# Patient Record
Sex: Female | Born: 1945 | Race: White | Hispanic: No | Smoking: Former smoker
Health system: Southern US, Community
[De-identification: ages and names within clinical notes are randomized; demographics above are authoritative.]

## PROBLEM LIST (undated history)

## (undated) DIAGNOSIS — I714 Abdominal aortic aneurysm, without rupture, unspecified: Secondary | ICD-10-CM

## (undated) DIAGNOSIS — Z1231 Encounter for screening mammogram for malignant neoplasm of breast: Secondary | ICD-10-CM

## (undated) DIAGNOSIS — R0600 Dyspnea, unspecified: Secondary | ICD-10-CM

## (undated) DIAGNOSIS — L0390 Cellulitis, unspecified: Secondary | ICD-10-CM

## (undated) DIAGNOSIS — I872 Venous insufficiency (chronic) (peripheral): Secondary | ICD-10-CM

## (undated) DIAGNOSIS — J449 Chronic obstructive pulmonary disease, unspecified: Secondary | ICD-10-CM

## (undated) DIAGNOSIS — I480 Paroxysmal atrial fibrillation: Secondary | ICD-10-CM

## (undated) HISTORY — DX: Venous insufficiency (chronic) (peripheral): I87.2

## (undated) HISTORY — PX: VASCULAR SURGERY: SHX849

## (undated) HISTORY — DX: Chronic obstructive pulmonary disease, unspecified: J44.9

---

## 2000-08-23 ENCOUNTER — Encounter: Payer: Self-pay | Admitting: Emergency Medicine

## 2000-08-24 ENCOUNTER — Encounter: Payer: Self-pay | Admitting: Surgery

## 2000-08-24 ENCOUNTER — Inpatient Hospital Stay (HOSPITAL_COMMUNITY): Admission: EM | Admit: 2000-08-24 | Discharge: 2000-08-27 | Payer: Self-pay | Admitting: Emergency Medicine

## 2000-08-26 ENCOUNTER — Encounter: Payer: Self-pay | Admitting: Surgery

## 2010-12-29 MED ORDER — HYDROCODONE-ACETAMINOPHEN 7.5-750 MG PO TABS
ORAL_TABLET | Freq: Three times a day (TID) | ORAL | Status: DC | PRN
Start: 2010-12-29 — End: 2011-01-20
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
## 2011-01-26 MED ORDER — HYDROCODONE-ACETAMINOPHEN 7.5-750 MG PO TABS
ORAL_TABLET | Freq: Three times a day (TID) | ORAL | Status: DC | PRN
Start: 2011-01-26 — End: 2011-01-26

## 2011-01-29 MED ORDER — HYDROCODONE-ACETAMINOPHEN 7.5-750 MG PO TABS
ORAL_TABLET | Freq: Three times a day (TID) | ORAL | Status: DC | PRN
Start: 2011-01-29 — End: 2011-02-12

## 2011-01-29 NOTE — Progress Notes (Signed)
01/29/2011    Jean Ball    Last visit to Pain Management?  12-29-10    Any new medical conditions since your last visit?  no    If yes, explain:     Any new medications since your last visit? no  (See updated med summary sheet)    Describe your pain:  (Location, type, intensity, duration)  Left knee pain    Pain is a # 8 on the VAS Scale.    Comments:  Uses cane for ambulation  And wearing knee brace    When was the last time you took your pain medication?  vicodin THIS AM    BP 152/89   Pulse 70   Temp(Src) 98.2 ??F (36.8 ??C) (Oral)   Resp 16    [name], LPN     11/28/1094    Jean Ball    HISTORY OF PRESENT ILLNESS:  The patient presents today the [facility] 01/29/2011 for continued evaluation of:    Patient Active Problem List:     Medial meniscus tear, Left knee     Lateral meniscus tear, Left knee     Left knee DJD     Chondromalacia of patella, left     Sprain and strain of knee and leg          Medications: Current outpatient prescriptions:hydrocodone-acetaminophen (VICODIN ES) 7.5-750 MG per tablet, Take 1 tablet by mouth every 8 hours as needed., Disp: 90 tablet, Rfl: 0;  aspirin 325 MG tablet, Take 325 mg by mouth daily.  , Disp: , Rfl: ;  Multiple Vitamins-Minerals (ONE-A-DAY WOMENS 50 PLUS) TABS, Take 1 tablet by mouth daily.  , Disp: , Rfl: ;  Flaxseed, Linseed, (FLAX SEED OIL) 1000 MG CAPS, Take 1 tablet by mouth daily.  , Disp: , Rfl:         The patient???s Review of Systems, Family History, Medical History, and Social History were reviewed with the patient and other than the above changes listed there is no difference from the previous exam  12/29/10     Medications and allergies have been reviewed in detail with the patient.  There are no changes here either.  Please see the medication reconciliation sheet in the chart for full details of medications and allergies.    PHYSICAL EXAMINATION:    VITAL SIGNS: BP 152/89   Pulse 70   Temp(Src) 98.2 ??F (36.8 ??C) (Oral)   Resp  16  Neck: no adenopathy, no carotid bruit, no JVD, supple, symmetrical, trachea midline and thyroid not enlarged, symmetric, no tenderness/mass/nodules   Back: symmetric, no curvature. ROM normal. No CVA tenderness.   Lungs: clear to auscultation bilaterally   Heart: regular rate and rhythm, S1, S2 normal, no murmur, click, rub or gallop   Abdomen: soft, non-tender; bowel sounds normal; no masses, no organomegaly   Extremities: edema left knee, has hinged brace on left leg, is able to almost fully extend left leg but flexion of left knee is limited. tender to palpation in medial aspect left knee.   Pulses:2+ and symmetric   Neurologic: antalgic gait. Patient is alert and oriented times three. Motor:Motor exam is 5 out of 5 all extremities with the exception of left lower extremity.Sensation is intact to light touch throughout in the upper and lower extremities.         Patient Active Problem List:     Medial meniscus tear, Left knee     Lateral meniscus tear, Left knee  Left knee DJD     Chondromalacia of patella, left     Sprain and strain of knee and leg         PLAN:      Medications:    Orders Placed This Encounter   Medications   ??? hydrocodone-acetaminophen (VICODIN ES) 7.5-750 MG per tablet     Sig: Take 1 tablet by mouth every 8 hours as needed.     Dispense:  90 tablet     Refill:  0   1.    2.   3. Ongoing Care:  The patient???s chronic opioid agreement was reviewed and the patient shows no evidence of non-compliance.  We will see the patient in 1 month or sooner if the patient has any problems before that time.    Merriel Zinger D. Brianna Esson, D.O.

## 2011-02-24 MED ORDER — HYDROCODONE-ACETAMINOPHEN 7.5-750 MG PO TABS
ORAL_TABLET | Freq: Three times a day (TID) | ORAL | Status: DC | PRN
Start: 2011-02-24 — End: 2011-03-19

## 2011-02-24 NOTE — Progress Notes (Signed)
02/24/2011    Jean Ball    Last visit to Pain Management?  01/29/2011    Any new medical conditions since your last visit?  No new medical problems    If yes, explain:  n/a    Any new medications since your last visit? No new medications  (See updated med summary sheet)    Describe your pain:  (Location, type, intensity, duration)  Left knee    Pain is a 8 on the VAS Scale.    Do you feel your functional level is improved while on opioid medication?  Yes, about 30-40% better    Comments:  Ambulating with a straight cane, wearing left knee brace.  Denies any paresthesias    When was the last time you took your pain medication?  Last night, Vicodin      98.2 temp -72 pulse-16 Resp    179/90      L. Steinbeck R.N.     02/24/2011    Jean Ball    HISTORY OF PRESENT ILLNESS:  The patient presents today the [name] 02/24/2011 for continued evaluation of:    Patient Active Problem List:     Medial meniscus tear, Left knee     Lateral meniscus tear, Left knee     Left knee DJD     Chondromalacia of patella, left     Sprain and strain of knee and leg      Subjective  She is here for eveluation of hr chronic pain. 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  There is nothing laft for it othr than controllong the pain with medications.  She does tell me that she does do excercises every day.  She does have her knee in a brace and she uses a cane.  She denies any side effects with her medciations.  With out it she would not be able walk to the mail box    Medications: Current outpatient prescriptions:hydrocodone-acetaminophen (VICODIN ES) 7.5-750 MG per tablet, Take 1 tablet by mouth every 8 hours as needed for 30 days. DO NOT FILL UNTIL 02-28-11, Disp: 90 tablet, Rfl: 0;  aspirin 325 MG tablet, Take 325 mg by mouth daily.  , Disp: , Rfl: ;  Multiple Vitamins-Minerals (ONE-A-DAY WOMENS 50 PLUS) TABS, Take 1 tablet by mouth daily.  , Disp: , Rfl:   Flaxseed, Linseed, (FLAX SEED OIL)  1000 MG CAPS, Take 1 tablet by mouth daily.  , Disp: , Rfl:         The patient???s Review of Systems, Family History, Medical History, and Social History were reviewed with the patient and other than the above changes listed there is no difference from the previous exam  01/29/11     Medications and allergies have been reviewed in detail with the patient.  There are no changes here either.  Please see the medication reconciliation sheet in the chart for full details of medications and allergies.    PHYSICAL EXAMINATION:    VITAL SIGNS: BP 178/90   Pulse 72   Temp(Src) 98.2 ??F (36.8 ??C) (Tympanic)   Resp 16  Neck: no adenopathy, no carotid bruit, no JVD, supple, symmetrical, trachea midline and thyroid not enlarged, symmetric, no tenderness/mass/nodules   Back: symmetric, no curvature. ROM normal. No CVA tenderness.   Lungs: clear to auscultation bilaterally   Heart: regular rate and rhythm, S1, S2 normal, no murmur, click, rub or gallop   Abdomen: soft, non-tender;  bowel sounds normal; no masses, no organomegaly   Extremities: edema left knee, has hinged brace on left leg, is able to almost fully extend left leg but flexion of left knee is limited. tender to palpation in medial aspect left knee.   Pulses:2+ and symmetric   Neurologic: antalgic gait. Patient is alert and oriented times three. Motor:Motor exam is 5 out of 5 all extremities with the exception of left lower extremity.Sensation is intact to light touch throughout in the upper and lower extremities.             Patient Active Problem List:     Medial meniscus tear, Left knee     Lateral meniscus tear, Left knee     Left knee DJD     Chondromalacia of patella, left     Sprain and strain of knee and leg         PLAN:      Medications:    Orders Placed This Encounter   Medications   ??? hydrocodone-acetaminophen (VICODIN ES) 7.5-750 MG per tablet     Sig: Take 1 tablet by mouth every 8 hours as needed for 30 days. DO NOT FILL UNTIL 02-28-11     Dispense:  90  tablet     Refill:  0   1.    2.   3. Ongoing Care:  The patient???s chronic opioid agreement was reviewed and the patient shows no evidence of non-compliance.  We will see the patient in 1 month or sooner if the patient has any problems before that time.    Barnaby Rippeon D. Siriyah Ambrosius, D.O.

## 2011-03-06 LAB — CBC
HCT: 47.5 % (ref 34.0–48.0)
HGB: 16 g/dL — ABNORMAL HIGH (ref 11.5–15.5)
MCH: 32.4 pg (ref 26.0–35.0)
MCHC: 33.7 % (ref 32.0–34.5)
MCV: 96.3 fL (ref 80.0–99.9)
MPV: 12.7 fL — ABNORMAL HIGH (ref 7.0–12.0)
Platelets: 126 E9/L — ABNORMAL LOW (ref 130–450)
RBC: 4.94 E12/L (ref 3.50–5.50)
RDW: 13.4 fL (ref 11.5–15.0)
WBC: 5.8 E9/L (ref 4.5–11.5)

## 2011-03-06 LAB — LIPID PANEL
Cholesterol: 224 mg/dL — ABNORMAL HIGH (ref 0–199)
HDL: 48 mg/dL (ref >40.0)
LDL Calculated: 153 mg/dL — ABNORMAL HIGH (ref 0–99)
Triglycerides: 114 mg/dL (ref 0–149)

## 2011-03-06 LAB — COMPREHENSIVE METABOLIC PANEL
ALT: 19 U/L (ref 10–49)
AST: 25 U/L (ref 0–33)
Albumin: 4.1 g/dL (ref 3.2–4.8)
Alkaline Phosphatase: 99 U/L (ref 45–129)
BUN: 13 mg/dL (ref 9–23)
Bilirubin, Total: 0.6 mg/dL (ref 0.3–1.2)
CO2: 32 mmol/L — ABNORMAL HIGH (ref 20–31)
Calcium: 8.8 mg/dL (ref 8.6–10.5)
Chloride: 111 mmol/L — ABNORMAL HIGH (ref 99–109)
Creatinine: 0.6 mg/dL (ref 0.5–1.1)
Glucose: 87 mg/dL (ref 70–110)
Potassium: 4.3 mmol/L (ref 3.5–5.5)
Sodium: 145 mmol/L (ref 132–146)
Total Protein: 6.5 g/dL (ref 5.7–8.2)

## 2011-03-06 LAB — CA 125: CA 125: 7.1 U/mL (ref 0.0–30.1)

## 2011-03-06 LAB — GFR CALCULATED: Gfr Calculated: >60 mL/min/{1.73_m2} (ref >=60)

## 2011-03-06 LAB — PLATELET CONFIRMATION

## 2011-03-06 LAB — TSH: TSH: 1.321 u[IU]/mL (ref 0.350–5.000)

## 2011-03-07 LAB — VITAMIN D 25 HYDROXY: Vit D, 25-Hydroxy: 16 ng/mL — ABNORMAL LOW (ref 30–80)

## 2011-03-26 MED ORDER — HYDROCODONE-ACETAMINOPHEN 7.5-750 MG PO TABS
ORAL_TABLET | Freq: Three times a day (TID) | ORAL | Status: DC | PRN
Start: 2011-03-26 — End: 2011-04-01

## 2011-03-26 NOTE — Progress Notes (Signed)
03/26/2011    Jean Ball    Last visit to Pain Management?  02/24/2011    Any new medical conditions since your last visit?  No new medical problems    If yes, explain:  n/a    Any new medications since your last visit? No new medications  (See updated med summary sheet)    Describe your pain:  (Location, type, intensity, duration)  Left knee    Pain is a 9 on the VAS Scale.    Do you feel your functional level is improved while on opioid medication? Yes, "quite a bit"    Comments:  Ambulating with a staright cane wearing a knee brace.  Denies any paresthesias    When was the last time you took your pain medication?  Yesterday, Vicodin    BP 146/76   Pulse 72   Temp(Src) 98.1 ??F (36.7 ??C) (Oral)   Resp 16      L. Steinbeck RN         Bekki Tavenner    03/26/2011    HISTORY OF PRESENT ILLNESS:  The patient presents today the [name] today, 03/26/2011 for continued evaluation of   Patient Active Problem List   Diagnoses   ??? Medial meniscus tear, Left knee   ??? Lateral meniscus tear, Left knee   ??? Left knee DJD   ??? Chondromalacia of patella, left   ??? Sprain and strain of knee and leg   .    Subjective: See above note. The patient states it is aggravated with activity and controlled with the current pain regimen which includes Vicodin ES and is prescribed by this office. The patient denies any side effects or complications from the medications. The patient denies any loss of bowel or bladder, new neurological symptoms or interval weakness.    The patient???s Review of Systems, Family History, Medical History, and Social History were reviewed with the patient and other than the above changes listed there is no difference from the previous exam.       Medications and allergies have been reviewed in detail with the patient.  There are no changes here either.  Please see the medication reconciliation sheet in the chart for full details of medications and allergies.    PHYSICAL EXAMINATION:    VITAL SIGNS: BP  146/76   Pulse 72   Temp(Src) 98.1 ??F (36.7 ??C) (Oral)   Resp 16      Neck: no adenopathy, no carotid bruit, no JVD, supple, symmetrical, trachea midline and thyroid not enlarged, symmetric, no tenderness/mass/nodules  Back: symmetric, no curvature. ROM normal. No CVA tenderness.  Lungs: clear to auscultation bilaterally  Heart: regular rate and rhythm, S1, S2 normal, no murmur, click, rub or gallop  Abdomen: soft, non-tender; bowel sounds normal; no masses,  no organomegaly  Extremities: brace on left leg, edema left ankle and knee with tenderness to palpation  Pulses:2+ and symmetric   Neurologic: uses cane for ambulation. Patient is alert and oriented times three. Motor:Motor exam is 5 out of 5 all extremities with the exception of left lower extremity is 4/5. Sensation is intact to light touch throughout in the upper and lower extremities.     Patient Active Problem List:     Medial meniscus tear, Left knee     Lateral meniscus tear, Left knee     Left knee DJD     Chondromalacia of patella, left     Sprain and strain of knee and  leg         PLAN:       1.  Medications: The patient's opioid agreement was reviewed.The patient shows no evidence of non-compliance. We did provide the patient with a refill of   Orders Placed This Encounter   Medications   ??? hydrocodone-acetaminophen (VICODIN ES) 7.5-750 MG per tablet     Sig: Take 1 tablet by mouth every 8 hours as needed for 30 days. DO NOT FILL UNTIL 03-30-11     Dispense:  90 tablet     Refill:  0     2.   Imaging:  3.  Physical Therapy:  4.  Interventions:  5.  Referrals:  6.  Ongoing Care: We will see the patient in 1 month or sooner if they have any problems before that time. I encouraged the patient to call our office with any questions.    Monika Salk, RNCNP

## 2011-04-01 MED ORDER — HYDROCODONE-ACETAMINOPHEN 7.5-325 MG PO TABS
ORAL_TABLET | Freq: Three times a day (TID) | ORAL | Status: DC | PRN
Start: 2011-04-01 — End: 2011-04-21

## 2011-04-01 NOTE — Telephone Encounter (Signed)
[facility] [phone] called and stated patient worker comp will not cover > 500 acetaminophen per tablet.  Requesting alternative medication.  Spoke to [facility] NP and ok for [employer].  Pharmacy notified

## 2011-04-27 MED ORDER — HYDROCODONE-ACETAMINOPHEN 7.5-325 MG PO TABS
ORAL_TABLET | Freq: Three times a day (TID) | ORAL | Status: DC | PRN
Start: 2011-04-27 — End: 2011-05-21

## 2011-04-27 MED ORDER — DICLOFENAC SODIUM 1.5 % TD SOLN
1.5 % | Freq: Two times a day (BID) | TRANSDERMAL | Status: AC | PRN
Start: 2011-04-27 — End: 2011-05-27

## 2011-04-27 NOTE — Progress Notes (Signed)
04/27/2011    Jean Ball    Last visit to Pain Management?  03/26/2011    Any new medical conditions since your last visit?  No new medical problems    If yes, explain:  n/a    Any new medications since your last visit? No new medications  (See updated med summary sheet)    Describe your pain:  (Location, type, intensity, duration)  Left knee    Pain is a 8 on the VAS Scale.    Do you feel your functional level is improved while on opioid medication? "No, the other ones were better"    Comments:  Ambulating with a straight cane wearing a left knee brace    When was the last time you took your pain medication?  Today, Norco    BP 154/80   Pulse 68   Temp 98.3 ??F (36.8 ??C)   Resp 18      L. Steinbeck RN         HISTORY OF PRESENT ILLNESS:  The patient presents at the [facility] today, 04/27/2011, for continued evaluation of:  Patient Active Problem List   Diagnoses   ??? Medial meniscus tear, Left knee   ??? Lateral meniscus tear, Left knee   ??? Left knee DJD   ??? Chondromalacia of patella, left   ??? Sprain and strain of knee and leg       SUBJECTIVE: The patient complains of the above.  The patient denies any side effects or complications from the medications. The patient denies any loss of bowel or bladder, new neurological symptoms or interval weakness.    REVIEW OF SYSTEMS: Family History, Medical History, and Social History were reviewed with the patient and other than the above changes listed there is no difference from the previous exam.     Medications and allergies have been reviewed in detail with the patient.  There are no changes here either.  Please see the medication reconciliation sheet in the chart for full details of medications and allergies.    Current outpatient prescriptions:HYDROcodone-acetaminophen (NORCO) 7.5-325 MG per tablet, Take 1 tablet by mouth every 8 hours as needed for Pain for 30 days. DO NOT FILL UNTIL 05/01/2011., Disp: 90 tablet, Rfl: 0;  Diclofenac Sodium (PENNSAID)  1.5 % SOLN, Place 40 drops onto the skin 2 times daily as needed for 30 days., Disp: 3 Bottle, Rfl: 3;  aspirin 325 MG tablet, Take 325 mg by mouth daily.  , Disp: , Rfl:   Multiple Vitamins-Minerals (ONE-A-DAY WOMENS 50 PLUS) TABS, Take 1 tablet by mouth daily.  , Disp: , Rfl: ;  Flaxseed, Linseed, (FLAX SEED OIL) 1000 MG CAPS, Take 1 tablet by mouth daily.  , Disp: , Rfl:     PHYSICAL EXAMINATION:    VITAL SIGNS: BP 154/80   Pulse 68   Temp 98.3 ??F (36.8 ??C)   Resp 18    Neck: no adenopathy, no carotid bruit, no JVD, supple, symmetrical, trachea midline and thyroid not enlarged, symmetric, no tenderness/mass/nodules    Back: full range of motion without pain, no tenderness, no spasm, no curvature     Lungs: clear to auscultation bilaterally    Heart: regular rate and rhythm, S1, S2 normal, no murmur, click, rub or gallop    Abdomen: soft, non-tender; bowel sounds normal; no masses,  no organomegaly    Extremities: Tenderness to palpation along the medial aspect of left knee with crepitus noted upon flexion and extension. ROM  markedly decreased. Presents with brace on today    Pulses: Peripheral vascular pulses were palpable bilaterally.     Neurologic: Patient is alert and oriented times three. Cranial nerves 2-12 are grossly intact.    Psychiatric: normal perception    Motor: Muscle strength is 5/5 bilaterally in the upper and lower extremities. Sensation is intact to light touch throughout in the upper and lower extremities.     Palpation:    Facet loading of the cervical spine does not reproduce the patient's pain.  Cervical paraspinal musculature bilateral is not tender to palpation and without radiation..     Facet loading of the lumbar spine does not reproduce the patient's pain.  Lumbar paraspinal musculature bilateral is not tender to palpation and does not radiate.    Sacroiliac joints bilateral are negative for tenderness to palpation. bilateral upper trochanterics are negative for tenderness upon  palpation.     Patient Active Problem List   Diagnoses   ??? Medial meniscus tear, Left knee   ??? Lateral meniscus tear, Left knee   ??? Left knee DJD   ??? Chondromalacia of patella, left   ??? Sprain and strain of knee and leg        PLAN:                   1. Medications: The patient's opioid agreement was reviewed. The patient shows               no evidence of non-compliance. We did provide the patient with refills of                              Orders Placed This Encounter   Medications   ??? HYDROcodone-acetaminophen (NORCO) 7.5-325 MG per tablet     Sig: Take 1 tablet by mouth every 8 hours as needed for Pain for 30 days. DO NOT FILL UNTIL 05/01/2011.     Dispense:  90 tablet     Refill:  0   ??? Diclofenac Sodium (PENNSAID) 1.5 % SOLN     Sig: Place 40 drops onto the skin 2 times daily as needed for 30 days.     Dispense:  3 Bottle     Refill:  3   .           I did give her a sample bottle of Pennsaid to try along with information regarding how to apply               2.  Imaging:  N/A unless noted otherwise         3.  Physical Therapy:  N/A unless noted otherwise         4.  Interventions:  N/A unless noted otherwise         5.  Referrals:  N/A unless noted otherwise         6.  Ongoing Care:  We will see the patient in the office in 1 month or sooner if the patient has any problems before that time.  I                           encouraged them to call our office with any questions.  Electronically signed by Pleas Koch

## 2011-05-04 LAB — VITAMIN D 25 HYDROX, D2 & D3: Vitamin D2 And D3, Total: 49 ng/mL (ref 30–100)

## 2011-05-25 MED ORDER — HYDROCODONE-ACETAMINOPHEN 7.5-325 MG PO TABS
ORAL_TABLET | Freq: Three times a day (TID) | ORAL | Status: DC | PRN
Start: 2011-05-25 — End: 2011-06-28

## 2011-05-25 NOTE — Progress Notes (Signed)
05/25/2011    Jean Ball    Last visit to Pain Management?  04/27/2011    Any new medical conditions since your last visit?  yes    If yes, explain:  Has a cold    Any new medications since your last visit?  Taking OTC Contact, Formula 44 Cough, cough drops and Bayer Aspirin for her cold  (See updated med summary sheet)    Describe your pain:  (Location, type, intensity, duration)  Left knee    Pain is a 8 on the VAS Scale.    Do you feel your functional level is improved while on opioid medication? " Yes"    Comments:  Wearing a left knee brace, ambulating with a straight cane.  Complains of paresthesias below left knee    When was the last time you took your pain medication?  This AM, Norco    BP 154/68   Pulse 72   Temp(Src) 98.3 ??F (36.8 ??C) (Oral)   Resp 18      L. Steinbeck RN         HISTORY OF PRESENT ILLNESS:  The patient presents at the [facility]. [facility] today, 05/25/2011, for continued evaluation of:  Patient Active Problem List   Diagnoses   ??? Medial meniscus tear, Left knee   ??? Lateral meniscus tear, Left knee   ??? Left knee DJD   ??? Chondromalacia of patella, left   ??? Sprain and strain of knee and leg       SUBJECTIVE: The patient complains of the above.  The patient denies any side effects or complications from the medications. The patient denies any loss of bowel or bladder, new neurological symptoms or interval weakness.    REVIEW OF SYSTEMS: Family History, Medical History, and Social History were reviewed with the patient and other than the above changes listed there is no difference from the previous exam.     Medications and allergies have been reviewed in detail with the patient.  There are no changes here either.  Please see the medication reconciliation sheet in the chart for full details of medications and allergies.    Current outpatient prescriptions:HYDROcodone-acetaminophen (NORCO) 7.5-325 MG per tablet, Take 1 tablet by mouth every 8 hours as needed for Pain for 30 days. DO  NOT FILL UNTIL 05/30/2011., Disp: 90 tablet, Rfl: 0;  Diclofenac Sodium (PENNSAID) 1.5 % SOLN, Place 40 drops onto the skin 2 times daily as needed for 30 days., Disp: 3 Bottle, Rfl: 3;  aspirin 325 MG tablet, Take 325 mg by mouth daily.  , Disp: , Rfl:   Multiple Vitamins-Minerals (ONE-A-DAY WOMENS 50 PLUS) TABS, Take 1 tablet by mouth daily.  , Disp: , Rfl: ;  Flaxseed, Linseed, (FLAX SEED OIL) 1000 MG CAPS, Take 1 tablet by mouth daily.  , Disp: , Rfl:     PHYSICAL EXAMINATION:    VITAL SIGNS: BP 154/68   Pulse 72   Temp(Src) 98.3 ??F (36.8 ??C) (Oral)   Resp 18    Neck: no adenopathy, no carotid bruit, no JVD, supple, symmetrical, trachea midline and thyroid not enlarged, symmetric, no tenderness/mass/nodules    Back: full range of motion without pain, no tenderness, no spasm, no curvature     Lungs: clear to auscultation bilaterally    Heart: regular rate and rhythm, S1, S2 normal, no murmur, click, rub or gallop    Abdomen: soft, non-tender; bowel sounds normal; no masses,  no organomegaly    Extremities: edema  left knee  and presents with brace on left knee    Pulses: Peripheral vascular pulses were palpable bilaterally.     Neurologic: Patient is alert and oriented times three. Cranial nerves 2-12 are grossly intact.    Psychiatric: normal perception    Motor: Muscle strength is 5/5 bilaterally in the upper and lower extremities. Sensation is intact to light touch throughout in the upper and lower extremities.     Palpation:    Facet loading of the cervical spine does not reproduce the patient's pain.  Cervical paraspinal musculature bilateral is not tender to palpation and without radiation..     Facet loading of the lumbar spine does not reproduce the patient's pain.  Lumbar paraspinal musculature bilateral is not tender to palpation and does not radiate.    Sacroiliac joints bilateral are negative for tenderness to palpation. bilateral upper trochanterics are negative for tenderness upon palpation.      Patient Active Problem List   Diagnoses   ??? Medial meniscus tear, Left knee   ??? Lateral meniscus tear, Left knee   ??? Left knee DJD   ??? Chondromalacia of patella, left   ??? Sprain and strain of knee and leg        PLAN:                   1. Medications: The patient's opioid agreement was reviewed. The patient shows               no evidence of non-compliance. We did provide the patient with refills of                              Orders Placed This Encounter   Medications   ??? HYDROcodone-acetaminophen (NORCO) 7.5-325 MG per tablet     Sig: Take 1 tablet by mouth every 8 hours as needed for Pain for 30 days. DO NOT FILL UNTIL 05/30/2011.     Dispense:  90 tablet     Refill:  0   .                          2.  Imaging:  N/A unless noted otherwise         3.  Physical Therapy:  N/A unless noted otherwise         4.  Interventions:  N/A unless noted otherwise         5.  Referrals:  N/A unless noted otherwise         6.  Ongoing Care:  We will see the patient in the office in 1 month or sooner if the patient has any problems before that time.  I                           encouraged them to call our office with any questions.                                                                   Electronically signed by Nehemiah Settle

## 2011-06-29 MED ORDER — HYDROCODONE-ACETAMINOPHEN 7.5-325 MG PO TABS
ORAL_TABLET | Freq: Three times a day (TID) | ORAL | Status: DC | PRN
Start: 2011-06-29 — End: 2011-08-04

## 2011-06-29 NOTE — Progress Notes (Addendum)
06/29/2011    Jean Ball    Patients last visit to Pain Management was  05/25/11.  States no  new medical conditions/changes since last visit.  Complaints of pain to left knee with swelling to left knee and foot .  States pain level is 8.   No medication changes.  Took Norco this am.  States when applies Pennsaid to left knee states itches to knee and bilateral lower leg.  States last couple hours. Denies hives or difficulty breathing    Do you feel your functional level is improved while on opioid medication? yes      BP 168/80  Pulse 74  Temp(Src) 98.3 F (36.8 C) (Oral)  Resp 18     HISTORY OF PRESENT ILLNESS:  The patient presents at the [facility]. [facility] today, 06/29/2011, for continued evaluation of:  Patient Active Problem List   Diagnoses   . Medial meniscus tear, Left knee   . Lateral meniscus tear, Left knee   . Left knee DJD   . Chondromalacia of patella, left   . Sprain and strain of knee and leg       SUBJECTIVE: The patient complains of the above.  The patient denies any side effects or complications from the medications. The patient denies any loss of bowel or bladder, new neurological symptoms or interval weakness.  She would like to see Dr Christoper Allegra in [place] for a 2nd opinion on her left knee. We will submit a C-9 for this.She is in constant pain.  REVIEW OF SYSTEMS: Family History, Medical History, and Social History were reviewed with the patient and other than the above changes listed there is no difference from the previous exam.     Medications and allergies have been reviewed in detail with the patient.  There are no changes here either.  Please see the medication reconciliation sheet in the chart for full details of medications and allergies.    Current outpatient prescriptions:HYDROcodone-acetaminophen (NORCO) 7.5-325 MG per tablet, Take 1 tablet by mouth every 8 hours as needed for Pain for 30 days., Disp: 90 tablet, Rfl: 0;  Diclofenac Sodium (PENNSAID) 1.5 %  SOLN, Place 40 drops onto the skin 2 times daily as needed for 30 days., Disp: 3 Bottle, Rfl: 3;  aspirin 325 MG tablet, Take 325 mg by mouth daily.  , Disp: , Rfl:   Multiple Vitamins-Minerals (ONE-A-DAY WOMENS 50 PLUS) TABS, Take 1 tablet by mouth daily.  , Disp: , Rfl: ;  Flaxseed, Linseed, (FLAX SEED OIL) 1000 MG CAPS, Take 1 tablet by mouth daily.  , Disp: , Rfl:     PHYSICAL EXAMINATION:    VITAL SIGNS: BP 168/80  Pulse 74  Temp(Src) 98.3 F (36.8 C) (Oral)  Resp 18    Neck: no adenopathy, no carotid bruit, no JVD, supple, symmetrical, trachea midline and thyroid not enlarged, symmetric, no tenderness/mass/nodules    Back: full range of motion without pain, no tenderness, no spasm, no curvature     Lungs: clear to auscultation bilaterally    Heart: regular rate and rhythm, S1, S2 normal, no murmur, click, rub or gallop    Abdomen: soft, non-tender; bowel sounds normal; no masses,  no organomegaly    Extremities: left knee has surgical scarring, swelling, tenderness to palpation over the patella, presents with brace. Ambulates with can assistance.    Pulses: Peripheral vascular pulses were palpable bilaterally.     Neurologic: Patient is alert and oriented times three. Cranial nerves 2-12  are grossly intact.    Psychiatric: normal perception    Motor: Muscle strength is 5/5 bilaterally in the upper and lower extremities. Sensation is intact to light touch throughout in the upper and lower extremities.     Palpation:    Facet loading of the cervical spine does not reproduce the patient's pain.  Cervical paraspinal musculature bilateral is not tender to palpation and without radiation..     Facet loading of the lumbar spine does not reproduce the patient's pain.  Lumbar paraspinal musculature bilateral is not tender to palpation and does not radiate.    Sacroiliac joints bilateral are negative for tenderness to palpation. bilateral upper trochanterics are negative for tenderness upon palpation.     Patient  Active Problem List   Diagnoses   . Medial meniscus tear, Left knee   . Lateral meniscus tear, Left knee   . Left knee DJD   . Chondromalacia of patella, left   . Sprain and strain of knee and leg        PLAN:                   1. Medications: The patient's opioid agreement was reviewed. The patient shows               no evidence of non-compliance. We did provide the patient with refills of                              Orders Placed This Encounter   Medications   . HYDROcodone-acetaminophen (NORCO) 7.5-325 MG per tablet     Sig: Take 1 tablet by mouth every 8 hours as needed for Pain for 30 days.     Dispense:  90 tablet     Refill:  0   .          She was told to stop the Pennsaid due to the itching.                2.  Imaging:  N/A unless noted otherwise         3.  Physical Therapy:  N/A unless noted otherwise         4.  Interventions:  N/A unless noted otherwise         5.  Referrals:  C-9 for referral to Dr Christoper Allegra (ortho in [place])         6.  Ongoing Care:  We will see the patient in the office in 1 month or sooner if the patient has any problems before that time.  I                           encouraged them to call our office with any questions.                                                                   Electronically signed by Nehemiah Settle

## 2011-07-29 NOTE — Progress Notes (Signed)
I faxed all information to Dr. Theodosia Paling office, appointment was made for October 5 th at 10:15am.  (P) [phone], (F) [phone].  Patient must first have a X-Ray of (L) knee done.  Patient was notified of all this and is having X-Ray done at [facility]. [facility].

## 2011-08-04 LAB — HEPATIC FUNCTION PANEL
ALT: 19 U/L (ref 10–49)
AST: 20 U/L (ref 0–33)
Albumin: 4.5 g/dL (ref 3.2–4.8)
Alkaline Phosphatase: 84 U/L (ref 45–129)
Bilirubin, Direct: 0.2 mg/dL (ref 0.0–0.2)
Total Bilirubin: 0.5 mg/dL (ref 0.3–1.2)
Total Protein: 6.8 g/dL (ref 5.7–8.2)

## 2011-08-04 LAB — CREATININE: Creatinine: 0.7 mg/dL (ref 0.5–1.1)

## 2011-08-04 LAB — ETHANOL: Ethanol Lvl: <10 mg/dL

## 2011-08-04 LAB — GFR CALCULATED: Gfr Calculated: >60 mL/min/{1.73_m2} (ref >=60)

## 2011-08-04 LAB — BUN: BUN: 17 mg/dL (ref 9–23)

## 2011-08-04 MED ORDER — HYDROCODONE-ACETAMINOPHEN 7.5-325 MG PO TABS
ORAL_TABLET | Freq: Three times a day (TID) | ORAL | Status: DC | PRN
Start: 2011-08-04 — End: 2011-08-25

## 2011-08-04 NOTE — Progress Notes (Signed)
08/04/2011    Jean Ball    Last visit to Pain Management?  06-29-11    Any new medical conditions since your last visit?  Yes,left cataract surgery 07-28-11    If yes, explain:  needs left knee x-ray taken prior to consult with DR.Stahlbergs  08-28-11 @ [facility]    Any new medications since your last visit? no  (See updated med summary sheet)    Describe your pain:  (Location, type, intensity, duration)  Left knee pain also into left calf and foot    Pain is a # 6 on the VAS Scale.    Do you feel your functional level is improved while on opioid medication? yes    Comments:  Uses cane     When was the last time you took your pain medication?  This am    BP 154/82  Pulse 68  Temp 98.1 F (36.7 C)  Resp 16    LP LPN

## 2011-08-04 NOTE — Progress Notes (Signed)
Jean Ball    08/04/2011    [employer]    HISTORY OF PRESENT ILLNESS:  Jean Ball comes to the [facility] today, 08/04/2011 for continued evaluation of the aforementioned chief complaint.  The patient denies interval weakness, new bowel or bladder complaints or any new neurologic complaints.     Jean Ball continues to report constant aching pain in the left knee.  She reports the pain radiates into the distal medial left extremity. The pain is aggravated with activity and alleviated with the current medication regimen. The patient continues to note improved quality of life and level of function with this regimen. She denies any other associated symptoms.    07/29/2011 per Gregary Cromer, Secretary:  I faxed all information to Dr. Theodosia Paling office, appointment was made for October 5 th at 10:15am.  (P) [phone], (F) [phone].  Patient must first have a X-Ray of (L) knee done.  Patient was notified of all this and is having X-Ray done at [facility]. [facility].    Jean Ball was last seen on 06/29/2011 at which time she complained of pain to the left knee and swelling to the left knee and and left foot .  She states when she applied Pennsaid to the left knee it caused itching to the knee and bilateral lower leg which lasted a couple of hours. She denied hives or difficulty breathing.  She was told to discontinue the Pennsaid.  She also requested to see Dr. Christoper Allegra in [place] for a 2nd opinion on her left knee.  We will submitted a C-9 for this.    PERTINENT FAMILY HISTORY:    Her family history includes Cancer in an unspecified family member.    PAST MEDICAL HISTORY:  Dan  has no past medical history on file.    PAST SURGICAL HISTORY:  The patient  has past surgical history that includes knee surgery; Total knee arthroplasty (2007); Total knee arthroplasty (2008); back surgery (1990); and Tubal ligation.     MEDICATIONS:  Current outpatient  prescriptions:HYDROcodone-acetaminophen (NORCO) 7.5-325 MG per tablet, Take 1 tablet by mouth every 8 hours as needed for Pain for 30 days., Disp: 90 tablet, Rfl: 0;  Diclofenac Sodium (PENNSAID) 1.5 % SOLN, Place 40 drops onto the skin 2 times daily as needed for 30 days., Disp: 3 Bottle, Rfl: 3;  aspirin 325 MG tablet, Take 325 mg by mouth daily.  , Disp: , Rfl:   Multiple Vitamins-Minerals (ONE-A-DAY WOMENS 50 PLUS) TABS, Take 1 tablet by mouth daily.  , Disp: , Rfl: ;  Flaxseed, Linseed, (FLAX SEED OIL) 1000 MG CAPS, Take 1 tablet by mouth daily.  , Disp: , Rfl:     ALLERGIES:  Sayde is allergic to keflex.    SOCIAL HISTORY:  Jean Ball  reports that she has been smoking Cigarettes.  She has a 30 pack-year smoking history. She does not have any smokeless tobacco history on file. She reports that she drinks alcohol. She reports that she does not use illicit drugs.    REVIEW OF SYSTEMS:   Syanna denies fever/chills, chest pain, shortness of breath, new bowel or bladder complaints or suicidal ideations. All other review of systems was negative.    VITAL SIGNS:   BP 154/82  Pulse 68  Temp 98.1 F (36.7 C)  Resp 16    PHYSICAL EXAMINATION:    General Descriptors:  A well appearing individual, sitting comfortably in chair on room entry in no acute distress.    PSYCH./MENTAL  STATUS:  The patient is awake and alert.  Displays appropriate mood and affect.  Denies suicidal ideation or intent.    HEENT: Head is normocephalic, atraumatic.  Eyes: Extraocular muscles are intact.  Cranial nerves 2-12 grossly intact.  Hearing is grossly intact.  Oral mucosal membranes are moist.      GAIT/GROSS MOTOR STATIONS:  The patient ambulates slowly with the assistance of a Straight Cane.      EXTREMITIES:  The left knee has surgical scarring.  There is swelling and tenderness to palpation over the patella.  ROM markedly decreased.  She presents with a brace.    LUNGS:  Normal respiratory efforts.  Clear to auscultation bilaterally.       CARDIOVASCULAR:  Regular rate and rhythm.      PERIPHERAL VASCULAR: No edema noted in the bilateral lower extremities.    ABDOMEN:  Soft, nontender.    INTEGUMENT: Appearance of exposed skin is unremarkable.    NEUROLOGICAL:  Sensation is intact to light touch in the upper and lower extremities bilaterally. Hoffmann's is negative bilaterally. Coordination intact.    MUSCULOSKELETAL:  Extremity strength is normal and full in all major muscle groups of the upper and lower extremities.  No focal areas of atrophy or tone abnormalities noted.      PALPATION:  No erythema or edema noted in left knee. Pain noted with palpation over the medial aspect of the left knee. Pain noted with flexion and extension of the left leg. Paraspinal muscle spasms noted in the lumbar region.    RANGE OF MOTION:    Patient was wearing a knee brace on the left leg. She did not remove this for exam today.    DIAGNOSIS:        Medial meniscus tear, Left knee     Lateral meniscus tear, Left knee     Left knee DJD     Chondromalacia of patella, left     Sprain and strain of knee and leg     PLAN:   I had a lengthy discussion with Harriett Sine today regarding possible therapeutic options. I continue to recommend a multidisciplinary approach to her chronic pain.   Margaurite has agreed to proceed as follows:     Physical Therapy: I recommend continued physical therapy to focus on core strengthening exercises, strength and flexibility exercises and a low velocity home exercise routine.   Counseling:    Psychological support was offered.   Natalia was counseled regarding the importance of regular exercise and a healthy diet in the role of improving pain symptoms.   Medications:    Clodagh's opiate agreement was reviewed. [place]ARRS was reviewed as well.  She shows no evidence of non-compliance.  Orders Placed This Encounter   Medications   . HYDROcodone-acetaminophen (NORCO) 7.5-325 MG per tablet     Sig: Take 1 tablet by mouth every 8 hours as needed for Pain  for 30 days.     Dispense:  90 tablet     Refill:  0     *I did order a urine drug screen today. Deshanda was given a prescription for lab work including liver enzymes, BUN, Creatinine and a random Urine Drug Screen.  She was instructed to go to the outpatient lab immediately after leaving the office.  She was also instructed that if the Urine Drug Screen is not completed today it my be grounds for dismissal from the opioid program.    I encouraged Tomasita to call with questions, concerns or worsening  of symptoms.  She will be seen again in the office in 1 month.    Gabriel Rung Amanda Pote, D.O.    Board Certified in Pain Management  Board Certified in Physical Medicine and Rehabilitation

## 2011-08-05 LAB — URINE DRUG SCREEN
Amphetamine Screen, Urine: NOT DETECTED ng/mL
Barbiturate Screen, Ur: NOT DETECTED ng/mL (ref ?–200)
Benzodiazepine Screen, Urine: NOT DETECTED ng/mL (ref ?–200)
Cannabinoids: NOT DETECTED ng/mL
Cocaine Metabolites, Ur: NOT DETECTED ng/mL (ref ?–300)
Methadone Screen, Urine: NOT DETECTED ng/mL (ref ?–300)
Opiate Scrn, Ur: POSITIVE ng/mL (ref ?–300)
PCP Screen, Urine: NOT DETECTED ng/mL
Propoxyphene, Urine: NOT DETECTED ng/mL (ref ?–300)

## 2011-08-06 LAB — TLC CONFIRMATION

## 2011-08-09 LAB — OPIATE, QUANTITATIVE, URINE

## 2011-08-09 LAB — MISCELLANEOUS SENDOUT

## 2011-09-01 NOTE — Telephone Encounter (Signed)
Patient had uds/labs done on 08/04/2011.  The results are appropriate for her current medications and the labs are within normal limits.

## 2011-09-03 MED ORDER — HYDROCODONE-ACETAMINOPHEN 7.5-325 MG PO TABS
ORAL_TABLET | Freq: Three times a day (TID) | ORAL | Status: DC | PRN
Start: 2011-09-03 — End: 2011-10-06

## 2011-09-03 NOTE — Progress Notes (Signed)
09/03/2011    Jean Ball    Last visit to Pain Management?  08/04/2011.    Any new medical conditions since your last visit?  no    If yes, explain:      Any new medications since your last visit? no  (See updated med summary sheet)    Describe your pain:  (Location, type, intensity, duration)  She is complaining of pain to her left knee.  She is using a straight cane to assist in ambulation.  She also is wearing a knee brace.  Her gait is slow and steady.    Pain is a 7 on the VAS Scale.    Do you feel your functional level is improved while on opioid medication? yes    Comments:  She had a uds/labs last month    When was the last time you took your pain medication?  norco this morning    BP 154/76  Pulse 80  Temp(Src) 98.5 F (36.9 C) (Oral)  Resp 16               Jean Ball    09/03/2011    HISTORY OF PRESENT ILLNESS:  The patient presents today the [name] today, 09/03/2011 for continued evaluation of   Patient Active Problem List   Diagnoses   . Medial meniscus tear, Left knee   . Lateral meniscus tear, Left knee   . Left knee DJD   . Chondromalacia of patella, left   . Sprain and strain of knee and leg   .    Subjective: See above note. The patient states it is aggravated with activity and controlled with the current pain regimen which includes Norco and is prescribed by this office. The patient denies any side effects or complications from the medications. The patient denies any loss of bowel or bladder, new neurological symptoms or interval weakness. The patient states the Norco does allow her to function at home e.g cooking and some light housework. She continues to do the home band exercises daily for the left knee.    The patient's Review of Systems, Family History, Medical History, and Social History were reviewed with the patient and other than the above changes listed there is no difference from the previous exam.       Medications and allergies have been reviewed in  detail with the patient.  There are no changes here either.  Please see the medication reconciliation sheet in the chart for full details of medications and allergies.    PHYSICAL EXAMINATION:    VITAL SIGNS: BP 154/76  Pulse 80  Temp(Src) 98.5 F (36.9 C) (Oral)  Resp 16  Uses cane for ambulatory assistance.  Neck: normal C-spine, no tenderness, FROM without pain, normal neurological exam of arms; normal DTRs, motor, sensory exam  Back: full range of motion without pain, no tenderness, no spasm, no curvature  Lungs: clear to auscultation bilaterally  Heart: regular rate and rhythm, S1, S2 normal, no murmur, click, rub or gallop  Abdomen: soft, non-tender; bowel sounds normal; no masses,  no organomegaly  Extremities: brace to left knee, tender to palpation in distal aspect of knee especially the medial aspect with decreased sensation in the lateral aspect of the joint  Pulses:2+ and symmetric   Neurologic: Patient is alert and oriented times three. negativespeech normal, mental status intact, cranial nerves 2-12 intact  Motor:Motor exam is 5 out of 5 all extremities with the exception of decreased strength in left  knee with decreased flexion and extension of knee.  Provacative Maneuvers:    Patient Active Problem List:     Medial meniscus tear, Left knee     Lateral meniscus tear, Left knee     Left knee DJD     Chondromalacia of patella, left     Sprain and strain of knee and leg       PLAN:       1.  Medications: The patient's opioid agreement was reviewed.The patient shows no evidence of non-compliance. We did provide the patient with a refill of   Orders Placed This Encounter   Medications   . HYDROcodone-acetaminophen (NORCO) 7.5-325 MG per tablet     Sig: Take 1 tablet by mouth every 8 hours as needed for Pain for 30 days.     Dispense:  90 tablet     Refill:  0     2.   Imaging: none needed today  3.  Physical Therapy: home exercises encouraged   4.  Interventions: no procedures needed at this time  5.   Referrals:none needed at this time  6.  Ongoing Care: We will see the patient in 1 month or sooner if they have any problems before that time. I encouraged the patient to call our office with any questions.    Monika Salk, RNCNP

## 2011-10-06 MED ORDER — HYDROCODONE-ACETAMINOPHEN 7.5-325 MG PO TABS
ORAL_TABLET | Freq: Three times a day (TID) | ORAL | Status: DC | PRN
Start: 2011-10-06 — End: 2011-10-27

## 2011-10-06 NOTE — Progress Notes (Signed)
10/06/2011    Jean Ball    Last visit to Pain Management?  09-03-11    Any new medical conditions since your last visit?  denies    If yes, explain:  no    Any new medications since your last visit? [employer]   (See updated med summary sheet)    Describe your pain:  (Location, type, intensity, duration) left knee. She is using a straight cane to assist in ambulation. She also is wearing a knee brace. Her gait is slow and steady.      Pain is a 7 on the VAS Scale.    Do you feel your functional level is improved while on opioid medication? Yes     Comments:  States she takes Tylenol or Aleve with the Norco.    When was the last time you took your pain medication?  Norco, 1AM    BP 180/70   Pulse 70   Temp(Src) 98.4 ??F (36.9 ??C) (Oral)   Resp 18    C. Mechele Collin, RN           Jean Ball    10/06/2011    HISTORY OF PRESENT ILLNESS:  The patient presents today the [name] today, 10/06/2011 for continued evaluation of   Patient Active Problem List   Diagnoses   ??? Medial meniscus tear, Left knee   ??? Lateral meniscus tear, Left knee   ??? Left knee DJD   ??? Chondromalacia of patella, left   ??? Sprain and strain of knee and leg   .    Subjective: See above note. The patient states it is aggravated with activity and controlled with the current pain regimen which includes Norco and is prescribed by this office. The patient denies any side effects or complications from the medications. The patient denies any loss of bowel or bladder, new neurological symptoms or interval weakness. The patient states the meds continue to control the pain so that she can function at home.    The patient???s Review of Systems, Family History, Medical History, and Social History were reviewed with the patient and other than the above changes listed there is no difference from the previous exam.       Medications and allergies have been reviewed in detail with the patient.  There are no changes here either.  Please see the  medication reconciliation sheet in the chart for full details of medications and allergies.    PHYSICAL EXAMINATION:    VITAL SIGNS: BP 180/70   Pulse 70   Temp(Src) 98.4 ??F (36.9 ??C) (Oral)   Resp 18  Wearing brace to left knee and uses cane for ambulatory aid  Neck: normal C-spine, no tenderness, FROM without pain, normal neurological exam of arms; normal DTRs, motor, sensory exam  Back: full range of motion without pain, no tenderness, no spasm, no curvature  Lungs: clear to auscultation bilaterally  Heart: regular rate and rhythm, S1, S2 normal, no murmur, click, rub or gallop  Abdomen: soft, non-tender; bowel sounds normal; no masses,  no organomegaly  Extremities: edema left foot and lower extremity including knee  Pulses:2+ and symmetric   Neurologic: Patient is alert and oriented times three. negativespeech normal, mental status intact, cranial nerves 2-12 intact, muscle tone normal  Motor:Motor exam is 5 out of 5 all extremities with the exception of left lower extremity is 4/5.  Provacative Maneuvers:    Patient Active Problem List:     Medial meniscus tear, Left  knee     Lateral meniscus tear, Left knee     Left knee DJD     Chondromalacia of patella, left     Sprain and strain of knee and leg       PLAN:       1.  Medications: The patient's opioid agreement was reviewed.The patient shows no evidence of non-compliance. We did provide the patient with a refill of   Orders Placed This Encounter   Medications   ??? HYDROcodone-acetaminophen (NORCO) 7.5-325 MG per tablet     Sig: Take 1 tablet by mouth every 8 hours as needed for Pain for 30 days.     Dispense:  90 tablet     Refill:  0     2.   Imaging: none needed today  3.  Physical Therapy: home exercises encouraged   4.  Interventions: no procedures needed at this time  5.  Referrals:none needed at this time  6.  Ongoing Care: We will see the patient in 1 month or sooner if they have any problems before that time. I encouraged the patient to call our  office with any questions.    Monika Salk, RNCNP

## 2011-11-03 LAB — CBC
Hematocrit: 47.7 % (ref 34.0–48.0)
Hemoglobin: 16.1 g/dL — ABNORMAL HIGH (ref 11.5–15.5)
MCH: 32.1 pg (ref 26.0–35.0)
MCHC: 33.7 % (ref 32.0–34.5)
MCV: 95.2 fL (ref 80.0–99.9)
MPV: 12.6 fL — ABNORMAL HIGH (ref 7.0–12.0)
Platelets: 137 E9/L (ref 130–450)
RBC: 5.01 E12/L (ref 3.50–5.50)
RDW: 13.3 fL (ref 11.5–15.0)
WBC: 6.6 E9/L (ref 4.5–11.5)

## 2011-11-03 LAB — LIPID PANEL
Cholesterol: 203 mg/dL — ABNORMAL HIGH (ref 0–199)
HDL: 40 mg/dL — AB (ref >40.0)
LDL Calculated: 143 mg/dL — ABNORMAL HIGH (ref 0–99)
Triglycerides: 100 mg/dL (ref 0–149)

## 2011-11-03 LAB — COMPREHENSIVE METABOLIC PANEL
ALT: 18 U/L (ref 10–49)
AST: 20 U/L (ref 0–33)
Albumin: 4.3 g/dL (ref 3.2–4.8)
Alkaline Phosphatase: 95 U/L (ref 45–129)
BUN: 18 mg/dL (ref 9–23)
CO2: 29 mmol/L (ref 20–31)
Calcium: 9.1 mg/dL (ref 8.6–10.5)
Chloride: 111 mmol/L — ABNORMAL HIGH (ref 99–109)
Creatinine: 0.6 mg/dL (ref 0.5–1.1)
Glucose: 82 mg/dL (ref 70–110)
Potassium: 3.6 mmol/L (ref 3.5–5.5)
Sodium: 144 mmol/L (ref 132–146)
Total Bilirubin: 0.8 mg/dL (ref 0.3–1.2)
Total Protein: 6.6 g/dL (ref 5.7–8.2)

## 2011-11-03 LAB — TSH: TSH: 0.371 u[IU]/mL (ref 0.350–5.000)

## 2011-11-03 LAB — VITAMIN D 25 HYDROX, D2 & D3: Vitamin D2 And D3, Total: 22 ng/mL — ABNORMAL LOW (ref 30–100)

## 2011-11-03 LAB — GFR CALCULATED: Gfr Calculated: >60 mL/min/{1.73_m2} (ref >=60)

## 2011-11-03 MED ORDER — IBUPROFEN 600 MG PO TABS
600 MG | ORAL_TABLET | Freq: Three times a day (TID) | ORAL | Status: DC | PRN
Start: 2011-11-03 — End: 2011-11-26

## 2011-11-03 MED ORDER — HYDROCODONE-ACETAMINOPHEN 7.5-325 MG PO TABS
ORAL_TABLET | Freq: Three times a day (TID) | ORAL | Status: DC | PRN
Start: 2011-11-03 — End: 2011-11-26

## 2011-11-03 NOTE — Progress Notes (Signed)
11/03/2011    Jean Ball    Last visit to Pain Management?  10-06-11    Any new medical conditions since your last visit?  no    If yes, explain:  no    Any new medications since your last visit? no  (See updated med summary sheet)    Describe your pain:  (Location, type, intensity, duration)  left knee. She is using a straight cane to assist in ambulation. She also is wearing a knee brace. Her gait is slow and steady.         Pain is a 8 on the VAS Scale.    Do you feel your functional level is improved while on opioid medication? Not as well as the Vicodin.    Comments:  Usually takes ASA or Ibuprofen with the Norco to help it work better.    When was the last time you took your pain medication?  Norco:  4AM    BP 168/70   Pulse 88   Temp(Src) 98.7 ??F (37.1 ??C) (Oral)   Resp 18    C. Mechele Collin, RN           HISTORY OF PRESENT ILLNESS:  The patient presents at the [facility] today, 11/03/2011, for continued evaluation of:  Patient Active Problem List   Diagnoses   ??? Medial meniscus tear, Left knee   ??? Lateral meniscus tear, Left knee   ??? Left knee DJD   ??? Chondromalacia of patella, left   ??? Sprain and strain of knee and leg       SUBJECTIVE: The patient complains of the above.  The patient denies any side effects or complications from the medications. The patient denies any loss of bowel or bladder, new neurological symptoms or interval weakness.  She continues to wear her brace on her left knee at all times unless she is sleeping in bed. She does note swelling of the knee and LE down to the foot at times, depending on activity level. At times the skin over the knee is warmer than normal.    REVIEW OF SYSTEMS: Family History, Medical History, and Social History were reviewed with the patient and other than the above changes listed there is no difference from the previous exam.     Medications and allergies have been reviewed in detail with the patient.  There are no changes here either.   Please see the medication reconciliation sheet in the chart for full details of medications and allergies.    Current outpatient prescriptions:HYDROcodone-acetaminophen (NORCO) 7.5-325 MG per tablet, Take 1 tablet by mouth every 8 hours as needed for Pain for 30 days., Disp: 90 tablet, Rfl: 0;  ibuprofen (IBU) 600 MG tablet, Take 1 tablet by mouth every 8 hours as needed for Pain for 30 days., Disp: 120 tablet, Rfl: 0;  OMEGA-3 KRILL OIL 300 MG CAPS, Take 1 tablet by mouth daily.  , Disp: , Rfl:   Diclofenac Sodium (PENNSAID) 1.5 % SOLN, Place 40 drops onto the skin 2 times daily as needed for 30 days., Disp: 3 Bottle, Rfl: 3;  aspirin 325 MG tablet, Take 325 mg by mouth daily.  , Disp: , Rfl: ;  Multiple Vitamins-Minerals (ONE-A-DAY WOMENS 50 PLUS) TABS, Take 1 tablet by mouth daily.  , Disp: , Rfl: ;  Flaxseed, Linseed, (FLAX SEED OIL) 1000 MG CAPS, Take 1 tablet by mouth daily.  , Disp: , Rfl:     PHYSICAL EXAMINATION:    VITAL  SIGNS: BP 168/70   Pulse 88   Temp(Src) 98.7 ??F (37.1 ??C) (Oral)   Resp 18    Head: normocephalic, atraumatic    Neck: no adenopathy, no carotid bruit, no JVD, supple, symmetrical, trachea midline and thyroid not enlarged, symmetric, no tenderness/mass/nodules    Back: full range of motion without pain, no tenderness, no spasm, no curvature kyphosis absent    Lungs: clear to auscultation bilaterally    Heart: regular rate and rhythm, S1, S2 normal, no murmur, click, rub or gallop    Abdomen: soft, non-tender; bowel sounds normal; no masses,  no organomegaly    Extremities: edema left knee and presents with brace on  pain, tenderness to touch, swelling, limited range of motion    Pulses: Peripheral vascular pulses were palpable bilaterally.     Neurologic: Patient is alert and oriented times three. Cranial nerves 2-12 are grossly intact.    Psychiatric:  cooperative     Motor: Muscle strength is 5/5 bilaterally in the upper and lower extremities. Sensation is intact to light touch  throughout in the upper and lower extremities.     Palpation:    Cervical paraspinal musculature bilateral is not tender to palpation and without radiation..     Thoracic paraspinal musculature bilateral is not tender to palpation.     Lumbar paraspinal musculature bilateral is  tender to palpation and does not radiate.    PROVOCATIVE MANEUVERS:   Facet Load:   Pain noted with facet loading of the lumbar spine to neither side    Sacroiliac joints bilateral are negative for tenderness to palpation. bilateral upper trochanterics are negative for tenderness upon palpation.     Patient Active Problem List   Diagnoses   ??? Medial meniscus tear, Left knee   ??? Lateral meniscus tear, Left knee   ??? Left knee DJD   ??? Chondromalacia of patella, left   ??? Sprain and strain of knee and leg        PLAN:                   1. Medications: The patient's opioid agreement was reviewed. The patient shows               no evidence of non-compliance. We did provide the patient with refills of                              Orders Placed This Encounter   Medications   ??? HYDROcodone-acetaminophen (NORCO) 7.5-325 MG per tablet     Sig: Take 1 tablet by mouth every 8 hours as needed for Pain for 30 days.     Dispense:  90 tablet     Refill:  0   ??? ibuprofen (IBU) 600 MG tablet     Sig: Take 1 tablet by mouth every 8 hours as needed for Pain for 30 days.     Dispense:  120 tablet     Refill:  0   .                          2.  Imaging:  N/A unless noted otherwise         3.  Physical Therapy:  N/A unless noted otherwise         4.  Interventions:  N/A unless noted otherwise         5.  Referrals:  N/A unless noted otherwise         6.  Ongoing Care:  We will see the patient in the office in 1 month or sooner if the patient has any problems before that time.  I                           encouraged them to call our office with any questions.                                                                   Electronically signed by Nehemiah Settle

## 2011-12-03 MED ORDER — HYDROCODONE-ACETAMINOPHEN 7.5-325 MG PO TABS
ORAL_TABLET | Freq: Three times a day (TID) | ORAL | Status: DC | PRN
Start: 2011-12-03 — End: 2011-12-28

## 2011-12-03 MED ORDER — IBUPROFEN 600 MG PO TABS
600 MG | ORAL_TABLET | Freq: Three times a day (TID) | ORAL | Status: DC | PRN
Start: 2011-12-03 — End: 2011-12-28

## 2011-12-03 NOTE — Progress Notes (Signed)
12/03/2011    Jean Ball    Last visit to Pain Management?  11/03/11    Any new medical conditions since your last visit?  no    If yes, explain:  no    Any new medications since your last visit? no  (See updated med summary sheet)    Describe your pain:  (Location, type, intensity, duration)  left knee. She is using a straight cane to assist in ambulation. She also is not wearing a knee brace as hinge is being repaired. Her gait is slow and steady.         Pain is a 7 on the VAS Scale.    Do you feel your functional level is improved while on opioid medication? yes    Comments:      When was the last time you took your pain medication?  Early this morning:  Norco and Ibuprofen    BP 140/70   Pulse 80   Temp(Src) 98.3 ??F (36.8 ??C) (Oral)   Resp 18    C. Mechele Collin, RN         12/03/2011    Jean Ball    HISTORY OF PRESENT ILLNESS:  The patient presents today the [name] 12/03/2011 for continued evaluation of:    Patient Active Problem List:     Medial meniscus tear, Left knee     Lateral meniscus tear, Left knee     Left knee DJD     Chondromalacia of patella, left     Sprain and strain of knee and leg      Subjective: she tells me that the cold weather is making the pain worse.  She does see dr. Yetta Barre, but he is not doing any type of injections at this time. She denies any side efffecst from her current medical regimen.  She tells emt hat the ibuprophen helps when she takeds it witht the ibuprophen    Medications: Current outpatient prescriptions:HYDROcodone-acetaminophen (NORCO) 7.5-325 MG per tablet, Take 1 tablet by mouth every 8 hours as needed for Pain for 30 days., Disp: 90 tablet, Rfl: 0;  ibuprofen (IBU) 600 MG tablet, Take 1 tablet by mouth every 8 hours as needed for Pain for 30 days., Disp: 120 tablet, Rfl: 0;  OMEGA-3 KRILL OIL 300 MG CAPS, Take 1 tablet by mouth daily.  , Disp: , Rfl:   Diclofenac Sodium (PENNSAID) 1.5 % SOLN, Place 40 drops onto the skin 2 times daily as needed  for 30 days., Disp: 3 Bottle, Rfl: 3;  aspirin 325 MG tablet, Take 325 mg by mouth daily.  , Disp: , Rfl: ;  Multiple Vitamins-Minerals (ONE-A-DAY WOMENS 50 PLUS) TABS, Take 1 tablet by mouth daily.  , Disp: , Rfl: ;  Flaxseed, Linseed, (FLAX SEED OIL) 1000 MG CAPS, Take 1 tablet by mouth daily.  , Disp: , Rfl:         The patient???s Review of Systems, Family History, Medical History, and Social History were reviewed with the patient and other than the above changes listed there is no difference from the previous exam 11/03/11    Medications and allergies have been reviewed in detail with the patient.  There are no changes here either.  Please see the medication reconciliation sheet in the chart for full details of medications and allergies.    PHYSICAL EXAMINATION:    VITAL SIGNS: BP 140/70   Pulse 80   Temp(Src) 98.3 ??F (36.8 ??C) (Oral)   Resp 18  Head: normocephalic, atraumatic   Neck: no adenopathy, no carotid bruit, no JVD, supple, symmetrical, trachea midline and thyroid not enlarged, symmetric, no tenderness/mass/nodules   Back: full range of motion without pain, no tenderness, no spasm, no curvature kyphosis absent   Lungs: clear to auscultation bilaterally   Heart: regular rate and rhythm, S1, S2 normal, no murmur, click, rub or gallop   Abdomen: soft, non-tender; bowel sounds normal; no masses, no organomegaly   Extremities: edema left knee and presents with brace on pain, tenderness to touch, swelling, limited range of motion   Pulses: Peripheral vascular pulses were palpable bilaterally.   Neurologic: Patient is alert and oriented times three. Cranial nerves 2-12 are grossly intact.   Psychiatric: cooperative   Motor: Muscle strength is 5/5 bilaterally in the upper and lower extremities. Sensation is intact to light touch throughout in the upper and lower extremities.   Palpation:   Cervical paraspinal musculature bilateral is not tender to palpation and without radiation..   Thoracic paraspinal  musculature bilateral is not tender to palpation.   Lumbar paraspinal musculature bilateral is tender to palpation and does not radiate.   PROVOCATIVE MANEUVERS:   Facet Load: Pain noted with facet loading of the lumbar spine to neither side   Sacroiliac joints bilateral are negative for tenderness to palpation. bilateral upper trochanterics are negative for tenderness upon palpation.       Patient Active Problem List:     Medial meniscus tear, Left knee     Lateral meniscus tear, Left knee     Left knee DJD     Chondromalacia of patella, left     Sprain and strain of knee and leg       PLAN:      Medications:    Orders Placed This Encounter   Medications   ??? HYDROcodone-acetaminophen (NORCO) 7.5-325 MG per tablet     Sig: Take 1 tablet by mouth every 8 hours as needed for Pain for 30 days.     Dispense:  90 tablet     Refill:  0   ??? ibuprofen (IBU) 600 MG tablet     Sig: Take 1 tablet by mouth every 8 hours as needed for Pain for 30 days.     Dispense:  120 tablet     Refill:  0     1.   2. Ongoing Care:  The patient???s chronic opioid agreement was reviewed and the patient shows no evidence of non-compliance.  We will see the patient in 1 month or sooner if the patient has any problems before that time.    Sergio Hobart D. Claiborne Stroble, D.O.

## 2012-01-04 MED ORDER — HYDROCODONE-ACETAMINOPHEN 7.5-325 MG PO TABS
ORAL_TABLET | Freq: Three times a day (TID) | ORAL | Status: DC | PRN
Start: 2012-01-04 — End: 2012-01-26

## 2012-01-04 MED ORDER — IBUPROFEN 600 MG PO TABS
600 MG | ORAL_TABLET | Freq: Three times a day (TID) | ORAL | Status: DC | PRN
Start: 2012-01-04 — End: 2012-01-04

## 2012-01-04 MED ORDER — IBUPROFEN 600 MG PO TABS
600 MG | ORAL_TABLET | Freq: Three times a day (TID) | ORAL | Status: DC | PRN
Start: 2012-01-04 — End: 2012-01-26

## 2012-01-04 NOTE — Progress Notes (Signed)
01/04/2012    Jean Ball    Last visit to Pain Management?  12/03/2011    Any new medical conditions since your last visit?  yes    If yes, explain:  Saw PCP regarding blood pressure and not feeling well.    Any new medications since your last visit? yes  (See updated med summary sheet)started on low dose blood pressure pill.  Will bring name and dose next visit.    Describe your pain:  (Location, type, intensity, duration)  She is complaining of pain to her left knee.  She ambulates with a brace on her left knee and the assist of a straight cane.  Her gait is slow and steady.    Pain is a 8 on the VAS Scale.    Do you feel your functional level is improved while on opioid medication? yes    Comments:  QLS 6=fair; falls score 6= level 2; zung 53 moderate depression-offer psych support    When was the last time you took your pain medication?  norco 0200 this morning    BP 134/62   Pulse 88   Temp(Src) 98.4 ??F (36.9 ??C) (Oral)   Resp 18            Truth Wolaver    01/04/2012    HISTORY OF PRESENT ILLNESS:  The patient presents today the [facility] today, 01/04/2012 for continued evaluation of   Patient Active Problem List   Diagnosis   ??? Medial meniscus tear, Left knee   ??? Lateral meniscus tear, Left knee   ??? Left knee DJD   ??? Chondromalacia of patella, left   ??? Sprain and strain of knee and leg   .    Subjective: See above note. The patient states it is aggravated with activity and controlled with the current pain regimen which includes Norco and ibuprofen and is prescribed by this office. The patient denies any side effects or complications from the medications. The patient denies any loss of bowel or bladder, new neurological symptoms or interval weakness. The patient states the meds allow her to function at home. Her Zung scale showed moderate depression and she admits that she sometimes "sits and cries for no reason" so I talked to her about seeing Dr. [name] for evaluation.    The patient???s  Review of Systems, Family History, Medical History, and Social History were reviewed with the patient and other than the above changes listed there is no difference from the previous exam.       Medications and allergies have been reviewed in detail with the patient.  There are no changes here either.  Please see the medication reconciliation sheet in the chart for full details of medications and allergies.    PHYSICAL EXAMINATION:    VITAL SIGNS: BP 134/62   Pulse 88   Temp(Src) 98.4 ??F (36.9 ??C) (Oral)   Resp 18  Uses cane  Neck: normal C-spine, no tenderness, FROM without pain, normal neurological exam of arms; normal DTRs, motor, sensory exam  Back: full range of motion without pain, no tenderness, no spasm, no curvature  Lungs: clear to auscultation bilaterally  Heart: regular rate and rhythm, S1, S2 normal, no murmur, click, rub or gallop  Abdomen: soft, non-tender; bowel sounds normal; no masses,  no organomegaly  Extremities: edema mild in left foot and ankle , wears brace on left knee  Pulses:2+ and symmetric   Neurologic: Patient is alert and oriented times three. positive  findings: abnormal muscle tone left leg, muscular weakness left legspeech normal, mental status intact, cranial nerves 2-12 intact  Motor:Motor exam is 5 out of 5 all extremities with the exception of left leg is weaker  Provacative Maneuvers:    Patient Active Problem List:     Medial meniscus tear, Left knee     Lateral meniscus tear, Left knee     Left knee DJD     Chondromalacia of patella, left     Sprain and strain of knee and leg       PLAN:       1.  Medications: The patient's opioid agreement was reviewed.The patient shows no evidence of non-compliance. We did provide the patient with a refill of   Orders Placed This Encounter   Medications   ??? DISCONTD: ibuprofen (IBU) 600 MG tablet     Sig: Take 1 tablet by mouth every 8 hours as needed for Pain for 30 days.     Dispense:  120 tablet     Refill:  0   ???  HYDROcodone-acetaminophen (NORCO) 7.5-325 MG per tablet     Sig: Take 1 tablet by mouth every 8 hours as needed for Pain for 30 days.     Dispense:  90 tablet     Refill:  0   ??? ibuprofen (IBU) 600 MG tablet     Sig: Take 1 tablet by mouth every 8 hours as needed for Pain for 30 days. Take with food     Dispense:  90 tablet     Refill:  0     2.   Imaging: none needed today  3.  Physical Therapy: home exercises encouraged   4.  Interventions: no procedures needed at this time  5.  Referrals:she will consider seeing Dr. [name] and will let Jean Ball know next month  6.  Ongoing Care: We will see the patient in 1 month or sooner if they have any problems before that time. I encouraged the patient to call our office with any questions.    Monika Salk, RNCNP

## 2012-01-22 LAB — VITAMIN D 25 HYDROX, D2 & D3: Vitamin D2 And D3, Total: 54 ng/mL (ref 30–100)

## 2012-02-02 MED ORDER — HYDROCODONE-ACETAMINOPHEN 7.5-325 MG PO TABS
ORAL_TABLET | Freq: Three times a day (TID) | ORAL | Status: DC | PRN
Start: 2012-02-02 — End: 2012-03-02

## 2012-02-02 MED ORDER — IBUPROFEN 600 MG PO TABS
600 MG | ORAL_TABLET | Freq: Three times a day (TID) | ORAL | Status: DC | PRN
Start: 2012-02-02 — End: 2012-03-02

## 2012-02-02 NOTE — Progress Notes (Signed)
02/02/2012    Jean Ball    Last visit to Pain Management?  01-04-12    Any new medical conditions since your last visit?  no    If yes, explain:  no    Any new medications since your last visit? no  (See updated med summary sheet)    Describe your pain:  (Location, type, intensity, duration)  left knee. She ambulates with a brace on her left knee and the assist of a straight cane. Her gait is slow and steady.            Pain is a 6 on the VAS Scale.    Do you feel your functional level is improved while on opioid medication? yes    Comments:      When was the last time you took your pain medication?  0430 Norco  And Ibuprofen    BP 124/62   Pulse 78   Temp(Src) 98.6 ??F (37 ??C) (Oral)   Resp 18     C. Mechele Collin, RN        [facility]    02/02/2012    HISTORY OF PRESENT ILLNESS:  The patient presents today the [facility] today, 02/02/2012 for continued evaluation of   Patient Active Problem List   Diagnosis   ??? Medial meniscus tear, Left knee   ??? Lateral meniscus tear, Left knee   ??? Left knee DJD   ??? Chondromalacia of patella, left   ??? Sprain and strain of knee and leg   .    Subjective: See above note. The patient states it is aggravated with activity and controlled with the current pain regimen which includes Norco and ibuprofen and is prescribed by this office. The patient denies any side effects or complications from the medications. The patient denies any loss of bowel or bladder, new neurological symptoms or interval weakness. The patient states the meds allow her to function at home. 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 [name].    The patient???s Review of Systems, Family History, Medical History, and Social History were reviewed with the patient and other than the above changes listed there is no difference from the previous exam.       Medications and allergies have been reviewed in detail with the patient.  There  are no changes here either.  Please see the medication reconciliation sheet in the chart for full details of medications and allergies.    PHYSICAL EXAMINATION:    VITAL SIGNS: BP 124/62   Pulse 78   Temp(Src) 98.6 ??F (37 ??C) (Oral)   Resp 18  Uses cane, wears brace on left leg  Neck: normal C-spine, no tenderness, FROM without pain, normal neurological exam of arms; normal DTRs, motor, sensory exam  Back: full range of motion without pain, no tenderness, no spasm, no curvature  Lungs: clear to auscultation bilaterally  Heart: regular rate and rhythm, S1, S2 normal, no murmur, click, rub or gallop  Abdomen: soft, non-tender; bowel sounds normal; no masses,  no organomegaly  Extremities: edema left lower extremity. tender in medial and lateral aspect left knee  Pulses:2+ and symmetric   Neurologic: Patient is alert and oriented times three. positive findings: abnormal muscle tone left lower extremtiy, muscular weakness left lower extremityspeech normal, mental status intact, cranial nerves 2-12 intact  Motor:Motor exam is 5 out of 5 all extremities with the exception of left  lower extremity  Provacative Maneuvers:    Patient Active Problem List:     Medial meniscus tear, Left knee     Lateral meniscus tear, Left knee     Left knee DJD     Chondromalacia of patella, left     Sprain and strain of knee and leg       PLAN:       1.  Medications: The patient's opioid agreement was reviewed.The patient shows no evidence of non-compliance. We did provide the patient with a refill of   Orders Placed This Encounter   Medications   ??? ibuprofen (IBU) 600 MG tablet     Sig: Take 1 tablet by mouth every 8 hours as needed for Pain for 30 days. Take with food   DO  NOT FILL UNTIL 02-03-12     Dispense:  90 tablet     Refill:  0   ??? HYDROcodone-acetaminophen (NORCO) 7.5-325 MG per tablet     Sig: Take 1 tablet by mouth every 8 hours as needed for Pain for 30 days. DO  NOT FILL UNTIL 02-03-12     Dispense:  90 tablet     Refill:  0      2.   Imaging: none needed today  3.  Physical Therapy: home exercises encouraged   4.  Interventions: no procedures needed at this time  5.  Referrals:to Dr. 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  Ongoing Care: We will see the patient in 1 month or sooner if they have any problems before that time. I encouraged the patient to call our office with any questions.    Monika Salk, RNCNP

## 2012-02-23 ENCOUNTER — Inpatient Hospital Stay: Admit: 2012-02-23 | Discharge: 2012-02-23 | Disposition: A | Discharge status: Home / Self Care

## 2012-02-23 MED ORDER — METHYLPREDNISOLONE 4 MG PO TBPK
4 MG | PACK | ORAL | Status: AC
Start: 2012-02-23 — End: 2012-02-29

## 2012-02-23 MED ORDER — PROMETHAZINE-CODEINE 6.25-10 MG/5ML PO SYRP
Freq: Four times a day (QID) | ORAL | Status: DC | PRN
Start: 2012-02-23 — End: 2013-01-23

## 2012-02-23 MED ORDER — AZITHROMYCIN 250 MG PO TABS
250 MG | PACK | ORAL | Status: AC
Start: 2012-02-23 — End: 2012-03-04

## 2012-02-23 MED ORDER — ALBUTEROL SULFATE HFA 108 (90 BASE) MCG/ACT IN AERS
108 (90 Base) MCG/ACT | Freq: Four times a day (QID) | RESPIRATORY_TRACT | Status: AC | PRN
Start: 2012-02-23 — End: 2012-03-18

## 2012-02-23 MED ADMIN — levalbuterol (XOPENEX) nebulizer solution 1.25 mg: RESPIRATORY_TRACT | @ 16:00:00 | NDC 63402051500

## 2012-02-23 MED FILL — LEVALBUTEROL HCL 1.25 MG/0.5ML IN NEBU: 1.25 MG/0.5ML | RESPIRATORY_TRACT | Qty: 1

## 2012-02-23 NOTE — Discharge Instructions (Signed)
Bronchitis  Bronchitis is the body's way of reacting to injury and/or infection (inflammation) of the bronchi. Bronchi are the air tubes that extend from the windpipe into the lungs. If the inflammation becomes severe, it may cause shortness of breath.  CAUSES   Inflammation may be caused by:   A virus.   Germs (bacteria).   Dust.   Allergens.   Pollutants and many other irritants.  The cells lining the bronchial tree are covered with tiny hairs (cilia). These constantly beat upward, away from the lungs, toward the mouth. This keeps the lungs free of pollutants. When these cells become too irritated and are unable to do their job, mucus begins to develop. This causes the characteristic cough of bronchitis. The cough clears the lungs when the cilia are unable to do their job. Without either of these protective mechanisms, the mucus would settle in the lungs. Then you would develop pneumonia.  Smoking is a common cause of bronchitis and can contribute to pneumonia. Stopping this habit is the single most important thing you can do to help yourself.  TREATMENT    Your caregiver may prescribe an antibiotic if the cough is caused by bacteria. Also, medicines that open up your airways make it easier to breathe. Your caregiver may also recommend or prescribe an expectorant. It will loosen the mucus to be coughed up. Only take over-the-counter or prescription medicines for pain, discomfort, or fever as directed by your caregiver.   Removing whatever causes the problem (smoking, for example) is critical to preventing the problem from getting worse.   Cough suppressants may be prescribed for relief of cough symptoms.   Inhaled medicines may be prescribed to help with symptoms now and to help prevent problems from returning.   For those with recurrent (chronic) bronchitis, there may be a need for steroid medicines.  SEEK IMMEDIATE MEDICAL CARE IF:    During treatment, you develop more pus-like mucus (purulent  sputum).   You have a fever.   Your baby is older than 3 months with a rectal temperature of 102 F (38.9 C) or higher.   Your baby is 68 months old or younger with a rectal temperature of 100.4 F (38 C) or higher.   You become progressively more ill.   You have increased difficulty breathing, wheezing, or shortness of breath.  It is necessary to seek immediate medical care if you are elderly or sick from any other disease.  MAKE SURE YOU:    Understand these instructions.   Will watch your condition.   Will get help right away if you are not doing well or get worse.  Document Released: 11/09/2005 Document Revised: 10/29/2011 Document Reviewed: 09/18/2008  [facility] Patient Information 2012 [place].  Sinusitis  Sinuses are air pockets within the bones of your face. The growth of bacteria within a sinus leads to infection. The infection prevents the sinuses from draining. This infection is called sinusitis.  SYMPTOMS   There will be different areas of pain depending on which sinuses have become infected.   The maxillary sinuses often produce pain beneath the eyes.   Frontal sinusitis may cause pain in the middle of the forehead and above the eyes.  Other problems (symptoms) include:   Toothaches.   Colored, pus-like (purulent) drainage from the nose.   Swelling, warmth, and tenderness over the sinus areas may be signs of infection.  TREATMENT   Sinusitis is most often determined by an exam.X-rays may be taken. If x-rays have  been taken, make sure you obtain your results or find out how you are to obtain them. Your caregiver may give you medications (antibiotics). These are medications that will help kill the bacteria causing the infection. You may also be given a medication (decongestant) that helps to reduce sinus swelling.   HOME CARE INSTRUCTIONS    Only take over-the-counter or prescription medicines for pain, discomfort, or fever as directed by your caregiver.   Drink extra fluids. Fluids  help thin the mucus so your sinuses can drain more easily.   Applying either moist heat or ice packs to the sinus areas may help relieve discomfort.   Use saline nasal sprays to help moisten your sinuses. The sprays can be found at your local drugstore.  SEEK IMMEDIATE MEDICAL CARE IF:   You have a fever.   You have increasing pain, severe headaches, or toothache.   You have nausea, vomiting, or drowsiness.   You develop unusual swelling around the face or trouble seeing.  MAKE SURE YOU:    Understand these instructions.   Will watch your condition.   Will get help right away if you are not doing well or get worse.  Document Released: 11/09/2005 Document Revised: 10/29/2011 Document Reviewed: 06/08/2007  [facility] Patient Information 2012 [place].  Bronchospasm, Adult  Bronchospasm means that there is a spasm or tightening of the airways going into the lungs. Because the airways go into a spasm and get smaller it makes breathing more difficult.  For reasons not completely known, workings (functions) of the airways designed to protect the lungs become over active. This causes the airways to become more sensitive to:   Infection.   Weather.   Exercise.   Irritants.   Things that cause allergic reactions or allergies (allergens).  Frequent coughing or respiratory episodes should be checked for the cause. This condition may be made worse by exercise.  CAUSES   Inflammation is often the cause of this condition. Allergy, viral respiratory infections, or irritants in the air often cause this problem. Allergic reactions produce immediate and delayed responses. Late reactions may produce more serious inflammation. This may lead to increased reactivity of the airways. Sometimes this is inherited.  Some common triggers are:   Allergies.   Infection commonly triggers attacks. Antibiotics are not helpful for viral infections and usually do not help with attacks of bronchospasm.   Exercise (running, etc.)  can trigger an attack. Proper pre-exercise medications help most individuals participate in sports. Swimming is the least likely sport to cause problems.   Irritants (for example, pollution, cigarette smoke, strong odors, aerosol sprays, paint fumes, etc.) may trigger attacks. You cannot smoke and do not allow smoking in your home. This is absolutely necessary. Show this instruction to mates, relatives and significant others that may not agree with you.   Weather changes may cause lung problems but moving around trying to find an ideal climate does not seem to be overly helpful. Winds increase molds and pollens in the air. Rain refreshes the air by washing irritants out. Cold air may cause irritation.   Emotional problems do not cause lung problems but can trigger attacks.  SYMPTOMS   Wheezing is the most common symptom. Frequent coughing (with or without exercise and or crying) and repeated respiratory infections are all early warning signs of bronchospasm. Chest tightness and shortness of breath are other symptoms.  DIAGNOSIS   Early hidden bronchospasm may go for long periods of time without being detected. This is especially  true if wheezing cannot be detected by your caregiver. Lung (pulmonary) function studies may help with diagnosis in these cases.  HOME CARE INSTRUCTIONS    It is necessary to remain calm during an attack. Try to relax and breathe more slowly. During this time medications may be given. If any breathing problems seem to be getting worse and are unresponsive to treatment seek immediate medical care.   If you have severe breathing difficulty or have had a life threatening attack it is probably a good idea for you to learn how to give adrenaline (epi-pen) or use an anaphylaxis kit. Your caregiver can help you with this. These are the same kits carried by people who have severe allergic reactions. This is especially important if you do not have readily accessible medical care.   With any  severe breathing problems where epinephrine (adrenaline) has been given at home call 911 immediately as the delayed reaction may be even more severe.  SEEK MEDICAL CARE IF:    There is wheezing and shortness of breath, even if medications are given to prevent attacks.   An oral temperature above 102 F (38.9 C) develops.   There are muscle aches, chest pain, or thickening of sputum.   The sputum changes from clear or white to yellow, green, gray, or bloody.   There are problems that may be related to the medicine you are given, such as a rash, itching, swelling, or trouble breathing.  SEEK IMMEDIATE MEDICAL CARE IF:    The usual medicines do not stop your wheezing, or there is increased coughing.   You have increased difficulty breathing.  MAKE SURE YOU:    Understand these instructions.   Will watch your condition.   Will get help right away if you are not doing well or get worse.  Document Released: 11/12/2003 Document Revised: 10/29/2011 Document Reviewed: 06/27/2008  [facility] Patient Information 2012 [place].

## 2012-02-23 NOTE — ED Notes (Signed)
Adm to rm 2 for exam    Rondell Reams, RN  02/23/12 [phone]

## 2012-02-23 NOTE — ED Notes (Signed)
Pt completed aerosol treatment and states she feels somewhat better. Pulse Ox 96 pulse 86 resp 22    Rondell Reams, RN  02/23/12 1226

## 2012-02-23 NOTE — ED Provider Notes (Signed)
Patient is a 66 y.o. female presenting with URI. The history is provided by the patient.   URI  The primary symptoms include fever, fatigue, sore throat, swollen glands, cough and wheezing. Primary symptoms do not include headaches, ear pain, abdominal pain, nausea, vomiting, arthralgias or rash. The current episode started more than 1 week ago. This is a new problem. The problem has been gradually worsening.   The sore throat is not accompanied by trouble swallowing.   The swelling is not associated with trouble swallowing.   Symptoms associated with the illness include congestion (productive of green mucous). The illness is not associated with chills, plugged ear sensation, facial pain, sinus pressure or rhinorrhea. The following treatments were addressed: Acetaminophen was not tried. A decongestant was not tried. Aspirin was not tried. NSAIDs were not tried. Risk factors for severe complications from URI include being elderly and chronic respiratory disease.       Review of Systems   Constitutional: Positive for fever and fatigue. Negative for chills.   HENT: Positive for congestion (productive of green mucous), sore throat and postnasal drip. Negative for ear pain, nosebleeds, rhinorrhea, mouth sores, trouble swallowing, voice change and sinus pressure.    Eyes: Negative for pain, discharge and redness.   Respiratory: Positive for cough, shortness of breath and wheezing.    Cardiovascular: Negative.    Gastrointestinal: Negative.  Negative for nausea, vomiting and abdominal pain.   Genitourinary: Negative for dysuria and frequency.   Musculoskeletal: Negative for back pain and arthralgias.   Skin: Negative for rash and wound.   Neurological: Negative for weakness and headaches.   Hematological: Negative for adenopathy.   All other systems reviewed and are negative.        Physical Exam   Nursing note and vitals reviewed.  Constitutional: She is oriented to person, place, and time. She appears well-developed  and well-nourished.   HENT:   Head: Normocephalic and atraumatic.   Right Ear: External ear normal.   Left Ear: External ear normal.   Mouth/Throat: No oropharyngeal exudate.   Eyes: Conjunctivae and EOM are normal. Pupils are equal, round, and reactive to light. Right eye exhibits no discharge. Left eye exhibits no discharge.   Neck: Normal range of motion. Neck supple.   Cardiovascular: Normal rate, regular rhythm and normal heart sounds.  Exam reveals no gallop and no friction rub.    No murmur heard.  Pulmonary/Chest: Effort normal. No stridor. No respiratory distress. She has wheezes. She has no rales. She exhibits no tenderness.   Abdominal: Soft. Bowel sounds are normal. There is no tenderness. There is no rebound and no guarding.   Musculoskeletal: She exhibits no edema.   Lymphadenopathy:     She has cervical adenopathy.   Neurological: She is alert and oriented to person, place, and time. No cranial nerve deficit. Coordination normal.   Skin: Skin is warm and dry.       Procedures    MDM    Labs      Radiology      EKG Interpretation.    Time: 12.26  Re-evaluation.  Patient's symptoms are improving  Repeat physical examination is significantly improved. CTA with improved air movement  --------------------------------------------- PAST HISTORY ---------------------------------------------  Past Medical History:  has a past medical history of Hypertension.    Past Surgical History:  has past surgical history that includes knee surgery; Total knee arthroplasty (2007); Total knee arthroplasty (2008); back surgery (1990); and Tubal ligation.    Social History:  reports that she has been smoking Cigarettes.  She has a 30 pack-year smoking history. She does not have any smokeless tobacco history on file. She reports that  drinks alcohol. She reports that she does not use illicit drugs.    Family History: family history includes Cancer in an unspecified family member.     The patient's home medications have been  reviewed.    Allergies: Keflex    -------------------------------------------------- RESULTS -------------------------------------------------  No results found for this visit on 02/23/12.  XR CHEST STANDARD TWO VW    Final Result:            ------------------------- NURSING NOTES AND VITALS REVIEWED ---------------------------   The nursing notes within the ED encounter and vital signs as below have been reviewed.       ------------------------------------------ PROGRESS NOTES ------------------------------------------   I have spoken with the patient and discussed today's results, in addition to providing specific details for the plan of care and counseling regarding the diagnosis and prognosis.  Their questions are answered at this time and they are agreeable with the plan.      --------------------------------- ADDITIONAL PROVIDER NOTES ---------------------------------       The primary encounter diagnosis was Acute sinusitis. A diagnosis of Bronchitis with bronchospasm was also pertinent to this visit.       This patient is stable for discharge.  I have shared the specific conditions for return, as well as the importance of follow-up.        Joaquin Courts, DO  02/23/12 1231

## 2012-03-07 LAB — URINE DRUG SCREEN
Amphetamine Screen, Urine: NOT DETECTED ng/mL
Barbiturate Screen, Ur: NOT DETECTED ng/mL (ref ?–200)
Benzodiazepine Screen, Urine: NOT DETECTED ng/mL (ref ?–200)
Cannabinoids: NOT DETECTED ng/mL
Cocaine Metabolites, Ur: NOT DETECTED ng/mL (ref ?–300)
Methadone Screen, Urine: NOT DETECTED ng/mL (ref ?–300)
Opiate Scrn, Ur: POSITIVE ng/mL (ref ?–300)
PCP Screen, Urine: NOT DETECTED ng/mL
Propoxyphene, Urine: NOT DETECTED ng/mL (ref ?–300)

## 2012-03-07 MED ORDER — IBUPROFEN 600 MG PO TABS
600 MG | ORAL_TABLET | Freq: Three times a day (TID) | ORAL | Status: DC | PRN
Start: 2012-03-07 — End: 2012-03-23

## 2012-03-07 MED ORDER — HYDROCODONE-ACETAMINOPHEN 7.5-325 MG PO TABS
ORAL_TABLET | Freq: Three times a day (TID) | ORAL | Status: DC | PRN
Start: 2012-03-07 — End: 2012-03-23

## 2012-03-07 NOTE — Progress Notes (Signed)
Last seen:02/02/12    Had acute bronchitis and sinusitis which she had to go to the ER.  Had breathing treatment, antibiotics, inhaler, steroids and cough syrup with codiene.  Feeling better and not on any of these meds now.     Pain location: left knee with cramping    She ambulates with a brace on her left knee and the assist of a straight cane. Her gait is slow and steady.     Patient is requesting refill of her pain medications which is currently Norco and Ibuprofen  Last dose of pain medication: 5am today    On the VAS pain scale patient states pain is a 8     BP 106/60  Pulse 80  Temp(Src) 98.1 F (36.7 C)  Resp 20  Mahalie Kanner    03/07/2012    HISTORY OF PRESENT ILLNESS:  The patient presents today the [name] today, 03/07/2012 for continued evaluation of   Patient Active Problem List   Diagnosis   . Medial meniscus tear, Left knee   . Lateral meniscus tear, Left knee   . Left knee DJD   . Chondromalacia of patella, left   . Sprain and strain of knee and leg   .    Subjective: See above note. The patient states it is aggravated with activity and controlled with the current pain regimen which includes norco and ibuprofen and is prescribed by this office. The patient denies any side effects or complications from the medications. The patient denies any loss of bowel or bladder, new neurological symptoms or interval weakness. The patient states the meds allow her to function at home.    The patient's Review of Systems, Family History, Medical History, and Social History were reviewed with the patient and other than the above changes listed there is no difference from the previous exam.       Medications and allergies have been reviewed in detail with the patient.  There are no changes here either.  Please see the medication reconciliation sheet in the chart for full details of medications and allergies.    PHYSICAL EXAMINATION:    VITAL SIGNS: BP 106/60  Pulse 80  Temp(Src) 98.1 F  (36.7 C)  Resp 20  Wearing a brace on the left knee  Neck: normal C-spine, no tenderness, FROM without pain, normal neurological exam of arms; normal DTRs, motor, sensory exam  Back: full range of motion without pain, no tenderness, no spasm, no curvature  Lungs: diminished breath sounds posterior - bilateral  Heart: regular rate and rhythm, S1, S2 normal, no murmur, click, rub or gallop  Abdomen: soft, non-tender; bowel sounds normal; no masses,  no organomegaly  Extremities: brace to left knee with tenderness to palpation  Pulses:2+ and symmetric   Neurologic: Patient is alert and oriented times three. Gait normal. Reflexes normal and symmetric. Sensation grossly normalspeech normal, mental status intact, cranial nerves 2-12 intact  Motor:Motor exam is 5 out of 5 all extremities with the exception of left lower extremity  Provacative Maneuvers:    Patient Active Problem List:     Medial meniscus tear, Left knee     Lateral meniscus tear, Left knee     Left knee DJD     Chondromalacia of patella, left     Sprain and strain of knee and leg       PLAN:       1.  Medications: The patient's opioid agreement was reviewed.The patient shows no  evidence of non-compliance. We did provide the patient with a refill of   Orders Placed This Encounter   Medications   . HYDROcodone-acetaminophen (NORCO) 7.5-325 MG per tablet     Sig: Take 1 tablet by mouth every 8 hours as needed for Pain for 30 days.     Dispense:  90 tablet     Refill:  0   . ibuprofen (IBU) 600 MG tablet     Sig: Take 1 tablet by mouth every 8 hours as needed for Pain for 30 days. Take with food     Dispense:  90 tablet     Refill:  0     2.   Imaging: none needed today  3.  Physical Therapy: home exercises encouraged   4.  Interventions: no procedures needed at this time  5.  Referrals:none needed at this time  6.  Ongoing Care: We will see the patient in 1 month or sooner if they have any problems before that time. I encouraged the patient to call our  office with any questions. Jailynn was given a prescription for a random Urine Drug Screen.  She was instructed to go to the outpatient lab immediately after leaving the office.  She was also instructed that if the Urine Drug Screen is not completed today it my be grounds for dismissal from the opioid program.      Monika Salk, RNCNP

## 2012-03-08 LAB — TLC CONFIRMATION

## 2012-03-12 LAB — OPIATE, QUANTITATIVE, URINE

## 2012-03-16 NOTE — Telephone Encounter (Signed)
Patient had uds done on 03/07/2012.  The results are appropriate.

## 2012-03-18 ENCOUNTER — Inpatient Hospital Stay
Admit: 2012-03-18 | Discharge: 2012-03-18 | Disposition: A | Discharge status: Home / Self Care | Attending: Emergency Medicine

## 2012-03-18 MED ORDER — KETOROLAC TROMETHAMINE 60 MG/2ML IM SOLN
60 MG/2ML | Freq: Once | INTRAMUSCULAR | Status: AC
Start: 2012-03-18 — End: 2012-03-18
  Administered 2012-03-18: 18:00:00 via INTRAMUSCULAR

## 2012-03-18 MED ORDER — METHYLPREDNISOLONE 4 MG PO TBPK
4 MG | PACK | ORAL | Status: AC
Start: 2012-03-18 — End: 2012-03-24

## 2012-03-18 MED ORDER — CYCLOBENZAPRINE HCL 10 MG PO TABS
10 MG | ORAL_TABLET | Freq: Three times a day (TID) | ORAL | Status: AC | PRN
Start: 2012-03-18 — End: 2012-03-28

## 2012-03-18 MED ORDER — ORPHENADRINE CITRATE 30 MG/ML IJ SOLN
30 MG/ML | Freq: Once | INTRAMUSCULAR | Status: AC
Start: 2012-03-18 — End: 2012-03-18
  Administered 2012-03-18: 18:00:00 via INTRAMUSCULAR

## 2012-03-18 MED FILL — KETOROLAC TROMETHAMINE 60 MG/2ML IM SOLN: 60 MG/2ML | INTRAMUSCULAR | Qty: 2

## 2012-03-18 MED FILL — ORPHENADRINE CITRATE 30 MG/ML IJ SOLN: 30 mg/mL | INTRAMUSCULAR | Qty: 2

## 2012-03-18 NOTE — ED Provider Notes (Signed)
HPIthis is a 66 year old Caucasian female patient who presents to the ER with complaints of right-sided buttock pain which radiates around into her anterior thigh.  The pain started yesterday while she was crocheting.  No reported fall or injury.  She denies any saddle dysesthesias or change in bowel or bladder function.  She does see pain management for her chronic left knee pain.  Her last dose of Norco was taking yesterday.  She stated that it did not improve her pain.  She denies any headache or change in vision or focal neurological weakness or paralysis. She denies any chest pain shortness of breath, abdominal pain, nausea, vomiting or diarrhea.  She denies any fevers or chills. She denies any dysuria urinary frequency or hematuria.  She rates her pain a 10 out of 10 as a sharp throbbing pain, intermittent and worsens with movement.      Review of Systems   Constitutional: Negative for fever and chills.   HENT: Negative for ear pain, sore throat and sinus pressure.    Eyes: Negative for pain, discharge and redness.   Respiratory: Negative for cough, shortness of breath and wheezing.    Cardiovascular: Negative for chest pain.   Gastrointestinal: Negative for nausea, vomiting, diarrhea and abdominal distention.   Genitourinary: Negative for dysuria and frequency.   Musculoskeletal: Negative for back pain and arthralgias.   Skin: Negative for rash and wound.   Neurological: Negative for weakness and headaches.   Hematological: Negative for adenopathy.   All other systems reviewed and are negative.        Physical Exam   Nursing note and vitals reviewed.  Constitutional: She is oriented to person, place, and time. She appears well-developed and well-nourished.   HENT:   Head: Normocephalic and atraumatic.   Eyes: Pupils are equal, round, and reactive to light.   Neck: Normal range of motion. Neck supple.   Cardiovascular: Normal rate, regular rhythm and normal heart sounds.    No murmur heard.  Pulmonary/Chest:  Effort normal and breath sounds normal.   Abdominal: Soft. Bowel sounds are normal. There is no tenderness. There is no rebound and no guarding.   Musculoskeletal: She exhibits no edema.   Patient had right sided sciatica notch tenderness to palpation. Negative midline thoracic, lumbar or sacral tenderness step-off or drop-offs upon palpation. Pelvis was stable. Patient had positive right straight leg test.  Normal rectal tone.   Neurological: She is alert and oriented to person, place, and time.   Skin: Skin is warm and dry.       Procedures    MDM  --------------------------------------------- PAST HISTORY ---------------------------------------------  Past Medical History:  has a past medical history of Hypertension.    Past Surgical History:  has past surgical history that includes knee surgery; Total knee arthroplasty (2007); Total knee arthroplasty (2008); back surgery (1990); and Tubal ligation.    Social History:  reports that she has been smoking Cigarettes.  She has a 30 pack-year smoking history. She does not have any smokeless tobacco history on file. She reports that  drinks alcohol. She reports that she does not use illicit drugs.    Family History: family history includes Cancer in an unspecified family member.     The patient's home medications have been reviewed.    Allergies: Keflex    -------------------------------------------------- RESULTS -------------------------------------------------  No results found for this visit on 03/18/12.       ------------------------- NURSING NOTES AND VITALS REVIEWED ---------------------------   The nursing notes  within the ED encounter and vital signs as below have been reviewed.   BP 123/60  Pulse 73  Temp(Src) 97.5 F (36.4 C) (Oral)  Resp 20  Ht 5\' 7"  (1.702 m)  Wt 181 lb (82.101 kg)  BMI 28.34 kg/m2  SpO2 97%  Oxygen Saturation Interpretation: Normal      ------------------------------------------ PROGRESS NOTES  ------------------------------------------   I have spoken with the patient and discussed today's results, in addition to providing specific details for the plan of care and counseling regarding the diagnosis and prognosis.  Their questions are answered at this time and they are agreeable with the plan.      --------------------------------- ADDITIONAL PROVIDER NOTES ---------------------------------     The patient's symptoms seem consistent with sciatica.  There is no evidence of cauda equina syndrome.  The patient denied and atraumatic injury therefore no x-ray was obtained as I do not suspect a fracture or dislocation.  The patient will be discharged home with sciatica instructions.  She was placed on a Medrol Dosepak and Flexeril.  She was given a shot of Norflex and Toradol in the ER prior to discharge.  Her symptoms didn't improve. She was to return here if her symptoms were worse and otherwise followup with her primary care physician.      This patient is stable for discharge.  I have shared the specific conditions for return, as well as the importance of follow-up.         --------------------------------- IMPRESSION AND DISPOSITION ---------------------------------    IMPRESSION  1. Sciatica        DISPOSITION  Disposition: Discharge to home  Patient condition is good              Valora Piccolo, MD  03/18/12 1401

## 2012-03-30 MED ORDER — IBUPROFEN 600 MG PO TABS
600 MG | ORAL_TABLET | Freq: Three times a day (TID) | ORAL | Status: DC | PRN
Start: 2012-03-30 — End: 2012-04-05

## 2012-03-30 MED ORDER — HYDROCODONE-ACETAMINOPHEN 7.5-325 MG PO TABS
ORAL_TABLET | Freq: Three times a day (TID) | ORAL | Status: DC | PRN
Start: 2012-03-30 — End: 2012-04-05

## 2012-04-05 MED ORDER — IBUPROFEN 600 MG PO TABS
600 MG | ORAL_TABLET | Freq: Three times a day (TID) | ORAL | Status: DC | PRN
Start: 2012-04-05 — End: 2012-04-25

## 2012-04-05 MED ORDER — HYDROCODONE-ACETAMINOPHEN 7.5-325 MG PO TABS
ORAL_TABLET | Freq: Three times a day (TID) | ORAL | Status: DC | PRN
Start: 2012-04-05 — End: 2012-04-25

## 2012-04-05 NOTE — Progress Notes (Addendum)
04/05/2012    Jean Ball    Last visit to Pain Management?  03/07/2012    Any new medical conditions since your last visit?  yes    If yes, explain:  Went to ER for pain, had 2 injections    Any new medications since your last visit? Was given a Medrol dose pack and a muscle relaxer in ER, all completed  (See updated med summary sheet)    Describe your pain:  (Location, type, intensity, duration)  Left knee    Pain is a 8 on the VAS Scale.    Do you feel your functional level is improved while on opioid medication? "Yes"    Comments:  Denies any paresthesias.  Ambulating with a straight cane, wearing a left knee brace.    When was the last time you took your pain medication?  This AM. Norco and Ibuprofen    BP 152/81   Pulse 70   Temp(Src) 98.9 ??F (37.2 ??C) (Oral)   Resp 16    L.Steinbeck RN        HISTORY OF PRESENT ILLNESS:  The patient presents at the [facility] today, 04/05/2012, for continued evaluation of:  Patient Active Problem List   Diagnosis   ??? Medial meniscus tear, Left knee   ??? Lateral meniscus tear, Left knee   ??? Left knee DJD   ??? Chondromalacia of patella, left   ??? Sprain and strain of knee and leg       SUBJECTIVE: The patient complains of the above.  The patient denies any side effects or complications from the medications. The patient denies any loss of bowel or bladder, new neurological symptoms or interval weakness.  She continues to have pain and swelling in her left knee. She presents today with brace on. She tells me that the TENS unit is effective in reducing her left knee pain but that she needs more pads so we will request these today.    REVIEW OF SYSTEMS: Family History, Medical History, and Social History were reviewed with the patient and other than the above changes listed there is no difference from the previous exam.     Medications and allergies have been reviewed in detail with the patient.  There are no changes here either.  Please see the medication  reconciliation sheet in the chart for full details of medications and allergies.    Current outpatient prescriptions:HYDROcodone-acetaminophen (NORCO) 7.5-325 MG per tablet, Take 1 tablet by mouth every 8 hours as needed for Pain for 30 days. DO NOT FILL UNTIL 04-06-12, Disp: 90 tablet, Rfl: 0;  ibuprofen (IBU) 600 MG tablet, Take 1 tablet by mouth every 8 hours as needed for Pain for 30 days. Take with food  DO NOT FILL UNTIL 04-06-12, Disp: 90 tablet, Rfl: 0  lisinopril (PRINIVIL;ZESTRIL) 10 MG tablet, Take  by mouth daily., Disp: , Rfl: ;  promethazine-codeine (PHENERGAN WITH CODEINE) 6.25-10 MG/5ML syrup, Take 5 mLs by mouth 4 times daily as needed for Cough., Disp: 120 mL, Rfl: 0;  EXPIRED: albuterol (PROVENTIL HFA) 108 (90 BASE) MCG/ACT inhaler, Inhale 2 puffs into the lungs every 6 hours as needed for Wheezing for 7 days., Disp: 1 Inhaler, Rfl: 0  OMEGA-3 KRILL OIL 300 MG CAPS, Take 1 tablet by mouth daily.  , Disp: , Rfl: ;  EXPIRED: Diclofenac Sodium (PENNSAID) 1.5 % SOLN, Place 40 drops onto the skin 2 times daily as needed for 30 days., Disp: 3 Bottle, Rfl: 3;  aspirin 325 MG tablet, Take 325 mg by mouth daily.  , Disp: , Rfl: ;  Multiple Vitamins-Minerals (ONE-A-DAY WOMENS 50 PLUS) TABS, Take 1 tablet by mouth daily.  , Disp: , Rfl:   Flaxseed, Linseed, (FLAX SEED OIL) 1000 MG CAPS, Take 1 tablet by mouth daily.  , Disp: , Rfl:     PHYSICAL EXAMINATION:    VITAL SIGNS: BP 152/81   Pulse 70   Temp(Src) 98.9 ??F (37.2 ??C) (Oral)   Resp 16    Head: normocephalic, atraumatic    Neck: no adenopathy, no carotid bruit, no JVD, supple, symmetrical, trachea midline and thyroid not enlarged, symmetric, no tenderness/mass/nodules    Back: full range of motion without pain, no tenderness, no spasm, no curvature kyphosis absent    Lungs: clear to auscultation bilaterally    Heart: regular rate and rhythm, S1, S2 normal, no murmur, click, rub or gallop    Abdomen: soft, non-tender; bowel sounds normal; no masses,  no  organomegaly    Extremities: Homans sign is negative, no sign of DVT left knee pain, tenderness to touch, swelling, limited range of motion, post surgical scarring, markedly decreased ROM, brace on.    Pulses: Peripheral vascular pulses were palpable bilaterally.     Neurologic: Patient is alert and oriented times three. Cranial nerves 2-12 are grossly intact.    Psychiatric:  cooperative     Motor: Muscle strength is 5/5 bilaterally in the upper and lower extremities. Sensation is intact to light touch throughout in the upper and lower extremities.     Palpation:    Cervical paraspinal musculature bilateral is not tender to palpation and without radiation..     Thoracic paraspinal musculature bilateral is not tender to palpation.     Lumbar paraspinal musculature bilateral is  tender to palpation and does not radiate.    PROVOCATIVE MANEUVERS:   Facet Load:   Pain noted with facet loading of the lumbar spine to neither side    Sacroiliac joints bilateral are negative for tenderness to palpation. Bilateral upper trochanterics are negative for tenderness upon palpation.     Patient Active Problem List   Diagnosis   ??? Medial meniscus tear, Left knee   ??? Lateral meniscus tear, Left knee   ??? Left knee DJD   ??? Chondromalacia of patella, left   ??? Sprain and strain of knee and leg        PLAN:                   1. Medications: The patient's opioid agreement was reviewed. The patient shows               no evidence of non-compliance. We did provide the patient with refills of                              Orders Placed This Encounter   Medications   ??? HYDROcodone-acetaminophen (NORCO) 7.5-325 MG per tablet     Sig: Take 1 tablet by mouth every 8 hours as needed for Pain for 30 days. DO NOT FILL UNTIL 04-06-12     Dispense:  90 tablet     Refill:  0     ICD-9: 836.0, 836.1, 715.96, 884.9, 717.7   ??? ibuprofen (IBU) 600 MG tablet     Sig: Take 1 tablet by mouth every 8 hours as needed for Pain for 30 days. Take with food  DO  NOT  FILL UNTIL 04-06-12     Dispense:  90 tablet     Refill:  0     ICD-9: 836.0, 836.1, 715.96, 884.9, 717.7   .                          2.  Imaging:  N/A unless noted otherwise         3.  Physical Therapy:  C-9 for TENS unit supplies         4.  Interventions:  Handicapped placard x 2 yrs given         5.  Referrals:  N/A unless noted otherwise         6.  Ongoing Care:  We will see the patient in the office in 1 month or sooner if the patient has any problems before that time.  I                           encouraged them to call our office with any questions.                                                                   Electronically signed by Nehemiah Settle

## 2012-05-03 MED ORDER — IBUPROFEN 600 MG PO TABS
600 MG | ORAL_TABLET | Freq: Three times a day (TID) | ORAL | Status: DC | PRN
Start: 2012-05-03 — End: 2012-05-23

## 2012-05-03 MED ORDER — HYDROCODONE-ACETAMINOPHEN 7.5-325 MG PO TABS
ORAL_TABLET | Freq: Three times a day (TID) | ORAL | Status: DC | PRN
Start: 2012-05-03 — End: 2012-05-23

## 2012-05-03 NOTE — Progress Notes (Signed)
05/03/2012    Jean Ball    Last visit to Pain Management?  04/05/2012    Any new medical conditions since your last visit?  No new medical problems    If yes, explain:  n/a    Any new medications since your last visit? No new  medications  (See updated med summary sheet)    Describe your pain:  (Location, type, intensity, duration)  Left knee    Pain is a 9 on the VAS Scale.    Do you feel your functional level is improved while on opioid medication? "Yes"    When was the last time you took your pain medication?  This AM, Norco and Ibuprofen    Ambulates with the assistance of?:  Straight cane    Comments:  Complains of a "numb spot" on right knee.  Wearing a knee brace.    BP 138/62   Pulse 80   Temp(Src) 98 ??F (36.7 ??C) (Oral)   Resp 16    L.Valerie Salts RN        05/03/2012    Jean Ball    HISTORY OF PRESENT ILLNESS:  The patient presents today the [name] 05/03/2012 for continued evaluation of:    Patient Active Problem List:     Medial meniscus tear, Left knee     Lateral meniscus tear, Left knee     Left knee DJD     Chondromalacia of patella, left     Sprain and strain of knee and leg      Subjective patient is doing well with her current medical regimen.   She tells me that she is hurting more because of the rain,  She does try to walk every day.  She is not interested in any proceedures at this time.     Medications: Current outpatient prescriptions:HYDROcodone-acetaminophen (NORCO) 7.5-325 MG per tablet, Take 1 tablet by mouth every 8 hours as needed for Pain for 30 days. DO NOT FILL UNTIL 05/05/2012., Disp: 90 tablet, Rfl: 0;  ibuprofen (IBU) 600 MG tablet, Take 1 tablet by mouth every 8 hours as needed for Pain for 30 days. Take with food.  DO NOT FILL UNTIL 05/05/2012., Disp: 90 tablet, Rfl: 0  lisinopril (PRINIVIL;ZESTRIL) 10 MG tablet, Take  by mouth daily., Disp: , Rfl: ;  promethazine-codeine (PHENERGAN WITH CODEINE) 6.25-10 MG/5ML syrup, Take 5 mLs by mouth 4 times daily as  needed for Cough., Disp: 120 mL, Rfl: 0;  EXPIRED: albuterol (PROVENTIL HFA) 108 (90 BASE) MCG/ACT inhaler, Inhale 2 puffs into the lungs every 6 hours as needed for Wheezing for 7 days., Disp: 1 Inhaler, Rfl: 0  OMEGA-3 KRILL OIL 300 MG CAPS, Take 1 tablet by mouth daily.  , Disp: , Rfl: ;  EXPIRED: Diclofenac Sodium (PENNSAID) 1.5 % SOLN, Place 40 drops onto the skin 2 times daily as needed for 30 days., Disp: 3 Bottle, Rfl: 3;  aspirin 325 MG tablet, Take 325 mg by mouth daily.  , Disp: , Rfl: ;  Multiple Vitamins-Minerals (ONE-A-DAY WOMENS 50 PLUS) TABS, Take 1 tablet by mouth daily.  , Disp: , Rfl:   Flaxseed, Linseed, (FLAX SEED OIL) 1000 MG CAPS, Take 1 tablet by mouth daily.  , Disp: , Rfl:         The patient???s Review of Systems, Family History, Medical History, and Social History were reviewed with the patient and other than the above changes listed there is no difference from the previous exam 04/05/12  Medications and allergies have been reviewed in detail with the patient.  There are no changes here either.  Please see the medication reconciliation sheet in the chart for full details of medications and allergies.    PHYSICAL EXAMINATION:    VITAL SIGNS: BP 138/62   Pulse 80   Temp(Src) 98 ??F (36.7 ??C) (Oral)   Resp 16  Head: normocephalic, atraumatic   Neck: no adenopathy, no carotid bruit, no JVD, supple, symmetrical, trachea midline and thyroid not enlarged, symmetric, no tenderness/mass/nodules   Back: full range of motion without pain, no tenderness, no spasm, no curvature kyphosis absent   Lungs: clear to auscultation bilaterally   Heart: regular rate and rhythm, S1, S2 normal, no murmur, click, rub or gallop   Abdomen: soft, non-tender; bowel sounds normal; no masses, no organomegaly   Extremities: Homans sign is negative, no sign of DVT left knee pain, tenderness to touch, swelling, limited range of motion, post surgical scarring, markedly decreased ROM, brace on.   Pulses: Peripheral vascular  pulses were palpable bilaterally.   Neurologic: Patient is alert and oriented times three. Cranial nerves 2-12 are grossly intact.   Psychiatric: cooperative   Motor: Muscle strength is 5/5 bilaterally in the upper and lower extremities. Sensation is intact to light touch throughout in the upper and lower extremities.   Palpation:   Cervical paraspinal musculature bilateral is not tender to palpation and without radiation..   Thoracic paraspinal musculature bilateral is not tender to palpation.   Lumbar paraspinal musculature bilateral is tender to palpation and does not radiate.   PROVOCATIVE MANEUVERS:   Facet Load: Pain noted with facet loading of the lumbar spine to neither side   Sacroiliac joints bilateral are negative for tenderness to palpation. Bilateral upper trochanterics are negative for tenderness upon palpation.       atient Active Problem List:     Medial meniscus tear, Left knee     Lateral meniscus tear, Left knee     Left knee DJD     Chondromalacia of patella, left     Sprain and strain of knee and leg       PLAN:      Medications:    Orders Placed This Encounter   Medications   ??? HYDROcodone-acetaminophen (NORCO) 7.5-325 MG per tablet     Sig: Take 1 tablet by mouth every 8 hours as needed for Pain for 30 days. DO NOT FILL UNTIL 05/05/2012.     Dispense:  90 tablet     Refill:  0     ICD-9: 836.0, 836.1, 715.96, 884.9, 717.7   ??? ibuprofen (IBU) 600 MG tablet     Sig: Take 1 tablet by mouth every 8 hours as needed for Pain for 30 days. Take with food.  DO NOT FILL UNTIL 05/05/2012.     Dispense:  90 tablet     Refill:  0     ICD-9: 836.0, 836.1, 715.96, 884.9, 717.7       Ongoing Care:  The patient???s chronic opioid agreement was reviewed and the patient shows no evidence of non-compliance.  We will see the patient in 1 month or sooner if the patient has any problems before that time.     Scout Guyett D. Luman Holway, D.O.

## 2012-06-01 MED ORDER — IBUPROFEN 600 MG PO TABS
600 MG | ORAL_TABLET | Freq: Three times a day (TID) | ORAL | Status: DC | PRN
Start: 2012-06-01 — End: 2012-06-27

## 2012-06-01 MED ORDER — HYDROCODONE-ACETAMINOPHEN 7.5-325 MG PO TABS
ORAL_TABLET | Freq: Three times a day (TID) | ORAL | Status: DC | PRN
Start: 2012-06-01 — End: 2012-06-27

## 2012-06-01 NOTE — Progress Notes (Signed)
06/01/2012    Jean Ball    Last visit to Pain Management?  05-03-12    Any new medical conditions since your last visit?  no    If yes, explain:  no    Any new medications since your last visit? no  (See updated med summary sheet)    Describe your pain:  (Location, type, intensity, duration)  Left knee      Pain is a 9 on the VAS Scale.    Do you feel your functional level is improved while on opioid medication? yes    When was the last time you took your pain medication?  2AM: Norco  4:30AM:  Ibuprofen    Ambulates with the assistance of?:  Cane and hinged knee brace    Comments:  States she did not take her BP Medication yesterday.  Will take it as soon as she gets home.     BP 180/80   Pulse 80   Temp(Src) 98.4 ??F (36.9 ??C) (Oral)   Resp 18    C. Mechele Collin, RN        Jean Ball    06/01/2012    HISTORY OF PRESENT ILLNESS:  The patient presents today the [name] today, 06/01/2012 for continued evaluation of   Patient Active Problem List   Diagnosis   ??? Medial meniscus tear, Left knee   ??? Lateral meniscus tear, Left knee   ??? Left knee DJD   ??? Chondromalacia of patella, left   ??? Sprain and strain of knee and leg   .    Subjective: See above note. The patient states it is aggravated with activity and controlled with the current pain regimen which includes Norco and Ibuprofen is prescribed by this office. The patient denies any side effects or complications from the medications. The patient denies any loss of bowel or bladder, new neurological symptoms or interval weakness.patient states that she forgot to take her medication for HTN today and will when she gets home. Patient states that the rain has increased her pain levels in her right knee. Patient states that the medications helps her function.    The patient???s Review of Systems, Family History, Medical History, and Social History were reviewed with the patient and other than the above changes listed there is no difference from the  previous exam.       Medications and allergies have been reviewed in detail with the patient.  There are no changes here either.  Please see the medication reconciliation sheet in the chart for full details of medications and allergies.    PHYSICAL EXAMINATION:    VITAL SIGNS: BP 180/80   Pulse 80   Temp(Src) 98.4 ??F (36.9 ??C) (Oral)   Resp 18    Neck: tenderness over lower cervical spine and nuchal area, normal neurological exam of arms; normal DTRs, motor, sensory exam  Back: inspection and palpation: paraspinal tenderness noted bilateral lower lumbar and muscle tone and ROM exam: limited range of motion with pain, antalgic gait  Lungs: clear to auscultation bilaterally  Heart: regular rate and rhythm, S1, S2 normal, no murmur, click, rub or gallop  Abdomen: soft, non-tender; bowel sounds normal; no masses,  no organomegaly  Extremities: extremities normal, atraumatic, no cyanosis or edema brace on left knee, tender on medial and lateral aspects of left knee no swelling noted  Pulses:2+ and symmetric   Neurologic: Patient is alert and oriented times three.uses a cane for support. Reflexes normal and symmetric.  Sensation grossly normalspeech normal, mental status intact, cranial nerves 2-12 intact, gait, including heel, toe, and tandem walking normal, muscle tone normal  Motor:Motor exam is symmetrical 5 out of 5 all extremities bilaterally      Patient Active Problem List:     Medial meniscus tear, Left knee     Lateral meniscus tear, Left knee     Left knee DJD     Chondromalacia of patella, left     Sprain and strain of knee and leg       PLAN:       1.  Medications: The patient's opioid agreement was reviewed.The patient shows no evidence of non-compliance. We did provide the patient with a refill of   Orders Placed This Encounter   Medications   ??? ibuprofen (IBU) 600 MG tablet     Sig: Take 1 tablet by mouth every 8 hours as needed for Pain for 30 days. Take with food.  DO NOT FILL UNTIL 06-03-12     Dispense:   90 tablet     Refill:  0     ICD-9: 836.0, 836.1, 715.96, 884.9, 717.7   ??? HYDROcodone-acetaminophen (NORCO) 7.5-325 MG per tablet     Sig: Take 1 tablet by mouth every 8 hours as needed for Pain for 30 days. DO NOT FILL UNTIL 06-03-12     Dispense:  90 tablet     Refill:  0     ICD-9: 836.0, 836.1, 715.96, 884.9, 717.7     2.   Imaging: none needed today  3.  Physical Therapy: home exercises encouraged   4.  Interventions: no procedures needed at this time  5.  Referrals:none needed at this time  6.  Ongoing Care: We will see the patient in 1 month or sooner if they have any problems before that time. I encouraged the patient to call our office with any questions.    Tayona Sarnowski McManusACNS-BC

## 2012-06-28 MED ORDER — HYDROCODONE-ACETAMINOPHEN 7.5-325 MG PO TABS
ORAL_TABLET | Freq: Three times a day (TID) | ORAL | Status: DC | PRN
Start: 2012-06-28 — End: 2012-07-12

## 2012-06-28 MED ORDER — IBUPROFEN 600 MG PO TABS
600 MG | ORAL_TABLET | Freq: Three times a day (TID) | ORAL | Status: DC | PRN
Start: 2012-06-28 — End: 2012-07-12

## 2012-06-28 NOTE — Progress Notes (Signed)
06/28/2012    Jean Ball    Last visit to Pain Management?  06/01/2012    Any new medical conditions since your last visit?  Yes    If yes, explain:  Saw Dr George Ina    Any new medications since your last visit? Her blood pressure medication was changed but she does not know the name, will let us know next visit  (See updated med summary sheet)    Describe your pain:  (Location, type, intensity, duration)  Left knee    Pain is a 8 on the VAS Scale.    Do you feel your functional level is improved while on opioid medication? "Yes"    When was the last time you took your pain medication?  Last night, Norco and Ibuprofen    Ambulates with the assistance of?:  Straight cane    Comments:  Complains of paresthesias right knee    Quality of Life Score-10    BP 142/70   Pulse 70   Temp(Src) 97.7 ??F (36.5 ??C) (Oral)   Resp 20    L.Steinbeck RN        HISTORY OF PRESENT ILLNESS:  Jean Ball presents to [facility] today, 06/28/2012 for continued evaluation of her chronic pain.    Subjective:  See note above.  Jean Ball complains of left knee pain.  She states it is aggravated with activity and controlled with the current pain regimen which includes Norco and Ibuprofen and is prescribed by this office.  She denies any side effects or complications from the medications.  She denies any loss of bowel or bladder, new neurological symptoms or interval weakness. She states that the medication helps her with her daily functions.     MEDICATIONS:  Current outpatient prescriptions:HYDROcodone-acetaminophen (NORCO) 7.5-325 MG per tablet, Take 1 tablet by mouth every 8 hours as needed for Pain for 30 days. DO NOT FILL UNTIL 07-01-12, Disp: 90 tablet, Rfl: 0;  ibuprofen (IBU) 600 MG tablet, Take 1 tablet by mouth every 8 hours as needed for Pain for 30 days. Take with food.  DO NOT FILL UNTIL 07-01-12, Disp: 90 tablet, Rfl: 0  lisinopril (PRINIVIL;ZESTRIL) 10 MG tablet, Take  by mouth daily., Disp: , Rfl: ;   promethazine-codeine (PHENERGAN WITH CODEINE) 6.25-10 MG/5ML syrup, Take 5 mLs by mouth 4 times daily as needed for Cough., Disp: 120 mL, Rfl: 0;  EXPIRED: albuterol (PROVENTIL HFA) 108 (90 BASE) MCG/ACT inhaler, Inhale 2 puffs into the lungs every 6 hours as needed for Wheezing for 7 days., Disp: 1 Inhaler, Rfl: 0  OMEGA-3 KRILL OIL 300 MG CAPS, Take 1 tablet by mouth daily.  , Disp: , Rfl: ;  EXPIRED: Diclofenac Sodium (PENNSAID) 1.5 % SOLN, Place 40 drops onto the skin 2 times daily as needed for 30 days., Disp: 3 Bottle, Rfl: 3;  aspirin 325 MG tablet, Take 325 mg by mouth daily.  , Disp: , Rfl: ;  Multiple Vitamins-Minerals (ONE-A-DAY WOMENS 50 PLUS) TABS, Take 1 tablet by mouth daily.  , Disp: , Rfl:   Flaxseed, Linseed, (FLAX SEED OIL) 1000 MG CAPS, Take 1 tablet by mouth daily.  , Disp: , Rfl:     ALLERGIES:  Jean Ball is allergic to keflex.    SOCIAL HISTORY:  Jean Ball  reports that she has been smoking Cigarettes.  She has a 30 pack-year smoking history. She does not have any smokeless tobacco history on file. She reports that  drinks alcohol. She reports that she  does not use illicit drugs.    PHYSICAL EXAMINATION:    Her  oral temperature is 97.7 ??F (36.5 ??C). Her blood pressure is 142/70 and her pulse is 70. Her respiration is 20.   General Appearance: alert and oriented to person, place and time, well-developed and well-nourished, in no acute distress  Neck: neck supple and non tender without mass, no thyromegaly or thyroid nodules, no cervical lymphadenopathy normal C-spine, no tenderness, FROM without pain, normal neurological exam of arms; normal DTRs, motor, sensory exam   Back:  full range of motion without pain, no tenderness, no spasm, no curvature   Pulmonary/Chest:  clear to auscultation bilaterally   Cardiovascular: normal rate, regular rhythm, normal S1 and S2, no murmurs, rubs, clicks or gallops, distal pulses intact, no carotid bruits   Abdomen:  soft, non-tender; bowel sounds normal; no masses,   no organomegaly  Extremities: Left knee with well healed surgical scars as well as knee brace  Musculoskeletal: Tenderness over left knee(s), Swelling over left knee(s)   Neurologic: reflexes normal and symmetric, no cranial nerve deficit, antalgic gait with cane assistance, coordination and speech normal     Patient Active Problem List:     Medial meniscus tear, Left knee     Lateral meniscus tear, Left knee     Left knee DJD     Chondromalacia of patella, left     Sprain and strain of knee and leg     PLAN:       1.  Medications: The patient's opioid agreement was reviewed.The patient shows no evidence of non-compliance. We did provide the patient with a refill of:  Orders Placed This Encounter   Medications   ??? HYDROcodone-acetaminophen (NORCO) 7.5-325 MG per tablet     Sig: Take 1 tablet by mouth every 8 hours as needed for Pain for 30 days. DO NOT FILL UNTIL 07-01-12     Dispense:  90 tablet     Refill:  0     ICD-9: 836.0, 836.1, 715.96, 884.9, 717.7   ??? ibuprofen (IBU) 600 MG tablet     Sig: Take 1 tablet by mouth every 8 hours as needed for Pain for 30 days. Take with food.  DO NOT FILL UNTIL 07-01-12     Dispense:  90 tablet     Refill:  0     ICD-9: 836.0, 836.1, 715.96, 884.9, 717.7     2.   Imaging: None needed at this time  3.  Physical Therapy:  Home exercise program encouraged  4.  Interventions:  No procedures needed at this time  5.  Referrals:  None at this time  6.  Ongoing Care:  We will see the patient in 1 month or sooner if they have any problems before that time. I encouraged the patient to call our office with any questions.    Clint Bolder, PA

## 2012-07-27 MED ORDER — HYDROCODONE-ACETAMINOPHEN 7.5-325 MG PO TABS
ORAL_TABLET | Freq: Three times a day (TID) | ORAL | Status: DC | PRN
Start: 2012-07-27 — End: 2012-08-12

## 2012-07-27 MED ORDER — IBUPROFEN 600 MG PO TABS
600 MG | ORAL_TABLET | Freq: Three times a day (TID) | ORAL | Status: DC | PRN
Start: 2012-07-27 — End: 2012-08-12

## 2012-07-27 NOTE — Progress Notes (Signed)
07/27/2012    Jean Ball    Last visit to Pain Management?  06/28/2012    Any new medical conditions since your last visit?  No new medical problems    If yes, explain:  n/a    Any new medications since your last visit? Off Lisinopril and on Losartan  (See updated med summary sheet)    Describe your pain:  (Location, type, intensity, duration)  Left knee    Pain is a 8/10 on the VAS Scale.    Do you feel your functional level is improved while on opioid medication? "Yes"    When was the last time you took your pain medication?  This AM at 0230, Norco    Ambulates with the assistance of?:  Straight cane    Comments:  denies any paresthesias  Wearing a left knee brace     BP 136/66   Pulse 72   Temp(Src) 98.1 ??F (36.7 ??C) (Oral)   Resp 16    L.Steinbeck RN        Jean Ball    07/27/2012    HISTORY OF PRESENT ILLNESS:  The patient presents today the [name] today, 07/27/2012 for continued evaluation of   Patient Active Problem List   Diagnosis   ??? Medial meniscus tear, Left knee   ??? Lateral meniscus tear, Left knee   ??? Left knee DJD   ??? Chondromalacia of patella, left   ??? Sprain and strain of knee and leg   .    Subjective: See above note. The patient states it is aggravated with activity and controlled with the current pain regimen which includes Ibuprofen and Norco is prescribed by this office. The patient denies any side effects or complications from the medications. The patient denies any loss of bowel or bladder, new neurological symptoms or interval weakness.Patient states that the wet weather has increased her left knee pain this month. Patient is not infested in any injections or PT at this time. Patient states that her current pain medications helps her stay active and function.    The patient???s Review of Systems, Family History, Medical History, and Social History were reviewed with the patient and other than the above changes listed there is no difference from the previous exam.        Medications and allergies have been reviewed in detail with the patient.  There are no changes here either.  Please see the medication reconciliation sheet in the chart for full details of medications and allergies.    PHYSICAL EXAMINATION:    VITAL SIGNS: BP 136/66   Pulse 72   Temp(Src) 98.1 ??F (36.7 ??C) (Oral)   Resp 16    Neck: tenderness over lower cervical spine and nuchal area, normal neurological exam of arms; normal DTRs, motor, sensory exam   Back: inspection and palpation: paraspinal tenderness noted bilateral lower lumbar and muscle tone and ROM exam: limited range of motion with pain, antalgic gait   Lungs: clear to auscultation bilaterally   Heart: regular rate and rhythm, S1, S2 normal, no murmur, click, rub or gallop   Abdomen: soft, non-tender; bowel sounds normal; no masses, no organomegaly   Extremities: extremities normal, atraumatic, no cyanosis or edema brace on left knee, tender on medial and lateral aspects of left knee no swelling noted   Pulses:2+ and symmetric   Neurologic: Patient is alert and oriented times three.uses a cane for support. Reflexes normal and symmetric. Sensation grossly normalspeech normal, mental status  intact, cranial nerves 2-12 intact, gait, including heel, toe, and tandem walking normal, muscle tone normal   Motor:Motor exam is symmetrical 5 out of 5 all extremities bilaterally    Patient Active Problem List:     Medial meniscus tear, Left knee     Lateral meniscus tear, Left knee     Left knee DJD     Chondromalacia of patella, left     Sprain and strain of knee and leg       PLAN:       1.  Medications: The patient's opioid agreement was reviewed.The patient shows no evidence of non-compliance. We did provide the patient with a refill of   Orders Placed This Encounter   Medications   ??? ibuprofen (IBU) 600 MG tablet     Sig: Take 1 tablet by mouth every 8 hours as needed for Pain for 30 days. Take with food.  DO NOT FILL UNTIL 07-29-12     Dispense:  90 tablet      Refill:  0     ICD-9: 836.0, 836.1, 715.96, 884.9, 717.7   ??? HYDROcodone-acetaminophen (NORCO) 7.5-325 MG per tablet     Sig: Take 1 tablet by mouth every 8 hours as needed for Pain for 30 days. DO NOT FILL UNTIL 07-29-12     Dispense:  90 tablet     Refill:  0     ICD-9: 836.0, 836.1, 715.96, 884.9, 717.7     2.   Imaging: none needed today  3.  Physical Therapy: home exercises encouraged   4.  Interventions: no procedures needed at this time  5.  Referrals:none needed at this time  6.  Ongoing Care: We will see the patient in 1 month or sooner if they have any problems before that time. I encouraged the patient to call our office with any questions.    Ahad Colarusso McManusACNS-BC

## 2012-07-29 LAB — COMPREHENSIVE METABOLIC PANEL
ALT: 33 U/L (ref 10–49)
AST: 27 U/L (ref 0–33)
Albumin: 4.5 g/dL (ref 3.2–4.8)
Alkaline Phosphatase: 96 U/L (ref 45–129)
BUN: 18 mg/dL (ref 9–23)
CO2: 32 mmol/L — ABNORMAL HIGH (ref 20–31)
Calcium: 9 mg/dL (ref 8.6–10.5)
Chloride: 107 mmol/L (ref 99–109)
Creatinine: 0.7 mg/dL (ref 0.5–1.1)
Glucose: 91 mg/dL (ref 70–110)
Potassium: 3.8 mmol/L (ref 3.5–5.5)
Sodium: 141 mmol/L (ref 132–146)
Total Bilirubin: 0.6 mg/dL (ref 0.3–1.2)
Total Protein: 6.8 g/dL (ref 5.7–8.2)

## 2012-07-29 LAB — LIPID PANEL
Cholesterol: 208 mg/dL — ABNORMAL HIGH (ref 0–199)
HDL: 43 mg/dL (ref >40.0)
LDL Calculated: 145 mg/dL — ABNORMAL HIGH (ref 0–99)
Triglycerides: 102 mg/dL (ref 0–149)

## 2012-07-29 LAB — GFR CALCULATED: Gfr Calculated: >60 mL/min/{1.73_m2} (ref >=60)

## 2012-08-25 MED ORDER — IBUPROFEN 600 MG PO TABS
600 MG | ORAL_TABLET | Freq: Three times a day (TID) | ORAL | Status: DC | PRN
Start: 2012-08-25 — End: 2012-08-25

## 2012-08-25 MED ORDER — HYDROCODONE-ACETAMINOPHEN 7.5-325 MG PO TABS
ORAL_TABLET | Freq: Three times a day (TID) | ORAL | Status: DC | PRN
Start: 2012-08-25 — End: 2012-08-25

## 2012-08-30 MED ORDER — HYDROCODONE-ACETAMINOPHEN 7.5-325 MG PO TABS
ORAL_TABLET | Freq: Three times a day (TID) | ORAL | Status: DC | PRN
Start: 2012-08-30 — End: 2012-09-20

## 2012-08-30 MED ORDER — IBUPROFEN 600 MG PO TABS
600 MG | ORAL_TABLET | Freq: Three times a day (TID) | ORAL | Status: DC | PRN
Start: 2012-08-30 — End: 2012-12-19

## 2012-08-30 NOTE — Progress Notes (Signed)
08/30/2012    Jean Ball    Last visit to Pain Management?  07/27/2012    Any new medical conditions since your last visit?  No new medical problems    If yes, explain:  n/a    Any new medications since your last visit? No new medications  (See updated med summary sheet)    Describe your pain:  (Location, type, intensity, duration)  Left knee    Pain is a 8/10 on the VAS Scale.    Do you feel your functional level is improved while on opioid medication? "Yes"    When was the last time you took your pain medication?  Last night, Norco and Ibuprofen    Comments:   Ambulating with a straight cane.  Complains of paresthesias outer left leg.  Wearing a left knee brace.    BP 128/72   Pulse 80   Temp(Src) 98.6 ??F (37 ??C) (Oral)   Resp 24    L.Steinbeck RN        HISTORY OF PRESENT ILLNESS:  The patient presents at the [facility] today, 08/30/2012, for continued evaluation of:  Patient Active Problem List   Diagnosis   ??? Medial meniscus tear, Left knee   ??? Lateral meniscus tear, Left knee   ??? Left knee DJD   ??? Chondromalacia of patella, left   ??? Sprain and strain of knee and leg       SUBJECTIVE: The patient complains of the above.  The patient denies any side effects or complications from the medications. The patient denies any loss of bowel or bladder, new neurological symptoms or interval weakness.  She notes that at times she has swelling of her left knee and sometimes the LE and ankle swell. It is relieved by elevating the extremity.    REVIEW OF SYSTEMS: Family History, Medical History, and Social History were reviewed with the patient and other than the above changes listed there is no difference from the previous exam.     Medications and allergies have been reviewed in detail with the patient.  There are no changes here either.  Please see the medication reconciliation sheet in the chart for full details of medications and allergies.    Current outpatient prescriptions:ibuprofen (IBU) 600 MG  tablet, Take 1 tablet by mouth every 8 hours as needed for Pain for 120 days. Take with food., Disp: 90 tablet, Rfl: 3;  HYDROcodone-acetaminophen (NORCO) 7.5-325 MG per tablet, Take 1 tablet by mouth every 8 hours as needed for Pain for 30 days., Disp: 90 tablet, Rfl: 0;  losartan-hydrochlorothiazide (HYZAAR) 50-12.5 MG per tablet, Take 1 tablet by mouth daily., Disp: , Rfl:   promethazine-codeine (PHENERGAN WITH CODEINE) 6.25-10 MG/5ML syrup, Take 5 mLs by mouth 4 times daily as needed for Cough., Disp: 120 mL, Rfl: 0;  albuterol (PROVENTIL HFA) 108 (90 BASE) MCG/ACT inhaler, Inhale 2 puffs into the lungs every 6 hours as needed for Wheezing for 7 days., Disp: 1 Inhaler, Rfl: 0;  OMEGA-3 KRILL OIL 300 MG CAPS, Take 1 tablet by mouth daily.  , Disp: , Rfl:   Diclofenac Sodium (PENNSAID) 1.5 % SOLN, Place 40 drops onto the skin 2 times daily as needed for 30 days., Disp: 3 Bottle, Rfl: 3;  aspirin 325 MG tablet, Take 325 mg by mouth daily.  , Disp: , Rfl: ;  Multiple Vitamins-Minerals (ONE-A-DAY WOMENS 50 PLUS) TABS, Take 1 tablet by mouth daily.  , Disp: , Rfl: ;  Flaxseed,  Linseed, (FLAX SEED OIL) 1000 MG CAPS, Take 1 tablet by mouth daily.  , Disp: , Rfl:     PHYSICAL EXAMINATION:    VITAL SIGNS: BP 128/72   Pulse 80   Temp(Src) 98.6 ??F (37 ??C) (Oral)   Resp 24    General Appearance:  alert and oriented to person, place and time, well-developed and well-nourished, in no acute distress    Head: normocephalic, atraumatic    Neck: no adenopathy, no carotid bruit, no JVD, supple, symmetrical, trachea midline and thyroid not enlarged, symmetric, no tenderness/mass/nodules, normal C-spine, no tenderness, FROM without pain, normal neurological exam of arms; normal DTRs, motor, sensory exam    Back: full range of motion without pain, no tenderness, no spasm, no curvature kyphosis absent    Lungs: clear to auscultation bilaterally    Heart: regular rate and rhythm, S1, S2 normal, no murmur, click, rub or  gallop    Abdomen: soft, non-tender; bowel sounds normal; no masses,  no organomegaly    Extremities: edema mild left knee, Homans sign is negative, no sign of DVT and presents with brace on left knee decreased ROM, tenderness to palpation, decreased strength of LE 4/5, antalgic gait using cane limited range of motion    Pulses: Peripheral vascular pulses were palpable bilaterally.     Neurologic: Patient is alert and oriented times three. Cranial nerves 2-12 are grossly intact.  reflexes abnormal- deferred left knee, left ankle absent    Psychiatric:  cooperative    Motor: Muscle strength is 5/5 bilaterally in the upper and lower extremities. Sensation is intact to light touch throughout in the upper and lower extremities.     Palpation:    Cervical paraspinal musculature bilateral is not tender to palpation and without radiation..     Thoracic paraspinal musculature bilateral is not tender to palpation.     Lumbar paraspinal musculature bilateral is not tender to palpation and does not radiate.    PROVOCATIVE MANEUVERS:   Facet Load:   Pain noted with facet loading of the lumbar spine to neither side    Sacroiliac joints bilateral are negative for tenderness to palpation. bilateral upper trochanterics are negative for tenderness upon palpation.     Patient Active Problem List   Diagnosis   ??? Medial meniscus tear, Left knee   ??? Lateral meniscus tear, Left knee   ??? Left knee DJD   ??? Chondromalacia of patella, left   ??? Sprain and strain of knee and leg        PLAN:                   1. Medications: The patient's opioid agreement was reviewed. The patient shows               no evidence of non-compliance. We did provide the patient with refills of                              Orders Placed This Encounter   Medications   ??? ibuprofen (IBU) 600 MG tablet     Sig: Take 1 tablet by mouth every 8 hours as needed for Pain for 120 days. Take with food.     Dispense:  90 tablet     Refill:  3     ICD-9: 836.0, 836.1, 715.96,  884.9, 717.7   ??? HYDROcodone-acetaminophen (NORCO) 7.5-325 MG per tablet     Sig: Take 1 tablet  by mouth every 8 hours as needed for Pain for 30 days.     Dispense:  90 tablet     Refill:  0     ICD-9: 836.0, 836.1, 715.96, 884.9, 717.7   .                          2.  Imaging:  N/A unless noted otherwise         3.  Physical Therapy:  N/A unless noted otherwise         4.  Interventions:  N/A unless noted otherwise         5.  Referrals:  N/A unless noted otherwise         6.  Ongoing Care:  We will see the patient in the office in 1 month or sooner if the patient has any problems before that time.  I                           encouraged them to call our office with any questions.                                                                   Electronically signed by Nehemiah Settle

## 2012-09-29 MED ORDER — HYDROCODONE-ACETAMINOPHEN 7.5-325 MG PO TABS
ORAL_TABLET | Freq: Three times a day (TID) | ORAL | Status: DC | PRN
Start: 2012-09-29 — End: 2012-10-19

## 2012-09-29 NOTE — Progress Notes (Signed)
09/29/2012    Jean Ball    Last visit to Pain Management?  08/30/2012    Any new medical conditions since your last visit?  yes    If yes, explain:  Seeing a retina specialist and receiving shots in the right eye.    Any new medications since your last visit? no  (See updated med summary sheet)    Describe your pain:  (Location, type, intensity, duration)  She is complaining of pain to her left knee.  She has a brace on the left knee    Pain is a 8/10 on the VAS Scale.    Do you feel your functional level is improved while on opioid medication? yes    When was the last time you took your pain medication?  norco 0330 this morning    Comments:  She ambulates with an assist of a straight cane.  Her gait is slow and steady.    BP 152/72   Pulse 78   Temp(Src) 98 ??F (36.7 ??C) (Oral)   Resp 16            Jean Ball    09/29/2012    HISTORY OF PRESENT ILLNESS:  The patient presents today the [name] today, 09/29/2012 for continued evaluation of   Patient Active Problem List   Diagnosis   ??? Medial meniscus tear, Left knee   ??? Lateral meniscus tear, Left knee   ??? Left knee DJD   ??? Chondromalacia of patella, left   ??? Sprain and strain of knee and leg   .    Subjective: See above note. The patient states it is aggravated with activity and controlled with the current pain regimen which includes norco and is prescribed by this office. The patient denies any side effects or complications from the medications. The patient denies any loss of bowel or bladder, new neurological symptoms or interval weakness. The patient states the norco continues to allow her to function at home.     The patient???s Review of Systems, Family History, Medical History, and Social History were reviewed with the patient and other than the above changes listed there is no difference from the previous exam.       Medications and allergies have been reviewed in detail with the patient.  There are no changes here either.  Please see  the medication reconciliation sheet in the chart for full details of medications and allergies.    PHYSICAL EXAMINATION:    VITAL SIGNS: BP 152/72   Pulse 78   Temp(Src) 98 ??F (36.7 ??C) (Oral)   Resp 16  Brace to left knee  Neck: normal C-spine, no tenderness, FROM without pain, normal neurological exam of arms; normal DTRs, motor, sensory exam  Back: full range of motion without pain, no tenderness, no spasm, no curvature  Lungs: clear to auscultation bilaterally  Heart: regular rate and rhythm, S1, S2 normal, no murmur, click, rub or gallop  Abdomen: soft, non-tender; bowel sounds normal; no masses,  no organomegaly  Extremities: edema left knee with tenderness in the lateral aspect of the knee > medial aspect  Pulses:2+ and symmetric   Neurologic: Patient is alert and oriented times three. positive findings: abnormal muscle tone right lower extremity, muscular weakness left legspeech normal, mental status intact, cranial nerves 2-12 intact  Motor:Motor exam is 5 out of 5 all extremities with the exception of left leg 4/5  Provacative Maneuvers:    Patient Active Problem List:  Medial meniscus tear, Left knee     Lateral meniscus tear, Left knee     Left knee DJD     Chondromalacia of patella, left     Sprain and strain of knee and leg       PLAN:       1.  Medications: The patient's opioid agreement was reviewed.The patient shows no evidence of non-compliance. We did provide the patient with a refill of   Orders Placed This Encounter   Medications   ??? HYDROcodone-acetaminophen (NORCO) 7.5-325 MG per tablet     Sig: Take 1 tablet by mouth every 8 hours as needed for Pain for 30 days.     Dispense:  90 tablet     Refill:  0     ICD-9: 836.0, 836.1, 715.96, 884.9, 717.7     2.   Imaging: none needed today  3.  Physical Therapy: home exercises encouraged   4.  Interventions: no procedures needed at this time  5.  Referrals:none needed at this time  6.  Ongoing Care: We will see the patient in 1 month or sooner if  they have any problems before that time. I encouraged the patient to call our office with any questions. Jean Ball was given a prescription for lab work including liver enzymes, BUN, Creatinine and a random Urine Drug Screen.  She was instructed to go to the outpatient lab immediately after leaving the office.  It was explained in detail that if the drug screen is not done TODAY, is inappropriate for these medications or any illicit drugs she will not be a candidate for our opioid program or may be grounds for dismissal from our opioid program.  She verbalized understanding.         Monika Salk, RNCNP

## 2012-09-30 LAB — URINE DRUG SCREEN
Amphetamine Screen, Urine: NOT DETECTED ng/mL
Barbiturate Screen, Ur: NOT DETECTED ng/mL (ref ?–200)
Benzodiazepine Screen, Urine: NOT DETECTED ng/mL (ref ?–200)
Cannabinoids: NOT DETECTED ng/mL
Cocaine Metabolites, Ur: NOT DETECTED ng/mL (ref ?–300)
Methadone Screen, Urine: NOT DETECTED ng/mL (ref ?–300)
Opiate Scrn, Ur: POSITIVE ng/mL (ref ?–300)
PCP Screen, Urine: NOT DETECTED ng/mL
Propoxyphene Screen, Urine: NOT DETECTED ng/mL (ref ?–300)

## 2012-09-30 LAB — ETHANOL: Ethanol Lvl: <10 mg/dL

## 2012-09-30 LAB — HEPATIC FUNCTION PANEL
ALT: 19 U/L (ref 10–49)
AST: 20 U/L (ref 0–33)
Albumin: 4.5 g/dL (ref 3.2–4.8)
Alkaline Phosphatase: 80 U/L (ref 45–129)
Bilirubin, Direct: 0.1 mg/dL (ref 0.0–0.2)
Total Bilirubin: 0.3 mg/dL (ref 0.3–1.2)
Total Protein: 6.8 g/dL (ref 5.7–8.2)

## 2012-09-30 LAB — BUN: BUN: 23 mg/dL (ref 9–23)

## 2012-09-30 LAB — CREATININE: Creatinine: 0.7 mg/dL (ref 0.5–1.1)

## 2012-09-30 LAB — GFR CALCULATED: Gfr Calculated: >60 mL/min/{1.73_m2} (ref >=60)

## 2012-10-02 LAB — MISC TOXICOLOGY

## 2012-10-05 LAB — MISCELLANEOUS SENDOUT

## 2012-10-06 LAB — TRAMADOL, URINE
N-Desmethyltramadol,Ur: NEGATIVE
O-Desmethyltramadol,Ur: NEGATIVE
Tramadol, Urine: <50 ng/mL

## 2012-10-06 LAB — OPIATE, QUANTITATIVE, URINE

## 2012-10-08 NOTE — Telephone Encounter (Signed)
Patient had uds/labs done on 09/29/2012.  The results are appropriate and the labs within normal limits.  PLEASE SEE [facility].  Patient to be counseled regarding accepting scripts from other sources and sign a violation.

## 2012-10-26 MED ORDER — HYDROCODONE-ACETAMINOPHEN 7.5-325 MG PO TABS
ORAL_TABLET | Freq: Three times a day (TID) | ORAL | Status: DC | PRN
Start: 2012-10-26 — End: 2012-11-22

## 2012-10-26 NOTE — Progress Notes (Signed)
10/26/2012    Jean Ball    Last visit to Pain Management?  09/29/2012    Any new medical conditions since your last visit?  No      If yes, explain:      Any new medications since your last visit? No    (See updated med summary sheet)    Describe your pain:  (Location, type, intensity, duration) left knee,aching sharp shooting pain " right now it feels as if electricity is shooting through it"    Pain is a 9/10 on the VAS Scale.    Do you feel your functional level is improved while on opioid medication? yes    When was the last time you took your pain medication?  norco 7.5mg /325mg     Comments:  Violation signed counseled on opiate agreement     BP 120/70   Pulse 64   Resp 16   Ht 5\' 7"  (1.702 m)   Wt 175 lb (79.379 kg)   BMI 27.4 kg/m2    Jean Brammer RN          Jean Ball    10/26/2012    HISTORY OF PRESENT ILLNESS:  The patient presents today the [name] today, 10/26/2012 for continued evaluation of   Patient Active Problem List   Diagnosis   ??? Medial meniscus tear, Left knee   ??? Lateral meniscus tear, Left knee   ??? Left knee DJD   ??? Chondromalacia of patella, left   ??? Sprain and strain of knee and leg   .    Subjective: See above note. The patient states it is aggravated with activity and controlled with the current pain regimen which includes norco and is prescribed by this office. The patient denies any side effects or complications from the medications. The patient denies any loss of bowel or bladder, new neurological symptoms or interval weakness. The patient had lab work last month which was WNL and the UDS was appropriate. It was noted on the [place]AR[place] that the patient received vicodin from her PCP in 4/13 and she was counseled about this and she signed a violation. She states she has had more swelling in the left knee and ankle.    The patient???s Review of Systems, Family History, Medical History, and Social History were reviewed with the patient and other than the above changes  listed there is no difference from the previous exam.       Medications and allergies have been reviewed in detail with the patient.  There are no changes here either.  Please see the medication reconciliation sheet in the chart for full details of medications and allergies.    PHYSICAL EXAMINATION:    VITAL SIGNS: BP 120/70   Pulse 64   Resp 16   Ht 5\' 7"  (1.702 m)   Wt 175 lb (79.379 kg)   BMI 27.4 kg/m2  Wearing brace on left knee. Using cane  Neck: normal C-spine, no tenderness, FROM without pain, normal neurological exam of arms; normal DTRs, motor, sensory exam  Back: full range of motion without pain, no tenderness, no spasm, no curvature  Lungs: clear to auscultation bilaterally  Heart: regular rate and rhythm, S1, S2 normal, no murmur, click, rub or gallop  Abdomen: soft, non-tender; bowel sounds normal; no masses,  no organomegaly  Extremities: edema left knee and entire lower extremity including the foot   Pulses:2+ and symmetric   Neurologic: Patient is alert and oriented times three. Gait normal.  Reflexes normal and symmetric. Sensation grossly normalspeech normal, mental status intact, cranial nerves 2-12 intact  Motor:Motor exam is 5 out of 5 all extremities with the exception of left lower extremity is 4/5  Provacative Maneuvers:    Patient Active Problem List:     Medial meniscus tear, Left knee     Lateral meniscus tear, Left knee     Left knee DJD     Chondromalacia of patella, left     Sprain and strain of knee and leg       PLAN:   OARRS reviewed and is appropriate except for one script in 4/13 which the patient signed a violation.      1.  Medications: The patient's opioid agreement was reviewed.The patient shows no evidence of non-compliance. We did provide the patient with a refill of   Orders Placed This Encounter   Medications   ??? HYDROcodone-acetaminophen (NORCO) 7.5-325 MG per tablet     Sig: Take 1 tablet by mouth every 8 hours as needed for Pain for 30 days.     Dispense:  90 tablet      Refill:  0     ICD-9: 836.0, 836.1, 715.96, 884.9, 717.7     2.   Imaging: none needed today  3.  Physical Therapy: home exercises encouraged   4.  Interventions: no procedures needed at this time  5.  Referrals:none needed at this time  6.  Ongoing Care: We will see the patient in 1 month or sooner if they have any problems before that time. I encouraged the patient to call our office with any questions.    Monika Salk, RNCNP

## 2012-11-24 MED ORDER — HYDROCODONE-ACETAMINOPHEN 7.5-325 MG PO TABS
ORAL_TABLET | Freq: Three times a day (TID) | ORAL | Status: DC | PRN
Start: 2012-11-24 — End: 2012-12-19

## 2012-11-24 NOTE — Progress Notes (Signed)
11/24/2012    Jean Ball    Last visit to Pain Management?  10/26/2012    Any new medical conditions since your last visit?  Yes    If yes, explain:  Saw an eye Dr. Marland Kitchenhad a steroid injection in eye"    Any new medications since your last visit? No new medications  (See updated med summary sheet)    Describe your pain:  (Location, type, intensity, duration)  Left knee    Pain is a 8/10 on the VAS Scale.    Do you feel your functional level is improved while on opioid medication? "Yes"    When was the last time you took your pain medication?  This AM at 0530, Norco    Comments:  Wearing a left knee brace and ambulating with a straight cane.  Complains of "swelling down to her ankle for the last week"  Complains of paresthesias on outer side of left knee.    BP 147/80   Pulse 74   Temp(Src) 98.2 ??F (36.8 ??C) (Oral)   Resp 16    L.Steinbeck RN      HISTORY OF PRESENT ILLNESS:  Jean Ball presents to [facility] today, 11/24/2012 for continued evaluation of her chronic pain.    Subjective:  See note above.  Jean Ball complains of left knee pain.  She states it is aggravated with activity and controlled with the current pain regimen which includes Norco and is prescribed by this office.  She denies any side effects or complications from the medications.  She denies any loss of bowel or bladder, new neurological symptoms or interval weakness. She states that the medication allows her to continue doing her daily functions. She states that the weather has made her pain worse    MEDICATIONS:  Current outpatient prescriptions:HYDROcodone-acetaminophen (NORCO) 7.5-325 MG per tablet, Take 1 tablet by mouth every 8 hours as needed for Pain for 30 days., Disp: 90 tablet, Rfl: 0;  ibuprofen (IBU) 600 MG tablet, Take 1 tablet by mouth every 8 hours as needed for Pain for 120 days. Take with food., Disp: 90 tablet, Rfl: 3;  losartan-hydrochlorothiazide (HYZAAR) 50-12.5 MG per tablet, Take 1 tablet by mouth daily.,  Disp: , Rfl:   promethazine-codeine (PHENERGAN WITH CODEINE) 6.25-10 MG/5ML syrup, Take 5 mLs by mouth 4 times daily as needed for Cough., Disp: 120 mL, Rfl: 0;  albuterol (PROVENTIL HFA) 108 (90 BASE) MCG/ACT inhaler, Inhale 2 puffs into the lungs every 6 hours as needed for Wheezing for 7 days., Disp: 1 Inhaler, Rfl: 0;  OMEGA-3 KRILL OIL 300 MG CAPS, Take 1 tablet by mouth daily.  , Disp: , Rfl:   Diclofenac Sodium (PENNSAID) 1.5 % SOLN, Place 40 drops onto the skin 2 times daily as needed for 30 days., Disp: 3 Bottle, Rfl: 3;  aspirin 325 MG tablet, Take 325 mg by mouth daily.  , Disp: , Rfl: ;  Multiple Vitamins-Minerals (ONE-A-DAY WOMENS 50 PLUS) TABS, Take 1 tablet by mouth daily.  , Disp: , Rfl: ;  Flaxseed, Linseed, (FLAX SEED OIL) 1000 MG CAPS, Take 1 tablet by mouth daily.  , Disp: , Rfl:     ALLERGIES:  Jean Ball is allergic to keflex.    SOCIAL HISTORY:  Jean Ball  reports that she has been smoking Cigarettes.  She has a 30 pack-year smoking history. She does not have any smokeless tobacco history on file. She reports that  drinks alcohol. She reports that she does not use  illicit drugs.    PHYSICAL EXAMINATION:    Her  oral temperature is 98.2 ??F (36.8 ??C). Her blood pressure is 147/80 and her pulse is 74. Her respiration is 16.   General Appearance: alert and oriented to person, place and time, well-developed and well-nourished, in no acute distress  Neck: neck supple and non tender without mass, no thyromegaly or thyroid nodules, no cervical lymphadenopathy normal C-spine, no tenderness, FROM without pain, normal neurological exam of arms; normal DTRs, motor, sensory exam   Back:  full range of motion without pain, no tenderness, no spasm, no curvature   Pulmonary/Chest:  clear to auscultation bilaterally   Cardiovascular: normal rate, regular rhythm, normal S1 and S2, no murmurs, rubs, clicks or gallops, distal pulses intact, no carotid bruits   Abdomen:  soft, non-tender; bowel sounds normal; no masses,   no organomegaly  Extremities: edema mild noted over left knee and extremities normal, atraumatic, no cyanosis or edema  Musculoskeletal: Tenderness over left knee(s), Swelling over left knee(s), patient has knee brace on left side, antalgic gait with cane assitance   Neurologic: reflexes normal and symmetric, no cranial nerve deficit, gait, coordination and speech normal   Provocative Maneuvers:  Facet Loading, lumbar, negative, bilateral    Motor: Muscle strength is 5/5 bilaterally in the upper and lower extremities. Sensation is intact to light touch throughout in the upper and lower extremities.       Patient Active Problem List:     Medial meniscus tear, Left knee     Lateral meniscus tear, Left knee     Left knee DJD     Chondromalacia of patella, left     Sprain and strain of knee and leg     PLAN:       1.  Medications: The patient's opioid agreement was reviewed.The patient shows no evidence of non-compliance. We did provide the patient with a refill of:  Orders Placed This Encounter   Medications   ??? HYDROcodone-acetaminophen (NORCO) 7.5-325 MG per tablet     Sig: Take 1 tablet by mouth every 8 hours as needed for Pain for 30 days.     Dispense:  90 tablet     Refill:  0     ICD-9: 836.0, 836.1, 715.96, 884.9, 717.7     2.   Imaging:   None needed at this time  3.  Physical Therapy:  Home exercise program encouraged  4.  Interventions:  No procedures needed at this time  5.  Referrals:  None at this time  6.  Ongoing Care:  We will see the patient in 1 month or sooner if they have any problems before that time. I encouraged the patient to call our office with any questions.    Clint Bolder, PA

## 2012-12-21 MED ORDER — IBUPROFEN 600 MG PO TABS
600 MG | ORAL_TABLET | Freq: Three times a day (TID) | ORAL | Status: DC | PRN
Start: 2012-12-21 — End: 2013-04-04

## 2012-12-21 MED ORDER — HYDROCODONE-ACETAMINOPHEN 7.5-325 MG PO TABS
ORAL_TABLET | Freq: Three times a day (TID) | ORAL | Status: DC | PRN
Start: 2012-12-21 — End: 2013-01-09

## 2012-12-21 NOTE — Progress Notes (Signed)
12/21/2012    Jean Ball    Last visit to Pain Management? 11/24/2012    Any new medical conditions since your last visit?  no    If yes, explain:  na    Any new medications since your last visit? none  (See updated med summary sheet)    Describe your pain:  (Location, type, intensity, duration)  C/o pain in left knee that sometimes radiates upwards to hip as well as down to lower leg also it swells up    Pain is a 7/10 on the VAS Scale.    Do you feel your functional level is improved while on opioid medication? yes    When was the last time you took your pain medication?  norco 330 am    Comments:  Left knee walking brace and straight cane    BP 130/70   Pulse 80   Temp(Src) 98.2 ??F (36.8 ??C)   Resp 16    Billie Brammer RN          Jean Ball    12/21/2012    HISTORY OF PRESENT ILLNESS:  The patient presents today the [name] today, 12/21/2012 for continued evaluation of   Patient Active Problem List   Diagnosis   ??? Medial meniscus tear, Left knee   ??? Lateral meniscus tear, Left knee   ??? Left knee DJD   ??? Chondromalacia of patella, left   ??? Sprain and strain of knee and leg   .    Subjective: See above note. The patient states it is aggravated with activity and controlled with the current pain regimen which includes norco, ibuprofen and is prescribed by this office. The patient denies any side effects or complications from the medications. The patient denies any loss of bowel or bladder, new neurological symptoms or interval weakness. The patient states the meds continue to allow her to function at home. She needs new TENS supplies and was advised to see Dr. Yetta Barre for this as he gave her the TENS.    The patient???s Review of Systems, Family History, Medical History, and Social History were reviewed with the patient and other than the above changes listed there is no difference from the previous exam.       Medications and allergies have been reviewed in detail with the patient.  There are  no changes here either.  Please see the medication reconciliation sheet in the chart for full details of medications and allergies.    PHYSICAL EXAMINATION:    VITAL SIGNS: BP 130/70   Pulse 80   Temp(Src) 98.2 ??F (36.8 ??C)   Resp 16  Wearing a brace to the left leg  Neck: normal C-spine, no tenderness, FROM without pain, normal neurological exam of arms; normal DTRs, motor, sensory exam  Back: inspection and palpation: inspection of back is normal and muscle tone and ROM exam: muscle tone normal without spasm  Lungs: clear to auscultation bilaterally  Heart: regular rate and rhythm, S1, S2 normal, no murmur, click, rub or gallop  Abdomen: soft, non-tender; bowel sounds normal; no masses,  no organomegaly  Extremities: edema mild in left knee  Pulses:2+ and symmetric   Neurologic: Patient is alert and oriented times three. positive findings: muscular weakness left lower extremityspeech normal, mental status intact, cranial nerves 2-12 intact  Motor:Motor exam is 5 out of 5 all extremities with the exception of left knee and leg  Provacative Maneuvers:    Patient Active Problem List:  Medial meniscus tear, Left knee     Lateral meniscus tear, Left knee     Left knee DJD     Chondromalacia of patella, left     Sprain and strain of knee and leg       PLAN:       1.  Medications: The patient's opioid agreement was reviewed.The patient shows no evidence of non-compliance. We did provide the patient with a refill of   Orders Placed This Encounter   Medications   ??? HYDROcodone-acetaminophen (NORCO) 7.5-325 MG per tablet     Sig: Take 1 tablet by mouth every 8 hours as needed for Pain for 30 days. DO NOT FILL UNTIL 12-23-12     Dispense:  90 tablet     Refill:  0     ICD-9: 836.0, 836.1, 715.96, 884.9, 717.7   ??? ibuprofen (IBU) 600 MG tablet     Sig: Take 1 tablet by mouth every 8 hours as needed for Pain for 120 days. Take with food.  DO NOT FILL UNTIL 12-28-12     Dispense:  90 tablet     Refill:  3     ICD-9: 836.0,  836.1, 715.96, 884.9, 717.7     2.   Imaging: none needed today  3.  Physical Therapy: home exercises encouraged   4.  Interventions: no procedures needed at this time  5.  Referrals:none needed at this time  6.  Ongoing Care: We will see the patient in 1 month or sooner if they have any problems before that time. I encouraged the patient to call our office with any questions.    Monika Salk, RNCNP

## 2013-01-23 MED ORDER — HYDROCODONE-ACETAMINOPHEN 7.5-325 MG PO TABS
ORAL_TABLET | Freq: Three times a day (TID) | ORAL | Status: DC | PRN
Start: 2013-01-23 — End: 2013-02-06

## 2013-01-23 NOTE — Progress Notes (Signed)
Last seen:12/21/12    Patient states there has been no changes to her health history or medication since last office visit.    Pain location: C/o pain in left knee that sometimes radiates upwards to hip as well as down to lower leg also it swells up.  Wears brace to her left knee90%of the time   uses cane to assist with ambulation    Patient is requesting refill of her pain medications which is currently norco.  Ibuprofen with refills remaining  Last dose of pain medication:this am     On the VAS pain scale patient states pain is a 10  at its worst and 7at its best    BP 125/72   Pulse 80   Temp(Src) 98.2 ??F (36.8 ??C) (Oral)   Resp 20  Riann Oman    01/23/2013    HISTORY OF PRESENT ILLNESS:  The patient presents today the [name] today, 01/23/2013 for continued evaluation of   Patient Active Problem List   Diagnosis   ??? [facility] Medial meniscus tear, Left knee   ??? [facility] Lateral meniscus tear, Left knee   ??? [facility] Left knee DJD   ??? [facility] Chondromalacia of patella, left   ??? [facility] Sprain and strain of knee and leg   .    Subjective: See above note. The patient states the pain in the left knee is aggravated with activity and controlled with the current pain regimen which includes norco and ibuprofen and is prescribed by this office. The patient denies any side effects or complications from the medications. The patient denies any loss of bowel or bladder, new neurological symptoms or interval weakness. The patient states the pain has been radiating up into the left thigh for a few weeks. She continues to wear the brace on the left knee while she is up and about. The norco does allow her to function at home.    The patient???s Review of Systems, Family History, Medical History, and Social History were reviewed with the patient and other than the above changes listed there is no difference from the previous exam.       Medications and allergies have been reviewed in detail with the patient.  There are no  changes here either.  Please see the medication reconciliation sheet in the chart for full details of medications and allergies.    PHYSICAL EXAMINATION:    VITAL SIGNS: BP 125/72   Pulse 80   Temp(Src) 98.2 ??F (36.8 ??C) (Oral)   Resp 20  Using a cane  Neck: normal C-spine, no tenderness, FROM without pain, normal neurological exam of arms; normal DTRs, motor, sensory exam  Back: full range of motion without pain, no tenderness, no spasm, no curvature  Lungs: diminished breath sounds posterior - bilateral  Heart: regular rate and rhythm, S1, S2 normal, no murmur, click, rub or gallop  Abdomen: soft, non-tender; bowel sounds normal; no masses,  no organomegaly  Extremities: edema mild in left knee  Pulses:2+ and symmetric   Neurologic: Patient is alert and oriented times three. Gait normal. Reflexes normal and symmetric. Sensation grossly normalspeech normal, mental status intact, cranial nerves 2-12 intact  Motor:Motor exam is 5 out of 5 all extremities with the exception of left knee which is tender to palpation in the lateral and distal aspect of the joint with some decreased ROM  Provacative Maneuvers:    Patient Active Problem List:     [facility] Medial meniscus tear, Left knee  [facility] Lateral meniscus tear, Left knee     [facility] Left knee DJD     [facility] Chondromalacia of patella, left     [facility] Sprain and strain of knee and leg       PLAN:       1.  Medications: The patient's opioid agreement was reviewed.The patient shows no evidence of non-compliance. We did provide the patient with a refill of   Orders Placed This Encounter   Medications   ??? HYDROcodone-acetaminophen (NORCO) 7.5-325 MG per tablet     Sig: Take 1 tablet by mouth every 8 hours as needed for Pain for 30 days.     Dispense:  90 tablet     Refill:  0     ICD-9: 836.0, 836.1, 715.96, 884.9, 717.7     2.   Imaging: none needed today  3.  Physical Therapy: home exercises encouraged , may consider more PT in the future  4.  Interventions: no procedures needed  at this time  5.  Referrals:none needed at this time  6.  Ongoing Care: We will see the patient in 1 month or sooner if they have any problems before that time. I encouraged the patient to call our office with any questions.    Monika Salk, RNCNP

## 2013-02-20 MED ORDER — HYDROCODONE-ACETAMINOPHEN 7.5-325 MG PO TABS
ORAL_TABLET | Freq: Three times a day (TID) | ORAL | Status: DC | PRN
Start: 2013-02-20 — End: 2013-03-06

## 2013-02-20 NOTE — Progress Notes (Signed)
02/20/2013    Jean Ball    Last visit to Pain Management?  01/23/2013.    Any new medical conditions since your last visit?  no    If yes, explain:      Any new medications since your last visit? no  (See updated med summary sheet)    Describe your pain:  (Location, type, intensity, duration)  She is complaining of pain to her left knee radiating at times up into lateral aspect thigh.  She ambulates with an assist of a straight cane.  Her gait is slow and steady.  She is wearing a brace to her left knee.    Pain is a 7/10 on the VAS Scale.    Do you feel your functional level is improved while on opioid medication? yes    When was the last time you took your pain medication?  norco this morning 0600.    Comments:      BP 120/64   Pulse 60   Temp(Src) 98.3 ??F (36.8 ??C) (Oral)   Resp 16            HISTORY OF PRESENT ILLNESS:  Jean Ball presents to [facility] today, 02/20/2013 for continued evaluation of her chronic pain.    Subjective:  See note above.  Jean Ball complains of left knee pain.  She states it is aggravated with activity and controlled with the current pain regimen which includes Norco and Ibuprofen and is prescribed by this office.  She denies any side effects or complications from the medications.  She denies any loss of bowel or bladder, new neurological symptoms or interval weakness. She states that medication allows her to continue doing her daily functions    MEDICATIONS:  Current outpatient prescriptions:[START ON 02/22/2013] HYDROcodone-acetaminophen (NORCO) 7.5-325 MG per tablet, Take 1 tablet by mouth every 8 hours as needed for Pain for 30 days. DO NOT FILL UNTIL 02-22-13, Disp: 90 tablet, Rfl: 0;  Psyllium-Calcium (METAMUCIL PLUS CALCIUM PO), Take  by mouth., Disp: , Rfl:   ibuprofen (IBU) 600 MG tablet, Take 1 tablet by mouth every 8 hours as needed for Pain for 120 days. Take with food.  DO NOT FILL UNTIL 12-28-12, Disp: 90 tablet, Rfl: 3;  losartan-hydrochlorothiazide  (HYZAAR) 50-12.5 MG per tablet, Take 1 tablet by mouth daily., Disp: , Rfl: ;  albuterol (PROVENTIL HFA) 108 (90 BASE) MCG/ACT inhaler, Inhale 2 puffs into the lungs every 6 hours as needed for Wheezing for 7 days., Disp: 1 Inhaler, Rfl: 0  OMEGA-3 KRILL OIL 300 MG CAPS, Take 1 tablet by mouth daily.  , Disp: , Rfl: ;  Diclofenac Sodium (PENNSAID) 1.5 % SOLN, Place 40 drops onto the skin 2 times daily as needed for 30 days., Disp: 3 Bottle, Rfl: 3;  aspirin 325 MG tablet, Take 325 mg by mouth daily.  , Disp: , Rfl: ;  Multiple Vitamins-Minerals (ONE-A-DAY WOMENS 50 PLUS) TABS, Take 1 tablet by mouth daily.  , Disp: , Rfl:   Flaxseed, Linseed, (FLAX SEED OIL) 1000 MG CAPS, Take 1 tablet by mouth daily.  , Disp: , Rfl:     ALLERGIES:  Arelia is allergic to cephalexin.    SOCIAL HISTORY:  Akaysha  reports that she has been smoking Cigarettes.  She has a 30 pack-year smoking history. She does not have any smokeless tobacco history on file. She reports that  drinks alcohol. She reports that she does not use illicit drugs.    PHYSICAL EXAMINATION:  Her  oral temperature is 98.3 ??F (36.8 ??C). Her blood pressure is 120/64 and her pulse is 60. Her respiration is 16.   General Appearance: alert and oriented to person, place and time, well-developed and well-nourished, in no acute distress  Neck: neck supple and non tender without mass, no thyromegaly or thyroid nodules, no cervical lymphadenopathy normal C-spine, no tenderness, FROM without pain, normal neurological exam of arms; normal DTRs, motor, sensory exam   Back:  full range of motion without pain, no tenderness, no spasm, no curvature   Pulmonary/Chest:  clear to auscultation bilaterally   Cardiovascular: normal rate, regular rhythm, normal S1 and S2, no murmurs, rubs, clicks or gallops, distal pulses intact, no carotid bruits   Abdomen:  soft, non-tender; bowel sounds normal; no masses,  no organomegaly  Extremities: extremities normal, atraumatic, no cyanosis or  edema  Musculoskeletal: Tenderness over left knee(s), limited ROM secondary to pain, patient wearing knee brace   Neurologic: reflexes normal and symmetric, no cranial nerve deficit, gait, coordination and speech normal   Provocative Maneuvers:  Facet Loading, lumbar, negative, bilateral    Motor: Muscle strength is 5/5 bilaterally in the upper and lower extremities. Sensation is intact to light touch throughout in the upper and lower extremities.       Patient Active Problem List:     SJPM Medial meniscus tear, Left knee     SJPM Lateral meniscus tear, Left knee     SJPM Left knee DJD     SJPM Chondromalacia of patella, left     SJPM Sprain and strain of knee and leg     PLAN:       1.  Medications: The patient's opioid agreement was reviewed.The patient shows no evidence of non-compliance. We did provide the patient with a refill of:  Orders Placed This Encounter   Medications   ??? HYDROcodone-acetaminophen (NORCO) 7.5-325 MG per tablet     Sig: Take 1 tablet by mouth every 8 hours as needed for Pain for 30 days. DO NOT FILL UNTIL 02-22-13     Dispense:  90 tablet     Refill:  0     ICD-9: 836.0, 836.1, 715.96, 884.9, 717.7     2.   Imaging:   None needed at this time  3.  Physical Therapy:  Home exercise program encouraged  4.  Interventions:  No procedures needed at this time  5.  Referrals:  None at this time  6.  Ongoing Care:  We will see the patient in 1 month or sooner if they have any problems before that time. I encouraged the patient to call our office with any questions.    Clint Bolder, PA

## 2013-03-20 MED ORDER — HYDROCODONE-ACETAMINOPHEN 7.5-325 MG PO TABS
ORAL_TABLET | Freq: Three times a day (TID) | ORAL | Status: DC | PRN
Start: 2013-03-20 — End: 2013-04-04

## 2013-03-20 NOTE — Progress Notes (Signed)
03/20/2013    Jean Ball    Last visit to Pain Management?  02-20-13    Any new medical conditions since your last visit?  yes    If yes, explain:  Had left eye stye   Was on antibiotics     Any new medications since your last visit? no  (See updated med summary sheet)    Describe your pain:  (Location, type, intensity, duration)  Left knee and thigh @ outer aspect    Pain is a 7/10 on the VAS Scale.    Do you feel your functional level is improved while on opioid medication? yes    When was the last time you took your pain medication?  norco   This am    Comments:  Uses cane    BP 112/64   Pulse 70   Resp 16    LP LPN        Stephonie Wilcoxen    03/20/2013    HISTORY OF PRESENT ILLNESS:  The patient presents today the [name] today, 03/20/2013 for continued evaluation of   Patient Active Problem List   Diagnosis   ??? SJPM Medial meniscus tear, Left knee   ??? SJPM Lateral meniscus tear, Left knee   ??? SJPM Left knee DJD   ??? SJPM Chondromalacia of patella, left   ??? SJPM Sprain and strain of knee and leg   .    Subjective: See above note. The patient states it is aggravated with activity and controlled with the current pain regimen which includes Norco and is prescribed by this office. The patient denies any side effects or complications from the medications. The patient denies any loss of bowel or bladder, new neurological symptoms or interval weakness. Patient states no change in her chronic pain condition. Patient states that her pain medications helps her pain in her left knee so that she can function. Patient states that she has to use a cane at all times for support.     The patient???s Review of Systems, Family History, Medical History, and Social History were reviewed with the patient and other than the above changes listed there is no difference from the previous exam.       Medications and allergies have been reviewed in detail with the patient.  There are no changes here either.  Please see  the medication reconciliation sheet in the chart for full details of medications and allergies.    PHYSICAL EXAMINATION:    VITAL SIGNS: BP 112/64   Pulse 70   Resp 16    Neck: normal C-spine, no tenderness, FROM without pain, normal neurological exam of arms; normal DTRs, motor, sensory exam  Back: inspection and palpation: paraspinal tenderness noted bilateral lower lumbar left greater then right, antalgic gait and muscle tone and ROM exam: limited range of motion with pain  Lungs: clear to auscultation bilaterally  Heart: regular rate and rhythm, S1, S2 normal, no murmur, click, rub or gallop  Abdomen: soft, non-tender; bowel sounds normal; no masses,  no organomegaly  Extremities: left knee swollen, brace on, tender on lateral aspect, mild edema noted  Pulses:2+ and symmetric   Neurologic: Patient is alert and oriented times three.uses a cane, favors left knee. Reflexes normal and symmetric. Sensation grossly normalspeech normal, mental status intact, cranial nerves 2-12 intact  Motor:Motor exam is symmetrical 5 out of 5 all extremities bilaterally      Patient Active Problem List:     SJPM Medial  meniscus tear, Left knee     SJPM Lateral meniscus tear, Left knee     SJPM Left knee DJD     SJPM Chondromalacia of patella, left     SJPM Sprain and strain of knee and leg       PLAN:       1.  Medications: The patient's opioid agreement was reviewed.The patient shows no evidence of non-compliance. We did provide the patient with a refill of   Orders Placed This Encounter   Medications   ??? HYDROcodone-acetaminophen (NORCO) 7.5-325 MG per tablet     Sig: Take 1 tablet by mouth every 8 hours as needed for Pain for 30 days. DO NOT FILL UNTIL 03-22-13     Dispense:  90 tablet     Refill:  0     ICD-9: 836.0, 836.1, 715.96, 884.9, 717.7     2.   Imaging: none needed today  3.  Physical Therapy: home exercises encouraged   4.  Interventions: no procedures needed at this time  5.  Referrals:none needed at this time  6.   Ongoing Care: We will see the patient in 1 month or sooner if they have any problems before that time. I encouraged the patient to call our office with any questions.Zaida was given a prescription for lab work including liver  random Urine Drug Screen.  She was instructed to go to the outpatient lab immediately after leaving the office.  She was also instructed that if the Urine Drug Screen is not completed today it my be grounds for dismissal from the opioid program. Patient to go to [address] before 4 pm today. Patient states understanding    Gared Gillie McManusACNS-BC

## 2013-03-28 NOTE — Telephone Encounter (Signed)
Patient had uds done on 03/20/2013.  The results are appropriate.

## 2013-04-06 NOTE — Telephone Encounter (Signed)
Pt called to let us know that PCP has started her on mobic  15 mg and wanted office to be aware of this. Will document on med list

## 2013-04-07 LAB — LIPID PANEL
Cholesterol, Total: 209 mg/dL — ABNORMAL HIGH (ref 0–199)
HDL: 39 mg/dL (ref >40)
LDL Calculated: 135 mg/dL — ABNORMAL HIGH (ref 0–99)
Triglycerides: 174 mg/dL — ABNORMAL HIGH (ref 0–149)
VLDL Cholesterol Calculated: 35 mg/dL

## 2013-04-07 LAB — COMPREHENSIVE METABOLIC PANEL
ALT: 18 U/L (ref 0–32)
AST: 19 U/L (ref 0–31)
Albumin: 4.2 g/dL (ref 3.5–5.2)
Alkaline Phosphatase: 64 U/L (ref 35–104)
BUN: 17 mg/dL (ref 8–23)
CO2: 27 mmol/L (ref 22–29)
Calcium: 9.5 mg/dL (ref 8.6–10.2)
Chloride: 99 mmol/L (ref 98–107)
Creatinine: 0.7 mg/dL (ref 0.5–1.0)
GFR African American: >60
GFR Non-African American: >60 mL/min/{1.73_m2} (ref >=60)
Glucose: 86 mg/dL (ref 74–109)
Potassium: 3.5 mmol/L (ref 3.5–5.0)
Sodium: 142 mmol/L (ref 132–146)
Total Bilirubin: 0.7 mg/dL (ref 0.0–1.2)
Total Protein: 6.9 g/dL (ref 6.4–8.3)

## 2013-04-07 LAB — CBC
Hematocrit: 46 % (ref 34.0–48.0)
Hemoglobin: 15.7 g/dL — ABNORMAL HIGH (ref 11.5–15.5)
MCH: 31.9 pg (ref 26.0–35.0)
MCHC: 34 % (ref 32.0–34.5)
MCV: 93.6 fL (ref 80.0–99.9)
MPV: 12.7 fL — ABNORMAL HIGH (ref 7.0–12.0)
Platelets: 137 E9/L (ref 130–450)
RBC: 4.91 E12/L (ref 3.50–5.50)
RDW: 13.6 fL (ref 11.5–15.0)
WBC: 6.4 E9/L (ref 4.5–11.5)

## 2013-04-19 MED ORDER — HYDROCODONE-ACETAMINOPHEN 7.5-325 MG PO TABS
ORAL_TABLET | Freq: Three times a day (TID) | ORAL | Status: DC | PRN
Start: 2013-04-19 — End: 2013-05-01

## 2013-04-19 MED ORDER — IBUPROFEN 600 MG PO TABS
600 MG | ORAL_TABLET | Freq: Three times a day (TID) | ORAL | Status: DC | PRN
Start: 2013-04-19 — End: 2013-04-19

## 2013-04-19 NOTE — Progress Notes (Signed)
04/19/2013    Jean Ball    Last visit to Pain Management?  03-20-13    Any new medical conditions since your last visit?  Right shoulder.  Saw Dr George Ina.     If yes, explain:      Any new medications since your last visit? Meloxicam  (See updated med summary sheet)    Describe your pain:  (Location, type, intensity, duration)  Left knee and thigh @ outer aspect         Pain is a 8/10 on the VAS Scale.    Do you feel your functional level is improved while on opioid medication? yes    When was the last time you took your pain medication?  2AM:  Norco    Comments:     Pt called to let us know that PCP has started her on mobic 15 mg and wanted office to be aware of this. Will document on med list          STATES THAT SHE THOUGHT THE MELOXICAM WAS A MUSCLE RELAXER BECAUSE DR. MELVASI TOLD HER THAT THE SHOULDER PAIN WAS MUSCULAR.  INSTRUCTED TO NOT TAKE HER IBUPROFEN WITH THE MELOXICAM.      BP 122/60   Pulse 80   Temp(Src) 98.4 ??F (36.9 ??C) (Oral)   Resp 18    C. Mechele Collin, RN        Lilya Smitherman    04/19/2013    HISTORY OF PRESENT ILLNESS:  The patient presents today the [name] today, 04/19/2013 for continued evaluation of   Patient Active Problem List   Diagnosis   ??? SJPM Medial meniscus tear, Left knee   ??? SJPM Lateral meniscus tear, Left knee   ??? SJPM Left knee DJD   ??? SJPM Chondromalacia of patella, left   ??? SJPM Sprain and strain of knee and leg   .    Subjective: See above note. The patient states it is aggravated with activity and controlled with the current pain regimen which includes  Norco is prescribed by this office. The patient denies any side effects or complications from the medications. The patient denies any loss of bowel or bladder, new neurological symptoms or interval weakness. Patient states no change in her chronic left knee pain. Patient states that she has been more active outside with the nicer weather. Patient states that she still has to walk with cane for support.  Patient states that her pain medication does allow her to function and stay active.     The patient???s Review of Systems, Family History, Medical History, and Social History were reviewed with the patient and other than the above changes listed there is no difference from the previous exam.       Medications and allergies have been reviewed in detail with the patient.  There are no changes here either.  Please see the medication reconciliation sheet in the chart for full details of medications and allergies.    PHYSICAL EXAMINATION:    VITAL SIGNS: BP 122/60   Pulse 80   Temp(Src) 98.4 ??F (36.9 ??C) (Oral)   Resp 18  Neck: normal C-spine, no tenderness, FROM without pain, normal neurological exam of arms; normal DTRs, motor, sensory exam   Back: inspection and palpation: paraspinal tenderness noted bilateral lower lumbar left greater then right, antalgic gait and muscle tone and ROM exam: limited range of motion with pain   Lungs: clear to auscultation bilaterally   Heart: regular rate and  rhythm, S1, S2 normal, no murmur, click, rub or gallop   Abdomen: soft, non-tender; bowel sounds normal; no masses, no organomegaly   Extremities: left knee swollen, brace on, tender on lateral aspect, mild edema noted   Pulses:2+ and symmetric   Neurologic: Patient is alert and oriented times three.uses a cane, favors left knee. Reflexes normal and symmetric. Sensation grossly normalspeech normal, mental status intact, cranial nerves 2-12 intact   Motor:Motor exam is symmetrical 5 out of 5 all extremities bilaterally           Patient Active Problem List:     SJPM Medial meniscus tear, Left knee     SJPM Lateral meniscus tear, Left knee     SJPM Left knee DJD     SJPM Chondromalacia of patella, left     SJPM Sprain and strain of knee and leg       PLAN:       1.  Medications: The patient's opioid agreement was reviewed.The patient shows no evidence of non-compliance. We did provide the patient with a refill of   Orders Placed This  Encounter   Medications   ??? HYDROcodone-acetaminophen (NORCO) 7.5-325 MG per tablet     Sig: Take 1 tablet by mouth every 8 hours as needed for Pain for 30 days.     Dispense:  90 tablet     Refill:  0     ICD-9: 836.0, 836.1, 715.96, 884.9, 717.7   ??? DISCONTD: ibuprofen (IBU) 600 MG tablet     Sig: Take 1 tablet by mouth every 8 hours as needed for Pain for 120 days. Take with food.  DO NOT FILL UNTIL 04-25-13     Dispense:  90 tablet     Refill:  3     ICD-9: 836.0, 836.1, 715.96, 884.9, 717.7   Patient on mobic from PCP  2.   Imaging: none needed today  3.  Physical Therapy: home exercises encouraged   4.  Interventions: no procedures needed at this time, Patient given a handicap placard per reequest  5.  Referrals:none needed at this time  6.  Ongoing Care: We will see the patient in 1 month or sooner if they have any problems before that time. I encouraged the patient to call our office with any questions.    Aristeo Hankerson McManusACNS-BC

## 2013-05-18 MED ORDER — HYDROCODONE-ACETAMINOPHEN 7.5-325 MG PO TABS
ORAL_TABLET | Freq: Three times a day (TID) | ORAL | Status: DC | PRN
Start: 2013-05-18 — End: 2013-06-01

## 2013-05-18 NOTE — Progress Notes (Signed)
05/18/2013    Jean Ball    Last visit to Pain Management?  04/18/2013    Any new medical conditions since your last visit?  No new medical problems    If yes, explain:  n/a    Any new medications since your last visit? No new medications  (See updated med summary sheet)    Describe your pain:  (Location, type, intensity, duration)  Left knee and thigh    Pain is a 8/10 on the VAS Scale.    Do you feel your functional level is improved while on opioid medication? "Yes"    When was the last time you took your pain medication?  This AM, Norco    Comments:  Per Telephone Call 04/06/2013  Pt called to let us know that PCP has started her on mobic 15 mg and wanted office to be aware of this. Will document on med list    She has now stopped the Mobic    Ambulating with a straight cane wearing a left knee brace, complains of paresthesias outer left knee      BP 130/62   Pulse 87   Temp(Src) 98.1 ??F (36.7 ??C) (Oral)   Resp 16    L.Steinbeck RN        Kathlyne Loud    05/18/2013    HISTORY OF PRESENT ILLNESS:  The patient presents today the [name] today, 05/18/2013 for continued evaluation of   Patient Active Problem List   Diagnosis   ??? SJPM Medial meniscus tear, Left knee   ??? SJPM Lateral meniscus tear, Left knee   ??? SJPM Left knee DJD   ??? SJPM Chondromalacia of patella, left   ??? SJPM Sprain and strain of knee and leg   .    Subjective: See above note. The patient states the pain in the left knee and thigh is aggravated with activity and controlled with the current pain regimen which includes norco and is prescribed by this office. The patient denies any side effects or complications from the medications. The patient denies any loss of bowel or bladder, new neurological symptoms or interval weakness. The patient states the norco allows her to function at home. She was started on mobic by her PCP and stopped it because of the potential side effects. She is wearing the knee brace which gives her  stability when she is up and about.    The patient???s Review of Systems, Family History, Medical History, and Social History were reviewed with the patient and other than the above changes listed there is no difference from the previous exam.       Medications and allergies have been reviewed in detail with the patient.  There are no changes here either.  Please see the medication reconciliation sheet in the chart for full details of medications and allergies.    PHYSICAL EXAMINATION:    VITAL SIGNS: BP 130/62   Pulse 87   Temp(Src) 98.1 ??F (36.7 ??C) (Oral)   Resp 16  Using a cane  Neck: normal C-spine, no tenderness, FROM without pain, normal neurological exam of arms; normal DTRs, motor, sensory exam  Back: inspection and palpation: inspection of back is normal and muscle tone and ROM exam: muscle tone normal without spasm  Lungs: clear to auscultation bilaterally  Heart: regular rate and rhythm, S1, S2 normal, no murmur, click, rub or gallop  Abdomen: soft, non-tender; bowel sounds normal; no masses,  no organomegaly  Extremities: edema left  knee with tenderness in the medial and lateral aspect brace on  Pulses:2+ and symmetric   Neurologic: Patient is alert and oriented times three. positive findings: muscular weakness left lower limbspeech normal, mental status intact, cranial nerves 2-12 intact  Motor:Motor exam is 5 out of 5 all extremities with the exception of left lower limb is weaker  Provacative Maneuvers:    Patient Active Problem List:     SJPM Medial meniscus tear, Left knee     SJPM Lateral meniscus tear, Left knee     SJPM Left knee DJD     SJPM Chondromalacia of patella, left     SJPM Sprain and strain of knee and leg       PLAN:       1.  Medications: The patient's opioid agreement was reviewed.The patient shows no evidence of non-compliance. We did provide the patient with a refill of   Orders Placed This Encounter   Medications   ??? HYDROcodone-acetaminophen (NORCO) 7.5-325 MG per tablet     Sig:  Take 1 tablet by mouth every 8 hours as needed for Pain for 30 days.     Dispense:  90 tablet     Refill:  0     ICD-9: 836.0, 836.1, 715.96, 884.9, 717.7     2.   Imaging: none needed today  3.  Physical Therapy: home exercises encouraged   4.  Interventions: no procedures needed at this time  5.  Referrals:none needed at this time  6.  Ongoing Care: We will see the patient in 1 month or sooner if they have any problems before that time. I encouraged the patient to call our office with any questions.    Monika Salk, RNCNP

## 2013-06-19 MED ORDER — HYDROCODONE-ACETAMINOPHEN 7.5-325 MG PO TABS
ORAL_TABLET | Freq: Three times a day (TID) | ORAL | Status: DC | PRN
Start: 2013-06-19 — End: 2013-07-17

## 2013-06-19 NOTE — Progress Notes (Signed)
06/19/2013    Jean Ball    Last visit to Pain Management?  05/18/2013    Any new medical conditions since your last visit?  No new medical problems    If yes, explain:  n/a    Any new medications since your last visit? No new medications  (See updated med summary sheet)    Describe your pain:  (Location, type, intensity, duration)  Left knee and thigh    Pain is a 8/10 on the VAS Scale.    Do you feel your functional level is improved while on opioid medication? "Yes"    When was the last time you took your pain medication?  This AM at 0400, Norco    Comments:  Ambulating with a straight cane, wearing a left knee brace    BP 138/74   Pulse 64   Temp(Src) 98.2 ??F (36.8 ??C) (Oral)   Resp 20    L.Steinbeck RN        Kadie Balestrieri    06/19/2013    HISTORY OF PRESENT ILLNESS:  The patient presents today the [name] today, 06/19/2013 for continued evaluation of   Patient Active Problem List   Diagnosis   ??? SJPM Medial meniscus tear, Left knee   ??? SJPM Lateral meniscus tear, Left knee   ??? SJPM Left knee DJD   ??? SJPM Chondromalacia of patella, left   ??? SJPM Sprain and strain of knee and leg   .    Subjective: See above note. The patient states the pain in the left knee and thigh is aggravated with activity and controlled with the current pain regimen which includes norco and is prescribed by this office. The patient denies any side effects or complications from the medications. The patient denies any loss of bowel or bladder, new neurological symptoms or interval weakness. The patient states the norco continues to allow her to function at home. She is having more numbness in the lateral aspect of the left thigh. She wears the brace on the left knee when she is up.    The patient???s Review of Systems, Family History, Medical History, and Social History were reviewed with the patient and other than the above changes listed there is no difference from the previous exam.       Medications and allergies  have been reviewed in detail with the patient.  There are no changes here either.  Please see the medication reconciliation sheet in the chart for full details of medications and allergies.    PHYSICAL EXAMINATION:    VITAL SIGNS: BP 138/74   Pulse 64   Temp(Src) 98.2 ??F (36.8 ??C) (Oral)   Resp 20    Neck: normal C-spine, no tenderness, FROM without pain, normal neurological exam of arms; normal DTRs, motor, sensory exam  Back: full range of motion without pain, no tenderness, no spasm, no curvature  Lungs: clear to auscultation bilaterally  Heart: regular rate and rhythm, S1, S2 normal, no murmur, click, rub or gallop  Abdomen: soft, non-tender; bowel sounds normal; no masses,  no organomegaly  Extremities: edema mild in the left lower limb, brace to the left knee with tenderness to palpation in the medial and lateral aspect  Pulses:2+ and symmetric   Neurologic: Patient is alert and oriented times three. positive findings: abnormal muscle tone left lower limb , muscular weakness left lower limb, sensory deficit left lateral thigh and knee areaspeech normal, mental status intact, cranial nerves 2-12 intact  Motor:Motor  exam is 5 out of 5 all extremities with the exception of left lower limb is weaker  Provacative Maneuvers:    Patient Active Problem List:     SJPM Medial meniscus tear, Left knee     SJPM Lateral meniscus tear, Left knee     SJPM Left knee DJD     SJPM Chondromalacia of patella, left     SJPM Sprain and strain of knee and leg       PLAN:       1.  Medications: The patient's opioid agreement was reviewed.The patient shows no evidence of non-compliance. We did provide the patient with a refill of   Orders Placed This Encounter   Medications   ??? HYDROcodone-acetaminophen (NORCO) 7.5-325 MG per tablet     Sig: Take 1 tablet by mouth every 8 hours as needed for Pain for up to 30 days.     Dispense:  90 tablet     Refill:  0     ICD-9: 836.0, 836.1, 715.96, 884.9, 717.7     2.   Imaging: none needed  today  3.  Physical Therapy: home exercises encouraged   4.  Interventions: no procedures needed at this time  5.  Referrals:none needed at this time  6.  Ongoing Care: We will see the patient in 1 month or sooner if they have any problems before that time. I encouraged the patient to call our office with any questions.    Monika Salk, RNCNP

## 2013-07-18 MED ORDER — HYDROCODONE-ACETAMINOPHEN 7.5-325 MG PO TABS
ORAL_TABLET | Freq: Three times a day (TID) | ORAL | Status: DC | PRN
Start: 2013-07-18 — End: 2013-08-15

## 2013-07-18 MED ORDER — IBUPROFEN 600 MG PO TABS
600 MG | ORAL_TABLET | Freq: Three times a day (TID) | ORAL | Status: DC | PRN
Start: 2013-07-18 — End: 2013-10-27

## 2013-07-18 NOTE — Progress Notes (Signed)
07/18/2013    Jean Ball    Last visit to Pain Management?  06/19/13    Any new medical conditions since your last visit?  no    If yes, explain:  no    Any new medications since your last visit? no  (See updated med summary sheet)    Describe your pain:  (Location, type, intensity, duration)  States" jabbing, dull, a " hot poker" like pain to left knee that radiates to lateral area of left thigh to left hip; pain is an intermittent pain with meds.  Also, c/o of numbness to lateral left knee area that's constant.    Pain is a 8/10 on the VAS Scale.    Do you feel your functional level is improved while on opioid medication? yes    When was the last time you took your pain medication?  norco last taken 4:30am today    Comments: ambulating to exam room with a cane and left knee brace.    BP 104/60   Pulse 72   Temp(Src) 97.7 ??F (36.5 ??C) (Oral)   Resp 16            HISTORY OF PRESENT ILLNESS:  The patient presents at the [facility] today, 07/18/2013, for continued evaluation of:  Patient Active Problem List   Diagnosis   ??? SJPM Medial meniscus tear, Left knee   ??? SJPM Lateral meniscus tear, Left knee   ??? SJPM Left knee DJD   ??? SJPM Chondromalacia of patella, left   ??? SJPM Sprain and strain of knee and leg       SUBJECTIVE: The patient complains of the above.  The patient denies any side effects or complications from the medications. The patient denies any loss of bowel or bladder, new neurological symptoms or interval weakness.  No changes to pain levels. She requests to go back on the ibuprofen as the other med that her PCP was giving her was stopped.  REVIEW OF SYSTEMS: Family History, Medical History, and Social History were reviewed with the patient and other than the above changes listed there is no difference from the previous exam.     Medications and allergies have been reviewed in detail with the patient.  There are no changes here either.  Please see the medication reconciliation sheet  in the chart for full details of medications and allergies.    Current outpatient prescriptions:HYDROcodone-acetaminophen (NORCO) 7.5-325 MG per tablet, Take 1 tablet by mouth every 8 hours as needed for Pain for up to 30 days., Disp: 90 tablet, Rfl: 0, ;  ibuprofen (IBU) 600 MG tablet, Take 1 tablet by mouth every 8 hours as needed for Pain for up to 120 days. Take with food., Disp: 90 tablet, Rfl: 3, ;  Psyllium-Calcium (METAMUCIL PLUS CALCIUM PO), Take  by mouth., Disp: , Rfl: ,   losartan-hydrochlorothiazide (HYZAAR) 50-12.5 MG per tablet, Take 1 tablet by mouth daily., Disp: , Rfl: , ;  albuterol (PROVENTIL HFA) 108 (90 BASE) MCG/ACT inhaler, Inhale 2 puffs into the lungs every 6 hours as needed for Wheezing for 7 days., Disp: 1 Inhaler, Rfl: 0, ;  OMEGA-3 KRILL OIL 300 MG CAPS, Take 1 tablet by mouth daily.  , Disp: , Rfl: ,   Diclofenac Sodium (PENNSAID) 1.5 % SOLN, Place 40 drops onto the skin 2 times daily as needed for 30 days., Disp: 3 Bottle, Rfl: 3, ;  aspirin 325 MG tablet, Take 325 mg by mouth daily.  ,  Disp: , Rfl: , ;  Multiple Vitamins-Minerals (ONE-A-DAY WOMENS 50 PLUS) TABS, Take 1 tablet by mouth daily.  , Disp: , Rfl: , ;  Flaxseed, Linseed, (FLAX SEED OIL) 1000 MG CAPS, Take 1 tablet by mouth daily.  , Disp: , Rfl: ,     PHYSICAL EXAMINATION:    VITAL SIGNS: BP 104/60   Pulse 72   Temp(Src) 97.7 ??F (36.5 ??C) (Oral)   Resp 16    General Appearance:  alert and oriented to person, place and time, well-developed and well-nourished, in no acute distress    Head: normocephalic, atraumatic    Neck: no adenopathy, no carotid bruit, no JVD, supple, symmetrical, trachea midline and thyroid not enlarged, symmetric, no tenderness/mass/nodules, normal C-spine, no tenderness, FROM without pain, normal neurological exam of arms; normal DTRs, motor, sensory exam    Back: inspection and palpation: paraspinal tenderness noted bilateral lumbar, muscle tone and ROM exam: limited range of motion with pain,  antalgic gait kyphosis absent    Lungs: clear to auscultation bilaterally    Heart: regular rate and rhythm, S1, S2 normal, no murmur, click, rub or gallop    Abdomen: soft, non-tender; bowel sounds normal; no masses,  no organomegaly    Extremities: Homans sign is negative, no sign of DVT and brace on left knee  pain, tenderness to touch, limited range of motion    Pulses: Peripheral vascular pulses were palpable bilaterally.     Neurologic: Patient is alert and oriented times three. Cranial nerves 2-12 are grossly intact.  reflexes normal and symmetric, speech normal, sensation to light touch intact and muscle weakness present- left LE    Psychiatric:  cooperative    Motor: Muscle strength is 5/5 bilaterally in the upper and lower extremities. Sensation is intact to light touch throughout in the upper and lower extremities.     Palpation:    Cervical paraspinal musculature bilateral is not tender to palpation and without radiation..     Thoracic paraspinal musculature bilateral is not tender to palpation.     Lumbar paraspinal musculature bilateral is  tender to palpation and radiates down the posterior right and left leg.    PROVOCATIVE MANEUVERS:   Facet Load:   Pain noted with facet loading of the lumbar spine to both sides    Sacroiliac joints bilateral are negative for tenderness to palpation. bilateral upper trochanterics are negative for tenderness upon palpation.     Patient Active Problem List   Diagnosis   ??? SJPM Medial meniscus tear, Left knee   ??? SJPM Lateral meniscus tear, Left knee   ??? SJPM Left knee DJD   ??? SJPM Chondromalacia of patella, left   ??? SJPM Sprain and strain of knee and leg        PLAN:                   1. Medications: The patient's opioid agreement was reviewed. The patient shows               no evidence of non-compliance. We did provide the patient with refills of                              Orders Placed This Encounter   Medications   ??? HYDROcodone-acetaminophen (NORCO) 7.5-325 MG  per tablet     Sig: Take 1 tablet by mouth every 8 hours as needed for Pain for up to 30 days.  Dispense:  90 tablet     Refill:  0     ICD-9: 836.0, 836.1, 715.96, 884.9, 717.7   ??? ibuprofen (IBU) 600 MG tablet     Sig: Take 1 tablet by mouth every 8 hours as needed for Pain for up to 120 days. Take with food.     Dispense:  90 tablet     Refill:  3     ICD-9: 836.0, 836.1, 715.96, 884.9, 717.7   .                          2.  Imaging:  N/A unless noted otherwise         3.  Physical Therapy:  N/A unless noted otherwise         4.  Interventions:  N/A unless noted otherwise         5.  Referrals:  N/A unless noted otherwise         6.  Ongoing Care:  We will see the patient in the office in 1 month or sooner if the patient has any problems before that time.  I                           encouraged them to call our office with any questions.                                                                   Electronically signed by Nehemiah Settle

## 2013-08-03 NOTE — Progress Notes (Signed)
Patient requested TENS unit supplies per [facility]. C-9 was faxed to [facility] on 07/26/2013. We received a request for additional information on 07/28/2013 from [facility] asking for documentation that the patient was compliant with TENS usage. [facility] stated last authorization for TENS supplies was in 2013 and was never used. I spoke with patient and she stated that she had not actually had to use her TENS supplies for a while and had some left over. Her pain began to increase over the last couple of months and she started to use the TENS unit again. She reports currently using it for six to eight hours daily. She reports it has been helping. Reports the pain level is about a 9 before use and the level decreases to a 5 while using the TENS unit.

## 2013-08-15 MED ORDER — HYDROCODONE-ACETAMINOPHEN 7.5-325 MG PO TABS
ORAL_TABLET | Freq: Three times a day (TID) | ORAL | Status: DC | PRN
Start: 2013-08-15 — End: 2013-08-30

## 2013-08-15 NOTE — Progress Notes (Signed)
08/15/2013    Jean Ball    Last visit to Pain Management?  07-18-13    Any new medical conditions since your last visit?  no    If yes, explain:      Any new medications since your last visit? no  (See updated med summary sheet)    Describe your pain:  (Location, type, intensity, duration)  Left hip thigh and knee    Pain is a 7/10 on the VAS Scale.    Do you feel your functional level is improved while on opioid medication? yes    When was the last time you took your pain medication?  norco - THIS AM    Comments:  Uses cane   HAS BRACE ON LEFT KNEE    BP 169/82   Pulse 69   Resp 16    LP LPN

## 2013-08-15 NOTE — Progress Notes (Signed)
Jean Ball    08/15/2013    HISTORY OF PRESENT ILLNESS:  The patient presents today the [name] today, 08/15/2013 for continued evaluation of   Patient Active Problem List   Diagnosis   ??? SJPM Medial meniscus tear, Left knee   ??? SJPM Lateral meniscus tear, Left knee   ??? SJPM Left knee DJD   ??? SJPM Chondromalacia of patella, left   ??? SJPM Sprain and strain of knee and leg   .    Subjective: See above note. The patient states it is aggravated with activity and controlled with the current pain regimen which includes Norco and IBU is  prescribed by this office. The patient denies any side effects or complications from the medications. The patient denies any loss of bowel or bladder, new neurological symptoms or interval weakness. Patient states that she continues with left knee pain. Patient states that the pain and arthritis is progressively getting worse in the knee. Patient states that she has not had any imaging or injections done in the knee for many years. Patient states that she was told to just suffer with the pain after 2 different replacement surgeries. Patient would like see if there are any other options for her pain. Patient states that walking favoring her left knee is causing increased pain in her lumbar spine.  Patient states that her pain medications allow her to function and stay somewhat active.      The patient???s Review of Systems, Family History, Medical History, and Social History were reviewed with the patient and other than the above changes listed there is no difference from the previous exam.       Medications and allergies have been reviewed in detail with the patient.  There are no changes here either.  Please see the medication reconciliation sheet in the chart for full details of medications and allergies.    PHYSICAL EXAMINATION:    VITAL SIGNS: There were no vitals taken for this visit.    Neck: normal C-spine, no tenderness, FROM without pain, normal  neurological exam of arms; normal DTRs, motor, sensory exam   Back: inspection and palpation: paraspinal tenderness noted bilateral lower lumbar left greater then right, antalgic gait and muscle tone and ROM exam: limited range of motion with pain   Lungs: clear to auscultation bilaterally   Heart: regular rate and rhythm, S1, S2 normal, no murmur, click, rub or gallop   Abdomen: soft, non-tender; bowel sounds normal; no masses, no organomegaly   Extremities: left knee swollen, brace on, tender on lateral aspect, mild edema noted   Pulses:2+ and symmetric   Neurologic: Patient is alert and oriented times three.uses a cane, favors left knee. Reflexes normal and symmetric. Sensation grossly normalspeech normal, mental status intact, cranial nerves 2-12 intact   Motor:Motor exam is symmetrical 5 out of 5 all extremities bilaterally       Patient Active Problem List:     SJPM Medial meniscus tear, Left knee     SJPM Lateral meniscus tear, Left knee     SJPM Left knee DJD     SJPM Chondromalacia of patella, left     SJPM Sprain and strain of knee and leg       PLAN:       1.  Medications: The patient's opioid agreement was reviewed. [place]ARRS was reviewed and found appropriate. The patient shows no evidence of non-compliance. We did provide the patient with a refill of   Orders Placed  This Encounter   Medications   ??? HYDROcodone-acetaminophen (NORCO) 7.5-325 MG per tablet     Sig: Take 1 tablet by mouth every 8 hours as needed for Pain for up to 30 days.     Dispense:  90 tablet     Refill:  0     ICD-9: 836.0, 836.1, 715.96, 884.9, 717.7     2.   Imaging: none needed today  3.  Physical Therapy: home exercises encouraged   4.  Interventions: no procedures needed at this time, patient to see Dr. Herbert Moors next visit to discuss options for her left knee pain, maybe future imaging or injections could be considered.   5.  Referrals:none needed at this time  6.  Ongoing Care: We will see the patient in 1 month or sooner if they have  any problems before that time. I encouraged the patient to call our office with any questions.    Rickayla Wieland McManusACNS-BC

## 2013-09-12 MED ORDER — HYDROCODONE-ACETAMINOPHEN 7.5-325 MG PO TABS
ORAL_TABLET | Freq: Three times a day (TID) | ORAL | Status: DC | PRN
Start: 2013-09-12 — End: 2013-09-27

## 2013-09-12 NOTE — Progress Notes (Shared)
09/12/2013    Jean Ball    Last visit to Pain Management?  08-15-13    Any new medical conditions since your last visit?  no    If yes, explain:  no    Any new medications since your last visit? no  (See updated med summary sheet)    Describe your pain:  (Location, type, intensity, duration)  Left hip thigh and knee (hinged brace external left knee)    Pain is a 7/10 on the VAS Scale.    Do you feel your functional level is improved while on opioid medication? yes    When was the last time you took your pain medication?  This morning:  Norco with Ibuprofen    Comments:  Cane to ambulate with hinged knee brace.      BP 159/80   Pulse 71   Temp(Src) 98.1 ??F (36.7 ??C) (Oral)   Resp 18    C. Cashus Halterman,RN

## 2013-09-12 NOTE — Progress Notes (Signed)
09/12/2013    Jean Ball  10/02/1946     [facility] COMPENSATION  CLAIM #[id number]  DOI:  03/03/2001    HISTORY OF PRESENT ILLNESS:  Jean Ball comes to the [facility] today, 09/12/2013 for continued evaluation of the aforementioned chief complaint.      Jean Ball continues to report constant aching pain in the left knee.  The pain is aggravated with activity and alleviated with the current medication regimen. The patient continues to note improved quality of life and level of function with this regimen.  She denies any other associated symptoms.  The patient denies interval weakness, new bowel or bladder complaints or any new neurologic complaints. She notes the pain to be worse with the change of weather.     Jean Ball was last seen on 08/15/2013 at which time she complained of pain in the left knee pain. Patient states that the pain and arthritis is progressively getting worse in the knee. Patient states that she has not had any imaging or injections done in the knee for many years. Patient states that she was told to just suffer with the pain after 2 different replacement surgeries. Patient would like see if there are any other options for Jean Ball pain. Patient states that walking favoring Jean Ball left knee is causing increased pain in Jean Ball lumbar spine.     PERTINENT FAMILY HISTORY:    Jean Ball family history includes Cancer in an unspecified family member.    PAST MEDICAL HISTORY:  Jean Ball  has a past medical history of Hypertension.    PAST SURGICAL HISTORY:  The patient  has past surgical history that includes knee surgery; Total knee arthroplasty (2007); Total knee arthroplasty (2008); back surgery (1990); and Tubal ligation.     MEDICATIONS:  Current outpatient prescriptions:HYDROcodone-acetaminophen (NORCO) 7.5-325 MG per tablet, Take 1 tablet by mouth every 8 hours as needed for Pain for up to 30 days., Disp: 90 tablet, Rfl: 0, ;  ibuprofen (IBU) 600 MG tablet, Take 1 tablet by mouth every 8 hours as  needed for Pain for up to 120 days. Take with food., Disp: 90 tablet, Rfl: 3, ;  Psyllium-Calcium (METAMUCIL PLUS CALCIUM PO), Take  by mouth., Disp: , Rfl: ,   losartan-hydrochlorothiazide (HYZAAR) 50-12.5 MG per tablet, Take 1 tablet by mouth daily., Disp: , Rfl: , ;  albuterol (PROVENTIL HFA) 108 (90 BASE) MCG/ACT inhaler, Inhale 2 puffs into the lungs every 6 hours as needed for Wheezing for 7 days., Disp: 1 Inhaler, Rfl: 0, ;  OMEGA-3 KRILL OIL 300 MG CAPS, Take 1 tablet by mouth daily.  , Disp: , Rfl: ,   Diclofenac Sodium (PENNSAID) 1.5 % SOLN, Place 40 drops onto the skin 2 times daily as needed for 30 days., Disp: 3 Bottle, Rfl: 3, ;  aspirin 325 MG tablet, Take 325 mg by mouth daily.  , Disp: , Rfl: , ;  Multiple Vitamins-Minerals (ONE-A-DAY WOMENS 50 PLUS) TABS, Take 1 tablet by mouth daily.  , Disp: , Rfl: , ;  Flaxseed, Linseed, (FLAX SEED OIL) 1000 MG CAPS, Take 1 tablet by mouth daily.  , Disp: , Rfl: ,     ALLERGIES:  Jean Ball is allergic to cephalexin.    SOCIAL HISTORY:  Jean Ball  reports that she has been smoking Cigarettes.  She has a 30 pack-year smoking history. She does not have any smokeless tobacco history on file. She reports that  drinks alcohol. She reports that she does not use illicit drugs.  REVIEW OF SYSTEMS:   Jean Ball denies fever/chills, chest pain, shortness of breath, new bowel or bladder complaints or suicidal ideations. All other review of systems was negative.    VITAL SIGNS:   Jean Ball  oral temperature is 98.1 ??F (36.7 ??C). Jean Ball blood pressure is 159/80 and Jean Ball pulse is 71. Jean Ball respiration is 18.     PHYSICAL EXAMINATION:    General Descriptors:  A well appearing individual, sitting comfortably in chair on room entry in no acute distress.    PSYCH./MENTAL STATUS:  The patient is awake and alert.  Displays appropriate mood and affect.  Denies suicidal ideation or intent.    HEENT: Head is normocephalic, atraumatic.  Eyes: Extraocular muscles are intact.  Cranial nerves 2-12 grossly  intact.  Hearing is grossly intact.  Oral mucosal membranes are moist.      GAIT/GROSS MOTOR STATIONS:  The patient ambulates slowly with the assistance of a Straight Cane.      EXTREMITIES:  The left knee has surgical scarring.  There is swelling and tenderness to palpation over the patella.  ROM markedly decreased.  She presents with a brace.    LUNGS:  Normal respiratory efforts.  Clear to auscultation bilaterally.      CARDIOVASCULAR:  Regular rate and rhythm.      PERIPHERAL VASCULAR: No edema noted in the bilateral lower extremities.    ABDOMEN:  obese    INTEGUMENT: Appearance of exposed skin is unremarkable.    NEUROLOGICAL:  Sensation is intact to light touch in the upper and lower extremities bilaterally. Hoffmann's is negative bilaterally. Coordination intact.    MUSCULOSKELETAL:  Extremity strength is normal and full in all major muscle groups of the upper and lower extremities.  No focal areas of atrophy or tone abnormalities noted.      PALPATION:   Pain noted with palpation over the medial aspect of the left knee. Pain noted with flexion and extension of the left leg. Paraspinal muscle spasms noted in the lumbar region.    RANGE OF MOTION:    Patient was wearing a knee brace on the left leg. She did not remove this for exam today.    DIAGNOSIS:        Medial meniscus tear, Left knee     Lateral meniscus tear, Left knee     Left knee DJD     Chondromalacia of patella, left     Sprain and strain of knee and leg     PLAN:   I had a lengthy discussion with Jean Ball today regarding possible therapeutic options. I continue to recommend a multidisciplinary approach to Jean Ball chronic pain.   Jean Ball has agreed to proceed as follows:    ?? Physical Therapy: I recommend physical therapy to focus on core strengthening exercises, strength and flexibility exercises and a low velocity home exercise routine.    ?? Counseling:   ?? Psychological support was offered.  ?? Jean Ball was counseled regarding the importance of regular  exercise and a healthy diet in the role of improving pain symptoms.    ?? Medications:   ?? Jean Ball opioid agreement was reviewed. Jean Ball was reviewed as well.  She shows no evidence of non-compliance.  Orders Placed This Encounter   Medications   ??? HYDROcodone-acetaminophen (NORCO) 7.5-325 MG per tablet     Sig: Take 1 tablet by mouth every 8 hours as needed for Pain for up to 30 days.     Dispense:  90 tablet     Refill:  0     ICD-9: 836.0, 836.1, 715.96, 884.9, 717.7     *I did order a urine drug screen today.  Shabreka was given a prescription for lab work including liver enzymes, BUN, Creatinine and a random Urine Drug Screen.  She was instructed to go to the outpatient lab immediately after leaving the office.  It was explained in detail that if the drug screen is not done TODAY, is inappropriate for these medications or any illicit drugs she will not be a candidate for our opioid program or may be grounds for dismissal from our opioid program.  She verbalized understanding.       ?? Imaging/Testing:  None at this time    ?? Interventions:   I did discuss with Farrie that she may benefit from a trial spinal cord stimulator. I did provide Jean Ball with information regarding this today.  She was told to review this information and we would discuss the procedure in detail including risks, benefits, and alternatives during his next scheduled appointment if she decides to proceed.    I encouraged Johnika to call with questions, concerns or worsening of symptoms.  She will be seen again in the office in 1 month.    Gabriel Rung Namrata Dangler, D.O.    Board Certified in Pain Management  Board Certified in Physical Medicine and Rehabilitation     cc:  Dr. Sanda Linger          Dr. Ellery Plunk

## 2013-09-13 LAB — HEPATIC FUNCTION PANEL
ALT: 22 U/L (ref 0–32)
AST: 24 U/L (ref 0–31)
Albumin: 4 g/dL (ref 3.5–5.2)
Alkaline Phosphatase: 76 U/L (ref 35–104)
Bilirubin, Direct: <0.2 mg/dL (ref 0.0–0.3)
Bilirubin, Indirect: 0.1 mg/dL (ref 0.0–1.0)
Total Bilirubin: 0.3 mg/dL (ref 0.0–1.2)
Total Protein: 6.9 g/dL (ref 6.4–8.3)

## 2013-09-13 LAB — CREATININE
Creatinine: 0.8 mg/dL (ref 0.5–1.0)
GFR African American: >60
GFR Non-African American: >60 mL/min/{1.73_m2} (ref >=60)

## 2013-09-13 LAB — BUN: BUN: 20 mg/dL (ref 8–23)

## 2013-09-25 NOTE — Telephone Encounter (Signed)
Patient had uds/labs done on 09/12/2013.  The results are appropriate and the labs within normal limits.

## 2013-10-11 MED ORDER — HYDROCODONE-ACETAMINOPHEN 7.5-325 MG PO TABS
ORAL_TABLET | Freq: Three times a day (TID) | ORAL | Status: DC | PRN
Start: 2013-10-11 — End: 2013-10-27

## 2013-10-11 NOTE — Progress Notes (Signed)
10/11/2013    Jean Ball    Last visit to Pain Management?  09/12/2013    Any new medical conditions since your last visit?  Yes    If yes, explain:  Saw Dr George Ina for Right Shoulder swelling    Any new medications since your last visit? Was on Medrol Dose Pack, therapy completed  (See updated med summary sheet)    Describe your pain:  (Location, type, intensity, duration)  Left hip, thigh and knee    Pain is a 7/10 on the VAS Scale.    Do you feel your functional level is improved while on opioid medication? "Yes"    When was the last time you took your pain medication?  This AM, Norco    Comments:  Ambulating with a straight cane, wearing a left knee brace.  Complains of paresthesias left knee    BP 134/48   Pulse 76   Temp(Src) 98.1 ??F (36.7 ??C) (Oral)   Resp 25    L.Steinbeck RN        [name]    10/11/2013    HISTORY OF PRESENT ILLNESS:  The patient [facility] the [facility] today, 10/11/2013 for continued evaluation of   Patient Active Problem List   Diagnosis   ??? Medial meniscus tear, Left knee   ??? Lateral meniscus tear, Left knee   ??? Left knee DJD   ??? Chondromalacia of patella, left   ??? Sprain and strain of knee and leg   .    Subjective: See above note. The patient states the pain in the left thigh and knee is aggravated with activity and controlled with the current pain regimen which includes norco and is prescribed by this office. The patient denies any side effects or complications from the medications. The patient denies any loss of bowel or bladder, new neurological symptoms or interval weakness. The patient states the norco continues to allow her to function at home. The cold weather aggravates the pain. The brace is helpful for stability in the left knee. The UDS from last month was appropriate.    The patient???s Review of Systems, Family History, Medical History, and Social History were reviewed with the patient and other than the above changes listed there is no  difference from the previous exam.       Medications and allergies have been reviewed in detail with the patient.  There are no changes here either.  Please see the medication reconciliation sheet in the chart for full details of medications and allergies.    PHYSICAL EXAMINATION:    VITAL SIGNS: BP 134/48   Pulse 76   Temp(Src) 98.1 ??F (36.7 ??C) (Oral)   Resp 25    Neck: normal C-spine, no tenderness, FROM without pain, normal neurological exam of arms; normal DTRs, motor, sensory exam  Back: full range of motion without pain, no tenderness, no spasm, no curvature  Lungs: diminished breath sounds posterior - bilateral  Heart: regular rate and rhythm, S1, S2 normal, no murmur, click, rub or gallop  Abdomen: soft, non-tender; bowel sounds normal; no masses,  no organomegaly  Extremities: edema left knee. brace on. tenderness in the left knee  Pulses:2+ and symmetric   Neurologic: Patient is alert and oriented times three. negativespeech normal, mental status intact, cranial nerves 2-12 intact  Motor:Motor exam is 5 out of 5 all extremities with the exception of left lower limb is weaker  Provacative Maneuvers:    Patient Active Problem  List:     Medial meniscus tear, Left knee     Lateral meniscus tear, Left knee     Left knee DJD     Chondromalacia of patella, left     Sprain and strain of knee and leg       PLAN:   OARRS reviewed and is appropriate.      1.  Medications: The patient's opioid agreement was reviewed.The patient shows no evidence of non-compliance. We did provide the patient with a refill of   Orders Placed This Encounter   Medications   ??? HYDROcodone-acetaminophen (NORCO) 7.5-325 MG per tablet     Sig: Take 1 tablet by mouth every 8 hours as needed for Pain for up to 30 days.     Dispense:  90 tablet     Refill:  0     ICD-9: 836.0, 836.1, 715.96, 884.9, 717.7     2.   Imaging: none needed today  3.  Physical Therapy: home exercises encouraged   4.  Interventions: no procedures needed at this  time  5.  Referrals:none needed at this time  6.  Ongoing Care: We will see the patient in 1 month or sooner if they have any problems before that time. I encouraged the patient to call our office with any questions.    Monika Salk, RNCNP

## 2013-11-08 MED ORDER — IBUPROFEN 600 MG PO TABS
600 MG | ORAL_TABLET | Freq: Three times a day (TID) | ORAL | Status: DC | PRN
Start: 2013-11-08 — End: 2014-03-01

## 2013-11-08 MED ORDER — HYDROCODONE-ACETAMINOPHEN 7.5-325 MG PO TABS
ORAL_TABLET | Freq: Three times a day (TID) | ORAL | Status: DC | PRN
Start: 2013-11-08 — End: 2013-11-27

## 2013-11-08 NOTE — Progress Notes (Signed)
11/08/2013    Jean Ball    Last visit to Pain Management?  10/11/13    Any new medical conditions since your last visit?  no    If yes, explain:  na    Any new medications since your last visit? none  (See updated med summary sheet)    Describe your pain:  (Location, type, intensity, duration)  Left Hip,  Thigh,  knee    Pain is a 8/10 on the VAS Scale.    Do you feel your functional level is improved while on opioid medication? yes    When was the last time you took your pain medication?  Norco this am    Comments:  Ambulating with a straight cane, wearing a left knee brace. Complains of paresthesias left knee     BP 122/60   Pulse 80   Temp(Src) 98.9 ??F (37.2 ??C)   Resp 20    R McBride RN        Alvis Pulcini    11/08/2013    HISTORY OF PRESENT ILLNESS:  The patient presents today the [name] today, 11/08/2013 for continued evaluation of   Patient Active Problem List   Diagnosis   ??? Medial meniscus tear, Left knee   ??? Lateral meniscus tear, Left knee   ??? Left knee DJD   ??? Chondromalacia of patella, left   ??? Sprain and strain of knee and leg   .    Subjective: See above note. The patient states the pain in the left hip, thigh and knee is aggravated with activity and controlled with the current pain regimen which includes norco and ibuprofen and is prescribed by this office. The patient denies any side effects or complications from the medications. The patient denies any loss of bowel or bladder, new neurological symptoms or interval weakness. The patient states the meds allow her to function at home. The weather changes have increased the pain in the left leg.    The patient???s Review of Systems, Family History, Medical History, and Social History were reviewed with the patient and other than the above changes listed there is no difference from the previous exam.       Medications and allergies have been reviewed in detail with the patient.  There are no changes here either.  Please see  the medication reconciliation sheet in the chart for full details of medications and allergies.    PAST MEDICAL HISTORY:  Dandrea  has a past medical history of Hypertension.    PHYSICAL EXAMINATION:    VITAL SIGNS: BP 122/60   Pulse 80   Temp(Src) 98.9 ??F (37.2 ??C)   Resp 20    Neck: normal C-spine, no tenderness, FROM without pain, normal neurological exam of arms; normal DTRs, motor, sensory exam  Back: inspection and palpation: paraspinal tenderness noted left hip area and muscle tone and ROM exam: limited range of motion with pain  Lungs: clear to auscultation bilaterally  Heart: regular rate and rhythm, S1, S2 normal, no murmur, click, rub or gallop  Abdomen: soft, non-tender; bowel sounds normal; no masses,  no organomegaly  Extremities: edema mild in lateral left knee with tenderness in this area. wearing a brace to the left knee  Pulses:2+ and symmetric   Neurologic: Patient is alert and oriented times three. positive findings: muscular weakness left lower limb speech normal, mental status intact, cranial nerves 2-12 intact  Motor:Motor exam is 5 out of 5 all extremities with the exception  of left lower limb is weaker  Provacative Maneuvers:    Patient Active Problem List:     Medial meniscus tear, Left knee     Lateral meniscus tear, Left knee     Left knee DJD     Chondromalacia of patella, left     Sprain and strain of knee and leg       PLAN:       1.  Medications: The patient's opioid agreement was reviewed.The patient shows no evidence of non-compliance. We did provide the patient with a refill of   Orders Placed This Encounter   Medications   ??? HYDROcodone-acetaminophen (NORCO) 7.5-325 MG per tablet     Sig: Take 1 tablet by mouth every 8 hours as needed for Pain for up to 30 days.     Dispense:  90 tablet     Refill:  0     ICD-9: 836.0, 836.1, 715.96, 884.9, 717.7   ??? ibuprofen (IBU) 600 MG tablet     Sig: Take 1 tablet by mouth every 8 hours as needed for Pain for up to 120 days. Take with food.  DO  NOT FILL UNTIL 11-13-13     Dispense:  90 tablet     Refill:  3     ICD-9: 836.0, 836.1, 715.96, 884.9, 717.7     2.   Imaging: none needed today  3.  Physical Therapy: home exercises encouraged   4.  Interventions: no procedures needed at this time  5.  Referrals:none needed at this time  6.  Ongoing Care: We will see the patient in 1 month or sooner if they have any problems before that time. I encouraged the patient to call our office with any questions.    Monika Salk, RNCNP

## 2013-12-05 LAB — COMPREHENSIVE METABOLIC PANEL
ALT: 22 U/L (ref 0–32)
AST: 21 U/L (ref 0–31)
Albumin: 4.4 g/dL (ref 3.5–5.2)
Alkaline Phosphatase: 78 U/L (ref 35–104)
BUN: 24 mg/dL — ABNORMAL HIGH (ref 8–23)
CO2: 25 mmol/L (ref 22–29)
Calcium: 9.9 mg/dL (ref 8.6–10.2)
Chloride: 100 mmol/L (ref 98–107)
Creatinine: 0.8 mg/dL (ref 0.5–1.0)
GFR African American: >60
GFR Non-African American: >60 mL/min/{1.73_m2} (ref >=60)
Glucose: 107 mg/dL (ref 74–109)
Potassium: 3.5 mmol/L (ref 3.5–5.0)
Sodium: 146 mmol/L (ref 132–146)
Total Bilirubin: 0.7 mg/dL (ref 0.0–1.2)
Total Protein: 7.4 g/dL (ref 6.4–8.3)

## 2013-12-05 LAB — CBC WITH AUTO DIFFERENTIAL
Basophils %: 1 % (ref 0–2)
Basophils Absolute: 0.04 E9/L (ref 0.00–0.20)
Eosinophils %: 4 % (ref 0–6)
Eosinophils Absolute: 0.3 E9/L (ref 0.05–0.50)
Hematocrit: 49.5 % — ABNORMAL HIGH (ref 34.0–48.0)
Hemoglobin: 16.4 g/dL — ABNORMAL HIGH (ref 11.5–15.5)
Lymphocytes %: 29 % (ref 20–42)
Lymphocytes Absolute: 2.22 E9/L (ref 1.50–4.00)
MCH: 31.4 pg (ref 26.0–35.0)
MCHC: 33.2 % (ref 32.0–34.5)
MCV: 94.6 fL (ref 80.0–99.9)
MPV: 11.6 fL (ref 7.0–12.0)
Monocytes %: 6 % (ref 2–12)
Monocytes Absolute: 0.44 E9/L (ref 0.10–0.95)
Neutrophils %: 60 % (ref 43–80)
Neutrophils Absolute: 4.56 E9/L (ref 1.80–7.30)
Platelets: 131 E9/L (ref 130–450)
RBC: 5.23 E12/L (ref 3.50–5.50)
RDW: 13.9 fL (ref 11.5–15.0)
WBC: 7.6 E9/L (ref 4.5–11.5)

## 2013-12-06 MED ORDER — HYDROCODONE-ACETAMINOPHEN 7.5-325 MG PO TABS
ORAL_TABLET | Freq: Three times a day (TID) | ORAL | Status: DC | PRN
Start: 2013-12-06 — End: 2013-12-06

## 2013-12-13 MED ORDER — HYDROCODONE-ACETAMINOPHEN 7.5-325 MG PO TABS
ORAL_TABLET | Freq: Three times a day (TID) | ORAL | Status: DC | PRN
Start: 2013-12-13 — End: 2013-12-28

## 2013-12-13 NOTE — Progress Notes (Signed)
12/13/2013    Jean KohlerNancy Ball    Last visit to Pain Management?  11/08/13    Any new medical conditions since your last visit?  no    If yes, explain:  na    Any new medications since your last visit? none  (See updated med summary sheet)    Describe your pain:  (Location, type, intensity, duration)  Left Hip Thigh Knee    Pain is a 9/10 on the VAS Scale.    Do you feel your functional level is improved while on opioid medication? yes    When was the last time you took your pain medication?  Norco last night ran out    Comments:      BP 96/52    Pulse 80    Temp(Src) 98.4 ??F (36.9 ??C)    Resp 20     R McBride RN        Jean Kohlerancy [name]    12/13/2013    HISTORY OF PRESENT ILLNESS:  [facility] for continued evaluation of   Patient Active Problem List   Diagnosis   ??? Medial meniscus tear, Left knee   ??? Lateral meniscus tear, Left knee   ??? Left knee DJD   ??? Chondromalacia of patella, left   ??? Sprain and strain of knee and leg   .    Subjective: See above note. The patient states the pain in the left hip, thigh and knee is aggravated with activity and controlled with the current pain regimen which includes norco and is prescribed by this office. The patient denies any side effects or complications from the medications. The patient denies any loss of bowel or bladder, new neurological symptoms or interval weakness. The patient states the meds allow her to function at home. She states her TENS unit is broken and it was very beneficial.    The patient???s Review of Systems, Family History, Medical History, and Social History were reviewed with the patient and other than the above changes listed there is no difference from the previous exam.       Medications and allergies have been reviewed in detail with the patient.  There are no changes here either.  Please see the medication reconciliation sheet in the chart for full details of medications and  allergies.    PAST MEDICAL HISTORY:  Jean Ball  has a past medical history of Hypertension.    PHYSICAL EXAMINATION:    VITAL SIGNS: BP 96/52    Pulse 80    Temp(Src) 98.4 ??F (36.9 ??C)    Resp 20     Neck: normal C-spine, no tenderness, FROM without pain, normal neurological exam of arms; normal DTRs, motor, sensory exam  Back: inspection and palpation: paraspinal tenderness noted left lumbar and hip and muscle tone and ROM exam: limited range of motion with pain  Lungs: clear to auscultation bilaterally  Heart: regular rate and rhythm, S1, S2 normal, no murmur, click, rub or gallop  Abdomen: soft, non-tender; bowel sounds normal; no masses,  no organomegaly  Extremities: extremities normal, atraumatic, no cyanosis or edema  Pulses:2+ and symmetric   Neurologic: Patient is alert and oriented times three. Gait normal. Reflexes normal and symmetric. Sensation grossly normalspeech normal, mental status intact, cranial nerves 2-12 intact  Motor:Motor exam is symmetrical 5 out of 5 all extremities bilaterally  Provacative Maneuvers:    Patient Active Problem List:     Medial meniscus tear, Left  knee     Lateral meniscus tear, Left knee     Left knee DJD     Chondromalacia of patella, left     Sprain and strain of knee and leg       PLAN:       1.  Medications: The patient's opioid agreement was reviewed.The patient shows no evidence of non-compliance. We did provide the patient with a refill of   Orders Placed This Encounter   Medications   ??? HYDROcodone-acetaminophen (NORCO) 7.5-325 MG per tablet     Sig: Take 1 tablet by mouth every 8 hours as needed for Pain for up to 30 days.     Dispense:  90 tablet     Refill:  0     ICD-9: 836.0, 836.1, 715.96, 884.9, 717.7     2.   Imaging: none needed today  3.  Physical Therapy: home exercises encouraged , we will order her a new TENS unit and supplies since it was beneficial in reducing the pain  4.  Interventions: no procedures needed at this time  5.  Referrals:none needed at  this time  6.  Ongoing Care: We will see the patient in 1 month or sooner if they have any problems before that time. I encouraged the patient to call our office with any questions.    Monika Salk, RNCNP

## 2014-01-12 MED ORDER — HYDROCODONE-ACETAMINOPHEN 7.5-325 MG PO TABS
ORAL_TABLET | Freq: Three times a day (TID) | ORAL | Status: DC | PRN
Start: 2014-01-12 — End: 2014-01-31

## 2014-01-12 NOTE — Progress Notes (Signed)
01/12/2014    Jean KohlerNancy Ball    Last visit to Pain Management?  12/13/2013    Any new medical conditions since your last visit?  No new medical problems    If yes, explain:  n/a    Any new medications since your last visit? No new medications  (See updated med summary sheet)    Describe your pain:  (Location, type, intensity, duration)  Left Hip,Thigh and Knee  A constant pain that is either dull or sharp    Pain is a 7/10 on the VAS Scale.    Do you feel your functional level is improved while on opioid medication? "Yes"    When was the last time you took your pain medication?  Last night, Norco    Comments:  Ambulating with a straight cane wearing a left knee brace, complains of paresthesias left outer knee area    BP 152/78    Pulse 76    Temp(Src) 98.7 ??F (37.1 ??C) (Oral)    Resp 20     L.Steinbeck RN        HISTORY OF PRESENT ILLNESS:  Jean [facility]udd[facility], 01/12/2014 for continued evaluation of her chronic pain.    Subjective:  See note above.  Jean Ball complains of left hip, thigh and knee pain.  She states it is aggravated with activity and controlled with the current pain regimen which includes Norco and is prescribed by this office.  She denies any side effects or complications from the medications.  She denies any loss of bowel or bladder, new neurological symptoms or interval weakness.    MEDICATIONS:  Current outpatient prescriptions:HYDROcodone-acetaminophen (NORCO) 7.5-325 MG per tablet, Take 1 tablet by mouth every 8 hours as needed for Pain for up to 30 days., Disp: 90 tablet, Rfl: 0, ;  ibuprofen (IBU) 600 MG tablet, Take 1 tablet by mouth every 8 hours as needed for Pain for up to 120 days. Take with food.  DO NOT FILL UNTIL 11-13-13, Disp: 90 tablet, Rfl: 3, ;  Psyllium-Calcium (METAMUCIL PLUS CALCIUM PO), Take  by mouth., Disp: , Rfl: ,   losartan-hydrochlorothiazide (HYZAAR) 50-12.5 MG per tablet, Take 1 tablet by mouth daily., Disp: , Rfl: , ;  albuterol  (PROVENTIL HFA) 108 (90 BASE) MCG/ACT inhaler, Inhale 2 puffs into the lungs every 6 hours as needed for Wheezing for 7 days., Disp: 1 Inhaler, Rfl: 0, ;  OMEGA-3 KRILL OIL 300 MG CAPS, Take 1 tablet by mouth daily.  , Disp: , Rfl: ,   Diclofenac Sodium (PENNSAID) 1.5 % SOLN, Place 40 drops onto the skin 2 times daily as needed for 30 days., Disp: 3 Bottle, Rfl: 3, ;  aspirin 325 MG tablet, Take 325 mg by mouth daily.  , Disp: , Rfl: , ;  Multiple Vitamins-Minerals (ONE-A-DAY WOMENS 50 PLUS) TABS, Take 1 tablet by mouth daily.  , Disp: , Rfl: , ;  Flaxseed, Linseed, (FLAX SEED OIL) 1000 MG CAPS, Take 1 tablet by mouth daily.  , Disp: , Rfl: ,     ALLERGIES:  Jean Ball is allergic to cephalexin.    SOCIAL HISTORY:  Jean Ball  reports that she has been smoking Cigarettes.  She has a 30 pack-year smoking history. She does not have any smokeless tobacco history on file. She reports that she drinks alcohol. She reports that she does not use illicit drugs.    PHYSICAL EXAMINATION:    Her  oral temperature is 98.7 ??F (  37.1 ??C). Her blood pressure is 152/78 and her pulse is 76. Her respiration is 20.   General Appearance: alert and oriented to person, place and time, well-developed and well-nourished, in no acute distress  Neck: neck supple and non tender without mass, no thyromegaly or thyroid nodules, no cervical lymphadenopathy normal C-spine, no tenderness, FROM without pain, normal neurological exam of arms; normal DTRs, motor, sensory exam   Back:  full range of motion without pain, no tenderness, no spasm, no curvature   Pulmonary/Chest: clear to auscultation bilaterally   Cardiovascular: normal rate, regular rhythm, normal S1 and S2, no murmurs, rubs, clicks or gallops, distal pulses intact, no carotid bruits   Abdomen: soft, non-tender; bowel sounds normal; no masses, no organomegaly  Extremities: extremities normal, atraumatic, no cyanosis or edema, patient wearing knee brace on left knee  Musculoskeletal: Tenderness over  left knee(s), limited ROM secondary to pain, patient wearing knee brace   Neurologic: reflexes normal and symmetric, no cranial nerve deficit, gait, coordination and speech normal   Provocative Maneuvers: Facet Loading, lumbar, negative, bilateral    Motor: Muscle strength is 5/5 bilaterally in the upper and lower extremities. Sensation is intact to light touch throughout in the upper and lower extremities.     Patient Active Problem List:     Medial meniscus tear, Left knee     Lateral meniscus tear, Left knee     Left knee DJD     Chondromalacia of patella, left     Sprain and strain of knee and leg     PLAN:       1.  Medications: The patient's opioid agreement was reviewed.The patient shows no evidence of non-compliance. We did provide the patient with a refill of:  Orders Placed This Encounter   Medications   ??? HYDROcodone-acetaminophen (NORCO) 7.5-325 MG per tablet     Sig: Take 1 tablet by mouth every 8 hours as needed for Pain for up to 30 days.     Dispense:  90 tablet     Refill:  0     ICD-9: 836.0, 836.1, 715.96, 884.9, 717.7     2.   Imaging:   None needed at this time  3.  Physical Therapy:  Home exercise program encouraged  4.  Interventions:  No procedures needed at this time  5.  Referrals:  None at this time  6.  Ongoing Care:  We will see the patient in 1 month or sooner if they have any problems before that time. I encouraged the patient to call our office with any questions.    Clint Bolder, PA

## 2014-02-08 MED ORDER — HYDROCODONE-ACETAMINOPHEN 7.5-325 MG PO TABS
ORAL_TABLET | Freq: Three times a day (TID) | ORAL | Status: DC | PRN
Start: 2014-02-08 — End: 2014-03-01

## 2014-02-08 NOTE — Progress Notes (Signed)
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 Medial meniscus tear, Left knee   ??? Lateral meniscus tear, Left knee   ??? Left knee DJD   ??? Chondromalacia of patella, left   ??? Sprain and strain of knee and leg   .    Subjective: See above note. The patient states the pain in the left hip thigh and knee is aggravated with activity and controlled with the current pain regimen which includes norco and ibuprofen and is prescribed by this office. The patient denies any side effects or complications from the medications. The patient denies any loss of bowel or bladder, new neurological symptoms or interval weakness. The patient states the meds continue to allow her to function at home.     The patient???s Review of Systems, Family History, Medical History, and Social History were reviewed with the patient and other than the above changes listed there is no difference from the previous exam.       Medications and allergies have been reviewed in detail with the patient.  There are no changes here either.  Please see the medication reconciliation sheet in the chart for full details of medications and allergies.    PAST MEDICAL HISTORY:  Jean Ball  has a past medical history of Hypertension.    PHYSICAL EXAMINATION:    VITAL SIGNS: BP 132/74    Temp(Src) 98.2 ??F (36.8 ??C) (Oral)    Resp 20   Using a cane  Neck: normal  C-spine, no tenderness, FROM without pain, normal neurological exam of arms; normal DTRs, motor, sensory exam  Back: inspection and palpation: paraspinal tenderness noted left , sacroiliac tenderness noted left and muscle tone and ROM exam: limited range of motion with pain  Lungs: clear to auscultation bilaterally  Heart: regular rate and rhythm, S1, S2 normal, no murmur, click, rub or gallop  Abdomen: soft, non-tender; bowel sounds normal; no masses,  no organomegaly  Extremities: edema left lower limb, is wearing a brace to the left knee  Pulses:2+ and symmetric   Neurologic: Patient is alert and oriented times three. Gait normal. Reflexes normal and symmetric. Sensation grossly normalspeech normal, mental status intact, cranial nerves 2-12 intact  Motor:Motor exam is 5 out of 5 all extremities with the exception of left leg is 4/5  Provacative Maneuvers:    Patient Active Problem List:     Medial meniscus tear, Left knee     Lateral meniscus tear, Left knee     Left knee DJD     Chondromalacia of patella, left     Sprain and strain of knee and leg       PLAN:  1.  Medications: The patient's opioid agreement was reviewed.The patient shows no evidence of non-compliance. We did provide the patient with a refill of   Orders Placed This Encounter   Medications   ??? HYDROcodone-acetaminophen (NORCO) 7.5-325 MG per tablet     Sig: Take 1 tablet by mouth every 8 hours as needed for Pain for up to 30 days. DO NOT FILL UNTIL 02-09-14     Dispense:  90 tablet     Refill:  0     ICD-9: 836.0, 836.1, 715.96, 884.9, 717.7     2.   Imaging: none needed today  3.  Physical Therapy: home exercises encouraged   4.  Interventions: no procedures needed at this time  5.  Referrals:none needed at this time  6.  Ongoing Care: We will see the patient in 1 month or sooner if they have any problems before that time. I encouraged the patient to call our office with any questions. Jean Ball was given a prescription for a random Urine Drug  Screen.  She was instructed to go to the outpatient lab immediately after leaving the office.  It was explained in detail that if the drug screen is not done TODAY, is inappropriate for these medications or any illicit drugs she will not be a candidate for our opioid program or may be grounds for dismissal from our opioid program.  She verbalized understanding.        [name], RNCNP

## 2014-02-14 NOTE — Telephone Encounter (Signed)
Patient had uds done on 02/08/2014.  The results are appropriate.

## 2014-03-08 MED ORDER — IBUPROFEN 600 MG PO TABS
600 MG | ORAL_TABLET | Freq: Three times a day (TID) | ORAL | Status: DC | PRN
Start: 2014-03-08 — End: 2014-06-06

## 2014-03-08 MED ORDER — HYDROCODONE-ACETAMINOPHEN 7.5-325 MG PO TABS
ORAL_TABLET | Freq: Three times a day (TID) | ORAL | Status: DC | PRN
Start: 2014-03-08 — End: 2014-03-08

## 2014-03-08 MED ORDER — HYDROCODONE-ACETAMINOPHEN 7.5-325 MG PO TABS
ORAL_TABLET | Freq: Three times a day (TID) | ORAL | Status: DC | PRN
Start: 2014-03-08 — End: 2014-03-28

## 2014-03-08 NOTE — Progress Notes (Signed)
03/08/2014    Jean KohlerNancy Ball    Last visit to Pain Management?  02/08/14    Any new medical conditions since your last visit?  No new medical problems    If yes, explain:  NA    Any new medications since your last visit? No new medications  (See updated med summary sheet)    Describe your pain:  (Location, type, intensity, duration)  Left knee and goes up leg into hip. constant dull and sharp. Leg swollen more this week.     Pain is a 8/10 on the VAS Scale.    Do you feel your functional level is improved while on opioid medication? Yes    When was the last time you took your pain medication?  Today 03:00 Norco and Ibuprofen    Comments:  presents with brace on left knee.   Patient had uds done on 02/08/2014. The results are appropriate.            BP 149/82    Pulse 68    Temp(Src) 98.2 ??F (36.8 ??C)    Resp 20     D. Helmick, RN        HISTORY OF PRESENT ILLNESS:  [facility], 03/08/2014 for continued evaluation of her chronic pain.    Subjective:  See note above.  Jean Ball complains of left leg, left knee, and left hip pain.  She states it is aggravated with activity and controlled with the current pain regimen which includes Norco and ibuprofen and is prescribed by this office.  She denies any side effects or complications from the medications.  She denies any loss of bowel or bladder, new neurological symptoms or interval weakness.  Patient stated that she continues to use her TENS unit 2-3 times per day for approximately 30 minutes at a time.  Patient stated that she also uses application of ice for pain relief in addition to medication and TENS unit.    MEDICATIONS:  Current outpatient prescriptions:[START ON 03/09/2014] ibuprofen (IBU) 600 MG tablet, Take 1 tablet by mouth every 8 hours as needed for Pain for up to 120 days. Take with food.  DO NOT FILL UNTIL 03-09-14, Disp: 90 tablet, Rfl: 3, ;  [START ON 03/09/2014] HYDROcodone-acetaminophen (NORCO) 7.5-325 MG  per tablet, Take 1 tablet by mouth every 8 hours as needed for Pain for up to 30 days., Disp: 90 tablet, Rfl: 0,   Psyllium-Calcium (METAMUCIL PLUS CALCIUM PO), Take  by mouth., Disp: , Rfl: , ;  losartan-hydrochlorothiazide (HYZAAR) 50-12.5 MG per tablet, Take 1 tablet by mouth daily., Disp: , Rfl: , ;  albuterol (PROVENTIL HFA) 108 (90 BASE) MCG/ACT inhaler, Inhale 2 puffs into the lungs every 6 hours as needed for Wheezing for 7 days., Disp: 1 Inhaler, Rfl: 0, ;  OMEGA-3 KRILL OIL 300 MG CAPS, Take 1 tablet by mouth daily.  , Disp: , Rfl: ,   Diclofenac Sodium (PENNSAID) 1.5 % SOLN, Place 40 drops onto the skin 2 times daily as needed for 30 days., Disp: 3 Bottle, Rfl: 3, ;  aspirin 325 MG tablet, Take 325 mg by mouth daily.  , Disp: , Rfl: , ;  Multiple Vitamins-Minerals (ONE-A-DAY WOMENS 50 PLUS) TABS, Take 1 tablet by mouth daily.  , Disp: , Rfl: , ;  Flaxseed, Linseed, (FLAX SEED OIL) 1000 MG CAPS, Take 1 tablet by mouth daily.  , Disp: , Rfl: ,     ALLERGIES:  Jean Ball  is allergic to cephalexin.    SOCIAL HISTORY:  Jean Ball  reports that she has been smoking Cigarettes.  She has a 30 pack-year smoking history. She does not have any smokeless tobacco history on file. She reports that she drinks alcohol. She reports that she does not use illicit drugs.    PHYSICAL EXAMINATION:    Her  temperature is 98.2 ??F (36.8 ??C). Her blood pressure is 149/82 and her pulse is 68. Her respiration is 20.   General Appearance: alert and oriented to person, place and time, well-developed and well-nourished, in no acute distress  Neck: neck supple and non tender without mass, no thyromegaly or thyroid nodules, no cervical lymphadenopathy normal C-spine, no tenderness, FROM without pain, normal neurological exam of arms; normal DTRs, motor, sensory exam   Back:  full range of motion without pain, no tenderness, no spasm, no curvature   Pulmonary/Chest:  clear to auscultation bilaterally   Cardiovascular: regular rate and  rhythm  Abdomen:  soft, non-tender; bowel sounds normal; no masses,  no organomegaly  Extremities: extremities normal, atraumatic, no cyanosis or edema  Musculoskeletal: Tenderness over left knee(s), Swelling over left knee(s)   Neurologic:  Motor exam is 5 out of 5 all extremities with the exception of left lower extremity 4/5, Sensory intact, Reflexes are intact and symmetrical bilaterally, cranial nerves II-XII are grossly intact        Patient Active Problem List:     Medial meniscus tear, Left knee     Lateral meniscus tear, Left knee     Left knee DJD     Chondromalacia of patella, left     Sprain and strain of knee and leg     PLAN:       1.  Medications: The patient's opioid agreement was reviewed.The patient shows no evidence of non-compliance. We did provide the patient with a refill of:  Orders Placed This Encounter   Medications   ??? DISCONTD: HYDROcodone-acetaminophen (NORCO) 7.5-325 MG per tablet     Sig: Take 1 tablet by mouth every 8 hours as needed for Pain for up to 30 days. DO NOT FILL UNTIL 03-09-14     Dispense:  90 tablet     Refill:  0     ICD-9: 836.0, 836.1, 715.96, 884.9, 717.7   ??? ibuprofen (IBU) 600 MG tablet     Sig: Take 1 tablet by mouth every 8 hours as needed for Pain for up to 120 days. Take with food.  DO NOT FILL UNTIL 03-09-14     Dispense:  90 tablet     Refill:  3     ICD-9: 836.0, 836.1, 715.96, 884.9, 717.7   ??? HYDROcodone-acetaminophen (NORCO) 7.5-325 MG per tablet     Sig: Take 1 tablet by mouth every 8 hours as needed for Pain for up to 30 days.     Dispense:  90 tablet     Refill:  0     ICD-9: 836.0, 836.1, 715.96, 884.9, 717.7     2.   Imaging:   None needed at this time  3.  Physical Therapy:  Home exercise program encouraged  4.  Interventions:  No procedures needed at this time.  Patient continues to wear her brace to left knee for support and uses her cane for support and stability and continues to use her medication, ice application, and TENS unit to maintain her  functional status.  5.  Referrals:  None at this time  6.  Ongoing  Care:  We will see the patient in 1 month or sooner if they have any problems before that time. I encouraged the patient to call our office with any questions.    Lowell Bouton, NP

## 2014-03-08 NOTE — Progress Notes (Shared)
03/08/2014    Jean Ball    Last visit to Pain Management?  02/08/14    Any new medical conditions since your last visit?  No new medical problems    If yes, explain:  NA    Any new medications since your last visit? No new medications  (See updated med summary sheet)    Describe your pain:  (Location, type, intensity, duration)  Left knee and goes up leg into hip. constant dull and sharp. Leg swollen more this week.     Pain is a 8/10 on the VAS Scale.    Do you feel your functional level is improved while on opioid medication? Yes    When was the last time you took your pain medication?  Today 03:00 Norco and Ibuprofen    Comments:  presents with brace on left knee.   Patient had uds done on 02/08/2014. The results are appropriate.            BP 149/82    Pulse 68    Temp(Src) 98.2 ??F (36.8 ??C)    Resp 20     D. Teri Legacy, RN

## 2014-04-05 MED ORDER — HYDROCODONE-ACETAMINOPHEN 7.5-325 MG PO TABS
ORAL_TABLET | Freq: Three times a day (TID) | ORAL | Status: DC | PRN
Start: 2014-04-05 — End: 2014-04-24

## 2014-04-05 NOTE — Progress Notes (Signed)
04/05/2014    Jean KohlerNancy Ball    Last visit to Pain Management?  03-08-14    Any new medical conditions since your last visit?  no    If yes, explain:  no    Any new medications since your last visit? no  (See updated med summary sheet)    Describe your pain:  (Location, type, intensity, duration)  Left knee and goes up leg into hip. constant dull and sharp. Leg swollen more this week.       Pain is a 8/10 on the VAS Scale.    Do you feel your functional level is improved while on opioid medication? yes    When was the last time you took your pain medication?  This morning:  Norco  With IBuprofen    Comments:  Needs scripts.  Uses left knee brace and straight cane to ambulate    Procedures:  None ordered nor done recently     BP 150/69    Pulse 70    Temp(Src) 98.8 ??F (37.1 ??C) (Oral)    Resp 18     C. Elliott,RN        Jean Kohlerancy Ball    04/05/2014    HISTORY OF PRESENT ILLNESS:  The patient presents today the [facility] today, 04/05/2014 for continued evaluation of   Patient Active Problem List   Diagnosis   ??? Medial meniscus tear, Left knee   ??? Lateral meniscus tear, Left knee   ??? Left knee DJD   ??? Chondromalacia of patella, left   ??? Sprain and strain of knee and leg   .    Subjective: See above note. The patient states the pain in the left knee to hip is aggravated with activity and controlled with the current pain regimen which includes norco and is prescribed by this office. The patient denies any side effects or complications from the medications. The patient denies any loss of bowel or bladder, new neurological symptoms or interval weakness. The patient states the meds allow her to function at home. She continues to wear the brace to the knee for stablity.     The patient???s Review of Systems, Family History, Medical History, and Social History were reviewed with the patient and other than the above changes listed there is no difference from the previous exam.       Medications and allergies have  been reviewed in detail with the patient.  There are no changes here either.  Please see the medication reconciliation sheet in the chart for full details of medications and allergies.    PAST MEDICAL HISTORY:  Jean Ball  has a past medical history of Hypertension.    PHYSICAL EXAMINATION:    VITAL SIGNS: BP 150/69    Pulse 70    Temp(Src) 98.8 ??F (37.1 ??C) (Oral)    Resp 18   Using a cane.  Neck: normal C-spine, no tenderness, FROM without pain, normal neurological exam of arms; normal DTRs, motor, sensory exam  Back: full range of motion without pain, no tenderness, no spasm, no curvature  Lungs: clear to auscultation bilaterally  Heart: regular rate and rhythm, S1, S2 normal, no murmur, click, rub or gallop  Abdomen: soft, non-tender; bowel sounds normal; no masses,  no organomegaly  Extremities: edema left knee with tenderness and she is wearing the brace to the left leg  Pulses:2+ and symmetric   Neurologic: Patient is alert and oriented times three. positive findings: abnormal muscle tone left leg,  muscular weakness left leg, sensory deficit left leg speech normal, mental status intact, cranial nerves 2-12 intact  Motor:Motor exam is 5 out of 5 all extremities with the exception of left leg is weaker  Provacative Maneuvers:    Patient Active Problem List:     Medial meniscus tear, Left knee     Lateral meniscus tear, Left knee     Left knee DJD     Chondromalacia of patella, left     Sprain and strain of knee and leg       PLAN:       1.  Medications: The patient's opioid agreement was reviewed.The patient shows no evidence of non-compliance. We did provide the patient with a refill of   Orders Placed This Encounter   Medications   ??? HYDROcodone-acetaminophen (NORCO) 7.5-325 MG per tablet     Sig: Take 1 tablet by mouth every 8 hours as needed for Pain DO NOT FILL UNTIL 04-06-14     Dispense:  90 tablet     Refill:  0     ICD-9: 836.0, 836.1, 715.96, 884.9, 717.7     2.   Imaging: none needed today  3.  Physical  Therapy: home exercises encouraged   4.  Interventions: no procedures needed at this time  5.  Referrals:none needed at this time  6.  Ongoing Care: We will see the patient in 1 month or sooner if they have any problems before that time. I encouraged the patient to call our office with any questions.    [name], RNCNP

## 2014-05-07 MED ORDER — HYDROCODONE-ACETAMINOPHEN 7.5-325 MG PO TABS
ORAL_TABLET | Freq: Three times a day (TID) | ORAL | Status: DC | PRN
Start: 2014-05-07 — End: 2014-06-06
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
## 2014-06-06 MED ORDER — HYDROCODONE-ACETAMINOPHEN 7.5-325 MG PO TABS
ORAL_TABLET | Freq: Three times a day (TID) | ORAL | Status: DC | PRN
Start: 2014-06-06 — End: 2014-06-20

## 2014-06-06 MED ORDER — IBUPROFEN 600 MG PO TABS
600 MG | ORAL_TABLET | Freq: Three times a day (TID) | ORAL | Status: DC | PRN
Start: 2014-06-06 — End: 2014-09-26

## 2014-06-06 NOTE — Progress Notes (Signed)
06/06/2014    Jean Ball    Last visit to Pain Management?  05/07/2014    Any new medical conditions since your last visit?  No new medical problems    If yes, explain:  n/a    Any new medications since your last visit? No new medications  (See updated med summary sheet)    Describe your pain:  (Location, type, intensity, duration)  Left knee that goes up left leg into hip that is constant, and intermittently dull and sharp     Pain is a 7/10 on the VAS Scale.    Do you feel your functional level is improved while on opioid medication? "Yes"    When was the last time you took your pain medication?  This AM, Norco and Ibuprofen    Comments:  Ambulating with a straight cane wearing left knee brace, complains of paresthesias outer left knee    BP 137/64    Pulse 70    Temp(Src) 98.2 ??F (36.8 ??C) (Oral)    Resp 16     L.Steinbeck RN        Jean Ball    06/06/2014    HISTORY OF PRESENT ILLNESS:  The patient [facility] today, 06/06/2014 for continued evaluation of   Patient Active Problem List   Diagnosis   ??? Medial meniscus tear, Left knee   ??? Lateral meniscus tear, Left knee   ??? Left knee DJD   ??? Chondromalacia of patella, left   ??? Sprain and strain of knee and leg   .    Subjective: See above note. The patient states the pain in the left hip to knee is aggravated with activity and controlled with the current pain regimen which includes percocet and ibuprofen and is prescribed by this office. The patient denies any side effects or complications from the medications. The patient denies any loss of bowel or bladder, new neurological symptoms or interval weakness. The patient states the meds continue to allow her to function better at home. I did discuss the risks of taking the ibuprofen with the patient including increased cardiac risks and renal and GI risks.    The patient???s Review of Systems, Family History, Medical History, and Social History were reviewed with the patient  and other than the above changes listed there is no difference from the previous exam.       Medications and allergies have been reviewed in detail with the patient.  There are no changes here either.  Please see the medication reconciliation sheet in the chart for full details of medications and allergies.    PAST MEDICAL HISTORY:  Jean Ball  has a past medical history of Hypertension.    PHYSICAL EXAMINATION:    VITAL SIGNS: BP 137/64    Pulse 70    Temp(Src) 98.2 ??F (36.8 ??C) (Oral)    Resp 16   Using a cane  Neck: normal C-spine, no tenderness, FROM without pain, normal neurological exam of arms; normal DTRs, motor, sensory exam  Back: inspection and palpation: paraspinal tenderness noted left lumbar and hip and muscle tone and ROM exam: limited range of motion with pain, antalgic gait  Lungs: clear to auscultation bilaterally  Heart: regular rate and rhythm, S1, S2 normal, no murmur, click, rub or gallop  Abdomen: soft, non-tender; bowel sounds normal; no masses,  no organomegaly  Extremities: extremities normal, atraumatic, no cyanosis or edema, wearing a brace to the left knee  Pulses:2+ and  symmetric   Neurologic: Patient is alert and oriented times three. positive findings: abnormal muscle tone left lower limb, muscular weakness left lower limb speech normal, mental status intact, cranial nerves 2-12 intact  Motor:Motor exam is 5 out of 5 all extremities with the exception of left lower limb is weaker  Provacative Maneuvers:    Patient Active Problem List:     Medial meniscus tear, Left knee     Lateral meniscus tear, Left knee     Left knee DJD     Chondromalacia of patella, left     Sprain and strain of knee and leg       PLAN:       1.  Medications: The patient's opioid agreement was reviewed.The patient shows no evidence of non-compliance. We did provide the patient with a refill of   Orders Placed This Encounter   Medications   ??? HYDROcodone-acetaminophen (NORCO) 7.5-325 MG per tablet     Sig: Take 1  tablet by mouth every 8 hours as needed for Pain     Dispense:  90 tablet     Refill:  0     ICD-9: 836.0, 836.1, 715.96, 884.9, 717.7   ??? ibuprofen (ADVIL;MOTRIN) 600 MG tablet     Sig: Take 1 tablet by mouth every 8 hours as needed for Pain Take with food.     Dispense:  90 tablet     Refill:  3     ICD-9: 836.0, 836.1, 715.96, 884.9, 717.7     2.   Imaging: none needed today  3.  Physical Therapy: home exercises encouraged   4.  Interventions: no procedures needed at this time  5.  Referrals:none needed at this time  6.  Ongoing Care: We will see the patient in 1 month or sooner if they have any problems before that time. I encouraged the patient to call our office with any questions.    Jean HookMary Jillianne Ball, RNCNP

## 2014-07-04 MED ORDER — HYDROCODONE-ACETAMINOPHEN 7.5-325 MG PO TABS
ORAL_TABLET | Freq: Three times a day (TID) | ORAL | Status: DC | PRN
Start: 2014-07-04 — End: 2014-07-17

## 2014-07-04 NOTE — Progress Notes (Signed)
Last seen:06/06/14    Patient states there has been no changes to her health history or medication since last office visit.    Pain location: Left knee that goes up left leg into hip that is constant, and intermittently dull and sharp     Patient is requesting refill of her pain medications which is currently norco and motrin  Last dose of pain medication: 3am    Uses cane  And wears brace to left knee    On the VAS pain scale patient states pain is a 9  at its worst and 6  at its best  Currently a 8    BP 128/78 mmHg   Pulse 70   Temp(Src) 98 ??F (36.7 ??C)   Resp 20       HISTORY OF PRESENT ILLNESS:  Jean Ball presents to [facility] today, 07/04/2014 for continued evaluation of her chronic pain.    Subjective:  See note above.  Jean Ball complains of left knee pain.  She states it is aggravated with activity and controlled with the current pain regimen which includes Norco, and Motrin and is prescribed by this office.  She denies any side effects or complications from the medications.  She denies any loss of bowel or bladder, new neurological symptoms or interval weakness.    MEDICATIONS:  Current outpatient prescriptions:HYDROcodone-acetaminophen (NORCO) 7.5-325 MG per tablet, Take 1 tablet by mouth every 8 hours as needed for Pain, Disp: 90 tablet, Rfl: 0;  ibuprofen (ADVIL;MOTRIN) 600 MG tablet, Take 1 tablet by mouth every 8 hours as needed for Pain Take with food., Disp: 90 tablet, Rfl: 3;  Psyllium-Calcium (METAMUCIL PLUS CALCIUM PO), Take  by mouth., Disp: , Rfl:   losartan-hydrochlorothiazide (HYZAAR) 50-12.5 MG per tablet, Take 1 tablet by mouth daily., Disp: , Rfl: ;  albuterol (PROVENTIL HFA) 108 (90 BASE) MCG/ACT inhaler, Inhale 2 puffs into the lungs every 6 hours as needed for Wheezing for 7 days., Disp: 1 Inhaler, Rfl: 0;  OMEGA-3 KRILL OIL 300 MG CAPS, Take 1 tablet by mouth daily.  , Disp: , Rfl:   Diclofenac Sodium (PENNSAID) 1.5 % SOLN, Place 40 drops onto the skin 2 times  daily as needed for 30 days., Disp: 3 Bottle, Rfl: 3;  aspirin 325 MG tablet, Take 325 mg by mouth daily.  , Disp: , Rfl: ;  Multiple Vitamins-Minerals (ONE-A-DAY WOMENS 50 PLUS) TABS, Take 1 tablet by mouth daily.  , Disp: , Rfl: ;  Flaxseed, Linseed, (FLAX SEED OIL) 1000 MG CAPS, Take 1 tablet by mouth daily.  , Disp: , Rfl:     ALLERGIES:  Latishia is allergic to cephalexin.    SOCIAL HISTORY:  [name]  reports that she has been smoking Cigarettes.  She has a 30 pack-year smoking history. She does not have any smokeless tobacco history on file. She reports that she drinks alcohol. She reports that she does not use illicit drugs.    PHYSICAL EXAMINATION:    Her  temperature is 98 ??F (36.7 ??C). Her blood pressure is 128/78 and her pulse is 70. Her respiration is 20.   General Appearance: alert and oriented to person, place and time, well-developed and well-nourished, in no acute distress  Neck: neck supple and non tender without mass, no thyromegaly or thyroid nodules, no cervical lymphadenopathy normal C-spine, no tenderness, FROM without pain, normal neurological exam of arms; normal DTRs, motor, sensory exam   Back:  full range of motion without pain, no  tenderness, no spasm, no curvature   Pulmonary/Chest: clear to auscultation bilaterally   Cardiovascular: normal rate, regular rhythm, normal S1 and S2, no murmurs, rubs, clicks or gallops, distal pulses intact, no carotid bruits   Abdomen: soft, non-tender; bowel sounds normal; no masses, no organomegaly  Extremities: extremities normal, atraumatic, no cyanosis or edema, patient wearing knee brace on left knee  Musculoskeletal: Tenderness over left knee(s), limited ROM secondary to pain, patient wearing knee brace   Neurologic: reflexes normal and symmetric, no cranial nerve deficit, gait, coordination and speech normal   Provocative Maneuvers: not concordant with chief complaint    Motor: Muscle strength is 5/5 bilaterally in the upper and lower extremities.  Sensation is intact to light touch throughout in the upper and lower extremities.       Patient Active Problem List:     Medial meniscus tear, Left knee     Lateral meniscus tear, Left knee     Left knee DJD     Chondromalacia of patella, left     Sprain and strain of knee and leg     PLAN:       1.  Medications: The patient's opioid agreement was reviewed.The patient shows no evidence of non-compliance. We did provide the patient with a refill of:  Orders Placed This Encounter   Medications   ??? HYDROcodone-acetaminophen (NORCO) 7.5-325 MG per tablet     Sig: Take 1 tablet by mouth every 8 hours as needed for Pain     Dispense:  90 tablet     Refill:  0     ICD-9: 836.0, 836.1, 715.96, 884.9, 717.7     2.   Imaging:   None needed at this time  3.  Physical Therapy:  Home exercise program encouraged  4.  Interventions:  No procedures needed at this time  5.  Referrals:  None at this time  6.  Ongoing Care:  We will see the patient in 1 month or sooner if they have any problems before that time. I encouraged the patient to call our office with any questions.    Clint BolderSteven Sydney Hasten, PA

## 2014-08-01 MED ORDER — HYDROCODONE-ACETAMINOPHEN 7.5-325 MG PO TABS
ORAL_TABLET | Freq: Three times a day (TID) | ORAL | Status: DC | PRN
Start: 2014-08-01 — End: 2014-09-06

## 2014-08-01 NOTE — Progress Notes (Addendum)
08/01/2014    Jean Ball    Last visit to Pain Management?  07-04-14    Any new medical conditions since your last visit?  no    If yes, explain:  no    Any new medications since your last visit? no  (See updated med summary sheet)    Describe your pain:  (Location, type, intensity, duration)  Left knee that goes up left leg into hip that is constant, and intermittently dull and sharp       Pain is a 8/10 on the VAS Scale.    Do you feel your functional level is improved while on opioid medication? yes    When was the last time you took your pain medication?  3AM:  Norco    Procedures:  nothing recently or pending      Comments:  Hinged knee brace and cane to ambulate    BP 161/94 mmHg   Pulse 77   Temp(Src) 98.3 ??F (36.8 ??C) (Oral)   Resp 18    C. Elliott,RN        Jean Ball    08/01/2014    HISTORY OF PRESENT ILLNESS:  The patient presents today the [name] today, 08/01/2014 for continued evaluation of   Patient Active Problem List   Diagnosis   ??? Medial meniscus tear, Left knee   ??? Lateral meniscus tear, Left knee   ??? Left knee DJD   ??? Chondromalacia of patella, left   ??? Sprain and strain of knee and leg   .    Subjective: See above note. The patient states it is aggravated with activity and controlled with the current pain regimen which includes Norco and is prescribed by this office. The patient denies any side effects or complications from the medications. The patient denies any loss of bowel or bladder, new neurological symptoms or interval weakness. Patient states no change in her chronic pain from a month to month interval. Patient states that her pain medications allow her to function and stay active.     The patient???s Review of Systems, Family History, Medical History, and Social History were reviewed with the patient and other than the above changes listed there is no difference from the previous exam.       Medications and allergies have been reviewed in detail with the patient.   There are no changes here either.  Please see the medication reconciliation sheet in the chart for full details of medications and allergies.    PAST MEDICAL HISTORY:  Jean Ball  has a past medical history of Hypertension.    PHYSICAL EXAMINATION:    VITAL SIGNS: BP 161/94 mmHg   Pulse 77   Temp(Src) 98.3 ??F (36.8 ??C) (Oral)   Resp 18    Neck: normal C-spine, no tenderness, FROM without pain, normal neurological exam of arms; normal DTRs, motor, sensory exam   Back: inspection and palpation: paraspinal tenderness noted bilateral lower lumbar left greater then right, antalgic gait and muscle tone and ROM exam: limited range of motion with pain   Lungs: clear to auscultation bilaterally   Heart: regular rate and rhythm, S1, S2 normal, no murmur, click, rub or gallop   Abdomen: soft, non-tender; bowel sounds normal; no masses, no organomegaly   Extremities: left knee swollen, brace on, tender on lateral aspect, mild edema noted   Pulses:2+ and symmetric   Neurologic: Patient is alert and oriented times three.uses a cane, favors left knee. Reflexes normal and symmetric.  Sensation grossly normalspeech normal, mental status intact, cranial nerves 2-12 intact   Motor:Motor exam is symmetrical 5 out of 5 all extremities bilaterally    Patient Active Problem List:     Medial meniscus tear, Left knee     Lateral meniscus tear, Left knee     Left knee DJD     Chondromalacia of patella, left     Sprain and strain of knee and leg       PLAN:       1.  Medications: The patient's opioid agreement was reviewed. OARRS was reviewed and found appropriate. The patient shows no evidence of non-compliance. We did provide the patient with a refill of   Orders Placed This Encounter   Medications   ??? HYDROcodone-acetaminophen (NORCO) 7.5-325 MG per tablet     Sig: Take 1 tablet by mouth every 8 hours as needed for Pain     Dispense:  90 tablet     Refill:  0     ICD-9: 836.0, 836.1, 715.96, 884.9, 717.7     2.   Imaging: none needed today  3.   Physical Therapy: home exercises encouraged   4.  Interventions: no procedures needed at this time  5.  Referrals:none needed at this time  6.  Ongoing Care: We will see the patient in 1 month or sooner if they have any problems before that time. I encouraged the patient to call our office with any questions.Jean Ball was given a prescription for a random Urine Drug Screen.  She was instructed to go to the outpatient lab immediately after leaving the office.  It was explained in detail that the drug screen must be done TODAY.  She verbalized understanding.      Jean Udovich McManusACNS-BC

## 2014-08-09 LAB — OPIATE, QUANTITATIVE, URINE
6AM Urine: <10 ng/mL
Codeine, Urine: <20 ng/mL
Hydrocodone, Urine: 37 ng/mL
Hydromorphone, Urine: <20 ng/mL
Morphine Urine: <20 ng/mL
Norhydrocodone, Urine: <20 ng/mL
Noroxycodone, Urine: <20 ng/mL
Noroxymorphone, Urine: <20 ng/mL
Oxycodone, Urine Confirmation: <20 ng/mL
Oxymorphone, Urine: <20 ng/mL

## 2014-08-09 LAB — MISCELLANEOUS SENDOUT

## 2014-08-10 LAB — MISCELLANEOUS SENDOUT 2

## 2014-08-15 NOTE — Telephone Encounter (Signed)
Patient had uds done on 08/01/2014.  The results are appropriate.

## 2014-08-30 ENCOUNTER — Inpatient Hospital Stay: Attending: Adult Health | Primary: Family Medicine

## 2014-09-06 MED ORDER — HYDROCODONE-ACETAMINOPHEN 7.5-325 MG PO TABS
ORAL_TABLET | Freq: Three times a day (TID) | ORAL | Status: DC | PRN
Start: 2014-09-06 — End: 2014-09-26

## 2014-09-06 NOTE — Progress Notes (Signed)
09/06/2014    Jean KohlerNancy Ball    Last visit to Pain Management?  08/01/14    Any new medical conditions since your last visit?  no    If yes, explain:  na    Any new medications since your last visit? no  (See updated med summary sheet)    Describe your pain:  (Location, type, intensity, duration)  Left knee that goes up left leg into hip that is constant, and intermittently dull and sharp     Pain is a 8/10 on the VAS Scale.    Do you feel your functional level is improved while on opioid medication? yes    When was the last time you took your pain medication?  Norco 5 am    Comments:  Patient had uds done on 08/01/2014. The results are appropriate    BP 142/74 mmHg   Pulse 80   Temp(Src) 98.3 ??F (36.8 ??C)   Resp 20    R McBride RN        Jean Kohlerancy Ball    09/06/2014    HISTORY OF PRESENT ILLNESS:  [facility] for continued evaluation of   Patient Active Problem List   Diagnosis   ??? Medial meniscus tear, Left knee   ??? Lateral meniscus tear, Left knee   ??? Left knee DJD   ??? Chondromalacia of patella, left   ??? Sprain and strain of knee and leg   .    Subjective: See above note. The patient states the pain in the left hip and leg to the knee is aggravated with activity and controlled with the current pain regimen which includes norco and is prescribed by this office. The patient denies any side effects or complications from the medications. The patient denies any loss of bowel or bladder, new neurological symptoms or interval weakness. The patient states the norco continues to allow her to function at home.  UDS from last visit was appropriate.    The patient???s Review of Systems, Family History, Medical History, and Social History were reviewed with the patient and other than the above changes listed there is no difference from the previous exam.       Medications and allergies have been reviewed in detail with the patient.  There are no changes here  either.  Please see the medication reconciliation sheet in the chart for full details of medications and allergies.    PAST MEDICAL HISTORY:  Jean Ball  has a past medical history of Hypertension.    PHYSICAL EXAMINATION:    VITAL SIGNS: BP 142/74 mmHg   Pulse 80   Temp(Src) 98.3 ??F (36.8 ??C)   Resp 20  Using a cane  Neck: normal C-spine, no tenderness, FROM without pain, normal neurological exam of arms; normal DTRs, motor, sensory exam  Back: inspection and palpation: paraspinal tenderness noted left lumbar, sacroiliac tenderness noted left and muscle tone and ROM exam: limited range of motion with pain  Lungs: diminished breath sounds posterior - bilateral  Heart: regular rate and rhythm, S1, S2 normal, no murmur, click, rub or gallop  Abdomen: soft, non-tender; bowel sounds normal; no masses,  no organomegaly  Extremities: extremities normal, atraumatic, no cyanosis or edema  Pulses:2+ and symmetric   Neurologic: Patient is alert and oriented times three. Gait normal. Reflexes normal and symmetric. Sensation grossly normalspeech normal, mental status intact, cranial nerves 2-12 intact  Motor:Motor exam is symmetrical 5 out of 5 all  extremities bilaterally  Provacative Maneuvers:    Patient Active Problem List:     Medial meniscus tear, Left knee     Lateral meniscus tear, Left knee     Left knee DJD     Chondromalacia of patella, left     Sprain and strain of knee and leg       PLAN:   OARRS reviewed and is appropriate      1.  Medications: The patient's opioid agreement was reviewed.The patient shows no evidence of non-compliance. We did provide the patient with a refill of   Orders Placed This Encounter   Medications   ??? HYDROcodone-acetaminophen (NORCO) 7.5-325 MG per tablet     Sig: Take 1 tablet by mouth every 8 hours as needed for Pain     Dispense:  90 tablet     Refill:  0     ICD-9: 836.0, 836.1, 715.96, 884.9, 717.7     2.   Imaging: none needed today  3.  Physical Therapy: home exercises encouraged   4.   Interventions: no procedures needed at this time  5.  Referrals:none needed at this time  6.  Ongoing Care: We will see the patient in 1 month or sooner if they have any problems before that time. I encouraged the patient to call our office with any questions.    Madilyn HookMary Krystine Pabst, RNCNP

## 2014-10-05 ENCOUNTER — Inpatient Hospital Stay: Attending: Medical | Primary: Family Medicine

## 2014-10-05 MED ORDER — HYDROCODONE-ACETAMINOPHEN 7.5-325 MG PO TABS
ORAL_TABLET | Freq: Three times a day (TID) | ORAL | Status: DC | PRN
Start: 2014-10-05 — End: 2014-10-25

## 2014-10-05 MED ORDER — IBUPROFEN 600 MG PO TABS
600 MG | ORAL_TABLET | Freq: Three times a day (TID) | ORAL | Status: AC | PRN
Start: 2014-10-05 — End: 2015-02-02

## 2014-10-05 NOTE — Progress Notes (Signed)
10/05/2014    Jean KohlerNancy Ball    Last visit to Pain Management?  09/06/2014    Any new medical conditions since your last visit?  Yes    If yes, explain:  Has a cold right now    Any new medications since your last visit? No new medications  (See updated med summary sheet)    Describe your pain:  (Location, type, intensity, duration)  Constant, and intermittently dull and sharp pain Left knee that goes up left leg into hip     Pain is a 8/10 on the VAS Scale.    Do you feel your functional level is improved while on opioid medication? "Yes"    When was the last time you took your pain medication?  Last night, Norco and Ibuprofen    Comments:  Ambulating with a straight cane wearing left knee brace, gait slow  Complains of paresthesias left leg    BP 137/70 mmHg   Pulse 86   Temp(Src) 98.8 ??F (37.1 ??C) (Oral)   Resp 20    L.Steinbeck RN        HISTORY OF PRESENT ILLNESS:  Jean Ball presents to [facility] today, 10/05/2014 for continued evaluation of her chronic pain.    Subjective:  See note above.  Jean Ball complains of left knee pain.  She states it is aggravated with activity and controlled with the current pain regimen which includes Motrin and Norco and is prescribed by this office.  She denies any side effects or complications from the medications.  She denies any loss of bowel or bladder, new neurological symptoms or interval weakness.    MEDICATIONS:  Current outpatient prescriptions:ibuprofen (ADVIL;MOTRIN) 600 MG tablet, Take 1 tablet by mouth every 8 hours as needed for Pain Take with food., Disp: 90 tablet, Rfl: 3;  HYDROcodone-acetaminophen (NORCO) 7.5-325 MG per tablet, Take 1 tablet by mouth every 8 hours as needed for Pain, Disp: 90 tablet, Rfl: 0;  Psyllium-Calcium (METAMUCIL PLUS CALCIUM PO), Take  by mouth., Disp: , Rfl:   losartan-hydrochlorothiazide (HYZAAR) 50-12.5 MG per tablet, Take 1 tablet by mouth daily., Disp: , Rfl: ;  albuterol (PROVENTIL HFA) 108 (90 BASE)  MCG/ACT inhaler, Inhale 2 puffs into the lungs every 6 hours as needed for Wheezing for 7 days., Disp: 1 Inhaler, Rfl: 0;  OMEGA-3 KRILL OIL 300 MG CAPS, Take 1 tablet by mouth daily.  , Disp: , Rfl:   Diclofenac Sodium (PENNSAID) 1.5 % SOLN, Place 40 drops onto the skin 2 times daily as needed for 30 days., Disp: 3 Bottle, Rfl: 3;  aspirin 325 MG tablet, Take 325 mg by mouth daily.  , Disp: , Rfl: ;  Multiple Vitamins-Minerals (ONE-A-DAY WOMENS 50 PLUS) TABS, Take 1 tablet by mouth daily.  , Disp: , Rfl: ;  Flaxseed, Linseed, (FLAX SEED OIL) 1000 MG CAPS, Take 1 tablet by mouth daily.  , Disp: , Rfl:     ALLERGIES:  Jean Ball is allergic to cephalexin.    SOCIAL HISTORY:  Jean Ball  reports that she has been smoking Cigarettes.  She has a 30 pack-year smoking history. She does not have any smokeless tobacco history on file. She reports that she drinks alcohol. She reports that she does not use illicit drugs.    PHYSICAL EXAMINATION:    Her  oral temperature is 98.8 ??F (37.1 ??C). Her blood pressure is 137/70 and her pulse is 86. Her respiration is 20.   General Appearance: alert and oriented to  person, place and time, well-developed and well-nourished, in no acute distress  Neck: normal C-spine, no tenderness, FROM without pain, normal neurological exam of arms; normal DTRs, motor, sensory exam   Back:  full range of motion without pain, no tenderness, no spasm, no curvature   Pulmonary/Chest: clear to auscultation bilaterally   Cardiovascular: normal rate, regular rhythm, normal S1 and S2, no murmurs, rubs, clicks or gallops, distal pulses intact, no carotid bruits   Abdomen: soft, non-tender; bowel sounds normal; no masses, no organomegaly  Extremities: extremities normal, atraumatic, no cyanosis or edema, patient wearing knee brace on left knee  Musculoskeletal: Tenderness over left knee(s), limited ROM secondary to pain, patient wearing knee brace   Neurologic: reflexes normal and symmetric, no cranial nerve deficit,  gait, coordination and speech normal   Provocative Maneuvers: not concordant with chief complaint    Motor: Muscle strength is 5/5 bilaterally in the upper and lower extremities. Sensation is intact to light touch throughout in the upper and lower extremities.      Patient Active Problem List:     Medial meniscus tear, Left knee     Lateral meniscus tear, Left knee     Left knee DJD     Chondromalacia of patella, left     Sprain and strain of knee and leg     PLAN:       1.  Medications: The patient's opioid agreement was reviewed.The patient shows no evidence of non-compliance. We did provide the patient with a refill of:  Orders Placed This Encounter   Medications   ??? ibuprofen (ADVIL;MOTRIN) 600 MG tablet     Sig: Take 1 tablet by mouth every 8 hours as needed for Pain Take with food.     Dispense:  90 tablet     Refill:  3     ICD-10: S83.249A, S83.289A, M17.9,IMO0002   ??? HYDROcodone-acetaminophen (NORCO) 7.5-325 MG per tablet     Sig: Take 1 tablet by mouth every 8 hours as needed for Pain     Dispense:  90 tablet     Refill:  0     ICD-10: S83.249A, S83.289A, M17.9,IMO0002     2.  Imaging:   None needed at this time  3.  Physical Therapy:  Home exercise program encouraged  4.  Interventions:  No procedures needed at this time  5.  Referrals:  None at this time  6.  Ongoing Care:  We will see the patient in 1 month or sooner if they have any problems before that time. I encouraged the patient to call our office with any questions.    Clint BolderSteven Antoria Lanza, PA

## 2014-11-05 ENCOUNTER — Inpatient Hospital Stay: Attending: Adult Health | Primary: Family Medicine

## 2014-11-05 MED ORDER — HYDROCODONE-ACETAMINOPHEN 7.5-325 MG PO TABS
ORAL_TABLET | Freq: Three times a day (TID) | ORAL | Status: AC | PRN
Start: 2014-11-05 — End: 2020-07-13

## 2014-11-05 NOTE — Progress Notes (Signed)
11/05/2014    Jean KohlerNancy Ball    Last visit to Pain Management?  10/05/14    Any new medical conditions since your last visit?  no    If yes, explain:  na    Any new medications since your last visit? no  (See updated med summary sheet)    Describe your pain:  (Location, type, intensity, duration)  Constant, and intermittently dull and sharp pain Left knee that goes up left leg into hip     Pain is a 8/10 on the VAS Scale.    Do you feel your functional level is improved while on opioid medication? yes    When was the last time you took your pain medication?  Norco  Ibuprofen both 3:30 am    Comments:      BP 148/84 mmHg   Temp(Src) 98.2 ??F (36.8 ??C)   Resp 20    R McBride RN    HISTORY OF PRESENT ILLNESS:  Jean Ball presents to [facility] today, 11/05/2014 for continued evaluation of her chronic pain.    Subjective:  See note above.  Jean Ball complains of left knee, left leg, into left hip pain.  She states it is aggravated with activity and controlled with the current pain regimen which includes Norco and is prescribed by this office.  She denies any side effects or complications from the medications.  She denies any loss of bowel or bladder, new neurological symptoms or interval weakness.  She stated that she uses walking cane for support and balance.  She stated that she has had injections in past into left knee and left hip with minimal relief.    MEDICATIONS:  Current outpatient prescriptions:HYDROcodone-acetaminophen (NORCO) 7.5-325 MG per tablet, Take 1 tablet by mouth every 8 hours as needed for Pain, Disp: 90 tablet, Rfl: 0;  ibuprofen (ADVIL;MOTRIN) 600 MG tablet, Take 1 tablet by mouth every 8 hours as needed for Pain Take with food., Disp: 90 tablet, Rfl: 3;  Psyllium-Calcium (METAMUCIL PLUS CALCIUM PO), Take  by mouth., Disp: , Rfl:   losartan-hydrochlorothiazide (HYZAAR) 50-12.5 MG per tablet, Take 1 tablet by mouth daily., Disp: , Rfl: ;  albuterol (PROVENTIL HFA) 108 (90  BASE) MCG/ACT inhaler, Inhale 2 puffs into the lungs every 6 hours as needed for Wheezing for 7 days., Disp: 1 Inhaler, Rfl: 0;  OMEGA-3 KRILL OIL 300 MG CAPS, Take 1 tablet by mouth daily.  , Disp: , Rfl:   Diclofenac Sodium (PENNSAID) 1.5 % SOLN, Place 40 drops onto the skin 2 times daily as needed for 30 days., Disp: 3 Bottle, Rfl: 3;  aspirin 325 MG tablet, Take 325 mg by mouth daily.  , Disp: , Rfl: ;  Multiple Vitamins-Minerals (ONE-A-DAY WOMENS 50 PLUS) TABS, Take 1 tablet by mouth daily.  , Disp: , Rfl: ;  Flaxseed, Linseed, (FLAX SEED OIL) 1000 MG CAPS, Take 1 tablet by mouth daily.  , Disp: , Rfl:     ALLERGIES:  Jean Ball is allergic to cephalexin.    SOCIAL HISTORY:  Jean Ball  reports that she has been smoking Cigarettes.  She has a 30 pack-year smoking history. She does not have any smokeless tobacco history on file. She reports that she drinks alcohol. She reports that she does not use illicit drugs.    PHYSICAL EXAMINATION:    Her  temperature is 98.2 ??F (36.8 ??C). Her blood pressure is 148/84. Her respiration is 20.   General Appearance: alert and oriented to person,  place and time, well-developed and well-nourished, in no acute distress  Neck: neck supple and non tender without mass, no thyromegaly or thyroid nodules, no cervical lymphadenopathy normal C-spine, no tenderness, FROM without pain, normal neurological exam of arms; normal DTRs, motor, sensory exam   Back:  inspection and palpation: no tenderness noted, muscle tone and ROM exam: muscle tone normal without spasm, limited range of motion with pain, antalgic gait, straight leg raise: positive  left   Pulmonary/Chest:  clear to auscultation bilaterally   Cardiovascular: regular rate and rhythm  Abdomen:  soft, non-tender; bowel sounds normal; no masses,  no organomegaly  Extremities: extremities normal, atraumatic, no cyanosis or edema  Musculoskeletal: Tenderness over left hip(s), left knee(s)   Neurologic:  Motor exam is 5 out of 5 all  extremities with the exception of left lower extremity, Sensory intact, Reflexes are intact and symmetrical bilaterally, cranial nerves II-XII are grossly intact  Provocative Maneuvers:  Facet Loading, lumbar, positive, Left      Patient Active Problem List:     Medial meniscus tear, Left knee     Lateral meniscus tear, Left knee     Left knee DJD     Chondromalacia of patella, left     Sprain and strain of knee and leg     PLAN:       1.  Medications: The patient's opioid agreement was reviewed.The patient shows no evidence of non-compliance. We did provide the patient with a refill of:  Orders Placed This Encounter   Medications   ??? HYDROcodone-acetaminophen (NORCO) 7.5-325 MG per tablet     Sig: Take 1 tablet by mouth every 8 hours as needed for Pain     Dispense:  90 tablet     Refill:  0     ICD-10: S83.249A, S83.289A, M17.9,IMO0002     2.   Imaging:   None needed at this time  3.  Physical Therapy:  Home exercise program encouraged  4.  Interventions:  No procedures ordered today.  Encouraged use of heat and ice application for additional pain relief.  Discussed PT and manual therapy referrals and patient declined.  5.  Referrals:  None at this time  6.  Ongoing Care:  We will see the patient in 1 month or sooner if they have any problems before that time. I encouraged the patient to call our office with any questions.    Luevenia MaxinJason Dorwin Fitzhenry, NP

## 2014-11-06 ENCOUNTER — Inpatient Hospital Stay: Attending: Registered Nurse | Primary: Family Medicine

## 2014-11-22 LAB — CBC
Hematocrit: 49.1 % — ABNORMAL HIGH (ref 34.0–48.0)
Hemoglobin: 16 g/dL — ABNORMAL HIGH (ref 11.5–15.5)
MCH: 29.7 pg (ref 26.0–35.0)
MCHC: 32.5 % (ref 32.0–34.5)
MCV: 91.5 fL (ref 80.0–99.9)
MPV: 12.3 fL — ABNORMAL HIGH (ref 7.0–12.0)
Platelets: 146 E9/L (ref 130–450)
RBC: 5.37 E12/L (ref 3.50–5.50)
RDW: 14.8 fL (ref 11.5–15.0)
WBC: 7.4 E9/L (ref 4.5–11.5)

## 2014-11-22 LAB — COMPREHENSIVE METABOLIC PANEL
ALT: 22 U/L (ref 0–32)
AST: 26 U/L (ref 0–31)
Albumin: 4.6 g/dL (ref 3.5–5.2)
Alkaline Phosphatase: 99 U/L (ref 35–104)
Anion Gap: 16 mmol/L (ref 7–16)
BUN: 17 mg/dL (ref 8–23)
CO2: 27 mmol/L (ref 22–29)
Calcium: 9.3 mg/dL (ref 8.6–10.2)
Chloride: 99 mmol/L (ref 98–107)
Creatinine: 0.8 mg/dL (ref 0.5–1.0)
GFR African American: >60
GFR Non-African American: >60 mL/min/{1.73_m2} (ref >=60)
Glucose: 94 mg/dL (ref 74–109)
Potassium: 3.5 mmol/L (ref 3.5–5.0)
Sodium: 142 mmol/L (ref 132–146)
Total Bilirubin: 0.4 mg/dL (ref 0.0–1.2)
Total Protein: 7.1 g/dL (ref 6.4–8.3)

## 2015-08-31 LAB — COMPREHENSIVE METABOLIC PANEL
ALT: 26 U/L (ref 0–32)
AST: 22 U/L (ref 0–31)
Albumin: 4.2 g/dL (ref 3.5–5.2)
Alkaline Phosphatase: 104 U/L (ref 35–104)
Anion Gap: 15 mmol/L (ref 7–16)
BUN: 18 mg/dL (ref 8–23)
CO2: 26 mmol/L (ref 22–29)
Calcium: 9.4 mg/dL (ref 8.6–10.2)
Chloride: 97 mmol/L — ABNORMAL LOW (ref 98–107)
Creatinine: 0.8 mg/dL (ref 0.5–1.0)
GFR African American: >60
GFR Non-African American: >60 mL/min/{1.73_m2} (ref >=60)
Glucose: 96 mg/dL (ref 74–109)
Potassium: 4.1 mmol/L (ref 3.5–5.0)
Sodium: 138 mmol/L (ref 132–146)
Total Bilirubin: 0.7 mg/dL (ref 0.0–1.2)
Total Protein: 7.1 g/dL (ref 6.4–8.3)

## 2015-08-31 LAB — LIPID PANEL
Cholesterol, Total: 196 mg/dL (ref 0–199)
HDL: 38 mg/dL (ref >40)
LDL Calculated: 126 mg/dL — ABNORMAL HIGH (ref 0–99)
Triglycerides: 160 mg/dL — ABNORMAL HIGH (ref 0–149)
VLDL Cholesterol Calculated: 32 mg/dL

## 2015-08-31 LAB — SEDIMENTATION RATE: Sed Rate: 5 mm/Hr (ref 0–20)

## 2015-08-31 LAB — RHEUMATOID FACTOR: Rheumatoid Factor: <10 IU/mL (ref 0–13)

## 2015-08-31 LAB — C-REACTIVE PROTEIN: CRP: 0.5 mg/dL — ABNORMAL HIGH (ref 0.0–0.4)

## 2015-09-02 LAB — ANA: ANA: NEGATIVE

## 2016-04-24 LAB — LIPID PANEL
Cholesterol, Total: 210 mg/dL — ABNORMAL HIGH (ref 0–199)
HDL: 43 mg/dL (ref >40)
LDL Calculated: 146 mg/dL — ABNORMAL HIGH (ref 0–99)
Triglycerides: 106 mg/dL (ref 0–149)
VLDL Cholesterol Calculated: 21 mg/dL

## 2016-04-24 LAB — CBC
Hematocrit: 49.5 % — ABNORMAL HIGH (ref 34.0–48.0)
Hemoglobin: 16.2 g/dL — ABNORMAL HIGH (ref 11.5–15.5)
MCH: 30.7 pg (ref 26.0–35.0)
MCHC: 32.7 % (ref 32.0–34.5)
MCV: 93.9 fL (ref 80.0–99.9)
MPV: 13.3 fL — ABNORMAL HIGH (ref 7.0–12.0)
Platelets: 149 E9/L (ref 130–450)
RBC: 5.27 E12/L (ref 3.50–5.50)
RDW: 13.8 fL (ref 11.5–15.0)
WBC: 6.2 E9/L (ref 4.5–11.5)

## 2020-06-28 ENCOUNTER — Inpatient Hospital Stay (HOSPITAL_COMMUNITY)
Admission: EM | Admit: 2020-06-28 | Discharge: 2020-07-03 | DRG: 190 | Disposition: A | Discharge status: Home Health | Payer: Medicare Other | Attending: Internal Medicine | Admitting: Internal Medicine

## 2020-06-28 ENCOUNTER — Emergency Department (HOSPITAL_COMMUNITY): Payer: Medicare Other

## 2020-06-28 ENCOUNTER — Other Ambulatory Visit: Payer: Self-pay

## 2020-06-28 ENCOUNTER — Encounter (HOSPITAL_COMMUNITY): Payer: Self-pay

## 2020-06-28 DIAGNOSIS — J9601 Acute respiratory failure with hypoxia: Secondary | ICD-10-CM

## 2020-06-28 DIAGNOSIS — R54 Age-related physical debility: Secondary | ICD-10-CM | POA: Diagnosis present

## 2020-06-28 DIAGNOSIS — E860 Dehydration: Secondary | ICD-10-CM | POA: Diagnosis present

## 2020-06-28 DIAGNOSIS — J9602 Acute respiratory failure with hypercapnia: Secondary | ICD-10-CM | POA: Diagnosis present

## 2020-06-28 DIAGNOSIS — R64 Cachexia: Secondary | ICD-10-CM | POA: Diagnosis present

## 2020-06-28 DIAGNOSIS — J439 Emphysema, unspecified: Secondary | ICD-10-CM | POA: Diagnosis present

## 2020-06-28 DIAGNOSIS — I878 Other specified disorders of veins: Secondary | ICD-10-CM | POA: Diagnosis present

## 2020-06-28 DIAGNOSIS — Z888 Allergy status to other drugs, medicaments and biological substances status: Secondary | ICD-10-CM

## 2020-06-28 DIAGNOSIS — E872 Acidosis: Secondary | ICD-10-CM | POA: Diagnosis present

## 2020-06-28 DIAGNOSIS — Z66 Do not resuscitate: Secondary | ICD-10-CM | POA: Diagnosis present

## 2020-06-28 DIAGNOSIS — J90 Pleural effusion, not elsewhere classified: Secondary | ICD-10-CM | POA: Diagnosis present

## 2020-06-28 DIAGNOSIS — K5900 Constipation, unspecified: Secondary | ICD-10-CM | POA: Diagnosis present

## 2020-06-28 DIAGNOSIS — I739 Peripheral vascular disease, unspecified: Secondary | ICD-10-CM | POA: Diagnosis present

## 2020-06-28 DIAGNOSIS — J441 Chronic obstructive pulmonary disease with (acute) exacerbation: Secondary | ICD-10-CM | POA: Diagnosis not present

## 2020-06-28 DIAGNOSIS — I8390 Asymptomatic varicose veins of unspecified lower extremity: Secondary | ICD-10-CM | POA: Diagnosis present

## 2020-06-28 DIAGNOSIS — E46 Unspecified protein-calorie malnutrition: Secondary | ICD-10-CM

## 2020-06-28 DIAGNOSIS — J449 Chronic obstructive pulmonary disease, unspecified: Secondary | ICD-10-CM | POA: Diagnosis present

## 2020-06-28 DIAGNOSIS — Z20822 Contact with and (suspected) exposure to covid-19: Secondary | ICD-10-CM | POA: Diagnosis present

## 2020-06-28 DIAGNOSIS — Z681 Body mass index (BMI) 19 or less, adult: Secondary | ICD-10-CM | POA: Diagnosis not present

## 2020-06-28 DIAGNOSIS — E871 Hypo-osmolality and hyponatremia: Secondary | ICD-10-CM

## 2020-06-28 DIAGNOSIS — R0902 Hypoxemia: Secondary | ICD-10-CM

## 2020-06-28 DIAGNOSIS — F40240 Claustrophobia: Secondary | ICD-10-CM | POA: Diagnosis present

## 2020-06-28 DIAGNOSIS — F17210 Nicotine dependence, cigarettes, uncomplicated: Secondary | ICD-10-CM | POA: Diagnosis present

## 2020-06-28 DIAGNOSIS — I872 Venous insufficiency (chronic) (peripheral): Secondary | ICD-10-CM | POA: Diagnosis present

## 2020-06-28 DIAGNOSIS — R0602 Shortness of breath: Secondary | ICD-10-CM | POA: Diagnosis present

## 2020-06-28 HISTORY — DX: Chronic obstructive pulmonary disease, unspecified: J44.9

## 2020-06-28 HISTORY — DX: Cellulitis, unspecified: L03.90

## 2020-06-28 LAB — COMPREHENSIVE METABOLIC PANEL
ALT: 13 U/L (ref 0–44)
AST: 18 U/L (ref 15–41)
Albumin: 2.9 g/dL — ABNORMAL LOW (ref 3.5–5.0)
Alkaline Phosphatase: 109 U/L (ref 38–126)
Anion gap: 11 (ref 5–15)
BUN: 11 mg/dL (ref 8–23)
CO2: 29 mmol/L (ref 22–32)
Calcium: 9.1 mg/dL (ref 8.9–10.3)
Chloride: 91 mmol/L — ABNORMAL LOW (ref 98–111)
Creatinine, Ser: 0.79 mg/dL (ref 0.44–1.00)
GFR calc Af Amer: >60 mL/min (ref >60)
GFR calc non Af Amer: >60 mL/min (ref >60)
Glucose, Bld: 71 mg/dL (ref 70–99)
Potassium: 4.5 mmol/L (ref 3.5–5.1)
Sodium: 131 mmol/L — ABNORMAL LOW (ref 135–145)
Total Bilirubin: 0.9 mg/dL (ref 0.3–1.2)
Total Protein: 6.7 g/dL (ref 6.5–8.1)

## 2020-06-28 LAB — I-STAT ARTERIAL BLOOD GAS, ED
Acid-base deficit: 2 mmol/L (ref 0.0–2.0)
Bicarbonate: 26.1 mmol/L (ref 20.0–28.0)
Calcium, Ion: 1.23 mmol/L (ref 1.15–1.40)
HCT: 43 % (ref 36.0–46.0)
Hemoglobin: 14.6 g/dL (ref 12.0–15.0)
O2 Saturation: 87 %
Potassium: 4.6 mmol/L (ref 3.5–5.1)
Sodium: 128 mmol/L — ABNORMAL LOW (ref 135–145)
TCO2: 28 mmol/L (ref 22–32)
pCO2 arterial: 59 mmHg — ABNORMAL HIGH (ref 32.0–48.0)
pH, Arterial: 7.254 — ABNORMAL LOW (ref 7.350–7.450)
pO2, Arterial: 62 mmHg — ABNORMAL LOW (ref 83.0–108.0)

## 2020-06-28 LAB — CBC WITH DIFFERENTIAL/PLATELET
Abs Immature Granulocytes: 0.14 10*3/uL — ABNORMAL HIGH (ref 0.00–0.07)
Basophils Absolute: 0.1 10*3/uL (ref 0.0–0.1)
Basophils Relative: 0 %
Eosinophils Absolute: 0 10*3/uL (ref 0.0–0.5)
Eosinophils Relative: 0 %
HCT: 43.9 % (ref 36.0–46.0)
Hemoglobin: 13.8 g/dL (ref 12.0–15.0)
Immature Granulocytes: 1 %
Lymphocytes Relative: 7 %
Lymphs Abs: 1.2 10*3/uL (ref 0.7–4.0)
MCH: 30.6 pg (ref 26.0–34.0)
MCHC: 31.4 g/dL (ref 30.0–36.0)
MCV: 97.3 fL (ref 80.0–100.0)
Monocytes Absolute: 1.1 10*3/uL — ABNORMAL HIGH (ref 0.1–1.0)
Monocytes Relative: 7 %
Neutro Abs: 14.3 10*3/uL — ABNORMAL HIGH (ref 1.7–7.7)
Neutrophils Relative %: 85 %
Platelets: 458 10*3/uL — ABNORMAL HIGH (ref 150–400)
RBC: 4.51 MIL/uL (ref 3.87–5.11)
RDW: 13.1 % (ref 11.5–15.5)
WBC: 16.8 10*3/uL — ABNORMAL HIGH (ref 4.0–10.5)
nRBC: 0 % (ref 0.0–0.2)

## 2020-06-28 LAB — RESPIRATORY PANEL BY PCR

## 2020-06-28 LAB — PROCALCITONIN: Procalcitonin: 0.14 ng/mL

## 2020-06-28 LAB — SARS CORONAVIRUS 2 BY RT PCR (HOSPITAL ORDER, PERFORMED IN ~~LOC~~ HOSPITAL LAB): SARS Coronavirus 2: NEGATIVE

## 2020-06-28 LAB — LACTIC ACID, PLASMA: Lactic Acid, Venous: 1.2 mmol/L (ref 0.5–1.9)

## 2020-06-28 LAB — TROPONIN I (HIGH SENSITIVITY)
Troponin I (High Sensitivity): 30 ng/L — ABNORMAL HIGH (ref <18)
Troponin I (High Sensitivity): 31 ng/L — ABNORMAL HIGH (ref <18)

## 2020-06-28 MED ORDER — NICOTINE 21 MG/24HR TD PT24
21.0000 mg | MEDICATED_PATCH | Freq: Every day | TRANSDERMAL | Status: DC
  Administered 2020-06-28 – 2020-07-03 (×6): 21 mg via TRANSDERMAL
  Filled 2020-06-28 (×8): qty 1

## 2020-06-28 MED ORDER — HYDROCODONE-ACETAMINOPHEN 10-325 MG PO TABS
1.0000 | ORAL_TABLET | Freq: Three times a day (TID) | ORAL | Status: DC | PRN
  Administered 2020-06-28: 1 via ORAL
  Filled 2020-06-28: qty 1

## 2020-06-28 MED ORDER — AZITHROMYCIN 500 MG PO TABS
500.0000 mg | ORAL_TABLET | Freq: Every day | ORAL | Status: AC
  Administered 2020-06-29 – 2020-07-02 (×4): 500 mg via ORAL
  Filled 2020-06-28 (×5): qty 1

## 2020-06-28 MED ORDER — IPRATROPIUM-ALBUTEROL 0.5-2.5 (3) MG/3ML IN SOLN
10.0000 mL | Freq: Once | RESPIRATORY_TRACT | Status: AC
  Administered 2020-06-28: 10 mL via RESPIRATORY_TRACT
  Filled 2020-06-28: qty 12

## 2020-06-28 MED ORDER — ONDANSETRON HCL 4 MG/2ML IJ SOLN
4.0000 mg | Freq: Once | INTRAMUSCULAR | Status: AC
  Administered 2020-06-28: 4 mg via INTRAVENOUS
  Filled 2020-06-28: qty 2

## 2020-06-28 MED ORDER — OXYCODONE-ACETAMINOPHEN 5-325 MG PO TABS
1.0000 | ORAL_TABLET | ORAL | Status: DC | PRN
  Filled 2020-06-28: qty 2

## 2020-06-28 MED ORDER — IPRATROPIUM-ALBUTEROL 0.5-2.5 (3) MG/3ML IN SOLN
3.0000 mL | Freq: Four times a day (QID) | RESPIRATORY_TRACT | Status: DC
  Administered 2020-06-28 – 2020-07-01 (×8): 3 mL via RESPIRATORY_TRACT
  Filled 2020-06-28 (×11): qty 3

## 2020-06-28 MED ORDER — ONDANSETRON HCL 4 MG/2ML IJ SOLN
4.0000 mg | Freq: Four times a day (QID) | INTRAMUSCULAR | Status: DC | PRN
  Administered 2020-06-28 – 2020-07-03 (×14): 4 mg via INTRAVENOUS
  Filled 2020-06-28 (×15): qty 2

## 2020-06-28 MED ORDER — ALBUTEROL SULFATE HFA 108 (90 BASE) MCG/ACT IN AERS
8.0000 | INHALATION_SPRAY | Freq: Once | RESPIRATORY_TRACT | Status: AC
  Administered 2020-06-28: 8 via RESPIRATORY_TRACT
  Filled 2020-06-28: qty 6.7

## 2020-06-28 MED ORDER — FENTANYL CITRATE (PF) 100 MCG/2ML IJ SOLN
25.0000 ug | Freq: Once | INTRAMUSCULAR | Status: AC
  Administered 2020-06-28: 25 ug via INTRAVENOUS
  Filled 2020-06-28: qty 2

## 2020-06-28 MED ORDER — ENSURE ENLIVE PO LIQD
237.0000 mL | Freq: Two times a day (BID) | ORAL | Status: DC
  Administered 2020-06-29 – 2020-07-03 (×7): 237 mL via ORAL

## 2020-06-28 MED ORDER — PREDNISONE 20 MG PO TABS
40.0000 mg | ORAL_TABLET | Freq: Every day | ORAL | Status: AC
  Administered 2020-06-29 – 2020-07-03 (×5): 40 mg via ORAL
  Filled 2020-06-28 (×6): qty 2

## 2020-06-28 MED ORDER — SODIUM CHLORIDE 0.9 % IV SOLN
500.0000 mg | INTRAVENOUS | Status: AC
  Administered 2020-06-28: 500 mg via INTRAVENOUS
  Filled 2020-06-28: qty 500

## 2020-06-28 MED ORDER — ENOXAPARIN SODIUM 30 MG/0.3ML ~~LOC~~ SOLN
30.0000 mg | SUBCUTANEOUS | Status: DC
  Administered 2020-06-28 – 2020-07-02 (×5): 30 mg via SUBCUTANEOUS
  Filled 2020-06-28 (×5): qty 0.3

## 2020-06-28 MED ORDER — METHYLPREDNISOLONE SODIUM SUCC 125 MG IJ SOLR
60.0000 mg | Freq: Once | INTRAMUSCULAR | Status: AC
  Administered 2020-06-28: 60 mg via INTRAVENOUS
  Filled 2020-06-28: qty 2

## 2020-06-28 MED ORDER — MOMETASONE FURO-FORMOTEROL FUM 200-5 MCG/ACT IN AERO
1.0000 | INHALATION_SPRAY | Freq: Two times a day (BID) | RESPIRATORY_TRACT | Status: DC
  Administered 2020-06-29 – 2020-07-03 (×8): 1 via RESPIRATORY_TRACT
  Filled 2020-06-28: qty 8.8

## 2020-06-28 MED ORDER — GUAIFENESIN ER 600 MG PO TB12
600.0000 mg | ORAL_TABLET | Freq: Two times a day (BID) | ORAL | Status: DC
  Administered 2020-06-28 – 2020-07-03 (×10): 600 mg via ORAL
  Filled 2020-06-28 (×11): qty 1

## 2020-06-28 MED ORDER — HYDROMORPHONE HCL 1 MG/ML IJ SOLN
1.0000 mg | Freq: Once | INTRAMUSCULAR | Status: AC
  Administered 2020-06-28: 1 mg via INTRAVENOUS
  Filled 2020-06-28: qty 1

## 2020-06-28 NOTE — ED Notes (Signed)
Internal medicine at bedside

## 2020-06-28 NOTE — ED Triage Notes (Signed)
Pt from home via ems; called out for cp x 1 week, sob x 2 days, increased coughing; does not have covid vaccine; lives in [place], visiting family here; in hospital 2 weeks ago, received antibiotics for cellulitis in both ankles; has been declining since bing discharged; endorses weakness, decreased oral intake; hypoxic with FD, 79% RA, placed on 10L high flow O2, 99%; nausea, Zofran given; refused ASA; a and o x 4; hx COPD; end tidal 15-35; 1048m NS given PTA  153/89 RA 79% 99% 10L RR 40 HR 104 CBG 78

## 2020-06-28 NOTE — ED Provider Notes (Signed)
  Face-to-face evaluation   History: She presents for evaluation of weakness, nausea and occasional trouble breathing.  She uses albuterol and Spiriva for COPD symptoms, but states the Spiriva is not helping much.  She is currently visiting [place].  She is a smoker.  Physical exam: Cachectic, elderly female, with increased respiratory rate.  No increased work of breathing.  No dysarthria or aphasia.  She is lucid.  Extremities are without edema.  Medical screening examination/treatment/procedure(s) were conducted as a shared visit with non-physician practitioner(s) and myself.  I personally evaluated the patient during the encounter    Daleen Bo, MD 06/29/20 [phone]

## 2020-06-28 NOTE — ED Provider Notes (Signed)
[facility] EMERGENCY DEPARTMENT Provider Note   CSN: [id number] Arrival date & time: 06/28/20  1131     History Chief Complaint  Patient presents with  . Chest Pain  . Shortness of Breath    Kristen [name] is a 74 y.o. female.  HPI 74 year old female with a history of cellulitis, COPD presents to the ER for 1 week of chest pain, shortness of breath which began 3 days ago, with increased cough.  Patient lives in [place] and is visiting family here.  Patient was admitted to the hospital [facility] 2 weeks ago, was given antibiotics for cellulitis of both ankles.  She states that the infection has cleared up, though she does have chronic ulcers on her ankles.  She states that over the last few days, she has been feeling more short of breath, and weak.  Per triage notes, patient was found to be hypoxic with 79% on room air, placed on 10 L high flow O2 which improved to 99%.  She also endorsed some nausea and was given some Zofran.  She refused aspirin.  She is alert and oriented x4.  She was also given 1000 mL of normal saline.  Patient does endorse also poor p.o. intake and weakness.  No known fevers, though she does endorse some chills.  Past Medical History:  Diagnosis Date  . Cellulitis   . COPD (chronic obstructive pulmonary disease) (HCC)     There are no problems to display for this patient.   Past Surgical History:  Procedure Laterality Date  . VASCULAR SURGERY       OB History   No obstetric history on file.     No family history on file.  Social History   Tobacco Use  . Smoking status: Current Every Day Smoker    Packs/day: 0.50    Types: Cigarettes  . Smokeless tobacco: Never Used  Substance Use Topics  . Alcohol use: Yes    Comment: occasionally  . Drug use: Never    Home Medications Prior to Admission medications   Not on File    Allergies    Gabapentin  Review of Systems   Review of Systems  Constitutional: Positive for  activity change, appetite change and chills. Negative for fever.  HENT: Negative for ear pain and sore throat.   Eyes: Negative for pain and visual disturbance.  Respiratory: Positive for cough and shortness of breath.   Cardiovascular: Positive for chest pain. Negative for palpitations.  Gastrointestinal: Negative for abdominal pain, nausea and vomiting.  Genitourinary: Negative for dysuria and hematuria.  Musculoskeletal: Negative for arthralgias and back pain.  Skin: Positive for wound. Negative for color change and rash.  Neurological: Negative for seizures and syncope.  All other systems reviewed and are negative.   Physical Exam Updated Vital Signs BP (!) 144/76   Pulse 100   Temp 97.8 F (36.6 C) (Oral)   Resp (!) 21   Ht 5' 10" (1.778 m)   Wt 44 kg   SpO2 (!) 89%   BMI 13.92 kg/m   Physical Exam Vitals and nursing note reviewed.  Constitutional:      General: She is not in acute distress.    Appearance: She is well-developed.     Comments: Chronically ill-appearing, cachectic  HENT:     Head: Normocephalic and atraumatic.  Eyes:     Conjunctiva/sclera: Conjunctivae normal.  Cardiovascular:     Rate and Rhythm: Regular rhythm. Tachycardia present.  Pulses:          Radial pulses are 2+ on the right side and 2+ on the left side.     Heart sounds: No murmur heard.   Pulmonary:     Effort: Tachypnea, accessory muscle usage and respiratory distress present.     Breath sounds: Examination of the right-upper field reveals decreased breath sounds. Examination of the left-upper field reveals decreased breath sounds. Decreased breath sounds and wheezing present.  Abdominal:     Palpations: Abdomen is soft.     Tenderness: There is no abdominal tenderness.  Musculoskeletal:        General: Normal range of motion.     Cervical back: Neck supple.     Right lower leg: No tenderness. No edema.     Left lower leg: No tenderness. No edema.     Comments: Bilateral medial  malleolus with visible ulcers.  Mild to moderate surrounding erythema.  No visible pus drainage or evidence of necrosis.No significant edema   Skin:    General: Skin is warm and dry.     Capillary Refill: Capillary refill takes less than 2 seconds.     Coloration: Skin is not cyanotic.  Neurological:     General: No focal deficit present.     Mental Status: She is alert.  Psychiatric:        Mood and Affect: Mood normal.        Behavior: Behavior normal.     ED Results / Procedures / Treatments   Labs (all labs ordered are listed, but only abnormal results are displayed) Labs Reviewed  CBC WITH DIFFERENTIAL/PLATELET - Abnormal; Notable for the following components:      Result Value   WBC 16.8 (*)    Platelets 458 (*)    Neutro Abs 14.3 (*)    Monocytes Absolute 1.1 (*)    Abs Immature Granulocytes 0.14 (*)    All other components within normal limits  COMPREHENSIVE METABOLIC PANEL - Abnormal; Notable for the following components:   Sodium 131 (*)    Chloride 91 (*)    Albumin 2.9 (*)    All other components within normal limits  TROPONIN I (HIGH SENSITIVITY) - Abnormal; Notable for the following components:   Troponin I (High Sensitivity) 30 (*)    All other components within normal limits  TROPONIN I (HIGH SENSITIVITY) - Abnormal; Notable for the following components:   Troponin I (High Sensitivity) 31 (*)    All other components within normal limits  SARS CORONAVIRUS 2 BY RT PCR (HOSPITAL ORDER, [facility])  LACTIC ACID, PLASMA  LACTIC ACID, PLASMA  URINALYSIS, ROUTINE W REFLEX MICROSCOPIC    EKG EKG Interpretation  Date/Time:  Friday June 28 2020 11:52:37 EDT Ventricular Rate:  94 PR Interval:    QRS Duration: 90 QT Interval:  360 QTC Calculation: 451 R Axis:   89 Text Interpretation: Sinus rhythm Borderline right axis deviation No significant change was found Confirmed by Daleen Bo [phone]) on 06/28/2020 1:45:58  PM   Radiology DG Chest Portable 1 View  Result Date: 06/28/2020 CLINICAL DATA:  Shortness of breath. EXAM: PORTABLE CHEST 1 VIEW COMPARISON:  No prior. FINDINGS: Mediastinum hilar structures are unremarkable. Cardiomegaly. No pulmonary venous congestion. COPD. Bilateral interstitial prominence, most prominent in the right lung base noted. Interstitial edema and/or pneumonitis could present in this fashion. Small bilateral pleural effusions. No pneumothorax. IMPRESSION: 1.  Cardiomegaly.  No pulmonary venous congestion. 2. COPD. Bilateral interstitial  prominence, most prominent the right lung base noted. Interstitial edema and/or pneumonitis could present this fashion. Small bilateral pleural effusions. Electronically Signed   By: Marcello Moores  Register   On: 06/28/2020 12:35    Procedures Procedures (including critical care time)  Medications Ordered in ED Medications  albuterol (VENTOLIN HFA) 108 (90 Base) MCG/ACT inhaler 8 puff (8 puffs Inhalation Given 06/28/20 1241)  fentaNYL (SUBLIMAZE) injection 25 mcg (25 mcg Intravenous Given 06/28/20 1241)  methylPREDNISolone sodium succinate (SOLU-MEDROL) 125 mg/2 mL injection 60 mg (60 mg Intravenous Given 06/28/20 1428)  ondansetron (ZOFRAN) injection 4 mg (4 mg Intravenous Given 06/28/20 1428)  HYDROmorphone (DILAUDID) injection 1 mg (1 mg Intravenous Given 06/28/20 1550)  ipratropium-albuterol (DUONEB) 0.5-2.5 (3) MG/3ML nebulizer solution 10 mL (10 mLs Nebulization Given 06/28/20 1629)    ED Course  I have reviewed the triage vital signs and the nursing notes.  Pertinent labs & imaging results that were available during my care of the patient were reviewed by me and considered in my medical decision making (see chart for details).  Clinical Course as of Jun 29 1643  Fri Jun 28, 2020  1627 Carly    [MB]    Clinical Course User Index [MB] Lyndel Safe   MDM Rules/Calculators/A&P                         74 year old female with cough,  shortness of breath, weakness On presentation, the patient is alert and oriented, however is speaking in short sentences, with increased work of breathing, using her accessory muscles.  She is overall cachectic and chronically ill-appearing.  Underweight.  Physical exam with diffuse wheezes throughout and decreased breath sounds.  She also has chronic ulcers to her medial malleolus, with some mild to moderate erythema, no evidence of pus drainage or necrosis.  She is otherwise alert and oriented.  Vital signs with mild hypertension of 144/76, slightly tachypneic with respiratory rate of 21, currently on 3 L of O2 Startex, dropping into the high 70s low 80s without any supplemental oxygen.  Patient does not require any supplemental oxygen at home.  DDX: COPD exacerbation, COVID-19, PNA, PE, pneumothorax, ACS, UTI, osteomyelitis, cellulitis  LABS: Personally reviewed and interpreted her lab work CMP with mild hyponatremia, however no other significant electrolyte abnormalities.  Normal CO2.  Normal anion gap.  Normal renal and liver function. CBC with a leukocytosis of 16.8, normal hemoglobin.  Increased platelets of 458.  Covid negative. Lactic negative. Troponin elevated at 30, repeat at 31.  Appears to be stable.  EKG with normal sinus rhythm.  IMAGING:  Chest x-ray with evidence of COPD.  Questionable interstitial edema or pneumonitis.  TREATMENT: Patient was given albuterol, Zofran, Solu-Medrol and DuoNeb breathing treatment.  Reports mild improvement.  Still wheezing and visibly short of breath.  MDM: Patient presented with worsening shortness of breath.  Chest x-ray without clear evidence of pneumonia.  She was recently hospitalized, so hospital-acquired pneumonia is potential in the differential.  She is not vaccinated, however her COVID-19 test is negative.  No evidence of pneumothorax on CXR. Troponin is elevated, however stable.  Unclear if this is her baseline as medical record review is  limited.  EKG without ST elevations or T wave inversions. Heart score of 4.  PE is still in the differential she is slightly tachycardic and still hypoxic.  However the suspicion for this is low given that she does have a significant COPD history.  She was  recently hospitalized, however receiving Lovenox daily per the patient.  In terms of her ulcers, patient reports that they are at baseline, and have much improved since her hospitalization.  Will defer to inpatient team for further evaluation and treatment.  DISPOSITION: Spoke with Carly from the internal medicine team, patient will be further admitted for COPD exacerbation with increased oxygen requirements. Will need to follow ulcers, likely will need a consult from wound care.   UA still pending, patient cannot provide a urine at this time.  Patient was seen and evaluated by Dr. Eulis Foster who is agreeable to the above plan and disposition. Final Clinical Impression(s) / ED Diagnoses Final diagnoses:  COPD exacerbation [facility])    Rx / DC Orders ED Discharge Orders    None       Garald Balding, PA-C 06/28/20 1644    Daleen Bo, MD 06/29/20 [phone]

## 2020-06-28 NOTE — ED Notes (Signed)
Report given to rn on 3e 

## 2020-06-28 NOTE — Progress Notes (Signed)
RT note. Pt refused CPAP tonight. Pt. On 7L salter sat 94%. RT will continue to monitor

## 2020-06-28 NOTE — H&P (Signed)
Date: 06/28/2020               Patient Name:  Kristen [name] MRN: [id number]  DOB: 05/09/1946 Age / Sex: 74 y.o., female   PCP: Patient, No Pcp Per         Medical Service: Internal Medicine Teaching Service         Attending Physician: Dr. Jimmye Norman, Elaina Pattee, MD    First Contact: Dr. Linwood Dibbles Pager: [phone]  Second Contact: Dr. Modena Nunnery Pager: [phone]       After Hours (After 5p/  First Contact Pager: [phone]  weekends / holidays): Second Contact Pager: [phone]   Chief Complaint: Shortness of breath  History of Present Illness: Patient is a 74 year old female with PMH of COPD, chronic venous stasis and chronic cellulitis presenting with a complaint of shortness of breath, cough and chest pain.  Patient states he has had shortness of breath for past 2 to 3 days.  She has had associated productive cough with yellow sputum.  She endorses chest pain that is brought on by coughing fits.  Patient also endorses fatigue for several days, nausea, weight lost, dry heaving, chills and congestion. Patient denies any headache, abdominal pain, fever, sore throat, hemoptysis, leg swelling.   Of note, patient states she lives in [place] but has been visiting family members here in [place].  She was   In ED, patient was found to be hypoxic with 79% RA.  She was placed on 10 L HF O2 which improved her SPO2 to 99%.  Patient complained of some nausea and was given Zofran and 1000 cc of normal saline.  EKG was unremarkable  Meds: Potassium pills, multivitamin, Spiriva, albuterol No outpatient medications have been marked as taking for the 06/28/20 encounter [facility] Encounter).     Allergies: Seasonal allergies Allergies as of 06/28/2020 - Review Complete 06/28/2020  Allergen Reaction Noted  . Gabapentin Other (See Comments) 06/28/2020   Past Medical History:  Diagnosis Date  . Cellulitis   . COPD (chronic obstructive pulmonary disease) (HCC)     Family  History: Family history of diabetes and diverticulitis. No family history of lung disease or cancer.  Social History: Patient states she has been smoking 2-3 packs of cigarette a day for "a long time" and "since I was young". She has taken a break from smoking before but now she wants to quit. She states she used to drink a lot but now limits herself to 1-2 drinks a week. Denies any drug use. Lives in [place]. Here to visit family. States she does not eat like she should and has lost a significant amount of weight. She has been trying protein shakes and smoothies to increase her protein intake.   Review of Systems: A complete ROS was negative except as per HPI.   Physical Exam: Blood pressure 114/78, pulse (!) 111, temperature 97.8 F (36.6 C), temperature source Oral, resp. rate 19, height _0  (1.778 m), weight 44 kg, SpO2 92 %.  General: Cachectic and thin elderly woman laying in bed with ongoing nebulized treatment.  No acute distress. Conversaional Head: [place]/AT HEENT: PERRLA. Poor dentition with missing teeth.  CV: Tachycardic. Regular rhythm. No m/r/g. No LE edema  Pulmonary: Congested lung. Bilateral expiratory and inspiratory wheezes throughout lung. Mild increased WOB. No accessory muscle use. Abdomen: Soft, non-tender, non-distended. Normal Bowel sounds Extremities: Varicose veins. Faint dorsalis pedis pulse bilaterally. 2+ BUE pulses. Skin: Chronic, erythematous wounds on bilateral ankles (See  picture below) Neuro: A&O x3. Normal sensation. Moves all extremities. Psych: Normal mood and affect  Left Foot   Right Foot      EZM:OQHUT rhythm with borderline right axis deviation  CXR: Cardiomegaly. No pulmonary venous congestion. Bilateral interstitial prominence, most prominent the right lung base noted. Interstitial edema and/or pneumonitis could present this fashion. Small bilateral pleural effusions.  Assessment & Plan by Problem: Active Problems:   COPD  exacerbation ([place])  Ms. Lehenbauer is a 74 yo w/ a significant PMH of COPD, current smoker, Chronic venous stasis and chronic cellulitis who presented with worsening SOB and cough secondary to COPD exacerbation. Respiratory status improved s/p steroids and nebulized treatment.   #COPD Exacerbation Current smoker with a history of COPD exacerbations presented with increased shortness of breath and found to be hypoxic with O2 sats of 79% on room air on admission. O2 sat improved with HFNC O2 and nebulized treatment. Patient now satting at 92% on 3LNC. CXR shows bilateral pleural effusion and bilateral interstitial prominence. Covid neg.  --S/p SOLU-MEDROL 60 mg x2 doses  --Duoneb nebulizer treatment q6hrs --Dulera inhaler I puff BID --Azithromycin 500 mg IV at 250 mL/hr for 1 dose followed by Azithromycin 500 mg tab po daily --Normal procalcitonin, lactic acid --Incentive Spirometry  --PT/OT eval  #Leukocytosis with left shift Found to have a white count of 16.8 on admission. Patient has been afebrile. Recent hospitalization for cellulitis so likely not due to a new infectious process. Normal procalcitonin levels. --UA pending --Monitor --Consider Bcx if patient becomes febrile --Daily CBC  #Hypoxemia Blood gas shows respiratory acidosis 2/2 CO2 retention. No end-organ damage. Respiratory status improved but patient showed increased WOB on exam. Normal mental status.   --Morning ABGs --Normal lactic acid --BiBAP at night  #Hyponatremia Na of 131 on admission. Likely 2/2 to poor po intake and dehydration. Normal renal function --Daily BMP  #Chronic cellulitis Patient has had chronic cellulitis and was recently hospitalized for cellulitis 2 weeks ago at [facility]. Completed an antibiotic course. --Continue wound care  #Malnutrition Albumin of 2.9. Patient is cachetic and flail and thin on exam. Reports decreased caloric intake and significant weight lost --Encourage high  protein meals --Supplemental meals  #Smoker Significant history of smoking. Interested in quitting. --Nicotine patch  FEN: Regular with supplement VTE ppx: SQ Lovenox  CODE STATUS: DNR   Prior to Admission Living Arrangement: Home Anticipated Discharge Location: Home Barriers to Discharge: Improved respiratory status  Dispo: Admit patient to Inpatient with expected length of stay greater than 2 midnights.  Signed: Lacinda Axon, MD 06/28/2020, 5:51 PM  Pager: [phone] Internal Medicine Teaching Service After 5pm on weekdays and 1pm on weekends: On Call pager: [phone]

## 2020-06-28 NOTE — Plan of Care (Signed)
  Problem: Activity: Goal: Ability to tolerate increased activity will improve Outcome: Progressing   Problem: Clinical Measurements: Goal: Respiratory complications will improve Outcome: Progressing   Problem: Coping: Goal: Level of anxiety will decrease Outcome: Progressing

## 2020-06-28 NOTE — Progress Notes (Signed)
Pt. Requesting med or nausea. On call for IMTS paged to make aware.

## 2020-06-29 DIAGNOSIS — J441 Chronic obstructive pulmonary disease with (acute) exacerbation: Secondary | ICD-10-CM

## 2020-06-29 LAB — CBC WITH DIFFERENTIAL/PLATELET
Abs Immature Granulocytes: 0.1 10*3/uL — ABNORMAL HIGH (ref 0.00–0.07)
Basophils Absolute: 0 10*3/uL (ref 0.0–0.1)
Basophils Relative: 0 %
Eosinophils Absolute: 0 10*3/uL (ref 0.0–0.5)
Eosinophils Relative: 0 %
HCT: 33.5 % — ABNORMAL LOW (ref 36.0–46.0)
Hemoglobin: 10.6 g/dL — ABNORMAL LOW (ref 12.0–15.0)
Immature Granulocytes: 1 %
Lymphocytes Relative: 9 %
Lymphs Abs: 1.1 10*3/uL (ref 0.7–4.0)
MCH: 30.4 pg (ref 26.0–34.0)
MCHC: 31.6 g/dL (ref 30.0–36.0)
MCV: 96 fL (ref 80.0–100.0)
Monocytes Absolute: 0.5 10*3/uL (ref 0.1–1.0)
Monocytes Relative: 4 %
Neutro Abs: 10.8 10*3/uL — ABNORMAL HIGH (ref 1.7–7.7)
Neutrophils Relative %: 86 %
Platelets: 376 10*3/uL (ref 150–400)
RBC: 3.49 MIL/uL — ABNORMAL LOW (ref 3.87–5.11)
RDW: 13.2 % (ref 11.5–15.5)
WBC: 12.5 10*3/uL — ABNORMAL HIGH (ref 4.0–10.5)
nRBC: 0 % (ref 0.0–0.2)

## 2020-06-29 LAB — COMPREHENSIVE METABOLIC PANEL
ALT: 12 U/L (ref 0–44)
AST: 17 U/L (ref 15–41)
Albumin: 2.3 g/dL — ABNORMAL LOW (ref 3.5–5.0)
Alkaline Phosphatase: 79 U/L (ref 38–126)
Anion gap: 10 (ref 5–15)
BUN: 24 mg/dL — ABNORMAL HIGH (ref 8–23)
CO2: 26 mmol/L (ref 22–32)
Calcium: 8.8 mg/dL — ABNORMAL LOW (ref 8.9–10.3)
Chloride: 91 mmol/L — ABNORMAL LOW (ref 98–111)
Creatinine, Ser: 0.84 mg/dL (ref 0.44–1.00)
GFR calc Af Amer: >60 mL/min (ref >60)
GFR calc non Af Amer: >60 mL/min (ref >60)
Glucose, Bld: 169 mg/dL — ABNORMAL HIGH (ref 70–99)
Potassium: 4.3 mmol/L (ref 3.5–5.1)
Sodium: 127 mmol/L — ABNORMAL LOW (ref 135–145)
Total Bilirubin: 0.5 mg/dL (ref 0.3–1.2)
Total Protein: 5.4 g/dL — ABNORMAL LOW (ref 6.5–8.1)

## 2020-06-29 LAB — HIV ANTIBODY (ROUTINE TESTING W REFLEX): HIV Screen 4th Generation wRfx: NONREACTIVE

## 2020-06-29 LAB — LACTIC ACID, PLASMA
Lactic Acid, Venous: 3.4 mmol/L (ref 0.5–1.9)
Lactic Acid, Venous: 2 mmol/L (ref 0.5–1.9)
Lactic Acid, Venous: 2.7 mmol/L (ref 0.5–1.9)

## 2020-06-29 LAB — HEMOGLOBIN A1C
Hgb A1c MFr Bld: 5.6 % (ref 4.8–5.6)
Mean Plasma Glucose: 114.02 mg/dL

## 2020-06-29 MED ORDER — SODIUM CHLORIDE 0.9 % IV BOLUS
1000.0000 mL | Freq: Once | INTRAVENOUS | Status: AC
  Administered 2020-06-29: 1000 mL via INTRAVENOUS

## 2020-06-29 MED ORDER — ADULT MULTIVITAMIN W/MINERALS CH
1.0000 | ORAL_TABLET | Freq: Every day | ORAL | Status: DC
  Administered 2020-06-29 – 2020-07-03 (×5): 1 via ORAL
  Filled 2020-06-29 (×6): qty 1

## 2020-06-29 MED ORDER — LACTATED RINGERS IV BOLUS
1000.0000 mL | Freq: Once | INTRAVENOUS | Status: AC
  Administered 2020-06-29: 1000 mL via INTRAVENOUS

## 2020-06-29 MED ORDER — OXYCODONE-ACETAMINOPHEN 5-325 MG PO TABS
1.0000 | ORAL_TABLET | ORAL | Status: DC | PRN
  Administered 2020-06-29 – 2020-07-03 (×21): 1 via ORAL
  Filled 2020-06-29 (×21): qty 1

## 2020-06-29 NOTE — Progress Notes (Signed)
OT Cancellation Note  Patient Details Name: Kristen [name] MRN: [id number] DOB: 06-Mar-1946   Cancelled Treatment:    Reason Eval/Treat Not Completed: Other (comment) (MD team entering during initiation of OT eval. Assessing B LE wounds and possibly re-dressing. Will check back later for functional evaluation as time allows.  )  Layla Maw 06/29/2020, 7:40 AM

## 2020-06-29 NOTE — Progress Notes (Signed)
Subjective:   OV: RT attempted to put patient BiPAP. Patient tried it and refused it because she was feeling claustrophobic. She complained of nausea and was given zofran. Developed a critical lactic acid of 3.4 and was given 1 L NS bolus. Lactic acid is now at 2.0  Patient was evaluated at bedside. Patient states she is feeling much better today. States she tried putting the BiPAP on but she is claustrophobic and could not keep it on. She slept fine, just feels weak. She does not feeling like eating but she knows she needs to eat to get stronger and gain some of her weight back.   Kristen [name] states her PCP is Dr. [email or web address] [email or web address] at [facility] [phone]  Objective:  Vital signs in last 24 hours: Vitals:   06/28/20 2015 06/28/20 2106 06/29/20 0042 06/29/20 0100  BP:  103/64 94/60   Pulse:  (!) 105 87   Resp:  18 18   Temp:  98.2 F (36.8 C) 97.9 F (36.6 C)   TempSrc:  Oral Oral   SpO2: 93% 97% 98% 94%  Weight:      Height:       General: Cachectic and thin elderly woman laying in bed on 7L HFNC  Head: /AT HEENT: PERRLA. Poor dentition with missing teeth.  CV: Tachycardic. Regular rhythm. No m/r/g. No LE edema  Pulmonary: Pulmonary cachexia. Distant lung sounds. No wheezing or rales. No increased WOB. No accessory muscle use. O2 down to 3 L w/ Sat of 91-93% Abdomen: Soft, non-tender, non-distended. Normal Bowel sounds Extremities: Varicose veins. Faint dorsalis pedis pulse bilaterally. 2+ BUE pulses. Skin: Chronic, erythematous wounds with granulation tissue and mild exudate on bilateral ankles, (See picture on H&P). Decreased skin turgor on extremities.  Neuro: A&O x3. Normal sensation. Moves all extremities. Psych: Normal mood and affect  Assessment/Plan:  Active Problems:   COPD exacerbation (HCC)   Hypoxemia   Protein-calorie malnutrition (HCC)   Hyponatremia  Kristen [name] is a 74 yo w/ a significant PMH of COPD, current smoker, Chronic venous stasis and chronic  cellulitis who presented with worsening SOB and cough secondary to COPD exacerbation. Respiratory status improved s/p steroids and nebulized treatment.   #COPD Exacerbation Current smoker with a history of COPD exacerbations presented with increased shortness of breath and found to be hypoxic with O2 sats of 79% on room air on admission. CXR shows bilateral pleural effusion and bilateral interstitial prominence. Covid neg. Unable to wear BiPAP last night but slept well on 7L HFNC. Now down to 2 L HFNC with sats around 92-94%.  --S/p SOLU-MEDROL 60 mg x2 doses --Start prednisone 40 mg daily for 5 days  --Continue Duoneb nebulizer treatment q6hrs --Dulera inhaler I puff BID -- Azithromycin 500 mg tab po daily x4 doses --Normal procalcitonin --Incentive Spirometry  --PT/OT eval  #Leukocytosis with left shift Found to have a white count of 16.8 on admission, now down to 12.5. Continue to be afebrile. Likely due to viral infection. Normal procalcitonin levels. --RVP detected coronavirus, negative for Covid19 --Follow up UA  --Daily CBC  #Hypoxemia #Lactic acidosis Blood gas shows respiratory acidosis 2/2 CO2 retention. Elevated lactic acid of 3.4 now trending down s/p 1 L of normal saline. Lactic acid now at 2.0. Patient unable to tolerate BiPAP at night.  --Monitor respiratory status --ABGs --Give another bolus --Follow afternoon lactic acid  #Hyponatremia Na of 131 on admission.  Slight drop to 127 today. Likely 2/2 to poor po intake and dehydration.  Normal renal function.  S/p 1000 cc normal saline bolus. --Daily BMP  #Chronic cellulitis Patient has had chronic cellulitis and was recently hospitalized for cellulitis 2 weeks ago at [facility]. Completed an antibiotic course. --No antibiotics indicated at this time. --Continue wound care  #Malnutrition Albumin of 2.9 on admission, now 2.3. Patient is cachetic and thin on exam.  BMI of 13.92 kg/m. Reports decreased  caloric intake and significant weight lost --Continue supplemental meals  #Smoker Significant history of smoking. Interested in quitting. --Continue nicotine patch  Prior to Admission Living Arrangement: Home Anticipated Discharge Location: Home Barriers to Discharge: Improvement in respiratory status Dispo: Anticipated discharge in approximately 1-2 day(s).   Lacinda Axon, MD 06/29/2020, 6:21 AM Pager: [phone] Internal Medicine Teaching Service After 5pm on weekdays and 1pm on weekends: On Call pager [phone]

## 2020-06-29 NOTE — Evaluation (Signed)
Physical Therapy Evaluation Patient Details Name: Kristen [name] MRN: [id number] DOB: [birth year] Today's Date: 06/29/2020   History of Present Illness  Patient is a 74 year old female with PMH of COPD, chronic venous stasis and chronic cellulitis presenting with a complaint of shortness of breath, cough and chest pain.  Patient states he has had shortness of breath for past 2 to 3 days. Pt lives in [place], visiting family. Pt admitted for further workup.    Clinical Impression  Patient was received sitting up in chair finishing lunch. She was very talkative and easily distracted, however, able to give history of home set-up and PLOF during evaluation. At rest on 2 L O2, pt was above 92% sat. Pt required MinA in sit <> stand transfer and throughout gait training without AD approximately 30' on 2 L O2. During ambulation, patient used furniture and required steadying from SPT to help maintain balance. Suggest use of AD in future PT sessions to help maintain balance and safety during functional activity. O2 stayed above 95% while walking. After activity, pt desat to 78% while on 2L O2 as she was talking on the phone, but rose back to 92% after PLB. Left in room with all needs within reach and chair alarm set. Pt stated she felt like she was below her baseline level of activity. Recommend PT f/u with SNF and use of RW during ambulation.    Follow Up Recommendations SNF;Supervision/Assistance - 24 hour    Equipment Recommendations  Rolling walker with 5" wheels    Recommendations for Other Services       Precautions / Restrictions Precautions Precautions: Fall Precaution Comments: monitor O2 Restrictions Weight Bearing Restrictions: No      Mobility  Bed Mobility Overal bed mobility: Needs Assistance Bed Mobility: Supine to Sit     Supine to sit: Supervision     General bed mobility comments: received in chair  Transfers Overall transfer level: Needs assistance Equipment used:  None Transfers: Sit to/from Stand;Stand Pivot Transfers Sit to Stand: Min assist Stand pivot transfers: Min assist       General transfer comment: Min A to stand and help maintain balance.  Ambulation/Gait Ambulation/Gait assistance: Min assist Gait Distance (Feet): 30 Feet Assistive device: None   Gait velocity: decreased   General Gait Details: Pt used assist of furniture and SPT to maintain balance while walking. Decreased stride length and gait speed, consider AD in future sessions.  Stairs            Wheelchair Mobility    Modified Rankin (Stroke Patients Only)       Balance Overall balance assessment: Needs assistance Sitting-balance support: Feet supported Sitting balance-Leahy Scale: Fair   Postural control: Left lateral lean Standing balance support: No upper extremity supported;During functional activity Standing balance-Leahy Scale: Fair Standing balance comment: Use of furniture/SPT to balance while walking                             Pertinent Vitals/Pain Pain Assessment: No/denies pain Faces Pain Scale: No hurt Pain Location: B feet Pain Descriptors / Indicators: Burning Pain Intervention(s): Monitored during session    Home Living Family/patient expects to be discharged to:: Private residence Living Arrangements: Children;Other relatives (daughter, son in law, 58 y/o grandson) Available Help at Discharge: Family;Available PRN/intermittently (children work, grandson around "most of the time") Type of Home: Other(Comment) (townhouse) Home Access: Stairs to enter   Technical brewer of Steps: 1 Home Layout: Two  level;Able to live on main level with bedroom/bathroom Home Equipment: Shower seat Additional Comments: has nurse that comes to help with wound care; she can cook and clean on her own, also does laundry on her own. Just stopped working 3 years ago.    Prior Function Level of Independence: Independent          Comments: Reports Independence with ADLs, IADls, and mobility. Pt was driving. No use of AD     Hand Dominance   Dominant Hand: Right    Extremity/Trunk Assessment   Upper Extremity Assessment Upper Extremity Assessment: Defer to OT evaluation    Lower Extremity Assessment Lower Extremity Assessment: Overall WFL for tasks assessed    Cervical / Trunk Assessment Cervical / Trunk Assessment: Normal  Communication   Communication: No difficulties  Cognition Arousal/Alertness: Awake/alert Behavior During Therapy: WFL for tasks assessed/performed Overall Cognitive Status: No family/caregiver present to determine baseline cognitive functioning                                 General Comments: Decreased awareness of deficits and safety. Pt with tangential conversation, difficult to redirect to focus on PLB with desats      General Comments General comments (skin integrity, edema, etc.): Pt on 2 L O2. Stayed above 95% while walking, however desat to 78% afterwards while talking on the phone.    Exercises     Assessment/Plan    PT Assessment Patient needs continued PT services  PT Problem List Decreased mobility;Decreased safety awareness;Decreased activity tolerance;Decreased cognition;Decreased balance;Decreased knowledge of use of DME       PT Treatment Interventions DME instruction;Therapeutic exercise;Gait training;Balance training;Functional mobility training;Therapeutic activities;Patient/family education    PT Goals (Current goals can be found in the Care Plan section)  Acute Rehab PT Goals Patient Stated Goal: go home, be able to eat PT Goal Formulation: With patient Time For Goal Achievement: 07/13/20 Potential to Achieve Goals: Good    Frequency Min 3X/week   Barriers to discharge        Co-evaluation               AM-PAC PT "6 Clicks" Mobility  Outcome Measure Help needed turning from your back to your side while in a flat bed  without using bedrails?: A Little Help needed moving from lying on your back to sitting on the side of a flat bed without using bedrails?: A Little Help needed moving to and from a bed to a chair (including a wheelchair)?: A Little Help needed standing up from a chair using your arms (e.g., wheelchair or bedside chair)?: A Little Help needed to walk in hospital room?: A Little Help needed climbing 3-5 steps with a railing? : A Lot 6 Click Score: 17    End of Session Equipment Utilized During Treatment: Gait belt Activity Tolerance: Patient tolerated treatment well Patient left: with chair alarm set;in chair;with call bell/phone within reach   PT Visit Diagnosis: Unsteadiness on feet (R26.81);Other abnormalities of gait and mobility (R26.89)    Time:  -      Charges:              Livingston Diones, SPT, ATC

## 2020-06-29 NOTE — Evaluation (Signed)
 Occupational Therapy Evaluation Patient Details Name: Kristen [name] MRN: [id number] DOB: 16-Mar-1946 Today's Date: 06/29/2020    History of Present Illness Patient is a 74 year old female with PMH of COPD, chronic venous stasis and chronic cellulitis presenting with a complaint of shortness of breath, cough and chest pain.  Patient states he has had shortness of breath for past 2 to 3 days. Pt lives in [place], visiting family. Pt admitted for further workup.   Clinical Impression   PTA, pt lives with family and reports Independence with ADLs, IADLs and mobility without AD. Pt reports still driving. Pt received on 3 L O2, 92-95% at rest. Attempted activities on RA, but pt desats to 80% when talking so 3 LO2 re-applied. Pt requires Min A for simple transfers and up to Mod A for LB ADLs due to decreased balance, low endurance, and decreased strength. Pt sustained upper 80s during activities on 3 L O2. Pt with frequent posterior LOB, resulting in abrupt sitting on bed and/or chair. Recommend SNF for short term rehab based on functional abilities and high fall risk.     Follow Up Recommendations  SNF;Supervision/Assistance - 24 hour    Equipment Recommendations  3 in 1 bedside commode;Other (comment) (RW)    Recommendations for Other Services       Precautions / Restrictions Precautions Precautions: Fall Precaution Comments: monitor O2 Restrictions Weight Bearing Restrictions: No      Mobility Bed Mobility Overal bed mobility: Needs Assistance Bed Mobility: Supine to Sit     Supine to sit: Supervision     General bed mobility comments: Supervision for safety, no assist needed  Transfers Overall transfer level: Needs assistance Equipment used: None Transfers: Sit to/from Stand;Stand Pivot Transfers Sit to Stand: Min assist Stand pivot transfers: Min assist       General transfer comment: Min A for steadying. Frequent posterior LOB with abrupt sitting on bed or chair.  Would benefit from RW trial    Balance Overall balance assessment: Needs assistance Sitting-balance support: Bilateral upper extremity supported;Feet supported Sitting balance-Leahy Scale: Fair     Standing balance support: No upper extremity supported;During functional activity Standing balance-Leahy Scale: Poor Standing balance comment: frequent posterior LOB resulting in abrupt sitting on bed or chair                           ADL either performed or assessed with clinical judgement   ADL Overall ADL's : Needs assistance/impaired Eating/Feeding: Set up;Sitting   Grooming: Set up;Sitting;Wash/dry face   Upper Body Bathing: Supervision/ safety;Sitting   Lower Body Bathing: Minimal assistance;Sit to/from stand Lower Body Bathing Details (indicate cue type and reason): Min A to maintain balancein standing for peri care after urine incontinence Upper Body Dressing : Supervision/safety;Sitting Upper Body Dressing Details (indicate cue type and reason): Supervision with cues to attend to task to don new hospitalgown Lower Body Dressing: Sit to/from stand;Moderate assistance Lower Body Dressing Details (indicate cue type and reason): Min A to don underwear around feet and waist in standing due to difficulty maintaining balance Toilet Transfer: Minimal assistance;Stand-pivot Toilet Transfer Details (indicate cue type and reason): simulated to recliner Toileting- Clothing Manipulation and Hygiene: Sit to/from stand;Moderate assistance         General ADL Comments: Pt with decreased activity tolerance, strength, standing balance and mild cognitive impairments impacted safety with ADLs     Vision Baseline Vision/History: Wears glasses Wears Glasses: Reading only Patient Visual Report: No change from  baseline Vision Assessment?: No apparent visual deficits     Perception     Praxis      Pertinent Vitals/Pain Pain Assessment: Faces Faces Pain Scale: Hurts little  more Pain Location: B feet Pain Descriptors / Indicators: Burning Pain Intervention(s): Monitored during session     Hand Dominance Right   Extremity/Trunk Assessment Upper Extremity Assessment Upper Extremity Assessment: Generalized weakness   Lower Extremity Assessment Lower Extremity Assessment: Defer to PT evaluation   Cervical / Trunk Assessment Cervical / Trunk Assessment: Normal   Communication Communication Communication: No difficulties   Cognition Arousal/Alertness: Awake/alert Behavior During Therapy: WFL for tasks assessed/performed Overall Cognitive Status: No family/caregiver present to determine baseline cognitive functioning                                 General Comments: P A&Ox4. Decreased awareness of deficits, safety. Pt with tangential conversation, difficult to redirect to focus on PLB with desats   General Comments  Pt on 3 L O2, 93-95% at rest. Trialed on RA with desats to 80% when talking bed level. Hovered upper 80s on 3 L O2 during activities.    Exercises     Shoulder Instructions      Home Living Family/patient expects to be discharged to:: Private residence Living Arrangements: Children;Other relatives (daughter, son in law, 66 y/o grandson) Available Help at Discharge: Family;Available PRN/intermittently (children work, grandson around "most of the time") Type of Home: Apartment Home Access: Stairs to enter Technical  of Steps: 1   Home Layout: Two level;Able to live on main level with bedroom/bathroom     Bathroom Shower/Tub: Tub/shower unit         Home Equipment: Shower seat          Prior Functioning/Environment Level of Independence: Independent        Comments: Reports Independence with ADLs, IADls, and mobility. Pt was driving. No use of AD        OT Problem List: Decreased strength;Impaired balance (sitting and/or standing);Decreased activity tolerance;Decreased cognition;Decreased  safety awareness;Decreased knowledge of use of DME or AE;Cardiopulmonary status limiting activity      OT Treatment/Interventions: Self-care/ADL training;Therapeutic exercise;Energy conservation;DME and/or AE instruction;Therapeutic activities;Patient/family education    OT Goals(Current goals can be found in the care plan section) Acute Rehab OT Goals Patient Stated Goal: go home, be able to eat OT Goal Formulation: With patient Time For Goal Achievement: 07/13/20 Potential to Achieve Goals: Good ADL Goals Pt Will Perform Grooming: with supervision;standing Pt Will Perform Lower Body Bathing: with supervision;sit to/from stand Pt Will Perform Lower Body Dressing: with supervision;sit to/from stand Pt Will Transfer to Toilet: with supervision;ambulating;regular height toilet Pt Will Perform Toileting - Clothing Manipulation and hygiene: with supervision;sit to/from stand  OT Frequency: Min 2X/week   Barriers to D/C:            Co-evaluation              AM-PAC OT "6 Clicks" Daily Activity     Outcome Measure Help from another person eating meals?: A Little Help from another person taking care of personal grooming?: A Little Help from another person toileting, which includes using toliet, bedpan, or urinal?: A Lot Help from another person bathing (including washing, rinsing, drying)?: A Little Help from another person to put on and taking off regular upper body clothing?: A Little Help from another person to put on and taking off regular lower  body clothing?: A Lot 6 Click Score: 16   End of Session Equipment Utilized During Treatment: Oxygen  Activity Tolerance: Patient tolerated treatment well Patient left: in chair;with call bell/phone within reach;with chair alarm set  OT Visit Diagnosis: Unsteadiness on feet (R26.81);Other abnormalities of gait and mobility (R26.89);Muscle weakness (generalized) (M62.81)                Time: 9923-4144 OT Time Calculation (min): 44  min Charges:  OT General Charges $OT Visit: 1 Visit OT Evaluation $OT Eval Moderate Complexity: 1 Mod OT Treatments $Self Care/Home Management : 23-37 mins  Layla Maw, OTR/L  Layla Maw 06/29/2020, 2:29 PM

## 2020-06-29 NOTE — Progress Notes (Signed)
Critical Lactic acid level of 3.4. On call for IMTS aware.

## 2020-06-29 NOTE — Progress Notes (Signed)
Initial Nutrition Assessment  RD working remotely.  DOCUMENTATION CODES:   Underweight  INTERVENTION:  Continue Ensure Enlive po BID, each supplement provides 350 kcal and 20 grams of protein. Patient prefers strawberry.   RD will order bedtime snack of strawberry Greek yogurt.   Provide MVI daily.  Encouraged adequate intake of calories and protein from meals, snacks, and oral nutrition supplements.  Monitor magnesium, potassium, and phosphorus daily for at least 3 days, MD to replete as needed, as pt is at risk for refeeding syndrome.  NUTRITION DIAGNOSIS:   Increased nutrient needs related to wound healing, catabolic illness (COPD) as evidenced by estimated needs.  GOAL:   Patient will meet greater than or equal to 90% of their needs  MONITOR:   PO intake, Supplement acceptance, Labs, Weight trends, Skin, I & O's  REASON FOR ASSESSMENT:   Consult Assessment of nutrition requirement/status  ASSESSMENT:   74 year old female with PMHx of COPD, chronic venous stasis, and chronic cellulitis admitted with acute exacerbation of COPD.   Spoke with patient over the phone. She reports she has had a poor appetite for a while now and has not been eating well at home. She reports she has trouble eating when she has pain from her ulcers or when she has nausea. She reports she eats very little at home. She is unable to describe typical intake after several attempts of RD asking for more details but does report she usually keeps strawberry yogurt and strawberry Ensure at the house. Patient reports she just talked with MD this morning and understands she needs to eat more calories and protein. She would like strawberry Ensure to drink. She ate 100% of her breakfast tray this morning. She ordered pancakes with syrup, oatmeal, applesauce, grape juice, 2% milk, and coffee (total of 793 kcal and 24 grams of protein). Patient lives at [facility] and was here visiting family. Noted MD ordered  patient for Ensure Enlive po BID between meals this morning.  Patient reports her UBW was 138-140 lbs and she has lost approximately 40 lbs over a period of 5-6 months. This would be a weight loss of approximately 28.5% body weight over 6 months, which is significant for time frame. No weight history in chart to trend since patient is from out of town. Even if this weight loss occurred over a year's time that would still be significant.  Medications reviewed and include: azithromycin, nicotine patch, prednisone 40 mg daily with breakfast.  Labs reviewed: Sodium 127, Chloride 91, BUN 24.  RD suspects patient is malnourished but unable to determine if she meets criteria at this time without more detailed nutrition history or NFPE.  NUTRITION - FOCUSED PHYSICAL EXAM:  Unable to complete at this time as RD is working remotely.  Diet Order:   Diet Order            Diet regular Room service appropriate? Yes; Fluid consistency: Thin  Diet effective now                EDUCATION NEEDS:   Education needs have been addressed  Skin:  Skin Assessment: Skin Integrity Issues: Skin Integrity Issues:: Other (Comment) Other: chronic venous stasis ulcers to right and left heels  Last BM:  Unknown  Height:   Ht Readings from Last 1 Encounters:  06/28/20 _0  (1.778 m)   Weight:   Wt Readings from Last 1 Encounters:  06/28/20 44 kg   Ideal Body Weight:  68.2 kg  BMI:  Body mass index is 13.92 kg/m.  Estimated Nutritional Needs:   Kcal:  1500-1700  Protein:  70-80 grams  Fluid:  1.2-1.5 L/day  Jacklynn Barnacle, MS, RD, LDN Pager number available on Amion

## 2020-06-29 NOTE — Progress Notes (Signed)
RT note. Pt. Attempted to be placed on BIPAP. Allowed pt. To adjust to BIPAP for aprox. 1 min. Pt. Stated she is feeling nauseated, and claustrophobic. Pt. Wishes not to be placed on BIPAP. Pt. Returned to 7L [place], sat 91-93%. RT will continue to monitor.

## 2020-06-30 LAB — BASIC METABOLIC PANEL
Anion gap: 9 (ref 5–15)
BUN: 16 mg/dL (ref 8–23)
CO2: 29 mmol/L (ref 22–32)
Calcium: 9.2 mg/dL (ref 8.9–10.3)
Chloride: 92 mmol/L — ABNORMAL LOW (ref 98–111)
Creatinine, Ser: 0.71 mg/dL (ref 0.44–1.00)
GFR calc Af Amer: >60 mL/min (ref >60)
GFR calc non Af Amer: >60 mL/min (ref >60)
Glucose, Bld: 109 mg/dL — ABNORMAL HIGH (ref 70–99)
Potassium: 4.6 mmol/L (ref 3.5–5.1)
Sodium: 130 mmol/L — ABNORMAL LOW (ref 135–145)

## 2020-06-30 LAB — URINALYSIS, ROUTINE W REFLEX MICROSCOPIC
Bilirubin Urine: NEGATIVE
Glucose, UA: NEGATIVE mg/dL
Hgb urine dipstick: NEGATIVE
Ketones, ur: NEGATIVE mg/dL
Leukocytes,Ua: NEGATIVE
Nitrite: NEGATIVE
Protein, ur: NEGATIVE mg/dL
Specific Gravity, Urine: 1.009 (ref 1.005–1.030)
pH: 6 (ref 5.0–8.0)

## 2020-06-30 LAB — MAGNESIUM: Magnesium: 1.5 mg/dL — ABNORMAL LOW (ref 1.7–2.4)

## 2020-06-30 LAB — PHOSPHORUS: Phosphorus: 2.1 mg/dL — ABNORMAL LOW (ref 2.5–4.6)

## 2020-06-30 MED ORDER — ORAL CARE MOUTH RINSE
15.0000 mL | Freq: Two times a day (BID) | OROMUCOSAL | Status: DC
  Administered 2020-07-01 – 2020-07-03 (×5): 15 mL via OROMUCOSAL

## 2020-06-30 MED ORDER — K PHOS MONO-SOD PHOS DI & MONO 155-852-130 MG PO TABS
500.0000 mg | ORAL_TABLET | Freq: Every day | ORAL | Status: DC
  Administered 2020-06-30 – 2020-07-03 (×4): 500 mg via ORAL
  Filled 2020-06-30 (×4): qty 2

## 2020-06-30 MED ORDER — MAGNESIUM SULFATE 2 GM/50ML IV SOLN
2.0000 g | Freq: Once | INTRAVENOUS | Status: AC
  Administered 2020-06-30: 2 g via INTRAVENOUS
  Filled 2020-06-30: qty 50

## 2020-06-30 NOTE — Consult Note (Signed)
[facility] Nurse Consult Note: Reason for Consult:Bilateral medial foot wounds, chronic, nonhealing. R>L. Recently treated for cellulitis at a [place] facility. Wound type: infectious Pressure Injury POA: N/A Measurement: to be obtained by bedside RN today prior to performing wound care and documented on Nursing flow sheet Wound KMK:[id number]: red, dry  Right: 50% red, moist, 50% obscured by the presence of yellow slough Drainage (amount, consistency, odor)  Left is dry, right with small amount light yellow exudate. Periwound: erythematous, mildly edematous, right is warmer than left. Dressing procedure/placement/frequency: I have provided Nursing with guidance for the care of the foot wounds using a daily NS cleanse, pat dry and following with topical care consisting of covering the wounds with a folded xeroform gauze topped with dry gauze and secured with Kerlix roll gauze. Feet are to be placed into Prevalon boots for protection and elevation of the heels. Discussed with Bedside RN, [place].  [place] nursing team will not follow, but will remain available to this patient, the nursing and medical teams.  Please re-consult if needed. Thanks, Maudie Flakes, MSN, RN, [place], Arther Abbott  Pager# [phone]

## 2020-06-30 NOTE — Plan of Care (Signed)
  Problem: Education: Goal: Knowledge of disease or condition will improve Outcome: Progressing   Problem: Activity: Goal: Ability to tolerate increased activity will improve Outcome: Progressing

## 2020-06-30 NOTE — Progress Notes (Signed)
   Subjective: No acute events overnight. Kristen [name] continues to make progress from a breathing standpoint. She was pleased to finally get some sleep last night. She worked with PT yesterday. She is aware they recommended rehab, but thinks she needs another day or 2 of recovering before making that kind of decision. She also lives with her grandson in [place] and would need to make more arrangements at home first. She also mentioned a physical therapist was coming to her house 3x a week to work with her after her recent hospitalization for her wounds. We discussed the plan to see how she progresses over the rest of today and tomorrow and assess her needs from there. Encouraged her to get up and walk some more today to see how she does.   Objective:  Vital signs in last 24 hours: Vitals:   06/29/20 2006 06/30/20 0100 06/30/20 0400 06/30/20 0500  BP: 134/76 137/82 135/73   Pulse: 89 80 89   Resp: _0 Temp: 98.2 F (36.8 C) 98.2 F (36.8 C) 97.8 F (36.6 C) 98.1 F (36.7 C)  TempSrc: Oral Oral Oral Oral  SpO2: 94% 94% 90%   Weight:    45.2 kg  Height:       General: cachectic, elderly woman, pleasantly talkative and able to speak in full sentences  CV: RRR; no m/r/g Pulm: saturating well on 2L [place]; normal work of breathing; improved air movement with scattered expiratory wheezes   Assessment/Plan:  Active Problems:   COPD exacerbation (HCC)   Hypoxemia   Protein-calorie malnutrition (HCC)   Hyponatremia  Kristen [name] is a 74 yo w/ a significant PMH of COPD, current smoker, chronic venous stasis and chronic cellulitis who presented with worsening SOB and cough secondary to COPD exacerbation. Respiratory status improved s/p steroids and nebulized treatment.  #Acute hypoxic respiratory failure secondary to COPD exacerbation Current smoker with a history of COPD exacerbations, unvaccinated for COVID-19presented with increased shortness of breath and found to be hypoxic with  O2 sats of 79% on room air on admission. CXR shows small bilateral pleural effusion andbilateral interstitial prominence. Covid-19 neg, but RVP positive for coronavirus.  She has made steady improvement in respiratory status and oxygen requirement each day --Prednisone and azithromycin x5 days --Continue Duoneb nebulizer treatment q6hrs --Dulera inhaler I puff BID --wean O2 as able with goal O2 sat > 88% --Supportive care with Mucinex  --Incentive Spirometry  --continue PT/OT efforts    #PVD with chronic ulcerative wounds  --wounds appear to have healthy tissue; no evidence of infection --Continue wound care  #Malnutrition #Pulmonary cachexia  Albumin of2.9 on admission, now 2.3. Patient is cachetic and thin on exam.  BMI of 13.92 kg/m. Reports decreased caloric intake and significant weight loss  --Continue supplemental meals --monitor electrolytes and replete as indicated   #Smoker Significant history of smoking. Interested in quitting. --Continue nicotine patch  Prior to Admission Living Arrangement: home Anticipated Discharge Location: home  Barriers to Discharge: continued improvement in respiratory status  Dispo: Anticipated discharge in approximately 1-2 day(s).   Modena Nunnery D, DO 06/30/2020, 6:50 AM Pager: [phone] After 5pm on weekdays and 1pm on weekends: On Call pager [phone]

## 2020-06-30 NOTE — Progress Notes (Signed)
Occupational Therapy Treatment Patient Details Name: Kristen [name] MRN: [id number] DOB: 1946/09/01 Today's Date: 06/30/2020    History of present illness Patient is a 74 year old female with PMH of COPD, chronic venous stasis and chronic cellulitis presenting with a complaint of shortness of breath, cough and chest pain.  Patient states he has had shortness of breath for past 2 to 3 days. Pt lives in [place], visiting family. Pt admitted for further workup.   OT comments  Pt is able to perform ADLs with min A this date, but does fatigue quickly.  02 sats decreased to 84% on 2L 02 during activity, but improved with pursed lip breathing.  Continue to recommend SNF.   Follow Up Recommendations  SNF;Supervision/Assistance - 24 hour    Equipment Recommendations  3 in 1 bedside commode;Other (comment)    Recommendations for Other Services      Precautions / Restrictions Precautions Precautions: Fall Precaution Comments: monitor O2       Mobility Bed Mobility Overal bed mobility: Needs Assistance Bed Mobility: Supine to Sit;Sit to Supine     Supine to sit: Supervision;HOB elevated Sit to supine: Supervision;HOB elevated      Transfers Overall transfer level: Needs assistance Equipment used: None Transfers: Sit to/from Stand Sit to Stand: Min assist         General transfer comment: min A to steady    Balance Overall balance assessment: Needs assistance Sitting-balance support: Feet supported Sitting balance-Leahy Scale: Good     Standing balance support: During functional activity;No upper extremity supported Standing balance-Leahy Scale: Fair                             ADL either performed or assessed with clinical judgement   ADL Overall ADL's : Needs assistance/impaired             Lower Body Bathing: Minimal assistance;Sit to/from stand       Lower Body Dressing: Minimal assistance;Sit to/from stand Lower Body Dressing Details  (indicate cue type and reason): min A for balance  Toilet Transfer: Minimal assistance;Stand-pivot;BSC   Toileting- Clothing Manipulation and Hygiene: Minimal assistance Toileting - Clothing Manipulation Details (indicate cue type and reason): min A for balance        General ADL Comments: 02 sats decreased to 84% on 2L supplemental 02      Vision       Perception     Praxis      Cognition Arousal/Alertness: Awake/alert Behavior During Therapy: Anxious;Impulsive Overall Cognitive Status: No family/caregiver present to determine baseline cognitive functioning                                 General Comments: Pt tangential and verbose with conversation, difficult to redirect.  Pt frequently self distracts         Exercises     Shoulder Instructions       General Comments      Pertinent Vitals/ Pain       Pain Assessment: No/denies pain Faces Pain Scale: No hurt  Home Living                                          Prior Functioning/Environment  Frequency  Min 2X/week        Progress Toward Goals  OT Goals(current goals can now be found in the care plan section)        Plan Discharge plan remains appropriate    Co-evaluation                 AM-PAC OT "6 Clicks" Daily Activity     Outcome Measure   Help from another person eating meals?: None Help from another person taking care of personal grooming?: A Little Help from another person toileting, which includes using toliet, bedpan, or urinal?: A Little Help from another person bathing (including washing, rinsing, drying)?: A Little Help from another person to put on and taking off regular upper body clothing?: A Little Help from another person to put on and taking off regular lower body clothing?: A Little 6 Click Score: 19    End of Session Equipment Utilized During Treatment: Oxygen  OT Visit Diagnosis: Unsteadiness on feet  (R26.81);Other abnormalities of gait and mobility (R26.89);Muscle weakness (generalized) (M62.81)   Activity Tolerance Patient tolerated treatment well   Patient Left in bed;with call bell/phone within reach;with bed alarm set   Nurse Communication          Time: 2883-3744 OT Time Calculation (min): 35 min  Charges: OT General Charges $OT Visit: 1 Visit OT Treatments $Self Care/Home Management : 23-37 mins  Nilsa Nutting OTR/L Acute Rehabilitation Services Pager [phone] Office [phone]    Lucille Passy M 06/30/2020, 1:13 PM

## 2020-07-01 DIAGNOSIS — J9601 Acute respiratory failure with hypoxia: Secondary | ICD-10-CM

## 2020-07-01 MED ORDER — ALUM & MAG HYDROXIDE-SIMETH 200-200-20 MG/5ML PO SUSP: 15.0000 mL | Freq: Four times a day (QID) | ORAL | Status: DC | PRN

## 2020-07-01 MED ORDER — IPRATROPIUM-ALBUTEROL 0.5-2.5 (3) MG/3ML IN SOLN: 3.0000 mL | RESPIRATORY_TRACT | Status: DC | PRN

## 2020-07-01 NOTE — Discharge Summary (Signed)
Name: Kristen [name] MRN: [id number] DOB: 08-02-1946 74 y.o. PCP: Patient, No Pcp Per  Date of Admission: 06/28/2020 11:31 AM Date of Discharge:  Attending Physician: Kristen Pou, MD  Discharge Diagnosis: 1. Acute respiratory failure with hypercapnia secondary to COPD exacerbation  Discharge Medications: Allergies as of 07/03/2020      Reactions   Gabapentin Other (See Comments)   Hallucination/ nightmares      Medication List    TAKE these medications   albuterol 108 (90 Base) MCG/ACT inhaler Commonly known as: VENTOLIN HFA Inhale 2 puffs into the lungs every 6 (six) hours as needed for wheezing or shortness of breath.   busPIRone 10 MG tablet Commonly known as: BUSPAR Take 10 mg by mouth daily as needed (anxiety).   fluticasone 50 MCG/ACT nasal spray Commonly known as: FLONASE Place 1 spray into both nostrils daily as needed for allergies or rhinitis.   guaiFENesin 600 MG 12 hr tablet Commonly known as: MUCINEX Take 1 tablet (600 mg total) by mouth 2 (two) times daily.   HYDROcodone-acetaminophen 10-325 MG tablet Commonly known as: Norco Take 1 tablet by mouth every 8 (eight) hours as needed for moderate pain or severe pain.   Ibuprofen-Acetaminophen 125-250 MG Tabs Take 1 tablet by mouth 2 (two) times daily as needed (pain/headache).   mometasone-formoterol 200-5 MCG/ACT Aero Commonly known as: DULERA Inhale 1 puff into the lungs 2 (two) times daily.   multivitamin with minerals Tabs tablet Take 1 tablet by mouth daily.   nicotine 21 mg/24hr patch Commonly known as: NICODERM CQ - dosed in mg/24 hours Place 1 patch (21 mg total) onto the skin daily.   potassium chloride 10 MEQ tablet Commonly known as: KLOR-CON Take 1 tablet (10 mEq total) by mouth daily.   Spiriva Respimat 1.25 MCG/ACT Aers Generic drug: Tiotropium Bromide Monohydrate Inhale 1 puff into the lungs daily.            Discharge Care Instructions  (From admission, onward)           Start     Ordered   07/03/20 0000  Change dressing (specify)       Comments: Dressing change: 1 time per day using.   07/03/20 0819          Disposition and follow-up:   Kristen [name] was discharged from [facility] in Stable condition.  At the hospital follow up visit please address:  1.  Respiratory status: Adherence to using oxygen and inhalers at home. check O2 sats in clinic.  3. Blood pressure: Elevated during admission. Consider starting a BP agent if still elevated in clinic.   4. Nutritional status: Patient's calorie intake has dropped over the years.   5. Smoking: Patient states she wants to quit. Prescribed a nicotine patch on discharge.  5.  Labs / imaging needed at time of follow-up: BMP, CBC, electrolytes (Phos, Mg)  3.  Pending labs/ test needing follow-up: None  Follow-up Appointments: 07/09/20 with Internal Medicine Clinic [facility])  Hospital Course by problem list: 1. Acute hypoxic respiratory failure secondary to COPD exacerbation Patient is a current smoker with a history of COPD exacerbations, and unvaccinated for COVID-19who presented with increased shortness of breath and found to be hypoxic with O2 sats of 79% on room air on admission. CXR shows small bilateral pleural effusion andbilateral interstitial prominence. Covid-19 neg, but RVP positive for coronavirus. She received prednisone (x5days) and azithromycin (x4days), Duoneb nebulizer treatment q6hrs, supportive care with Mucinex, and dulera inhaler  1 puff BID. Patient made steady improvement in respiratory status and oxygen requirement each day. She was discharged to start 2L oxygen at home. She was discharged with prescriptions for albuterol and Dulera inhalers. She is to follow up at [facility]. Plans to go back to [facility] in 1 month.   2. Chronic venous insufficiency w/ ulcerative wounds  Wounds appear to have healthy tissue; no evidence of infection. Wound care was continued at  the hospital. She was discharged home with home health so a nurse will come provide wound care at home.   3. Malnutrition/Pulmonary cachexia  Albumin was 2.9on admission and 2.3 on discharge. Reported decreased caloric intake and significant weight loss the last few months. BMI of 13.92 kg/m on admission. Nutrition intake was encouraged throughout hospitalization. Patient was advised to increase protein intake at home. Likely will not regain a significant amount of her weight due to increased catabolism secondary to her emphysema but will benefit from a referral to a nutritionist.   4. Smoker Significant history of smoking. Interested in quitting. Patient was placed on a nicotine patch. Continue nicotine patch and provide smoking cessation resources.   Discharge Vitals:   BP 138/74 (BP Location: Right Arm)    Pulse 78    Temp 97.7 F (36.5 C) (Oral)    Resp 15    Ht _0  (1.778 m)    Wt 45.4 kg    SpO2 95%    BMI 14.36 kg/m   Pertinent Labs, Studies, and Procedures:  CBC Latest Ref Rng & Units 07/02/2020 06/29/2020 06/28/2020  WBC 4.0 - 10.5 K/uL 10.1 12.5(H) -  Hemoglobin 12.0 - 15.0 g/dL 11.0(L) 10.6(L) 14.6  Hematocrit 36 - 46 % 34.5(L) 33.5(L) 43.0  Platelets 150 - 400 K/uL 380 376 -   BMP Latest Ref Rng & Units 07/02/2020 06/30/2020 06/29/2020  Glucose 70 - 99 mg/dL 113(H) 109(H) 169(H)  BUN 8 - 23 mg/dL 14 16 24(H)  Creatinine 0.44 - 1.00 mg/dL 0.61 0.71 0.84  Sodium 135 - 145 mmol/L 128(L) 130(L) 127(L)  Potassium 3.5 - 5.1 mmol/L 4.2 4.6 4.3  Chloride 98 - 111 mmol/L 86(L) 92(L) 91(L)  CO2 22 - 32 mmol/L 34(H) 29 26  Calcium 8.9 - 10.3 mg/dL 8.9 9.2 8.8(L)    Discharge Instructions:   Kristen [name],   It was a pleasure to take of you during your hospital stay. Your breathing has improved while at the hospital but we want you to start using oxygen at home because your COPD has gotten worse. Continue taking care of your wound as you have previously done. Please go to your hospital  follow up appointment at our Internal medicine clinic here at the hospital next week on August 17th so they can check a few labs and see how you are doing.   Thanks, Dr. Coy Saunas  Signed: Lacinda Axon, MD 07/01/2020, 6:17 AM   Pager: [phone] Internal Medicine Teaching Service

## 2020-07-01 NOTE — NC FL2 (Signed)
[place] LEVEL OF CARE SCREENING TOOL     IDENTIFICATION  Patient Name: Kristen [name] Birthdate: 1946-05-07 Sex: female Admission Date (Current Location): 06/28/2020  [facility] and [place] Number:  Herbalist and Address:  [facility] [place], [place] [address], [place], [place] [address]      Provider Number: [id number]  Attending Physician Name and Address:  Angelica Pou, MD  Relative Name and Phone Number:       Current Level of Care: Hospital Recommended Level of Care: [place] Prior Approval Number:    Date Approved/Denied:   PASRR Number: pending  Discharge Plan: SNF    Current Diagnoses: Patient Active Problem List   Diagnosis Date Noted  . Acute respiratory failure with hypercapnia ([place]) 07/01/2020  . COPD exacerbation ([place]) 06/28/2020  . Hypoxemia 06/28/2020  . Protein-calorie malnutrition ([place]) 06/28/2020  . Hyponatremia 06/28/2020    Orientation RESPIRATION BLADDER Height & Weight     Self, Time, Situation, Place (within defined limits)  O2 (nasal canula 2L) Continent, External catheter Weight: 100 lb 1.4 oz (45.4 kg) Height:  _0  (177.8 cm)  BEHAVIORAL SYMPTOMS/MOOD NEUROLOGICAL BOWEL NUTRITION STATUS      Continent Diet (see discharge summary)  AMBULATORY STATUS COMMUNICATION OF NEEDS Skin   Limited Assist Verbally Other (Comment) (ulcers w/ gauze dressings on bilateral heels to be changed daily; generalized ecchymosis)                       Personal Care Assistance Level of Assistance  Bathing, Feeding, Dressing Bathing Assistance: Limited assistance Feeding assistance: Independent Dressing Assistance: Limited assistance     Functional Limitations Info  Sight, Hearing, Speech Sight Info: Impaired Hearing Info: Adequate Speech Info: Adequate    SPECIAL CARE FACTORS FREQUENCY  OT (By licensed OT), PT (By licensed PT)     PT Frequency: 5x week OT Frequency: 5x week             Contractures Contractures Info: Not present    Additional Factors Info  Code Status, Allergies, Isolation Precautions Code Status Info: DNR Allergies Info: Gabapentin     Isolation Precautions Info: Droplet Precautions     Current Medications (07/01/2020):  This is the current hospital active medication list Current Facility-Administered Medications  Medication Dose Route Frequency Provider Last Rate Last Admin  . alum & mag hydroxide-simeth (MAALOX/MYLANTA) 200-200-20 MG/5ML suspension 15 mL  15 mL Oral Q6H PRN Angelica Pou, MD      . azithromycin [facility]) tablet 500 mg  500 mg Oral Daily [place], Carley D, DO   500 mg at 07/01/20 0919  . enoxaparin (LOVENOX) injection 30 mg  30 mg Subcutaneous Q24H [place], Carley D, DO   30 mg at 06/30/20 1800  . feeding supplement (ENSURE ENLIVE) (ENSURE ENLIVE) liquid 237 mL  237 mL Oral BID BM Angelica Pou, MD   237 mL at 07/01/20 0920  . guaiFENesin (MUCINEX) 12 hr tablet 600 mg  600 mg Oral BID [place], Carley D, DO   600 mg at 07/01/20 0958  . ipratropium-albuterol (DUONEB) 0.5-2.5 (3) MG/3ML nebulizer solution 3 mL  3 mL Nebulization Q4H PRN Angelica Pou, MD      . MEDLINE mouth rinse  15 mL Mouth Rinse BID Angelica Pou, MD   15 mL at 07/01/20 0920  . mometasone-formoterol (DULERA) 200-5 MCG/ACT inhaler 1 puff  1 puff Inhalation BID [place], Carley D, DO   1  puff at 07/01/20 0828  . multivitamin with minerals tablet 1 tablet  1 tablet Oral Daily Angelica Pou, MD   1 tablet at 07/01/20 0919  . nicotine (NICODERM CQ - dosed in mg/24 hours) patch 21 mg  21 mg Transdermal Daily [place], Carley D, DO   21 mg at 07/01/20 0920  . ondansetron (ZOFRAN) injection 4 mg  4 mg Intravenous Q6H PRN Jeralyn Bennett, MD   4 mg at 07/01/20 1543  . oxyCODONE-acetaminophen (PERCOCET/ROXICET) 5-325 MG per tablet 1 tablet  1 tablet Oral Q4H PRN [place], Jaimie A, DO   1 tablet at 07/01/20 1543  .  phosphorus (K PHOS NEUTRAL) tablet 500 mg  500 mg Oral Daily [place], Carley D, DO   500 mg at 07/01/20 0958  . predniSONE (DELTASONE) tablet 40 mg  40 mg Oral Q breakfast [place], Carley D, DO   40 mg at 07/01/20 1188     Discharge Medications: Please see discharge summary for a list of discharge medications.  Relevant Imaging Results:  Relevant Lab Results:   Additional Information [id number] [place], [place]

## 2020-07-01 NOTE — Progress Notes (Signed)
Subjective:   No acute events overnight. Evaluated at bedside this morning. Reports that she sat in her chair for 7 hours yesterday. Reports she slept well last night and has already been up and out of bed several times this morning because she likes to stay active. Patient states she is going to quit smoking. Reports she quit smoking years ago but has been smoking the last 30 years. Knows she always likes to combine smoking and coffee or smoking and a beer. Discussed her having physical therapy at home. She reports her nurse aide and neighbor do physical therapy with her. Thinks she is going to stay in town for the next month for her vacation. Discussed therapist being able to visit her relative's home in [place]. Gets wound care supplies in the mail. Says she can't afford the large bandages she has been using, but can afford the gauze she is being recommended to use. Plan to have her see our clinic for a hospital follow.    Objective:  Vital signs in last 24 hours: Vitals:   06/30/20 2100 07/01/20 0000 07/01/20 0400 07/01/20 0500  BP: 124/77 130/70 138/74   Pulse: 84 76 78   Resp: _0 Temp: 97.7 F (36.5 C) 97.9 F (36.6 C) 97.7 F (36.5 C)   TempSrc: Oral Oral Oral   SpO2:   95%   Weight:    45.4 kg  Height:       General:Cachectic and thinelderlywomanlaying in bed comfortably on 2LNC. No acute distress. Speaking in full sentences. Head:[place]/AT HEENT:PERRLA. Poor dentition with missing teeth. CV:RRR. No m/r/g. No LE edema Pulmonary:Congested lung sounds on lung apexes. Mild expiratory wheezes on bilateral lung apexes. No increased WOB. No accesory muscle use. Abdomen:Soft, non-tender, non-distended. Normal Bowel sounds Extremities:Varicose veins. Faint dorsalis pedis pulse bilaterally. 2+ BUE pulses. Skin:Chronic, erythematous wounds with granulation tissue and mild exudate on bilateral ankles, (See picture on H&P). Decreased skin turgor on extremities.    Neuro:A&O x3. Normal sensation. Moves all extremities. Psych:Normal mood and affect  Assessment/Plan:  Active Problems:   COPD exacerbation (HCC)   Hypoxemia   Protein-calorie malnutrition ([place])   Hyponatremia  Kristen [name] is a 74 yo w/ a significant PMH of COPD, current smoker, chronic venous stasis and chronic cellulitis who presented with worsening SOB and cough secondary to COPD exacerbation. Respiratory status improved s/p steroids and nebulized treatment.Weaning off O2.   #Acute hypoxic respiratory failure secondary to COPD exacerbation Current smoker with a history of COPD exacerbations, unvaccinated for COVID-19presented with increased shortness of breath and found to be hypoxic with O2 sats of 79% on room air on admission. CXR shows small bilateral pleural effusion andbilateral interstitial prominence. Covid-19 neg, but RVP positive for coronavirus. Patient has made steady improvement in respiratory status and oxygen requirement each day. Currently on [facility] and satting around 90-94%. Plan to wean off as tolerated. --Prednisone and azithromycin x5 days (On day 3) --Continue Duoneb nebulizer treatment q6hrs --Dulera inhaler I puff BID --wean O2 as able with goal O2 sat > 88% --Supportive care with Mucinex  --Incentive Spirometry  --continue PT/OT efforts   #Chronic venous insufficiency w/ ulcerative wounds  Wounds appear to have healthy tissue; no evidence of infection --Continue wound care --Wound care consulted, appreciate recs  #Malnutrition #Pulmonary cachexia  Albumin of2.9 on admission, now 2.3. Patient is cachetic and thin on exam. BMI of 13.92 kg/m. Reports decreased caloric intake and significant weight loss.  --Continue supplemental meals --monitor electrolytes and  replete as indicated   #Smoker Significant history of smoking. Interested in quitting. --Continue nicotine patch  Prior to Admission Living Arrangement: home Anticipated Discharge  Location: home  Barriers to Discharge: continued improvement in respiratory status  Dispo: Anticipated discharge in approximately 1-2 day(s).   Kristen Axon, MD 07/01/2020, 6:15 AM Pager: [phone] Internal Medicine Teaching Service After 5pm on weekdays and 1pm on weekends: On Call pager [phone]

## 2020-07-01 NOTE — Social Work (Cosign Needed)
To Whom it May Concern, Please be advised that the above named patient will require a short term nursing home stay. Anticipated to be 30 days or less for rehabilitation and strengthening.  Patient here visiting family in [place], the plan is for return home to [place].    Kristen [name] January 08, 1946

## 2020-07-01 NOTE — TOC Initial Note (Signed)
Transition of Care [facility]) - Initial/Assessment Note    Patient Details  Name: Kristen [name] MRN: [id number] Date of Birth: 1945/12/01  Transition of [facility]) CM/SW Contact:    Jacquelynn Cree Phone Number: 07/01/2020, 5:01 PM  Clinical Narrative:                 CSW met with patient to discuss PT recommendation for short-term SNF placement at discharge. Patient stated PTA, she was completely independent, still driving and performing her own ADLs.   Patient expressed understanding and stated she would like to go to her sister's house with Home Health at discharge. She stated her sister is older but she she grandchildren who live in the home and on the same street. She stated someone is home all day, everyday. Patient provided permission for her sister [name] to be contacted [phone].   Expected Discharge Plan: [place] Barriers to Discharge: Continued Medical Work up   Patient Goals and CMS Choice   CMS Medicare.gov Compare Post Acute Care list provided to:: Patient Choice offered to / list presented to : Patient  Expected Discharge Plan and Services Expected Discharge Plan: [place]       Living arrangements for the past 2 months: Single Family Home                                      Prior Living Arrangements/Services Living arrangements for the past 2 months: Single Family Home Lives with:: Self Patient language and need for interpreter reviewed:: Yes Do you feel safe going back to the place where you live?: Yes      Need for Family Participation in Patient Care: Yes (Comment) Care giver support system in place?: Yes (comment)   Criminal Activity/Legal Involvement Pertinent to Current Situation/Hospitalization: No - Comment as needed  Activities of Daily Living Home Assistive Devices/Equipment: Eyeglasses, Oxygen ADL Screening (condition at time of admission) Patient's cognitive ability adequate to safely  complete daily activities?: Yes Is the patient deaf or have difficulty hearing?: No Does the patient have difficulty seeing, even when wearing glasses/contacts?: No Does the patient have difficulty concentrating, remembering, or making decisions?: No Patient able to express need for assistance with ADLs?: Yes Does the patient have difficulty dressing or bathing?: No Independently performs ADLs?: Yes (appropriate for developmental age) Does the patient have difficulty walking or climbing stairs?: Yes Weakness of Legs: Both Weakness of Arms/Hands: Both  Permission Sought/Granted Permission sought to share information with : Facility Sport and exercise psychologist, Family Supports Permission granted to share information with : Yes, Verbal Permission Granted  Share Information with NAME: [name]  Permission granted to share info w AGENCY: [place] granted to share info w Relationship: Sister  Permission granted to share info w Contact Information: [phone]  Emotional Assessment Appearance:: Appears stated age Attitude/Demeanor/Rapport: Engaged Affect (typically observed): Appropriate Orientation: : Oriented to Self, Oriented to Place, Oriented to  Time, Oriented to Situation Alcohol / Substance Use: Not Applicable Psych Involvement: No (comment)  Admission diagnosis:  COPD exacerbation ([place]) [J44.1] Patient Active Problem List   Diagnosis Date Noted   Acute respiratory failure with hypercapnia ([place]) 07/01/2020   COPD exacerbation ([place]) 06/28/2020   Hypoxemia 06/28/2020   Protein-calorie malnutrition ([place]) 06/28/2020   Hyponatremia 06/28/2020   PCP:  Patient, No Pcp Per Pharmacy:   [id number] [place] [address] [place]  [address] Phone: [phone] Fax: [phone]  [place], [place] [address] [place]   [address] Phone: [phone] Fax: [phone]     Social Determinants of Health (SDOH) Interventions    Readmission Risk Interventions No flowsheet data found.

## 2020-07-02 LAB — BASIC METABOLIC PANEL
Anion gap: 8 (ref 5–15)
BUN: 14 mg/dL (ref 8–23)
CO2: 34 mmol/L — ABNORMAL HIGH (ref 22–32)
Calcium: 8.9 mg/dL (ref 8.9–10.3)
Chloride: 86 mmol/L — ABNORMAL LOW (ref 98–111)
Creatinine, Ser: 0.61 mg/dL (ref 0.44–1.00)
GFR calc Af Amer: >60 mL/min (ref >60)
GFR calc non Af Amer: >60 mL/min (ref >60)
Glucose, Bld: 113 mg/dL — ABNORMAL HIGH (ref 70–99)
Potassium: 4.2 mmol/L (ref 3.5–5.1)
Sodium: 128 mmol/L — ABNORMAL LOW (ref 135–145)

## 2020-07-02 LAB — CBC
HCT: 34.5 % — ABNORMAL LOW (ref 36.0–46.0)
Hemoglobin: 11 g/dL — ABNORMAL LOW (ref 12.0–15.0)
MCH: 29.6 pg (ref 26.0–34.0)
MCHC: 31.9 g/dL (ref 30.0–36.0)
MCV: 93 fL (ref 80.0–100.0)
Platelets: 380 10*3/uL (ref 150–400)
RBC: 3.71 MIL/uL — ABNORMAL LOW (ref 3.87–5.11)
RDW: 13 % (ref 11.5–15.5)
WBC: 10.1 10*3/uL (ref 4.0–10.5)
nRBC: 0 % (ref 0.0–0.2)

## 2020-07-02 LAB — LACTIC ACID, PLASMA: Lactic Acid, Venous: 1.2 mmol/L (ref 0.5–1.9)

## 2020-07-02 MED ORDER — SENNA 8.6 MG PO TABS
2.0000 | ORAL_TABLET | Freq: Two times a day (BID) | ORAL | Status: DC
  Administered 2020-07-02 – 2020-07-03 (×3): 17.2 mg via ORAL
  Filled 2020-07-02 (×3): qty 2

## 2020-07-02 NOTE — Progress Notes (Signed)
Physical Therapy Treatment Patient Details Name: Chan Rosasco MRN: [id number] DOB: [birth year] Today's Date: 07/02/2020    History of Present Illness Patient is a 74 year old female with PMH of COPD, chronic venous stasis and chronic cellulitis presenting with a complaint of shortness of breath, cough and chest pain.  Patient states he has had shortness of breath for past 2 to 3 days. Pt lives in [place], visiting family. Pt admitted for further workup.    PT Comments    Pt admitted with above diagnosis. Pt was able to ambulate with min guard assist to min assist in hallway increased distance.  Pt does desat on RA therefore needs 3LO2 at rest and with activity. Pt currently with functional limitations due to balance and endurance deficits. Pt will benefit from skilled PT to increase their independence and safety with mobility to allow discharge to the venue listed below.     Follow Up Recommendations  SNF;Supervision/Assistance - 24 hour     Equipment Recommendations  Rolling walker with 5" wheels    Recommendations for Other Services       Precautions / Restrictions Precautions Precautions: Fall Precaution Comments: monitor O2 Restrictions Weight Bearing Restrictions: No    Mobility  Bed Mobility Overal bed mobility: Needs Assistance Bed Mobility: Supine to Sit;Sit to Supine     Supine to sit: Supervision;HOB elevated Sit to supine: Supervision;HOB elevated      Transfers Overall transfer level: Needs assistance Equipment used: None Transfers: Sit to/from Stand Sit to Stand: Min guard         General transfer comment: No steaydiing assist needed.   Ambulation/Gait Ambulation/Gait assistance: Min guard Gait Distance (Feet): 150 Feet Assistive device: None Gait Pattern/deviations: Step-through pattern;Decreased stride length Gait velocity: decreased Gait velocity interpretation: <1.31 ft/sec, indicative of household ambulator General Gait Details: Pt was  able to ambulate without device however pt kept wanting to put her arm around PT for stability. told pt that she should probably use RW at home.  Pt appears to have overall good balance but difficult to assess due to pt kept placing arm around therapist.    Stairs             Wheelchair Mobility    Modified Rankin (Stroke Patients Only)       Balance Overall balance assessment: Needs assistance Sitting-balance support: Feet supported;No upper extremity supported Sitting balance-Leahy Scale: Good     Standing balance support: During functional activity;No upper extremity supported Standing balance-Leahy Scale: Fair Standing balance comment: Pt kept holding onto PT at times during the walk dynamically. Holds furniture when standing statically.                             Cognition Arousal/Alertness: Awake/alert Behavior During Therapy: Anxious;Impulsive Overall Cognitive Status: No family/caregiver present to determine baseline cognitive functioning                                        Exercises      General Comments General comments (skin integrity, edema, etc.): Pt on 3L on arrival.  Desat to 86% on RA therefore replaced at 3L and maintains sats >90% on 3L.        Pertinent Vitals/Pain Pain Assessment: No/denies pain    Home Living  Prior Function            PT Goals (current goals can now be found in the care plan section) Acute Rehab PT Goals Patient Stated Goal: go home, be able to eat Progress towards PT goals: Progressing toward goals    Frequency    Min 2X/week      PT Plan Current plan remains appropriate    Co-evaluation              AM-PAC PT "6 Clicks" Mobility   Outcome Measure  Help needed turning from your back to your side while in a flat bed without using bedrails?: A Little Help needed moving from lying on your back to sitting on the side of a flat bed without using  bedrails?: A Little Help needed moving to and from a bed to a chair (including a wheelchair)?: A Little Help needed standing up from a chair using your arms (e.g., wheelchair or bedside chair)?: A Little Help needed to walk in hospital room?: A Little Help needed climbing 3-5 steps with a railing? : A Lot 6 Click Score: 17    End of Session Equipment Utilized During Treatment: Gait belt;Oxygen Activity Tolerance: Patient tolerated treatment well Patient left: with call bell/phone within reach;in bed;with bed alarm set Nurse Communication: Mobility status PT Visit Diagnosis: Unsteadiness on feet (R26.81);Other abnormalities of gait and mobility (R26.89)     Time: 0630-1601 PT Time Calculation (min) (ACUTE ONLY): 14 min  Charges:  $Gait Training: 8-22 mins                     Huey Scalia W,PT [place] Pager:  [phone]  Office:  [place] 07/02/2020, 1:15 PM

## 2020-07-02 NOTE — Plan of Care (Signed)

## 2020-07-02 NOTE — TOC Progression Note (Signed)
Transition of Care [facility]) - Progression Note    Patient Details  Name: Kristen [name] MRN: [id number] Date of Birth: July 02, 1946  Transition of Care [facility]) CM/SW Contact  Carles Collet, RN Phone Number: 07/02/2020, 12:08 PM  Clinical Narrative:    Damaris Schooner w patient at bedside.  She would like [facility] services through [facility], referral accepted. Will continue to follow for DME and O2 needs.    Expected Discharge Plan: [place] Barriers to Discharge: Continued Medical Work up  Expected Discharge Plan and Services Expected Discharge Plan: [place] arrangements for the past 2 months: Single Family Home                           HH Arranged: PT, OT [facility] Agency: [facility] Date [place]: 07/02/20 Time [place]: 1208 Representative spoke with at [place]: cory   Social Determinants of Health ([place]) Interventions    Readmission Risk Interventions No flowsheet data found.

## 2020-07-02 NOTE — Plan of Care (Signed)
  Problem: Education: Goal: Knowledge of disease or condition will improve Outcome: Progressing   Problem: Respiratory: Goal: Ability to maintain a clear airway will improve Outcome: Progressing   Problem: Respiratory: Goal: Levels of oxygenation will improve Outcome: Progressing   Problem: Respiratory: Goal: Ability to maintain adequate ventilation will improve Outcome: Progressing

## 2020-07-02 NOTE — Progress Notes (Signed)
Subjective:   No acute events overnight. Kristen [name] states she did not sleep well last night. Her breathing is better but she has had "bowel issues". States she has been constipated and the senokot helped her move her bowel. She was informed that the team was ready to discharge her, but she requested to stay an extra day so she can get her bowel together before leaving the hospital. Team asked patient to try to eat today and hopefully move her bowel. We informed her of plans to discharge her tomorrow on oxygen and with home health. Patient was concerned about her ability to get her meds and she was reassured that we will have her follow up with our internal medicine clinic and get her meds refilled.     Objective:  Vital signs in last 24 hours: Vitals:   07/01/20 1941 07/02/20 0005 07/02/20 0106 07/02/20 0400  BP:  132/76  (!) 155/92  Pulse:  81  81  Resp:  20    Temp:  97.8 F (36.6 C)  98.2 F (36.8 C)  TempSrc:  Oral  Oral  SpO2: 92%   98%  Weight:   44.6 kg   Height:       General:Cachectic, thin, and quite talkative elderlywomanlaying in bed comfortably on 2LNC. No acute distress. Quite talkative. Sat dropped to 88% at rest when temporary taken off O2. Head:[place]/AT HEENT:PERRLA. Poor dentition with missing teeth. CV:RRR. No m/r/g. No LE edema Pulmonary:Decreased lung sounds throughout. No increased WOB. No accesory muscle use. Abdomen:Soft, non-tender, non-distended. Normal Bowel sounds Extremities:Varicose veins. Faint dorsalis pedis pulse bilaterally. 2+ BUE pulses. Skin:Chronic, erythematous wounds with granulation tissue and mild exudate on bilateral ankles, (See picture on H&P). Decreased skin turgor on extremities.  Neuro:A&O x3. Normal sensation. Moves all extremities. Psych:Normal mood and affect  Assessment/Plan:  Principal Problem:   COPD exacerbation (HCC) Active Problems:   Hypoxemia   Protein-calorie malnutrition ([place])   Hyponatremia   Acute  respiratory failure with hypercapnia ([place])  Kristen [name] is a 74 yo w/ a significant PMH of COPD, current smoker, chronic venous stasis and chronic cellulitis who presented with worsening SOB and cough secondary to COPD exacerbation. Respiratory status improved s/p steroids and nebulized treatment.Requiring supplemental O2 to maintain sats above 88% at rest.   #Acute hypoxic respiratory failure secondary to COPD exacerbation Current smoker with a history of COPD exacerbations, unvaccinated for COVID-19presented with increased shortness of breath and found to be hypoxic with O2 sats of 79% on room air on admission. CXR shows small bilateral pleural effusion andbilateral interstitial prominence. Covid-19 neg, but RVP positive for coronavirus. Patient has made steady improvement in respiratory status and oxygen requirement each day. On 3LNC and satting around 90-94%. Patient was taken off O2 at rest and O2 sats dropped to 88%. He placed back on 2L and sats improved to 90-93%. --Continue prednisone 40 mg daily for 5 days (Day 4 of 5) --Completed azithromycin today (day 4 of 4) --Continue Duoneb nebulizer treatment q6hrs --Dulera inhaler I puff BID --Wean O2 as able with goal O2 sat > 88% --Supportive care with Mucinex  --Incentive Spirometry   --continue PT/OT efforts   #Chronic venous insufficiency w/ ulcerative wounds  Wounds appear to have healthy tissue; no evidence of infection --Wound care consulted, appreciate recs --Referred to home health w/ wound care s/p discharge  #Malnutrition #Pulmonary cachexia  Albumin of2.9 on admission, now 2.3. Patient is cachetic and thin on exam. BMI of 13.92 kg/m2. Has gained 1.3  Lbs while in the hospital. Reports decreased caloric intake and significant weight loss. Unlikely to have significant improvements considering her high catabolic needs 2/2 to her emphysema.  --Continue supplemental meals --monitor electrolytes and replete as indicated    #Smoker Significant history of smoking. Interested in quitting. Plans to quick on her own. --Continue nicotine patch  Prior to Admission Living Arrangement: Home Anticipated Discharge Location: Home w/ home health Barriers to Discharge: Continued improvement in respiratory status  Dispo: Anticipated discharge in approximately 1-2 day(s).   Lacinda Axon, MD 07/02/2020, 6:33 AM Pager: [phone] Internal Medicine Teaching Service After 5pm on weekdays and 1pm on weekends: On Call pager [phone]

## 2020-07-03 MED ORDER — MOMETASONE FURO-FORMOTEROL FUM 200-5 MCG/ACT IN AERO: 1.0000 | INHALATION_SPRAY | Freq: Two times a day (BID) | RESPIRATORY_TRACT | 0 refills | Status: DC

## 2020-07-03 MED ORDER — NICOTINE 21 MG/24HR TD PT24: 21.0000 mg | MEDICATED_PATCH | Freq: Every day | TRANSDERMAL | 0 refills | Status: DC

## 2020-07-03 MED ORDER — GUAIFENESIN ER 600 MG PO TB12: 600.0000 mg | ORAL_TABLET | Freq: Two times a day (BID) | ORAL | 0 refills | Status: DC

## 2020-07-03 MED ORDER — POTASSIUM CHLORIDE ER 10 MEQ PO TBCR: 10.0000 meq | EXTENDED_RELEASE_TABLET | Freq: Every day | ORAL | 0 refills | Status: DC

## 2020-07-03 MED ORDER — ALBUTEROL SULFATE HFA 108 (90 BASE) MCG/ACT IN AERS: 2.0000 | INHALATION_SPRAY | Freq: Four times a day (QID) | RESPIRATORY_TRACT | 1 refills | Status: DC | PRN

## 2020-07-03 MED ORDER — HYDROCODONE-ACETAMINOPHEN 10-325 MG PO TABS: 1.0000 | ORAL_TABLET | Freq: Three times a day (TID) | ORAL | 0 refills | Status: DC | PRN

## 2020-07-03 MED ORDER — HYDROCODONE-ACETAMINOPHEN 10-325 MG PO TABS: 1.0000 | ORAL_TABLET | Freq: Three times a day (TID) | ORAL | 0 refills | Status: AC | PRN

## 2020-07-03 NOTE — Plan of Care (Signed)
Problem: Education: Goal: Knowledge of disease or condition will improve Outcome: Adequate for Discharge Goal: Knowledge of the prescribed therapeutic regimen will improve Outcome: Adequate for Discharge Goal: Individualized Educational Video(s) Outcome: Adequate for Discharge   Problem: Activity: Goal: Ability to tolerate increased activity will improve Outcome: Adequate for Discharge Goal: Will verbalize the importance of balancing activity with adequate rest periods Outcome: Adequate for Discharge   Problem: Respiratory: Goal: Ability to maintain a clear airway will improve Outcome: Adequate for Discharge Goal: Levels of oxygenation will improve Outcome: Adequate for Discharge Goal: Ability to maintain adequate ventilation will improve Outcome: Adequate for Discharge   Problem: Education: Goal: Knowledge of General Education information will improve Description: Including pain rating scale, medication(s)/side effects and non-pharmacologic comfort measures Outcome: Adequate for Discharge   Problem: Health Behavior/Discharge Planning: Goal: Ability to manage health-related needs will improve Outcome: Adequate for Discharge   Problem: Clinical Measurements: Goal: Ability to maintain clinical measurements within normal limits will improve Outcome: Adequate for Discharge Goal: Will remain free from infection Outcome: Adequate for Discharge Goal: Diagnostic test results will improve Outcome: Adequate for Discharge Goal: Respiratory complications will improve Outcome: Adequate for Discharge Goal: Cardiovascular complication will be avoided Outcome: Adequate for Discharge   Problem: Activity: Goal: Risk for activity intolerance will decrease Outcome: Adequate for Discharge   Problem: Nutrition: Goal: Adequate nutrition will be maintained Outcome: Adequate for Discharge   Problem: Coping: Goal: Level of anxiety will decrease Outcome: Adequate for Discharge    Problem: Elimination: Goal: Will not experience complications related to bowel motility Outcome: Adequate for Discharge Goal: Will not experience complications related to urinary retention Outcome: Adequate for Discharge   Problem: Pain Managment: Goal: General experience of comfort will improve Outcome: Adequate for Discharge   Problem: Safety: Goal: Ability to remain free from injury will improve Outcome: Adequate for Discharge   Problem: Skin Integrity: Goal: Risk for impaired skin integrity will decrease Outcome: Adequate for Discharge

## 2020-07-03 NOTE — Progress Notes (Signed)
Obtained script for Norco and given to pt. D/C instructions given and reviewed. No questions voiced at this time. Encouraged to call with any questions/concerns. Tele and IV removed. Tolerated well. Home O2 at bedside. Pt attempting to call her friend for transport.

## 2020-07-03 NOTE — Progress Notes (Signed)
Attempted to review D/C instructions with pt but pt became irate and refused to discuss anything about D/C once she became aware that she was not ordered anything "strong enough" for her leg pain. This nurse assured her I would notify MD of her request. Stated she takes perocoet at home though this was not noted on her home med list.

## 2020-07-03 NOTE — Progress Notes (Signed)
Subjective:   No acute events overnight. Patient has a hospital follow up appt with [facility] next week.  This AM, Kristen [name] was evaluated at the bedside. She had a list of questions and topics she wanted to address and we addressed them one by one. We informed her that we will refill all her medications. She states the Spiriva does not do anything for her so we switched her to [facility]. We informed her that we will have her oxygen ready on discharge. She informed the team that she is feeling better and moved her bowel last night and this morning so she is ready to go.    Objective:  Vital signs in last 24 hours: Vitals:   07/02/20 1916 07/03/20 0020 07/03/20 0159 07/03/20 0522  BP: 132/82 123/67  (!) 147/78  Pulse: 82 74  77  Resp: _0 Temp: 97.7 F (36.5 C) 98.7 F (37.1 C)  98.2 F (36.8 C)  TempSrc: Oral Oral  Oral  SpO2: 98%   91%  Weight:   42.6 kg   Height:       General:Cachectic and thinelderlywomanlaying in bed comfortably on 3LNC. No acute distress. Speaking in full sentences. Has a list of questions for the team. Head:[place]/AT HEENT:PERRLA. Poor dentition with missing teeth. CV:RRR. No m/r/g. No LE edema Pulmonary:Still congested lung lungs, improving. No wheezes or crackles. No increased WOB. No accesory muscle use. Abdomen:Soft, non-tender, non-distended. Normal Bowel sounds Extremities:Varicose veins. Faint dorsalis pedis pulse bilaterally. 2+ BUE pulses. Skin:Chronic, erythematous wounds with granulation tissue and mild exudate on bilateral ankles wrapped in bandage (See picture on H&P). Decreased skin turgor on extremities.  Neuro:A&O x3. Normal sensation. Moves all extremities. Psych:Normal mood and affect  Assessment/Plan:  Principal Problem:   COPD exacerbation (HCC) Active Problems:   Hypoxemia   Protein-calorie malnutrition ([place])   Hyponatremia   Acute respiratory failure with hypercapnia ([place])  Ms. Stallone is a 74 yo w/ a significant  PMH of COPD, current smoker, chronic venous stasis and chronic cellulitis who presented with worsening SOB and cough secondary to COPD exacerbation. Respiratory status improved s/p steroids and nebulized treatment.Ready for discharge today.  #Acute hypoxic respiratory failure secondary to COPD exacerbation Current smoker with a history of COPD exacerbations, unvaccinated for COVID-19presented with increased shortness of breath and found to be hypoxic with O2 sats of 79% on room air on admission. CXR shows small bilateral pleural effusion andbilateral interstitial prominence. Covid-19 neg, but RVP positive for coronavirus. Patient has made steady improvement in respiratory status and oxygen requirement each day. Patient was taken off O2 at rest and O2 sats dropped to 88% yesterday. He was placed back on 2L and sats improved to 90-93%. Today, she is satting above 90% on 3LNC. Ready for discharge. Changed her inhaler from Spiriva to [facility] as patient states she does not like it.  --Completed course of prednisone (x5days) and azithromycin (x4days) --Start Dulera inhaler, discontinue home Spiriva --Refilled Albuterol inhaler --Ordered home oxygen --Continue Mucinex at home --Home health w/ PT  #Chronic venous insufficiency w/ ulcerative wounds  Wounds appear to have healthy tissue; no evidence of infection --Continue wound care with home health nurse --Refilled Norco (4 weeks prescription) for pain  #Malnutrition #Pulmonary cachexia  Albumin of2.9 on admission, now 2.3. Patient is cachetic and thin on exam. BMI of 13.92 kg/m. Reported decreased caloric intake and significant weight loss. Weight did not improve during admission. Encouraged increase caloric intake at home.   #Smoker Significant history  of smoking. Interested in quitting. --Prescribed nicotine patch  Prior to Admission Living Arrangement: home Anticipated Discharge Location: home  Barriers to Discharge: None Dispo:  Anticipated discharge today   Lacinda Axon, MD 07/03/2020, 6:09 AM Pager: [phone] Internal Medicine Teaching Service After 5pm on weekdays and 1pm on weekends: On Call pager [phone]

## 2020-07-03 NOTE — TOC Transition Note (Signed)
Transition of Care [facility]) - CM/SW Discharge Note   Patient Details  Name: Kristen [name] MRN: [id number] Date of Birth: [birth year]  [facility]) CM/SW Contact:  Carles Collet, RN Phone Number: 07/03/2020, 11:47 AM   Clinical Narrative:    [name] notified of DC, Oxygen has been delivered to room. Patient denies other dme needs. Family will provide transport.  verified that [facility] [place]   Final next level of care: [place] Barriers to Discharge: No Barriers Identified   Patient Goals and CMS Choice Patient states their goals for this hospitalization and ongoing recovery are:: to go home CMS Medicare.gov Compare Post Acute Care list provided to:: Patient Choice offered to / list presented to : Patient  Discharge Placement                       Discharge Plan and Services                DME Arranged: Oxygen DME Agency: [facility] Date DME Agency Contacted: 07/03/20 Time DME Agency Contacted: 48 Representative spoke with at DME Agency: zach HH Arranged: PT, OT HH Agency: [place] Date [place]: 07/02/20 Time [place]: 1208 Representative spoke with at [place]: cory  Social Determinants of Health ([place]) Interventions     Readmission Risk Interventions No flowsheet data found.

## 2020-07-03 NOTE — Discharge Instructions (Signed)
Kristen [name],   It was a pleasure to take of you during your hospital stay. Your breathing has improved while at the hospital but we want you to start using oxygen at home because your COPD has gotten worse. Continue taking care of your wound as you have previously done. Please go to your hospital follow up appointment at our Internal medicine clinic here at the hospital next week on August 17th so they can check a few labs and see how you are doing.   Thanks, Dr. Coy Saunas  Chronic Obstructive Pulmonary Disease Exacerbation  Chronic obstructive pulmonary disease (COPD) is a long-term (chronic) condition that affects the lungs. COPD is a general term that can be used to describe many different lung problems that cause lung swelling (inflammation) and limit airflow, including chronic bronchitis and emphysema. COPD exacerbations are episodes when breathing symptoms become much worse and require extra treatment. COPD exacerbations are usually caused by infections. Without treatment, COPD exacerbations can be severe and even life threatening. Frequent COPD exacerbations can cause further damage to the lungs. What are the causes? This condition may be caused by:  Respiratory infections, including viral and bacterial infections.  Exposure to smoke.  Exposure to air pollution, chemical fumes, or dust.  Things that give you an allergic reaction (allergens).  Not taking your usual COPD medicines as directed.  Underlying medical problems, such as congestive heart failure or infections not involving the lungs. In many cases, the cause (trigger) of this condition is not known. What increases the risk? The following factors may make you more likely to develop this condition:  Smoking cigarettes.  Old age.  Frequent prior COPD exacerbations. What are the signs or symptoms? Symptoms of this condition include:  Increased coughing.  Increased production of mucus from your lungs  (sputum).  Increased wheezing.  Increased shortness of breath.  Rapid or labored breathing.  Chest tightness.  Less energy than usual.  Sleep disruption from symptoms.  Confusion or increased sleepiness. Often these symptoms happen or get worse even with the use of medicines. How is this diagnosed? This condition is diagnosed based on:  Your medical history.  A physical exam. You may also have tests, including:  A chest X-ray.  Blood tests.  Lung (pulmonary) function tests. How is this treated? Treatment for this condition depends on the severity and cause of the symptoms. You may need to be admitted to a hospital for treatment. Some of the treatments commonly used to treat COPD exacerbations are:  Antibiotic medicines. These may be used for severe exacerbations caused by a lung infection, such as pneumonia.  Bronchodilators. These are inhaled medicines that expand the air passages and allow increased airflow.  Steroid medicines. These act to reduce inflammation in the airways. They may be given with an inhaler, taken by mouth, or given through an IV tube inserted into one of your veins.  Supplemental oxygen therapy.  Airway clearing techniques, such as noninvasive ventilation (NIV) and positive expiratory pressure (PEP). These provide respiratory support through a mask or other noninvasive device. An example of this would be using a continuous positive airway pressure (CPAP) machine to improve delivery of oxygen into your lungs. Follow these instructions at home: Medicines  Take over-the-counter and prescription medicines only as told by your health care provider. It is important to use correct technique with inhaled medicines.  If you were prescribed an antibiotic medicine or oral steroid, take it as told by your health care provider. Do not stop taking the  medicine even if you start to feel better. Lifestyle  Eat a healthy diet.  Exercise regularly.  Get plenty  of sleep.  Avoid exposure to all substances that irritate the airway, especially to tobacco smoke.  Wash your hands often with soap and water to reduce the risk of infection. If soap and water are not available, use hand sanitizer.  During flu season, avoid enclosed spaces that are crowded with people. General instructions  Drink enough fluid to keep your urine clear or pale yellow (unless you have a medical condition that requires fluid restriction).  Use a cool mist vaporizer. This humidifies the air and makes it easier for you to clear your chest when you cough.  If you have a home nebulizer and oxygen, continue to use them as told by your health care provider.  Keep all follow-up visits as told by your health care provider. This is important. How is this prevented?  Stay up-to-date on pneumococcal and influenza (flu) vaccines. A flu shot is recommended every year to help prevent exacerbations.  Do not use any products that contain nicotine or tobacco, such as cigarettes and e-cigarettes. Quitting smoking is very important in preventing COPD from getting worse and in preventing exacerbations from happening as often. If you need help quitting, ask your health care provider.  Follow all instructions for pulmonary rehabilitation after a recent exacerbation. This can help prevent future exacerbations.  Work with your health care provider to develop and follow an action plan. This tells you what steps to take when you experience certain symptoms. Contact a health care provider if:  You have a worsening of your regular COPD symptoms. Get help right away if:  You have worsening shortness of breath, even when resting.  You have trouble talking.  You have severe chest pain.  You cough up blood.  You have a fever.  You have weakness, vomit repeatedly, or faint.  You feel confused.  You are not able to sleep because of your symptoms.  You have trouble doing daily  activities. Summary  COPD exacerbations are episodes when breathing symptoms become much worse and require extra treatment above your normal treatment.  Exacerbations can be severe and even life threatening. Frequent COPD exacerbations can cause further damage to your lungs.  COPD exacerbations are usually triggered by infections such as the flu, colds, and even pneumonia.  Treatment for this condition depends on the severity and cause of the symptoms. You may need to be admitted to a hospital for treatment.  Quitting smoking is very important to prevent COPD from getting worse and to prevent exacerbations from happening as often. This information is not intended to replace advice given to you by your health care provider. Make sure you discuss any questions you have with your health care provider. Document Revised: 10/22/2017 Document Reviewed: 12/14/2016 Elsevier Patient Education  2020 [employer].

## 2020-07-09 ENCOUNTER — Encounter: Payer: Medicare Other | Admitting: Internal Medicine

## 2020-07-10 ENCOUNTER — Encounter: Payer: Medicare Other | Admitting: Internal Medicine

## 2020-07-13 ENCOUNTER — Inpatient Hospital Stay
Admission: EM | Admit: 2020-07-13 | Discharge: 2020-07-15 | Disposition: A | Discharge status: Home / Self Care | Payer: Medicare Other | Admitting: Internal Medicine

## 2020-07-13 ENCOUNTER — Emergency Department: Admit: 2020-07-13 | Payer: MEDICARE | Primary: Family Medicine

## 2020-07-13 ENCOUNTER — Observation Stay: Admit: 2020-07-13 | Payer: MEDICARE | Primary: Family Medicine

## 2020-07-13 DIAGNOSIS — N179 Acute kidney failure, unspecified: Principal | ICD-10-CM

## 2020-07-13 LAB — LIPASE: Lipase: 37 U/L (ref 13–60)

## 2020-07-13 LAB — URINALYSIS
Bilirubin Urine: NEGATIVE
Blood, Urine: NEGATIVE
Glucose, Ur: NEGATIVE mg/dL
Ketones, Urine: NEGATIVE mg/dL
Nitrite, Urine: NEGATIVE
Protein, UA: NEGATIVE mg/dL
Specific Gravity, UA: 1.02 (ref 1.005–1.030)
Urobilinogen, Urine: 0.2 E.U./dL (ref <2.0)
pH, UA: 5 (ref 5.0–9.0)

## 2020-07-13 LAB — COMPREHENSIVE METABOLIC PANEL W/ REFLEX TO MG FOR LOW K
ALT: 14 U/L (ref 0–32)
AST: 21 U/L (ref 0–31)
Albumin: 3.1 g/dL — ABNORMAL LOW (ref 3.5–5.2)
Alkaline Phosphatase: 52 U/L (ref 35–104)
Anion Gap: 14 mmol/L (ref 7–16)
BUN: 39 mg/dL — ABNORMAL HIGH (ref 6–23)
CO2: 19 mmol/L — ABNORMAL LOW (ref 22–29)
Calcium: 7.2 mg/dL — ABNORMAL LOW (ref 8.6–10.2)
Chloride: 107 mmol/L (ref 98–107)
Creatinine: 1.4 mg/dL — ABNORMAL HIGH (ref 0.5–1.0)
GFR African American: 44
GFR Non-African American: 37 mL/min/{1.73_m2} (ref >=60)
Glucose: 132 mg/dL — ABNORMAL HIGH (ref 74–99)
Potassium reflex Magnesium: 2.9 mmol/L — ABNORMAL LOW (ref 3.5–5.0)
Sodium: 140 mmol/L (ref 132–146)
Total Bilirubin: 0.8 mg/dL (ref 0.0–1.2)
Total Protein: 5.4 g/dL — ABNORMAL LOW (ref 6.4–8.3)

## 2020-07-13 LAB — CBC WITH AUTO DIFFERENTIAL
Basophils %: 0.4 % (ref 0.0–2.0)
Basophils Absolute: 0 E9/L (ref 0.00–0.20)
Eosinophils %: 0.9 % (ref 0.0–6.0)
Eosinophils Absolute: 0.1 E9/L (ref 0.05–0.50)
Hematocrit: 51.1 % — ABNORMAL HIGH (ref 34.0–48.0)
Hemoglobin: 17.2 g/dL — ABNORMAL HIGH (ref 11.5–15.5)
Lymphocytes %: 12.1 % — ABNORMAL LOW (ref 20.0–42.0)
Lymphocytes Absolute: 1.34 E9/L — ABNORMAL LOW (ref 1.50–4.00)
MCH: 32.7 pg (ref 26.0–35.0)
MCHC: 33.7 % (ref 32.0–34.5)
MCV: 97.1 fL (ref 80.0–99.9)
MPV: 13.8 fL — ABNORMAL HIGH (ref 7.0–12.0)
Monocytes %: 5.2 % (ref 2.0–12.0)
Monocytes Absolute: 0.56 E9/L (ref 0.10–0.95)
Neutrophils %: 81.9 % — ABNORMAL HIGH (ref 43.0–80.0)
Neutrophils Absolute: 9.18 E9/L — ABNORMAL HIGH (ref 1.80–7.30)
Platelets: 149 E9/L (ref 130–450)
RBC: 5.26 E12/L (ref 3.50–5.50)
RDW: 12.6 fL (ref 11.5–15.0)
WBC: 11.2 E9/L (ref 4.5–11.5)

## 2020-07-13 LAB — BASIC METABOLIC PANEL
Anion Gap: 13 mmol/L (ref 7–16)
BUN: 42 mg/dL — ABNORMAL HIGH (ref 6–23)
CO2: 25 mmol/L (ref 22–29)
Calcium: 8.6 mg/dL (ref 8.6–10.2)
Chloride: 102 mmol/L (ref 98–107)
Creatinine: 1.6 mg/dL — ABNORMAL HIGH (ref 0.5–1.0)
GFR African American: 38
GFR Non-African American: 31 mL/min/{1.73_m2} (ref >=60)
Glucose: 100 mg/dL — ABNORMAL HIGH (ref 74–99)
Potassium: 3.4 mmol/L — ABNORMAL LOW (ref 3.5–5.0)
Sodium: 140 mmol/L (ref 132–146)

## 2020-07-13 LAB — MICROSCOPIC URINALYSIS

## 2020-07-13 LAB — EKG 12-LEAD
Atrial Rate: 88 {beats}/min
P Axis: 42 degrees
P-R Interval: 168 ms
Q-T Interval: 394 ms
QRS Duration: 80 ms
QTc Calculation (Bazett): 476 ms
R Axis: -27 degrees
T Axis: 38 degrees
Ventricular Rate: 88 {beats}/min

## 2020-07-13 LAB — MAGNESIUM: Magnesium: 1.6 mg/dL (ref 1.6–2.6)

## 2020-07-13 LAB — BRAIN NATRIURETIC PEPTIDE: Pro-BNP: 68 pg/mL (ref 0–450)

## 2020-07-13 LAB — SPECIMEN REJECTION

## 2020-07-13 LAB — TROPONIN: Troponin, High Sensitivity: 31 ng/L — ABNORMAL HIGH (ref 0–9)

## 2020-07-13 MED ORDER — SODIUM CHLORIDE 0.9 % IV SOLN
0.9 | INTRAVENOUS | Status: DC
Start: 2020-07-13 — End: 2020-07-13
  Administered 2020-07-13: 23:00:00 1000 mL via INTRAVENOUS

## 2020-07-13 MED ORDER — SODIUM CHLORIDE 0.9 % IV BOLUS
0.9 % | Freq: Once | INTRAVENOUS | Status: AC
Start: 2020-07-13 — End: 2020-07-13
  Administered 2020-07-13: 19:00:00 1000 mL via INTRAVENOUS

## 2020-07-13 MED ORDER — SODIUM CHLORIDE 0.9 % IV BOLUS
0.9 | Freq: Once | INTRAVENOUS | Status: AC
Start: 2020-07-13 — End: 2020-07-13
  Administered 2020-07-13: 20:00:00 1000 mL via INTRAVENOUS

## 2020-07-13 MED ORDER — HYDROCODONE-ACETAMINOPHEN 7.5-325 MG PO TABS
Freq: Once | ORAL | Status: AC
Start: 2020-07-13 — End: 2020-07-13
  Administered 2020-07-13: 23:00:00 1 via ORAL

## 2020-07-13 MED ORDER — MAGNESIUM SULFATE 2000 MG/50 ML IVPB PREMIX
2 GM/50ML | Freq: Once | INTRAVENOUS | Status: AC
Start: 2020-07-13 — End: 2020-07-13
  Administered 2020-07-13: 21:00:00 2000 mg via INTRAVENOUS

## 2020-07-13 MED ORDER — NICOTINE 21 MG/24HR TD PT24
21 MG/24HR | Freq: Every day | TRANSDERMAL | Status: DC
Start: 2020-07-13 — End: 2020-07-15
  Administered 2020-07-13 – 2020-07-15 (×3): 1 via TRANSDERMAL

## 2020-07-13 MED FILL — NICOTINE STEP 1 21 MG/24HR TD PT24: 21 mg/(24.h) | TRANSDERMAL | Qty: 1

## 2020-07-13 MED FILL — MAGNESIUM SULFATE 2 GM/50ML IV SOLN: 2 GM/50ML | INTRAVENOUS | Qty: 50

## 2020-07-13 MED FILL — HYDROCODONE-ACETAMINOPHEN 7.5-325 MG PO TABS: 7.5-325 mg | ORAL | Qty: 1

## 2020-07-13 NOTE — ED Notes (Signed)
Bed: 01  Expected date:   Expected time:   Means of arrival:   Comments:  emt     Verdis Frederickson, RN  07/13/20 1422

## 2020-07-13 NOTE — ED Notes (Signed)
Report called, husband aware of plan. Belongings with patient.     Elspeth Cho, RN  07/13/20 1942

## 2020-07-13 NOTE — H&P (Signed)
Department of Internal Medicine  History and Physical    PCP: Jacqulyn Liner, DO  Admitting Physician: Dr. Luan Pulling  Consultants:   Date of Service: 07/13/2020    CHIEF COMPLAINT:  Syncope and collapse    HISTORY OF PRESENT ILLNESS:    \\Patient is 74 year old female presented to the ED due to syncope and collapse.  Patient states that for the last day she has had decreased appetite and did not eat yesterday.  She did have 2 cups of coffee and a cup of green tea yesterday.  She did have something to drink earlier today.  Patient went shopping earlier today and towards the end of her shopping she began to feel diaphoretic and faint.  While walking she suddenly passed out.  She is unsure if she injured anything on the fall.  She is unsure trauma to her head.  However she does complain of left knee pain.  Otherwise she denies any shortness of breath or chest pain prior to passing out.  She denies any fever or chills.  She denies any nausea or emesis.  She does not complain of diarrhea.  She not complain of any urinary issues.    PAST MEDICAL Hx:  Past Medical History:   Diagnosis Date   ??? Hypertension        PAST SURGICAL Hx:   Past Surgical History:   Procedure Laterality Date   ??? BACK SURGERY  1990    L4-5   ??? KNEE SURGERY      x 3 Arthroscopy LEFT   ??? 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 TOTAL KNEE ARTHROPLASTY  2008    [place]   ??? TUBAL LIGATION         FAMILY Hx:  Family History   Problem Relation Age of Onset   ??? Cancer Unknown        HOME MEDICATIONS:  Prior to Admission medications    Medication Sig Start Date End Date Taking? Authorizing Provider   HYDROcodone-acetaminophen (NORCO) 7.5-325 MG per tablet Take 1 tablet by mouth every 8 hours as needed for Pain 11/05/14 12/05/14  Luevenia Maxin, APRN - CNP   ibuprofen (ADVIL;MOTRIN) 600 MG tablet Take 1 tablet by mouth every 8 hours as needed for Pain Take with food. 10/05/14 02/02/15  Stephannie Peters, PA   Psyllium-Calcium (METAMUCIL PLUS CALCIUM PO)  Take  by mouth.    Historical Provider, MD   losartan-hydrochlorothiazide (HYZAAR) 50-12.5 MG per tablet Take 1 tablet by mouth daily.    Historical Provider, MD   albuterol (PROVENTIL HFA) 108 (90 BASE) MCG/ACT inhaler Inhale 2 puffs into the lungs every 6 hours as needed for Wheezing for 7 days. 02/23/12 03/18/12  Hendricks Limes Scott, DO   OMEGA-3 KRILL OIL 300 MG CAPS Take 1 tablet by mouth daily.      Historical Provider, MD   Diclofenac Sodium (PENNSAID) 1.5 % SOLN Place 40 drops onto the skin 2 times daily as needed for 30 days. 04/27/11 05/27/11  Nehemiah Settle, APRN - CNS   aspirin 325 MG tablet Take 325 mg by mouth daily.      Historical Provider, MD   Multiple Vitamins-Minerals (ONE-A-DAY WOMENS 50 PLUS) TABS Take 1 tablet by mouth daily.      Historical Provider, MD   Flaxseed, Linseed, (FLAX SEED OIL) 1000 MG CAPS Take 1 tablet by mouth daily.      Historical Provider, MD       ALLERGIES:  Cephalexin    SOCIAL Hx:  Social History     Socioeconomic History   ??? Marital status: Married     Spouse name: Not on file   ??? Number of children: Not on file   ??? Years of education: Not on file   ??? Highest education level: Not on file   Occupational History   ??? Not on file   Tobacco Use   ??? Smoking status: Current Every Day Smoker     Packs/day: 1.00     Years: 30.00     Pack years: 30.00     Types: Cigarettes   Substance and Sexual Activity   ??? Alcohol use: Yes     Comment: Rarely   ??? Drug use: No   ??? Sexual activity: Not on file   Other Topics Concern   ??? Not on file   Social History Narrative   ??? Not on file     Social Determinants of Health     Financial Resource Strain:    ??? Difficulty of Paying Living Expenses:    Food Insecurity:    ??? Worried About Programme researcher, broadcasting/film/video in the Last Year:    ??? Barista in the Last Year:    Transportation Needs:    ??? Freight forwarder (Medical):    ??? Lack of Transportation (Non-Medical):    Physical Activity:    ??? Days of Exercise per Week:    ??? Minutes of Exercise per Session:     Stress:    ??? Feeling of Stress :    Social Connections:    ??? Frequency of Communication with Friends and Family:    ??? Frequency of Social Gatherings with Friends and Family:    ??? Attends Religious Services:    ??? Database administrator or Organizations:    ??? Attends Engineer, structural:    ??? Marital Status:    Intimate Programme researcher, broadcasting/film/video Violence:    ??? Fear of Current or Ex-Partner:    ??? Emotionally Abused:    ??? Physically Abused:    ??? Sexually Abused:        ROS: Positive in bold  General:   Denies chills, fatigue, fever, malaise, night sweats or weight loss    Psychological:   Denies anxiety, disorientation or hallucinations    ENT:    Denies epistaxis, headaches, vertigo or visual changes    Cardiovascular:   Denies any chest pain, irregular heartbeats, or palpitations. No paroxysmal nocturnal dyspnea.    Respiratory:   Denies shortness of breath, coughing, sputum production, hemoptysis, or wheezing.  No orthopnea.    Gastrointestinal:   Denies nausea, vomiting, diarrhea, or constipation.  Denies any abdominal pain.  Denies change in bowel habits or stools.      Genito-Urinary:    Denies any urgency, frequency, hematuria.  Voiding without difficulty.    Musculoskeletal:   Denies joint pain, joint stiffness, joint swelling or muscle pain    Neurology:    Denies any headache or focal neurological deficits. No weakness or paresthesia.    Derm:    Denies any rashes, ulcers, or excoriations.  Denies bruising.      Extremities:   Denies any lower extremity swelling or edema.      PHYSICAL EXAM: Abnormal findings noted  VITALS:  Vitals:    07/13/20 1600   BP: (!) 109/53   Pulse: 86   Resp: 15   Temp:    SpO2: 92%  CONSTITUTIONAL:    Awake, alert, cooperative, no apparent distress, and appears stated age    EYES:    EOMI, sclera clear, conjunctiva normal    ENT:    Normocephalic, atraumatic. External ears without lesions.     NECK:    Supple, symmetrical, trachea midline,  no JVD    HEMATOLOGIC/LYMPHATICS:    No  cervical lymphadenopathy and no supraclavicular lymphadenopathy    LUNGS:    Symmetric. No increased work of breathing, good air exchange, clear to auscultation bilaterally, no wheezes, rhonchi, or rales,     CARDIOVASCULAR:    Normal apical impulse, regular rate and rhythm, normal S1 and S2, no S3 or S4, and no murmur noted    ABDOMEN:    No scars, normal bowel sounds, soft, non-distended, non-tender    MUSCULOSKELETAL:    There is no redness, warmth, or swelling of the joints.      NEUROLOGIC:    Awake, alert, oriented to name, place and time.      SKIN:    No bruising or bleeding.  No redness, warmth, or swelling    EXTREMITIES:    Peripheral pulses present.  No edema, cyanosis, or swelling.    LINES/CATHETERS     LABORATORY DATA:  CBC with Differential:    Lab Results   Component Value Date    WBC 11.2 07/13/2020    RBC 5.26 07/13/2020    HGB 17.2 07/13/2020    HCT 51.1 07/13/2020    PLT 149 07/13/2020    MCV 97.1 07/13/2020    MCH 32.7 07/13/2020    MCHC 33.7 07/13/2020    RDW 12.6 07/13/2020    LYMPHOPCT 12.1 07/13/2020    MONOPCT 5.2 07/13/2020    BASOPCT 0.4 07/13/2020    MONOSABS 0.56 07/13/2020    LYMPHSABS 1.34 07/13/2020    EOSABS 0.10 07/13/2020    BASOSABS 0.00 07/13/2020     CMP:    Lab Results   Component Value Date    NA 140 07/13/2020    K 3.4 07/13/2020    K 2.9 07/13/2020    CL 102 07/13/2020    CO2 25 07/13/2020    BUN 42 07/13/2020    CREATININE 1.6 07/13/2020    GFRAA 38 07/13/2020    LABGLOM 31 07/13/2020    GLUCOSE 100 07/13/2020    GLUCOSE 87 03/05/2011    PROT 5.4 07/13/2020    LABALBU 3.1 07/13/2020    LABALBU 4.1 03/05/2011    CALCIUM 8.6 07/13/2020    BILITOT 0.8 07/13/2020    ALKPHOS 52 07/13/2020    AST 21 07/13/2020    ALT 14 07/13/2020       ASSESSMENT/PLAN:  1. Syncope and collapse  2. AKI  3. UTI  4. Elevated troponin  5. Prolonged QTC  6. Hypertension  7. hyperlipidemia  8. Current tobacco abuse    Patient presented after episode of syncope.  Patient the day before did not eat  anything yesterday and only drank very little.  At the same time she did take all her medications including her blood pressure medications.  Echocardiogram will be obtained as well as orthostatic blood pressures.  We will continue IV fluid resuscitation in the setting of AKI.  Additionally nephrotoxic medications will be held.  We will have her work with PT and OT.  Additionally patient UA was suggestive of UTI.  Although she denies any urinary symptoms she might have atypical presentation.  We will start her on Rocephin urine  culture.  Arbutus Pedsmail U Willow LakeHusain, OhioDO  6:42 PM  07/13/2020    Electronically signed by Renard HamperIsmail U Joh Rao, DO on 07/13/20 at 6:42 PM EDT

## 2020-07-13 NOTE — ED Notes (Signed)
Pt off the floor to CT     Pecola Leisure, RN  07/13/20 1941

## 2020-07-13 NOTE — ED Provider Notes (Signed)
[facility] 4 MED SURG [facility]  EMERGENCY DEPARTMENT ENCOUNTER      Pt Name: Jean Ball  MRN: [id number]  Birthdate May 23, 1946  Date of evaluation: 07/13/2020      CHIEF COMPLAINT       Chief Complaint   Patient presents with   ??? Dizziness     got dizzy and passed out while walking back to car. unknown if hit head. pt very diaphoretic   ??? Loss of Consciousness        HPI  Jean Ball is a 74 y.o. female with history of hypertension, chronic pain who presents to the emergency department after loss of consciousness.  Patient states that for the last day or so she been very fatigued and tired.  She had decreased appetite did not eat yesterday.  She went to the grocery store today.  Prior attempted course of became very lightheaded.  Walking to the car she suddenly passed out.  She fell.  She not sure how long she was on the ground passed out for.  EMS was called by bystander.  Patient complaining of lightheadedness and severe fatigue while in the emergency department initially.  She describes symptoms as moderate to severe, constant, nothing makes it better or worse.  She denies any headache, numbness, weakness or tingling.  Denies any abdominal pain nausea or vomiting.  Denies any chest pain or shortness of breath.        Except as noted above the remainder of the review of systems was reviewed and negative.     Review of Systems   Constitutional: Positive for appetite change and fatigue. Negative for chills, diaphoresis and fever.   HENT: Negative for rhinorrhea, sore throat and trouble swallowing.    Eyes: Negative for photophobia and pain.   Respiratory: Negative for apnea, cough, chest tightness, shortness of breath and wheezing.    Cardiovascular: Negative for chest pain, palpitations and leg swelling.   Gastrointestinal: Negative for abdominal pain, constipation, diarrhea, nausea and vomiting.   Endocrine: Negative for polyuria.   Genitourinary: Negative for difficulty urinating and dysuria.   Musculoskeletal: Negative  for back pain, neck pain and neck stiffness.   Neurological: Positive for syncope and light-headedness. Negative for dizziness, speech difficulty, weakness and headaches.   Psychiatric/Behavioral: Negative for confusion. The patient is not nervous/anxious.         Physical Exam  Vitals and nursing note reviewed.   Constitutional:       General: She is not in acute distress.     Appearance: She is well-developed. She is ill-appearing.      Comments: Awake and alert. Sitting in the gurney in no obvious distress.    HENT:      Head: Normocephalic and atraumatic.      Right Ear: External ear normal.      Left Ear: External ear normal.      Mouth/Throat:      Mouth: Mucous membranes are moist.   Eyes:      General: No scleral icterus.     Pupils: Pupils are equal, round, and reactive to light.   Cardiovascular:      Rate and Rhythm: Normal rate and regular rhythm.      Heart sounds: No murmur heard.        Comments: 2+ radial and dorsal pedis pulses bilaterally  Pulmonary:      Effort: Pulmonary effort is normal. No respiratory distress.      Breath sounds: Normal breath sounds. No wheezing.  Abdominal:      Palpations: Abdomen is soft.      Tenderness: There is no abdominal tenderness. There is no guarding or rebound.   Musculoskeletal:         General: No tenderness or deformity. Normal range of motion.      Cervical back: Normal range of motion and neck supple.      Right lower leg: No edema.      Left lower leg: No edema.   Skin:     General: Skin is warm and dry.      Capillary Refill: Capillary refill takes less than 2 seconds.   Neurological:      General: No focal deficit present.      Mental Status: She is alert and oriented to person, place, and time.      Cranial Nerves: No cranial nerve deficit.      Sensory: No sensory deficit.      Motor: No weakness or abnormal muscle tone.   Psychiatric:         Mood and Affect: Mood normal.         Behavior: Behavior normal.          Procedures     MDM   This is a  74 year old female with history of hypertension who presents after syncopal episode.  In the emergency department initially hypotensive blood pressure in the 80s.  Somewhat ill and pale appearing initially.  Administered normal saline IV fluid bolus improvement in blood pressure.  EKG showed no ischemic changes.  Normal sinus rhythm.  Patient with AKI of creatinine up to 1.6.  Administered fluids.  Metabolic panel initially hemolyzed that showed hypokalemia.  Repeat showed borderline normal hypokalemia.  CT head without contrast showed no acute intercranial abnormality.  CT cervical spine showed no fracture or dislocation.  X-ray knee showed no fracture dislocation.  Discussed patient with Dr. Eula Listen who assessed patient for further evaluation.                  --------------------------------------------- PAST HISTORY ---------------------------------------------  Past Medical History:  has a past medical history of Arthritis, COPD (chronic obstructive pulmonary disease) (HCC), and Hypertension.    Past Surgical History:  has a past surgical history that includes knee surgery; Total knee arthroplasty (2007); Total knee arthroplasty (2008); back surgery (1990); Tubal ligation; joint replacement (Left); and Cholecystectomy.    Social History:  reports that she has been smoking cigarettes. She started smoking about 59 years ago. She has a 30.00 pack-year smoking history. She has never used smokeless tobacco. She reports current alcohol use. She reports that she does not use drugs.    Family History: family history includes Cancer in an other family member.     The patient???s home medications have been reviewed.    Allergies: Cephalexin    -------------------------------------------------- RESULTS -------------------------------------------------    LABS:  Results for orders placed or performed during the hospital encounter of 07/13/20   Culture, Blood 1    Specimen: Blood   Result Value Ref Range    Blood Culture,  Routine 24 Hours no growth    Culture, Blood 2    Specimen: Blood   Result Value Ref Range    Culture, Blood 2 24 Hours no growth    CBC Auto Differential   Result Value Ref Range    WBC 11.2 4.5 - 11.5 E9/L    RBC 5.26 3.50 - 5.50 E12/L    Hemoglobin 17.2 (H) 11.5 - 15.5  g/dL    Hematocrit 41.6 (H) 34.0 - 48.0 %    MCV 97.1 80.0 - 99.9 fL    MCH 32.7 26.0 - 35.0 pg    MCHC 33.7 32.0 - 34.5 %    RDW 12.6 11.5 - 15.0 fL    Platelets 149 130 - 450 E9/L    MPV 13.8 (H) 7.0 - 12.0 fL    Neutrophils % 81.9 (H) 43.0 - 80.0 %    Lymphocytes % 12.1 (L) 20.0 - 42.0 %    Monocytes % 5.2 2.0 - 12.0 %    Eosinophils % 0.9 0.0 - 6.0 %    Basophils % 0.4 0.0 - 2.0 %    Neutrophils Absolute 9.18 (H) 1.80 - 7.30 E9/L    Lymphocytes Absolute 1.34 (L) 1.50 - 4.00 E9/L    Monocytes Absolute 0.56 0.10 - 0.95 E9/L    Eosinophils Absolute 0.10 0.05 - 0.50 E9/L    Basophils Absolute 0.00 0.00 - 0.20 E9/L    Poikilocytes 1+     Burr Cells 1+     Tear Drop Cells 1+    Comprehensive Metabolic Panel w/ Reflex to MG   Result Value Ref Range    Sodium 140 132 - 146 mmol/L    Potassium reflex Magnesium 2.9 (L) 3.5 - 5.0 mmol/L    Chloride 107 98 - 107 mmol/L    CO2 19 (L) 22 - 29 mmol/L    Anion Gap 14 7 - 16 mmol/L    Glucose 132 (H) 74 - 99 mg/dL    BUN 39 (H) 6 - 23 mg/dL    CREATININE 1.4 (H) 0.5 - 1.0 mg/dL    GFR Non-African American 37 >=60 mL/min/1.73    GFR African American 44     Calcium 7.2 (L) 8.6 - 10.2 mg/dL    Total Protein 5.4 (L) 6.4 - 8.3 g/dL    Albumin 3.1 (L) 3.5 - 5.2 g/dL    Total Bilirubin 0.8 0.0 - 1.2 mg/dL    Alkaline Phosphatase 52 35 - 104 U/L    ALT 14 0 - 32 U/L    AST 21 0 - 31 U/L   Lipase   Result Value Ref Range    Lipase 37 13 - 60 U/L   Brain Natriuretic Peptide   Result Value Ref Range    Pro-BNP 68 0 - 450 pg/mL   Urinalysis, reflex to microscopic   Result Value Ref Range    Color, UA Yellow Straw/Yellow    Clarity, UA Clear Clear    Glucose, Ur Negative Negative mg/dL    Bilirubin Urine Negative Negative     Ketones, Urine Negative Negative mg/dL    Specific Gravity, UA 1.020 1.005 - 1.030    Blood, Urine Negative Negative    pH, UA 5.0 5.0 - 9.0    Protein, UA Negative Negative mg/dL    Urobilinogen, Urine 0.2 <2.0 E.U./dL    Nitrite, Urine Negative Negative    Leukocyte Esterase, Urine SMALL (A) Negative   Troponin   Result Value Ref Range    Troponin, High Sensitivity 31 (H) 0 - 9 ng/L   SPECIMEN REJECTION   Result Value Ref Range    Rejected Test TRP5     Reason for Rejection see below    Magnesium   Result Value Ref Range    Magnesium 1.6 1.6 - 2.6 mg/dL   Basic metabolic panel   Result Value Ref Range    Sodium 140 132 -  146 mmol/L    Potassium 3.4 (L) 3.5 - 5.0 mmol/L    Chloride 102 98 - 107 mmol/L    CO2 25 22 - 29 mmol/L    Anion Gap 13 7 - 16 mmol/L    Glucose 100 (H) 74 - 99 mg/dL    BUN 42 (H) 6 - 23 mg/dL    CREATININE 1.6 (H) 0.5 - 1.0 mg/dL    GFR Non-African American 31 >=60 mL/min/1.73    GFR African American 38     Calcium 8.6 8.6 - 10.2 mg/dL   Troponin   Result Value Ref Range    Troponin, High Sensitivity 29 (H) 0 - 9 ng/L   Microscopic Urinalysis   Result Value Ref Range    WBC, UA 1-3 0 - 5 /HPF    RBC, UA NONE 0 - 2 /HPF    Epithelial Cells, UA RARE /HPF    Bacteria, UA MODERATE (A) None Seen /HPF   TSH without Reflex   Result Value Ref Range    TSH 0.282 0.270 - 4.200 uIU/mL   T4, Free   Result Value Ref Range    T4 Free 1.44 0.93 - 1.70 ng/dL   Phosphorus   Result Value Ref Range    Phosphorus 3.6 2.5 - 4.5 mg/dL   Magnesium   Result Value Ref Range    Magnesium 2.5 1.6 - 2.6 mg/dL   Comprehensive Metabolic Panel   Result Value Ref Range    Sodium 138 132 - 146 mmol/L    Potassium 3.7 3.5 - 5.0 mmol/L    Chloride 101 98 - 107 mmol/L    CO2 28 22 - 29 mmol/L    Anion Gap 9 7 - 16 mmol/L    Glucose 91 74 - 99 mg/dL    BUN 38 (H) 6 - 23 mg/dL    CREATININE 1.4 (H) 0.5 - 1.0 mg/dL    GFR Non-African American 37 >=60 mL/min/1.73    GFR African American 44     Calcium 8.5 (L) 8.6 - 10.2 mg/dL     Total Protein 6.4 6.4 - 8.3 g/dL    Albumin 3.7 3.5 - 5.2 g/dL    Total Bilirubin 0.7 0.0 - 1.2 mg/dL    Alkaline Phosphatase 66 35 - 104 U/L    ALT 14 0 - 32 U/L    AST 14 0 - 31 U/L   CBC Auto Differential   Result Value Ref Range    WBC 9.4 4.5 - 11.5 E9/L    RBC 4.82 3.50 - 5.50 E12/L    Hemoglobin 15.7 (H) 11.5 - 15.5 g/dL    Hematocrit 16.148.5 (H) 34.0 - 48.0 %    MCV 100.6 (H) 80.0 - 99.9 fL    MCH 32.6 26.0 - 35.0 pg    MCHC 32.4 32.0 - 34.5 %    RDW 12.8 11.5 - 15.0 fL    Platelets 136 130 - 450 E9/L    MPV 13.7 (H) 7.0 - 12.0 fL    Neutrophils % 76.9 43.0 - 80.0 %    Immature Granulocytes % 0.2 0.0 - 5.0 %    Lymphocytes % 13.3 (L) 20.0 - 42.0 %    Monocytes % 7.5 2.0 - 12.0 %    Eosinophils % 1.8 0.0 - 6.0 %    Basophils % 0.3 0.0 - 2.0 %    Neutrophils Absolute 7.22 1.80 - 7.30 E9/L    Immature Granulocytes # 0.02 E9/L  Lymphocytes Absolute 1.25 (L) 1.50 - 4.00 E9/L    Monocytes Absolute 0.70 0.10 - 0.95 E9/L    Eosinophils Absolute 0.17 0.05 - 0.50 E9/L    Basophils Absolute 0.03 0.00 - 0.20 E9/L   Procalcitonin   Result Value Ref Range    Procalcitonin 0.08 0.00 - 0.08 ng/mL   Lipid Panel   Result Value Ref Range    Cholesterol, Total 113 0 - 199 mg/dL    Triglycerides 90 0 - 149 mg/dL    HDL 39 >16 mg/dL    LDL Calculated 56 0 - 99 mg/dL    VLDL Cholesterol Calculated 18 mg/dL   Basic Metabolic Panel   Result Value Ref Range    Sodium 139 132 - 146 mmol/L    Potassium 4.6 3.5 - 5.0 mmol/L    Chloride 105 98 - 107 mmol/L    CO2 28 22 - 29 mmol/L    Anion Gap 6 (L) 7 - 16 mmol/L    Glucose 87 74 - 99 mg/dL    BUN 29 (H) 6 - 23 mg/dL    CREATININE 1.0 0.5 - 1.0 mg/dL    GFR Non-African American 54 >=60 mL/min/1.73    GFR African American >60     Calcium 8.4 (L) 8.6 - 10.2 mg/dL   EKG 12 Lead   Result Value Ref Range    Ventricular Rate 88 BPM    Atrial Rate 88 BPM    P-R Interval 168 ms    QRS Duration 80 ms    Q-T Interval 394 ms    QTc Calculation (Bazett) 476 ms    P Axis 42 degrees    R Axis -27  degrees    T Axis 38 degrees       RADIOLOGY:  CT CHEST WO CONTRAST   Final Result   1.  No pneumonia or acute cardiopulmonary disease.      2.  CT exam correlated with chest x-ray and no CT findings of pulmonary   nodule, mass, or suspicious lesion.         XR KNEE LEFT (3 VIEWS)   Final Result   No acute fractures or dislocations in the left knee.         XR CHEST 1 VIEW   Final Result   Somewhat nodular airspace opacity in the right mid lung zone.  This is   indeterminate and may be related to a nipple shadow or an underlying airspace   opacity related to an infectious or inflammatory pneumonitis.  A true   pulmonary nodule cannot be excluded.  Follow-up two view chest radiograph is   recommended to ensure resolution.  Alternatively, CT of the chest could be   performed.         CT Head WO Contrast   Final Result   No acute intracranial abnormality.         CT Cervical Spine WO Contrast   Final Result   No acute abnormality of the cervical spine.  Degenerative changes at multiple   levels as above commented..             EKG:  This EKG is signed and interpreted by me.    Rate: 88bpm  Rhythm: sinus  Interpretation: normal axis. No st segment changes.   Comparison: no previous EKG available      ------------------------- NURSING NOTES AND VITALS REVIEWED ---------------------------  Date / Time Roomed:  07/13/2020  2:22 PM  ED Bed Assignment:  [id number]    The nursing notes within the ED encounter and vital signs as below have been reviewed.     Patient Vitals for the past 24 hrs:   BP Temp Temp src Pulse Resp SpO2   07/15/20 0349 -- -- -- 79 -- --   07/14/20 2000 (!) 121/57 97.8 ??F (36.6 ??C) Oral 73 20 94 %       Oxygen Saturation Interpretation: Normal    ------------------------------------------ PROGRESS NOTES ------------------------------------------    Counseling:  I have spoken with the patient and discussed today???s results, in addition to providing specific details for the plan of care and counseling  regarding the diagnosis and prognosis.  Their questions are answered at this time and they are agreeable with the plan of admission.    --------------------------------- ADDITIONAL PROVIDER NOTES ---------------------------------  Consultations:   Spoke with Dr. Eula Listen.  Discussed case.  They will admit the patient.  This patient's ED course included: a personal history and physicial examination, re-evaluation prior to disposition, multiple bedside re-evaluations, IV medications and cardiac monitoring    This patient has remained hemodynamically stable during their ED course.    Diagnosis:  1. Hypotension, unspecified hypotension type    2. AKI (acute kidney injury) (HCC)    3. Dehydration        Disposition:  Patient's disposition: Admit to telemetry  Patient's condition is stable.         Lauralee Evener, DO  Resident  07/15/20 [phone]

## 2020-07-13 NOTE — ED Notes (Signed)
Made aware when able, we needed to collect urine sample. Stated when tripped and fell to ground the areas on the right side is where her chronic pain is , she is not having any new pains. There is no deformity , lacerations noted.      Elspeth Cho, RN  07/13/20 1507

## 2020-07-14 LAB — T4, FREE: T4 Free: 1.44 ng/dL (ref 0.93–1.70)

## 2020-07-14 LAB — COMPREHENSIVE METABOLIC PANEL
ALT: 14 U/L (ref 0–32)
AST: 14 U/L (ref 0–31)
Albumin: 3.7 g/dL (ref 3.5–5.2)
Alkaline Phosphatase: 66 U/L (ref 35–104)
Anion Gap: 9 mmol/L (ref 7–16)
BUN: 38 mg/dL — ABNORMAL HIGH (ref 6–23)
CO2: 28 mmol/L (ref 22–29)
Calcium: 8.5 mg/dL — ABNORMAL LOW (ref 8.6–10.2)
Chloride: 101 mmol/L (ref 98–107)
Creatinine: 1.4 mg/dL — ABNORMAL HIGH (ref 0.5–1.0)
GFR African American: 44
GFR Non-African American: 37 mL/min/{1.73_m2} (ref >=60)
Glucose: 91 mg/dL (ref 74–99)
Potassium: 3.7 mmol/L (ref 3.5–5.0)
Sodium: 138 mmol/L (ref 132–146)
Total Bilirubin: 0.7 mg/dL (ref 0.0–1.2)
Total Protein: 6.4 g/dL (ref 6.4–8.3)

## 2020-07-14 LAB — LIPID PANEL
Cholesterol, Total: 113 mg/dL (ref 0–199)
HDL: 39 mg/dL (ref >40)
LDL Calculated: 56 mg/dL (ref 0–99)
Triglycerides: 90 mg/dL (ref 0–149)
VLDL Cholesterol Calculated: 18 mg/dL

## 2020-07-14 LAB — CBC WITH AUTO DIFFERENTIAL
Basophils %: 0.3 % (ref 0.0–2.0)
Basophils Absolute: 0.03 E9/L (ref 0.00–0.20)
Eosinophils %: 1.8 % (ref 0.0–6.0)
Eosinophils Absolute: 0.17 E9/L (ref 0.05–0.50)
Hematocrit: 48.5 % — ABNORMAL HIGH (ref 34.0–48.0)
Hemoglobin: 15.7 g/dL — ABNORMAL HIGH (ref 11.5–15.5)
Immature Granulocytes #: 0.02 E9/L
Immature Granulocytes %: 0.2 % (ref 0.0–5.0)
Lymphocytes %: 13.3 % — ABNORMAL LOW (ref 20.0–42.0)
Lymphocytes Absolute: 1.25 E9/L — ABNORMAL LOW (ref 1.50–4.00)
MCH: 32.6 pg (ref 26.0–35.0)
MCHC: 32.4 % (ref 32.0–34.5)
MCV: 100.6 fL — ABNORMAL HIGH (ref 80.0–99.9)
MPV: 13.7 fL — ABNORMAL HIGH (ref 7.0–12.0)
Monocytes %: 7.5 % (ref 2.0–12.0)
Monocytes Absolute: 0.7 E9/L (ref 0.10–0.95)
Neutrophils %: 76.9 % (ref 43.0–80.0)
Neutrophils Absolute: 7.22 E9/L (ref 1.80–7.30)
Platelets: 136 E9/L (ref 130–450)
RBC: 4.82 E12/L (ref 3.50–5.50)
RDW: 12.8 fL (ref 11.5–15.0)
WBC: 9.4 E9/L (ref 4.5–11.5)

## 2020-07-14 LAB — PHOSPHORUS: Phosphorus: 3.6 mg/dL (ref 2.5–4.5)

## 2020-07-14 LAB — TROPONIN: Troponin, High Sensitivity: 29 ng/L — ABNORMAL HIGH (ref 0–9)

## 2020-07-14 LAB — MAGNESIUM: Magnesium: 2.5 mg/dL (ref 1.6–2.6)

## 2020-07-14 LAB — TSH: TSH: 0.282 u[IU]/mL (ref 0.270–4.200)

## 2020-07-14 LAB — PROCALCITONIN: Procalcitonin: 0.08 ng/mL (ref 0.00–0.08)

## 2020-07-14 MED ORDER — NORMAL SALINE FLUSH 0.9 % IV SOLN
0.9 % | INTRAVENOUS | Status: DC | PRN
Start: 2020-07-14 — End: 2020-07-15

## 2020-07-14 MED ORDER — PERFLUTREN LIPID MICROSPHERE IV SUSP
Freq: Once | INTRAVENOUS | Status: DC | PRN
Start: 2020-07-14 — End: 2020-07-15

## 2020-07-14 MED ORDER — SODIUM CHLORIDE 0.9 % IV SOLN
0.9 % | INTRAVENOUS | Status: DC
Start: 2020-07-14 — End: 2020-07-15
  Administered 2020-07-14 (×2): via INTRAVENOUS

## 2020-07-14 MED ORDER — POTASSIUM CHLORIDE CRYS ER 20 MEQ PO TBCR
20 MEQ | Freq: Two times a day (BID) | ORAL | Status: DC
Start: 2020-07-14 — End: 2020-07-15
  Administered 2020-07-14 – 2020-07-15 (×2): 20 meq via ORAL

## 2020-07-14 MED ORDER — SODIUM CHLORIDE 0.9 % IV SOLN
0.9 % | INTRAVENOUS | Status: DC | PRN
Start: 2020-07-14 — End: 2020-07-15

## 2020-07-14 MED ORDER — GLUCOSE 40 % PO GEL
40 % | ORAL | Status: DC | PRN
Start: 2020-07-14 — End: 2020-07-15

## 2020-07-14 MED ORDER — ACETAMINOPHEN 325 MG PO TABS
325 MG | Freq: Four times a day (QID) | ORAL | Status: DC | PRN
Start: 2020-07-14 — End: 2020-07-15
  Administered 2020-07-14: 17:00:00 650 mg via ORAL

## 2020-07-14 MED ORDER — NORMAL SALINE FLUSH 0.9 % IV SOLN
0.9 | Freq: Two times a day (BID) | INTRAVENOUS | Status: DC
Start: 2020-07-14 — End: 2020-07-15
  Administered 2020-07-14 – 2020-07-15 (×3): 10 mL via INTRAVENOUS

## 2020-07-14 MED ORDER — ENOXAPARIN SODIUM 40 MG/0.4ML SC SOLN
400.4 MG/0.4ML | Freq: Every day | SUBCUTANEOUS | Status: DC
Start: 2020-07-14 — End: 2020-07-15
  Administered 2020-07-14 – 2020-07-15 (×2): 40 mg via SUBCUTANEOUS

## 2020-07-14 MED ORDER — CEFTRIAXONE SODIUM 1 G IJ SOLR
1 g | INTRAMUSCULAR | Status: DC
Start: 2020-07-14 — End: 2020-07-15
  Administered 2020-07-14 – 2020-07-15 (×2): 1000 mg via INTRAVENOUS

## 2020-07-14 MED ORDER — DIPHENHYDRAMINE HCL 25 MG PO TABS
25 MG | ORAL | Status: DC
Start: 2020-07-14 — End: 2020-07-15
  Administered 2020-07-14 – 2020-07-15 (×2): 25 mg via ORAL

## 2020-07-14 MED ORDER — POLYETHYLENE GLYCOL 3350 17 G PO PACK
17 g | Freq: Every day | ORAL | Status: DC | PRN
Start: 2020-07-14 — End: 2020-07-15

## 2020-07-14 MED ORDER — HYDROCODONE-ACETAMINOPHEN 7.5-325 MG PO TABS
Freq: Four times a day (QID) | ORAL | Status: DC | PRN
Start: 2020-07-14 — End: 2020-07-15
  Administered 2020-07-15: 1 via ORAL

## 2020-07-14 MED ORDER — DEXTROSE 5 % IV SOLN
5 | INTRAVENOUS | Status: DC | PRN
Start: 2020-07-14 — End: 2020-07-15

## 2020-07-14 MED ORDER — DEXTROSE 50 % IV SOLN
50 % | INTRAVENOUS | Status: DC | PRN
Start: 2020-07-14 — End: 2020-07-15

## 2020-07-14 MED ORDER — GLUCAGON HCL RDNA (DIAGNOSTIC) 1 MG IJ SOLR
1 MG | INTRAMUSCULAR | Status: DC | PRN
Start: 2020-07-14 — End: 2020-07-15

## 2020-07-14 MED ORDER — ACETAMINOPHEN 650 MG RE SUPP
650 MG | Freq: Four times a day (QID) | RECTAL | Status: DC | PRN
Start: 2020-07-14 — End: 2020-07-15

## 2020-07-14 MED ORDER — ASPIRIN 325 MG PO TABS
325 MG | Freq: Every day | ORAL | Status: DC
Start: 2020-07-14 — End: 2020-07-14
  Administered 2020-07-14: 13:00:00 325 mg via ORAL

## 2020-07-14 MED FILL — CEFTRIAXONE SODIUM 1 G IJ SOLR: 1 g | INTRAMUSCULAR | Qty: 1000

## 2020-07-14 MED FILL — MAPAP 325 MG PO TABS: 325 mg | ORAL | Qty: 2

## 2020-07-14 MED FILL — NORMAL SALINE FLUSH 0.9 % IV SOLN: 0.9 % | INTRAVENOUS | Qty: 10

## 2020-07-14 MED FILL — BANOPHEN 25 MG PO TABS: 25 mg | ORAL | Qty: 1

## 2020-07-14 MED FILL — ASPIRIN 325 MG PO TABS: 325 mg | ORAL | Qty: 1

## 2020-07-14 MED FILL — POTASSIUM CHLORIDE CRYS ER 20 MEQ PO TBCR: 20 meq | ORAL | Qty: 1

## 2020-07-14 MED FILL — ENOXAPARIN SODIUM 40 MG/0.4ML SC SOLN: 40 MG/0.4ML | SUBCUTANEOUS | Qty: 0.4

## 2020-07-14 MED FILL — NICOTINE 21 MG/24HR TD PT24: 21 mg/(24.h) | TRANSDERMAL | Qty: 1

## 2020-07-14 NOTE — Progress Notes (Addendum)
Department of Internal Medicine  PN    PCP: Jacqulyn Liner, DO  Admitting Physician: Dr. Luan Pulling  Consultants:   Date of Service: 07/13/2020    CHIEF COMPLAINT:  Syncope and collapse    HISTORY OF PRESENT ILLNESS:     Patient is 74 year old female presented to the ED due to syncope and collapse.  Patient states that for the last day she has had decreased appetite and did not eat yesterday.  She did have 2 cups of coffee and a cup of green tea yesterday.  She did have something to drink earlier today.  Patient went shopping earlier today and towards the end of her shopping she began to feel diaphoretic and faint.  While walking she suddenly passed out.  She is unsure if she injured anything on the fall.  She is unsure trauma to her head.  However she does complain of left knee pain.  Otherwise she denies any shortness of breath or chest pain prior to passing out.  She denies any fever or chills.  She denies any nausea or emesis.  She does not complain of diarrhea.  She not complain of any urinary issues.    07/14/2020  Patient seen examined on telemetry floor.  Patient states he feels a lot better today.  Patient denies any dizziness and the weakness has dramatically improved.  Patient denies any problem with any chest pain, abdominal pain, unusual shortness of breath.  BUN/creatinine 38/1.4 with potassium increased 3.7 this morning.  Normal liver enzymes with WBC 9.4 and hemoglobin 15.7.  Temperature is 98.4 with heart rate of 63 and blood pressure is increased to 100 2758 again with patient hypotensive on admission 8453.  Urinary output is adequate.  CT the chest on admission did not show any evidence of pneumonia or masses.  The patient was obviously volume depleted and with a combination of being on the diuretic and ARB decreased renal perfusion more and blood pressure and she became extremely symptomatic.  Interestingly enough the patient's been on IV fluids and receiving 2 L IV bolus in the ED with no  improvement in her BUN and creatinine.  The patient will continue on the IV fluids and we will increase the rate a little bit.  Patient blood pressure though has dramatically improved from admission which was markedly hypotensive.  Continue to hold the Hyzaar, Motrin and decrease aspirin from 325 to 81 mg.    PAST MEDICAL Hx:  Past Medical History:   Diagnosis Date   ??? Arthritis    ??? COPD (chronic obstructive pulmonary disease) (HCC)    ??? Hypertension        PAST SURGICAL Hx:   Past Surgical History:   Procedure Laterality Date   ??? BACK SURGERY  1990    L4-5   ??? CHOLECYSTECTOMY     ??? JOINT REPLACEMENT Left     hip and knee   ??? KNEE SURGERY      x 3 Arthroscopy LEFT   ??? 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 TOTAL KNEE ARTHROPLASTY  2008    [facility]   ??? TUBAL LIGATION         FAMILY Hx:  Family History   Problem Relation Age of Onset   ??? Cancer Other        HOME MEDICATIONS:  Prior to Admission medications    Medication Sig Start Date End Date Taking? Authorizing Provider   HYDROcodone-acetaminophen (NORCO) 7.5-325 MG per tablet Take  1 tablet by mouth every 8 hours as needed for Pain 11/05/14 07/13/20 Yes Jason Sindledecker, APRN - CNP   Psyllium-Calcium (METAMUCIL PLUS CALCIUM PO) Take  by mouth.   Yes Historical Provider, MD   losartan-hydrochlorothiazide (HYZAAR) 50-12.5 MG per tablet Take 1 tablet by mouth daily.   Yes Historical Provider, MD   OMEGA-3 KRILL OIL 300 MG CAPS Take 1 tablet by mouth daily.     Yes Historical Provider, MD   aspirin 325 MG tablet Take 325 mg by mouth daily.     Yes Historical Provider, MD   Multiple Vitamins-Minerals (ONE-A-DAY WOMENS 50 PLUS) TABS Take 1 tablet by mouth daily.     Yes Historical Provider, MD   Flaxseed, Linseed, (FLAX SEED OIL) 1000 MG CAPS Take 1 tablet by mouth daily.     Yes Historical Provider, MD   ibuprofen (ADVIL;MOTRIN) 600 MG tablet Take 1 tablet by mouth every 8 hours as needed for Pain Take with food. 10/05/14 02/02/15  Stephannie PetersSteven Joseph Humansky, PA   albuterol  (PROVENTIL HFA) 108 (90 BASE) MCG/ACT inhaler Inhale 2 puffs into the lungs every 6 hours as needed for Wheezing for 7 days. 02/23/12 03/18/12  Joaquin CourtsWalter E Scott, DO   Diclofenac Sodium (PENNSAID) 1.5 % SOLN Place 40 drops onto the skin 2 times daily as needed for 30 days. 04/27/11 05/27/11  Nehemiah SettleLinda L Garea, APRN - CNS       ALLERGIES:  Cephalexin    SOCIAL Hx:  Social History     Socioeconomic History   ??? Marital status: Married     Spouse name: Dimas AguasHoward   ??? Number of children: 2   ??? Years of education: 9311   ??? Highest education level: Not on file   Occupational History   ??? Not on file   Tobacco Use   ??? Smoking status: Current Every Day Smoker     Packs/day: 1.00     Years: 30.00     Pack years: 30.00     Types: Cigarettes     Start date: 04/12/1961   ??? Smokeless tobacco: Never Used   Vaping Use   ??? Vaping Use: Never used   Substance and Sexual Activity   ??? Alcohol use: Yes     Comment: Rarely   ??? Drug use: No   ??? Sexual activity: Not Currently   Other Topics Concern   ??? Not on file   Social History Narrative   ??? Not on file     Social Determinants of Health     Financial Resource Strain:    ??? Difficulty of Paying Living Expenses:    Food Insecurity:    ??? Worried About Programme researcher, broadcasting/film/videounning Out of Food in the Last Year:    ??? Baristaan Out of Food in the Last Year:    Transportation Needs:    ??? Freight forwarderLack of Transportation (Medical):    ??? Lack of Transportation (Non-Medical):    Physical Activity:    ??? Days of Exercise per Week:    ??? Minutes of Exercise per Session:    Stress:    ??? Feeling of Stress :    Social Connections:    ??? Frequency of Communication with Friends and Family:    ??? Frequency of Social Gatherings with Friends and Family:    ??? Attends Religious Services:    ??? Database administratorActive Member of Clubs or Organizations:    ??? Attends BankerClub or Organization Meetings:    ??? Marital Status:    Intimate Programme researcher, broadcasting/film/videoartner  Violence:    ??? Fear of Current or Ex-Partner:    ??? Emotionally Abused:    ??? Physically Abused:    ??? Sexually Abused:        ROS: Positive in bold  General:    Denies chills, fatigue, fever, malaise, night sweats or weight loss    Psychological:   Denies anxiety, disorientation or hallucinations    ENT:    Denies epistaxis, headaches, vertigo or visual changes    Cardiovascular:   Denies any chest pain, irregular heartbeats, or palpitations. No paroxysmal nocturnal dyspnea.    Respiratory:   Denies shortness of breath, coughing, sputum production, hemoptysis, or wheezing.  No orthopnea.    Gastrointestinal:   Denies nausea, vomiting, diarrhea, or constipation.  Denies any abdominal pain.  Denies change in bowel habits or stools.      Genito-Urinary:    Denies any urgency, frequency, hematuria.  Voiding without difficulty.    Musculoskeletal:   Denies joint pain, joint stiffness, joint swelling or muscle pain    Neurology:    Denies any headache or focal neurological deficits. No weakness or paresthesia.    Derm:    Denies any rashes, ulcers, or excoriations.  Denies bruising.      Extremities:   Denies any lower extremity swelling or edema.      PHYSICAL EXAM: Abnormal findings noted  VITALS:  Vitals:    07/14/20 0800   BP: (!) 127/58   Pulse: 63   Resp: 16   Temp: 98.4 ??F (36.9 ??C)   SpO2: 93%         CONSTITUTIONAL:    Awake, alert, cooperative, no apparent distress, and appears stated age    EYES:    EOMI, sclera clear, conjunctiva normal    ENT:    Normocephalic, atraumatic. External ears without lesions.     NECK:    Supple, symmetrical, trachea midline,  no JVD    HEMATOLOGIC/LYMPHATICS:    No cervical lymphadenopathy and no supraclavicular lymphadenopathy    LUNGS:    Symmetric. No increased work of breathing, good air exchange, clear to auscultation bilaterally, no wheezes, rhonchi, or rales,     CARDIOVASCULAR:    Normal apical impulse, regular rate and rhythm, normal S1 and S2, no S3 or S4, and no murmur noted    ABDOMEN:    No scars, normal bowel sounds, soft, non-distended, non-tender    MUSCULOSKELETAL:    There is no redness, warmth, or swelling of the  joints.      NEUROLOGIC:    Awake, alert, oriented to name, place and time.      SKIN:    No bruising or bleeding.  No redness, warmth, or swelling    EXTREMITIES:    Peripheral pulses present.  No edema, cyanosis, or swelling.    LINES/CATHETERS     LABORATORY DATA:  CBC with Differential:    Lab Results   Component Value Date    WBC 9.4 07/14/2020    RBC 4.82 07/14/2020    HGB 15.7 07/14/2020    HCT 48.5 07/14/2020    PLT 136 07/14/2020    MCV 100.6 07/14/2020    MCH 32.6 07/14/2020    MCHC 32.4 07/14/2020    RDW 12.8 07/14/2020    LYMPHOPCT 13.3 07/14/2020    MONOPCT 7.5 07/14/2020    BASOPCT 0.3 07/14/2020    MONOSABS 0.70 07/14/2020    LYMPHSABS 1.25 07/14/2020    EOSABS 0.17 07/14/2020    BASOSABS 0.03 07/14/2020  CMP:    Lab Results   Component Value Date    NA 138 07/14/2020    K 3.7 07/14/2020    K 2.9 07/13/2020    CL 101 07/14/2020    CO2 28 07/14/2020    BUN 38 07/14/2020    CREATININE 1.4 07/14/2020    GFRAA 44 07/14/2020    LABGLOM 37 07/14/2020    GLUCOSE 91 07/14/2020    GLUCOSE 87 03/05/2011    PROT 6.4 07/14/2020    LABALBU 3.7 07/14/2020    LABALBU 4.1 03/05/2011    CALCIUM 8.5 07/14/2020    BILITOT 0.7 07/14/2020    ALKPHOS 66 07/14/2020    AST 14 07/14/2020    ALT 14 07/14/2020       ASSESSMENT/PLAN:  1. Syncope and collapse  2. AKI  3. Bacteriuria with possible UTI  4. Acute hypotension with history of hypertension  5. Elevated troponin  6. Prolonged QTC  7. hyperlipidemia  8. Current tobacco abuse    Patient presented after episode of syncope.  Patient the day before did not eat anything yesterday and only drank very little.  At the same time she did take all her medications including her blood pressure medications.  Echocardiogram will be obtained as well as orthostatic blood pressures.  We will continue IV fluid resuscitation in the setting of AKI.  Additionally nephrotoxic medications will be held.  We will have her work with PT and OT.  Additionally patient UA showed moderate bacteria  and small leukocyte esterase but only 1-3 WBCs - ?  UTI.  Although she denies any urinary symptoms she might have atypical presentation.  We will start her on Rocephin urine culture.    Home medications reviewed  Chart reviewed from admission  Tests and scans reviewed with patient  Continued to hold Hyzaar, Motrin  IV fluids normal saline at 100 cc an hour  KCl 20 mill equivalents daily  Recheck BMP in a.m.  Increase activity as tolerated  Blood pressure spine standing        Farrel Gobble, DO  12:23 PM  07/14/2020

## 2020-07-14 NOTE — Plan of Care (Signed)
Problem: Falls - Risk of:  Goal: Will remain free from falls  Description: Will remain free from falls  Outcome: Met This Shift     Problem: Falls - Risk of:  Goal: Absence of physical injury  Description: Absence of physical injury  Outcome: Met This Shift

## 2020-07-14 NOTE — Progress Notes (Signed)
Physical Therapy    Physical Therapy Initial Evaluation/Plan of Care    Room #:  0429/0429-01  Patient Name: Jean Ball  Date Of Birth: 04-07-1946  MRN: [id number]    Date of Service: 07/14/2020     Tentative placement recommendation: Home  Equipment recommendation: Patient has needed equipment       Evaluating Physical Therapist: Irene Pap, [place]  #[id number]      Specific Provider Orders/Date/Referring Provider :  07/13/20 2245   PT eval and treat Start: 07/13/20 2245, End: 07/13/20 2245, ONE TIME, Standing Count: 1 Occurrences, R    Rubie Maid, DO      Admitting Diagnosis:   Dehydration [E86.0]  Hypotension [I95.9]  AKI (acute kidney injury) ([place]) [N17.9]  Hypotension, unspecified hypotension type [I95.9]    syncope and collapse; landed on left knee xry neg    Visit Diagnoses       Codes    AKI (acute kidney injury) ([place])     N17.9    Dehydration     E86.0             Patient Active Problem List   Diagnosis   ??? Medial meniscus tear, Left knee   ??? Lateral meniscus tear, Left knee   ??? Left knee DJD   ??? Chondromalacia of patella, left   ??? Sprain and strain of knee and leg   ??? Hypotension        ASSESSMENT of Current Deficits Patient exhibits decreased strength, balance, endurance, range of motion and pain left knee  impairing functional mobility, transfers, gait , gait distance and tolerance to activity  are barriers to d/c and require skilled intervention during hospital stay to attain pre hospital level of function. Decreased tolerance to pain impacts indep mobilty       PHYSICAL THERAPY  PLAN OF CARE       Physical therapy plan of care is established based on physician order,  patient diagnosis and clinical assessment    Current Treatment Recommendations:    -Transfers: Provide instruction on proper hand and foot position for adequate transfer of weight onto lower extremities and use of gait device and Provide stabilization to prevent fall   -Gait: Gait training, Standing activities to improve: base of  support, weight shift, weight bearing  and Exercises to improve hip and knee control   -Endurance: Utilize Supervised activities to increase level of endurance to allow for safe functional mobility including transfers and gait   -Stairs: Stair training with instruction on proper technique and hand placement on rail    PT long term treatment goals are located in below grid    Patient and or family understand(s) diagnosis, prognosis, and plan of care.    Frequency of treatments: Patient will be seen  daily.         Prior Level of Function: Patient ambulated independently    Rehab Potential: good    for baseline    Past medical history:   Past Medical History:   Diagnosis Date   ??? Arthritis    ??? COPD (chronic obstructive pulmonary disease) ([place])    ??? Hypertension      Past Surgical History:   Procedure Laterality Date   ??? BACK SURGERY  1990    L4-5   ??? CHOLECYSTECTOMY     ??? JOINT REPLACEMENT Left     hip and knee   ??? KNEE SURGERY      x 3 Arthroscopy LEFT   ??? 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 TOTAL KNEE ARTHROPLASTY  2008    [facility]   ??? TUBAL LIGATION         SUBJECTIVE:    Precautions: Up with assistance, falls and alarm ,    Social history: Patient lives with spouse   in a ranch home  with 3 steps  to enter with [employer] in Mining engineer owned: Radio producer, IT sales professional and Engineer, civil (consulting),       [place]   How much difficulty turning over in bed?: None  How much difficulty sitting down on / standing up from a chair with arms?: A Little  How much difficulty moving from lying on back to sitting on side of bed?: None  How much help from another person moving to and from a bed to a chair?: A Little  How much help from another person needed to walk in hospital room?: A Little  How much help from another person for climbing 3-5 steps with a railing?: A Little  AM-PAC Inpatient Mobility Raw Score : 20  AM-PAC Inpatient T-Scale Score : 47.67  Mobility Inpatient CMS  0-100% Score: 35.83  Mobility Inpatient CMS G-Code Modifier : [place] cleared patient for PT evaluation. The admitting diagnosis and active problem list as listed above have been reviewed prior to the initiation of this evaluation.    OBJECTIVE;   Initial Evaluation  Date: 07/14/2020 Treatment Date:     Short Term/ Long Term   Goals   Was pt agreeable to Eval/treatment? Yes  To be met in 2 days   Pain level   9/10  left knee     Bed Mobility    Rolling: Independent    Supine to sit: Independent    Sit to supine: Independent    Scooting: Independent       Transfers Sit to stand: Supervision     Sit to stand: Independent     Ambulation    45 feet using  wheeled walker with Supervision    Patient with antalgic gait left lower extremity and cues for upright posture and safety   150 feet using  wheeled walker with Independent    Stair negotiation: ascended and descended   Not assessed     3 steps with rail and cane independent    ROM Within functional limits    Increase range of motion 10% of affected joints    Strength BUE:  4/5  RLE:  4/5  LLE:  3-/5  Increase strength in affected mm groups by 1/3 grade   Balance Sitting EOB:  good    Dynamic Standing:  poor    Sitting EOB:  good    Dynamic Standing: fair with wheeled walker      Patient is Alert & Oriented x person, place, time and situation and follows directions    Sensation:  Patient  denies numbness/tingling   Edema:  yes left knee   Endurance: fair       Vitals: room air   Blood Pressure at rest  Blood Pressure during session    Heart Rate at rest 65 Heart Rate during session     SPO2 at rest 95 %  SPO2 during session 93  %     Patient education  Patient educated on role of Physical Therapy, risks of immobility, safety and plan of care and  importance of mobility while in hospital      Patient response to education:   Pt verbalized understanding Pt demonstrated skill Pt requires further education in this area   Yes Partial Yes      Treatment:  Patient  practiced and was instructed/facilitated in the following treatment: Patient   Sat edge of bed 5??minutes with Supervision  to increase dynamic sitting balance and activity tolerance.       Therapeutic Exercises:  ankle pumps, quad sets, glut sets, heel slide, straight leg raise and long arc quad  x 10 reps.       At end of session, patient in bed with alarm call light and phone within reach,  all lines and tubes intact, nursing notified.      Patient would benefit from continued skilled Physical Therapy to improve functional independence and quality of life.         Patient's/ family goals   home    Time in  70  Time out  1045    Total Treatment Time  10 minutes    Evaluation time includes thorough review of current medical information, gathering information on past medical history/social history and prior level of function, completion of standardized testing/informal observation of tasks, assessment of data, and development of Plan of care and goals.     CPT codes:  Low Complexity PT evaluation (37169)  Therapeutic exercises (97110)   10 minutes  1 unit(s)    Irene Pap, PT

## 2020-07-14 NOTE — Plan of Care (Signed)
Problem: Falls - Risk of:  Goal: Will remain free from falls  Description: Will remain free from falls  07/14/2020 0951 by Suzan Slick, RN  Outcome: Met This Shift  07/14/2020 0102 by Zoe Lan, RN  Outcome: Met This Shift  Goal: Absence of physical injury  Description: Absence of physical injury  07/14/2020 0951 by Suzan Slick, RN  Outcome: Met This Shift  07/14/2020 0102 by Zoe Lan, RN  Outcome: Met This Shift   Continue current treataments

## 2020-07-14 NOTE — Other (Signed)
Chaplain called in to provide spiritual care to patient at husband's request via nurse.  Patient's husband Jean Ball stated that the patient is Catholic.  I updated her religion in our system and passed on the information to the weekday chaplains so she can receive the Anointing of the Sick.  Spiritual support was provided to husband through active listening, exploring thoughts and feelings, nurturing hope, discussing religious beliefs and prayer.  Husband was appreciative.          07/14/20 1553   Encounter Summary   Services provided to: Significant other;Patient   Referral/Consult From: Nurse;Family   Support System Spouse   Continue Visiting   [phone] ds)   Complexity of Encounter Moderate   Spiritual Assessment Completed Yes   Routine   Type Initial   Spiritual/Religious   Type Spiritual support   Assessment Calm;Approachable;Anxious   Intervention Active listening;Explored feelings, thoughts, concerns;Nurtured hope;Prayer;Scripture;Provided reading Herbalist;Discussed relationship with God;Discussed belief system/religious practices/faith;Discussed illness/injury and it's impact   Outcome Comfort;Expressed gratitude;Engaged in conversation;Expressed feelings/needs/concerns;Encouraged;Receptive;Less anxious, less agitated

## 2020-07-15 LAB — BASIC METABOLIC PANEL
Anion Gap: 6 mmol/L — ABNORMAL LOW (ref 7–16)
BUN: 29 mg/dL — ABNORMAL HIGH (ref 6–23)
CO2: 28 mmol/L (ref 22–29)
Calcium: 8.4 mg/dL — ABNORMAL LOW (ref 8.6–10.2)
Chloride: 105 mmol/L (ref 98–107)
Creatinine: 1 mg/dL (ref 0.5–1.0)
GFR African American: >60
GFR Non-African American: 54 mL/min/{1.73_m2} (ref >=60)
Glucose: 87 mg/dL (ref 74–99)
Potassium: 4.6 mmol/L (ref 3.5–5.0)
Sodium: 139 mmol/L (ref 132–146)

## 2020-07-15 MED ORDER — ASPIRIN 325 MG PO TABS
325 MG | ORAL_TABLET | Freq: Every day | ORAL | 3 refills | Status: AC
Start: 2020-07-15 — End: ?

## 2020-07-15 MED ORDER — LOSARTAN POTASSIUM 25 MG PO TABS
25 MG | ORAL_TABLET | Freq: Every day | ORAL | 3 refills | Status: AC
Start: 2020-07-15 — End: ?

## 2020-07-15 MED FILL — HYDROCODONE-ACETAMINOPHEN 7.5-325 MG PO TABS: 7.5-325 mg | ORAL | Qty: 1

## 2020-07-15 MED FILL — POTASSIUM CHLORIDE CRYS ER 20 MEQ PO TBCR: 20 meq | ORAL | Qty: 1

## 2020-07-15 MED FILL — BANOPHEN 25 MG PO TABS: 25 mg | ORAL | Qty: 1

## 2020-07-15 MED FILL — CEFTRIAXONE SODIUM 1 G IJ SOLR: 1 g | INTRAMUSCULAR | Qty: 1000

## 2020-07-15 MED FILL — ENOXAPARIN SODIUM 40 MG/0.4ML SC SOLN: 40 MG/0.4ML | SUBCUTANEOUS | Qty: 0.4

## 2020-07-15 MED FILL — NORMAL SALINE FLUSH 0.9 % IV SOLN: 0.9 % | INTRAVENOUS | Qty: 10

## 2020-07-15 MED FILL — NICOTINE 21 MG/24HR TD PT24: 21 mg/(24.h) | TRANSDERMAL | Qty: 1

## 2020-07-15 NOTE — Discharge Summary (Signed)
Department of Internal Medicine  DS    PCP: Jacqulyn LinerPhillip P Malvasi, DO  Admitting Physician: Dr. Luan Pullingunlap  Consultants:   Date of Service: 07/13/2020    CHIEF COMPLAINT:  Syncope and collapse    HISTORY OF PRESENT ILLNESS:     Patient is 74 year old female presented to the ED due to syncope and collapse.  Patient states that for the last day she has had decreased appetite and did not eat yesterday.  She did have 2 cups of coffee and a cup of green tea yesterday.  She did have something to drink earlier today.  Patient went shopping earlier today and towards the end of her shopping she began to feel diaphoretic and faint.  While walking she suddenly passed out.  She is unsure if she injured anything on the fall.  She is unsure trauma to her head.  However she does complain of left knee pain.  Otherwise she denies any shortness of breath or chest pain prior to passing out.  She denies any fever or chills.  She denies any nausea or emesis.  She does not complain of diarrhea.  She not complain of any urinary issues.    07/14/2020  Patient seen examined on telemetry floor.  Patient states he feels a lot better today.  Patient denies any dizziness and the weakness has dramatically improved.  Patient denies any problem with any chest pain, abdominal pain, unusual shortness of breath.  BUN/creatinine 38/1.4 with potassium increased 3.7 this morning.  Normal liver enzymes with WBC 9.4 and hemoglobin 15.7.  Temperature is 98.4 with heart rate of 63 and blood pressure is increased to 100 2758 again with patient hypotensive on admission 8453.  Urinary output is adequate.  CT the chest on admission did not show any evidence of pneumonia or masses.  The patient was obviously volume depleted and with a combination of being on the diuretic and ARB decreased renal perfusion more and blood pressure and she became extremely symptomatic.  Interestingly enough the patient's been on IV fluids and receiving 2 L IV bolus in the ED with no  improvement in her BUN and creatinine.  The patient will continue on the IV fluids and we will increase the rate a little bit.  Patient blood pressure though has dramatically improved from admission which was markedly hypotensive.  Continue to hold the Hyzaar, Motrin and decrease aspirin from 325 to 81 mg.    07/15/2020  Patient seen and examined on telemetry floor.  Patient husband at the bedside and case discussed.  Patient feels a lot better.  Patient blood pressure last night yesterday afternoon were low normal.  Patient's BUN/creatinine was 29/1.0 today.  Vital signs stable patient denies any problem with any lightheadedness/dizziness with standing or walking.  Patient will be discharged home today with adjustment of her meds.    Patient stable for discharge    PAST MEDICAL Hx:  Past Medical History:   Diagnosis Date   ??? Arthritis    ??? COPD (chronic obstructive pulmonary disease) (HCC)    ??? Hypertension        PAST SURGICAL Hx:   Past Surgical History:   Procedure Laterality Date   ??? BACK SURGERY  1990    L4-5   ??? CHOLECYSTECTOMY     ??? JOINT REPLACEMENT Left     hip and knee   ??? KNEE SURGERY      x 3 Arthroscopy LEFT   ??? 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 TOTAL KNEE ARTHROPLASTY  2008    [facility]   ??? TUBAL LIGATION         FAMILY Hx:  Family History   Problem Relation Age of Onset   ??? Cancer Other        HOME MEDICATIONS:  Prior to Admission medications    Medication Sig Start Date End Date Taking? Authorizing Provider   rosuvastatin (CRESTOR) 20 MG tablet Take 20 mg by mouth daily   Yes Historical Provider, MD   HYDROcodone-acetaminophen (NORCO) 7.5-325 MG per tablet Take 1 tablet by mouth every 8 hours as needed for Pain 11/05/14 07/13/20 Yes Jason Sindledecker, APRN - CNP   Psyllium-Calcium (METAMUCIL PLUS CALCIUM PO) Take  by mouth.   Yes Historical Provider, MD   losartan-hydrochlorothiazide (HYZAAR) 50-12.5 MG per tablet Take 1 tablet by mouth daily.   Yes Historical Provider, MD   OMEGA-3 KRILL OIL 300  MG CAPS Take 1 tablet by mouth daily.     Yes Historical Provider, MD   aspirin 325 MG tablet Take 325 mg by mouth daily.     Yes Historical Provider, MD   Multiple Vitamins-Minerals (ONE-A-DAY WOMENS 50 PLUS) TABS Take 1 tablet by mouth daily.     Yes Historical Provider, MD   Flaxseed, Linseed, (FLAX SEED OIL) 1000 MG CAPS Take 1 tablet by mouth daily.     Yes Historical Provider, MD   ibuprofen (ADVIL;MOTRIN) 600 MG tablet Take 1 tablet by mouth every 8 hours as needed for Pain Take with food. 10/05/14 02/02/15  Stephannie Peters, PA   albuterol (PROVENTIL HFA) 108 (90 BASE) MCG/ACT inhaler Inhale 2 puffs into the lungs every 6 hours as needed for Wheezing for 7 days. 02/23/12 03/18/12  Joaquin Courts, DO   Diclofenac Sodium (PENNSAID) 1.5 % SOLN Place 40 drops onto the skin 2 times daily as needed for 30 days. 04/27/11 05/27/11  Nehemiah Settle, APRN - CNS       ALLERGIES:  Cephalexin    SOCIAL Hx:  Social History     Socioeconomic History   ??? Marital status: Married     Spouse name: Besser   ??? Number of children: 2   ??? Years of education: 81   ??? Highest education level: Not on file   Occupational History   ??? Not on file   Tobacco Use   ??? Smoking status: Current Every Day Smoker     Packs/day: 1.00     Years: 30.00     Pack years: 30.00     Types: Cigarettes     Start date: 04/12/1961   ??? Smokeless tobacco: Never Used   Vaping Use   ??? Vaping Use: Never used   Substance and Sexual Activity   ??? Alcohol use: Yes     Comment: Rarely   ??? Drug use: No   ??? Sexual activity: Not Currently   Other Topics Concern   ??? Not on file   Social History Narrative   ??? Not on file     Social Determinants of Health     Financial Resource Strain:    ??? Difficulty of Paying Living Expenses:    Food Insecurity:    ??? Worried About Programme researcher, broadcasting/film/video in the Last Year:    ??? Ran Out of Food in the Last Year:    Transportation Needs:    ??? Freight forwarder (Medical):    ??? Lack of Transportation (Non-Medical):  Physical Activity:    ??? Days  of Exercise per Week:    ??? Minutes of Exercise per Session:    Stress:    ??? Feeling of Stress :    Social Connections:    ??? Frequency of Communication with Friends and Family:    ??? Frequency of Social Gatherings with Friends and Family:    ??? Attends Religious Services:    ??? Database administrator or Organizations:    ??? Attends Engineer, structural:    ??? Marital Status:    Intimate Programme researcher, broadcasting/film/video Violence:    ??? Fear of Current or Ex-Partner:    ??? Emotionally Abused:    ??? Physically Abused:    ??? Sexually Abused:        ROS: Positive in bold  General:   Denies chills, fatigue, fever, malaise, night sweats or weight loss    Psychological:   Denies anxiety, disorientation or hallucinations    ENT:    Denies epistaxis, headaches, vertigo or visual changes    Cardiovascular:   Denies any chest pain, irregular heartbeats, or palpitations. No paroxysmal nocturnal dyspnea.    Respiratory:   Denies shortness of breath, coughing, sputum production, hemoptysis, or wheezing.  No orthopnea.    Gastrointestinal:   Denies nausea, vomiting, diarrhea, or constipation.  Denies any abdominal pain.  Denies change in bowel habits or stools.      Genito-Urinary:    Denies any urgency, frequency, hematuria.  Voiding without difficulty.    Musculoskeletal:   Denies joint pain, joint stiffness, joint swelling or muscle pain    Neurology:    Denies any headache or focal neurological deficits. No weakness or paresthesia.    Derm:    Denies any rashes, ulcers, or excoriations.  Denies bruising.      Extremities:   Denies any lower extremity swelling or edema.      PHYSICAL EXAM: Abnormal findings noted  VITALS:  Vitals:    07/15/20 0349   BP:    Pulse: 79   Resp:    Temp:    SpO2:          CONSTITUTIONAL:    Awake, alert, cooperative, no apparent distress, and appears stated age    EYES:    EOMI, sclera clear, conjunctiva normal    ENT:    Normocephalic, atraumatic. External ears without lesions.     NECK:    Supple, symmetrical, trachea  midline,  no JVD    HEMATOLOGIC/LYMPHATICS:    No cervical lymphadenopathy and no supraclavicular lymphadenopathy    LUNGS:    Symmetric. No increased work of breathing, good air exchange, clear to auscultation bilaterally, no wheezes, rhonchi, or rales,     CARDIOVASCULAR:    Normal apical impulse, regular rate and rhythm, normal S1 and S2, no S3 or S4, and no murmur noted    ABDOMEN:    No scars, normal bowel sounds, soft, non-distended, non-tender    MUSCULOSKELETAL:    There is no redness, warmth, or swelling of the joints.      NEUROLOGIC:    Awake, alert, oriented to name, place and time.      SKIN:    No bruising or bleeding.  No redness, warmth, or swelling    EXTREMITIES:    Peripheral pulses present.  No edema, cyanosis, or swelling.    LINES/CATHETERS     LABORATORY DATA:  CBC with Differential:    Lab Results   Component Value Date  WBC 9.4 07/14/2020    RBC 4.82 07/14/2020    HGB 15.7 07/14/2020    HCT 48.5 07/14/2020    PLT 136 07/14/2020    MCV 100.6 07/14/2020    MCH 32.6 07/14/2020    MCHC 32.4 07/14/2020    RDW 12.8 07/14/2020    LYMPHOPCT 13.3 07/14/2020    MONOPCT 7.5 07/14/2020    BASOPCT 0.3 07/14/2020    MONOSABS 0.70 07/14/2020    LYMPHSABS 1.25 07/14/2020    EOSABS 0.17 07/14/2020    BASOSABS 0.03 07/14/2020     CMP:    Lab Results   Component Value Date    NA 139 07/15/2020    K 4.6 07/15/2020    K 2.9 07/13/2020    CL 105 07/15/2020    CO2 28 07/15/2020    BUN 29 07/15/2020    CREATININE 1.0 07/15/2020    GFRAA >60 07/15/2020    LABGLOM 54 07/15/2020    GLUCOSE 87 07/15/2020    GLUCOSE 87 03/05/2011    PROT 6.4 07/14/2020    LABALBU 3.7 07/14/2020    LABALBU 4.1 03/05/2011    CALCIUM 8.4 07/15/2020    BILITOT 0.7 07/14/2020    ALKPHOS 66 07/14/2020    AST 14 07/14/2020    ALT 14 07/14/2020       ASSESSMENT/PLAN:  1. Syncope and collapse  2. AKI  3. Bacteriuria -less than 10,000 mixed gram-positive organism  4. Acute hypotension with history of hypertension  5. Elevated  troponin  6. Prolonged QTC  7. hyperlipidemia  8. Current tobacco abuse-probable COPD  9. Hypokalemia-resolved      Plan:  Discharge home today    Prescription for Cozaar 25 mg daily-hold Hyzaar  Decrease aspirin 325 mg to one half tab daily    Patient to follow-up with primary care physician in 1 week  Or as directed        Farrel Gobble, DO  2:37 PM  07/15/2020

## 2020-07-15 NOTE — Progress Notes (Signed)
Echo complete.  JS

## 2020-07-15 NOTE — Discharge Instructions (Signed)
Your information:  Name: Jean Ball  DOB: [birth year]    Your instructions:    Discharged home. Please make and keep any follow up appointments. If you have increased shortness of breath, increased cough or sputum production, have increase weakness, become dizzy, fall or pass out, have nausea or vomiting, fever or chills, please call Dr. George Ina or return to the emergency room.     What to do after you leave the hospital:    Recommended diet: regular diet    Recommended activity: activity as tolerated        The following personal items were collected during your admission and were returned to you:    Valuables  Dentures: Lowers, Uppers  Vision - Corrective Lenses: Glasses  Hearing Aid: None  Jewelry: Bracelet (green stones)  Body Piercings Removed: No  Clothing: Footwear, Shirt, Pants, Undergarments (Comment)  Were All Patient Medications Collected?: Not Applicable  Other Valuables: Cell phone, Other (Comment), Crutches (cell phone charger)  Valuables Given To: Family (Comment)  Provide Name(s) of Who Valuable(s) Were Given To: [place]  Responsible person(s) in the waiting room?: jewlry  Patient approves for provider to speak to responsible person post operatively: Yes    Information obtained by:  By signing below, I understand that if any problems occur once I leave the hospital I am to contact Dr. George Ina.  I understand and acknowledge receipt of the instructions indicated above.

## 2020-07-15 NOTE — Care Coordination-Inpatient (Addendum)
07/15/2020 1240 CM note: No covid testing. Met with patient and her husband at bedside for transition of care needs. Pt resides with her spouse Marcello Moores in ranch home,3 step entry with rail. She owns a cane,ww and bsc. Pt is independent with ADLs. PCP is Dr [facility] [employer]) and she uses [facility] [employer]. Plan is for pt to return home upon dc, her husband will provide ride. No needs identified. P.Shadia Larose RN        Advance Care Planning   Healthcare Decision Maker:    Primary Decision Maker: Kama,Thomas - Spouse - [phone]    Click here to complete Healthcare Decision Makers including selection of the Healthcare Decision Maker Relationship (ie "Primary").

## 2020-07-15 NOTE — Progress Notes (Signed)
OCCUPATIONAL THERAPY INITIAL EVALUATION    [facility]  [facility]  [address] [place], [place]. [place]         Date:07/15/2020                                                   Patient Name: Jean Ball     MRN: [id number]     DOB: [birth year]     Room: 0429/0429-01       Evaluating OT: Rolin Barry, OTR/L; [id number]        Specific Provider Orders/Date; Referring Provider:   OT eval and treat Start: 07/13/20 2245, End: 07/13/20 2245, ONE TIME, Standing Count: 1 Occurrences, R    Renard Hamper, DO     Diagnosis:   1. Hypotension, unspecified hypotension type    2. AKI (acute kidney injury) (HCC)    3. Dehydration         Pertinent Medical History:        Past Medical History:   Diagnosis Date   ??? Arthritis    ??? COPD (chronic obstructive pulmonary disease) (HCC)    ??? Hypertension           Past Surgical History:   Procedure Laterality Date   ??? BACK SURGERY  1990    L4-5   ??? CHOLECYSTECTOMY     ??? JOINT REPLACEMENT Left     hip and knee   ??? KNEE SURGERY      x 3 Arthroscopy LEFT   ??? 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 TOTAL KNEE ARTHROPLASTY  2008    [facility]   ??? TUBAL LIGATION         Precautions:  Fall Risk    Recommended placement: home indep    Assessment of current deficits [x]  No deficits    []  Functional mobility  [] ADLs  []  Strength               [] Cognition     []  Functional transfers   []  IADLs         []  Safety Awareness   [] Endurance     []  Fine Coordination              []  Balance      []  Vision/perception   [] Sensation      [] Gross Motor Coordination  []  ROM  []  Delirium                   []  Motor Control     OT PLAN OF CARE   OT POC based on physician orders, patient diagnosis and results of clinical assessment    Frequency/Duration and OT treatment intervention: none recommended       Recommended Adaptive Equipment:  none     Home Living: Pt lives at home with spouse in a ranch home with 3 steps to enter and rail.    Bathroom setup: walk in shower with grab bar and  3:1 inside; standard toilet    Equipment owned: walker     Prior Level of Function: indep with ADLs , indep with IADLs; ambulated indep   Driving: yes   Occupation: retired    Pain Level: none  Cognition: A&O: 4/4; Follows 4 step directions   Memory:  Intact  Sequencing: Intact    Problem solving:Intact    Judgement/safety:Intact     AM-PAC Daily Activity Inpatient   How much help for putting on and taking off regular lower body clothing?: None  How much help for Bathing?: None  How much help for Toileting?: None  How much help for putting on and taking off regular upper body clothing?: None  How much help for taking care of personal grooming?: None  How much help for eating meals?: None  AM-PAC Inpatient Daily Activity Raw Score: 24  AM-PAC Inpatient ADL T-Scale Score : 57.54  ADL Inpatient CMS 0-100% Score: 0  ADL Inpatient CMS G-Code Modifier : CH     Functional Assessment:    Initial Eval Status  Date: 07/15/2020  Treatment Status  Date: STGs = LTGs  Time frame: 10-14 days   Feeding Indep  NA-PLOF   Grooming Indep  NA-PLOF   UB Dressing Indep  NA-PLOF   LB Dressing Indep  NA-PLOF   Bathing Indep  NA-PLOF   Toileting Indep   NA-PLOF   Bed Mobility  Supine to sit: Indep  Sit to supine: Indep   NA-PLOF   Functional Transfers Indep with walker from bed, arm chair and toilet  NA-PLOF   Functional Mobility Indep with walker for short distance functional mobility throughout the room to simulate house hold distances.    NA-PLOF   Balance Sitting:     Static:  Good    Dynamic: Good  Standing: Good  NA-PLOF   Activity Tolerance Room air with good tolerance to ADLs  NA-PLOF   Visual/  Perceptual Vision: WFL; Glasses present: yes: Change in vision since admission: no     NA-PLOF           Hand Dominance  [x]  Right []  Left     AROM (PROM) Strength Additional Info:    RUE  WFL 5/5 good grip and wfl FMC/dexterity noted during ADL tasks  Opposition [x]  Intact []  Impaired  Finger to nose [x]  Intact []  Impaired     LUE WFL 5/5  good grip and wfl FMC/dexterity noted during ADL tasks  Opposition [x]  Intact []  Impaired  Finger to nose [x]  Intact []  Impaired     Hearing: WFL   Sensation:  No c/o numbness or tingling   Tone: WFL   Edema: none    Comments: Upon arrival patient sitting EOB and requesting to complete ADL.  Overall patient demonstrated independence and safety during completion of ADL/functional transfer/mobility tasks that matches that of PLOF.  At end of session, patient sitting in arm chair with call light and phone within reach, all lines and tubes intact. Pt would not benefit from continued skilled OT services at this time due to intact safety and independence with completion of ADL/IADL tasks for functional independence and quality of life at [facility].       Patient / Family Goal: Return home today.       Patient and/or family were instructed on functional diagnosis, prognosis/goals and OT plan of care. Demonstrated good understanding.     Eval Complexity: Low  ?? History: Brief review of medical records and additional review of physical, cognitive, or psychosocial history related to current functional performance  ?? Exam: No performance deficits  ?? Assistance/Modification: No assistance or modifications required to perform tasks. May have comorbidities that affect occupational performance.    Time In: 1029  Time Out: 1048  Total Treatment Time: 0    Min Units   OT  Eval Low 97165  x  1   OT Eval Medium 97166      OT Eval High 35009      OT Re-Eval [id number]       Therapeutic Ex [id number]       Therapeutic Activities 97530       ADL/Self Care 97535       Orthotic Management 97760       Manual 97140     Neuro Re-Ed 97112       Non-Billable Time          Evaluation Time additionally includes thorough review of current medical information, gathering information on past medical history/social history and prior level of function, interpretation of standardized testing/informal observation of tasks, assessment of data and development of plan of  care and goals.            Godson Pollan OTR/L; [id number]

## 2020-07-16 LAB — CULTURE, URINE: Urine Culture, Routine: <10000

## 2020-07-17 ENCOUNTER — Ambulatory Visit (INDEPENDENT_AMBULATORY_CARE_PROVIDER_SITE_OTHER): Payer: Medicare Other | Admitting: Internal Medicine

## 2020-07-17 ENCOUNTER — Other Ambulatory Visit: Payer: Self-pay

## 2020-07-17 VITALS — BP 121/77 | HR 80 | Temp 98.2°F | Wt 92.8 lb

## 2020-07-17 DIAGNOSIS — J449 Chronic obstructive pulmonary disease, unspecified: Secondary | ICD-10-CM

## 2020-07-17 DIAGNOSIS — F17210 Nicotine dependence, cigarettes, uncomplicated: Secondary | ICD-10-CM | POA: Diagnosis not present

## 2020-07-17 DIAGNOSIS — I872 Venous insufficiency (chronic) (peripheral): Secondary | ICD-10-CM

## 2020-07-17 DIAGNOSIS — E46 Unspecified protein-calorie malnutrition: Secondary | ICD-10-CM

## 2020-07-17 DIAGNOSIS — F17200 Nicotine dependence, unspecified, uncomplicated: Secondary | ICD-10-CM

## 2020-07-17 MED ORDER — ALBUTEROL SULFATE HFA 108 (90 BASE) MCG/ACT IN AERS: 2.0000 | INHALATION_SPRAY | Freq: Four times a day (QID) | RESPIRATORY_TRACT | 1 refills | Status: DC | PRN

## 2020-07-17 NOTE — Progress Notes (Signed)
CC: COPD, weight loss, and decreased appetite, and chronic venous insufficiency  HPI:  [name] is a 74 y.o. history of COPD presenting for follow up a recent hospitalization and concerns about weight loss, COPD, and chronic venous insufficiency.   Past Medical History:  Diagnosis Date  . Cellulitis   . COPD (chronic obstructive pulmonary disease) ([place])    Review of Systems:   Constitutional: Negative for chills and fever.  Respiratory: Negative for shortness of breath.   Cardiovascular: Negative for chest pain and leg swelling.  Musculoskeletal: Positive for bilateral lower extremity wounds and lower extremity pain. Gastrointestinal: Negative for abdominal pain and vomiting.  Positive for weight loss, decreased appetite, nausea. Neurological: Negative for dizziness and headaches.   Physical Exam:  Vitals:   07/17/20 1328  BP: 121/77  Pulse: 80  Temp: 98.2 F (36.8 C)  TempSrc: Oral  SpO2: 96%  Weight: 92 lb 12.8 oz (42.1 kg)   Physical Exam Constitutional:      Comments: Cachectic  HENT:     Head: Normocephalic and atraumatic.  Cardiovascular:     Rate and Rhythm: Normal rate and regular rhythm.     Pulses: Normal pulses.     Heart sounds: Normal heart sounds.  Pulmonary:     Effort: Pulmonary effort is normal.     Comments: Decreased breath sounds. On RA Abdominal:     General: Bowel sounds are normal.     Palpations: Abdomen is soft.  Skin:    General: Skin is warm and dry.     Capillary Refill: Capillary refill takes less than 2 seconds.     Comments: BL LE swelling and venous stasis changes, wounds noted on medial ankle, healthy granulation tissue noted, no drainage or bleeding noted  Neurological:     General: No focal deficit present.     Mental Status: She is alert and oriented to person, place, and time.  Psychiatric:        Mood and Affect: Mood normal.        Behavior: Behavior normal.    Assessment & Plan:   See Encounters Tab for  problem based charting.  Patient discussed with Dr. Jimmye Norman

## 2020-07-17 NOTE — Patient Instructions (Addendum)
[name],  It was a pleasure to see you today. Thank you for coming in.   Today we discussed your COPD. In regards to this please continue taking the [facility] daily. I have refilled the albuterol that you can take as needed. Continue on the oxygen at night and as needed.    We also discussed your chronic leg wounds. Please continue the wound care that you have been doing. Please continue taking the tylenol and aleve as needed for pain. You can continue taking the Hydrocodone for the pain, please follow up with your doctor in [facility] to discuss changing this.   We also discussed your decreased appetite and weight loss. Please continue eating a healthy diet and discussing with the nutritionist. Try eating small meals if you get full too quickly. Please follow up with your primary care provider and make sure you are up to date on all your health maintenance issues.  Try to work on stopping smoking. Try using the patch and the lozenges. This may help with your decreased appetite too.   Please return to clinic as needed.   Thank you again for coming in.   [name].

## 2020-07-18 ENCOUNTER — Ambulatory Visit: Payer: Medicare Other | Admitting: *Deleted

## 2020-07-18 DIAGNOSIS — I872 Venous insufficiency (chronic) (peripheral): Secondary | ICD-10-CM | POA: Insufficient documentation

## 2020-07-18 DIAGNOSIS — J449 Chronic obstructive pulmonary disease, unspecified: Secondary | ICD-10-CM

## 2020-07-18 DIAGNOSIS — F17200 Nicotine dependence, unspecified, uncomplicated: Secondary | ICD-10-CM | POA: Insufficient documentation

## 2020-07-18 NOTE — Assessment & Plan Note (Signed)
Patient reports that she has had a significant weight loss over the past year, endorses early satiety and decreased p.o. intake.  She reports she has lost quite a lot of weight.  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  She is currently on hydrocodone as needed for her chronic venous ulcers, she reports that this medication is making her nauseous and is requesting an adjustment in this.  She denied any fevers, chills, vomiting, chest pain, lightheadedness, dizziness, or abdominal pain.  On exam she appears severely cachectic, BMI is 13.  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  We discussed that she should continue to try eating a healthy diet, continue using supplements such as Ensure, and that she will need to follow-up with her primary care provider to further discuss this.  Overall appears to have a poor prognosis. -Advised to continue using nutrition supplements such as Ensure

## 2020-07-18 NOTE — Assessment & Plan Note (Signed)
Patient reports she is still smoking half pack per day, she has not picked up the patches yet however states that she will pick them up today, she was also going to try the lozenges to see if that helps.  Encourage patient to pick up the nicotine patches.  Advised on smoking cessation benefits with her COPD.

## 2020-07-18 NOTE — Progress Notes (Signed)
Internal Medicine Clinic Resident  I have personally reviewed this encounter including the documentation in this note and/or discussed this patient with the care management provider. I will address any urgent items identified by the care management provider and will communicate my actions to the patient's PCP. I have reviewed the patient's CCM visit with my supervising attending, Dr Evette Doffing.  Jose Persia, MD 07/18/2020

## 2020-07-18 NOTE — Assessment & Plan Note (Signed)
Patient was admitted from 06/28/2020 to 07/03/2020 for a COPD exacerbation.  She was noted to have increasing shortness of breath and noted to be hypoxic down to 79% on room air, chest x-ray showed bilateral pleural effusion and bilateral interstitial prominence.  COVID-19 negative, RVP positive for coronavirus.  She completed a course of prednisone, azithromycin, and breathing treatments with improvement of her respiratory status.  She was discharged home on 2 L nasal cannula, as well as albuterol and Dulera inhalers.  Patient is currently on Dulera daily and albuterol as needed, she reports using her albuterol about 2-3 times per day.  Reports that she feels her breathing is doing better, she currently still uses 2 L nasal cannula as needed or at night, is not currently on any oxygen at this time.  Reports some mild shortness of breath.  She denies any fevers, chills, vomiting, chest pain, headaches, lightheadedness, or dizziness.  She reports some mild nausea.  On exam she is oxygenating well, oxygen saturations stayed above 88% with ambulation, pulmonary exam showed decreased lung sounds, and no acute distress.  Overall COPD appears to be stable at this time.  Patient needing refill on her albuterol inhaler. -Continue Dulera daily -Continue albuterol as needed, refilled this -Advised patient that she does not need to use her oxygen at home, however if she develops significant shortness of breath and she can resume it

## 2020-07-18 NOTE — Chronic Care Management (AMB) (Signed)
  Chronic Care Management   Note  07/18/2020 Name: Kristen [name] MRN: [id number] DOB: 07/18/1946   Successful outreach to patient to follow up on securing home health services for patient.  She was hospitalized at [facility] for COPD exacerbation from 8/6-8/11 and discharged to sister's home on O2 at 2L/min. She states she is using the O2 during the night and prn during the day. In describing the O2 equipment she was issued in the hospital, she did not describe an O2 concentrator.  Upon discharge from the hospital, home health services of PT/OT were arranged with [name], however when patient was seen in the Internal Medicine Center on 8/25, she reported to the provider that she had not heard from the home health agency. The provider then placed a referral to this CCM RN. Spoke with [place] at [place], [phone], she states several attempts were made to reach patient and messages left . Case was changed to non admit on 07/16/20 when patient did not return call to [facility].  When speaking with patient today, she denies receiving calls from [facility]. Says she is temporarily staying with her sister in [place] until she is well enough to return to [facility] [place]. where she resides.   Interventions Call  placed to [facility] and they will re-open referral with services to start the week of 07/22/20. Notified Skeet Latch, [employer] representative, of need to deliver O2 concentrator to patient at sister's residence at [place] [phone].Marland Kitchen  Patient notified.  Follow up plan: No further CCM  follow up required: as patient will return to her residence in [place], [place]. when she has recovered.  Kelli Churn RN, CCM, [facility] RN Care Manager [phone]

## 2020-07-18 NOTE — Assessment & Plan Note (Signed)
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  She is currently on Norco 10-_0 tablet every 8 as needed, she states that she has been on this for a while however recently it has been making her more nauseous, was requesting an adjustment in this medication.  She was supposed to have home health set up however she reports nobody has called her.  The last social work note during hospitalization noted [facility] at [facility], unclear if home health was set up in this area.  Patient reports that she will be staying here for an unknown period of time.  Will consult CCM to assist with setting up home health services.  Also discussed her pain management, discussed that is a chronic condition that she should follow-up with her primary care provider to make adjustments to pain medications.   -Continue Norco PRN -CCM consulted

## 2020-07-19 LAB — CULTURE, BLOOD 1: Blood Culture, Routine: 5

## 2020-07-19 LAB — CULTURE, BLOOD 2: Culture, Blood 2: 5

## 2020-07-21 NOTE — Progress Notes (Signed)
Internal Medicine Clinic Attending  CCM services provided by the care management provider and their documentation were discussed with Dr. Charleen Kirks. We reviewed the pertinent findings, urgent action items addressed by the resident and non-urgent items to be addressed by the PCP.  I agree with the assessment, diagnosis, and plan of care documented in the CCM and resident's note.  Axel Filler, MD 07/21/2020

## 2020-07-22 NOTE — Progress Notes (Signed)
Internal Medicine Clinic Attending  Case discussed on DOS 06/2520 with Dr. Sherry Ruffing at the time of the visit.  We reviewed the resident's history and exam and pertinent patient test results.  I agree with the assessment, diagnosis, and plan of care documented in the resident's note. Frustrating that connection wasn't made between [facility] therapy and patient who is staying locally during recovery.  CCM will help troubleshoot.  Kristen [name] has been encouraged to f/u with her PCP as soon as she returns home to further discuss her weight loss (and prognosis for her chronic lung disease).  She is currently in [place] visiting relatives.

## 2020-07-23 ENCOUNTER — Telehealth: Payer: Self-pay | Admitting: Internal Medicine

## 2020-07-23 NOTE — Telephone Encounter (Signed)
[facility] Nurse requesting a call back.

## 2020-07-23 NOTE — Telephone Encounter (Signed)
rtc to [facility] At this point [facility] does not know how long pt will be in [place] If she stays for 86mo to 1 yr, [facility] Pt has c/o hydrocodone not being effective, states she prefers percocet.  VO given for 1x week for 8 weeks, will call back tomorrow after speaking w/ pt Do you agree?

## 2020-08-27 ENCOUNTER — Ambulatory Visit (INDEPENDENT_AMBULATORY_CARE_PROVIDER_SITE_OTHER): Payer: Medicare Other | Admitting: Student

## 2020-08-27 ENCOUNTER — Encounter: Payer: Self-pay | Admitting: Student

## 2020-08-27 ENCOUNTER — Other Ambulatory Visit: Payer: Self-pay

## 2020-08-27 VITALS — BP 169/97 | HR 84 | Temp 98.0°F | Wt 93.2 lb

## 2020-08-27 DIAGNOSIS — F17210 Nicotine dependence, cigarettes, uncomplicated: Secondary | ICD-10-CM | POA: Diagnosis not present

## 2020-08-27 DIAGNOSIS — F17200 Nicotine dependence, unspecified, uncomplicated: Secondary | ICD-10-CM

## 2020-08-27 DIAGNOSIS — F411 Generalized anxiety disorder: Secondary | ICD-10-CM | POA: Insufficient documentation

## 2020-08-27 DIAGNOSIS — J449 Chronic obstructive pulmonary disease, unspecified: Secondary | ICD-10-CM

## 2020-08-27 DIAGNOSIS — I872 Venous insufficiency (chronic) (peripheral): Secondary | ICD-10-CM

## 2020-08-27 DIAGNOSIS — Z23 Encounter for immunization: Secondary | ICD-10-CM | POA: Diagnosis not present

## 2020-08-27 DIAGNOSIS — R64 Cachexia: Secondary | ICD-10-CM | POA: Diagnosis not present

## 2020-08-27 MED ORDER — HYDROCODONE-ACETAMINOPHEN 10-325 MG PO TABS: 1.0000 | ORAL_TABLET | Freq: Three times a day (TID) | ORAL | 0 refills | Status: DC | PRN

## 2020-08-27 MED ORDER — BUSPIRONE HCL 10 MG PO TABS: 10.0000 mg | ORAL_TABLET | Freq: Every day | ORAL | 2 refills | Status: DC | PRN

## 2020-08-27 NOTE — Assessment & Plan Note (Addendum)
Patient continues to have chronic venous insufficiency ulcers on the medial aspect of her foot that are healing.  She received an Unna wrap from home health that has helped with her wound healing.  She is also able to wrap the wound herself.  Patient states she is out of her Norco and her PCP in [facility] was unable to fill it across state lines.  Patient states she plans to stay in [place] until [place] pandemic has resolved before going back to [facility].  Patient was seen by CCM but they are not currently following patient due to patient's plan to return home to [facility].  Plan --Refilled Norco 10-325 mg for 30-day supply --Continue wound care with Unna wrap --CCM follow-up as needed

## 2020-08-27 NOTE — Patient Instructions (Signed)
Thank you, [name] for allowing [name] to provide your care today. Today we discussed your COPD, smoking and venous stasis wounds.  We will send you a refill of your BuSpar and your Norco.  Please pick this up at your local pharmacy.  Continue cutting down on your smoking.   I have ordered the following medication/changed the following medications:  1. Refilled BuSpar 10 mg daily 2. Refilled your Norco 10-325 mg every 8 hours as needed for pain  Please follow-up as needed  Should you have any questions or concerns please call the internal medicine clinic at [phone].    Linwood Dibbles, MD, MPH [facility]   My Chart Access: [email or web address]   If you have not already done so, please get your COVID 19 vaccine  To schedule an appointment for a COVID vaccine choice any of the following: Go to [email or web address]   Go to [email or web address]                  Call [phone]                                     Call [phone] and select Option 2

## 2020-08-27 NOTE — Telephone Encounter (Signed)
Patient is coming in for appt and we will address these issues.  It was our understanding that Kristen [name] would be returning to her home and PCP in [facility].

## 2020-08-27 NOTE — Assessment & Plan Note (Addendum)
Patient states she is down to 2-3 cigarettes/day from half a pack/day. States she could not afford the nicotine patch so she called the helpline. They sent her some nicotine patch, lozenges and even offered counseling. Patient states the patch gives her nightmares so she has not been using. Patient states she quit before in the past and plans to quit on her own again but she knows it takes some time.  Commended patient for cutting down and encouraged to continue her efforts to stop smoking altogether.

## 2020-08-27 NOTE — Assessment & Plan Note (Signed)
Patient with a history of generalized anxiety disorder that was being managed by her PCP in [facility].  Patient has been on BuSpar 10 mg daily as needed for anxiety.  States she is out of her BuSpar today.  Patient talkative but did not seem anxious on exam.  Plan: --Refill of BuSpar 10 mg daily as needed for anxiety --Follow-up with PCP

## 2020-08-27 NOTE — Assessment & Plan Note (Signed)
Secondary to end-stage COPD. Patient COPD is being managed but but has significant malnutrition due to increased catabolism. States she has a eating a lot of [place] foods cooked by her sister.  Patient BMI significantly underweight at 13.37 kg/m but has gained 1 pound in the last month. Patient is encouraged to supplement meals with Ensure.

## 2020-08-27 NOTE — Progress Notes (Signed)
CC: Medication refill  HPI:  [name] is a 74 y.o. female with PMH of COPD, malnutrition, anxiety and venous stasis dermatitis who presented for medication refill.  Patient endorses bilateral leg pains due to her venous stasis dermatitis but states this is chronic and she has been out of her Norco.  Patient endorsed some mild dyspnea on exertion that is being managed with her Dulera and albuterol.  Patient denies any chest pain, palpitations, headaches, N/V, dizziness or numbness and tingling.   Of note, patient states multiple family members have contracted Covid.  She is afraid to go back to her home at [facility] at the moment due to the rising Covid cases there.   Please see problem based charting for evaluation, assessment and plan.  Past Medical History:  Diagnosis Date  . Cellulitis   . Chronic venous stasis dermatitis of both lower extremities    Chronic problem that was being manged by PCP in [place]  . COPD (chronic obstructive pulmonary disease) ([place])   . 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 No acute distress. Head: Normocephalic, atraumatic w/o tenderness Cardiac: RRR. No murmurs, rubs or gallops. S1, S2. No lower extremities edema Respiratory: Lungs CTAB. Decreased air movement.  No wheezing or crackles. No increased WOB Abdominal: Soft, symmetric and non tender. No organomegaly. Normal bowel sounds Skin: Warm, dry and intact without rashes or lesions Extremities: Atraumatic. Full ROM.  Bilateral venous stasis wounds wrapped with a Unna wrap. Neuro: A&O x 3. Moves all extremities.  Normal sensation. Psych: Appropriate mood and affect. Normal judgement  Vitals:   08/27/20 1413  BP: (!) 169/97  Pulse: 84  Temp: 98 F (36.7 C)  TempSrc: Oral  SpO2: 98%  Weight: 93 lb 3.2 oz (42.3 kg)     Assessment & Plan:   See Encounters Tab for problem based charting.  Patient seen with Dr. Louann Liv, MD, MPH

## 2020-08-27 NOTE — Assessment & Plan Note (Addendum)
Stable. Patient denies any dyspnea or shortness of breath. Patient states she is on 2 L of O2 at night and occasionally during the day. Patient is on Dulera daily and albuterol as needed. Uses her albuterol about 2-3 times daily. Lung exam significant for decreased breath sounds but no wheezing or rhonchi and no increased work of breathing. --Continue on Dulera daily --Continue albuterol as needed for shortness of breath or wheezing --Patient down to 2-3 cigarettes/day. Continue to encourage smoking cessation.

## 2020-08-27 NOTE — Progress Notes (Signed)
Internal Medicine Clinic Attending  I saw and evaluated the patient.  I personally confirmed the key portions of the history and exam documented by Dr. Coy Saunas and I reviewed pertinent patient test results.  The assessment, diagnosis, and plan were formulated together and I agree with the documentation in the resident's note.

## 2020-08-29 ENCOUNTER — Telehealth: Payer: Self-pay

## 2020-08-29 NOTE — Telephone Encounter (Signed)
Requesting a refill on nebulizer solution @  [id number] [place] [facility] [address] Phone:  [phone] Fax:  [phone]    Please call pt back.

## 2020-08-30 NOTE — Telephone Encounter (Signed)
Returned call to patient. Requesting refill on albuterol/Ipratropium bromide for nebulizer. States this helps her more than the MDI and helps her to need her O2 less often. Will forward request to [place], BSN, RN-BC

## 2020-09-02 ENCOUNTER — Other Ambulatory Visit: Payer: Self-pay | Admitting: Student

## 2020-09-02 MED ORDER — IPRATROPIUM-ALBUTEROL 0.5-2.5 (3) MG/3ML IN SOLN: 3.0000 mL | Freq: Four times a day (QID) | RESPIRATORY_TRACT | 1 refills | Status: DC | PRN

## 2020-09-02 NOTE — Progress Notes (Signed)
Refilled Duoneb nebulizer. Patient states this helps her more than the inhalers.

## 2020-09-17 ENCOUNTER — Telehealth: Payer: Self-pay | Admitting: *Deleted

## 2020-09-17 ENCOUNTER — Other Ambulatory Visit: Payer: Self-pay

## 2020-09-17 ENCOUNTER — Ambulatory Visit: Payer: Medicare Other | Admitting: Internal Medicine

## 2020-09-17 DIAGNOSIS — I872 Venous insufficiency (chronic) (peripheral): Secondary | ICD-10-CM

## 2020-09-17 NOTE — Progress Notes (Signed)
I connected with  Kristen [name] on 09/19/20 by a video enabled telemedicine application and verified that I am speaking with the correct person using two identifiers.   I discussed the limitations of evaluation and management by telemedicine. The patient expressed understanding and agreed to proceed.    CC: LE pain   This is a telephone encounter between Kristen [name] and Kristen [name] on 09/19/2020 for LE pain. The visit was conducted with the patient located at home and Kristen [name] at [facility]. The patient's identity was confirmed using their DOB and current address. The patient has consented to being evaluated through a telephone encounter and understands the associated risks (an examination cannot be done and the patient may need to come in for an appointment) / benefits (allows the patient to remain at home, decreasing exposure to coronavirus). I personally spent 12 minutes on medical discussion.   HPI:  [name] is a 74 y.o. with PMH as below.   Please see A&P for assessment of the patient's acute and chronic medical conditions.   Past Medical History:  Diagnosis Date  . Cellulitis   . Chronic venous stasis dermatitis of both lower extremities    Chronic problem that was being manged by PCP in [place]  . COPD (chronic obstructive pulmonary disease) ([place])   . 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  Patient discussed with Dr. Evette Doffing

## 2020-09-17 NOTE — Telephone Encounter (Signed)
Patient called in stating she is in "severe pain" at the site of ulcers on both legs and both ankles. No relief with hydrocodone or ibuprofen 800 mg. States the only thing that worked is the percocet 5 mg q 4 h she received in the hospital. Given tele appt today with [facility]. Hubbard Hartshorn, BSN, RN-BC

## 2020-09-18 ENCOUNTER — Ambulatory Visit (INDEPENDENT_AMBULATORY_CARE_PROVIDER_SITE_OTHER): Payer: Medicare Other | Admitting: Internal Medicine

## 2020-09-18 VITALS — BP 151/79 | HR 88 | Temp 98.4°F | Wt 95.5 lb

## 2020-09-18 DIAGNOSIS — I872 Venous insufficiency (chronic) (peripheral): Secondary | ICD-10-CM

## 2020-09-18 NOTE — Progress Notes (Signed)
CC: Lower extremity pain secondary to venous insufficiency  HPI:  Kristen [name] is a 74 y.o. female with a past medical history stated below and presents today for lower extremity pain secondary to venous insufficiency. Please see problem based assessment and plan for additional details.  Past Medical History:  Diagnosis Date  . Cellulitis   . Chronic venous stasis dermatitis of both lower extremities    Chronic problem that was being manged by PCP in [place]  . COPD (chronic obstructive pulmonary disease) ([place])   . 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 albuterol (VENTOLIN HFA) 108 (90 Base) MCG/ACT inhaler Inhale 2 puffs into the lungs every 6 (six) hours as needed for wheezing or shortness of breath. 8 g 1  . busPIRone (BUSPAR) 10 MG tablet Take 1 tablet (10 mg total) by mouth daily as needed (anxiety). 30 tablet 2  . fluticasone (FLONASE) 50 MCG/ACT nasal spray Place 1 spray into both nostrils daily as needed for allergies or rhinitis.    Marland Kitchen guaiFENesin (MUCINEX) 600 MG 12 hr tablet Take 1 tablet (600 mg total) by mouth 2 (two) times daily. 60 tablet 0  . HYDROcodone-acetaminophen (NORCO) 10-325 MG tablet Take 1 tablet by mouth every 8 (eight) hours as needed for moderate pain. 90 tablet 0  . Ibuprofen-Acetaminophen 125-250 MG TABS Take 1 tablet by mouth 2 (two) times daily as needed (pain/headache).    Marland Kitchen ipratropium-albuterol (DUONEB) 0.5-2.5 (3) MG/3ML SOLN Take 3 mLs by nebulization every 6 (six) hours as needed. 360 mL 1  . mometasone-formoterol (DULERA) 200-5 MCG/ACT AERO Inhale 1 puff into the lungs 2 (two) times daily. 13 g 0  . Multiple Vitamin (MULTIVITAMIN WITH MINERALS) TABS tablet Take 1 tablet by mouth daily.    . nicotine (NICODERM CQ - DOSED IN MG/24 HOURS) 21 mg/24hr patch Place 1 patch (21 mg total) onto the skin daily. 28 patch 0  . potassium chloride (KLOR-CON) 10  MEQ tablet Take 1 tablet (10 mEq total) by mouth daily. 30 tablet 0   No current facility-administered medications on file prior to visit.    Family History  Problem Relation Age of Onset  . Diabetes Mother   . Diabetes Father   . Diverticulitis Sister     Social History   Socioeconomic History  . Marital status: Divorced    Spouse name: Not on file  . Number of children: Not on file  . Years of education: Not on file  . Highest education level: Not on file  Occupational History  . Not on file  Tobacco Use  . Smoking status: Current Every Day Smoker    Packs/day: 0.50    Types: Cigarettes  . Smokeless tobacco: Never Used  . Tobacco comment: 2-3 per day   Substance and Sexual Activity  . Alcohol use: Yes    Comment: occasionally  . Drug use: Never  . Sexual activity: Not on file  Other Topics Concern  . Not on file  Social History Narrative  . Not on file   Social Determinants of Health   Financial Resource Strain:   . Difficulty of Paying Living Expenses: Not on file  Food Insecurity:   . Worried About Charity fundraiser in the Last Year: Not on file  . Ran Out of Food in the Last Year: Not on file  Transportation Needs:   . Lack of Transportation (  Medical): Not on file  . Lack of Transportation (Non-Medical): Not on file  Physical Activity:   . Days of Exercise per Week: Not on file  . Minutes of Exercise per Session: Not on file  Stress:   . Feeling of Stress : Not on file  Social Connections:   . Frequency of Communication with Friends and Family: Not on file  . Frequency of Social Gatherings with Friends and Family: Not on file  . Attends Religious Services: Not on file  . Active Member of Clubs or Organizations: Not on file  . Attends Archivist Meetings: Not on file  . Marital Status: Not on file  Intimate Partner Violence:   . Fear of Current or Ex-Partner: Not on file  . Emotionally Abused: Not on file  . Physically Abused: Not on file   . Sexually Abused: Not on file    Review of Systems: ROS negative except for what is noted on the assessment and plan.  Vitals:   09/18/20 1523  BP: (!) 151/79  Pulse: 88  Temp: 98.4 F (36.9 C)  TempSrc: Oral  SpO2: 97%  Weight: 95 lb 8 oz (43.3 kg)     Physical Exam: Physical Exam Constitutional:      Appearance: Normal appearance.  HENT:     Head: Normocephalic and atraumatic.  Cardiovascular:     Rate and Rhythm: Normal rate.     Pulses: Normal pulses.     Heart sounds: Normal heart sounds.  Pulmonary:     Effort: Pulmonary effort is normal.  Musculoskeletal:        General: Normal range of motion.     Cervical back: Normal range of motion.     Comments: Minimal swelling in bilateral lower extremities.  Skin:    General: Skin is warm and dry.     Findings: Rash (stasis dermatitis present bilateral lower extremities) present.     Comments: Feet and ankles were wrapped today.  Neurological:     General: No focal deficit present.     Mental Status: She is alert and oriented to person, place, and time. Mental status is at baseline.     Sensory: No sensory deficit.     Motor: No weakness.  Psychiatric:        Mood and Affect: Mood normal.      Assessment & Plan:   See Encounters Tab for problem based charting.  Patient discussed with Dr. Hilbert Odor, D.O. [facility], PGY-2 Pager: [phone], Phone: [phone] Date 09/20/2020 Time 6:54 AM

## 2020-09-18 NOTE — Patient Instructions (Addendum)
Thank you, [name] for allowing [name] to provide your care today. Today we discussed venous insufficiency pain.  I have ordered the following labs for you:  Lab Orders  No laboratory test(s) ordered today     Tests ordered today:  none  Referrals ordered today:   Referral Orders  No referral(s) requested today     I have ordered the following medication/changed the following medications:   Stop the following medications: There are no discontinued medications.   Start the following medications: 1. Max dose of tylenol is 2000 mg daily  2. Lidocaine patch    Follow up: 1 month    Remember:  Should you have any questions or concerns please call the internal medicine clinic at [phone].     Marianna Payment, D.O. [place]

## 2020-09-19 ENCOUNTER — Encounter: Payer: Self-pay | Admitting: Internal Medicine

## 2020-09-19 ENCOUNTER — Telehealth: Payer: Self-pay

## 2020-09-19 MED ORDER — HYDROCODONE-ACETAMINOPHEN 10-325 MG PO TABS: 1.0000 | ORAL_TABLET | Freq: Three times a day (TID) | ORAL | 0 refills | Status: DC | PRN

## 2020-09-19 NOTE — Telephone Encounter (Signed)
Pt states she cannot get HYDROcodone-acetaminophen (NORCO) 10-325 MG tablet. The pharmacy told her it's too early for a refill. Please call pt back. [facility]  [id number] [place] [facility] [address] Phone:  [phone] Fax:  [phone]

## 2020-09-19 NOTE — Assessment & Plan Note (Signed)
Patient presents for televisit for reevaluation of lower extremity venous insufficiency.  Patient states that this evening she is noticed significantly more pain in her lower extremities.  She states the pain is in her lower legs and feels as a throbbing pain particularly when she is trying to relax and lay down.  She denies any inciting events but believes that increased activity may have precipitated her increased pain patient states that she has minimal swelling in her legs currently but her ankles are wrapped due to chronic ongoing venous ulcers.  Most recently wrapped on Monday (10/25) by home health.  Patient is taking Norco 10-325 mg every 8 hours for pain but states that this is ineffective at treating her pain.  She states that she is able to handle all of her ADLs.  She denies any fever, increased swelling, worsening erythema in her legs, pruritus.  She states that her lower extremities feel warm and dry otherwise. Patient was recently established at our practice after visiting family in [place] and a recent hospitalization.  Therefore, we do not have extensive past medical records for her.  Patient feels as though she needs additional pain medication to get her through her acute pain.  She states that she still has a few pills of her Norco pain medicine.  I advised her to continue taking this medication tonight and come to our office tomorrow for reevaluation.  Considering patient's significant history of chronic lower extremity venous ulcers, would like to reevaluate her in the office tomorrow for worsening lower extremity infection versus possible peripheral artery disease as an underlying issue for her acute flare.  Plan: -Continue current pain management -Reevaluate tomorrow.

## 2020-09-19 NOTE — Telephone Encounter (Signed)
Just called in new prescription after we spoke to hold her over until her next refill.

## 2020-09-19 NOTE — Progress Notes (Signed)
Internal Medicine Clinic Attending  Case discussed with Dr. Marianna Payment  At the time of the visit.  We reviewed the resident's history and exam and pertinent patient test results.  I agree with the assessment, diagnosis, and plan of care documented in the resident's note.

## 2020-09-19 NOTE — Telephone Encounter (Signed)
Spoke with Cristie Hem at [facility]. States earliest patient can get refill on hydrocodone is 09/24/2020. Picked up last Rx on 08/27/2020. Hubbard Hartshorn, BSN, RN-BC

## 2020-09-20 ENCOUNTER — Encounter: Payer: Self-pay | Admitting: Internal Medicine

## 2020-09-20 NOTE — Assessment & Plan Note (Addendum)
Patient presents today for further evaluation and management of her lower extremity pain secondary to venous insufficiency.  Patient states that she has pain from her feet to her mid shins that she describes as a throbbing pain.  States the pain is worse with activity but often times will make it difficult to rest.  She denies worsening pain with elevation of her legs.  She admits to having an ankle-brachial index in the past that was within normal limits as well as having of thorough work-up with a pain specialist with what sounds like sclerotherapy in the past.  Patient states that she is reasonably functional the majority of each day and is able to complete her ADLs without issue.  Her pain is exacerbated roughly 1-2 times a week.  During these times she attributes increased activity as the trigger but denies that the pain interferes with her ability to take care of herself.  Patient is currently taking hydrocodone-acetaminophen 10-325 mg.  She was originally treated by another doctor in [facility] but recently moved to [place] to be closer to family.  She states that she recently ran out of her medication earlier this month due to increased pain.  On evaluation patient is a frail-appearing woman who appears comfortable on evaluation today.  She has  minimal swelling in her lower extremities with evidence of venous stasis dermatitis to her mid shins bilaterally.  Patient's lower ankles are wrapped due to chronic venous ulcers on the medial aspect of both ankles.  She has good pulses in her lower extremities particularly at the popliteal and dorsalis pedis.It was difficult to palpate posterior tibialis secondary to lower extremity wraps.  Furthermore patient had intact sensation in her lower extremities bilaterally.  These wounds are not tender to palpation.  Patient was able to ambulate without issue.    Patient is requesting stronger pain medicine today to alleviate her intractable pain.  Considering  the patient is afrail 74 year old who is functional at baseline, I do not believe she needs increased pain medication to deal with her lower extremity venous insufficiency.  Alternatively, I believe the patient would benefit from titrating down her hydrocodone to 5 mg every 8 hours.  Concerned that the patient is at high risk for fall and subsequent complications.  I counseled the patient regarding the risk and benefit of pain medicine in her situation and agreed to not make any medication changes today.  I counseled her on using Tylenol to supplement her pain management on days in which her pain seems unbearable.    Patient expresses desire to transition care to our clinic from her doctor in [place].  I will get records from her previous practice today.  Plan: 1.  Continue hydrocodone-acetaminophen 10 mg-325 mg every 8 hours as needed 2.  I will give her enough Norco to get her through to her next prescription refill.  I discussed that this would not be appropriate in the future. She will need a pain contract at her next visit.  3.  Supplement pain management with acetaminophen with a max daily acetaminophen dosage of 2000 mg. 4.  We'll get records from her previous doctor today.

## 2020-09-23 NOTE — Progress Notes (Signed)
Internal Medicine Clinic Attending  Case discussed with Dr. Marianna Payment  At the time of the visit.  We reviewed the resident's history and exam and pertinent patient test results.  I agree with the assessment, diagnosis, and plan of care documented in the resident's note. I recall Kristen [name] from a hospital admission a few months ago when she was treated for COPD exacerbation.  Given her frailty and underweight status, we were concerned about the extent of her disease and whether conversations about life expectancy and personal wishes for care might be, though she voiced a strong relationship with her PCP at [facility] to which she was planning to return.  Now that she is planning to stay in [place], we will need to have some conversations about how much she understands about her disease process and address advance directives.

## 2020-09-25 ENCOUNTER — Other Ambulatory Visit: Payer: Self-pay

## 2020-09-25 ENCOUNTER — Inpatient Hospital Stay (HOSPITAL_COMMUNITY)
Admission: EM | Admit: 2020-09-25 | Discharge: 2020-09-29 | DRG: 190 | Disposition: A | Discharge status: Home Health | Payer: Medicare Other | Attending: Student in an Organized Health Care Education/Training Program | Admitting: Student in an Organized Health Care Education/Training Program

## 2020-09-25 ENCOUNTER — Encounter (HOSPITAL_COMMUNITY): Payer: Self-pay | Admitting: Student in an Organized Health Care Education/Training Program

## 2020-09-25 ENCOUNTER — Emergency Department (HOSPITAL_COMMUNITY): Payer: Medicare Other

## 2020-09-25 DIAGNOSIS — Z79891 Long term (current) use of opiate analgesic: Secondary | ICD-10-CM | POA: Diagnosis not present

## 2020-09-25 DIAGNOSIS — Z681 Body mass index (BMI) 19 or less, adult: Secondary | ICD-10-CM

## 2020-09-25 DIAGNOSIS — Z79899 Other long term (current) drug therapy: Secondary | ICD-10-CM

## 2020-09-25 DIAGNOSIS — E871 Hypo-osmolality and hyponatremia: Secondary | ICD-10-CM | POA: Diagnosis present

## 2020-09-25 DIAGNOSIS — L97319 Non-pressure chronic ulcer of right ankle with unspecified severity: Secondary | ICD-10-CM | POA: Diagnosis present

## 2020-09-25 DIAGNOSIS — E46 Unspecified protein-calorie malnutrition: Secondary | ICD-10-CM | POA: Diagnosis present

## 2020-09-25 DIAGNOSIS — E43 Unspecified severe protein-calorie malnutrition: Secondary | ICD-10-CM | POA: Diagnosis present

## 2020-09-25 DIAGNOSIS — J9621 Acute and chronic respiratory failure with hypoxia: Secondary | ICD-10-CM | POA: Diagnosis present

## 2020-09-25 DIAGNOSIS — F419 Anxiety disorder, unspecified: Secondary | ICD-10-CM | POA: Diagnosis present

## 2020-09-25 DIAGNOSIS — R54 Age-related physical debility: Secondary | ICD-10-CM | POA: Diagnosis present

## 2020-09-25 DIAGNOSIS — J441 Chronic obstructive pulmonary disease with (acute) exacerbation: Principal | ICD-10-CM | POA: Diagnosis present

## 2020-09-25 DIAGNOSIS — J302 Other seasonal allergic rhinitis: Secondary | ICD-10-CM | POA: Diagnosis present

## 2020-09-25 DIAGNOSIS — E861 Hypovolemia: Secondary | ICD-10-CM | POA: Diagnosis present

## 2020-09-25 DIAGNOSIS — E878 Other disorders of electrolyte and fluid balance, not elsewhere classified: Secondary | ICD-10-CM | POA: Diagnosis present

## 2020-09-25 DIAGNOSIS — J9611 Chronic respiratory failure with hypoxia: Secondary | ICD-10-CM | POA: Diagnosis not present

## 2020-09-25 DIAGNOSIS — Z888 Allergy status to other drugs, medicaments and biological substances status: Secondary | ICD-10-CM | POA: Diagnosis not present

## 2020-09-25 DIAGNOSIS — Z9981 Dependence on supplemental oxygen: Secondary | ICD-10-CM | POA: Diagnosis not present

## 2020-09-25 DIAGNOSIS — Z20822 Contact with and (suspected) exposure to covid-19: Secondary | ICD-10-CM | POA: Diagnosis present

## 2020-09-25 DIAGNOSIS — K30 Functional dyspepsia: Secondary | ICD-10-CM | POA: Diagnosis present

## 2020-09-25 DIAGNOSIS — Z720 Tobacco use: Secondary | ICD-10-CM | POA: Diagnosis not present

## 2020-09-25 DIAGNOSIS — L97329 Non-pressure chronic ulcer of left ankle with unspecified severity: Secondary | ICD-10-CM | POA: Diagnosis present

## 2020-09-25 DIAGNOSIS — Z7951 Long term (current) use of inhaled steroids: Secondary | ICD-10-CM | POA: Diagnosis not present

## 2020-09-25 DIAGNOSIS — F17210 Nicotine dependence, cigarettes, uncomplicated: Secondary | ICD-10-CM | POA: Diagnosis present

## 2020-09-25 DIAGNOSIS — R64 Cachexia: Secondary | ICD-10-CM | POA: Diagnosis present

## 2020-09-25 DIAGNOSIS — L98498 Non-pressure chronic ulcer of skin of other sites with other specified severity: Secondary | ICD-10-CM | POA: Diagnosis not present

## 2020-09-25 DIAGNOSIS — E222 Syndrome of inappropriate secretion of antidiuretic hormone: Secondary | ICD-10-CM | POA: Diagnosis present

## 2020-09-25 DIAGNOSIS — F411 Generalized anxiety disorder: Secondary | ICD-10-CM | POA: Diagnosis present

## 2020-09-25 DIAGNOSIS — I872 Venous insufficiency (chronic) (peripheral): Secondary | ICD-10-CM | POA: Diagnosis present

## 2020-09-25 HISTORY — DX: Dyspnea, unspecified: R06.00

## 2020-09-25 LAB — RESPIRATORY PANEL BY RT PCR (FLU A&B, COVID)
Influenza A by PCR: NEGATIVE
Influenza B by PCR: NEGATIVE
SARS Coronavirus 2 by RT PCR: NEGATIVE

## 2020-09-25 LAB — CBC WITH DIFFERENTIAL/PLATELET
Abs Immature Granulocytes: 0.04 10*3/uL (ref 0.00–0.07)
Basophils Absolute: 0.1 10*3/uL (ref 0.0–0.1)
Basophils Relative: 1 %
Eosinophils Absolute: 0.9 10*3/uL — ABNORMAL HIGH (ref 0.0–0.5)
Eosinophils Relative: 8 %
HCT: 41.6 % (ref 36.0–46.0)
Hemoglobin: 12.7 g/dL (ref 12.0–15.0)
Immature Granulocytes: 0 %
Lymphocytes Relative: 12 %
Lymphs Abs: 1.4 10*3/uL (ref 0.7–4.0)
MCH: 29.3 pg (ref 26.0–34.0)
MCHC: 30.5 g/dL (ref 30.0–36.0)
MCV: 95.9 fL (ref 80.0–100.0)
Monocytes Absolute: 0.3 10*3/uL (ref 0.1–1.0)
Monocytes Relative: 2 %
Neutro Abs: 8.8 10*3/uL — ABNORMAL HIGH (ref 1.7–7.7)
Neutrophils Relative %: 77 %
Platelets: 325 10*3/uL (ref 150–400)
RBC: 4.34 MIL/uL (ref 3.87–5.11)
RDW: 12.6 % (ref 11.5–15.5)
WBC: 11.6 10*3/uL — ABNORMAL HIGH (ref 4.0–10.5)
nRBC: 0 % (ref 0.0–0.2)

## 2020-09-25 LAB — BASIC METABOLIC PANEL
Anion gap: 10 (ref 5–15)
BUN: 8 mg/dL (ref 8–23)
CO2: 28 mmol/L (ref 22–32)
Calcium: 9.2 mg/dL (ref 8.9–10.3)
Chloride: 90 mmol/L — ABNORMAL LOW (ref 98–111)
Creatinine, Ser: 0.59 mg/dL (ref 0.44–1.00)
GFR, Estimated: >60 mL/min (ref >60)
Glucose, Bld: 137 mg/dL — ABNORMAL HIGH (ref 70–99)
Potassium: 4.5 mmol/L (ref 3.5–5.1)
Sodium: 128 mmol/L — ABNORMAL LOW (ref 135–145)

## 2020-09-25 LAB — OSMOLALITY: Osmolality: 275 mOsm/kg (ref 275–295)

## 2020-09-25 LAB — HIV ANTIBODY (ROUTINE TESTING W REFLEX): HIV Screen 4th Generation wRfx: NONREACTIVE

## 2020-09-25 LAB — CBG MONITORING, ED: Glucose-Capillary: 113 mg/dL — ABNORMAL HIGH (ref 70–99)

## 2020-09-25 MED ORDER — MOMETASONE FURO-FORMOTEROL FUM 200-5 MCG/ACT IN AERO
1.0000 | INHALATION_SPRAY | Freq: Two times a day (BID) | RESPIRATORY_TRACT | Status: DC
  Administered 2020-09-25 – 2020-09-29 (×8): 1 via RESPIRATORY_TRACT
  Filled 2020-09-25: qty 8.8

## 2020-09-25 MED ORDER — DIPHENHYDRAMINE HCL 25 MG PO CAPS
25.0000 mg | ORAL_CAPSULE | Freq: Four times a day (QID) | ORAL | Status: DC | PRN
  Administered 2020-09-25 – 2020-09-27 (×5): 25 mg via ORAL
  Filled 2020-09-25 (×5): qty 1

## 2020-09-25 MED ORDER — ONDANSETRON HCL 4 MG PO TABS
4.0000 mg | ORAL_TABLET | Freq: Four times a day (QID) | ORAL | Status: DC | PRN
  Administered 2020-09-25: 4 mg via ORAL
  Filled 2020-09-25: qty 1

## 2020-09-25 MED ORDER — IPRATROPIUM BROMIDE HFA 17 MCG/ACT IN AERS
2.0000 | INHALATION_SPRAY | Freq: Once | RESPIRATORY_TRACT | Status: AC
  Administered 2020-09-25: 2 via RESPIRATORY_TRACT
  Filled 2020-09-25: qty 12.9

## 2020-09-25 MED ORDER — ADULT MULTIVITAMIN W/MINERALS CH
1.0000 | ORAL_TABLET | Freq: Every day | ORAL | Status: DC
  Administered 2020-09-25 – 2020-09-29 (×5): 1 via ORAL
  Filled 2020-09-25 (×6): qty 1

## 2020-09-25 MED ORDER — BUSPIRONE HCL 10 MG PO TABS
10.0000 mg | ORAL_TABLET | Freq: Every day | ORAL | Status: DC | PRN
  Administered 2020-09-25 – 2020-09-29 (×5): 10 mg via ORAL
  Filled 2020-09-25 (×5): qty 1

## 2020-09-25 MED ORDER — PREDNISONE 20 MG PO TABS
40.0000 mg | ORAL_TABLET | Freq: Every day | ORAL | Status: AC
  Administered 2020-09-25 – 2020-09-29 (×5): 40 mg via ORAL
  Filled 2020-09-25 (×6): qty 2

## 2020-09-25 MED ORDER — ALBUTEROL SULFATE HFA 108 (90 BASE) MCG/ACT IN AERS
8.0000 | INHALATION_SPRAY | Freq: Once | RESPIRATORY_TRACT | Status: AC
  Administered 2020-09-25: 8 via RESPIRATORY_TRACT
  Filled 2020-09-25: qty 6.7

## 2020-09-25 MED ORDER — ACETAMINOPHEN 325 MG PO TABS
650.0000 mg | ORAL_TABLET | Freq: Four times a day (QID) | ORAL | Status: DC | PRN
  Administered 2020-09-27: 650 mg via ORAL
  Filled 2020-09-25: qty 2

## 2020-09-25 MED ORDER — AZITHROMYCIN 500 MG PO TABS
500.0000 mg | ORAL_TABLET | Freq: Every day | ORAL | Status: AC
  Administered 2020-09-26 – 2020-09-29 (×4): 500 mg via ORAL
  Filled 2020-09-25 (×4): qty 1

## 2020-09-25 MED ORDER — LACTATED RINGERS IV SOLN: INTRAVENOUS | Status: AC

## 2020-09-25 MED ORDER — ONDANSETRON HCL 4 MG/2ML IJ SOLN: 4.0000 mg | Freq: Four times a day (QID) | INTRAMUSCULAR | Status: DC | PRN

## 2020-09-25 MED ORDER — AEROCHAMBER PLUS FLO-VU LARGE MISC: 1.0000 | Freq: Once | Status: DC

## 2020-09-25 MED ORDER — SODIUM CHLORIDE 0.9 % IV SOLN
500.0000 mg | Freq: Every day | INTRAVENOUS | Status: AC
  Administered 2020-09-25: 500 mg via INTRAVENOUS
  Filled 2020-09-25: qty 500

## 2020-09-25 MED ORDER — BUSPIRONE HCL 10 MG PO TABS: 10.0000 mg | ORAL_TABLET | Freq: Every day | ORAL | Status: DC | PRN

## 2020-09-25 MED ORDER — FLUTICASONE PROPIONATE 50 MCG/ACT NA SUSP
1.0000 | Freq: Every day | NASAL | Status: DC | PRN
  Administered 2020-09-25: 1 via NASAL
  Filled 2020-09-25 (×2): qty 16

## 2020-09-25 MED ORDER — ALBUTEROL (5 MG/ML) CONTINUOUS INHALATION SOLN
10.0000 mg/h | INHALATION_SOLUTION | Freq: Once | RESPIRATORY_TRACT | Status: AC
  Administered 2020-09-25: 10 mg/h via RESPIRATORY_TRACT
  Filled 2020-09-25: qty 20

## 2020-09-25 MED ORDER — IPRATROPIUM-ALBUTEROL 0.5-2.5 (3) MG/3ML IN SOLN
3.0000 mL | Freq: Four times a day (QID) | RESPIRATORY_TRACT | Status: DC
  Administered 2020-09-25 – 2020-09-26 (×5): 3 mL via RESPIRATORY_TRACT
  Filled 2020-09-25 (×4): qty 3

## 2020-09-25 MED ORDER — POLYETHYLENE GLYCOL 3350 17 G PO PACK: 17.0000 g | PACK | Freq: Every day | ORAL | Status: DC | PRN

## 2020-09-25 MED ORDER — BUSPIRONE HCL 10 MG PO TABS
10.0000 mg | ORAL_TABLET | Freq: Two times a day (BID) | ORAL | Status: DC
  Administered 2020-09-25: 10 mg via ORAL
  Filled 2020-09-25: qty 1

## 2020-09-25 MED ORDER — ENOXAPARIN SODIUM 40 MG/0.4ML ~~LOC~~ SOLN
40.0000 mg | Freq: Every day | SUBCUTANEOUS | Status: DC
  Administered 2020-09-25 – 2020-09-29 (×5): 40 mg via SUBCUTANEOUS
  Filled 2020-09-25 (×6): qty 0.4

## 2020-09-25 MED ORDER — HYDROCODONE-ACETAMINOPHEN 5-325 MG PO TABS
1.0000 | ORAL_TABLET | Freq: Three times a day (TID) | ORAL | Status: DC | PRN
  Administered 2020-09-25 – 2020-09-29 (×13): 1 via ORAL
  Filled 2020-09-25 (×13): qty 1

## 2020-09-25 NOTE — ED Notes (Signed)
Called to give reports to 2W... Room was ready and rooom was assigned. CN got on the phone and told me that the room was not ready and that  I need to call back later on. Reports given to oncoming RN chris.

## 2020-09-25 NOTE — Progress Notes (Signed)
CALLED ED AND RECEIVED REPORT FROM MELISSA, RN.

## 2020-09-25 NOTE — ED Provider Notes (Signed)
[facility] EMERGENCY DEPARTMENT Provider Note   CSN: [id number] Arrival date & time: 09/25/20  1540     History Chief Complaint  Patient presents with   Respiratory Distress   Shortness of Breath    Kristen [name] is a 74 y.o. female with a history of COPD, chronic hypoxic respiratory failure on 2 L via nasal cannula, and chronic venous stasis who presents to the emergency department via EMS with complaints of dyspnea that acutely worsened tonight.  Patient states she has been feeling somewhat short of breath with wheezing and cough productive of yellow phlegm sputum for the past few days.  Worse when she is up and doing things, mildly alleviated with rest and her inhalers.  Tonight the trouble breathing acutely worsened prompting her daughter to call 911.  Upon EMS arrival patient had sats in the 70s on room air.  She was on a nonrebreather at 91% and placed on CPAP temporarily with transition back to nonrebreather.  She received 2 g of magnesium, 125 of Solu-Medrol, and a DuoNeb in route with some improvement.  Patient denies fever, chills, chest pain, leg pain/swelling, or syncope.  This feels like her COPD issues.  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     Family History  Problem Relation Age of Onset   Diabetes Mother    Diabetes Father    Diverticulitis Sister     Social History   Tobacco Use   Smoking status: Current Every Day Smoker    Packs/day: 0.50    Types: Cigarettes   Smokeless tobacco: Never Used   Tobacco comment: 2-3 per day   Substance Use Topics   Alcohol use: Yes    Comment: occasionally   Drug use: Never    Home Medications Prior to Admission medications   Medication Sig Start Date End Date Taking? Authorizing Provider  albuterol (VENTOLIN HFA) 108 (90 Base) MCG/ACT inhaler Inhale 2 puffs into the lungs every 6 (six) hours as needed for wheezing or shortness of breath. 07/17/20   Asencion Noble, MD  busPIRone (BUSPAR) 10 MG tablet Take 1 tablet (10 mg total) by mouth daily as needed (anxiety). 08/27/20 11/25/20  Lacinda Axon, MD  fluticasone (FLONASE) 50 MCG/ACT nasal spray Place 1 spray into both nostrils daily as needed for allergies or rhinitis.    [provider]  guaiFENesin (MUCINEX) 600 MG 12 hr tablet Take 1 tablet (600 mg total) by mouth 2 (two) times daily. 07/03/20   Bloomfield, Carley D, DO  HYDROcodone-acetaminophen (NORCO) 10-325 MG tablet Take 1 tablet by mouth every 8 (eight) hours as needed for moderate pain. 08/27/20 09/26/20  Lacinda Axon, MD  HYDROcodone-acetaminophen (  NORCO) 10-325 MG tablet Take 1 tablet by mouth every 8 (eight) hours as needed for up to 7 days for severe pain. 09/19/20 09/26/20  Marianna Payment, MD  Ibuprofen-Acetaminophen 125-250 MG TABS Take 1 tablet by mouth 2 (two) times daily as needed (pain/headache).    [provider]  ipratropium-albuterol (DUONEB) 0.5-2.5 (3) MG/3ML SOLN Take 3 mLs by nebulization every 6 (six) hours as needed. 09/02/20 12/01/20  Lacinda Axon, MD  mometasone-formoterol (DULERA) 200-5 MCG/ACT AERO Inhale 1 puff into the lungs 2 (two) times daily. 07/03/20   Bloomfield, Nila Nephew D, DO    Multiple Vitamin (MULTIVITAMIN WITH MINERALS) TABS tablet Take 1 tablet by mouth daily.    [provider]  nicotine (NICODERM CQ - DOSED IN MG/24 HOURS) 21 mg/24hr patch Place 1 patch (21 mg total) onto the skin daily. 07/03/20   Bloomfield, Carley D, DO  potassium chloride (KLOR-CON) 10 MEQ tablet Take 1 tablet (10 mEq total) by mouth daily. 07/03/20   Bloomfield, Nila Nephew D, DO    Allergies    Gabapentin  Review of Systems   Review of Systems  Constitutional: Negative for chills and fever.  HENT: Negative for ear pain and sore throat.   Respiratory: Positive for cough, shortness of breath and wheezing.   Cardiovascular: Negative for chest pain and leg swelling.  Gastrointestinal: Negative for abdominal pain, nausea and vomiting.  Neurological: Negative for syncope.  All other systems reviewed and are negative.   Physical Exam Updated Vital Signs BP (!) 153/96 (BP Location: Right Arm)    Pulse 100    Temp (!) 96.5 F (35.8 C) (Axillary)    Resp (!) 21    SpO2 100%   Physical Exam Vitals and nursing note reviewed.  Constitutional:      General: She is not in acute distress.    Appearance: She is not toxic-appearing.  HENT:     Head: Normocephalic and atraumatic.  Eyes:     General:        Right eye: No discharge.        Left eye: No discharge.     Conjunctiva/sclera: Conjunctivae normal.  Cardiovascular:     Rate and Rhythm: Normal rate and regular rhythm.  Pulmonary:     Effort: No respiratory distress.     Breath sounds: Wheezing (end expiratory throughout) present. No rhonchi or rales.     Comments: Mild tachypnea noted.  Extremely poor air movement throughout. Abdominal:     General: There is no distension.     Palpations: Abdomen is soft.     Tenderness: There is no abdominal tenderness.  Musculoskeletal:     Cervical back: Neck supple.  Skin:    General: Skin is warm and dry.     Findings: No rash.  Neurological:     Mental Status: She is alert.      Comments: Clear speech.   Psychiatric:        Behavior: Behavior normal.     ED Results / Procedures / Treatments   Labs (all labs ordered are listed, but only abnormal results are displayed) Labs Reviewed  CBC WITH DIFFERENTIAL/PLATELET - Abnormal; Notable for the following components:      Result Value   WBC 11.6 (*)    Neutro Abs 8.8 (*)    Eosinophils Absolute 0.9 (*)    All other components within normal limits  BASIC METABOLIC PANEL - Abnormal; Notable for the following components:   Sodium 128 (*)    Chloride  90 (*)    Glucose, Bld 137 (*)    All other components within normal limits  RESPIRATORY PANEL BY RT PCR (FLU A&B, COVID)    EKG None  Radiology DG Chest Port 1 View  Result Date: 09/25/2020 CLINICAL DATA:  Shortness of breath EXAM: PORTABLE CHEST 1 VIEW COMPARISON:  06/28/2020 FINDINGS: There is hyperinflation of the lungs compatible with COPD. Heart and mediastinal contours are within normal limits. No focal opacities or effusions. No acute bony abnormality. Aortic atherosclerosis. IMPRESSION: COPD.  No active disease. Electronically Signed   By: Rolm Baptise M.D.   On: 09/25/2020 03:46    Procedures .Critical Care Performed by: Amaryllis Dyke, PA-C Authorized by: Amaryllis Dyke, PA-C    CRITICAL CARE Performed by: Kennith Maes   Total critical care time: 30 minutes  Critical care time was exclusive of separately billable procedures and treating other patients.  Critical care was necessary to treat or prevent imminent or life-threatening deterioration.  Critical care was time spent personally by me on the following activities: development of treatment plan with patient and/or surrogate as well as nursing, discussions with consultants, evaluation of patient's response to treatment, examination of patient, obtaining history from patient or surrogate, ordering and performing treatments and interventions, ordering and review of  laboratory studies, ordering and review of radiographic studies, pulse oximetry and re-evaluation of patient's condition.   (including critical care time)  Medications Ordered in ED Medications  albuterol (VENTOLIN HFA) 108 (90 Base) MCG/ACT inhaler 8 puff (has no administration in time range)  ipratropium (ATROVENT HFA) inhaler 2 puff (has no administration in time range)  AeroChamber Plus Flo-Vu Medium MISC 1 each (has no administration in time range)    ED Course  I have reviewed the triage vital signs and the nursing notes.  Pertinent labs & imaging results that were available during my care of the patient were reviewed by me and considered in my medical decision making (see chart for details).    MDM Rules/Calculators/A&P                          Patient presents to the ED with complaints of dyspnea.  Hypoxic with EMS, required CPAP temporarily, currently on nonrebreather.  Patient has extremely poor air movement throughout with expiratory wheezing.  She is received Solu-Medrol, magnesium, and 1 DuoNeb.  Will give puffs of albuterol and Atrovent with spacer while COVID-19 test is pending per hospital protocol.  DDx: COPD exacerbation felt to be most likely, also considering CHF, pneumonia, COVID-19, pneumothorax, pulmonary embolism.  Additional history obtained:  Additional history obtained from chart review, nursing note review, and EMS to triage.. Previous records obtained and reviewed -admission for COPD exacerbation earlier this year.  Lab Tests:  I Ordered, reviewed, and interpreted labs, which included:  CBC: Mild leukocytosis. BMP: Mild hyponatremia and hypochloremia similar to prior labs.   Covid test is pending. Imaging Studies ordered:  I ordered imaging studies which included CXR, I independently visualized and interpreted imaging which showed COPD findings, but no acute process- no infiltrate to suggest pneumonia or findings of CHF/pulmonary edema.   ED Course:    05:15: RE-EVAL: Currently on 3.5L via [place] with appropriate SpO2, remains with very poor air movement throughout. She feels like when she has needed to be admitted to the hospital previously. Will discuss with IM service.   Discussed case with internal medicine residency service, except admission, a.m. team to see.  I  have also placed order for continuous albuterol neb for once COVID-19 testing has resulted.  Findings and plan of care discussed with supervising physician Dr. Tyrone Nine who has evaluated the patient & is in agreement.   Portions of this note were generated with [employer]. Dictation errors may occur despite best attempts at proofreading.  Final Clinical Impression(s) / ED Diagnoses Final diagnoses:  COPD exacerbation [facility])    Rx / DC Orders ED Discharge Orders    None       Amaryllis Dyke, PA-C 09/25/20 [place], [place], DO 09/25/20 [phone]

## 2020-09-25 NOTE — H&P (Addendum)
Date: 09/25/2020               Patient Name:  Kristen [name] MRN: [id number]  DOB: August 25, 1946 Age / Sex: 74 y.o., female   PCP: Patient, No Pcp Per         Medical Service: Internal Medicine Teaching Service         Attending Physician: Dr. Evette Doffing, Mallie Mussel, *    First Contact: Dr. Collene Gobble Pager: [phone]  Second Contact: Dr. Marva Panda Pager: [phone]       After Hours (After 5p/  First Contact Pager: [phone]  weekends / holidays): Second Contact Pager: [phone]   Chief Complaint: Shortness of breath  History of Present Illness:  Kristen [name] is a 74 yo F w/ PMH of chronic hypoxic resp failure on 2L at home, COPD, tobacco use, chronic ankle ulcers and anxiety presenting to [facility] with shortness of breath. She was in her usual state of health until last Saturday when she began to feel short of breath while getting ready to go to a birthday party without any obvious inciting event. She mentions noticing worsening wheeze, rhinorrhea and dyspnea. She initially thought it was due to allergies and started taking mucinex and flonase as well as increasing frequency of her albuterol to every 2-3 hours without significant improvement in her symptoms. She uses 2L of oxygen at home but did not feel the need to increase her oxygen use. She had progressively worsening dyspnea with increasing cough with yellow sputum production so she came to ED for evaluation. She mentions that this feels similar to her prior symptoms for COPD exacerbation.  On review of systems, she denies fevers, chills, sick contact. Endorse nausea but no vomiting, diarrhea, or constipation. Denies any hemoptysis, chest pain, palpitations.  Meds:  Current Meds  Medication Sig  . acetaminophen (TYLENOL) 325 MG tablet Take 650 mg by mouth every 6 (six) hours as needed for mild pain or headache.  . albuterol (VENTOLIN HFA) 108 (90 Base) MCG/ACT inhaler Inhale 2 puffs into the lungs every 6 (six) hours as needed for wheezing or  shortness of breath.  . busPIRone (BUSPAR) 10 MG tablet Take 1 tablet (10 mg total) by mouth daily as needed (anxiety).  . fluticasone (FLONASE) 50 MCG/ACT nasal spray Place 1 spray into both nostrils daily as needed for allergies or rhinitis.  Marland Kitchen HYDROcodone-acetaminophen (NORCO) 10-325 MG tablet Take 1 tablet by mouth every 8 (eight) hours as needed for up to 7 days for severe pain.  Marland Kitchen ipratropium-albuterol (DUONEB) 0.5-2.5 (3) MG/3ML SOLN Take 3 mLs by nebulization every 6 (six) hours as needed. (Patient taking differently: Take 3 mLs by nebulization every 6 (six) hours as needed (breathing). )  . mometasone-formoterol (DULERA) 200-5 MCG/ACT AERO Inhale 1 puff into the lungs 2 (two) times daily.  . Multiple Vitamin (MULTIVITAMIN WITH MINERALS) TABS tablet Take 1 tablet by mouth daily.  . potassium chloride (KLOR-CON) 10 MEQ tablet Take 1 tablet (10 mEq total) by mouth daily.   Allergies: Allergies as of 09/25/2020 - Review Complete 09/25/2020  Allergen Reaction Noted  . Gabapentin Other (See Comments) 06/28/2020   Past Medical History:  Diagnosis Date  . Cellulitis   . Chronic venous stasis dermatitis of both lower extremities    Chronic problem that was being manged by PCP in [place]  . COPD (chronic obstructive pulmonary disease) ([place])   . 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 Diabetes Mother   . Diabetes Father   . Diverticulitis Sister    Social History:  Used to live in [place]. Decided to move to [place] recently to be with her sister. Mentions current tobacco use although has significantly cut her use down. She used to smoke 2 pack daily for >40 years but now smokes about 2-3 cigarettes every other day. Mentions 1 drink per week. Denies any illicit substance use.  Review of Systems: A complete ROS was negative except as per  HPI.  Physical Exam: Blood pressure 118/73, pulse 91, temperature (!) 96.5 F (35.8 C), temperature source Axillary, resp. rate (!) 21, SpO2 100 %.  Gen: Thin, chronically ill-appearing, NAD HEENT: NCAT head, hearing intact, EOMI, Swollen nasal turbinates Neck: supple, ROM intact, no JVD, no cervical adenopathy CV: RRR, S1, S2 normal, No rubs, no murmurs, no gallops Pulm: Prolonged expiratory phase with end-expiratory wheeze, Frequent coughs with yellow sputum Abd: Soft, BS+, NTND Extm: ROM intact, Peripheral pulses intact, No peripheral edema Skin: Dry, Warm, Open ulcers with bloody drainage on bilateral ankles without surrounding edema or warmth. Neuro: AAOx3  EKG: personally reviewed my interpretation is normal sinus, no ischemic changes, similar to prior ekg  CXR: personally reviewed my interpretation is hyperinflated lungs with flattened diaphragm, no lobar opacity.  Assessment & Plan by Problem: Active Problems:   COPD exacerbation ([place])  Kristen [name] is a 74 yo F w/ PMH of COPD, tobacco use, chronic hypoxic resp failure on 2L, chronic ankle ulcers presenting to [facility] with shortness of breath  Acute on chronic hypoxic respiratory failure 2/2 COPD exacerbation Present w/ worsening dyspnea requiring 4L [place] (On 2L at home). Significant cough with sputum production without purulence. Has prior hx of COPD exacerbation. No PFTs on file due to receiving most of her prior care at [facility]. Unclear inciting event for COPD exacerbation but per pt, seasonal allergy possibly contributing. Unfortunately continues to smoke which may be causing progression of her COPD. Due to overall fragility and geriatric age, suspect will need longer hospitalization. Admit to inpatient. - Start prednisone 6m for 5 day course - Azithromycin [id number] + 25063maily for 4 days - DuoNebs q6hr - Keep O2 sat >88 - Wean off Oxygen if able - C/w home inhaler regimen: Dulera 1 puff BID  Hypovolemic  Hyponatremia Present w/ sodium of 128. Chart review shows prior hx of hyponatremia. Poor oral intake per patient history. No prior CT chest to screen for lung ca. On prior admission noted continued hyponatremia despite fluid resuscitation. Concerning for possible SIADH. Will get further labs to assess. - Monitor electrolytes - Serum osm, urine osm, urine na  Chronic bilateral ankle ulcers Noted on exam to have bilateral ankle ulcers. Does wound care at home. On hydrocodone-acetaminophen chronically for pain. Mentions 'it never heals.' Palpable pulses. Based on chart review, thought to be due to venous stasis but no significant edema on exam. Hesitant to restart home opioids in setting of acute respiratory distress. Can monitor for now and resume as needed. - Wound care consult  Tobacco Use >40 pack year smoking hx. Currently down to 2-3 cigarettes a day. Previously used nicotine patches but mentions that interested in using patches during hospitalization. No prior screen for lung ca - Nicotine patches offered, pt declined - Would benefit from chest CT for screening. Can get outpatient with PCP unless indication during hospitalization arise  Anxiety On buspirone [id number] prn -  C/w home med  DVT prophx: lovenox Diet: CM Bowel: MIralax Code: Full  Prior to Admission Living Arrangement: Home Anticipated Discharge Location: SNF Barriers to Discharge: Medical tx  Dispo: Admit patient to Inpatient with expected length of stay greater than 2 midnights.  Signed: Mosetta Anis, MD 09/25/2020, 8:19 AM  Pager: [phone]

## 2020-09-25 NOTE — Evaluation (Signed)
Physical Therapy Evaluation Patient Details Name: Kristen [name] MRN: [id number] DOB: 07-Feb-1946 Today's Date: 09/25/2020   History of Present Illness  Pt is a 74 y/o female admitted secondary to worsening SOB. Thought to be from COPD exacerbation. Pt also with bilateral ankle ulcers. PMH includes COPD on 2L and tobacco use.   Clinical Impression  Pt admitted secondary to problem above with deficits below. Pt requiring min A to roll from side to side for linen change. Pt refusing further mobility. Pt with decreased safety awareness as she had her [place] in her mouth instead of nose, despite oxygen sats being in low 80s. Reports it was irritating her nose. Did allow PT to put back on to get oxygen to >90% (on 2L). Will need to further progress mobility to determine appropriate d/c recommendations at d/c. If pt does not progress well, may need to consider SNF. Will continue to follow acutely to maximize functional mobility independence and safety.     Follow Up Recommendations Other (comment) (TBD pending pt progression )    Equipment Recommendations  None recommended by PT    Recommendations for Other Services       Precautions / Restrictions Precautions Precautions: Fall Restrictions Weight Bearing Restrictions: No      Mobility  Bed Mobility Overal bed mobility: Needs Assistance Bed Mobility: Rolling Rolling: Min assist         General bed mobility comments: Pt refusing OOB mobility, however, was agreeable to rolling for linen exchange. Found glasses, phone, and denture cup underneath pt and pt reports she did not know they were there.     Transfers                 General transfer comment: Pt refusing.   Ambulation/Gait                Stairs            Wheelchair Mobility    Modified Rankin (Stroke Patients Only)       Balance                                             Pertinent Vitals/Pain Pain Assessment: No/denies pain     Home Living Family/patient expects to be discharged to:: Private residence Living Arrangements: Other relatives (sister) Available Help at Discharge: Family;Available 24 hours/day Type of Home: House Home Access: Ramped entrance     Home Layout: One level Home Equipment: Walker - 4 wheels      Prior Function Level of Independence: Independent               Hand Dominance        Extremity/Trunk Assessment   Upper Extremity Assessment Upper Extremity Assessment: Defer to OT evaluation    Lower Extremity Assessment Lower Extremity Assessment: Generalized weakness    Cervical / Trunk Assessment Cervical / Trunk Assessment: Kyphotic  Communication   Communication: No difficulties  Cognition Arousal/Alertness: Awake/alert Behavior During Therapy: WFL for tasks assessed/performed Overall Cognitive Status: No family/caregiver present to determine baseline cognitive functioning                                 General Comments: Pt with [place] in mouth instead of nose this session; reports it was flaring her allergies. Very unaware of safety.  General Comments      Exercises     Assessment/Plan    PT Assessment Patient needs continued PT services  PT Problem List Cardiopulmonary status limiting activity;Decreased balance;Decreased mobility;Decreased activity tolerance;Decreased knowledge of use of DME;Decreased knowledge of precautions;Decreased safety awareness;Decreased strength       PT Treatment Interventions DME instruction;Gait training;Functional mobility training;Therapeutic activities;Therapeutic exercise;Balance training;Patient/family education    PT Goals (Current goals can be found in the Care Plan section)  Acute Rehab PT Goals Patient Stated Goal: none stated PT Goal Formulation: With patient Time For Goal Achievement: 10/09/20 Potential to Achieve Goals: Fair    Frequency Min 3X/week   Barriers to discharge         Co-evaluation               AM-PAC PT "6 Clicks" Mobility  Outcome Measure Help needed turning from your back to your side while in a flat bed without using bedrails?: A Little Help needed moving from lying on your back to sitting on the side of a flat bed without using bedrails?: A Little Help needed moving to and from a bed to a chair (including a wheelchair)?: A Little Help needed standing up from a chair using your arms (e.g., wheelchair or bedside chair)?: A Little Help needed to walk in hospital room?: A Lot Help needed climbing 3-5 steps with a railing? : Total 6 Click Score: 15    End of Session Equipment Utilized During Treatment: Oxygen Activity Tolerance: Patient tolerated treatment well Patient left: in bed;with call bell/phone within reach (on stretcher in ED ) Nurse Communication: Mobility status PT Visit Diagnosis: Muscle weakness (generalized) (M62.81);Unsteadiness on feet (R26.81)    Time: 1152-1202 PT Time Calculation (min) (ACUTE ONLY): 10 min   Charges:   PT Evaluation $PT Eval Moderate Complexity: 1 Mod          Kristen [name], PT, DPT  Acute Rehabilitation Services  Pager: [phone] Office: [phone]   Kristen [name] 09/25/2020, 2:56 PM

## 2020-09-25 NOTE — ED Notes (Signed)
The pt asking for gingerale given

## 2020-09-25 NOTE — ED Triage Notes (Signed)
EMS arrival from home co respiratory distress and SOB. Per EMS pt hx COPD. 71% RA upon arrival, 92% with initial nonrebreather placement CPAP applied, with retraction and accessory muscles. Per EMS bilateral wheezing, administer 119m Solumedrol, 2g mag, x1 neb. 18G R forearm.

## 2020-09-25 NOTE — ED Notes (Signed)
Wound RN at bedside.

## 2020-09-25 NOTE — ED Notes (Signed)
Pt reports using 2L O2 therapy, prior to arrival pt reports feeling SOB at home with chronic cough with white brown sputum.

## 2020-09-25 NOTE — ED Notes (Signed)
Unsuccessful attempts x 2 to give report to 3e

## 2020-09-25 NOTE — ED Notes (Signed)
Alarms ringing low  Pt alert no distress she reports that her nose was running and she was coughing and she had taken her 02 off  sats ok now  She has the 02 in her mouth

## 2020-09-25 NOTE — Consult Note (Signed)
WOC Nurse Consult Note: Patient receiving care in [facility] ED40 No Aquacel on the unit. Applied Xeroform, covered with 4 x 4 and wrapped with Kerlix until Aquacel is received from materials.  Reason for Consult: LE wounds Wound type: Bilateral medial malleolus wounds Measurement:  Left: 4 cm x 3 cm with yellow purulent sanguinous drainage. 50% yellow, 50% red Right: 3.5 cm x 3.2 cm with yellow sanguinous drainage, 75% red, 25% yellow Periwound: Intact Dressing procedure/placement/frequency:  Clean both ankle wounds with NS and pat dry. Apply Aquacel Kellie Simmering # [phone]) to both wounds, cover with 4 x 4 and wrap with small Kerlix. Change daily.   Monitor the wound area(s) for worsening of condition such as: Signs/symptoms of infection, increase in size, development of or worsening of odor, development of pain, or increased pain at the affected locations.   Notify the medical team if any of these develop.  Thank you for the consult. Kristen [name] nurse will not follow at this time.   Please re-consult the [facility] team if needed.  Kristen Marseilles Tamala Julian, MSN, Kristen [name], Kristen [name], Kristen [name], [facility] Wound Treatment Associate Pager [phone]

## 2020-09-26 ENCOUNTER — Inpatient Hospital Stay (HOSPITAL_COMMUNITY): Payer: Medicare Other

## 2020-09-26 ENCOUNTER — Other Ambulatory Visit: Payer: Self-pay | Admitting: Internal Medicine

## 2020-09-26 DIAGNOSIS — E43 Unspecified severe protein-calorie malnutrition: Secondary | ICD-10-CM | POA: Insufficient documentation

## 2020-09-26 DIAGNOSIS — J441 Chronic obstructive pulmonary disease with (acute) exacerbation: Principal | ICD-10-CM

## 2020-09-26 DIAGNOSIS — L97329 Non-pressure chronic ulcer of left ankle with unspecified severity: Secondary | ICD-10-CM

## 2020-09-26 DIAGNOSIS — L97319 Non-pressure chronic ulcer of right ankle with unspecified severity: Secondary | ICD-10-CM

## 2020-09-26 DIAGNOSIS — E871 Hypo-osmolality and hyponatremia: Secondary | ICD-10-CM

## 2020-09-26 LAB — CBC
HCT: 36.4 % (ref 36.0–46.0)
Hemoglobin: 11.7 g/dL — ABNORMAL LOW (ref 12.0–15.0)
MCH: 29.8 pg (ref 26.0–34.0)
MCHC: 32.1 g/dL (ref 30.0–36.0)
MCV: 92.6 fL (ref 80.0–100.0)
Platelets: 314 10*3/uL (ref 150–400)
RBC: 3.93 MIL/uL (ref 3.87–5.11)
RDW: 12.4 % (ref 11.5–15.5)
WBC: 9.2 10*3/uL (ref 4.0–10.5)
nRBC: 0 % (ref 0.0–0.2)

## 2020-09-26 LAB — GLUCOSE, CAPILLARY
Glucose-Capillary: 111 mg/dL — ABNORMAL HIGH (ref 70–99)
Glucose-Capillary: 98 mg/dL (ref 70–99)

## 2020-09-26 LAB — BASIC METABOLIC PANEL
Anion gap: 6 (ref 5–15)
BUN: 14 mg/dL (ref 8–23)
CO2: 32 mmol/L (ref 22–32)
Calcium: 9 mg/dL (ref 8.9–10.3)
Chloride: 91 mmol/L — ABNORMAL LOW (ref 98–111)
Creatinine, Ser: 0.95 mg/dL (ref 0.44–1.00)
GFR, Estimated: >60 mL/min (ref >60)
Glucose, Bld: 126 mg/dL — ABNORMAL HIGH (ref 70–99)
Potassium: 4.3 mmol/L (ref 3.5–5.1)
Sodium: 129 mmol/L — ABNORMAL LOW (ref 135–145)

## 2020-09-26 MED ORDER — IPRATROPIUM-ALBUTEROL 0.5-2.5 (3) MG/3ML IN SOLN: 3.0000 mL | Freq: Four times a day (QID) | RESPIRATORY_TRACT | Status: DC

## 2020-09-26 MED ORDER — IPRATROPIUM-ALBUTEROL 0.5-2.5 (3) MG/3ML IN SOLN: 3.0000 mL | Freq: Four times a day (QID) | RESPIRATORY_TRACT | Status: DC | PRN

## 2020-09-26 MED ORDER — ENSURE ENLIVE PO LIQD
237.0000 mL | Freq: Three times a day (TID) | ORAL | Status: DC
  Administered 2020-09-26 – 2020-09-29 (×8): 237 mL via ORAL

## 2020-09-26 MED ORDER — PANTOPRAZOLE SODIUM 40 MG PO TBEC
40.0000 mg | DELAYED_RELEASE_TABLET | Freq: Every day | ORAL | Status: DC
  Administered 2020-09-26 – 2020-09-29 (×4): 40 mg via ORAL
  Filled 2020-09-26 (×4): qty 1

## 2020-09-26 NOTE — Progress Notes (Signed)
Initial Nutrition Assessment  DOCUMENTATION CODES:   Severe malnutrition in context of chronic illness  INTERVENTION:   -Ensure Enlive po TID, each supplement provides 350 kcal and 20 grams of protein -MVI with minerals daily  NUTRITION DIAGNOSIS:   Severe Malnutrition related to chronic illness as evidenced by energy intake < or equal to 75% for > or equal to 1 month, severe fat depletion, severe muscle depletion.  GOAL:   Patient will meet greater than or equal to 90% of their needs  MONITOR:   PO intake, Supplement acceptance, Labs, Weight trends, Skin, I & O's  REASON FOR ASSESSMENT:   Consult Assessment of nutrition requirement/status  ASSESSMENT:   Kristen [name] is a 74 yo F w/ PMH of COPD, tobacco use, chronic hypoxic resp failure on 2L, chronic ankle ulcers presenting to [facility] with shortness of breath  Pt admitted with CHF exacerbation.  Reviewed I/O's:-60 ml x 24 hours  UOP: 300 ml x 24 hours  Spoke with pt at bedside, who was pleasant and in good spirits today. She reports she has had poor appetite over the past several months, but has recently started eating more due to concern for weight loss. Per pt, she estimates that she lost about 20-30# within the past few years, but gained 3-4 pounds this month. Pt reports her UBW is around 135# and is now around 97#.   Pt shares that she consumed a few bites of vegetable and pork for lunch and about half of a bowl of oatmeal for breakfast. She has several missing teeth, but denies difficulty chewing or swallowing foods. PTA, pt reports consuming 4-6 meals per day, which consist of either yogurt or oatmeal. Pt reports that she has tried Ensure supplements in the past, but grew tired of them and stopped taking them.   Per [facility] note, pt with bilateral medial malleolus wounds. Pt shares she has had these wounds "for years" and they often come and go.   Discussed importance of good meal and supplement intake to promote healing.  Pt amenable to strawberry Ensure supplements.   Medications reviewed and include lovenox and prednisone.   Labs reviewed: Na: 129.   NUTRITION - FOCUSED PHYSICAL EXAM:    Most Recent Value  Orbital Region Severe depletion  Upper Arm Region Severe depletion  Thoracic and Lumbar Region Severe depletion  Buccal Region Severe depletion  Temple Region Severe depletion  Clavicle Bone Region Severe depletion  Clavicle and Acromion Bone Region Severe depletion  Scapular Bone Region Severe depletion  Dorsal Hand Severe depletion  Patellar Region Severe depletion  Anterior Thigh Region Severe depletion  Posterior Calf Region Severe depletion  Edema (RD Assessment) None  Hair Reviewed  Eyes Reviewed  Skin Reviewed  Nails Reviewed       Diet Order:   Diet Order            Diet Carb Modified Fluid consistency: Thin; Room service appropriate? Yes  Diet effective now                 EDUCATION NEEDS:   Education needs have been addressed  Skin:  Skin Assessment: Skin Integrity Issues: Skin Integrity Issues:: Other (Comment) Other: Bilateral medial malleolus wounds  Last BM:  09/25/20  Height:   Ht Readings from Last 1 Encounters:  06/28/20 _0  (1.778 m)    Weight:   Wt Readings from Last 1 Encounters:  09/18/20 43.3 kg    Ideal Body Weight:  72.7 kg  BMI:  There is  no height or weight on file to calculate BMI.  Estimated Nutritional Needs:   Kcal:  1700-1900  Protein:  85-100 grams  Fluid:  > 1.7 L    Loistine Chance, RD, LDN, [place] Registered Dietitian II Certified Diabetes Care and Education Specialist Please refer to [facility] for RD and/or RD on-call/weekend/after hours pager

## 2020-09-26 NOTE — Evaluation (Signed)
Occupational Therapy Evaluation Patient Details Name: Kristen [name] MRN: [id number] DOB: 1946/07/11 Today's Date: 09/26/2020    History of Present Illness Pt is a 74 y/o female admitted secondary to worsening SOB. Thought to be from COPD exacerbation. Pt also with bilateral ankle ulcers. PMH includes COPD on 2L and tobacco use.    Clinical Impression   Patient admitted with the above diagnosis.  Presents with chronic venous stasis ulcers to ankles, generalized weakness, fair activity tolerance, poor stand balance, and chronic O2 at home (2 L at home, currently on 3 L); all of which impact her ADL, mobility and toilet independence compared to her stated level of function.  At home she is independent with all of her self care, and helps her sister with meals and light home management.  States she needs no assist with meds, and still drives.  Currently she is staying and visiting with her sister, but resides in [place] by herself.  OT will follow in the acute setting, she anticipates discharging home with her sister when cleared medically.      Follow Up Recommendations  No OT follow up    Equipment Recommendations  None recommended by OT    Recommendations for Other Services       Precautions / Restrictions Precautions Precautions: Fall Restrictions Weight Bearing Restrictions: No      Mobility Bed Mobility   Bed Mobility: Rolling;Supine to Sit;Sit to Supine Rolling: Modified independent (Device/Increase time)   Supine to sit: Modified independent (Device/Increase time) Sit to supine: Modified independent (Device/Increase time)        Transfers Overall transfer level: Needs assistance   Transfers: Sit to/from Stand;Stand Pivot Transfers Sit to Stand: Min assist Stand pivot transfers: Min assist       General transfer comment: weakness and stiffness reported.    Balance Overall balance assessment: Needs assistance Sitting-balance support: No upper extremity  supported Sitting balance-Leahy Scale: Good     Standing balance support: Bilateral upper extremity supported Standing balance-Leahy Scale: Poor                             ADL either performed or assessed with clinical judgement   ADL Overall ADL's : Needs assistance/impaired Eating/Feeding: Independent;Bed level   Grooming: Wash/dry hands;Wash/dry face;Set up;Bed level           Upper Body Dressing : Supervision/safety;Sitting   Lower Body Dressing: Supervision/safety;Sitting/lateral leans   Toilet Transfer: Minimal assistance Toilet Transfer Details (indicate cue type and reason): HHA         Functional mobility during ADLs: Minimal assistance General ADL Comments: HHA     Vision Baseline Vision/History: Wears glasses Wears Glasses: Reading only Patient Visual Report: No change from baseline       Perception     Praxis      Pertinent Vitals/Pain Pain Assessment: Faces Faces Pain Scale: Hurts a little bit Pain Location: generalized stiffness Pain Descriptors / Indicators: Nagging Pain Intervention(s): Monitored during session     Hand Dominance Right   Extremity/Trunk Assessment Upper Extremity Assessment Upper Extremity Assessment: Overall WFL for tasks assessed   Lower Extremity Assessment Lower Extremity Assessment: Defer to PT evaluation   Cervical / Trunk Assessment Cervical / Trunk Assessment: Kyphotic   Communication Communication Communication: No difficulties   Cognition Arousal/Alertness: Awake/alert Behavior During Therapy: WFL for tasks assessed/performed Overall Cognitive Status: Within Functional Limits for tasks assessed  Home Living Family/patient expects to be discharged to:: Private residence Living Arrangements: Other relatives Available Help at Discharge: Family;Available 24 hours/day Type of Home: House Home Access: Ramped entrance      Home Layout: One level     Bathroom Shower/Tub: Astronomer Accessibility: Yes How Accessible: Accessible via walker Home Equipment: [place] - 4 wheels;Walker - 2 wheels;Shower seat;Wheelchair - Education officer, community - power;Bedside commode   Additional Comments: equipment is sisters, she has in a storage area.      Prior Functioning/Environment Level of Independence: Independent        Comments: Reports Independence with ADLs, IADls, and mobility. Pt was driving. No use of AD        OT Problem List: Decreased strength;Decreased activity tolerance;Impaired balance (sitting and/or standing)      OT Treatment/Interventions: Self-care/ADL training;Therapeutic exercise;Balance training;Therapeutic activities    OT Goals(Current goals can be found in the care plan section) ADL Goals Pt Will Perform Grooming: Independently;standing Pt Will Perform Lower Body Bathing: sit to/from stand;Independently Pt Will Perform Lower Body Dressing: Independently;sit to/from stand Pt Will Transfer to Toilet: Independently;regular height toilet;ambulating  OT Frequency: Min 2X/week   Barriers to D/C: Other (comment)  medical status       Co-evaluation              AM-PAC OT "6 Clicks" Daily Activity     Outcome Measure Help from another person eating meals?: None Help from another person taking care of personal grooming?: None Help from another person toileting, which includes using toliet, bedpan, or urinal?: A Little Help from another person bathing (including washing, rinsing, drying)?: A Little Help from another person to put on and taking off regular upper body clothing?: A Little Help from another person to put on and taking off regular lower body clothing?: A Little 6 Click Score: 20   End of Session Equipment Utilized During Treatment: Oxygen  Activity Tolerance: Patient tolerated treatment well Patient left: in bed;with call bell/phone within reach;with  bed alarm set  OT Visit Diagnosis: Unsteadiness on feet (R26.81);Muscle weakness (generalized) (M62.81)                Time: 1415-9733 OT Time Calculation (min): 25 min Charges:  OT General Charges $OT Visit: 1 Visit OT Evaluation $OT Eval Moderate Complexity: 1 Mod  09/26/2020  Rich, OTR/L  Acute Rehabilitation Services  Office:  [place] 09/26/2020, 12:04 PM

## 2020-09-26 NOTE — Progress Notes (Signed)
° °  Subjective:  Patient evaluated at bedside this AM. This morning, reports feeling a little better but continues to have trouble with breathing. Feels a little lethargic. Has not been able to sleep much. Decreased oral intake due to indigestion. She takes mylanta/tums as needed at home. She has been using 2L O2 at home. She is from [facility] and has been in [place] for 3 months. She is unaware if she had CT Chest in [facility].  Objective:  Vital signs in last 24 hours: Vitals:   09/25/20 2030 09/26/20 0002 09/26/20 0254 09/26/20 0503  BP: 106/67 106/66  129/83  Pulse: 96 79  80  Resp: 18   16  Temp: 98.5 F (36.9 C) 98.3 F (36.8 C)  98 F (36.7 C)  TempSrc: Axillary Oral  Oral  SpO2: 93% 97% 96% 99%   Physical Exam: General: Thin, chronically ill-appearing. No acute distress. Pulm: Decreased air movement. Some wheezing present. MSK: Atrophied muscles. No pitting edema Skin: Warm, adequate skin turgor  Assessment/Plan: Kristen [name] is 74yo female with COPD, tobacco use disorder, chronic hypoxic respiratory failure on home 2L admitted 11/3 for COPD exacerbation with concern for possible malignancy.  Active Problems:   COPD exacerbation ([place])  #COPD exacerbation Patient reports she continues to have dyspnea and cough, although improved from arrival yesterday. Mentions she sees pulmonologist in [facility], where she still lives, although has been staying with sister in [place] for the past few months. She currently only uses Dulera and albuterol at home. Given multiple hospitalizations for exacerbations over last couple months, likely would benefit from LABA/LAMA/ICS triple therapy on discharge. Will continue antibiotics, steroids. -C/w prednisone day 2/5 -C/w azithromycin (day 2) -DuoNebs, Dulera - CBG monitoring  #Euovolemic hyponatremia #Cachexia Patient euvolemic and cachectic on exam. Patient also has prior hx of hyponatremia during last hospitalization. Given  her significant hx of tobacco use, euvolemic hyponatremia, and cachectic state, concern for possible SIADH, malignancy. Will rule out other causes of euvolemic hyponatremia, including hypothyroidism and adrenal insufficiency. She is unsure if she has had previous CT in [facility]. - Pending urine studies, TSH, AM cortisol - CT chest today - Monitor BMP - Dietician consult  #Chronic bilateral ankle ulcers Ulcers are chronic, does wound care at home. Patient on chronic hydrocodone-acetaminophen at home for pain. Will give 1/2 dose given respiratory status. No signs of infection since admission.  - WOC - Norco 5 q8 PRN  DIET: CM IVF: n/a DVT PPX: Lovenox BOWEL: Miralax CODE: FULL  Prior to Admission Living Arrangement: Home Anticipated Discharge Location: Home Barriers to Discharge: medical management Dispo: Anticipated discharge in approximately 1-2 day(s).   [name], MD 09/26/2020, 6:04 AM Pager: [phone] After 5pm on weekdays and 1pm on weekends: On Call pager [phone]

## 2020-09-26 NOTE — Progress Notes (Signed)
Physical Therapy Treatment Patient Details Name: Kristen [name] MRN: [id number] DOB: Feb 26, 1946 Today's Date: 09/26/2020    History of Present Illness Pt is a 74 y/o female admitted secondary to worsening SOB. Thought to be from COPD exacerbation. Pt also with bilateral ankle ulcers. PMH includes COPD on 2L and tobacco use.     PT Comments    Patient refused OOB mobility initially, pt noted wet bed pad due to purewic malfunction. Patient agreeable to standing for pericare and linen change due to soiled sheets upon examination. Patient performed sit to stand and stand pivot transfer bed<>chair with min guard and no AD, pt demos decreased safety awareness. Unable to get good spO2 reading due to patient unwilling to leave on finger for period of time. Patient continues to be limited by decreased activity tolerance, impaired balance, generalized weakness. Recommend HHPT following discharge to maximize functional mobility.      Follow Up Recommendations  Home health PT;Supervision for mobility/OOB     Equipment Recommendations  None recommended by PT    Recommendations for Other Services       Precautions / Restrictions Precautions Precautions: Fall Restrictions Weight Bearing Restrictions: No    Mobility  Bed Mobility Overal bed mobility: Needs Assistance Bed Mobility: Rolling;Supine to Sit;Sit to Supine Rolling: Modified independent (Device/Increase time)   Supine to sit: Modified independent (Device/Increase time) Sit to supine: Modified independent (Device/Increase time)      Transfers Overall transfer level: Needs assistance Equipment used: None Transfers: Sit to/from [facility] Sit to Stand: Min guard Stand pivot transfers: Min guard       General transfer comment: Pt refused OOB mobility initially, however pt noted wet pad from purwic malfunction. Patient agreeable to standing for pericare and replacement of bed pad. min guard for sit to stand.  Independent for pericare. Sheets were soiled upon standing. Patient agreeable to stand pivot into chair while linens were changed. Patient decreased awareness of safety with quick unsafe movements without assistance.   Ambulation/Gait                 Stairs             Wheelchair Mobility    Modified Rankin (Stroke Patients Only)       Balance Overall balance assessment: Needs assistance Sitting-balance support: No upper extremity supported;Feet supported Sitting balance-Leahy Scale: Good     Standing balance support: Bilateral upper extremity supported Standing balance-Leahy Scale: Poor                              Cognition Arousal/Alertness: Awake/alert Behavior During Therapy: WFL for tasks assessed/performed Overall Cognitive Status: Within Functional Limits for tasks assessed                                        Exercises      General Comments        Pertinent Vitals/Pain Pain Assessment: Faces Faces Pain Scale: Hurts a little bit Pain Location: generalized stiffness Pain Descriptors / Indicators: Nagging Pain Intervention(s): Limited activity within patient's tolerance;Monitored during session;Repositioned    Home Living Family/patient expects to be discharged to:: Private residence Living Arrangements: Other relatives Available Help at Discharge: Family;Available 24 hours/day Type of Home: House Home Access: Ramped entrance   Home Layout: One level Home Equipment: Kristen [name] - 4 wheels;Kristen [name] - 2 wheels;Shower seat;Wheelchair -  manual;Wheelchair - power;Bedside commode Additional Comments: equipment is sisters, she has in a storage area.    Prior Function Level of Independence: Independent      Comments: Reports Independence with ADLs, IADls, and mobility. Pt was driving. No use of AD   PT Goals (current goals can now be found in the care plan section) Acute Rehab PT Goals Patient Stated Goal: [place] PT Goal Formulation: With patient Time For Goal Achievement: 10/09/20 Potential to Achieve Goals: Fair Progress towards PT goals: Progressing toward goals    Frequency    Min 3X/week      PT Plan Current plan remains appropriate    Co-evaluation              AM-PAC PT "6 Clicks" Mobility   Outcome Measure  Help needed turning from your back to your side while in a flat bed without using bedrails?: None Help needed moving from lying on your back to sitting on the side of a flat bed without using bedrails?: None Help needed moving to and from a bed to a chair (including a wheelchair)?: A Little Help needed standing up from a chair using your arms (e.g., wheelchair or bedside chair)?: A Little Help needed to walk in hospital room?: A Little Help needed climbing 3-5 steps with a railing? : A Little 6 Click Score: 20    End of Session Equipment Utilized During Treatment: Gait belt;Oxygen Activity Tolerance: Patient tolerated treatment well Patient left: in bed;with call bell/phone within reach;with bed alarm set Nurse Communication: Mobility status PT Visit Diagnosis: Muscle weakness (generalized) (M62.81);Unsteadiness on feet (R26.81)     Time: 4656-8127 PT Time Calculation (min) (ACUTE ONLY): 23 min  Charges:  $Therapeutic Activity: 23-37 mins                     Perrin Maltese, PT, DPT Acute Rehabilitation Services Pager [phone] Office [phone]    Kristen [name] 09/26/2020, 3:03 PM

## 2020-09-27 LAB — CBC
HCT: 37.5 % (ref 36.0–46.0)
Hemoglobin: 12.2 g/dL (ref 12.0–15.0)
MCH: 29.5 pg (ref 26.0–34.0)
MCHC: 32.5 g/dL (ref 30.0–36.0)
MCV: 90.8 fL (ref 80.0–100.0)
Platelets: 301 10*3/uL (ref 150–400)
RBC: 4.13 MIL/uL (ref 3.87–5.11)
RDW: 12.5 % (ref 11.5–15.5)
WBC: 8 10*3/uL (ref 4.0–10.5)
nRBC: 0 % (ref 0.0–0.2)

## 2020-09-27 LAB — BASIC METABOLIC PANEL
Anion gap: 8 (ref 5–15)
BUN: 12 mg/dL (ref 8–23)
CO2: 31 mmol/L (ref 22–32)
Calcium: 9.1 mg/dL (ref 8.9–10.3)
Chloride: 88 mmol/L — ABNORMAL LOW (ref 98–111)
Creatinine, Ser: 0.76 mg/dL (ref 0.44–1.00)
GFR, Estimated: >60 mL/min (ref >60)
Glucose, Bld: 98 mg/dL (ref 70–99)
Potassium: 4 mmol/L (ref 3.5–5.1)
Sodium: 127 mmol/L — ABNORMAL LOW (ref 135–145)

## 2020-09-27 LAB — GLUCOSE, CAPILLARY
Glucose-Capillary: 95 mg/dL (ref 70–99)
Glucose-Capillary: 203 mg/dL — ABNORMAL HIGH (ref 70–99)
Glucose-Capillary: 114 mg/dL — ABNORMAL HIGH (ref 70–99)
Glucose-Capillary: 111 mg/dL — ABNORMAL HIGH (ref 70–99)

## 2020-09-27 LAB — TSH: TSH: 0.778 u[IU]/mL (ref 0.350–4.500)

## 2020-09-27 LAB — CORTISOL-AM, BLOOD: Cortisol - AM: 2 ug/dL — ABNORMAL LOW (ref 6.7–22.6)

## 2020-09-27 LAB — OSMOLALITY, URINE: Osmolality, Ur: 352 mOsm/kg (ref 300–900)

## 2020-09-27 LAB — SODIUM, URINE, RANDOM: Sodium, Ur: 59 mmol/L

## 2020-09-27 NOTE — TOC Transition Note (Signed)
Transition of Care [facility]) - CM/SW Discharge Note   Patient Details  Name: Kristen [name] MRN: [id number] Date of Birth: 1946/01/11  [facility] of Care [facility]) CM/SW Contact:  Zenon Mayo, RN Phone Number: 09/27/2020, 9:44 AM   Clinical Narrative:    NCM spoke with patient at bedside, NCM offered choice for [facility], HHPT, she states she does not have a preference. She is from [facility] but is staying at the address in the system with her sister and will be at this address for a while.  She has home oxygen with [employer] 2 liters.    NCM made referral to [facility] with [name], he is able to take referral.  Soc will begin 24 to 48 hrs post discharge.  NCM asked MD for orders.   Final next level of care: [place] Barriers to Discharge: Continued Medical Work up   Patient Goals and CMS Choice Patient states their goals for this hospitalization and ongoing recovery are:: get better CMS Medicare.gov Compare Post Acute Care list provided to:: Patient Choice offered to / list presented to : Patient  Discharge Placement                       Discharge Plan and Services                  DME Agency: NA       HH Arranged: RN, Disease Management, PT [place] Agency: [place] Date [place]: 09/27/20 Time [place]: [phone] Representative spoke with at [place]: [place] ([place]) Interventions     Readmission Risk Interventions No flowsheet data found.

## 2020-09-27 NOTE — Progress Notes (Signed)
Physical Therapy Treatment Patient Details Name: Kristen [name] MRN: [id number] DOB: September 30, 1946 Today's Date: 09/27/2020    History of Present Illness Pt is a 74 y/o female admitted secondary to worsening SOB. Thought to be from COPD exacerbation. Pt also with bilateral ankle ulcers. PMH includes COPD on 2L and tobacco use.     PT Comments    Patient progressing towards physical therapy goals. Patient requires extra time to prepare for OOB mobility and encouragement to perform. Patient is modI for bed mobility and requires min guard for safety with transfers and ambulation. Patient ambulated 2 x 25' with no AD and min guard due to balance deficits, seated rest break between trials. Patient continues to be limited by impaired balance, decreased activity tolerance, generalized weakness. Continue to recommend HHPT following discharge to maximize functional mobility. PT will continue to follow acutely.     Follow Up Recommendations  Home health PT;Supervision for mobility/OOB     Equipment Recommendations  None recommended by PT    Recommendations for Other Services       Precautions / Restrictions Precautions Precautions: Fall    Mobility  Bed Mobility Overal bed mobility: Needs Assistance Bed Mobility: Supine to Sit;Sit to Supine     Supine to sit: Modified independent (Device/Increase time) Sit to supine: Modified independent (Device/Increase time)      Transfers Overall transfer level: Needs assistance Equipment used: None Transfers: Sit to/from Stand Sit to Stand: Min guard         General transfer comment: min guard for safety due to balance deficits  Ambulation/Gait Ambulation/Gait assistance: Min guard Gait Distance (Feet): 50 Feet (2 x 25' with seated rest break) Assistive device: None Gait Pattern/deviations: Step-through pattern;Drifts right/left;Narrow base of support     General Gait Details: min guard for safety due to balance deficits   Stairs              Wheelchair Mobility    Modified Rankin (Stroke Patients Only)       Balance Overall balance assessment: Needs assistance Sitting-balance support: No upper extremity supported;Feet supported Sitting balance-Leahy Scale: Good     Standing balance support: No upper extremity supported;During functional activity Standing balance-Leahy Scale: Poor                              Cognition Arousal/Alertness: Awake/alert Behavior During Therapy: WFL for tasks assessed/performed Overall Cognitive Status: Within Functional Limits for tasks assessed                                        Exercises      General Comments General comments (skin integrity, edema, etc.): spO2 at rest 95% on 3L O2 [place], with ambulation patient maintained 94% on 3L.       Pertinent Vitals/Pain Pain Assessment: Faces Faces Pain Scale: Hurts a little bit Pain Location: generalized stiffness Pain Descriptors / Indicators: Nagging Pain Intervention(s): Limited activity within patient's tolerance;Monitored during session;Repositioned    Home Living                      Prior Function            PT Goals (current goals can now be found in the care plan section) Acute Rehab PT Goals Patient Stated Goal: I want to go home PT Goal Formulation: With patient Time  For Goal Achievement: 10/09/20 Potential to Achieve Goals: Fair Progress towards PT goals: Progressing toward goals    Frequency    Min 3X/week      PT Plan Current plan remains appropriate    Co-evaluation              AM-PAC PT "6 Clicks" Mobility   Outcome Measure  Help needed turning from your back to your side while in a flat bed without using bedrails?: None Help needed moving from lying on your back to sitting on the side of a flat bed without using bedrails?: None Help needed moving to and from a bed to a chair (including a wheelchair)?: A Little Help needed standing up  from a chair using your arms (e.g., wheelchair or bedside chair)?: A Little Help needed to walk in hospital room?: A Little Help needed climbing 3-5 steps with a railing? : A Little 6 Click Score: 20    End of Session Equipment Utilized During Treatment: Gait belt;Oxygen Activity Tolerance: Patient tolerated treatment well Patient left: in bed;with call bell/phone within reach;with bed alarm set Nurse Communication: Mobility status PT Visit Diagnosis: Muscle weakness (generalized) (M62.81);Unsteadiness on feet (R26.81)     Time: 8873-7308 PT Time Calculation (min) (ACUTE ONLY): 30 min  Charges:  $Therapeutic Activity: 23-37 mins                     Perrin Maltese, PT, DPT Acute Rehabilitation Services Pager [phone] Office [phone]    Kristen [name] 09/27/2020, 9:12 AM

## 2020-09-27 NOTE — Progress Notes (Signed)
   Subjective:  States she is feeling fine. Upset about her daughter admitted in behavioral health, came to see her. Planning to call her girlfriend for help with daughter. Breathing improved, O2 down to 2 L. Informed about imaging studies and shows good understanding about COPD worsening and agrees with plan of adding on an inhaler. Denies discharge today as wants to be more mobile, don't want to be burden on her sister. Explained about decrease oral intake, understands well.  Objective:  Vital signs in last 24 hours: Vitals:   09/26/20 2024 09/26/20 2155 09/27/20 0357 09/27/20 0359  BP:   133/79   Pulse: 83  74   Resp: 16  18   Temp:   98.7 F (37.1 C)   TempSrc:   Oral   SpO2:   96%   Weight:  43.3 kg  43.3 kg  Height:  _0  (1.803 m)     Physical Exam: General: Chronically ill-appearing, no acute distress Pulm: No increased WOB, on 2L O2 MSK: Cachectic, atrophied muscles  Assessment/Plan: Ms. Azure is 74yo female with COPD, tobacco use disorder, chronic hypoxic respiratory failure on home 2L admitted 11/3 for COPD exacerbation, improving almost back to baseline.  Principal Problem:   COPD exacerbation ([place]) Active Problems:   Protein-calorie malnutrition ([place])   Hyponatremia   Protein-calorie malnutrition, severe  #COPD exacerbation Patient continues to improve with antibiotic and steroid therapy. Feels as though she is not quite back to baseline. Will continue with current therapy. Discussed adding Spiriva to her home inhaler medication for triple therapy, patient agreed to plan. Will hold now as she is getting DuoNebs while inpatient.  - C/w prednisone, azithromycin (day 3) - DuoNebs, Dulera - CBG monitoring - Add Spiriva at discharge  #Euvolemic hyponatremia #Cachexia Studies from yesterday show normal TSH, low cortisol. Cortisol level drawn very early in the morning, not diagnostic for adrenal insufficiency. Difficult to assess currently since she is on steroids  for COPD exacerbation. CT chest yesterday negative for malignancy. 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 Can consider further work-up of hyponatremia in outpatient setting. Encourage restriction of free water and increased intake of Ensure.  - Free water restriction - Monitor BMP - Ensure TID  #Chronic bilateral ankle ulcers Continue 1/2 home dose pain medication, appreciate wound care recommendations and assistance. - Norco 5 q8 PRN  DIET: CM w/ Ensure IVF: n/a DVT PPX: Lovenox BOWEL: Miralax CODE: FULL  Prior to Admission Living Arrangement: Home Anticipated Discharge Location: Home w/ HH Barriers to Discharge: medical management Dispo: Anticipated discharge in approximately 1-2 day(s).   [name], MD 09/27/2020, 6:44 AM Pager: [phone] After 5pm on weekdays and 1pm on weekends: On Call pager [phone]

## 2020-09-28 LAB — GLUCOSE, CAPILLARY
Glucose-Capillary: 85 mg/dL (ref 70–99)
Glucose-Capillary: 99 mg/dL (ref 70–99)
Glucose-Capillary: 102 mg/dL — ABNORMAL HIGH (ref 70–99)
Glucose-Capillary: 116 mg/dL — ABNORMAL HIGH (ref 70–99)

## 2020-09-28 LAB — SODIUM: Sodium: 129 mmol/L — ABNORMAL LOW (ref 135–145)

## 2020-09-28 NOTE — Progress Notes (Signed)
Subjective:  Patient evaluated at bedside this AM. Ms. Scheid endorses some back pain this morning from sleeping in the beds. Endorses improved cough without any sputum production. Also endorses improvement in breathing. Discussed home health physical therapy for which she is agreeable. Patient is requesting one more day of hospitalization. Will have her ambulate. Patient is concerned about her daughter's mental health and notes that she has to make some arrangements for her discharge.  Discussed addition of Spiriva on discharge.   Objective:  Vital signs in last 24 hours: Vitals:   09/27/20 1150 09/27/20 1957 09/27/20 2043 09/28/20 0305  BP: 126/77  116/83 126/77  Pulse: 85 79 79 74  Resp: 18 17 (!) 22 19  Temp: 97.9 F (36.6 C)  98 F (36.7 C) 97.8 F (36.6 C)  TempSrc: Oral  Oral Oral  SpO2: 96% 96% 94% 92%  Weight:    39.3 kg  Height:       Physical Exam: General: Chronically ill-appearing, no acute distress Pulm: Distant breath sounds, no increased WOB MSK: Cachectic, no pitting edema  Assessment/Plan: Ms. Case is 74yo female with COPD, tobacco use disorder, chronic hypoxic respiratory failure on home 2L admitted 11/3 for COPD exacerbation, back on home oxygen requirement.  Principal Problem:   COPD exacerbation ([place]) Active Problems:   Protein-calorie malnutrition ([place])   Hyponatremia   Protein-calorie malnutrition, severe  #COPD exacerbation Patient improved from yesterday, says she feels her breathing has continued to improve. Mentions she has not ambulated much over past 24 hours. Will need to make sure she can oxygenate well with ambulation prior to discharge. Will continue with abx, steroids, bronchodilaters while inpatient. Sugars remain appropriate on steroids. Plan on adding another inhaler to home regimen for triple maintenance therapy. 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 Urine studies resulted yesterday, showed SIADH.  Patient mentions she usually keeps a drink by her at home and sips on it throughout the day.  Continued to encourage patient to free water restrict. Possibly SIADH 2/2 chronic opioid use. CT chest negative for malignancy.  - Free water restriction - Monitor BMP - Ensure TID  #Chronic bilateral ankle ulcers Patient's pain well-controlled while inpatient. Will continue with 1/2 home dose. Appreciate WOC assistance in ulcer dressings. - Norco q8 PRN  DIET: CM w/ Ensure IVF: n/a DVT PPX: Lovenox BOWEL: Miralax CODE: FULL  Prior to Admission Living Arrangement: Home Anticipated Discharge Location: Home w/ HH Barriers to Discharge: medical management Dispo: Anticipated discharge in approximately 1-2 day(s).   [name], MD 09/28/2020, 6:56 AM Pager: [phone] After 5pm on weekdays and 1pm on weekends: On Call pager [phone]

## 2020-09-28 NOTE — Progress Notes (Signed)
Occupational Therapy Treatment Patient Details Name: Livier Hendel MRN: [id number] DOB: Feb 20, 1946 Today's Date: 09/28/2020    History of present illness Pt is a 74 y/o female admitted secondary to worsening SOB. Thought to be from COPD exacerbation. Pt also with bilateral ankle ulcers. PMH includes COPD on 2L and tobacco use.    OT comments  Patient continues to make steady progress towards goals in skilled OT session. Patient's session encompassed functional mobility to ambulate to the toilet and back in hospital room. Pt with self reported increased weakness throughout session, and tremulous with all movement. Pt on 2L upon entry, however required increase to 3L to engage in activity as sats were sitting around 85%. Pt would benefit from HEP or energy conservation techniques in following session (conversation started, but pt increasingly weak). Discharge remains appropriate, will continue to follow acutely.    Follow Up Recommendations  No OT follow up    Equipment Recommendations  None recommended by OT    Recommendations for Other Services      Precautions / Restrictions Precautions Precautions: Fall Restrictions Weight Bearing Restrictions: No       Mobility Bed Mobility Overal bed mobility: Needs Assistance Bed Mobility: Supine to Sit;Sit to Supine Rolling: Supervision   Supine to sit: Supervision     General bed mobility comments: Supervision for bed mobility due to increased weakness and tremulous with all movement  Transfers Overall transfer level: Needs assistance Equipment used: Rolling walker (2 wheeled) Transfers: Sit to/from Stand Sit to Stand: Min assist         General transfer comment: min a due to increased balance deficits and weakness    Balance Overall balance assessment: Needs assistance Sitting-balance support: Feet supported;Bilateral upper extremity supported Sitting balance-Leahy Scale: Fair     Standing balance support: Bilateral upper  extremity supported;During functional activity Standing balance-Leahy Scale: Poor                             ADL either performed or assessed with clinical judgement   ADL Overall ADL's : Needs assistance/impaired     Grooming: Wash/dry hands;Wash/dry face;Set up;Bed level               Lower Body Dressing: Sitting/lateral leans;Minimal assistance Lower Body Dressing Details (indicate cue type and reason): Min A to don shoes over socks Toilet Transfer: Minimal assistance;Grab Haematologist;Ambulation;RW Toilet Transfer Details (indicate cue type and reason): RW due to increased stability in ambulation Toileting- Clothing Manipulation and Hygiene: Supervision/safety       Functional mobility during ADLs: Minimal assistance;Rolling walker;Cueing for sequencing;Cueing for safety General ADL Comments: Required RW due to increased weakness, O2 sats at 85 on 2L when sitting EOB, bumped up to 3L with sats in the 90s and able to complete ambulation to toilet and back     Vision       Perception     Praxis      Cognition Arousal/Alertness: Awake/alert Behavior During Therapy: WFL for tasks assessed/performed Overall Cognitive Status: Within Functional Limits for tasks assessed                                          Exercises     Shoulder Instructions       General Comments      Pertinent Vitals/ Pain  Pain Assessment: Faces Faces Pain Scale: No hurt  Home Living                                          Prior Functioning/Environment              Frequency  Min 2X/week        Progress Toward Goals  OT Goals(current goals can now be found in the care plan section)  Progress towards OT goals: Progressing toward goals  Acute Rehab OT Goals Patient Stated Goal: I want to go home OT Goal Formulation: With patient Time For Goal Achievement: 10/10/20 Potential to Achieve Goals: Good  Plan  Discharge plan remains appropriate    Co-evaluation                 AM-PAC OT "6 Clicks" Daily Activity     Outcome Measure   Help from another person eating meals?: None Help from another person taking care of personal grooming?: None Help from another person toileting, which includes using toliet, bedpan, or urinal?: A Little Help from another person bathing (including washing, rinsing, drying)?: A Little Help from another person to put on and taking off regular upper body clothing?: A Little Help from another person to put on and taking off regular lower body clothing?: A Little 6 Click Score: 20    End of Session Equipment Utilized During Treatment: Gait belt;Rolling walker;Oxygen  OT Visit Diagnosis: Unsteadiness on feet (R26.81);Muscle weakness (generalized) (M62.81)   Activity Tolerance Patient limited by fatigue;Patient tolerated treatment well   Patient Left in bed;with call bell/phone within reach;with nursing/sitter in room   Nurse Communication Mobility status        Time: 6002-9847 OT Time Calculation (min): 24 min  Charges: OT General Charges $OT Visit: 1 Visit OT Treatments $Self Care/Home Management : 23-37 mins  [place]. [place], [place] Acute Rehabilitation Services [phone] [place] 09/28/2020, 1:54 PM

## 2020-09-29 DIAGNOSIS — J9611 Chronic respiratory failure with hypoxia: Secondary | ICD-10-CM

## 2020-09-29 DIAGNOSIS — Z9981 Dependence on supplemental oxygen: Secondary | ICD-10-CM

## 2020-09-29 DIAGNOSIS — L98498 Non-pressure chronic ulcer of skin of other sites with other specified severity: Secondary | ICD-10-CM

## 2020-09-29 DIAGNOSIS — E43 Unspecified severe protein-calorie malnutrition: Secondary | ICD-10-CM

## 2020-09-29 DIAGNOSIS — Z720 Tobacco use: Secondary | ICD-10-CM

## 2020-09-29 LAB — CBC
HCT: 40.3 % (ref 36.0–46.0)
Hemoglobin: 13.2 g/dL (ref 12.0–15.0)
MCH: 29.5 pg (ref 26.0–34.0)
MCHC: 32.8 g/dL (ref 30.0–36.0)
MCV: 90.2 fL (ref 80.0–100.0)
Platelets: 312 10*3/uL (ref 150–400)
RBC: 4.47 MIL/uL (ref 3.87–5.11)
RDW: 12.4 % (ref 11.5–15.5)
WBC: 8.4 10*3/uL (ref 4.0–10.5)
nRBC: 0 % (ref 0.0–0.2)

## 2020-09-29 LAB — GLUCOSE, CAPILLARY
Glucose-Capillary: 102 mg/dL — ABNORMAL HIGH (ref 70–99)
Glucose-Capillary: 139 mg/dL — ABNORMAL HIGH (ref 70–99)

## 2020-09-29 LAB — BASIC METABOLIC PANEL
Anion gap: 7 (ref 5–15)
BUN: 13 mg/dL (ref 8–23)
CO2: 33 mmol/L — ABNORMAL HIGH (ref 22–32)
Calcium: 9.3 mg/dL (ref 8.9–10.3)
Chloride: 92 mmol/L — ABNORMAL LOW (ref 98–111)
Creatinine, Ser: 0.74 mg/dL (ref 0.44–1.00)
GFR, Estimated: >60 mL/min (ref >60)
Glucose, Bld: 95 mg/dL (ref 70–99)
Potassium: 3.9 mmol/L (ref 3.5–5.1)
Sodium: 132 mmol/L — ABNORMAL LOW (ref 135–145)

## 2020-09-29 MED ORDER — HYDROCODONE-ACETAMINOPHEN 10-325 MG PO TABS: 1.0000 | ORAL_TABLET | Freq: Three times a day (TID) | ORAL | 0 refills | Status: DC | PRN

## 2020-09-29 MED ORDER — ENSURE ENLIVE PO LIQD
237.0000 mL | Freq: Three times a day (TID) | ORAL | 12 refills | Status: DC
Start: 2020-09-29 — End: 2020-10-04

## 2020-09-29 MED ORDER — UMECLIDINIUM BROMIDE 62.5 MCG/INH IN AEPB
1.0000 | INHALATION_SPRAY | Freq: Every day | RESPIRATORY_TRACT | Status: DC
  Administered 2020-09-29: 12:00:00 1 via RESPIRATORY_TRACT
  Filled 2020-09-29: qty 7

## 2020-09-29 MED ORDER — UMECLIDINIUM BROMIDE 62.5 MCG/INH IN AEPB
1.0000 | INHALATION_SPRAY | Freq: Every day | RESPIRATORY_TRACT | 0 refills | Status: DC
Start: 2020-09-29 — End: 2020-10-30

## 2020-09-29 NOTE — Progress Notes (Signed)
Subjective:  Patient evaluated at bedside this AM. Mentions she was able to get out of bed and get to the bathroom on her own. Discussed that patient has completed five day course of antibiotics and steroids. Patient recommend for discharge to home with home health physical therapy and for follow up in our clinic. She notes that she has been restricting her sugar intake.  Discussed adding Spiriva on discharge.   Objective:  Vital signs in last 24 hours: Vitals:   09/28/20 1320 09/28/20 2002 09/28/20 2021 09/29/20 0513  BP:   98/61 132/80  Pulse:  79 77 77  Resp:  _0 Temp:   (!) 97.5 F (36.4 C) (!) 97.4 F (36.3 C)  TempSrc:   Oral Oral  SpO2: (!) 89% 92% 92% 92%  Weight:      Height:       Physical Exam: General: Chronically ill-appearing, no acute distress Pulm: No increased WOB, distant breath sounds Neuro: Awake, alert, oriented x3  Assessment/Plan: Ms. Markham is 74yo female with COPD, tobacco use disorder, chronic hypoxic respiratory failure on home 2L admitted 11/3 for COPD exacerbation, ready for discharge after 5d course of steroids and antibiotics.  Principal Problem:   COPD exacerbation ([place]) Active Problems:   Protein-calorie malnutrition ([place])   Hyponatremia   Protein-calorie malnutrition, severe  #COPD exacerbation Patient able to ambulate with PT yesterday. She states her breathing feels good today, currently on her home oxygen requirement. Patient to add Incruse to her daily regimen for triple maintenance therapy. Plans to follow-up with clinic later this week. - Prednisone, azithromycin (day 5/5) - DuoNebs, Dulera, Incruse - CBG monitoring - F/u in clinic  #Euvolemic hyponatremia #Chronic SIADH Na slowly improving, 129 > 132 with free water restriction. 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 CT chest negative for malignancy. Encouraging continued intake of Ensure at discharge. Will continue monitoring sodium outpatient. - Free water  restriction - F/u BMP in clinic - Ensure TID  #Chronic bilateral ankle ulcers Pain controlled with norco 5 q8. Prescribing 5d of medication until she can be seen in clinic later this week. Will have [place] follow-up with wound care after discharge. - Norco 40m q8 PRN - D/c with HH PT  DIET: CM w/ Ensure IVF: n/a DVT PPX: Lovenox BOWEL: Miralax CODE: FULL  Prior to Admission Living Arrangement: Home Anticipated [facility] Barriers to Discharge: none Dispo: Anticipated discharge in approximately 0 day(s).   BSanjuan Dame MD 09/29/2020, 6:13 AM Pager: [phone] 5pm on weekdays and 1pm on weekends: On Call pager [phone]

## 2020-09-29 NOTE — Discharge Summary (Signed)
Name: Kristen [name] MRN: [id number] DOB: 1946-09-11 74 y.o. PCP: Patient, No Pcp Per  Date of Admission: 09/25/2020  3:18 AM Date of Discharge:  09/29/2020 Attending Physician: Kristen [name], *  Discharge Diagnosis: 1. COPD exacerbation 2. Chronic SIADH 3. Chronic bilateral ankle ulcers  Discharge Medications: Allergies as of 09/29/2020      Reactions   Gabapentin Other (See Comments)   Hallucination/ nightmares      Medication List    TAKE these medications   acetaminophen 325 MG tablet Commonly known as: TYLENOL Take 650 mg by mouth every 6 (six) hours as needed for mild pain or headache.   albuterol 108 (90 Base) MCG/ACT inhaler Commonly known as: VENTOLIN HFA Inhale 2 puffs into the lungs every 6 (six) hours as needed for wheezing or shortness of breath.   busPIRone 10 MG tablet Commonly known as: BUSPAR Take 1 tablet (10 mg total) by mouth daily as needed (anxiety).   feeding supplement Liqd Take 237 mLs by mouth 3 (three) times daily between meals.   fluticasone 50 MCG/ACT nasal spray Commonly known as: FLONASE Place 1 spray into both nostrils daily as needed for allergies or rhinitis.   guaiFENesin 600 MG 12 hr tablet Commonly known as: MUCINEX Take 1 tablet (600 mg total) by mouth 2 (two) times daily.   HYDROcodone-acetaminophen 10-325 MG tablet Commonly known as: Norco Take 1 tablet by mouth every 8 (eight) hours as needed for up to 5 days for moderate pain. What changed: Another medication with the same name was removed. Continue taking this medication, and follow the directions you see here.   ipratropium-albuterol 0.5-2.5 (3) MG/3ML Soln Commonly known as: DUONEB Take 3 mLs by nebulization every 6 (six) hours as needed. What changed: reasons to take this   mometasone-formoterol 200-5 MCG/ACT Aero Commonly known as: DULERA Inhale 1 puff into the lungs 2 (two) times daily.   multivitamin with minerals Tabs tablet Take 1 tablet by mouth  daily.   nicotine 21 mg/24hr patch Commonly known as: NICODERM CQ - dosed in mg/24 hours Place 1 patch (21 mg total) onto the skin daily.   potassium chloride 10 MEQ tablet Commonly known as: KLOR-CON Take 1 tablet (10 mEq total) by mouth daily.   umeclidinium bromide 62.5 MCG/INH Aepb Commonly known as: INCRUSE ELLIPTA Inhale 1 puff into the lungs daily.            Discharge Care Instructions  (From admission, onward)         Start     Ordered   09/29/20 0000  Discharge wound care:       Comments: Continue the wound care regimen you were doing at home. We will have Kristen [name] come and assist as well.   09/29/20 1037          Disposition and follow-up:   Kristen [name] was discharged from [facility] in Stable condition.  At the hospital follow up visit please address:  COPD exacerbation: Completed 5d steroids, abx. Added Incruse to regimen for triple maintenance therapy.  Chronic SIADH: CT chest negative. Recommending free water restriction. Na 132 on discharge.  Chronic bilateral ankle ulcer: Gave pain medication for the week. Ordered Home Health RN for wound care.  2.  Labs / imaging needed at time of follow-up: CBC, BMP  3.  Pending labs/ test needing follow-up: n/a  Follow-up Appointments:  Follow-up Information     [facility] Follow up.   Specialty: Home  Health Services Why: HHRN, HHPT Contact information: [place] [address] [phone]         [place] Follow up.   Specialty: Internal Medicine Why: Our front office will call you on Monday or Tuesday to set up an appointment. Please make arrangements for an appointment later this week. Make sure to arrive 15 minutes before your scheduled appointment time. Thank you! Contact information: [address] [id number] [place] [facility] Course by problem list: 1. COPD exacerbation: Patient with dyspnea, productive cough on arrival. No signs of pneumonia on CXR. During stay completed 5 days of steroids and azithromycin. Patient back to her baseline of 2L oxygen. Since patient has had multiple hospitalization for COPD exacerbations, adding Incruse to [facility] for triple maintenance therapy.  2. Chronic SIADH: Chronic hyponatremia noted on admission. CT chest ordered given extensive smoking history and cachectic-appearing on exam. CT chest negative for malignancy. Urine, serum studies indicate SIADH. Possibly 2/2 chronic opioid use. Na 132 on discharge.  3. Chronic bilateral ankle ulcers: No signs of infection on arrival. Plan to have home health nurses follow-up for continued wound care.  Discharge Vitals:   BP 132/80 (BP Location: Right Arm)   Pulse 77   Temp (!) 97.4 F (36.3 C) (Oral)   Resp 20   Ht 5' 11" (1.803 m)   Wt 38.8 kg   SpO2 92%   BMI 11.94 kg/m   Pertinent Labs, Studies, and Procedures:  CBC Latest Ref Rng & Units 09/29/2020 09/27/2020 09/26/2020  WBC 4.0 - 10.5 K/uL 8.4 8.0 9.2  Hemoglobin 12.0 - 15.0 g/dL 13.2 12.2 11.7(L)  Hematocrit 36 - 46 % 40.3 37.5 36.4  Platelets 150 - 400 K/uL 312 301 314   BMP Latest Ref Rng & Units 09/29/2020 09/28/2020 09/27/2020  Glucose 70 - 99 mg/dL 95 - 98  BUN 8 - 23 mg/dL 13 - 12  Creatinine 0.44 - 1.00 mg/dL 0.74 - 0.76  Sodium 135 - 145 mmol/L 132(L) 129(L) 127(L)  Potassium 3.5 - 5.1 mmol/L 3.9 - 4.0  Chloride 98 - 111 mmol/L 92(L) - 88(L)  CO2 22 - 32 mmol/L 33(H) - 31  Calcium 8.9 - 10.3 mg/dL 9.3 - 9.1   CT chest (09/26/20):  1. No evidence of interstitial lung disease. 2. Clustered indistinct peribronchovascular nodularity in the medial right middle lobe with associated mildly thickened parenchymal band and mild cylindrical bronchiectasis. Findings are nonspecific but most likely due to a chronic infectious or inflammatory process. Suggest  attention on follow-up chest CT in 3-6 months. 3. Severe centrilobular emphysema with diffuse bronchial wall thickening, suggesting COPD. 4. Trace dependent left pleural effusion. 5. Two-vessel coronary atherosclerosis. 6. Small hiatal hernia. 7. Aortic Atherosclerosis (ICD10-I70.0) and Emphysema (ICD10-J43.9).  Discharge Instructions:   Kristen [name], I am glad you are feeling better! You were admitted with symptoms of a cough and shortness of breath, consistent with a COPD exacerbation. After five days of steroids and antibiotics, you have improved and are requiring your regular home oxygen. Please see the following notes:  -We have prescribed 5 more days of your pain medication until you are able to see [name] in the clinic later this week.  -In addition, please take home the inhalers that are in your room. These include Dulera and Incruse. We will use Incruse instead of the Spiriva we previously discussed.  These inhalers are to be taken every day. I sent in some more [facility]use to the [facility] on [address] if you run out.  -During your stay in the hospital, we scanned your chest to check for a malignancy. Thankfully, we did not see signs of cancer in your lungs.   -As we discussed, your lab work shows you have an excess amount of free water in your blood. We recommend to continue restricting the amount of free water you drink every day. We have seen improvement of these numbers as you have restricted the water while in the hospital. We will follow-up with lab work in the clinic.  -We also encourage you to continue drinking Ensure three times per day to make sure you are getting enough protein and calories every day.  -Our physical therapists have recommended using Home Health physical therapy to help gain some strength. In addition, we have ordered Home Health nurses to check on your ankle wounds.  It was a pleasure to meet you, Kristen [name]. I wish you the best and hope you stay happy and  healthy!  Thank you, [name], MD  Signed: [name], MD 09/29/2020, 9:21 AM   Pager: [phone]

## 2020-09-29 NOTE — TOC Transition Note (Signed)
Transition of Care (TOC) - CM/SW Discharge Note Marvetta Gibbons RN, BSN Transitions of Care Unit 4E- RN Case Manager See Treatment Team for direct phone # Weekend cross coverage for 3E   Patient Details  Name: Kristen [name] MRN: [id number] Date of Birth: 29-May-1946  Transition of Care [facility]) CM/SW Contact:  Dawayne Patricia, RN Phone Number: 09/29/2020, 11:20 AM   Clinical Narrative:    Pt stable for transition home today, [place] orders placed for HHRN/PT/OT, referral has been sent to [facility] per previous CM. Notification sent to [facility] with [facility] of discharge today. [place] to f/u for start of care services.    Final next level of care: [place] Barriers to Discharge: Barriers Resolved   Patient Goals and CMS Choice Patient states their goals for this hospitalization and ongoing recovery are:: get better CMS Medicare.gov Compare Post Acute Care list provided to:: Patient Choice offered to / list presented to : Patient  Discharge Placement                   Home with [facility]    Discharge Plan and Services                DME Arranged: N/A DME Agency: NA       HH Arranged: RN, Disease Management, PT [place] Agency: [place] Date [place]: 09/27/20 Time [place]: [phone] Representative spoke with at [place]: [place] ([place]) Interventions     Readmission Risk Interventions Readmission Risk Prevention Plan 09/29/2020  Transportation Screening Complete  PCP or Specialist Appt within 5-7 Days Complete  Home Care Screening Complete  Medication Review (RN CM) Complete  Some recent data might be hidden

## 2020-09-30 ENCOUNTER — Telehealth: Payer: Self-pay | Admitting: Internal Medicine

## 2020-09-30 ENCOUNTER — Other Ambulatory Visit: Payer: Self-pay | Admitting: Internal Medicine

## 2020-09-30 NOTE — Telephone Encounter (Signed)
TOC HFU PER DR BRASWELL [facility] FOR 10/04/2020 @ 2:45 PM WITH DR. Laural Golden.

## 2020-10-02 ENCOUNTER — Telehealth: Payer: Self-pay | Admitting: *Deleted

## 2020-10-02 NOTE — Telephone Encounter (Signed)
I agree. Thank you.

## 2020-10-02 NOTE — Telephone Encounter (Signed)
HHN ask for VO 2x week for 1 week 1x week for 8 week For disease and wound management, VO given for HHN  Do you agree? pt really needs UNA boots  Pt is at 85 pounds at 92f 10in and needs to see nutritionist as she doesn't like ensure and also c/o the cost, will send this to [facility]nap. And possibly pt can see donna 11/12  [place] will be with her sister from now on

## 2020-10-04 ENCOUNTER — Ambulatory Visit (INDEPENDENT_AMBULATORY_CARE_PROVIDER_SITE_OTHER): Payer: Medicare Other | Admitting: Internal Medicine

## 2020-10-04 ENCOUNTER — Encounter: Payer: Self-pay | Admitting: Internal Medicine

## 2020-10-04 VITALS — BP 111/68 | HR 80 | Temp 98.3°F | Ht 70.0 in | Wt 92.6 lb

## 2020-10-04 DIAGNOSIS — E871 Hypo-osmolality and hyponatremia: Secondary | ICD-10-CM | POA: Diagnosis not present

## 2020-10-04 DIAGNOSIS — I872 Venous insufficiency (chronic) (peripheral): Secondary | ICD-10-CM | POA: Diagnosis not present

## 2020-10-04 DIAGNOSIS — L97912 Non-pressure chronic ulcer of unspecified part of right lower leg with fat layer exposed: Secondary | ICD-10-CM

## 2020-10-04 DIAGNOSIS — E46 Unspecified protein-calorie malnutrition: Secondary | ICD-10-CM

## 2020-10-04 DIAGNOSIS — J449 Chronic obstructive pulmonary disease, unspecified: Secondary | ICD-10-CM

## 2020-10-04 DIAGNOSIS — L97922 Non-pressure chronic ulcer of unspecified part of left lower leg with fat layer exposed: Secondary | ICD-10-CM

## 2020-10-04 DIAGNOSIS — L97909 Non-pressure chronic ulcer of unspecified part of unspecified lower leg with unspecified severity: Secondary | ICD-10-CM | POA: Insufficient documentation

## 2020-10-04 DIAGNOSIS — L97902 Non-pressure chronic ulcer of unspecified part of unspecified lower leg with fat layer exposed: Secondary | ICD-10-CM

## 2020-10-04 MED ORDER — ENSURE ENLIVE PO LIQD: 1.0000 | Freq: Three times a day (TID) | ORAL | 2 refills | Status: AC

## 2020-10-04 MED ORDER — OXYCODONE-ACETAMINOPHEN 10-325 MG PO TABS: 1.0000 | ORAL_TABLET | Freq: Three times a day (TID) | ORAL | 0 refills | Status: AC | PRN

## 2020-10-04 NOTE — Assessment & Plan Note (Addendum)
Patient reports a chronic history of venous insufficiency of both lower extremities.  She reports that she has had this issue since she was [age] years old along with varicose veins status post surgical interventions.  She has had chronic ulcerations of bilateral ankles on the medial surface for which she is seen wound care and plastic surgery in the past.  Currently she has wound care home health services. She does endorse claudication. She has pain in her feet while walking as well.   Reportedly she has been told that these ulcers are due to venous insufficiency; however they appear more consistent with arterial ulcerations.  On exam, the wound was not draining or bleeding. The wound was re-wrapped with silicone hydrocellular foam dressing and gauze. Distal pulses intact on exam. Discussed obtaining ABIs as well as past medical records.    She has recently been managed with hydrocodone on 10-325 mg tablets every 8 hours.  Reports that acetaminophen has not helped relieve her pain at all.  She is requesting resuming Percocet as this has helped control her pain better in the past.  Will sign pain contract today.  Plan: -Obtain medical records from previous PCP and vascular surgeon Dr. Nadeen Landau from [facility] -Pain control with oxycodone-acetaminophen 10-325 mg tablets every 8 hours -Follow-up ABIs -Signed pain contract

## 2020-10-04 NOTE — Assessment & Plan Note (Signed)
BMI remains to be around 13.  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  Discussed continuing supplements such as Ensure however cost is an issue.  Patient is requesting a prescription be sent to a medical supply store to see if insurance will cover Ensure.  Encouraged healthy diet with increased p.o. intake.  Plan: -Advised to continue nutrition supplements -Printed off paper prescription for Ensure to see if a medical supply store will provide coverage.

## 2020-10-04 NOTE — Progress Notes (Signed)
Internal Medicine Clinic Attending  I saw and evaluated the patient.  I personally confirmed the key portions of the history and exam documented by Dr.  Rehman  and I reviewed pertinent patient test results.  The assessment, diagnosis, and plan were formulated together and I agree with the documentation in the resident's note.  

## 2020-10-04 NOTE — Progress Notes (Addendum)
CC: COPD, leg ulcers   HPI:  Kristen [name] is a 74 y.o. with a PMHx listed below presenting for a hospital follow up regarding her COPD and chronic leg ulcers. For details of today's visit and the status of her chronic medical issues please refer to the assessment and plan.   Past Medical History:  Diagnosis Date  . Cellulitis   . Chronic venous stasis dermatitis of both lower extremities    Chronic problem that was being manged by PCP in [place]  . COPD (chronic obstructive pulmonary disease) ([place])   . Dyspnea   . 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  Respiratory: Positive for wheezing. Negative for cough, sputum production and shortness of breath.   Cardiovascular: Positive for claudication and leg swelling. Negative for chest pain and palpitations.  Gastrointestinal: Negative for abdominal pain, nausea and vomiting.  Genitourinary: Negative for dysuria, frequency and urgency.  Musculoskeletal: Positive for joint pain.  Neurological: Negative for dizziness, tingling, weakness and headaches.     Physical Exam:  There were no vitals filed for this visit.  Physical Exam Constitutional:      General: She is not in acute distress.    Appearance: Normal appearance. She is not ill-appearing.  Cardiovascular:     Rate and Rhythm: Normal rate and regular rhythm.     Pulses: Normal pulses.     Heart sounds: Normal heart sounds. No murmur heard.  No friction rub. No gallop.   Pulmonary:     Effort: Pulmonary effort is normal. No respiratory distress.     Breath sounds: Wheezing present. No rales.  Abdominal:     General: Abdomen is flat. Bowel sounds are normal. There is no distension.     Palpations: Abdomen is soft.     Tenderness: There is no abdominal tenderness.  Skin:    General: Skin is warm and dry.     Findings: Wound present.     Comments:  Bilateral medial ankle wounds, no drainage, bleeding  Neurological:     General: No focal deficit present.     Mental Status: She is alert and oriented to person, place, and time. Mental status is at baseline.  Psychiatric:        Mood and Affect: Mood normal.        Behavior: Behavior normal.        Thought Content: Thought content normal.        Judgment: Judgment normal.       Assessment & Plan:   See Encounters Tab for problem based charting.  Patient seen with Dr. Philipp Ovens

## 2020-10-04 NOTE — Addendum Note (Signed)
Addended by: Jodean Lima on: 10/04/2020 03:36 PM   Modules accepted: Level of Service

## 2020-10-04 NOTE — Patient Instructions (Addendum)
Ms. Leclere,  It was a pleasure meeting you today. Today we discussed the following:  1. COPD: I want you to continue taking the three medications for your COPD.  2. Hyponatremia (low sodium): While you were in the hospital this was found to be low, so we will recheck this today. I will give you a call if this is abnormal.   3. Leg ulcers: I want you to follow up in two weeks for ABI's and to manage your pain medications. I have sent in a two week prescription of the pain meds for the mean time.   Thank you for allowing [name] to be a part of your care!

## 2020-10-04 NOTE — Assessment & Plan Note (Addendum)
Patient was recently in the hospital for a COPD exacerbation. She was treated with prednisone and azithromycin and improved. Discharged home on her home supplemental oxygen of 2L. She was also started on triple therapy with Dulera, Duonebs and Incruse. She has been tolerating these medications well.  On exam there was mild expiratory wheezing.  Patient is currently not on her supplemental oxygen and states that she only requires this on exertion and at nighttime.  Plan: -Continue Dulera and Incruse daily -Continue DuoNebs and albuterol as needed

## 2020-10-04 NOTE — Assessment & Plan Note (Signed)
During her recent hospitalization found to have chronic SIADH. Na day of discharge was 132. She was encouraged to restrict free water intake. Will recheck BMP today.   Plan:  - F/u BMP

## 2020-10-05 LAB — BMP8+ANION GAP
Anion Gap: 11 mmol/L (ref 10.0–18.0)
BUN/Creatinine Ratio: 23 (ref 12–28)
BUN: 10 mg/dL (ref 8–27)
CO2: 27 mmol/L (ref 20–29)
Calcium: 9 mg/dL (ref 8.7–10.3)
Chloride: 92 mmol/L — ABNORMAL LOW (ref 96–106)
Creatinine, Ser: 0.44 mg/dL — ABNORMAL LOW (ref 0.57–1.00)
GFR calc Af Amer: 114 mL/min/{1.73_m2} (ref >59)
GFR calc non Af Amer: 99 mL/min/{1.73_m2} (ref >59)
Glucose: 81 mg/dL (ref 65–99)
Potassium: 5 mmol/L (ref 3.5–5.2)
Sodium: 130 mmol/L — ABNORMAL LOW (ref 134–144)

## 2020-10-09 ENCOUNTER — Telehealth: Payer: Self-pay | Admitting: *Deleted

## 2020-10-09 ENCOUNTER — Other Ambulatory Visit: Payer: Self-pay | Admitting: Internal Medicine

## 2020-10-09 DIAGNOSIS — J449 Chronic obstructive pulmonary disease, unspecified: Secondary | ICD-10-CM

## 2020-10-09 NOTE — Addendum Note (Signed)
Addended by: Mike Craze on: 10/09/2020 04:43 PM   Modules accepted: Orders

## 2020-10-09 NOTE — Telephone Encounter (Signed)
I have placed the DME order. Thanks!

## 2020-10-09 NOTE — Telephone Encounter (Signed)
Patient called in requesting portable oxygen. States current tanks are too heavy as she only weighs 92 lbs. It is keeping her from being mobile. States she spoke with [employer] and just needs order for portable tank. Will send CM to leah Lewis at [employer] to see exact order that needs to be placed. Hubbard Hartshorn, BSN, RN-BC

## 2020-10-09 NOTE — Telephone Encounter (Signed)
Theophilus Bones, RN; Sandi Raveling, Charlotte Sanes; 2 others   Cloretta Ned!   We will have to have an order for O2 Best Fit Evaluation. The one of our RT's will evaluate the patient and determine the best portable solution for her.

## 2020-10-09 NOTE — Progress Notes (Signed)
dme

## 2020-10-10 ENCOUNTER — Encounter

## 2020-10-10 NOTE — Telephone Encounter (Signed)
CM sent to Skeet Latch at [employer] for O2 Best Fit Evaluation. Hubbard Hartshorn, BSN, RN-BC

## 2020-10-10 NOTE — Telephone Encounter (Signed)
Theophilus Bones, RN; Sandi Raveling, Charlotte Sanes; 1 other   received, thanks

## 2020-10-11 ENCOUNTER — Other Ambulatory Visit: Payer: Self-pay | Admitting: Internal Medicine

## 2020-10-11 DIAGNOSIS — J449 Chronic obstructive pulmonary disease, unspecified: Secondary | ICD-10-CM

## 2020-10-16 ENCOUNTER — Other Ambulatory Visit: Payer: Self-pay | Admitting: Internal Medicine

## 2020-10-20 ENCOUNTER — Inpatient Hospital Stay: Admit: 2020-10-20 | Payer: MEDICARE | Primary: Family Medicine

## 2020-10-20 ENCOUNTER — Encounter

## 2020-10-20 DIAGNOSIS — Z136 Encounter for screening for cardiovascular disorders: Secondary | ICD-10-CM

## 2020-10-22 ENCOUNTER — Inpatient Hospital Stay (HOSPITAL_COMMUNITY): Payer: Medicare Other

## 2020-10-22 ENCOUNTER — Encounter (HOSPITAL_COMMUNITY): Payer: Self-pay

## 2020-10-22 ENCOUNTER — Emergency Department (HOSPITAL_COMMUNITY): Payer: Medicare Other

## 2020-10-22 ENCOUNTER — Emergency Department (HOSPITAL_BASED_OUTPATIENT_CLINIC_OR_DEPARTMENT_OTHER): Payer: Medicare Other

## 2020-10-22 ENCOUNTER — Telehealth: Payer: Self-pay | Admitting: *Deleted

## 2020-10-22 ENCOUNTER — Other Ambulatory Visit: Payer: Self-pay

## 2020-10-22 ENCOUNTER — Inpatient Hospital Stay (HOSPITAL_COMMUNITY)
Admission: EM | Admit: 2020-10-22 | Discharge: 2020-10-24 | DRG: 190 | Disposition: A | Discharge status: Home Health | Payer: Medicare Other | Attending: Internal Medicine | Admitting: Internal Medicine

## 2020-10-22 DIAGNOSIS — F17210 Nicotine dependence, cigarettes, uncomplicated: Secondary | ICD-10-CM | POA: Diagnosis present

## 2020-10-22 DIAGNOSIS — L97329 Non-pressure chronic ulcer of left ankle with unspecified severity: Secondary | ICD-10-CM | POA: Diagnosis present

## 2020-10-22 DIAGNOSIS — E43 Unspecified severe protein-calorie malnutrition: Secondary | ICD-10-CM | POA: Diagnosis present

## 2020-10-22 DIAGNOSIS — R9431 Abnormal electrocardiogram [ECG] [EKG]: Secondary | ICD-10-CM | POA: Diagnosis not present

## 2020-10-22 DIAGNOSIS — J441 Chronic obstructive pulmonary disease with (acute) exacerbation: Secondary | ICD-10-CM | POA: Diagnosis not present

## 2020-10-22 DIAGNOSIS — E871 Hypo-osmolality and hyponatremia: Secondary | ICD-10-CM | POA: Diagnosis not present

## 2020-10-22 DIAGNOSIS — Z20822 Contact with and (suspected) exposure to covid-19: Secondary | ICD-10-CM | POA: Diagnosis present

## 2020-10-22 DIAGNOSIS — J9621 Acute and chronic respiratory failure with hypoxia: Secondary | ICD-10-CM | POA: Diagnosis not present

## 2020-10-22 DIAGNOSIS — E222 Syndrome of inappropriate secretion of antidiuretic hormone: Secondary | ICD-10-CM | POA: Diagnosis present

## 2020-10-22 DIAGNOSIS — E46 Unspecified protein-calorie malnutrition: Secondary | ICD-10-CM

## 2020-10-22 DIAGNOSIS — R109 Unspecified abdominal pain: Secondary | ICD-10-CM | POA: Diagnosis not present

## 2020-10-22 DIAGNOSIS — J9622 Acute and chronic respiratory failure with hypercapnia: Secondary | ICD-10-CM | POA: Diagnosis not present

## 2020-10-22 DIAGNOSIS — R64 Cachexia: Secondary | ICD-10-CM | POA: Diagnosis not present

## 2020-10-22 DIAGNOSIS — M79609 Pain in unspecified limb: Secondary | ICD-10-CM | POA: Diagnosis not present

## 2020-10-22 DIAGNOSIS — G8929 Other chronic pain: Secondary | ICD-10-CM | POA: Diagnosis present

## 2020-10-22 DIAGNOSIS — Z681 Body mass index (BMI) 19 or less, adult: Secondary | ICD-10-CM

## 2020-10-22 DIAGNOSIS — Z9981 Dependence on supplemental oxygen: Secondary | ICD-10-CM | POA: Diagnosis not present

## 2020-10-22 DIAGNOSIS — M7989 Other specified soft tissue disorders: Secondary | ICD-10-CM | POA: Diagnosis not present

## 2020-10-22 DIAGNOSIS — F411 Generalized anxiety disorder: Secondary | ICD-10-CM | POA: Diagnosis present

## 2020-10-22 DIAGNOSIS — L97929 Non-pressure chronic ulcer of unspecified part of left lower leg with unspecified severity: Secondary | ICD-10-CM | POA: Diagnosis present

## 2020-10-22 DIAGNOSIS — J449 Chronic obstructive pulmonary disease, unspecified: Secondary | ICD-10-CM | POA: Diagnosis present

## 2020-10-22 DIAGNOSIS — Z79899 Other long term (current) drug therapy: Secondary | ICD-10-CM | POA: Diagnosis not present

## 2020-10-22 DIAGNOSIS — L97319 Non-pressure chronic ulcer of right ankle with unspecified severity: Secondary | ICD-10-CM | POA: Diagnosis present

## 2020-10-22 DIAGNOSIS — Z888 Allergy status to other drugs, medicaments and biological substances status: Secondary | ICD-10-CM

## 2020-10-22 DIAGNOSIS — I872 Venous insufficiency (chronic) (peripheral): Secondary | ICD-10-CM | POA: Diagnosis present

## 2020-10-22 DIAGNOSIS — I361 Nonrheumatic tricuspid (valve) insufficiency: Secondary | ICD-10-CM | POA: Diagnosis not present

## 2020-10-22 DIAGNOSIS — I878 Other specified disorders of veins: Secondary | ICD-10-CM | POA: Diagnosis present

## 2020-10-22 DIAGNOSIS — L97919 Non-pressure chronic ulcer of unspecified part of right lower leg with unspecified severity: Secondary | ICD-10-CM | POA: Diagnosis present

## 2020-10-22 LAB — BLOOD GAS, VENOUS
Acid-Base Excess: 5.6 mmol/L — ABNORMAL HIGH (ref 0.0–2.0)
Bicarbonate: 34.5 mmol/L — ABNORMAL HIGH (ref 20.0–28.0)
O2 Saturation: 22.5 %
Patient temperature: 98.6
pCO2, Ven: 75.4 mmHg (ref 44.0–60.0)
pH, Ven: 7.282 (ref 7.250–7.430)
pO2, Ven: <31 mmHg — CL (ref 32.0–45.0)

## 2020-10-22 LAB — CBC WITH DIFFERENTIAL/PLATELET
Abs Immature Granulocytes: 0.02 10*3/uL (ref 0.00–0.07)
Basophils Absolute: 0.1 10*3/uL (ref 0.0–0.1)
Basophils Relative: 1 %
Eosinophils Absolute: 0.6 10*3/uL — ABNORMAL HIGH (ref 0.0–0.5)
Eosinophils Relative: 8 %
HCT: 38.5 % (ref 36.0–46.0)
Hemoglobin: 12.2 g/dL (ref 12.0–15.0)
Immature Granulocytes: 0 %
Lymphocytes Relative: 34 %
Lymphs Abs: 2.3 10*3/uL (ref 0.7–4.0)
MCH: 30 pg (ref 26.0–34.0)
MCHC: 31.7 g/dL (ref 30.0–36.0)
MCV: 94.8 fL (ref 80.0–100.0)
Monocytes Absolute: 0.5 10*3/uL (ref 0.1–1.0)
Monocytes Relative: 7 %
Neutro Abs: 3.4 10*3/uL (ref 1.7–7.7)
Neutrophils Relative %: 50 %
Platelets: 266 10*3/uL (ref 150–400)
RBC: 4.06 MIL/uL (ref 3.87–5.11)
RDW: 12.6 % (ref 11.5–15.5)
WBC: 6.8 10*3/uL (ref 4.0–10.5)
nRBC: 0 % (ref 0.0–0.2)

## 2020-10-22 LAB — MRSA PCR SCREENING: MRSA by PCR: NEGATIVE

## 2020-10-22 LAB — BASIC METABOLIC PANEL
Anion gap: 9 (ref 5–15)
BUN: 8 mg/dL (ref 8–23)
CO2: 32 mmol/L (ref 22–32)
Calcium: 8.8 mg/dL — ABNORMAL LOW (ref 8.9–10.3)
Chloride: 91 mmol/L — ABNORMAL LOW (ref 98–111)
Creatinine, Ser: 0.61 mg/dL (ref 0.44–1.00)
GFR, Estimated: >60 mL/min (ref >60)
Glucose, Bld: 103 mg/dL — ABNORMAL HIGH (ref 70–99)
Potassium: 3.6 mmol/L (ref 3.5–5.1)
Sodium: 132 mmol/L — ABNORMAL LOW (ref 135–145)

## 2020-10-22 LAB — URINALYSIS, ROUTINE W REFLEX MICROSCOPIC
Bilirubin Urine: NEGATIVE
Glucose, UA: NEGATIVE mg/dL
Hgb urine dipstick: NEGATIVE
Ketones, ur: NEGATIVE mg/dL
Leukocytes,Ua: NEGATIVE
Nitrite: NEGATIVE
Protein, ur: NEGATIVE mg/dL
Specific Gravity, Urine: 1.014 (ref 1.005–1.030)
pH: 6 (ref 5.0–8.0)

## 2020-10-22 LAB — LACTIC ACID, PLASMA
Lactic Acid, Venous: 1.1 mmol/L (ref 0.5–1.9)
Lactic Acid, Venous: 1 mmol/L (ref 0.5–1.9)

## 2020-10-22 LAB — RESP PANEL BY RT-PCR (FLU A&B, COVID) ARPGX2
Influenza A by PCR: NEGATIVE
Influenza B by PCR: NEGATIVE
SARS Coronavirus 2 by RT PCR: NEGATIVE

## 2020-10-22 LAB — HEPATIC FUNCTION PANEL
ALT: 13 U/L (ref 0–44)
AST: 19 U/L (ref 15–41)
Albumin: 3.7 g/dL (ref 3.5–5.0)
Alkaline Phosphatase: 71 U/L (ref 38–126)
Bilirubin, Direct: 0.1 mg/dL (ref 0.0–0.2)
Indirect Bilirubin: 0.4 mg/dL (ref 0.3–0.9)
Total Bilirubin: 0.5 mg/dL (ref 0.3–1.2)
Total Protein: 6.6 g/dL (ref 6.5–8.1)

## 2020-10-22 LAB — SODIUM, URINE, RANDOM: Sodium, Ur: 25 mmol/L

## 2020-10-22 LAB — BRAIN NATRIURETIC PEPTIDE: B Natriuretic Peptide: 376.2 pg/mL — ABNORMAL HIGH (ref 0.0–100.0)

## 2020-10-22 LAB — CREATININE, URINE, RANDOM: Creatinine, Urine: 22.94 mg/dL

## 2020-10-22 LAB — MAGNESIUM: Magnesium: 1.6 mg/dL — ABNORMAL LOW (ref 1.7–2.4)

## 2020-10-22 LAB — TROPONIN I (HIGH SENSITIVITY): Troponin I (High Sensitivity): 14 ng/L (ref <18)

## 2020-10-22 MED ORDER — METHYLPREDNISOLONE SODIUM SUCC 40 MG IJ SOLR
40.0000 mg | Freq: Two times a day (BID) | INTRAMUSCULAR | Status: AC
  Administered 2020-10-23 (×2): 40 mg via INTRAVENOUS
  Filled 2020-10-22 (×2): qty 1

## 2020-10-22 MED ORDER — IPRATROPIUM-ALBUTEROL 0.5-2.5 (3) MG/3ML IN SOLN: 3.0000 mL | Freq: Four times a day (QID) | RESPIRATORY_TRACT | Status: DC | PRN

## 2020-10-22 MED ORDER — IOHEXOL 350 MG/ML SOLN
75.0000 mL | Freq: Once | INTRAVENOUS | Status: AC | PRN
  Administered 2020-10-22: 75 mL via INTRAVENOUS

## 2020-10-22 MED ORDER — MAGNESIUM SULFATE 2 GM/50ML IV SOLN
2.0000 g | Freq: Once | INTRAVENOUS | Status: AC
  Administered 2020-10-22: 2 g via INTRAVENOUS
  Filled 2020-10-22: qty 50

## 2020-10-22 MED ORDER — ORAL CARE MOUTH RINSE
15.0000 mL | Freq: Two times a day (BID) | OROMUCOSAL | Status: DC
  Administered 2020-10-23 – 2020-10-24 (×2): 15 mL via OROMUCOSAL

## 2020-10-22 MED ORDER — NICOTINE 21 MG/24HR TD PT24
21.0000 mg | MEDICATED_PATCH | Freq: Every day | TRANSDERMAL | Status: DC
  Administered 2020-10-23 – 2020-10-24 (×2): 21 mg via TRANSDERMAL
  Filled 2020-10-22 (×3): qty 1

## 2020-10-22 MED ORDER — PREDNISONE 20 MG PO TABS
40.0000 mg | ORAL_TABLET | Freq: Every day | ORAL | Status: DC
  Administered 2020-10-24: 40 mg via ORAL
  Filled 2020-10-22: qty 2

## 2020-10-22 MED ORDER — ACETAMINOPHEN 325 MG PO TABS: 650.0000 mg | ORAL_TABLET | Freq: Four times a day (QID) | ORAL | Status: DC | PRN

## 2020-10-22 MED ORDER — ACETAMINOPHEN 650 MG RE SUPP: 650.0000 mg | Freq: Four times a day (QID) | RECTAL | Status: DC | PRN

## 2020-10-22 MED ORDER — UMECLIDINIUM-VILANTEROL 62.5-25 MCG/INH IN AEPB
1.0000 | INHALATION_SPRAY | Freq: Every day | RESPIRATORY_TRACT | Status: DC
  Administered 2020-10-23: 13:00:00 1 via RESPIRATORY_TRACT
  Filled 2020-10-22 (×2): qty 14

## 2020-10-22 MED ORDER — IPRATROPIUM-ALBUTEROL 0.5-2.5 (3) MG/3ML IN SOLN
3.0000 mL | Freq: Three times a day (TID) | RESPIRATORY_TRACT | Status: DC
  Administered 2020-10-23 – 2020-10-24 (×5): 3 mL via RESPIRATORY_TRACT
  Filled 2020-10-22 (×5): qty 3

## 2020-10-22 MED ORDER — SODIUM CHLORIDE 0.9 % IV SOLN
75.0000 mL/h | INTRAVENOUS | Status: DC
  Administered 2020-10-22: 75 mL/h via INTRAVENOUS

## 2020-10-22 MED ORDER — OXYCODONE HCL 5 MG PO TABS
5.0000 mg | ORAL_TABLET | Freq: Three times a day (TID) | ORAL | Status: DC | PRN
  Administered 2020-10-22: 5 mg via ORAL
  Filled 2020-10-22: qty 1

## 2020-10-22 MED ORDER — OXYCODONE-ACETAMINOPHEN 5-325 MG PO TABS
1.0000 | ORAL_TABLET | Freq: Three times a day (TID) | ORAL | Status: DC | PRN
  Administered 2020-10-23 – 2020-10-24 (×5): 1 via ORAL
  Filled 2020-10-22 (×5): qty 1

## 2020-10-22 MED ORDER — ALBUTEROL SULFATE (2.5 MG/3ML) 0.083% IN NEBU
2.5000 mg | INHALATION_SOLUTION | RESPIRATORY_TRACT | Status: DC | PRN
  Administered 2020-10-23: 2.5 mg via RESPIRATORY_TRACT
  Filled 2020-10-22: qty 3

## 2020-10-22 MED ORDER — ENOXAPARIN SODIUM 30 MG/0.3ML ~~LOC~~ SOLN
30.0000 mg | SUBCUTANEOUS | Status: DC
  Administered 2020-10-22 – 2020-10-23 (×2): 30 mg via SUBCUTANEOUS
  Filled 2020-10-22 (×2): qty 0.3

## 2020-10-22 MED ORDER — IPRATROPIUM BROMIDE HFA 17 MCG/ACT IN AERS
2.0000 | INHALATION_SPRAY | Freq: Once | RESPIRATORY_TRACT | Status: AC
  Administered 2020-10-22: 2 via RESPIRATORY_TRACT
  Filled 2020-10-22: qty 12.9

## 2020-10-22 MED ORDER — GUAIFENESIN ER 600 MG PO TB12
600.0000 mg | ORAL_TABLET | Freq: Two times a day (BID) | ORAL | Status: DC
  Administered 2020-10-22 – 2020-10-23 (×2): 600 mg via ORAL
  Filled 2020-10-22 (×2): qty 1

## 2020-10-22 MED ORDER — METHYLPREDNISOLONE SODIUM SUCC 125 MG IJ SOLR
125.0000 mg | Freq: Once | INTRAMUSCULAR | Status: AC
  Administered 2020-10-22: 125 mg via INTRAVENOUS
  Filled 2020-10-22: qty 2

## 2020-10-22 MED ORDER — ALBUTEROL SULFATE HFA 108 (90 BASE) MCG/ACT IN AERS
8.0000 | INHALATION_SPRAY | Freq: Once | RESPIRATORY_TRACT | Status: AC
  Administered 2020-10-22: 8 via RESPIRATORY_TRACT
  Filled 2020-10-22: qty 6.7

## 2020-10-22 MED ORDER — IPRATROPIUM-ALBUTEROL 0.5-2.5 (3) MG/3ML IN SOLN
3.0000 mL | Freq: Once | RESPIRATORY_TRACT | Status: AC
  Administered 2020-10-22: 3 mL via RESPIRATORY_TRACT
  Filled 2020-10-22: qty 3

## 2020-10-22 MED ORDER — MAGNESIUM SULFATE IN D5W 1-5 GM/100ML-% IV SOLN
1.0000 g | Freq: Once | INTRAVENOUS | Status: DC
Start: 2020-10-22 — End: 2020-10-22

## 2020-10-22 MED ORDER — CHLORHEXIDINE GLUCONATE CLOTH 2 % EX PADS
6.0000 | MEDICATED_PAD | Freq: Every day | CUTANEOUS | Status: DC
  Administered 2020-10-24: 6 via TOPICAL

## 2020-10-22 MED ORDER — DOXYCYCLINE HYCLATE 100 MG PO TABS
100.0000 mg | ORAL_TABLET | Freq: Two times a day (BID) | ORAL | Status: DC
  Administered 2020-10-22 – 2020-10-24 (×4): 100 mg via ORAL
  Filled 2020-10-22 (×5): qty 1

## 2020-10-22 MED ORDER — OXYCODONE-ACETAMINOPHEN 10-325 MG PO TABS: 1.0000 | ORAL_TABLET | Freq: Three times a day (TID) | ORAL | Status: DC | PRN

## 2020-10-22 MED ORDER — BUSPIRONE HCL 5 MG PO TABS
10.0000 mg | ORAL_TABLET | Freq: Every day | ORAL | Status: DC | PRN
  Administered 2020-10-22 – 2020-10-23 (×2): 10 mg via ORAL
  Filled 2020-10-22 (×2): qty 1

## 2020-10-22 NOTE — Telephone Encounter (Signed)
I agree

## 2020-10-22 NOTE — ED Notes (Addendum)
Date and time results received: 10/22/20 1954 (use smartphrase ".now" to insert current time)  Test: O2 Critical Value:19.5 (VBG)  Name of Provider Notified: Dykstra MD Orders Received? Or Actions Taken?:no new orders

## 2020-10-22 NOTE — Telephone Encounter (Signed)
[facility] nurse angel calls [phone] when nurse arrived pt was on porch sitting no 02 sat low 50's, went in house, placed 02 on pt after 30 mins SAT 60's, 02 to 4.5 L/min SAT went to 70's now after 68mn SAT 88%. Increased cough, sputum production. Denies pain. BP 112/80 HR 97.  Advised to call 911 and pt be eval in ED do you agree?

## 2020-10-22 NOTE — ED Notes (Signed)
Report given to [place], Therapist, sports.  Pt SBAR information covered at this time.  Pt resting quietly in bed.  NADN.  Will continue to monitor.

## 2020-10-22 NOTE — ED Provider Notes (Signed)
[facility] DEPT Provider Note   CSN: [id number] Arrival date & time: 10/22/20  1436     History Chief Complaint  Patient presents with  . Shortness of Breath    Kristen [name] is a 74 y.o. female.  74 yo F with a chief complaint shortness of breath.  Is been going on for the past couple days.  Actually months she has been having significant shortness of breath with her urged her she does still be Covid a few steps in the morning.  Hospital making it to the bathroom and back without having prolonged. Cough with sputum just over 24 hours.   Seen by EMS with hypoxia on her 2L of home O2 down into the 70's.  Improved with 6L.   The history is provided by the patient.  Shortness of Breath Severity:  Moderate Onset quality:  Gradual Duration:  2 days Timing:  Constant Progression:  Worsening Chronicity:  New Relieved by:  Nothing Worsened by:  Nothing Ineffective treatments:  None tried Associated symptoms: cough   Associated symptoms: no chest pain, no fever, no headaches, no vomiting and no wheezing        Past Medical History:  Diagnosis Date  . Cellulitis   . Chronic venous stasis dermatitis of both lower extremities    Chronic problem that was being manged by PCP in [place]  . COPD (chronic obstructive pulmonary disease) ([place])   . Dyspnea   . 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 Lower extremity ulceration ([place]) 10/04/2020  . COPD exacerbation ([place]) 09/25/2020  . Generalized anxiety disorder 08/27/2020  . 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 Smoking 07/18/2020  . Venous insufficiency of both lower extremities 07/18/2020  . COPD (chronic obstructive pulmonary disease) ([place]) 06/28/2020  . Protein-calorie malnutrition ([place]) 06/28/2020  . Hyponatremia 06/28/2020    Past Surgical History:  Procedure Laterality Date  . VASCULAR SURGERY       OB History   No  obstetric history on file.     Family History  Problem Relation Age of Onset  . Diabetes Mother   . Diabetes Mellitus II Mother   . Diabetes Father   . Diverticulitis Sister     Social History   Tobacco Use  . Smoking status: Current Every Day Smoker    Packs/day: 0.25    Types: Cigarettes  . Smokeless tobacco: Never Used  . Tobacco comment: 2-3 per day   Vaping Use  . Vaping Use: Never used  Substance Use Topics  . Alcohol use: Yes    Comment: occasionally  . Drug use: Never    Home Medications Prior to Admission medications   Medication Sig Start Date End Date Taking? Authorizing Provider  acetaminophen (TYLENOL) 325 MG tablet Take 650 mg by mouth every 6 (six) hours as needed for mild pain or headache.    [provider]  albuterol (VENTOLIN HFA) 108 (90 Base) MCG/ACT inhaler TAKE 2 PUFFS BY MOUTH EVERY 6 HOURS AS NEEDED FOR WHEEZE OR SHORTNESS OF BREATH 10/21/20   Rehman, Areeg N, DO  busPIRone (BUSPAR) 10 MG tablet Take 1 tablet (10 mg total) by mouth daily as needed (anxiety). 08/27/20 11/25/20  Lacinda Axon, MD  feeding supplement (ENSURE ENLIVE / ENSURE PLUS) LIQD Take 237 mLs by mouth 3 (three) times daily between meals. 10/04/20 01/02/21  Rehman, Areeg N, DO  fluticasone (FLONASE) 50  MCG/ACT nasal spray Place 1 spray into both nostrils daily as needed for allergies or rhinitis.    [provider]  guaiFENesin (MUCINEX) 600 MG 12 hr tablet Take 1 tablet (600 mg total) by mouth 2 (two) times daily. 07/03/20   Bloomfield, Carley D, DO  ipratropium-albuterol (DUONEB) 0.5-2.5 (3) MG/3ML SOLN Take 3 mLs by nebulization every 6 (six) hours as needed. Patient taking differently: Take 3 mLs by nebulization every 6 (six) hours as needed (breathing).  09/02/20 12/01/20  Lacinda Axon, MD  mometasone-formoterol (DULERA) 200-5 MCG/ACT AERO Inhale 1 puff into the lungs 2 (two) times daily. 07/03/20   Bloomfield, Nila Nephew D, DO  Multiple Vitamin (MULTIVITAMIN  WITH MINERALS) TABS tablet Take 1 tablet by mouth daily.    [provider]  nicotine (NICODERM CQ - DOSED IN MG/24 HOURS) 21 mg/24hr patch Place 1 patch (21 mg total) onto the skin daily. Patient not taking: Reported on 09/25/2020 07/03/20   Modena Nunnery D, DO  oxyCODONE-acetaminophen (PERCOCET) 10-325 MG tablet Take 1 tablet by mouth every 8 (eight) hours as needed for pain. 10/04/20 11/03/20  Velna Ochs, MD  potassium chloride (KLOR-CON) 10 MEQ tablet Take 1 tablet (10 mEq total) by mouth daily. 07/03/20   Bloomfield, Carley D, DO  umeclidinium bromide (INCRUSE ELLIPTA) 62.5 MCG/INH AEPB Inhale 1 puff into the lungs daily. 09/29/20   [name], MD    Allergies    Gabapentin  Review of Systems   Review of Systems  Constitutional: Negative for chills and fever.  HENT: Negative for congestion and rhinorrhea.   Eyes: Negative for redness and visual disturbance.  Respiratory: Positive for cough and shortness of breath. Negative for wheezing.   Cardiovascular: Positive for leg swelling. Negative for chest pain and palpitations.  Gastrointestinal: Negative for nausea and vomiting.  Genitourinary: Negative for dysuria and urgency.  Musculoskeletal: Negative for arthralgias and myalgias.  Skin: Negative for pallor and wound.  Neurological: Negative for dizziness and headaches.    Physical Exam Updated Vital Signs BP (!) 149/83   Pulse 78   Temp 97.8 F (36.6 C) (Oral)   Resp 15   Ht _0  (1.778 m)   Wt 42 kg   SpO2 100%   BMI 13.29 kg/m   Physical Exam Vitals and nursing note reviewed.  Constitutional:      General: She is not in acute distress.    Appearance: She is well-developed. She is not diaphoretic.     Comments: Cachectic.   HENT:     Head: Normocephalic and atraumatic.  Eyes:     Pupils: Pupils are equal, round, and reactive to light.  Cardiovascular:     Rate and Rhythm: Normal rate and regular rhythm.     Heart sounds: No murmur  heard.  No friction rub. No gallop.   Pulmonary:     Effort: Pulmonary effort is normal.     Breath sounds: Wheezing (diffuse with prolonged expiratory effort) present. No rales.  Abdominal:     General: There is no distension.     Palpations: Abdomen is soft.     Tenderness: There is no abdominal tenderness.  Musculoskeletal:        General: No tenderness.     Cervical back: Normal range of motion and neck supple.     Right lower leg: Edema present.     Left lower leg: Edema present.     Comments: L >R leg chronic appearing wound with erythema to the medial malleolus bilaterally.  Skin:    General: Skin is warm and dry.  Neurological:     Mental Status: She is alert and oriented to person, place, and time.  Psychiatric:        Behavior: Behavior normal.     ED Results / Procedures / Treatments   Labs (all labs ordered are listed, but only abnormal results are displayed) Labs Reviewed  CBC WITH DIFFERENTIAL/PLATELET - Abnormal; Notable for the following components:      Result Value   Eosinophils Absolute 0.6 (*)    All other components within normal limits  BASIC METABOLIC PANEL - Abnormal; Notable for the following components:   Sodium 132 (*)    Chloride 91 (*)    Glucose, Bld 103 (*)    Calcium 8.8 (*)    All other components within normal limits  BRAIN NATRIURETIC PEPTIDE - Abnormal; Notable for the following components:   B Natriuretic Peptide 376.2 (*)    All other components within normal limits  RESP PANEL BY RT-PCR (FLU A&B, COVID) ARPGX2  CULTURE, BLOOD (ROUTINE X 2)  CULTURE, BLOOD (ROUTINE X 2)  LACTIC ACID, PLASMA  LACTIC ACID, PLASMA  TROPONIN I (HIGH SENSITIVITY)    EKG EKG Interpretation  Date/Time:  Tuesday October 22 2020 14:55:55 EST Ventricular Rate:  78 PR Interval:    QRS Duration: 105 QT Interval:  388 QTC Calculation: 442 R Axis:   82 Text Interpretation: Sinus rhythm Borderline right axis deviation Abnormal T, consider ischemia,  inferior leads ST elevation, consider anterolateral injury No significant change since last tracing Confirmed by Deno Etienne [phone]) on 10/22/2020 3:09:08 PM   Radiology DG Chest Port 1 View  Result Date: 10/22/2020 CLINICAL DATA:  Hypoxia, COPD, tobacco abuse EXAM: PORTABLE CHEST 1 VIEW COMPARISON:  09/25/2020 FINDINGS: Single frontal view of the chest demonstrates an unremarkable cardiac silhouette. Stable background emphysema without airspace disease, effusion, or pneumothorax. No acute bony abnormalities. IMPRESSION: 1. Stable emphysema.  No acute process. Electronically Signed   By: Randa Ngo M.D.   On: 10/22/2020 15:33    Procedures Procedures (including critical care time)  Medications Ordered in ED Medications  methylPREDNISolone sodium succinate (SOLU-MEDROL) 125 mg/2 mL injection 125 mg (125 mg Intravenous Given 10/22/20 1543)  magnesium sulfate IVPB 2 g 50 mL (0 g Intravenous Stopped 10/22/20 1653)  albuterol (VENTOLIN HFA) 108 (90 Base) MCG/ACT inhaler 8 puff (8 puffs Inhalation Given 10/22/20 1543)  ipratropium (ATROVENT HFA) inhaler 2 puff (2 puffs Inhalation Given 10/22/20 1555)    ED Course  I have reviewed the triage vital signs and the nursing notes.  Pertinent labs & imaging results that were available during my care of the patient were reviewed by me and considered in my medical decision making (see chart for details).    MDM Rules/Calculators/A&P                          74 yo F with a cc of sob.  Found to be hypoxic on her home O2.  Improved with titration up to 6 L.  Titrated down to 4 here at rest without issue.  I am concerned about a pulmonary embolism as the patient has left lower extremity edema greater than right that is new and shortness of breath with new hypoxia.  She does have some wheezing on exam as well we will give albuterol and Atrovent.  Solu-Medrol and magnesium.  Signed out to Dr. Roslynn Amble, please see their note for  further details of care  in the ED.    The patients results and plan were reviewed and discussed.   Any x-rays performed were independently reviewed by myself.   Differential diagnosis were considered with the presenting HPI.  Medications  methylPREDNISolone sodium succinate (SOLU-MEDROL) 125 mg/2 mL injection 125 mg (125 mg Intravenous Given 10/22/20 1543)  magnesium sulfate IVPB 2 g 50 mL (0 g Intravenous Stopped 10/22/20 1653)  albuterol (VENTOLIN HFA) 108 (90 Base) MCG/ACT inhaler 8 puff (8 puffs Inhalation Given 10/22/20 1543)  ipratropium (ATROVENT HFA) inhaler 2 puff (2 puffs Inhalation Given 10/22/20 1555)    Vitals:   10/22/20 1456 10/22/20 1503  BP: (!) 149/83   Pulse: 78   Resp: 15   Temp: 97.8 F (36.6 C)   TempSrc: Oral   SpO2: 100%   Weight:  42 kg  Height:  _0  (1.778 m)    Final diagnoses:  Acute on chronic respiratory failure with hypoxia (HCC)      Final Clinical Impression(s) / ED Diagnoses Final diagnoses:  Acute on chronic respiratory failure with hypoxia [facility])    Rx / DC Orders ED Discharge Orders    None       Deno Etienne, DO 10/22/20 1653

## 2020-10-22 NOTE — ED Notes (Addendum)
Date and time results received: 10/22/20 1954 (use smartphrase ".now" to insert current time)  Test: CO2 Critical Value: 75.4 Name of Provider Notified: dykdtra Md  Orders Received? Or Actions Taken?: no new orders

## 2020-10-22 NOTE — ED Triage Notes (Signed)
[facility] coming from home. Home health provider called EMS due to low O2 sat. HH was there for wound care and on 2L O2 continually and sat was in the 80's. HH put her on 6L at home and got her up to 94% and completed duo neb at home. LS clear, 97 % at this time on 4 L [place]. Vitals 150/90 H 88 T 100.4 R 22 Hx copd,emphysema

## 2020-10-22 NOTE — Progress Notes (Signed)
Left lower extremity venous duplex complete.  Please see CV proc tab for preliminary results. [place], RVT 6:52 PM  10/22/2020

## 2020-10-22 NOTE — H&P (Signed)
Kristen [name] IDC:[id number] DOB: November 17, 1946 DOA: 10/22/2020     PCP: System, Provider Not In  Mike Craze, DO Teaching  Residency Program Outpatient Specialists:   Patient arrived to ER on 10/22/20 at 1436 Referred by Attending Lucrezia Starch, MD   Patient coming from: home Lives  With family originally from [place]:  Chief Complaint  Patient presents with  . Shortness of Breath    HPI: Kristen [name] is a 74 y.o. female with medical history significant of COPD, Chronic respiratory failure on O2 2L, venous stasis, hyponatremia lower extremity ulcers    Presented with noted to be hypoxic today down to low 50s when she was found by home care nurse sitting on her porch.  Nurse increased her home oxygen to 4-1/2 L and O2 sats went up to 70s and then eventually up to 88 Patient has had increased cough and sputum production They were advised to call 911 On EMS arrival increased oxygen up to 6 L satting 94 got duo nebs and improved to 97 was able to dial down to 4 L Patient was notably wheezing Low-grade fever 100.4 Patient has had some leg swelling cough and increased shortness of breath Reports her O2 tank is not working properly Quit smoking few days ago Would like to have a nicotine patch Reports her abdomen has been hurting she have had some nausea  Infectious risk factors:  Reports  fever, shortness of breath, dry cough     Has  NOt been vaccinated against COVID    Initial COVID TEST  NEGATIVE   Lab Results  Component Value Date   [place] 10/22/2020   [place] NEGATIVE 09/25/2020   [place] NEGATIVE 06/28/2020    Regarding pertinent Chronic problems       COPD - not followed by pulmonology   on baseline oxygen  2L,      While in ER: Noted to have leg edema left worse than right Dopplers done showed no evidence of DVT   CTA negative for PE or infiltrate Treated for COPD exacerbation Due to lack of beds at  [facility] hospitalist was called for an admission at [facility] was called for admission for COPD exacerbation  The following Work up has been ordered so far:  Orders Placed This Encounter  Procedures  . Blood culture (routine x 2)  . Resp Panel by RT-PCR (Flu A&B, Covid) Nasopharyngeal Swab  . DG Chest Port 1 View  . CT Angio Chest PE W and/or Wo Contrast  . CBC with Differential  . Basic metabolic panel  . Brain natriuretic peptide  . Lactic acid, plasma  . Blood gas, venous (at [facility] and [facility], not at [facility])  . Consult to hospitalist  ALL PATIENTS BEING ADMITTED/HAVING PROCEDURES NEED COVID-19 SCREENING  . EKG 12-Lead  . VAS [name] LOWER EXTREMITY VENOUS (DVT)    Following Medications were ordered in ER: Medications  ipratropium-albuterol (DUONEB) 0.5-2.5 (3) MG/3ML nebulizer solution 3 mL (has no administration in time range)  methylPREDNISolone sodium succinate (SOLU-MEDROL) 125 mg/2 mL injection 125 mg (125 mg Intravenous Given 10/22/20 1543)  magnesium sulfate IVPB 2 g 50 mL (0 g Intravenous Stopped 10/22/20 1653)  albuterol (VENTOLIN HFA) 108 (90 Base) MCG/ACT inhaler 8 puff (8 puffs Inhalation Given 10/22/20 1543)  ipratropium (ATROVENT HFA) inhaler 2 puff (2 puffs Inhalation Given 10/22/20 1555)  iohexol (OMNIPAQUE) 350 MG/ML injection 75 mL (75 mLs Intravenous Contrast Given  10/22/20 1801)        Consult Orders  (From admission, onward)         Start     Ordered   10/22/20 1911  Consult to hospitalist  ALL PATIENTS BEING ADMITTED/HAVING PROCEDURES NEED COVID-19 SCREENING  Once       Comments: ALL PATIENTS BEING ADMITTED/HAVING PROCEDURES NEED COVID-19 SCREENING  Provider:  (Not yet assigned)  Question Answer Comment  Place call to: [facility]   Reason for Consult Admit      10/22/20 1910          Significant initial  Findings: Abnormal Labs Reviewed  CBC WITH DIFFERENTIAL/PLATELET - Abnormal; Notable for the following components:       Result Value   Eosinophils Absolute 0.6 (*)    All other components within normal limits  BASIC METABOLIC PANEL - Abnormal; Notable for the following components:   Sodium 132 (*)    Chloride 91 (*)    Glucose, Bld 103 (*)    Calcium 8.8 (*)    All other components within normal limits  BRAIN NATRIURETIC PEPTIDE - Abnormal; Notable for the following components:   B Natriuretic Peptide 376.2 (*)    All other components within normal limits    Otherwise labs showing:    Recent Labs  Lab 10/22/20 1511  NA 132*  K 3.6  CO2 32  GLUCOSE 103*  BUN 8  CREATININE 0.61  CALCIUM 8.8*    Cr   stable,    Lab Results  Component Value Date   CREATININE 0.61 10/22/2020   CREATININE 0.44 (L) 10/04/2020   CREATININE 0.74 09/29/2020    No results for input(s): AST, ALT, ALKPHOS, BILITOT, PROT, ALBUMIN in the last 168 hours. Lab Results  Component Value Date   CALCIUM 8.8 (L) 10/22/2020   PHOS 2.1 (L) 06/30/2020     WBC      Component Value Date/Time   WBC 6.8 10/22/2020 1511   LYMPHSABS 2.3 10/22/2020 1511   MONOABS 0.5 10/22/2020 1511   EOSABS 0.6 (H) 10/22/2020 1511   BASOSABS 0.1 10/22/2020 1511    Plt: Lab Results  Component Value Date   PLT 266 10/22/2020     Lactic Acid, Venous    Component Value Date/Time   LATICACIDVEN 1.0 10/22/2020 1630        COVID-19 Labs     Venous  Blood Gas result:  pH 7.282  pCO2 75.4     HG/HCT  Stable      Component Value Date/Time   HGB 12.2 10/22/2020 1511   HCT 38.5 10/22/2020 1511   MCV 94.8 10/22/2020 1511   Troponin  14     ECG: Ordered Personally reviewed by me showing: HR : 78 Rhythm:  NSR R hypertrophy  nonspecific changes,  QTC 442   BNP (last 3 results) Recent Labs    10/22/20 1511  BNP 376.2*       UA  no evidence of UTI     Urine analysis:    Component Value Date/Time   COLORURINE YELLOW 10/22/2020 2100   APPEARANCEUR CLEAR 10/22/2020 2100   [place] 1.014 10/22/2020 2100   [place] 6.0  10/22/2020 2100   [place] 10/22/2020 2100   [place] NEGATIVE 10/22/2020 2100   [place] NEGATIVE 10/22/2020 2100   [place] NEGATIVE 10/22/2020 2100   PROTEINUR NEGATIVE 10/22/2020 2100   NITRITE NEGATIVE 10/22/2020 2100   LEUKOCYTESUR NEGATIVE 10/22/2020 2100      Ordered   CXR -  emphysema   CTA chest -  nonacute, no PE,  no evidence of infiltrate  mucous plugging  Emphysema   ED Triage Vitals  Enc Vitals Group     BP 10/22/20 1456 (!) 149/83     Pulse Rate 10/22/20 1456 78     Resp 10/22/20 1456 15     Temp 10/22/20 1456 97.8 F (36.6 C)     Temp Source 10/22/20 1456 Oral     SpO2 10/22/20 1456 100 %     Weight 10/22/20 1503 92 lb 9.5 oz (42 kg)     Height 10/22/20 1503 5' 10" (1.778 m)     Head Circumference --      Peak Flow --      Pain Score 10/22/20 1503 5     Pain Loc --      Pain Edu? --      Excl. in [place]? --   TMAX(24)@       Latest  Blood pressure 131/67, pulse 79, temperature 97.8 F (36.6 C), temperature source Oral, resp. rate 12, height 5' 10" (1.778 m), weight 42 kg, SpO2 99 %.     Review of Systems:    Pertinent positives include:    Fevers, chills, fatigue  Constitutional:  No weight loss, night sweats,, weight loss  HEENT:  No headaches, Difficulty swallowing,Tooth/dental problems,Sore throat,  No sneezing, itching, ear ache, nasal congestion, post nasal drip,  Cardio-vascular:  No chest pain, Orthopnea, PND, anasarca, dizziness, palpitations.no Bilateral lower extremity swelling  GI:  No heartburn, indigestion, abdominal pain, nausea, vomiting, diarrhea, change in bowel habits, loss of appetite, melena, blood in stool, hematemesis Resp:  no shortness of breath at rest. No dyspnea on exertion, No excess mucus, no productive cough, No non-productive cough, No coughing up of blood.No change in color of mucus.No wheezing. Skin:  no rash or lesions. No jaundice GU:  no dysuria, change in color of urine, no urgency or frequency. No  straining to urinate.  No flank pain.  Musculoskeletal:  No joint pain or no joint swelling. No decreased range of motion. No back pain.  Psych:  No change in mood or affect. No depression or anxiety. No memory loss.  Neuro: no localizing neurological complaints, no tingling, no weakness, no double vision, no gait abnormality, no slurred speech, no confusion  All systems reviewed and apart from [place] all are negative  Past Medical History:   Past Medical History:  Diagnosis Date  . Cellulitis   . Chronic venous stasis dermatitis of both lower extremities    Chronic problem that was being manged by PCP in [place]  . COPD (chronic obstructive pulmonary disease) ([place])   . Dyspnea   . 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 VASCULAR SURGERY      Social History:  Ambulatory   independently       reports that she has been smoking cigarettes. She has been smoking about 0.25 packs per day. She has never used smokeless tobacco. She reports current alcohol use. She reports that she does not use drugs.    Family History:   Family History  Problem Relation Age of Onset  . Diabetes Mother   . Diabetes Mellitus II Mother   . Diabetes Father   . Diverticulitis Sister     Allergies: Allergies  Allergen Reactions  . Gabapentin Other (See Comments)    Hallucination/ nightmares  Prior to Admission medications   Medication Sig Start Date End Date Taking? Authorizing Provider  acetaminophen (TYLENOL) 325 MG tablet Take 650 mg by mouth every 6 (six) hours as needed for mild pain or headache.    [provider]  albuterol (VENTOLIN HFA) 108 (90 Base) MCG/ACT inhaler TAKE 2 PUFFS BY MOUTH EVERY 6 HOURS AS NEEDED FOR WHEEZE OR SHORTNESS OF BREATH 10/21/20   Rehman, Areeg N, DO  busPIRone (BUSPAR) 10 MG tablet Take 1 tablet (10 mg total) by mouth daily as needed (anxiety). 08/27/20 11/25/20  Lacinda Axon, MD    feeding supplement (ENSURE ENLIVE / ENSURE PLUS) LIQD Take 237 mLs by mouth 3 (three) times daily between meals. 10/04/20 01/02/21  Rehman, Areeg N, DO  fluticasone (FLONASE) 50 MCG/ACT nasal spray Place 1 spray into both nostrils daily as needed for allergies or rhinitis.    [provider]  guaiFENesin (MUCINEX) 600 MG 12 hr tablet Take 1 tablet (600 mg total) by mouth 2 (two) times daily. 07/03/20   [place]omfield, Carley D, DO  ipratropium-albuterol (DUONEB) 0.5-2.5 (3) MG/3ML SOLN Take 3 mLs by nebulization every 6 (six) hours as needed. Patient taking differently: Take 3 mLs by nebulization every 6 (six) hours as needed (breathing).  09/02/20 12/01/20  Lacinda Axon, MD  mometasone-formoterol (DULERA) 200-5 MCG/ACT AERO Inhale 1 puff into the lungs 2 (two) times daily. 07/03/20   [place]omfield, Nila Nephew D, DO  Multiple Vitamin (MULTIVITAMIN WITH MINERALS) TABS tablet Take 1 tablet by mouth daily.    [provider]  nicotine (NICODERM CQ - DOSED IN MG/24 HOURS) 21 mg/24hr patch Place 1 patch (21 mg total) onto the skin daily. Patient not taking: Reported on 09/25/2020 07/03/20   Modena Nunnery D, DO  oxyCODONE-acetaminophen (PERCOCET) 10-325 MG tablet Take 1 tablet by mouth every 8 (eight) hours as needed for pain. 10/04/20 11/03/20  Velna Ochs, MD  potassium chloride (KLOR-CON) 10 MEQ tablet Take 1 tablet (10 mEq total) by mouth daily. 07/03/20   [place]omfield, Carley D, DO  umeclidinium bromide (INCRUSE ELLIPTA) 62.5 MCG/INH AEPB Inhale 1 puff into the lungs daily. 09/29/20   [name], MD   Physical Exam: Vitals with BMI 10/22/2020 10/22/2020 10/22/2020  Height - - _0   Weight - - 92 lbs 9 oz  BMI - - 47.65  Systolic 465 035 -  Diastolic 67 69 -  Pulse 79 91 -    1. General:  in No  Acute distress   Chronically ill  -appearing 2. Psychological: Alert and  Oriented 3. Head/ENT:    Dry Mucous Membranes                          Head Non traumatic, neck  supple                          Poor Dentition 4. SKIN:  decreased Skin turgor,  Skin clean Dry bilateral ankle ulcerations 5. Heart: Regular rate and rhythm no  Murmur, no Rub or gallop 6. Lungs:   no wheezes or crackles   7. Abdomen: Soft,  non-tender, Non distended  bowel sounds present 8. Lower extremities: no clubbing, cyanosis, no edema, palpable pulses 9. Neurologically Grossly intact, moving all 4 extremities equally   10. MSK: Normal range of motion   All other LABS:     Recent Labs  Lab 10/22/20 1511  WBC 6.8  NEUTROABS 3.4  HGB  12.2  HCT 38.5  MCV 94.8  PLT 266     Recent Labs  Lab 10/22/20 1511  NA 132*  K 3.6  CL 91*  CO2 32  GLUCOSE 103*  BUN 8  CREATININE 0.61  CALCIUM 8.8*     No results for input(s): AST, ALT, ALKPHOS, BILITOT, PROT, ALBUMIN in the last 168 hours.     Cultures: No results found for: SDES, SPECREQUEST, CULT, REPTSTATUS   Radiological Exams on Admission: CT Angio Chest PE W and/or Wo Contrast  Result Date: 10/22/2020 CLINICAL DATA:  Hypoxia. EXAM: CT ANGIOGRAPHY CHEST WITH CONTRAST TECHNIQUE: Multidetector CT imaging of the chest was performed using the standard protocol during bolus administration of intravenous contrast. Multiplanar CT image reconstructions and MIPs were obtained to evaluate the vascular anatomy. CONTRAST:  55m OMNIPAQUE IOHEXOL 350 MG/ML SOLN COMPARISON:  September 26, 2020. FINDINGS: Cardiovascular: Satisfactory opacification of the pulmonary arteries to the segmental level. No evidence of pulmonary embolism. Normal heart size. No pericardial effusion. Atherosclerosis of thoracic aorta is noted without aneurysm formation. Mediastinum/Nodes: No enlarged mediastinal, hilar, or axillary lymph nodes. Thyroid gland, trachea, and esophagus demonstrate no significant findings. Lungs/Pleura: No pneumothorax or pleural effusion is noted. Hyperexpansion of the lungs is noted. Emphysematous disease is noted in the upper lobes.  Abnormal soft tissue density is noted in the right lower lobe bronchi concerning for mucous plugging or aspiration. Stable irregular opacity is seen in right middle lobe concerning for scarring or atypical inflammation. Upper Abdomen: No acute abnormality. Musculoskeletal: No chest wall abnormality. No acute or significant osseous findings. Review of the MIP images confirms the above findings. IMPRESSION: 1. No definite evidence of pulmonary embolus. 2. Hyperexpansion of the lungs is noted. 3. Abnormal soft tissue density is noted in the right lower lobe bronchi concerning for mucous plugging or aspiration. 4. Stable irregular opacity is seen in right middle lobe concerning for scarring or atypical inflammation. 5. Emphysema and aortic atherosclerosis. Aortic Atherosclerosis (ICD10-I70.0) and Emphysema (ICD10-J43.9). Electronically Signed   By: JMarijo ConceptionM.D.   On: 10/22/2020 19:02   DG Chest Port 1 View  Result Date: 10/22/2020 CLINICAL DATA:  Hypoxia, COPD, tobacco abuse EXAM: PORTABLE CHEST 1 VIEW COMPARISON:  09/25/2020 FINDINGS: Single frontal view of the chest demonstrates an unremarkable cardiac silhouette. Stable background emphysema without airspace disease, effusion, or pneumothorax. No acute bony abnormalities. IMPRESSION: 1. Stable emphysema.  No acute process. Electronically Signed   By: MRanda NgoM.D.   On: 10/22/2020 15:33   VAS [place]OWER EXTREMITY VENOUS (DVT)  Result Date: 10/22/2020  Lower Venous DVT Study Indications: Pain, and Swelling.  Limitations: Body habitus. Performing Technologist: CAntonieta PertRDMS, RVT  Examination Guidelines: A complete evaluation includes B-mode imaging, spectral Doppler, color Doppler, and power Doppler as needed of all accessible portions of each vessel. Bilateral testing is considered an integral part of a complete examination. Limited examinations for reoccurring indications may be performed as noted. The reflux portion of the exam is  performed with the patient in reverse Trendelenburg.  +-----+---------------+---------+-----------+----------+-------------------+ RIGHTCompressibilityPhasicitySpontaneityPropertiesThrombus Aging      +-----+---------------+---------+-----------+----------+-------------------+ CFV                                               Not well visualized +-----+---------------+---------+-----------+----------+-------------------+   +---------+---------------+---------+-----------+----------+--------------+ LEFT     CompressibilityPhasicitySpontaneityPropertiesThrombus Aging +---------+---------------+---------+-----------+----------+--------------+ CFV      Full  Yes      Yes                                 +---------+---------------+---------+-----------+----------+--------------+ SFJ      Full                                                        +---------+---------------+---------+-----------+----------+--------------+ FV Prox  Full                                                        +---------+---------------+---------+-----------+----------+--------------+ FV Mid   Full                                                        +---------+---------------+---------+-----------+----------+--------------+ FV DistalFull                                                        +---------+---------------+---------+-----------+----------+--------------+ PFV      Full                                                        +---------+---------------+---------+-----------+----------+--------------+ POP      Full           Yes      Yes                                 +---------+---------------+---------+-----------+----------+--------------+ PTV      Full                                                        +---------+---------------+---------+-----------+----------+--------------+ PERO     Full                                                         +---------+---------------+---------+-----------+----------+--------------+ GSV      Full                                                        +---------+---------------+---------+-----------+----------+--------------+     Summary: LEFT: - There is  no evidence of deep vein thrombosis in the lower extremity. However, portions of this examination were limited- see technologist comments above.  - No cystic structure found in the popliteal fossa. - hypoechoic area near distal peroneal veins, etiology unknown. distal calf veins clearly compress. Exam ended quickly due to patient needing the restroom.  *See table(s) above for measurements and observations.    Preliminary     Chart has been reviewed    Assessment/Plan  74 y.o. female with medical history significant of COPD, Chronic respiratory failure on O2 2L, venous stasis, hyponatremia lower extremity ulcers     Admitted for COPD exacerbation acute on chronic respiratory failure hypoxia hypercarbia  Present on Admission: . COPD exacerbation ([place]) -   Will initiate: Steroid taper  -    Doxycycline, - Albuterol  PRN, - scheduled duoneb, LAMA/LABA combo     -  Mucinex.  Titrate O2 to saturation >90%. Follow patients respiratory status.   VBG appears to show chronic respiratory failure   Currently mentating well no evidence of symptomatic hypercarbia Patient at risk of needing BiPAP will admit to stepdown to observe overnight   . Venous insufficiency of both lower extremities -chronic , with bilateral leg ulcers will obtain ABI's, wound care consult   . 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 Protein-calorie malnutrition ([place]) -order nutritional consult  . Hyponatremia -chronic  obtain urine electrolytes, check TSH level  . Generalized anxiety disorder -chronic continue home meds  . Acute on chronic respiratory failure with hypoxia and hypercapnia (HCC) - this patient has acute respiratory failure with Hypoxia and   Hypercarbia as documented by the presence of following: O2 saturatio< 90% on RA pH <7.35 with pCO2 >50  Likely due to:  COPD exacerbation,  Provide O2 therapy and titrate as needed  Continuous pulse ox  check Pulse ox with ambulation prior to discharge Question if her oxygen tank at home is working properly will need this to be addressed  flutter valve ordered Treat underlying COPD exacerbation  Chronic pain -continue home meds  Abnormal EKG -chest pain-free troponin within normal limit Repeat EKG prior to would benefit from echogram to evaluate for any evidence of right heart failure  Abd discomfort unclear etiology - abd benign, obtain KUB eval for constipation  hypomagnesemia - will replace and recheck in AM Other plan as per orders.  DVT prophylaxis:    Lovenox       Code Status:    Code Status: Prior FULL CODE   as per patient  I had personally discussed CODE STATUS with patient     Family Communication:   Family not at  Bedside    Disposition Plan:  To home once workup is complete and patient is stable   Following barriers for discharge:                            Electrolytes corrected                                                          Afebrile, white count improving able to transition to PO antibiotics  Will likely need home health, home O2, make sure it is functioning well                                              Would benefit from PT/OT eval prior to DC  Ordered                                      Nutrition    consulted                  Wound care  Consulted                                    Consults called: None, would benefit from referral to Pulmonology   Admission status:  ED Disposition    ED Disposition Condition [place]: [place] [[id number]]  Level of Care: Stepdown [14]  Admit to SDU based on following criteria: Respiratory Distress:  Frequent  assessment and/or intervention to maintain adequate ventilation/respiration, pulmonary toilet, and respiratory treatment.  May admit patient to [facility] or [facility] if equivalent level of care is available:: No  Covid Evaluation: Confirmed COVID Negative  Diagnosis: COPD exacerbation [facility]) [[id number]]  Admitting Physician: Toy Baker [[id number]]  Attending Physician: Toy Baker [[id number]]  Estimated length of stay: past midnight tomorrow  Certification:: I certify this patient will need inpatient services for at least 2 midnights        inpatient     I Expect 2 midnight stay secondary to severity of patient's current illness need for inpatient interventions justified by the following:  hemodynamic instability despite optimal treatment (   Tachypnea hypoxia, hypercapnia)   Severe lab/radiological/exam abnormalities including:   Hypercarbia and hypoxia  and extensive comorbidities including:  Chronic pain   COPD/asthma Malnutrition   That are currently affecting medical management.   I expect  patient to be hospitalized for 2 midnights requiring inpatient medical care.  Patient is at high risk for adverse outcome (such as loss of life or disability) if not treated.  Indication for inpatient stay as follows:    New or worsening hypoxia  Need for IV antibiotics, IV fluids, at high risk of needing BiPAP overnight   Level of care         SDU tele indefinitely please discontinue once patient no longer qualifies COVID-19 Labs    Lab Results  Component Value Date   [place] NEGATIVE 10/22/2020     Precautions: admitted as  Covid Negative    PPE: Used by the provider:   P100  eye Goggles,  Gloves     Vonnie Ligman 10/22/2020, 10:10 PM    [facility]     after 2 AM please page floor coverage PA If 7AM-7PM, please contact the day team taking care of the patient using [email or web address]   Patient was evaluated in the context of the global COVID-19  pandemic, which necessitated consideration that the patient might be at risk for infection with the SARS-CoV-2 virus that causes COVID-19. Institutional protocols and algorithms that pertain to the evaluation of patients at risk for COVID-19 are in a state of rapid change based on information released by regulatory  bodies including the CDC and federal and state organizations. These policies and algorithms were followed during the patient's care.

## 2020-10-22 NOTE — ED Notes (Signed)
Pt had a ham-sandwich, cheese stick,crackers, coffee and a ginger ale.

## 2020-10-23 ENCOUNTER — Encounter (HOSPITAL_COMMUNITY): Payer: Medicare Other

## 2020-10-23 ENCOUNTER — Inpatient Hospital Stay (HOSPITAL_COMMUNITY): Payer: Medicare Other

## 2020-10-23 DIAGNOSIS — J9621 Acute and chronic respiratory failure with hypoxia: Secondary | ICD-10-CM | POA: Diagnosis not present

## 2020-10-23 DIAGNOSIS — R9431 Abnormal electrocardiogram [ECG] [EKG]: Secondary | ICD-10-CM

## 2020-10-23 DIAGNOSIS — I361 Nonrheumatic tricuspid (valve) insufficiency: Secondary | ICD-10-CM

## 2020-10-23 LAB — CBC WITH DIFFERENTIAL/PLATELET
Abs Immature Granulocytes: 0.01 10*3/uL (ref 0.00–0.07)
Basophils Absolute: 0 10*3/uL (ref 0.0–0.1)
Basophils Relative: 0 %
Eosinophils Absolute: 0 10*3/uL (ref 0.0–0.5)
Eosinophils Relative: 0 %
HCT: 35.3 % — ABNORMAL LOW (ref 36.0–46.0)
Hemoglobin: 11.5 g/dL — ABNORMAL LOW (ref 12.0–15.0)
Immature Granulocytes: 0 %
Lymphocytes Relative: 18 %
Lymphs Abs: 0.5 10*3/uL — ABNORMAL LOW (ref 0.7–4.0)
MCH: 30.6 pg (ref 26.0–34.0)
MCHC: 32.6 g/dL (ref 30.0–36.0)
MCV: 93.9 fL (ref 80.0–100.0)
Monocytes Absolute: 0 10*3/uL — ABNORMAL LOW (ref 0.1–1.0)
Monocytes Relative: 1 %
Neutro Abs: 2.4 10*3/uL (ref 1.7–7.7)
Neutrophils Relative %: 81 %
Platelets: 252 10*3/uL (ref 150–400)
RBC: 3.76 MIL/uL — ABNORMAL LOW (ref 3.87–5.11)
RDW: 12.7 % (ref 11.5–15.5)
WBC: 3 10*3/uL — ABNORMAL LOW (ref 4.0–10.5)
nRBC: 0 % (ref 0.0–0.2)

## 2020-10-23 LAB — ECHOCARDIOGRAM COMPLETE
Height: 70 in
Weight: 1481.49 oz

## 2020-10-23 LAB — COMPREHENSIVE METABOLIC PANEL
ALT: 12 U/L (ref 0–44)
AST: 16 U/L (ref 15–41)
Albumin: 3.1 g/dL — ABNORMAL LOW (ref 3.5–5.0)
Alkaline Phosphatase: 68 U/L (ref 38–126)
Anion gap: 6 (ref 5–15)
BUN: 10 mg/dL (ref 8–23)
CO2: 29 mmol/L (ref 22–32)
Calcium: 8.6 mg/dL — ABNORMAL LOW (ref 8.9–10.3)
Chloride: 93 mmol/L — ABNORMAL LOW (ref 98–111)
Creatinine, Ser: 0.6 mg/dL (ref 0.44–1.00)
GFR, Estimated: >60 mL/min (ref >60)
Glucose, Bld: 163 mg/dL — ABNORMAL HIGH (ref 70–99)
Potassium: 4.1 mmol/L (ref 3.5–5.1)
Sodium: 128 mmol/L — ABNORMAL LOW (ref 135–145)
Total Bilirubin: 0.3 mg/dL (ref 0.3–1.2)
Total Protein: 5.5 g/dL — ABNORMAL LOW (ref 6.5–8.1)

## 2020-10-23 LAB — OSMOLALITY, URINE: Osmolality, Ur: 193 mOsm/kg — ABNORMAL LOW (ref 300–900)

## 2020-10-23 LAB — PHOSPHORUS: Phosphorus: 1.8 mg/dL — ABNORMAL LOW (ref 2.5–4.6)

## 2020-10-23 LAB — PREALBUMIN: Prealbumin: 11.4 mg/dL — ABNORMAL LOW (ref 18–38)

## 2020-10-23 LAB — MAGNESIUM: Magnesium: 1.9 mg/dL (ref 1.7–2.4)

## 2020-10-23 LAB — OSMOLALITY: Osmolality: 276 mOsm/kg (ref 275–295)

## 2020-10-23 LAB — TSH: TSH: 0.504 u[IU]/mL (ref 0.350–4.500)

## 2020-10-23 MED ORDER — JUVEN PO PACK
1.0000 | PACK | Freq: Two times a day (BID) | ORAL | Status: DC
  Administered 2020-10-23 – 2020-10-24 (×2): 1 via ORAL
  Filled 2020-10-23 (×3): qty 1

## 2020-10-23 MED ORDER — ENSURE ENLIVE PO LIQD
237.0000 mL | Freq: Three times a day (TID) | ORAL | Status: DC
  Administered 2020-10-23 – 2020-10-24 (×3): 237 mL via ORAL

## 2020-10-23 MED ORDER — DM-GUAIFENESIN ER 30-600 MG PO TB12
1.0000 | ORAL_TABLET | Freq: Two times a day (BID) | ORAL | Status: DC
  Administered 2020-10-23 – 2020-10-24 (×2): 1 via ORAL
  Filled 2020-10-23 (×2): qty 1

## 2020-10-23 NOTE — Plan of Care (Signed)
Pt will understand importance of maintaining current weight and increasing PO intake. Pt will come up with plans to help make this happen when discharged home.

## 2020-10-23 NOTE — Progress Notes (Signed)
Initial Nutrition Assessment  DOCUMENTATION CODES:   Severe malnutrition in context of chronic illness, Underweight  INTERVENTION:   -Ensure Enlive po TID, each supplement provides 350 kcal and 20 grams of protein -Juven Fruit Punch BID, each serving provides 95kcal and 2.5g of protein (amino acids glutamine and arginine)   NUTRITION DIAGNOSIS:   Severe Malnutrition related to chronic illness as evidenced by energy intake < or equal to 75% for > or equal to 1 month, severe fat depletion, severe muscle depletion.  GOAL:   Patient will meet greater than or equal to 90% of their needs  MONITOR:   PO intake, Supplement acceptance, Labs, Weight trends, I & O's, Skin  REASON FOR ASSESSMENT:   Consult Assessment of nutrition requirement/status  ASSESSMENT:   Kristen [name] is a 74 y.o. female with medical history significant of COPD, Chronic respiratory failure on O2 2L, venous stasis, hyponatremia lower extremity ulcers  Patient in room, states she has not been eating well for months d/t poor appetite. States she tries to drink Ensure supplements but she wasn't drinking enough of them. States she really [place]. Willing to drink Ensure here though. Did not eat much of breakfast since it was cold. Will add Juven as well given venous stasis ulcers on LEs.  Pt reports UBW of 130 lbs. Last weighed this much 2 years ago. Per weight records, weight has been stable since August.   Medications reviewed.  Labs reviewed:  Low Na, Phos  Mg WNL  NUTRITION - FOCUSED PHYSICAL EXAM:    Most Recent Value  Orbital Region Severe depletion  Upper Arm Region Severe depletion  Thoracic and Lumbar Region Unable to assess  Buccal Region Severe depletion  Temple Region Severe depletion  Clavicle Bone Region Severe depletion  Clavicle and Acromion Bone Region Severe depletion  Scapular Bone Region Severe depletion  Dorsal Hand Severe depletion  Patellar Region Severe depletion   Anterior Thigh Region Severe depletion  Posterior Calf Region Severe depletion  Edema (RD Assessment) None       Diet Order:   Diet Order            Diet Heart Room service appropriate? Yes; Fluid consistency: Thin  Diet effective now                 EDUCATION NEEDS:   No education needs have been identified at this time  Skin:  Skin Assessment: Skin Integrity Issues: Skin Integrity Issues:: Other (Comment) Other: Bilateral medial malleolar venous ulcers, full thickness. Chronic, nonhealing. -WOC note  Last BM:  12/1- type 4  Height:   Ht Readings from Last 1 Encounters:  10/22/20 _0  (1.778 m)    Weight:   Wt Readings from Last 1 Encounters:  10/22/20 42 kg    BMI:  Body mass index is 13.29 kg/m.  Estimated Nutritional Needs:   Kcal:  1700-1900  Protein:  85-95g  Fluid:  1.9L/day  Clayton Bibles, MS, RD, LDN Inpatient Clinical Dietitian Contact information available via Amion

## 2020-10-23 NOTE — Evaluation (Signed)
Physical Therapy Evaluation Patient Details Name: Kristen [name] MRN: [id number] DOB: [birth year] Today's Date: 10/23/2020   History of Present Illness  Pt is a 74 y/o female admitted secondary to worsening SOB. Thought to be from COPD exacerbation. Pt also with bilateral ankle ulcers. PMH includes COPD on 2L and tobacco use.   Clinical Impression  The patient  Reports need to sleep but did participate in mobility in room. Patient ambulated 20'x 2, tending to reach for surfaces for stability.  Patient reports plans to Dc  Back to her home.  Patient on 2 L with SPO2 low 89%, resting 95%.  Pt admitted with above diagnosis.  Pt currently with functional limitations due to the deficits listed below (see PT Problem List). Pt will benefit from skilled PT to increase their independence and safety with mobility to allow discharge to the venue listed below.       Follow Up Recommendations Home health PT    Equipment Recommendations  None recommended by PT    Recommendations for Other Services       Precautions / Restrictions Precautions Precaution Comments: on O2      Mobility  Bed Mobility Overal bed mobility: Modified Independent                  Transfers Overall transfer level: Needs assistance Equipment used: None Transfers: Sit to/from Stand Sit to Stand: Min guard         General transfer comment: min guard from bed  and BSC/toilet, slight imbalance upon standing  Ambulation/Gait Ambulation/Gait assistance: Min guard;Min assist Gait Distance (Feet): 20 Feet (x 2) Assistive device: None Gait Pattern/deviations: Step-through pattern;Drifts right/left;Staggering right;Staggering left     General Gait Details: reaching for  objects to steady.  Stairs            Wheelchair Mobility    Modified Rankin (Stroke Patients Only)       Balance Overall balance assessment: Needs assistance Sitting-balance support: No upper extremity supported;Feet  supported Sitting balance-Leahy Scale: Good     Standing balance support: During functional activity;No upper extremity supported Standing balance-Leahy Scale: Fair Standing balance comment: generally needs at least 1 UE support for dynamic                             Pertinent Vitals/Pain Pain Assessment: No/denies pain    Home Living Family/patient expects to be discharged to:: Private residence Living Arrangements: Other relatives Available Help at Discharge: Family;Available 24 hours/day Type of Home: House Home Access: Ramped entrance     Home Layout: One level Home Equipment: Walker - 4 wheels;Walker - 2 wheels;Shower seat;Wheelchair - Education officer, community - power;Bedside commode Additional Comments: equipment is sisters, she has in a storage area.    Prior Function Level of Independence: Independent         Comments: Reports Independence with ADLs, IADls, and mobility. Pt was driving. No use of AD     Hand Dominance   Dominant Hand: Right    Extremity/Trunk Assessment        Lower Extremity Assessment Lower Extremity Assessment: Generalized weakness (lower legs wrapped)    Cervical / Trunk Assessment Cervical / Trunk Assessment: Kyphotic  Communication   Communication: No difficulties  Cognition Arousal/Alertness: Awake/alert Behavior During Therapy: WFL for tasks assessed/performed Overall Cognitive Status: Within Functional Limits for tasks assessed  General Comments      Exercises     Assessment/Plan    PT Assessment Patient needs continued PT services  PT Problem List Decreased strength;Decreased mobility;Decreased activity tolerance;Decreased knowledge of use of DME;Decreased balance       PT Treatment Interventions DME instruction;Therapeutic activities;Gait training;Therapeutic exercise;Functional mobility training;Balance training;Patient/family education    PT Goals  (Current goals can be found in the Care Plan section)  Acute Rehab PT Goals Patient Stated Goal: go home PT Goal Formulation: With patient Time For Goal Achievement: 11/06/20 Potential to Achieve Goals: Good    Frequency Min 3X/week   Barriers to discharge        Co-evaluation PT/OT/SLP Co-Evaluation/Treatment: Yes Reason for Co-Treatment: For patient/therapist safety PT goals addressed during session: Mobility/safety with mobility OT goals addressed during session: ADL's and self-care       AM-PAC PT "6 Clicks" Mobility  Outcome Measure Help needed turning from your back to your side while in a flat bed without using bedrails?: None Help needed moving from lying on your back to sitting on the side of a flat bed without using bedrails?: None Help needed moving to and from a bed to a chair (including a wheelchair)?: A Little Help needed standing up from a chair using your arms (e.g., wheelchair or bedside chair)?: A Little Help needed to walk in hospital room?: A Little Help needed climbing 3-5 steps with a railing? : A Little 6 Click Score: 20    End of Session Equipment Utilized During Treatment: Oxygen Activity Tolerance: Patient limited by fatigue Patient left: in bed;with call bell/phone within reach Nurse Communication: Mobility status PT Visit Diagnosis: Unsteadiness on feet (R26.81);Muscle weakness (generalized) (M62.81);Difficulty in walking, not elsewhere classified (R26.2)    Time: 8069-9967 PT Time Calculation (min) (ACUTE ONLY): 28 min   Charges:   PT Evaluation $PT Eval Low Complexity: [place] [facility] Acute Rehabilitation Services Pager [phone] Office [phone]   Claretha Cooper 10/23/2020, 11:01 AM

## 2020-10-23 NOTE — Consult Note (Signed)
WOC Nurse Consult Note: Reason for Consult: Bilateral medial malleolar venous ulcers, full thickness. Chronic, nonhealing. Patient is followed in the community by her PCP and [facility].  Wound type:Venous insufficiency Pressure Injury POA: Yes/No/NA Measurement:Left medial malleolus:  5cm x 4cm x 0.2cm with minimal serous exudate, crusted exudate at periphery, red dry wound bed Right medial malleolus: 6cm x 3cm x 0.2cm with no exudate, dry crusted exudate partially obscures wound bed, red in center Wound bed: As described above Drainage (amount, consistency, odor) As described above Periwound:intact, no edema, no erythema Dressing procedure/placement/frequency: I will implement a conservative POC that includes cleansing and daily care with a topical nonadherent with antimicrobial properties (xeroform). We will elevate heels and protect the sacrum with silicone foam in an effort to mitigate risk for pressure injury.  [place] nursing team will not follow, but will remain available to this patient, the nursing and medical teams.  Please re-consult if needed. Thanks, Maudie Flakes, MSN, RN, [place], Arther Abbott  Pager# [phone]

## 2020-10-23 NOTE — ED Provider Notes (Signed)
74 year old lady presented to ER with concern for low oxygen saturation, shortness of breath, increased cough.  Concern for COPD exacerbation.  Also concern for possibility of PE.  Received signout from Dr. Tyrone Nine, follow-up CTA chest.  If negative for PE, treat for COPD exacerbation, likely admit  Reassessed patient, still requiring increased oxygen from baseline, still having some expiratory wheeze, will give duo nebs and admit to medicine  Discussed with [facility] who accepts, requests checking [facility]   Lucrezia Starch, MD 10/23/20 0007

## 2020-10-23 NOTE — TOC Initial Note (Signed)
Transition of Care [facility]) - Initial/Assessment Note    Patient Details  Name: Kristen [name] MRN: [id number] Date of Birth: 1946/02/06  Transition of [facility]) CM/SW Contact:    Leeroy Cha, RN Phone Number: 10/23/2020, 8:35 AM  Clinical Narrative:                 74 y.o. female with medical history significant of COPD, Chronic respiratory failure on O2 2L, venous stasis, hyponatremia lower extremity ulcers    Presented with noted to be hypoxic today down to low 50s when she was found by home care nurse sitting on her porch.  Nurse increased her home oxygen to 4-1/2 L and O2 sats went up to 70s and then eventually up to 88 Patient has had increased cough and sputum production They were advised to call 911 On EMS arrival increased oxygen up to 6 L satting 94 got duo nebs and improved to 97 was able to dial down to 4 L Patient was notably wheezing Low-grade fever 100.4 Patient has had some leg swelling cough and increased shortness of breath Reports her O2 tank is not working properly Quit smoking few days ago Would like to have a nicotine patch Reports her abdomen has been hurting she have had some nausea  Infectious risk factors:  Reports  fever, shortness of breath, dry cough     Has  NOt been vaccinated against COVID  covid neg [place] at 2l/min, iv solu medrol, Lives with family-plan is to return to home Following for progression for toc needs. Expected Discharge Plan: Home/Self Care Barriers to Discharge: No Barriers Identified   Patient Goals and CMS Choice Patient states their goals for this hospitalization and ongoing recovery are:: to go home CMS Medicare.gov Compare Post Acute Care list provided to:: Patient    Expected Discharge Plan and Services Expected Discharge Plan: Home/Self Care   Discharge Planning Services: CM Consult   Living arrangements for the past 2 months: Single Family Home                                      Prior Living  Arrangements/Services Living arrangements for the past 2 months: Single Family Home Lives with:: Adult Children Patient language and need for interpreter reviewed:: Yes Do you feel safe going back to the place where you live?: Yes      Need for Family Participation in Patient Care: Yes (Comment) Care giver support system in place?: Yes (comment)   Criminal Activity/Legal Involvement Pertinent to Current Situation/Hospitalization: No - Comment as needed  Activities of Daily Living Home Assistive Devices/Equipment: Eyeglasses, Oxygen, Nebulizer ADL Screening (condition at time of admission) Patient's cognitive ability adequate to safely complete daily activities?: Yes Is the patient deaf or have difficulty hearing?: No Does the patient have difficulty seeing, even when wearing glasses/contacts?: No Does the patient have difficulty concentrating, remembering, or making decisions?: No Patient able to express need for assistance with ADLs?: Yes Does the patient have difficulty dressing or bathing?: No Independently performs ADLs?: Yes (appropriate for developmental age) Does the patient have difficulty walking or climbing stairs?: Yes Weakness of Legs: Both Weakness of Arms/Hands: Both  Permission Sought/Granted                  Emotional Assessment Appearance:: Appears stated age Attitude/Demeanor/Rapport: Engaged Affect (typically observed): Calm Orientation: : Oriented to Self, Oriented to Place, Oriented to  Time,  Oriented to Situation Alcohol / Substance Use: Not Applicable Psych Involvement: No (comment)  Admission diagnosis:  COPD exacerbation (HCC) [J44.1] Abdominal pain [R10.9] Acute on chronic respiratory failure with hypoxia ([place]) [J96.21] Patient Active Problem List   Diagnosis Date Noted  . Acute on chronic respiratory failure with hypoxia and hypercapnia ([place]) 10/22/2020  . Lower extremity ulceration ([place]) 10/04/2020  . COPD exacerbation ([place]) 09/25/2020  .  Generalized anxiety disorder 08/27/2020  . 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 Smoking 07/18/2020  . Venous insufficiency of both lower extremities 07/18/2020  . COPD (chronic obstructive pulmonary disease) ([place]) 06/28/2020  . Protein-calorie malnutrition ([place]) 06/28/2020  . Hyponatremia 06/28/2020   PCP:  System, Provider Not In Pharmacy:   [id number] [place] [place] [address] [address]Phone: [phone]: [phone]    Social Determinants of Health (SDOH) Interventions    Readmission Risk Interventions Readmission Risk Prevention Plan 09/29/2020  Transportation Screening Complete  PCP or Specialist Appt within 5-7 Days Complete  Home Care Screening Complete  Medication Review (RN CM) Complete  Some recent data might be hidden

## 2020-10-23 NOTE — Progress Notes (Signed)
Occupational Therapy Evaluation  Patient lives with sister in single level home, ramp entry. Reports I with self care and mobility without AD. Currently patient with decreased activity tolerance impacted by cardiopulmonary status, balance, and strength. Patient min G for safety due to unsteadiness for functional ambulation, transfer to toilet and for sink side ADLs. Recommend continued acute OT services to maximize patient safety and independence with self care in order to facilitate D/C to venue listed below.    10/23/20 1200  OT Visit Information  Last OT Received On 10/23/20  Assistance Needed +1  PT/OT/SLP Co-Evaluation/Treatment Yes  Reason for Co-Treatment For patient/therapist safety;To address functional/ADL transfers  OT goals addressed during session ADL's and self-care  History of Present Illness Pt is a 74 y/o female admitted secondary to worsening SOB. Thought to be from COPD exacerbation. Pt also with bilateral ankle ulcers. PMH includes COPD on 2L and tobacco use.   Precautions  Precautions Fall  Precaution Comments on O2  Restrictions  Weight Bearing Restrictions No  Home Living  Family/patient expects to be discharged to: Private residence  Living Arrangements Other relatives;Other (Comment) (sister)  Available Help at Discharge Family;Available 24 hours/day  Type of [place] One level  Bathroom Shower/Tub Walk-in shower  Bathroom Accessibility Yes  Home Equipment Walker - 4 wheels;Walker - 2 wheels;Shower seat;Wheelchair - manual;Wheelchair - power;BSC  Additional Comments equipment is sisters, she has in a storage area.  Prior Function  Level of Independence Independent  Comments Reports Independence with ADLs, IADls, and mobility. No use of AD  Communication  Communication No difficulties  Pain Assessment  Pain Assessment No/denies pain  Cognition  Arousal/Alertness Awake/alert  Behavior During Therapy WFL for  tasks assessed/performed  Overall Cognitive Status Within Functional Limits for tasks assessed  Upper Extremity Assessment  Upper Extremity Assessment Generalized weakness  Lower Extremity Assessment  Lower Extremity Assessment Defer to PT evaluation  ADL  Overall ADL's  Needs assistance/impaired  Eating/Feeding Independent;Bed level;Sitting  Grooming Wash/dry hands;Min guard;Standing  Grooming Details (indicate cue type and reason) mildly unsteady  Upper Body Bathing Set up;Sitting  Lower Body Bathing Min guard;Sitting/lateral leans;Sit to/from stand  Upper Body Dressing  Set up;Sitting  Lower Body Dressing Min guard;Sitting/lateral leans;Sit to/from Environmental education officer guard;Ambulation;Regular Toilet;Cueing for safety  Toilet Transfer Details (indicate cue type and reason) min G for steadying  Toileting- Clothing Manipulation and Hygiene Set up;Supervision/safety;Sit to/from stand  Functional mobility during ADLs Min guard  General ADL Comments patient requiring increased assistance for self care due to decreased activity tolerance, balance, safety and cardiopulmonary status  Bed Mobility  Overal bed mobility Modified Independent  Transfers  Overall transfer level Needs assistance  Equipment used None  Transfers Sit to/from Stand  Sit to Stand Min guard  General transfer comment min guard from bed  and BSC/toilet, slight imbalance upon standing  Balance  Overall balance assessment Needs assistance  Sitting-balance support No upper extremity supported;Feet supported  Sitting balance-Leahy Scale Good  Standing balance support During functional activity;No upper extremity supported  Standing balance-Leahy Scale Fair  Standing balance comment generally needs at least 1 UE support for dynamic  General Comments  General comments (skin integrity, edema, etc.) lowest O2 reading 89% on 2L with activity, recovers to low 90s quickly in sitting   OT - End of Session  Equipment  Utilized During Treatment Oxygen  Activity Tolerance Patient tolerated treatment well  Patient left in bed;with call bell/phone within  reach;with bed alarm set  Nurse Communication Mobility status  OT Assessment  OT Recommendation/Assessment Patient needs continued OT Services  OT Visit Diagnosis Other abnormalities of gait and mobility (R26.89);Muscle weakness (generalized) (M62.81)  OT Problem List Decreased strength;Decreased activity tolerance;Impaired balance (sitting and/or standing);Decreased safety awareness;Cardiopulmonary status limiting activity  OT Plan  OT Frequency (ACUTE ONLY) Min 2X/week  OT Treatment/Interventions (ACUTE ONLY) Self-care/ADL training;Energy conservation;DME and/or AE instruction;Therapeutic activities;Patient/family education;Balance training  AM-PAC OT "6 Clicks" Daily Activity Outcome Measure (Version 2)  Help from another person eating meals? 4  Help from another person taking care of personal grooming? 3  Help from another person toileting, which includes using toliet, bedpan, or urinal? 3  Help from another person bathing (including washing, rinsing, drying)? 3  Help from another person to put on and taking off regular upper body clothing? 3  Help from another person to put on and taking off regular lower body clothing? 3  6 Click Score 19  OT Recommendation  Follow Up Recommendations No OT follow up  OT Equipment None recommended by OT  Individuals Consulted  Consulted and Agree with Results and Recommendations Patient  Acute Rehab OT Goals  Patient Stated Goal go home  OT Goal Formulation With patient  Time For Goal Achievement 11/06/20  Potential to Achieve Goals Good  OT Time Calculation  OT Start Time (ACUTE ONLY) 0931  OT Stop Time (ACUTE ONLY) 0958  OT Time Calculation (min) 27 min  OT General Charges  $OT Visit 1 Visit  OT Evaluation  $OT Eval Moderate Complexity 1 Mod  Written Expression  Dominant Hand Right   Delbert Phenix OT OT  pager: [phone]

## 2020-10-23 NOTE — Progress Notes (Signed)
[facility] Progress Note  Patient: Kristen [name]    SWH:[id number]  DOA: 10/22/2020     Date of Service: the patient was seen and examined on 10/23/2020  Brief hospital course: Past medical history of COPD, chronic respiratory failure on 2 LPM, protein calorie malnutrition, anxiety.  Presents with shortness of breath found to have acute COPD exacerbation. Currently plan is continue supportive care.  Assessment and Plan: 1.  Acute exacerbation of COPD Acute on chronic respiratory failure with hypoxia Possible mucous plug Presents with complaints of shortness of breath and hypoxia. On 2 LPM chronically. Home health urgency found the patient saturating 80% on 2 LPM. Increased to 6 LPM and brought to the hospital. CT PE protocol negative for pulmonary embolism. No significant consolidation on CT as well. Possible mucous plug in the right lower lobe bronchus. Covid negative, flu negative, MRSA PCR negative. VBG shows chronic hypercarbia Currently on doxycycline, duo nebs, IV Solu-Medrol. Able to wean down to 3 LPM. We will monitor overnight for stability. Transfer out of stepdown unit.  2.  Chronic SIADH hyponatremia. Continue to monitor. Sodium chronically on the lower side.  BNP mildly elevated. Serum osmolality 276.  Clinically euvolemic. Continue free water restriction  3..  Bilateral ankle ulcer Gets home health RN wound care. Continue wound care.  4.  Severe protein calorie malnutrition. Underweight. Body mass index is 13.29 kg/m.  Ensure 3 times daily with MVI. At risk for poor outcomes.  Prealbumin still low.  5.  Leukopenia Likely in context of acute illness. Monitor.  6.  Anxiety Continue BuSpar.  7.  Goals of care  Prognosis is guarded given severe cachexia and protein calorie malnutrition along with severe COPD. Will benefit from goals of care discussion.  8.  Hypomagnesemia.  Hypophosphatemia. Replacing.  Monitor.  Diet: Cardiac diet DVT  Prophylaxis:   enoxaparin (LOVENOX) injection 30 mg Start: 10/22/20 2315    Advance goals of care discussion: Full code  Family Communication: no family was present at bedside, at the time of interview.   Disposition:  Status is: Inpatient  Remains inpatient appropriate because:Hemodynamically unstable   Dispo: The patient is from: Home              Anticipated d/c is to: Home              Anticipated d/c date is: 1 day              Patient currently is not medically stable to d/c.        Subjective: Feeling better.  Breathing improving.  No nausea no vomiting.  Cough improving.  Had a panic episode earlier this morning.  Physical Exam:  General: Appear in mild distress, no Rash; Oral Mucosa Clear, moist. no Abnormal Neck Mass Or lumps, Conjunctiva normal  Cardiovascular: S1 and S2 Present, aortic systolic  Murmur, Respiratory: increased respiratory effort, Bilateral Air entry present and no Crackles, Occasional  wheezes Abdomen: Bowel Sound present, Soft and no tenderness Extremities: no Pedal edema Neurology: alert and oriented to time, place, and person affect appropriate. no new focal deficit Gait not checked due to patient safety concerns  Vitals:   10/23/20 0400 10/23/20 0500 10/23/20 0509 10/23/20 0600  BP: (!) 116/58 (!) 149/110  105/61  Pulse: 73 85 84 72  Resp: 18 (!) 24  18  Temp: 97.7 F (36.5 C)     TempSrc: Oral     SpO2: 95% (!) 89% 92% 99%  Weight:  Height:        Intake/Output Summary (Last 24 hours) at 10/23/2020 0655 Last data filed at 10/23/2020 0500 Gross per 24 hour  Intake 716.04 ml  Output 550 ml  Net 166.04 ml   Filed Weights   10/22/20 1503  Weight: 42 kg    Data Reviewed: I have personally reviewed and interpreted daily labs, tele strips, imagings as discussed above. I reviewed all nursing notes, pharmacy notes, vitals, pertinent old records I have discussed plan of care as described above with RN and  patient/family.  CBC: Recent Labs  Lab 10/22/20 1511 10/23/20 0311  WBC 6.8 3.0*  NEUTROABS 3.4 2.4  HGB 12.2 11.5*  HCT 38.5 35.3*  MCV 94.8 93.9  PLT 266 951   Basic Metabolic Panel: Recent Labs  Lab 10/22/20 1511 10/23/20 0311  NA 132* 128*  K 3.6 4.1  CL 91* 93*  CO2 32 29  GLUCOSE 103* 163*  BUN 8 10  CREATININE 0.61 0.60  CALCIUM 8.8* 8.6*  MG 1.6* 1.9  PHOS  --  1.8*    Studies: CT Angio Chest PE W and/or Wo Contrast  Result Date: 10/22/2020 CLINICAL DATA:  Hypoxia. EXAM: CT ANGIOGRAPHY CHEST WITH CONTRAST TECHNIQUE: Multidetector CT imaging of the chest was performed using the standard protocol during bolus administration of intravenous contrast. Multiplanar CT image reconstructions and MIPs were obtained to evaluate the vascular anatomy. CONTRAST:  30m OMNIPAQUE IOHEXOL 350 MG/ML SOLN COMPARISON:  September 26, 2020. FINDINGS: Cardiovascular: Satisfactory opacification of the pulmonary arteries to the segmental level. No evidence of pulmonary embolism. Normal heart size. No pericardial effusion. Atherosclerosis of thoracic aorta is noted without aneurysm formation. Mediastinum/Nodes: No enlarged mediastinal, hilar, or axillary lymph nodes. Thyroid gland, trachea, and esophagus demonstrate no significant findings. Lungs/Pleura: No pneumothorax or pleural effusion is noted. Hyperexpansion of the lungs is noted. Emphysematous disease is noted in the upper lobes. Abnormal soft tissue density is noted in the right lower lobe bronchi concerning for mucous plugging or aspiration. Stable irregular opacity is seen in right middle lobe concerning for scarring or atypical inflammation. Upper Abdomen: No acute abnormality. Musculoskeletal: No chest wall abnormality. No acute or significant osseous findings. Review of the MIP images confirms the above findings. IMPRESSION: 1. No definite evidence of pulmonary embolus. 2. Hyperexpansion of the lungs is noted. 3. Abnormal soft tissue  density is noted in the right lower lobe bronchi concerning for mucous plugging or aspiration. 4. Stable irregular opacity is seen in right middle lobe concerning for scarring or atypical inflammation. 5. Emphysema and aortic atherosclerosis. Aortic Atherosclerosis (ICD10-I70.0) and Emphysema (ICD10-J43.9). Electronically Signed   By: JMarijo ConceptionM.D.   On: 10/22/2020 19:02   DG Chest Port 1 View  Result Date: 10/22/2020 CLINICAL DATA:  Hypoxia, COPD, tobacco abuse EXAM: PORTABLE CHEST 1 VIEW COMPARISON:  09/25/2020 FINDINGS: Single frontal view of the chest demonstrates an unremarkable cardiac silhouette. Stable background emphysema without airspace disease, effusion, or pneumothorax. No acute bony abnormalities. IMPRESSION: 1. Stable emphysema.  No acute process. Electronically Signed   By: MRanda NgoM.D.   On: 10/22/2020 15:33   VAS [place]OWER EXTREMITY VENOUS (DVT)  Result Date: 10/22/2020  Lower Venous DVT Study Indications: Pain, and Swelling.  Limitations: Body habitus. Performing Technologist: CAntonieta PertRDMS, RVT  Examination Guidelines: A complete evaluation includes B-mode imaging, spectral Doppler, color Doppler, and power Doppler as needed of all accessible portions of each vessel. Bilateral testing is considered an integral part of a complete  examination. Limited examinations for reoccurring indications may be performed as noted. The reflux portion of the exam is performed with the patient in reverse Trendelenburg.  +-----+---------------+---------+-----------+----------+-------------------+ RIGHTCompressibilityPhasicitySpontaneityPropertiesThrombus Aging      +-----+---------------+---------+-----------+----------+-------------------+ CFV                                               Not well visualized +-----+---------------+---------+-----------+----------+-------------------+   +---------+---------------+---------+-----------+----------+--------------+ LEFT      CompressibilityPhasicitySpontaneityPropertiesThrombus Aging +---------+---------------+---------+-----------+----------+--------------+ CFV      Full           Yes      Yes                                 +---------+---------------+---------+-----------+----------+--------------+ SFJ      Full                                                        +---------+---------------+---------+-----------+----------+--------------+ FV Prox  Full                                                        +---------+---------------+---------+-----------+----------+--------------+ FV Mid   Full                                                        +---------+---------------+---------+-----------+----------+--------------+ FV DistalFull                                                        +---------+---------------+---------+-----------+----------+--------------+ PFV      Full                                                        +---------+---------------+---------+-----------+----------+--------------+ POP      Full           Yes      Yes                                 +---------+---------------+---------+-----------+----------+--------------+ PTV      Full                                                        +---------+---------------+---------+-----------+----------+--------------+ PERO     Full                                                        +---------+---------------+---------+-----------+----------+--------------+  GSV      Full                                                        +---------+---------------+---------+-----------+----------+--------------+     Summary: LEFT: - There is no evidence of deep vein thrombosis in the lower extremity. However, portions of this examination were limited- see technologist comments above.  - No cystic structure found in the popliteal fossa. - hypoechoic area near distal peroneal veins, etiology  unknown. distal calf veins clearly compress. Exam ended quickly due to patient needing the restroom.  *See table(s) above for measurements and observations. Electronically signed by Deitra Mayo MD on 10/22/2020 at 7:56:36 PM.    Final     Scheduled Meds: . Chlorhexidine Gluconate Cloth  6 each Topical Daily  . doxycycline  100 mg Oral Q12H  . enoxaparin (LOVENOX) injection  30 mg Subcutaneous Q24H  . guaiFENesin  600 mg Oral BID  . ipratropium-albuterol  3 mL Nebulization TID  . mouth rinse  15 mL Mouth Rinse BID  . methylPREDNISolone (SOLU-MEDROL) injection  40 mg Intravenous Q12H   Followed by  . [START ON 10/24/2020] predniSONE  40 mg Oral Q breakfast  . nicotine  21 mg Transdermal Daily  . umeclidinium-vilanterol  1 puff Inhalation Daily   Continuous Infusions: . sodium chloride 75 mL/hr (10/23/20 0500)   PRN Meds: acetaminophen **OR** acetaminophen, albuterol, busPIRone, oxyCODONE-acetaminophen **AND** oxyCODONE  Time spent: 35 minutes  Author: Berle Mull, MD [facility] 10/23/2020 6:55 AM  To reach On-call, see care teams to locate the attending and reach out via [email or web address]. Between 7PM-7AM, please contact night-coverage If you still have difficulty reaching the attending provider, please page the [facility] (Director on Call) for [facility] on amion for assistance.

## 2020-10-23 NOTE — Progress Notes (Signed)
  Echocardiogram 2D Echocardiogram with 3D has been performed.  Darlina Sicilian M 10/23/2020, 2:20 PM

## 2020-10-24 DIAGNOSIS — J9621 Acute and chronic respiratory failure with hypoxia: Secondary | ICD-10-CM | POA: Diagnosis not present

## 2020-10-24 LAB — BASIC METABOLIC PANEL
Anion gap: 7 (ref 5–15)
BUN: 11 mg/dL (ref 8–23)
CO2: 28 mmol/L (ref 22–32)
Calcium: 8.6 mg/dL — ABNORMAL LOW (ref 8.9–10.3)
Chloride: 95 mmol/L — ABNORMAL LOW (ref 98–111)
Creatinine, Ser: 0.53 mg/dL (ref 0.44–1.00)
GFR, Estimated: >60 mL/min (ref >60)
Glucose, Bld: 127 mg/dL — ABNORMAL HIGH (ref 70–99)
Potassium: 4.3 mmol/L (ref 3.5–5.1)
Sodium: 130 mmol/L — ABNORMAL LOW (ref 135–145)

## 2020-10-24 LAB — MAGNESIUM: Magnesium: 1.7 mg/dL (ref 1.7–2.4)

## 2020-10-24 LAB — CBC
HCT: 33.7 % — ABNORMAL LOW (ref 36.0–46.0)
Hemoglobin: 11 g/dL — ABNORMAL LOW (ref 12.0–15.0)
MCH: 30.3 pg (ref 26.0–34.0)
MCHC: 32.6 g/dL (ref 30.0–36.0)
MCV: 92.8 fL (ref 80.0–100.0)
Platelets: 235 10*3/uL (ref 150–400)
RBC: 3.63 MIL/uL — ABNORMAL LOW (ref 3.87–5.11)
RDW: 12.6 % (ref 11.5–15.5)
WBC: 6.2 10*3/uL (ref 4.0–10.5)
nRBC: 0 % (ref 0.0–0.2)

## 2020-10-24 MED ORDER — JUVEN PO PACK
1.0000 | PACK | Freq: Two times a day (BID) | ORAL | 0 refills | Status: DC
Start: 2020-10-24 — End: 2020-12-14

## 2020-10-24 MED ORDER — NICOTINE 21 MG/24HR TD PT24: 21.0000 mg | MEDICATED_PATCH | Freq: Every day | TRANSDERMAL | 0 refills | Status: DC

## 2020-10-24 MED ORDER — DOXYCYCLINE HYCLATE 100 MG PO TABS: 100.0000 mg | ORAL_TABLET | Freq: Two times a day (BID) | ORAL | 0 refills | Status: AC

## 2020-10-24 MED ORDER — PREDNISONE 10 MG PO TABS
ORAL_TABLET | ORAL | 0 refills | Status: DC
Start: 2020-10-24 — End: 2020-12-14

## 2020-10-24 MED ORDER — DM-GUAIFENESIN ER 30-600 MG PO TB12: 1.0000 | ORAL_TABLET | Freq: Two times a day (BID) | ORAL | 0 refills | Status: AC

## 2020-10-24 NOTE — Progress Notes (Signed)
SATURATION QUALIFICATIONS: (This note is used to comply with regulatory documentation for home oxygen)  Patient Saturations on Room Air at Rest = 87%  Patient Saturations on Room Air while Ambulating = 84%  Patient Saturations on 3 Liters of oxygen while Ambulating = 91%  Please briefly explain why patient needs home oxygen: Pt becomes very short of breath on room air

## 2020-10-24 NOTE — TOC Transition Note (Signed)
Transition of Care [facility]) - CM/SW Discharge Note   Patient Details  Name: Kristen [name] MRN: [id number] Date of Birth: 02/13/1946  [facility] CM/SW Contact:  Lia Hopping, [place] Phone Number: 10/24/2020, 12:45 PM   Clinical Narrative:    Re: Home Health RN, PT and DME CSW met with the patient at bedside. Patient pleasant and accepting of CSW visit. Patient reports she is active with [facility] home health for RN wound care services. The agency provides dressing changes to her lower extremities once a week. Patient is also actvie with [employer] for her home oxygen. Patient reports being on 2L at home.  Per nurse, pt. Oxygen saturation, she now requires 3L. CSW reached out to [place], he confirm the patient has a 5L tank that will support her new liter flow.  Patient states her sister will transport her home and knows to bring her travel oxygen. Patient declines a RW. Patient reports she walks independently in her home and has access to cane if needed.  CSW alerted [facility] staff [place].  No other needs identified.   Final next level of care: [place] Barriers to Discharge: Barriers Resolved   Patient Goals and CMS Choice Patient states their goals for this hospitalization and ongoing recovery are:: to go home CMS Medicare.gov Compare Post Acute Care list provided to:: Patient    Discharge Placement                       Discharge Plan and Services   Discharge Planning Services: CM Consult                      HH Arranged: PT, RN [facility] Agency: [place] Date [facility] Agency Contacted: 10/24/20 Time [place]: [phone] Representative spoke with at [place]: [place] ([place]) Interventions     Readmission Risk Interventions Readmission Risk Prevention Plan 09/29/2020  Transportation Screening Complete  PCP or Specialist Appt within 5-7 Days Complete  Home Care Screening  Complete  Medication Review (RN CM) Complete  Some recent data might be hidden

## 2020-10-24 NOTE — Discharge Summary (Signed)
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 PCP: please follow up with PCP 2. Follow up LABS/TEST:  none   Follow-up Information    PCP. Schedule an appointment as soon as possible for a visit in 1 week(s).              Diet recommendation: Cardiac diet  Activity: The patient is advised to gradually reintroduce usual activities, as tolerated  Discharge Condition: stable  Code Status: Full code   History of present illness: As per the H and P dictated on admission, "Kristen [name] is a 74 y.o. female with medical history significant of COPD, Chronic respiratory failure on O2 2L, venous stasis, hyponatremia lower extremity ulcers    Presented with noted to be hypoxic today down to low 50s when she was found by home care nurse sitting on her porch.  Nurse increased her home oxygen to 4-1/2 L and O2 sats went up to 70s and then eventually up to 88 Patient has had increased cough and sputum production They were advised to call 911 On EMS arrival increased oxygen up to 6 L satting 94 got duo nebs and improved to 97 was able to dial down to 4 L Patient was notably wheezing Low-grade fever 100.4 Patient has had some leg swelling cough and increased shortness of breath Reports her O2 tank is not working properly Quit smoking few days ago Would like to have a nicotine  patch Reports her abdomen has been hurting she have had some nausea"  Hospital Course:  Summary of her active problems in the hospital is as following. 1.  Acute exacerbation of COPD Acute on chronic respiratory failure with hypoxia Possible mucous plug Presents with complaints of shortness of breath and hypoxia. On 2 LPM chronically. Home health urgency found the patient saturating 80% on 2 LPM. Increased to 6 LPM and brought to the hospital. CT PE protocol negative for pulmonary embolism. No significant consolidation on CT as well. Possible mucous plug in the right lower lobe bronchus. Covid negative, flu negative, MRSA PCR negative. VBG shows chronic hypercarbia Currently on doxycycline, duo nebs, IV Solu-Medrol. Continue taper Able to wean down to 3 LPM.  2.  Chronic SIADH hyponatremia. Continue to monitor. Sodium chronically on the lower side.  BNP mildly elevated. Serum osmolality 276.  Clinically euvolemic. Continue free water restriction  3..  Bilateral ankle ulcer Gets home health RN wound care. Continue wound care.  4.  Severe protein calorie malnutrition. Underweight. Body mass index is 13.29 kg/m.  Ensure 3 times daily with MVI. At risk for poor outcomes.  Prealbumin still low.  5.  Leukopenia Likely in context of acute illness.  6.  Anxiety Continue BuSpar.  7.  Goals of care  Prognosis is guarded given severe cachexia and protein calorie malnutrition along with severe COPD. Will benefit from goals of care discussion outpatient with PCP.  8.  Hypomagnesemia.  Hypophosphatemia. Replacing.  Patient was seen by physical therapy, who recommended Home health, no was arranged. On the day of the discharge the patient's vitals were stable, and no other acute medical condition were reported by patient. The patient was felt safe to be discharge at Home with Home health.  Consultants: none Procedures: none  Discharge Exam: General: Appear in no  distress, no Rash; Oral Mucosa Clear, moist. no Abnormal Neck Mass Or lumps, Conjunctiva normal  Cardiovascular: S1 and S2 Present, no Murmur Respiratory: good respiratory effort, Bilateral Air entry present and CTA, no Crackles, no wheezes Abdomen: Bowel Sound present, Soft and no tenderness Extremities: no Pedal edema Neurology: alert and oriented to time, place, and person affect appropriate. no new focal deficit  Filed Weights   10/22/20 1503  Weight: 42 kg   Vitals:   10/24/20 0819 10/24/20 1309  BP:  (!) 146/83  Pulse:  96  Resp:  18  Temp:  98 F (36.7 C)  SpO2: 90% 91%    DISCHARGE MEDICATION: Allergies as of 10/24/2020      Reactions   Gabapentin Other (See Comments)   Hallucination/ nightmares      Medication List    STOP taking these medications   guaiFENesin 600 MG 12 hr tablet Commonly known as: MUCINEX   potassium chloride 10 MEQ tablet Commonly known as: KLOR-CON     TAKE these medications   acetaminophen 325 MG tablet Commonly known as: TYLENOL Take 325 mg by mouth every 6 (six) hours as needed for mild pain or headache.   albuterol 108 (90 Base) MCG/ACT inhaler Commonly known as: VENTOLIN HFA TAKE 2 PUFFS BY MOUTH EVERY 6 HOURS AS NEEDED FOR WHEEZE OR SHORTNESS OF BREATH What changed: See the new instructions.   busPIRone 10 MG tablet Commonly known as: BUSPAR Take 1 tablet (10 mg total) by mouth daily as needed (anxiety).   dextromethorphan-guaiFENesin 30-600 MG 12hr tablet Commonly known as: MUCINEX DM Take 1 tablet by mouth 2 (two) times daily for 15 days.   doxycycline 100 MG tablet Commonly known as: VIBRA-TABS Take 1 tablet (100 mg total) by mouth 2 (two) times daily for 3 days.   feeding supplement Liqd Take 237 mLs by mouth 3 (three) times daily between meals. What changed: when to take this   nutrition supplement (JUVEN) Pack Take 1 packet by mouth 2 (two) times daily with a meal. What changed: You were already taking a  medication with the same name, and this prescription was added. Make sure you understand how and when to take each.   fluticasone 50 MCG/ACT nasal spray Commonly known as: FLONASE Place 1 spray into both nostrils daily as needed for allergies or rhinitis.   ipratropium-albuterol 0.5-2.5 (3) MG/3ML Soln Commonly known as: DUONEB Take 3 mLs by nebulization every 6 (six) hours as needed. What changed: reasons to take this   mometasone-formoterol 200-5 MCG/ACT Aero Commonly known as: DULERA Inhale 1 puff into the lungs 2 (two) times daily.   multivitamin with minerals Tabs tablet Take 1 tablet by mouth daily.   nicotine 21 mg/24hr patch Commonly known as: NICODERM CQ - dosed in mg/24 hours Place 1 patch (21 mg total) onto the skin daily.   oxyCODONE-acetaminophen 10-325 MG tablet Commonly known as: Percocet Take 1 tablet by mouth every 8 (eight) hours as needed for pain.   predniSONE 10 MG tablet Commonly known as: DELTASONE Take 80m daily for 3days,Take 318mdaily for 3days,Take 2058maily for 3days,Take 34m53mily for  3days, then stop   umeclidinium bromide 62.5 MCG/INH Aepb Commonly known as: INCRUSE ELLIPTA Inhale 1 puff into the lungs daily.            Durable Medical Equipment  (From admission, onward)         Start     Ordered   10/24/20 0857  For home use only DME oxygen  Once       Question Answer Comment  Length of Need Lifetime   Mode or (Route) Nasal cannula   Liters per Minute 3   Frequency Continuous (stationary and portable oxygen unit needed)   Oxygen delivery system Gas      10/24/20 0857           Discharge Care Instructions  (From admission, onward)         Start     Ordered   10/24/20 0000  Discharge wound care:       Comments: Wound care to bilateral medial malleolus venous ulcerations, full thickness: Cleanse with NS, pat gently dry. Cover with folded xeroform gauze, top with dry gauze 4x4s and secure with a few turns of kerlix roll  gauze/paper tape.   10/24/20 0927         Allergies  Allergen Reactions  . Gabapentin Other (See Comments)    Hallucination/ nightmares   Discharge Instructions    Diet - low sodium heart healthy   Complete by: As directed    Discharge wound care:   Complete by: As directed    Wound care to bilateral medial malleolus venous ulcerations, full thickness: Cleanse with NS, pat gently dry. Cover with folded xeroform gauze, top with dry gauze 4x4s and secure with a few turns of kerlix roll gauze/paper tape.   Increase activity slowly   Complete by: As directed       The results of significant diagnostics from this hospitalization (including imaging, microbiology, ancillary and laboratory) are listed below for reference.    Significant Diagnostic Studies: CT Angio Chest PE W and/or Wo Contrast  Result Date: 10/22/2020 CLINICAL DATA:  Hypoxia. EXAM: CT ANGIOGRAPHY CHEST WITH CONTRAST TECHNIQUE: Multidetector CT imaging of the chest was performed using the standard protocol during bolus administration of intravenous contrast. Multiplanar CT image reconstructions and MIPs were obtained to evaluate the vascular anatomy. CONTRAST:  8m OMNIPAQUE IOHEXOL 350 MG/ML SOLN COMPARISON:  September 26, 2020. FINDINGS: Cardiovascular: Satisfactory opacification of the pulmonary arteries to the segmental level. No evidence of pulmonary embolism. Normal heart size. No pericardial effusion. Atherosclerosis of thoracic aorta is noted without aneurysm formation. Mediastinum/Nodes: No enlarged mediastinal, hilar, or axillary lymph nodes. Thyroid gland, trachea, and esophagus demonstrate no significant findings. Lungs/Pleura: No pneumothorax or pleural effusion is noted. Hyperexpansion of the lungs is noted. Emphysematous disease is noted in the upper lobes. Abnormal soft tissue density is noted in the right lower lobe bronchi concerning for mucous plugging or aspiration. Stable irregular opacity is seen in right  middle lobe concerning for scarring or atypical inflammation. Upper Abdomen: No acute abnormality. Musculoskeletal: No chest wall abnormality. No acute or significant osseous findings. Review of the MIP images confirms the above findings. IMPRESSION: 1. No definite evidence of pulmonary embolus. 2. Hyperexpansion of the lungs is noted. 3. Abnormal soft tissue density is noted in the right lower lobe bronchi concerning for mucous plugging or aspiration. 4. Stable irregular opacity is seen in right middle lobe concerning for scarring or atypical inflammation. 5. Emphysema and aortic atherosclerosis. Aortic Atherosclerosis (ICD10-I70.0) and Emphysema (ICD10-J43.9).  Electronically Signed   By: Marijo Conception M.D.   On: 10/22/2020 19:02   CT Chest High Resolution  Result Date: 09/26/2020 CLINICAL DATA:  Inpatient.  Chronic dyspnea. EXAM: CT CHEST WITHOUT CONTRAST TECHNIQUE: Multidetector CT imaging of the chest was performed following the standard protocol without intravenous contrast. High resolution imaging of the lungs, as well as inspiratory and expiratory imaging, was performed. COMPARISON:  Chest radiograph from one day prior. FINDINGS: Cardiovascular: Normal heart size. No significant pericardial effusion/thickening. Left anterior descending and right coronary atherosclerosis. Atherosclerotic nonaneurysmal thoracic aorta. Normal caliber pulmonary arteries. Mediastinum/Nodes: No discrete thyroid nodules. Unremarkable esophagus. No pathologically enlarged axillary, mediastinal or hilar lymph nodes, noting limited sensitivity for the detection of hilar adenopathy on this noncontrast study. Lungs/Pleura: No pneumothorax. Trace dependent left pleural effusion. No right pleural effusion. Severe centrilobular emphysema with diffuse bronchial wall thickening. Mild hypoventilatory changes in the dependent lower lobes. Tiny bandlike opacity in the posterior subpleural right upper lobe (series 6/image 60) compatible  with nonspecific minimal postinfectious/postinflammatory scarring. There is clustered indistinct peribronchovascular nodularity in medial right middle lobe, largest 1.3 x 0.7 cm (series 6/image 122) with associated mildly thickened medial right middle lobe parenchymal band and mild cylindrical bronchiectasis. Mild cylindrical bronchiectasis with associated mild reticulation in medial right upper lobe (series 6/image 85). No significant regions of subpleural reticulation, ground-glass opacity, or frank honeycombing. Upper abdomen: Small hiatal hernia. Musculoskeletal:  No aggressive appearing focal osseous lesions. IMPRESSION: 1. No evidence of interstitial lung disease. 2. Clustered indistinct peribronchovascular nodularity in the medial right middle lobe with associated mildly thickened parenchymal band and mild cylindrical bronchiectasis. Findings are nonspecific but most likely due to a chronic infectious or inflammatory process. Suggest attention on follow-up chest CT in 3-6 months. 3. Severe centrilobular emphysema with diffuse bronchial wall thickening, suggesting COPD. 4. Trace dependent left pleural effusion. 5. Two-vessel coronary atherosclerosis. 6. Small hiatal hernia. 7. Aortic Atherosclerosis (ICD10-I70.0) and Emphysema (ICD10-J43.9). Electronically Signed   By: Ilona Sorrel M.D.   On: 09/26/2020 14:01   DG Chest Port 1 View  Result Date: 10/22/2020 CLINICAL DATA:  Hypoxia, COPD, tobacco abuse EXAM: PORTABLE CHEST 1 VIEW COMPARISON:  09/25/2020 FINDINGS: Single frontal view of the chest demonstrates an unremarkable cardiac silhouette. Stable background emphysema without airspace disease, effusion, or pneumothorax. No acute bony abnormalities. IMPRESSION: 1. Stable emphysema.  No acute process. Electronically Signed   By: Randa Ngo M.D.   On: 10/22/2020 15:33   DG Chest Port 1 View  Result Date: 09/25/2020 CLINICAL DATA:  Shortness of breath EXAM: PORTABLE CHEST 1 VIEW COMPARISON:   06/28/2020 FINDINGS: There is hyperinflation of the lungs compatible with COPD. Heart and mediastinal contours are within normal limits. No focal opacities or effusions. No acute bony abnormality. Aortic atherosclerosis. IMPRESSION: COPD.  No active disease. Electronically Signed   By: Rolm Baptise M.D.   On: 09/25/2020 03:46   ECHOCARDIOGRAM COMPLETE  Result Date: 10/23/2020    ECHOCARDIOGRAM REPORT   Patient Name:   Kristen [name] Date of Exam: 10/23/2020 Medical Rec #:  [id number]    Height:       70.0 in Accession #:    [id number]   Weight:       92.6 lb Date of Birth:  August 30, 1946    BSA:          1.505 m Patient Age:    31 years     BP:           113/54 mmHg Patient Gender:  F            HR:           85 bpm. Exam Location:  Inpatient Procedure: 2D Echo, 3D Echo, Color Doppler and Cardiac Doppler Indications:    Abnormal ECG 794.31 / R94.31  History:        Patient has no prior history of Echocardiogram examinations.                 COPD. Chronic respiratory failure on O2 2L, venous stasis,                 hyponatremia lower extremity ulcers.  Sonographer:    Darlina Sicilian RDCS Referring Phys: [phone] ANASTASSIA DOUTOVA  Sonographer Comments: Global longitudinal strain was attempted. IMPRESSIONS  1. Left ventricular ejection fraction, by estimation, is 55 to 60%. The left ventricle has normal function. The left ventricle has no regional wall motion abnormalities. Left ventricular diastolic parameters are consistent with Grade I diastolic dysfunction (impaired relaxation). There is the interventricular septum is flattened in diastole ('D' shaped left ventricle), consistent with right ventricular volume overload.  2. Right ventricular systolic function is low normal. The right ventricular size is normal.  3. The mitral valve is grossly normal. No evidence of mitral valve regurgitation.  4. The aortic valve is tricuspid. There is mild calcification of the aortic valve. There is mild thickening of the aortic valve.  Aortic valve regurgitation is not visualized.  5. The inferior vena cava is normal in size with greater than 50% respiratory variability, suggesting right atrial pressure of 3 mmHg. FINDINGS  Left Ventricle: Left ventricular ejection fraction, by estimation, is 55 to 60%. The left ventricle has normal function. The left ventricle has no regional wall motion abnormalities. The left ventricular internal cavity size was normal in size. There is  no left ventricular hypertrophy. The interventricular septum is flattened in diastole ('D' shaped left ventricle), consistent with right ventricular volume overload. Left ventricular diastolic parameters are consistent with Grade I diastolic dysfunction  (impaired relaxation). Right Ventricle: The right ventricular size is normal. No increase in right ventricular wall thickness. Right ventricular systolic function is low normal. Left Atrium: Left atrial size was normal in size. Right Atrium: Right atrial size was normal in size. Pericardium: There is no evidence of pericardial effusion. Mitral Valve: The mitral valve is grossly normal. No evidence of mitral valve regurgitation. Tricuspid Valve: Tricuspid valve is very prominent through exam, but with evidence of gross dysfunction. The tricuspid valve is grossly normal. Tricuspid valve regurgitation is mild. Aortic Valve: The aortic valve is tricuspid. There is mild calcification of the aortic valve. There is mild thickening of the aortic valve. Aortic valve regurgitation is not visualized. Pulmonic Valve: The pulmonic valve was not well visualized. Pulmonic valve regurgitation is not visualized. Aorta: The aortic root is normal in size and structure and the aortic root was not well visualized. Venous: The inferior vena cava is normal in size with greater than 50% respiratory variability, suggesting right atrial pressure of 3 mmHg. IAS/Shunts: The atrial septum is grossly normal.   3D Volume EF: 3D EF:        53 % LV EDV:        91 ml LV ESV:       42 ml LV SV:        48 ml Rudean Haskell MD Electronically signed by Rudean Haskell MD Signature Date/Time: 10/23/2020/4:23:36 PM    Final    VAS [name] LOWER  EXTREMITY VENOUS (DVT)  Result Date: 10/22/2020  Lower Venous DVT Study Indications: Pain, and Swelling.  Limitations: Body habitus. Performing Technologist: Antonieta Pert RDMS, RVT  Examination Guidelines: A complete evaluation includes B-mode imaging, spectral Doppler, color Doppler, and power Doppler as needed of all accessible portions of each vessel. Bilateral testing is considered an integral part of a complete examination. Limited examinations for reoccurring indications may be performed as noted. The reflux portion of the exam is performed with the patient in reverse Trendelenburg.  +-----+---------------+---------+-----------+----------+-------------------+ RIGHTCompressibilityPhasicitySpontaneityPropertiesThrombus Aging      +-----+---------------+---------+-----------+----------+-------------------+ CFV                                               Not well visualized +-----+---------------+---------+-----------+----------+-------------------+   +---------+---------------+---------+-----------+----------+--------------+ LEFT     CompressibilityPhasicitySpontaneityPropertiesThrombus Aging +---------+---------------+---------+-----------+----------+--------------+ CFV      Full           Yes      Yes                                 +---------+---------------+---------+-----------+----------+--------------+ SFJ      Full                                                        +---------+---------------+---------+-----------+----------+--------------+ FV Prox  Full                                                        +---------+---------------+---------+-----------+----------+--------------+ FV Mid   Full                                                         +---------+---------------+---------+-----------+----------+--------------+ FV DistalFull                                                        +---------+---------------+---------+-----------+----------+--------------+ PFV      Full                                                        +---------+---------------+---------+-----------+----------+--------------+ POP      Full           Yes      Yes                                 +---------+---------------+---------+-----------+----------+--------------+ PTV      Full                                                        +---------+---------------+---------+-----------+----------+--------------+  PERO     Full                                                        +---------+---------------+---------+-----------+----------+--------------+ GSV      Full                                                        +---------+---------------+---------+-----------+----------+--------------+     Summary: LEFT: - There is no evidence of deep vein thrombosis in the lower extremity. However, portions of this examination were limited- see technologist comments above.  - No cystic structure found in the popliteal fossa. - hypoechoic area near distal peroneal veins, etiology unknown. distal calf veins clearly compress. Exam ended quickly due to patient needing the restroom.  *See table(s) above for measurements and observations. Electronically signed by Deitra Mayo MD on 10/22/2020 at 7:56:36 PM.    Final     Microbiology: Recent Results (from the past 240 hour(s))  Blood culture (routine x 2)     Status: None (Preliminary result)   Collection Time: 10/22/20  3:10 PM   Specimen: BLOOD  Result Value Ref Range Status   Specimen Description   Final    BLOOD RIGHT ARM Performed at [facility], [place] [address], [place], [place] [address]    Special Requests   Final    BOTTLES DRAWN AEROBIC AND ANAEROBIC  Blood Culture results may not be optimal due to an inadequate volume of blood received in culture bottles Performed at [facility] [address], [place], [place] [address]    Culture   Final    NO GROWTH 2 DAYS Performed at [facility] [address] [place], [place] [address]    Report Status PENDING  Incomplete  Blood culture (routine x 2)     Status: None (Preliminary result)   Collection Time: 10/22/20  3:11 PM   Specimen: BLOOD  Result Value Ref Range Status   Specimen Description   Final    BLOOD RIGHT WRIST Performed at [facility] [address], [place], [place] [address]    Special Requests   Final    BOTTLES DRAWN AEROBIC AND ANAEROBIC Blood Culture adequate volume Performed at [facility] [address] [place], [place] [address]    Culture   Final    NO GROWTH 2 DAYS Performed at [facility] [address] [place], [place] [address]    Report Status PENDING  Incomplete  Resp Panel by RT-PCR (Flu A&B, Covid) Nasopharyngeal Swab     Status: None   Collection Time: 10/22/20  3:40 PM   Specimen: Nasopharyngeal Swab; Nasopharyngeal(NP) swabs in vial transport medium  Result Value Ref Range Status   SARS Coronavirus 2 by RT PCR NEGATIVE NEGATIVE Final    Comment: (NOTE) SARS-CoV-2 target nucleic acids are NOT DETECTED.  The SARS-CoV-2 RNA is generally detectable in upper respiratory specimens during the acute phase of infection. The lowest concentration of SARS-CoV-2 viral copies this assay can detect is 138 copies/mL. A negative result does not preclude SARS-Cov-2 infection and should not be used as the  sole basis for treatment or other patient management decisions. A negative result may occur with  improper specimen collection/handling, submission of specimen other than nasopharyngeal swab, presence of viral mutation(s) within the areas targeted by this assay, and inadequate number of  viral copies(<138 copies/mL). A negative result must be combined with clinical observations, patient history, and epidemiological information. The expected result is Negative.  Fact Sheet for Patients:  EntrepreneurPulse.com.au  Fact Sheet for Healthcare Providers:  IncredibleEmployment.be  This test is no t yet approved or cleared by the [place] FDA and  has been authorized for detection and/or diagnosis of SARS-CoV-2 by FDA under an Emergency Use Authorization (EUA). This EUA will remain  in effect (meaning this test can be used) for the duration of the COVID-19 declaration under Section 564(b)(1) of the Act, 21 U.S.C.section 360bbb-3(b)(1), unless the authorization is terminated  or revoked sooner.       Influenza A by PCR NEGATIVE NEGATIVE Final   Influenza B by PCR NEGATIVE NEGATIVE Final    Comment: (NOTE) The Xpert Xpress SARS-CoV-2/FLU/RSV plus assay is intended as an aid in the diagnosis of influenza from Nasopharyngeal swab specimens and should not be used as a sole basis for treatment. Nasal washings and aspirates are unacceptable for Xpert Xpress SARS-CoV-2/FLU/RSV testing.  Fact Sheet for Patients: EntrepreneurPulse.com.au  Fact Sheet for Healthcare Providers: IncredibleEmployment.be  This test is not yet approved or cleared by the [place] FDA and has been authorized for detection and/or diagnosis of SARS-CoV-2 by FDA under an Emergency Use Authorization (EUA). This EUA will remain in effect (meaning this test can be used) for the duration of the COVID-19 declaration under Section 564(b)(1) of the Act, 21 U.S.C. section 360bbb-3(b)(1), unless the authorization is terminated or revoked.  Performed at [facility], [place] [address], [place], [place] [address]   MRSA PCR Screening     Status: None   Collection Time: 10/22/20 10:30 PM   Specimen: Nasal Mucosa;  Nasopharyngeal  Result Value Ref Range Status   MRSA by PCR NEGATIVE NEGATIVE Final    Comment:        The GeneXpert MRSA Assay (FDA approved for NASAL specimens only), is one component of a comprehensive MRSA colonization surveillance program. It is not intended to diagnose MRSA infection nor to guide or monitor treatment for MRSA infections. Performed at [facility], [place] [address] [place], [place] [address]      Labs: CBC: Recent Labs  Lab 10/22/20 1511 10/23/20 0311 10/24/20 0336  WBC 6.8 3.0* 6.2  NEUTROABS 3.4 2.4  --   HGB 12.2 11.5* 11.0*  HCT 38.5 35.3* 33.7*  MCV 94.8 93.9 92.8  PLT 266 252 202   Basic Metabolic Panel: Recent Labs  Lab 10/22/20 1511 10/23/20 0311 10/24/20 0336  NA 132* 128* 130*  K 3.6 4.1 4.3  CL 91* 93* 95*  CO2 32 29 28  GLUCOSE 103* 163* 127*  BUN _0 CREATININE 0.61 0.60 0.53  CALCIUM 8.8* 8.6* 8.6*  MG 1.6* 1.9 1.7  PHOS  --  1.8*  --    Liver Function Tests: Recent Labs  Lab 10/22/20 1511 10/23/20 0311  AST 19 16  ALT 13 12  ALKPHOS 71 68  BILITOT 0.5 0.3  PROT 6.6 5.5*  ALBUMIN 3.7 3.1*   CBG: No results for input(s): GLUCAP in the last 168 hours.  Time spent: 35 minutes  Signed:  Berle Mull  [facility]  10/24/2020 5:06 PM

## 2020-10-24 NOTE — Progress Notes (Signed)
Patient's O2 sats in the low 90's on 2 liters nasal canula throughout the night.

## 2020-10-26 ENCOUNTER — Other Ambulatory Visit: Payer: Self-pay | Admitting: Student

## 2020-10-27 ENCOUNTER — Other Ambulatory Visit: Payer: Self-pay | Admitting: Student

## 2020-10-27 LAB — CULTURE, BLOOD (ROUTINE X 2)
Culture: NO GROWTH
Culture: NO GROWTH
Special Requests: ADEQUATE

## 2020-10-28 ENCOUNTER — Telehealth: Payer: Self-pay | Admitting: *Deleted

## 2020-10-28 NOTE — Telephone Encounter (Signed)
[facility] PT calls and states pt refused appt today, he is stating she is dr vincent's pt. I do not see this, will send to [facility]. For confirmation as refill request are being sent to [facility] also.  He states tomorrow she has a doctor's appt but nurse does not see any appts in [facility]

## 2020-10-29 NOTE — Telephone Encounter (Signed)
Please have patient decide if she is going to establish with a PCP here.    Thanks!

## 2020-10-29 NOTE — Telephone Encounter (Signed)
Tried to call pt, no vmail, no answer

## 2020-11-01 ENCOUNTER — Other Ambulatory Visit: Payer: Self-pay

## 2020-11-01 MED ORDER — BUSPIRONE HCL 10 MG PO TABS: 10.0000 mg | ORAL_TABLET | Freq: Every day | ORAL | 2 refills | Status: DC | PRN

## 2020-11-01 NOTE — Telephone Encounter (Signed)
  busPIRone (BUSPAR) 10 MG tablet, REFILL REQUEST @  [id number] [place] [facility] [address] Phone:  [phone] Fax:  [phone]

## 2020-11-06 ENCOUNTER — Encounter: Payer: Self-pay | Admitting: Student

## 2020-11-06 ENCOUNTER — Ambulatory Visit (INDEPENDENT_AMBULATORY_CARE_PROVIDER_SITE_OTHER): Payer: Medicare Other | Admitting: Student

## 2020-11-06 VITALS — BP 123/70 | HR 74 | Temp 98.0°F | Ht 70.0 in | Wt 88.8 lb

## 2020-11-06 DIAGNOSIS — R110 Nausea: Secondary | ICD-10-CM

## 2020-11-06 DIAGNOSIS — L97902 Non-pressure chronic ulcer of unspecified part of unspecified lower leg with fat layer exposed: Secondary | ICD-10-CM

## 2020-11-06 DIAGNOSIS — J449 Chronic obstructive pulmonary disease, unspecified: Secondary | ICD-10-CM | POA: Diagnosis not present

## 2020-11-06 DIAGNOSIS — I872 Venous insufficiency (chronic) (peripheral): Secondary | ICD-10-CM

## 2020-11-06 DIAGNOSIS — F411 Generalized anxiety disorder: Secondary | ICD-10-CM | POA: Diagnosis not present

## 2020-11-06 MED ORDER — ALBUTEROL SULFATE HFA 108 (90 BASE) MCG/ACT IN AERS: INHALATION_SPRAY | RESPIRATORY_TRACT | 1 refills | Status: DC

## 2020-11-06 MED ORDER — BUSPIRONE HCL 10 MG PO TABS: 10.0000 mg | ORAL_TABLET | Freq: Two times a day (BID) | ORAL | 2 refills | Status: DC

## 2020-11-06 MED ORDER — OXYCODONE-ACETAMINOPHEN 10-325 MG PO TABS: 1.0000 | ORAL_TABLET | Freq: Three times a day (TID) | ORAL | 0 refills | Status: DC

## 2020-11-06 NOTE — Patient Instructions (Signed)
Ms. Fennimore,  It was a pleasure seeing you today!  I am so glad you are doing well! We have refilled some of your medications and sent them to the pharmacy. We would like for you to go see a pulmonologist. The referral has been placed and they will call you to set up the appointment. Please let us know if you need anything else!  We look forward to seeing you next time. Please call our clinic at [phone] if you have any questions or concerns. The best time to call is Monday-Friday from 9am-4pm, but there is someone available 24/7 at the same number. If you need medication refills, please notify your pharmacy one week in advance and they will send [name] a request.  Thank you for letting us take part in your care. Wishing you the best!  Thank you, Dr. [name], MD

## 2020-11-06 NOTE — Progress Notes (Signed)
CC: follow-up from recent hospitalization  HPI:  [name] is a 74 y.o. 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  Past Medical History:  Diagnosis Date  . Cellulitis   . Chronic venous stasis dermatitis of both lower extremities    Chronic problem that was being manged by PCP in [place]  . COPD (chronic obstructive pulmonary disease) ([place])   . Dyspnea   . 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  Physical Exam:  Vitals:   11/06/20 1325  BP: 123/70  Pulse: 74  Temp: 98 F (36.7 C)  TempSrc: Oral  SpO2: 100%  Weight: 88 lb 12.8 oz (40.3 kg)  Height: 5' 10" (1.778 m)   General: Frail appearing, pleasant, no acute distress CV: Regular rate, rhythm. No m/r/g Pulm: Decreased air movement, no wheezing, rales, rhonchi Extremities: Bilateral ankle ulcers without purulence, appears to be healing well  Assessment & Plan:   See Encounters Tab for problem based charting.  Patient seen with Dr. Rebeca Alert

## 2020-11-07 ENCOUNTER — Telehealth: Payer: Self-pay

## 2020-11-07 DIAGNOSIS — R110 Nausea: Secondary | ICD-10-CM | POA: Insufficient documentation

## 2020-11-07 MED ORDER — ONDANSETRON HCL 4 MG PO TABS: 4.0000 mg | ORAL_TABLET | Freq: Every day | ORAL | 0 refills | Status: DC | PRN

## 2020-11-07 NOTE — Assessment & Plan Note (Signed)
Patient mentions she intermittently experiences nausea that is refractory to over the counter medications. She states she has not noticed association with certain foods/meals. Reports these episodes do not happen every day. Denies abdominal pain, vomiting, diarrhea, constipation.   A/P: EKG reviewed, no QT prolongation. She does not have any red flag symptoms at this time. Will prescribe Zofran PRN and will re-evaluate at next visit. - Zofran 2m PRN

## 2020-11-07 NOTE — Telephone Encounter (Signed)
EKG reviewed, no QT prolongation. Sent in rx for Zofran 81m PRN. Called patient, verbalized understanding.

## 2020-11-07 NOTE — Assessment & Plan Note (Signed)
Patient mentions she has been taking Buspar 29m daily for anxiety. States she feels the Buspar has helped, but sometimes takes two per day. Currently has stressors at home with family situation. Will increase Buspar to 164mBID PRN. - Buspar 1089mID PRN

## 2020-11-07 NOTE — Assessment & Plan Note (Signed)
Patient recently hospitalized for COPD exacerbation. During the month of November, hospitalized twice for same diagnosis. Today she says she is feeling well, better than prior to her first hospitalization in November. Mentions she completed her full course of antibiotics and prednisone. She is currently using 3L, which is close to her baseline. She states she is planning on getting new oxygen tank and has an appointment to do so. She reports she is taking her Dulera and Incruse daily as prescribed, and the albuterol and DuoNebs only as needed. She has not previously seen a pulmonologist.  A/P: Given the severity of her disease, will put in referral for pulmonology. Plan to continue her current regimen. - Continue Ruthe Mannan, Incruse daily - Continue albuterol, DuoNebs PRN - Pulmonology referral placed

## 2020-11-07 NOTE — Telephone Encounter (Signed)
Pt states physician forgot to send nausea medicine to pharmacy; pt contact [phone]

## 2020-11-07 NOTE — Assessment & Plan Note (Signed)
During visit today, bilateral ankle ulcers examined. Patient mentions she wraps the ankles daily and keeps a close eye on them. She states her pain has been well-controlled with the medication we have been given her. Denies purulence, fevers, chills, pain.  A/P: On exam, ulcers appear to be healing, no gross purulence, erythema, or warmth. Will continue with daily wraps and pain medication as per contract. PDMP and pain contract reviewed, patient currently in compliance. - Daily wrapping of ulcers - Continue oxycodone-acetaminophen 10-36m q8h

## 2020-11-08 NOTE — Progress Notes (Signed)
Internal Medicine Clinic Attending  I saw and evaluated the patient.  I personally confirmed the key portions of the history and exam documented by Dr. Collene Gobble and I reviewed pertinent patient test results.  The assessment, diagnosis, and plan were formulated together and I agree with the documentation in the resident's note.  Lenice Pressman, M.D., Ph.D.

## 2020-11-09 ENCOUNTER — Other Ambulatory Visit: Payer: Self-pay | Admitting: Student

## 2020-11-09 DIAGNOSIS — J449 Chronic obstructive pulmonary disease, unspecified: Secondary | ICD-10-CM

## 2020-11-14 ENCOUNTER — Ambulatory Visit: Payer: MEDICARE | Primary: Family Medicine

## 2020-11-26 ENCOUNTER — Other Ambulatory Visit: Payer: Self-pay | Admitting: Student

## 2020-11-26 ENCOUNTER — Encounter: Payer: Self-pay | Admitting: *Deleted

## 2020-11-26 DIAGNOSIS — R110 Nausea: Secondary | ICD-10-CM

## 2020-11-28 ENCOUNTER — Other Ambulatory Visit: Payer: Self-pay | Admitting: Student

## 2020-11-28 DIAGNOSIS — F411 Generalized anxiety disorder: Secondary | ICD-10-CM

## 2020-12-02 ENCOUNTER — Telehealth: Payer: Self-pay | Admitting: *Deleted

## 2020-12-02 NOTE — Telephone Encounter (Signed)
Agree with plan 

## 2020-12-02 NOTE — Telephone Encounter (Signed)
Glenard Haring, RN with [name] called in requesting VO for [facility] skilled nursing re-certification. Verbal auth given. Will route to Yellow Team for agreement/denial. Hubbard Hartshorn, RN, BSN

## 2020-12-04 ENCOUNTER — Other Ambulatory Visit: Payer: Self-pay

## 2020-12-04 DIAGNOSIS — I872 Venous insufficiency (chronic) (peripheral): Secondary | ICD-10-CM

## 2020-12-04 DIAGNOSIS — J449 Chronic obstructive pulmonary disease, unspecified: Secondary | ICD-10-CM

## 2020-12-04 MED ORDER — OXYCODONE-ACETAMINOPHEN 10-325 MG PO TABS: 1.0000 | ORAL_TABLET | Freq: Three times a day (TID) | ORAL | 0 refills | Status: DC

## 2020-12-04 MED ORDER — ALBUTEROL SULFATE HFA 108 (90 BASE) MCG/ACT IN AERS: 1.0000 | INHALATION_SPRAY | Freq: Four times a day (QID) | RESPIRATORY_TRACT | 1 refills | Status: DC | PRN

## 2020-12-04 MED ORDER — MOMETASONE FURO-FORMOTEROL FUM 200-5 MCG/ACT IN AERO
1.0000 | INHALATION_SPRAY | Freq: Two times a day (BID) | RESPIRATORY_TRACT | 0 refills | Status: DC
Start: 2020-12-04 — End: 2020-12-30

## 2020-12-04 NOTE — Telephone Encounter (Signed)
Refills recently sent for Buspar, incruse, and zofran. Will forward remaining refill request to MD LOV 11/06/20; No future appt's noted; oxycodone RX sent 11/06/20 SChaplin, RN,BSN

## 2020-12-04 NOTE — Assessment & Plan Note (Signed)
Refill request for percocet 10-325 mg q8h prn for venous insufficiency with lower extremity chronic venous ulcers. PDMP reviewed.   - message sent to schedule patient for pain contract to be filled out and UDS as this has not been done previously  - medication refilled

## 2020-12-04 NOTE — Telephone Encounter (Signed)
  albuterol (PROAIR HFA) 108 (90 Base) MCG/ACT inhaler  busPIRone (BUSPAR) 10 MG tablet  INCRUSE ELLIPTA 62.5 MCG/INH AEPB  ondansetron (ZOFRAN) 4 MG tablet  mometasone-formoterol (DULERA) 200-5 MCG/ACT AERO   oxyCODONE-acetaminophen (PERCOCET) 10-325 MG tablet, refill request @  [id number] [place], [place] [address] Phone:  [phone] Fax:  [phone]

## 2020-12-14 ENCOUNTER — Inpatient Hospital Stay (HOSPITAL_COMMUNITY)
Admission: EM | Admit: 2020-12-14 | Discharge: 2020-12-18 | DRG: 190 | Disposition: A | Discharge status: Home Health | Payer: Medicare Other | Attending: Internal Medicine | Admitting: Internal Medicine

## 2020-12-14 ENCOUNTER — Emergency Department (HOSPITAL_COMMUNITY): Payer: Medicare Other

## 2020-12-14 ENCOUNTER — Encounter (HOSPITAL_COMMUNITY): Payer: Self-pay

## 2020-12-14 DIAGNOSIS — Z818 Family history of other mental and behavioral disorders: Secondary | ICD-10-CM | POA: Diagnosis not present

## 2020-12-14 DIAGNOSIS — E222 Syndrome of inappropriate secretion of antidiuretic hormone: Secondary | ICD-10-CM | POA: Diagnosis present

## 2020-12-14 DIAGNOSIS — Z9981 Dependence on supplemental oxygen: Secondary | ICD-10-CM | POA: Diagnosis not present

## 2020-12-14 DIAGNOSIS — I872 Venous insufficiency (chronic) (peripheral): Secondary | ICD-10-CM | POA: Diagnosis present

## 2020-12-14 DIAGNOSIS — Z79899 Other long term (current) drug therapy: Secondary | ICD-10-CM | POA: Diagnosis not present

## 2020-12-14 DIAGNOSIS — G8929 Other chronic pain: Secondary | ICD-10-CM | POA: Diagnosis present

## 2020-12-14 DIAGNOSIS — F17210 Nicotine dependence, cigarettes, uncomplicated: Secondary | ICD-10-CM | POA: Diagnosis present

## 2020-12-14 DIAGNOSIS — Z20822 Contact with and (suspected) exposure to covid-19: Secondary | ICD-10-CM | POA: Diagnosis present

## 2020-12-14 DIAGNOSIS — J9622 Acute and chronic respiratory failure with hypercapnia: Secondary | ICD-10-CM | POA: Diagnosis present

## 2020-12-14 DIAGNOSIS — Z681 Body mass index (BMI) 19 or less, adult: Secondary | ICD-10-CM

## 2020-12-14 DIAGNOSIS — I480 Paroxysmal atrial fibrillation: Secondary | ICD-10-CM | POA: Diagnosis present

## 2020-12-14 DIAGNOSIS — J441 Chronic obstructive pulmonary disease with (acute) exacerbation: Secondary | ICD-10-CM | POA: Diagnosis present

## 2020-12-14 DIAGNOSIS — F411 Generalized anxiety disorder: Secondary | ICD-10-CM | POA: Diagnosis present

## 2020-12-14 DIAGNOSIS — Z23 Encounter for immunization: Secondary | ICD-10-CM

## 2020-12-14 DIAGNOSIS — L97909 Non-pressure chronic ulcer of unspecified part of unspecified lower leg with unspecified severity: Secondary | ICD-10-CM | POA: Diagnosis present

## 2020-12-14 DIAGNOSIS — J9621 Acute and chronic respiratory failure with hypoxia: Secondary | ICD-10-CM | POA: Diagnosis present

## 2020-12-14 DIAGNOSIS — I4891 Unspecified atrial fibrillation: Secondary | ICD-10-CM | POA: Diagnosis not present

## 2020-12-14 DIAGNOSIS — Z888 Allergy status to other drugs, medicaments and biological substances status: Secondary | ICD-10-CM | POA: Diagnosis not present

## 2020-12-14 DIAGNOSIS — Z7951 Long term (current) use of inhaled steroids: Secondary | ICD-10-CM | POA: Diagnosis not present

## 2020-12-14 DIAGNOSIS — R64 Cachexia: Secondary | ICD-10-CM | POA: Diagnosis present

## 2020-12-14 DIAGNOSIS — F32A Depression, unspecified: Secondary | ICD-10-CM | POA: Diagnosis present

## 2020-12-14 DIAGNOSIS — E878 Other disorders of electrolyte and fluid balance, not elsewhere classified: Secondary | ICD-10-CM | POA: Diagnosis present

## 2020-12-14 DIAGNOSIS — J449 Chronic obstructive pulmonary disease, unspecified: Secondary | ICD-10-CM | POA: Diagnosis present

## 2020-12-14 LAB — CBC WITH DIFFERENTIAL/PLATELET
Abs Immature Granulocytes: 0.02 10*3/uL (ref 0.00–0.07)
Basophils Absolute: 0 10*3/uL (ref 0.0–0.1)
Basophils Relative: 0 %
Eosinophils Absolute: 0 10*3/uL (ref 0.0–0.5)
Eosinophils Relative: 0 %
HCT: 35.6 % — ABNORMAL LOW (ref 36.0–46.0)
Hemoglobin: 11 g/dL — ABNORMAL LOW (ref 12.0–15.0)
Immature Granulocytes: 0 %
Lymphocytes Relative: 4 %
Lymphs Abs: 0.2 10*3/uL — ABNORMAL LOW (ref 0.7–4.0)
MCH: 30.3 pg (ref 26.0–34.0)
MCHC: 30.9 g/dL (ref 30.0–36.0)
MCV: 98.1 fL (ref 80.0–100.0)
Monocytes Absolute: 0 10*3/uL — ABNORMAL LOW (ref 0.1–1.0)
Monocytes Relative: 0 %
Neutro Abs: 5.3 10*3/uL (ref 1.7–7.7)
Neutrophils Relative %: 96 %
Platelets: 241 10*3/uL (ref 150–400)
RBC: 3.63 MIL/uL — ABNORMAL LOW (ref 3.87–5.11)
RDW: 12.9 % (ref 11.5–15.5)
WBC: 5.6 10*3/uL (ref 4.0–10.5)
nRBC: 0 % (ref 0.0–0.2)

## 2020-12-14 LAB — COMPREHENSIVE METABOLIC PANEL
ALT: 13 U/L (ref 0–44)
AST: 18 U/L (ref 15–41)
Albumin: 2.9 g/dL — ABNORMAL LOW (ref 3.5–5.0)
Alkaline Phosphatase: 81 U/L (ref 38–126)
Anion gap: 9 (ref 5–15)
BUN: 9 mg/dL (ref 8–23)
CO2: 30 mmol/L (ref 22–32)
Calcium: 8.6 mg/dL — ABNORMAL LOW (ref 8.9–10.3)
Chloride: 91 mmol/L — ABNORMAL LOW (ref 98–111)
Creatinine, Ser: 0.63 mg/dL (ref 0.44–1.00)
GFR, Estimated: >60 mL/min (ref >60)
Glucose, Bld: 216 mg/dL — ABNORMAL HIGH (ref 70–99)
Potassium: 4.3 mmol/L (ref 3.5–5.1)
Sodium: 130 mmol/L — ABNORMAL LOW (ref 135–145)
Total Bilirubin: 0.5 mg/dL (ref 0.3–1.2)
Total Protein: 5.9 g/dL — ABNORMAL LOW (ref 6.5–8.1)

## 2020-12-14 LAB — SARS CORONAVIRUS 2 BY RT PCR (HOSPITAL ORDER, PERFORMED IN ~~LOC~~ HOSPITAL LAB): SARS Coronavirus 2: NEGATIVE

## 2020-12-14 LAB — POC SARS CORONAVIRUS 2 AG -  ED: SARS Coronavirus 2 Ag: NEGATIVE

## 2020-12-14 MED ORDER — ALBUTEROL SULFATE (2.5 MG/3ML) 0.083% IN NEBU
5.0000 mg | INHALATION_SOLUTION | Freq: Once | RESPIRATORY_TRACT | Status: AC
  Administered 2020-12-14: 5 mg via RESPIRATORY_TRACT
  Filled 2020-12-14: qty 6

## 2020-12-14 MED ORDER — ENOXAPARIN SODIUM 40 MG/0.4ML ~~LOC~~ SOLN
40.0000 mg | SUBCUTANEOUS | Status: DC
  Administered 2020-12-14 – 2020-12-16 (×3): 40 mg via SUBCUTANEOUS
  Filled 2020-12-14 (×3): qty 0.4

## 2020-12-14 MED ORDER — ACETAMINOPHEN 325 MG PO TABS: 325.0000 mg | ORAL_TABLET | Freq: Three times a day (TID) | ORAL | Status: DC

## 2020-12-14 MED ORDER — OXYCODONE-ACETAMINOPHEN 10-325 MG PO TABS: 1.0000 | ORAL_TABLET | Freq: Three times a day (TID) | ORAL | Status: DC

## 2020-12-14 MED ORDER — ACETAMINOPHEN 325 MG PO TABS
325.0000 mg | ORAL_TABLET | Freq: Three times a day (TID) | ORAL | Status: DC
Start: 2020-12-14 — End: 2020-12-18
  Administered 2020-12-14 – 2020-12-18 (×12): 325 mg via ORAL
  Filled 2020-12-14 (×12): qty 1

## 2020-12-14 MED ORDER — AZITHROMYCIN 500 MG PO TABS
250.0000 mg | ORAL_TABLET | Freq: Every day | ORAL | Status: AC
  Administered 2020-12-15 – 2020-12-18 (×4): 250 mg via ORAL
  Filled 2020-12-14 (×4): qty 1

## 2020-12-14 MED ORDER — OXYCODONE HCL 5 MG PO TABS
10.0000 mg | ORAL_TABLET | Freq: Three times a day (TID) | ORAL | Status: DC
  Administered 2020-12-14 – 2020-12-18 (×12): 10 mg via ORAL
  Filled 2020-12-14 (×12): qty 2

## 2020-12-14 MED ORDER — BUSPIRONE HCL 10 MG PO TABS
10.0000 mg | ORAL_TABLET | Freq: Once | ORAL | Status: AC
  Administered 2020-12-14: 10 mg via ORAL
  Filled 2020-12-14: qty 1

## 2020-12-14 MED ORDER — POLYETHYLENE GLYCOL 3350 17 G PO PACK: 17.0000 g | PACK | Freq: Every day | ORAL | Status: DC | PRN

## 2020-12-14 MED ORDER — OXYCODONE-ACETAMINOPHEN 5-325 MG PO TABS
1.0000 | ORAL_TABLET | Freq: Once | ORAL | Status: AC
  Administered 2020-12-14: 1 via ORAL
  Filled 2020-12-14: qty 1

## 2020-12-14 MED ORDER — IPRATROPIUM-ALBUTEROL 0.5-2.5 (3) MG/3ML IN SOLN
3.0000 mL | Freq: Four times a day (QID) | RESPIRATORY_TRACT | Status: DC
  Administered 2020-12-14 – 2020-12-17 (×12): 3 mL via RESPIRATORY_TRACT
  Filled 2020-12-14 (×12): qty 3

## 2020-12-14 MED ORDER — PREDNISONE 20 MG PO TABS
60.0000 mg | ORAL_TABLET | Freq: Once | ORAL | Status: AC
  Administered 2020-12-14: 60 mg via ORAL
  Filled 2020-12-14: qty 3

## 2020-12-14 MED ORDER — IPRATROPIUM BROMIDE 0.02 % IN SOLN
0.5000 mg | Freq: Once | RESPIRATORY_TRACT | Status: AC
  Administered 2020-12-14: 0.5 mg via RESPIRATORY_TRACT
  Filled 2020-12-14: qty 2.5

## 2020-12-14 MED ORDER — OXYCODONE HCL 5 MG PO TABS: 10.0000 mg | ORAL_TABLET | Freq: Three times a day (TID) | ORAL | Status: DC

## 2020-12-14 MED ORDER — ACETAMINOPHEN 325 MG PO TABS: 650.0000 mg | ORAL_TABLET | Freq: Four times a day (QID) | ORAL | Status: DC | PRN

## 2020-12-14 MED ORDER — IPRATROPIUM-ALBUTEROL 0.5-2.5 (3) MG/3ML IN SOLN
3.0000 mL | RESPIRATORY_TRACT | Status: AC
  Filled 2020-12-14: qty 9
  Filled 2020-12-14: qty 3

## 2020-12-14 MED ORDER — UMECLIDINIUM BROMIDE 62.5 MCG/INH IN AEPB
1.0000 | INHALATION_SPRAY | Freq: Every day | RESPIRATORY_TRACT | Status: DC
  Administered 2020-12-14 – 2020-12-17 (×4): 1 via RESPIRATORY_TRACT
  Filled 2020-12-14: qty 7

## 2020-12-14 MED ORDER — BUSPIRONE HCL 5 MG PO TABS
10.0000 mg | ORAL_TABLET | Freq: Two times a day (BID) | ORAL | Status: DC
  Administered 2020-12-14 – 2020-12-17 (×6): 10 mg via ORAL
  Filled 2020-12-14 (×5): qty 2
  Filled 2020-12-14: qty 1

## 2020-12-14 MED ORDER — PREDNISONE 20 MG PO TABS
40.0000 mg | ORAL_TABLET | Freq: Every day | ORAL | Status: AC
  Administered 2020-12-15 – 2020-12-18 (×4): 40 mg via ORAL
  Filled 2020-12-14 (×4): qty 2

## 2020-12-14 MED ORDER — ALBUTEROL SULFATE HFA 108 (90 BASE) MCG/ACT IN AERS
2.0000 | INHALATION_SPRAY | Freq: Once | RESPIRATORY_TRACT | Status: AC
  Administered 2020-12-14: 2 via RESPIRATORY_TRACT
  Filled 2020-12-14: qty 6.7

## 2020-12-14 MED ORDER — LEVOFLOXACIN IN D5W 750 MG/150ML IV SOLN
750.0000 mg | Freq: Once | INTRAVENOUS | Status: AC
  Administered 2020-12-14: 750 mg via INTRAVENOUS
  Filled 2020-12-14: qty 150

## 2020-12-14 MED ORDER — GUAIFENESIN ER 600 MG PO TB12
600.0000 mg | ORAL_TABLET | Freq: Two times a day (BID) | ORAL | Status: DC
  Administered 2020-12-14 – 2020-12-18 (×8): 600 mg via ORAL
  Filled 2020-12-14 (×8): qty 1

## 2020-12-14 MED ORDER — ACETAMINOPHEN 650 MG RE SUPP: 650.0000 mg | Freq: Four times a day (QID) | RECTAL | Status: DC | PRN

## 2020-12-14 MED ORDER — AZITHROMYCIN 250 MG PO TABS
500.0000 mg | ORAL_TABLET | Freq: Every day | ORAL | Status: AC
  Administered 2020-12-14: 500 mg via ORAL
  Filled 2020-12-14: qty 2

## 2020-12-14 MED ORDER — NICOTINE 21 MG/24HR TD PT24
21.0000 mg | MEDICATED_PATCH | Freq: Every day | TRANSDERMAL | Status: DC
  Administered 2020-12-14 – 2020-12-18 (×5): 21 mg via TRANSDERMAL
  Filled 2020-12-14 (×5): qty 1

## 2020-12-14 MED ORDER — FLUTICASONE PROPIONATE 50 MCG/ACT NA SUSP
1.0000 | Freq: Every day | NASAL | Status: DC | PRN
  Filled 2020-12-14: qty 16

## 2020-12-14 NOTE — ED Triage Notes (Signed)
Pt comes via [facility] for SOB from home,, wears 4L at home all the time, has been having increased SOB since yesterday afternoon, upon EMS arrival 66% on her 5L, placed on NRB, absent breath sounds on the L and diminished on the R, PTA received 2 nebs, 133m solumedrol. Hx of COPD,m has had a cough over the past week.

## 2020-12-14 NOTE — ED Provider Notes (Signed)
Signed out by Dr Dayna Barker at 0800 that pt with copd exacerbation, to reassess post meds/labs.   Delay in labs - resent to lab.   Results for orders placed or performed during the hospital encounter of 12/14/20  SARS Coronavirus 2 by RT PCR (hospital order, performed in [place] hospital lab) Nasopharyngeal Nasopharyngeal Swab   Specimen: Nasopharyngeal Swab  Result Value Ref Range   SARS Coronavirus 2 NEGATIVE NEGATIVE  CBC with Differential  Result Value Ref Range   WBC 5.6 4.0 - 10.5 K/uL   RBC 3.63 (L) 3.87 - 5.11 MIL/uL   Hemoglobin 11.0 (L) 12.0 - 15.0 g/dL   HCT 35.6 (L) 36.0 - 46.0 %   MCV 98.1 80.0 - 100.0 fL   MCH 30.3 26.0 - 34.0 pg   MCHC 30.9 30.0 - 36.0 g/dL   RDW 12.9 11.5 - 15.5 %   Platelets 241 150 - 400 K/uL   nRBC 0.0 0.0 - 0.2 %   Neutrophils Relative % 96 %   Neutro Abs 5.3 1.7 - 7.7 K/uL   Lymphocytes Relative 4 %   Lymphs Abs 0.2 (L) 0.7 - 4.0 K/uL   Monocytes Relative 0 %   Monocytes Absolute 0.0 (L) 0.1 - 1.0 K/uL   Eosinophils Relative 0 %   Eosinophils Absolute 0.0 0.0 - 0.5 K/uL   Basophils Relative 0 %   Basophils Absolute 0.0 0.0 - 0.1 K/uL   Immature Granulocytes 0 %   Abs Immature Granulocytes 0.02 0.00 - 0.07 K/uL  Comprehensive metabolic panel  Result Value Ref Range   Sodium 130 (L) 135 - 145 mmol/L   Potassium 4.3 3.5 - 5.1 mmol/L   Chloride 91 (L) 98 - 111 mmol/L   CO2 30 22 - 32 mmol/L   Glucose, Bld 216 (H) 70 - 99 mg/dL   BUN 9 8 - 23 mg/dL   Creatinine, Ser 0.63 0.44 - 1.00 mg/dL   Calcium 8.6 (L) 8.9 - 10.3 mg/dL   Total Protein 5.9 (L) 6.5 - 8.1 g/dL   Albumin 2.9 (L) 3.5 - 5.0 g/dL   AST 18 15 - 41 U/L   ALT 13 0 - 44 U/L   Alkaline Phosphatase 81 38 - 126 U/L   Total Bilirubin 0.5 0.3 - 1.2 mg/dL   GFR, Estimated >60 >60 mL/min   Anion gap 9 5 - 15  POC SARS Coronavirus 2 Ag-ED - Nasal Swab (BD Veritor Kit)  Result Value Ref Range   SARS Coronavirus 2 Ag NEGATIVE NEGATIVE   DG Chest Portable 1 View  Result Date:  12/14/2020 CLINICAL DATA:  Dyspnea EXAM: PORTABLE CHEST 1 VIEW COMPARISON:  10/22/2020, CT 10/22/2020 FINDINGS: The lungs are symmetrically hyperinflated and there is a paucity of vasculature within the lung apices in keeping with changes of emphysema. No superimposed focal pulmonary infiltrate. No pneumothorax or pleural effusion. Cardiac size is within normal limits. No acute bone abnormality. IMPRESSION: Emphysema. No radiographic evidence of acute cardiopulmonary disease. Electronically Signed   By: Fidela Salisbury MD   On: 12/14/2020 05:51    Labs reviewed/interpreted by me  -  Wbc normal.   CXR reviewed/interpreted by me - no pna.   Pt with diminished bs bilateral, poor air movement bilaterally, wheezing. Additional albuterol and atrovent neb txs.   Although mildly improved wob, and air movement, dyspnea persists, with mildly increased o2 requirement, and pt feels requires admission re copd exacerbation.  Medical service consulted for admission.      Lajean Saver,  MD 12/14/20 1323

## 2020-12-14 NOTE — H&P (Addendum)
Date: 12/14/2020               Patient Name:  Kristen [name] MRN: [id number]  DOB: 1946-03-12 Age / Sex: 75 y.o., female   PCP: [name], MD         Medical Service: Internal Medicine Teaching Service         Attending Physician: Dr. Jimmye Norman, Elaina Pattee, MD    First Contact: Dr. Johnney Ou Pager: [phone]  Second Contact: Dr. Gilford Rile Pager: [phone]       After Hours (After 5p/  First Contact Pager: [phone]  weekends / holidays): Second Contact Pager: [phone]   Chief Complaint: Low Oxygen Levels  History of Present Illness:   [name] is a 75 year old person with a PMH of COPD (Home 3-4L O2) and Venous stasis dermatitis of both lower extremities, who presents to the ED after having low O2 levels at home. [name] was in her usual state of health until 2 days ago when she began to have increased productive cough, wheezing, and increased oxygen requirement. She states that while her coughing and wheezing have been progressively worse, she called EMS today because she increased her home O2 to the max settings (6L) and was saturating at 60%. She states that nothing seems to exacerbate her cough and that her breathing treatments in the ED have alleviated her cough. She denies any fevers, headaches, vision changes, chest pain, abdominal pain, constipation, diarrhea, or myalgias.   She does endorse improved, but continued, SHOB and bilateral lower extremity pain which is chronic in nature.  During her ED course, she was found to be COVID 19 negative, normocytic anemia, chronic hyponatremia (Na: 130) and hypochloremia (Cl: 91).   Meds:  Current Meds  Medication Sig  . acetaminophen (TYLENOL) 325 MG tablet Take 325 mg by mouth every 6 (six) hours as needed for mild pain or headache.   . albuterol (PROAIR HFA) 108 (90 Base) MCG/ACT inhaler Inhale 1-2 puffs into the lungs every 6 (six) hours as needed for wheezing or shortness of breath.  . busPIRone (BUSPAR) 10 MG  tablet TAKE 1 TABLET BY MOUTH TWICE A DAY (Patient taking differently: Take 10 mg by mouth 2 (two) times daily.)  . feeding supplement (ENSURE ENLIVE / ENSURE PLUS) LIQD Take 237 mLs by mouth 3 (three) times daily between meals. (Patient taking differently: Take 1 Bottle by mouth 2 (two) times daily between meals.)  . fluticasone (FLONASE) 50 MCG/ACT nasal spray Place 1 spray into both nostrils daily as needed for allergies or rhinitis.  . INCRUSE ELLIPTA 62.5 MCG/INH AEPB INHALE 1 PUFF BY MOUTH EVERY DAY (Patient taking differently: Inhale 1 puff into the lungs daily.)  . ipratropium-albuterol (DUONEB) 0.5-2.5 (3) MG/3ML SOLN TAKE 3 MLS BY NEBULIZATION EVERY 6 (SIX) HOURS AS NEEDED. (Patient taking differently: Take 3 mLs by nebulization every 6 (six) hours as needed (shortness of breath/wheezing.).)  . mometasone-formoterol (DULERA) 200-5 MCG/ACT AERO Inhale 1 puff into the lungs 2 (two) times daily.  . Multiple Vitamin (MULTIVITAMIN WITH MINERALS) TABS tablet Take 1 tablet by mouth daily.  . nicotine (NICODERM CQ - DOSED IN MG/24 HOURS) 21 mg/24hr patch Place 1 patch (21 mg total) onto the skin daily.  . ondansetron (ZOFRAN) 4 MG tablet TAKE 1 TABLET BY MOUTH DAILY AS NEEDED FOR NAUSEA OR VOMITING. (Patient taking differently: Take 4 mg by mouth daily as needed for nausea or vomiting.)  . oxyCODONE-acetaminophen (PERCOCET) 10-325 MG tablet Take 1 tablet  by mouth every 8 (eight) hours.     Allergies: Allergies as of 12/14/2020 - Review Complete 12/14/2020  Allergen Reaction Noted  . Gabapentin Other (See Comments) 06/28/2020   Past Medical History:  Diagnosis Date  . Cellulitis   . Chronic venous stasis dermatitis of both lower extremities    Chronic problem that was being manged by PCP in [place]  . COPD (chronic obstructive pulmonary disease) ([place])   . Dyspnea   . 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  Diabetes Mother   . Diabetes Mellitus II Mother   . Diabetes Father   . 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  ETOH: Drinks socially  Drugs:  None  Review of Systems: A complete ROS was negative except as per HPI.   Physical Exam: Blood pressure 101/62, pulse 86, temperature 98.1 F (36.7 C), temperature source Oral, resp. rate 18, SpO2 100 %. Physical Exam Constitutional:      General: She is not in acute distress.    Comments: Chronically ill appear, cachectic appearing female. Answers questions appropriately.   HENT:     Head: Normocephalic and atraumatic.  Cardiovascular:     Rate and Rhythm: Normal rate and regular rhythm.     Pulses: Normal pulses.     Heart sounds: Normal heart sounds. No murmur heard. No friction rub. No gallop.   Pulmonary:     Effort: Pulmonary effort is normal.     Comments: Wheezes appreciated in the RML. diminished breath sounds bilaterally in the lower lobes Abdominal:     General: Abdomen is flat. Bowel sounds are normal.     Tenderness: There is no abdominal tenderness. There is no guarding.  Musculoskeletal:        General: No swelling or tenderness.     Right lower leg: No edema.     Left lower leg: No edema.     Comments: Bilateral lower extremity lesions, well wrapped with no signs of erythema, purulence, or drainage.   Skin:    General: Skin is warm and dry.  Neurological:     Mental Status: She is alert and oriented to person, place, and time.  Psychiatric:        Mood and Affect: Mood normal.        Behavior: Behavior normal.     EKG: personally reviewed my interpretation is Sinus tach  CXR: personally reviewed my interpretation is emphysematous changes.   Assessment & Plan by Problem: Active  Problems:   COPD exacerbation ([place])   [name] is a 75 year old person with a PMH of COPD (Home 3-4L O2) and Venous stasis dermatitis of both lower extremities, who presents to the ED after having low O2 levels at home, and admitted for management of a COPD Exacerbation.   COPD Exacerbation:  Patient presents with history of COPD exacerbations requiring multiple hospital admissions presents to the ED for similar symptoms. Unable to find her PFTs to calculate GOLD score, but imaging clearly shows emphysematous changes. Increased work of breathing, sputum production, and cough. She is COVID 19 negative She is on multiple home inhalers, and chronically on 3-4L O2 at home. She was  hypoxic this AM at 60% requiring EMS to transport to the hospital. Currently saturating at home 4L with O2 levels of 98%. Patient improved with breathing treatments and steroids in the ED.  - Azithromycin 500 mg followed by 4 days of azithromycin 250 mg daily.  - Duoneb Q6H  - Prednisone 40 mg daily for 5 days.  - Maintain O2 saturations between 88-92%.  - Mucinex 600 mg BID - Incruse Ellipta 62.5 MCG/INH 1 puff  Chronic Venous Stasis Dermatitis of Bilateral Lower Extremities: Patient with chronic stasis dermatitis since she was 63-15 y/o per chart review. On percocet 10-325 for her chronic pain. Patient states that she received 5 mg in the ED but her pain is returning.  - Wound Care consulted for additional recommendations - Percocet 10-325 mg Q8H  Hyponatremia Chronic SIADH:   Patient noted to have chronic hyponatremia with a Na of 130 today, likely in the setting of her COPD with smoking history. Patient asymptomatic during interview and physical examination.   Dispo: Admit patient to Inpatient with expected length of stay greater than 2 midnights.  Signed: Maudie Mercury, MD 12/14/2020, 5:10 PM  Pager: [phone] After 5pm on weekdays and 1pm on weekends: On Call pager: [phone]

## 2020-12-14 NOTE — ED Notes (Signed)
Pt appears in no distress.  Given carafe of coffee.  Lunch ordered.

## 2020-12-14 NOTE — ED Provider Notes (Signed)
[facility] EMERGENCY DEPARTMENT Provider Note   CSN: [id number] Arrival date & time: 12/14/20  0530     History Chief Complaint  Patient presents with  . Shortness of Breath    Kristen [name] is a 75 y.o. female.   Shortness of Breath Severity:  Moderate Duration:  2 days Timing:  Constant Progression:  Worsening Chronicity:  Recurrent Context: activity   Relieved by:  Inhaler Associated symptoms: cough   Associated symptoms: no abdominal pain, no fever and no wheezing        Past Medical History:  Diagnosis Date  . Cellulitis   . Chronic venous stasis dermatitis of both lower extremities    Chronic problem that was being manged by PCP in [place]  . COPD (chronic obstructive pulmonary disease) ([place])   . Dyspnea   . 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 Nausea 11/07/2020  . Acute on chronic respiratory failure with hypoxia and hypercapnia ([place]) 10/22/2020  . Lower extremity ulceration ([place]) 10/04/2020  . Protein-calorie malnutrition, severe 09/26/2020  . COPD exacerbation ([place]) 09/25/2020  . Generalized anxiety disorder 08/27/2020  . 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 Smoking 07/18/2020  . Venous insufficiency of both lower extremities 07/18/2020  . COPD (chronic obstructive pulmonary disease) ([place]) 06/28/2020  . Protein-calorie malnutrition ([place]) 06/28/2020  . Hyponatremia 06/28/2020    Past Surgical History:  Procedure Laterality Date  . VASCULAR SURGERY       OB History   No obstetric history on file.     Family History  Problem Relation Age of Onset  . Diabetes Mother   . Diabetes Mellitus II Mother   . Diabetes Father   . Diverticulitis Sister     Social History   Tobacco Use  . Smoking status: Former Smoker    Packs/day: 0.25    Types: Cigarettes    Quit date: 10/16/2019    Years since quitting: 1.1  . Smokeless tobacco: Never Used  . Tobacco  comment: 2-3 per day   Vaping Use  . Vaping Use: Never used  Substance Use Topics  . Alcohol use: Yes    Comment: occasionally  . Drug use: Never    Home Medications Prior to Admission medications   Medication Sig Start Date End Date Taking? Authorizing Provider  acetaminophen (TYLENOL) 325 MG tablet Take 325 mg by mouth every 6 (six) hours as needed for mild pain or headache.     [provider]  albuterol (PROAIR HFA) 108 (90 Base) MCG/ACT inhaler Inhale 1-2 puffs into the lungs every 6 (six) hours as needed for wheezing or shortness of breath. 12/04/20   Seawell, Jaimie A, DO  busPIRone (BUSPAR) 10 MG tablet TAKE 1 TABLET BY MOUTH TWICE A DAY 11/28/20   Jeralyn Bennett, MD  feeding supplement (ENSURE ENLIVE / ENSURE PLUS) LIQD Take 237 mLs by mouth 3 (three) times daily between meals. Patient taking differently: Take 1 Bottle by mouth 2 (two) times daily between meals.  10/04/20 01/02/21  Rehman, Areeg N, DO  fluticasone (FLONASE) 50 MCG/ACT nasal spray Place 1 spray into both nostrils daily as needed for allergies or rhinitis.    [provider]  INCRUSE ELLIPTA 62.5 MCG/INH AEPB INHALE 1 PUFF BY MOUTH EVERY DAY 11/27/20   Jeralyn Bennett, MD  ipratropium-albuterol (DUONEB) 0.5-2.5 (3) MG/3ML SOLN TAKE 3 MLS BY NEBULIZATION EVERY 6 (SIX) HOURS AS NEEDED. 10/29/20  01/27/21  Sid Falcon, MD  mometasone-formoterol (DULERA) 200-5 MCG/ACT AERO Inhale 1 puff into the lungs 2 (two) times daily. 12/04/20   Seawell, Jaimie A, DO  Multiple Vitamin (MULTIVITAMIN WITH MINERALS) TABS tablet Take 1 tablet by mouth daily.    [provider]  nicotine (NICODERM CQ - DOSED IN MG/24 HOURS) 21 mg/24hr patch Place 1 patch (21 mg total) onto the skin daily. 10/24/20   Lavina Hamman, MD  nutrition supplement, Fanny Dance, Fanny Dance) PACK Take 1 packet by mouth 2 (two) times daily with a meal. 10/24/20   Lavina Hamman, MD  ondansetron (ZOFRAN) 4 MG tablet TAKE 1 TABLET BY MOUTH DAILY AS NEEDED  FOR NAUSEA OR VOMITING. 11/27/20   Jeralyn Bennett, MD  oxyCODONE-acetaminophen (PERCOCET) 10-325 MG tablet Take 1 tablet by mouth every 8 (eight) hours. 12/04/20 01/03/21  Seawell, Jaimie A, DO  predniSONE (DELTASONE) 10 MG tablet Take 32m daily for 3days,Take 320mdaily for 3days,Take 2055maily for 3days,Take 38m36mily for 3days, then stop 10/24/20   PateLavina Hamman    Allergies    Gabapentin  Review of Systems   Review of Systems  Constitutional: Negative for fever.  Respiratory: Positive for cough and shortness of breath. Negative for wheezing.   Gastrointestinal: Negative for abdominal pain.  All other systems reviewed and are negative.   Physical Exam Updated Vital Signs BP (!) 104/54   Pulse 91   Temp 98.9 F (37.2 C) (Oral)   Resp 16   SpO2 96%   Physical Exam Vitals and nursing note reviewed.  Constitutional:      Appearance: She is well-developed and well-nourished.  HENT:     Head: Normocephalic and atraumatic.  Cardiovascular:     Rate and Rhythm: Normal rate and regular rhythm.  Pulmonary:     Effort: Tachypnea present. No respiratory distress.     Breath sounds: No stridor. Decreased breath sounds present.  Abdominal:     General: There is no distension.  Musculoskeletal:     Cervical back: Normal range of motion.     Right lower leg: No edema.     Left lower leg: No edema.  Skin:    General: Skin is warm and dry.  Neurological:     General: No focal deficit present.     Mental Status: She is alert.     ED Results / Procedures / Treatments   Labs (all labs ordered are listed, but only abnormal results are displayed) Labs Reviewed  SARS CORONAVIRUS 2 BY RT PCR ([facility] ORDER, [facility])  CBC WITH DIFFERENTIAL/PLATELET  COMPREHENSIVE METABOLIC PANEL  POC SARS CORONAVIRUS 2 AG -  ED    EKG EKG Interpretation  Date/Time:  Saturday December 14 2020 05:38:31 EST Ventricular Rate:  107 PR Interval:    QRS  Duration: 88 QT Interval:  330 QTC Calculation: 441 R Axis:   84 Text Interpretation: Sinus tachycardia Non-specific ST-t changes Baseline wander Confirmed by SteiLajean [id number] 12/14/2020 7:03:25 AM   Radiology DG Chest Portable 1 View  Result Date: 12/14/2020 CLINICAL DATA:  Dyspnea EXAM: PORTABLE CHEST 1 VIEW COMPARISON:  10/22/2020, CT 10/22/2020 FINDINGS: The lungs are symmetrically hyperinflated and there is a paucity of vasculature within the lung apices in keeping with changes of emphysema. No superimposed focal pulmonary infiltrate. No pneumothorax or pleural effusion. Cardiac size is within normal limits. No acute bone abnormality. IMPRESSION: Emphysema. No radiographic evidence of acute cardiopulmonary disease. Electronically Signed  By: Fidela Salisbury MD   On: 12/14/2020 05:51    Procedures Procedures (including critical care time)  Medications Ordered in ED Medications  ipratropium-albuterol (DUONEB) 0.5-2.5 (3) MG/3ML nebulizer solution 3 mL (has no administration in time range)  levofloxacin (LEVAQUIN) IVPB 750 mg (has no administration in time range)  albuterol (VENTOLIN HFA) 108 (90 Base) MCG/ACT inhaler 2 puff (2 puffs Inhalation Given 12/14/20 0617)  predniSONE (DELTASONE) tablet 60 mg (60 mg Oral Given 12/14/20 3015)    ED Course  I have reviewed the triage vital signs and the nursing notes.  Pertinent labs & imaging results that were available during my care of the patient were reviewed by me and considered in my medical decision making (see chart for details).    MDM Rules/Calculators/A&P                          Significantly improved breathing after EMS treatments. Prednisone given. No cp. No infectious symptoms aside form increasing productive cough. Will check covid and give breathing tx. covid negative, still feels much better. Plan for duonebs and reassessment. c Care transferred pending reevaluation and ultimate disposition.   Final Clinical  Impression(s) / ED Diagnoses Final diagnoses:  None    Rx / DC Orders ED Discharge Orders    None       Horrace Hanak, Corene Cornea, MD 12/14/20 [phone]

## 2020-12-15 ENCOUNTER — Other Ambulatory Visit: Payer: Self-pay

## 2020-12-15 ENCOUNTER — Encounter (HOSPITAL_COMMUNITY): Payer: Self-pay | Admitting: Internal Medicine

## 2020-12-15 LAB — BASIC METABOLIC PANEL
Anion gap: 12 (ref 5–15)
BUN: 9 mg/dL (ref 8–23)
CO2: 28 mmol/L (ref 22–32)
Calcium: 9 mg/dL (ref 8.9–10.3)
Chloride: 90 mmol/L — ABNORMAL LOW (ref 98–111)
Creatinine, Ser: 0.66 mg/dL (ref 0.44–1.00)
GFR, Estimated: >60 mL/min (ref >60)
Glucose, Bld: 97 mg/dL (ref 70–99)
Potassium: 4 mmol/L (ref 3.5–5.1)
Sodium: 130 mmol/L — ABNORMAL LOW (ref 135–145)

## 2020-12-15 LAB — CBC
HCT: 33.3 % — ABNORMAL LOW (ref 36.0–46.0)
Hemoglobin: 11.1 g/dL — ABNORMAL LOW (ref 12.0–15.0)
MCH: 31.1 pg (ref 26.0–34.0)
MCHC: 33.3 g/dL (ref 30.0–36.0)
MCV: 93.3 fL (ref 80.0–100.0)
Platelets: 249 10*3/uL (ref 150–400)
RBC: 3.57 MIL/uL — ABNORMAL LOW (ref 3.87–5.11)
RDW: 12.8 % (ref 11.5–15.5)
WBC: 7.5 10*3/uL (ref 4.0–10.5)
nRBC: 0 % (ref 0.0–0.2)

## 2020-12-15 MED ORDER — SALINE SPRAY 0.65 % NA SOLN: 1.0000 | NASAL | Status: DC | PRN

## 2020-12-15 NOTE — Progress Notes (Addendum)
Subjective:  ON events: None  Patient seen at bedside this AM. Kristen [name] states that she feel particularly weak today. She endorses these feelings as she gets out of bed. She states that her breathing treatments are helping her with her COPD. She is unsure what the inciting event for her symptoms.   Objective:  Vital signs in last 24 hours: Vitals:   12/15/20 0335 12/15/20 0400 12/15/20 0406 12/15/20 0511  BP: (!) 122/98  (!) 164/95   Pulse: 95  95   Resp: 20  20   Temp: 98.2 F (36.8 C)  98.9 F (37.2 C)   TempSrc: Oral  Oral   SpO2: 94%  100% 97%  Weight:  41 kg    Height:  5' 10" (1.778 m)     Physical Exam Constitutional:      Comments: Alert, resting comfortably in bed, chronically ill in appearance, no acute distress.   HENT:     Head: Normocephalic and atraumatic.  Cardiovascular:     Rate and Rhythm: Normal rate and regular rhythm.     Pulses: Normal pulses.     Heart sounds: Normal heart sounds.  Pulmonary:     Effort: Pulmonary effort is normal.     Comments: On 4L saturating at 96%. No increased work of breathing. Air movement improved from yesterday. Diminished air sounds in the bases bilaterally. Faint wheezing on the RML.  Neurological:     Mental Status: She is alert.  Psychiatric:        Mood and Affect: Mood normal.        Behavior: Behavior normal.      LABS:  CBC Latest Ref Rng & Units 12/15/2020 12/14/2020 10/24/2020  WBC 4.0 - 10.5 K/uL 7.5 5.6 6.2  Hemoglobin 12.0 - 15.0 g/dL 11.1(L) 11.0(L) 11.0(L)  Hematocrit 36.0 - 46.0 % 33.3(L) 35.6(L) 33.7(L)  Platelets 150 - 400 K/uL 249 241 235    BMP Latest Ref Rng & Units 12/15/2020 12/14/2020 10/24/2020  Glucose 70 - 99 mg/dL 97 216(H) 127(H)  BUN 8 - 23 mg/dL _0 Creatinine 0.44 - 1.00 mg/dL 0.66 0.63 0.53  BUN/Creat Ratio 12 - 28 - - -  Sodium 135 - 145 mmol/L 130(L) 130(L) 130(L)  Potassium 3.5 - 5.1 mmol/L 4.0 4.3 4.3  Chloride 98 - 111 mmol/L 90(L) 91(L) 95(L)  CO2 22 - 32 mmol/L _1 Calcium 8.9 - 10.3 mg/dL 9.0 8.6(L) 8.6(L)    Assessment/Plan:  Active Problems:   COPD exacerbation (HCC)  Kristen [name] is a 75 year old person with a PMH of COPD (Home 3-4L O2) and Venous stasis dermatitis of both lower extremities, who presents to the ED after having low O2 levels at home, and admitted for management of a COPD Exacerbation.   COPD Exacerbation:  On home O2 of 4L saturating at 97-100%. - Azithromycin 250 mg daily for 1/4 days.   - Duoneb Q6H  - Prednisone 40 mg daily for 2/5 days.  - Maintain O2 saturations between 88-92%.  - Mucinex 600 mg BID - Incruse Ellipta 62.5 MCG/INH 1 puff  Chronic Venous Stasis Dermatitis of Bilateral Lower Extremities: - Wound Care consulted for additional recommendations - Percocet 10-325 mg Q8H  Hyponatremia Chronic SIADH:   Patient noted to have chronic hyponatremia with a Na of 130 today, likely in the setting of her COPD with smoking history. Patient asymptomatic during interview and physical examination. Will monitor while hospitalized. -Trend BMP  Prior to Admission Living Arrangement: Home Anticipated Discharge Location: Home  Barriers to Discharge:Continued medical work up Dispo: Anticipated discharge in approximately 1-2 day(s).   Maudie Mercury, MD 12/15/2020, 6:26 AM Pager: [phone] After 5pm on weekdays and 1pm on weekends: On Call pager [phone]

## 2020-12-15 NOTE — Progress Notes (Signed)
New Admission Note:   Arrival Method: Via stretcher from ED Mental Orientation:  A & O x 4 Telemetry: N/A Assessment: Completed Skin:  Chronic Bilateral Venous Stasis Ulcers - WOC RN Consult - fragile skin IV:  LT FA NSL Pain: Chronic BLE Pain - Scheduled Tylenol and Oxycodone Tubes:  None Safety Measures: Safety Fall Prevention Plan has been given, discussed and signed Admission: Completed 5 MW Orientation: Patient has been orientated to the room, unit and staff.  Family:  Lives with step sister  Patient is on Home O2 @ 4 LPM.  Advised about valuables policy.  She only has a necklace.  Orders have been reviewed and implemented. Will continue to monitor the patient. Call light has been placed within reach and bed alarm has been activated.   Earleen Reaper RN- BC, [facility] Phone Number [phone]

## 2020-12-16 DIAGNOSIS — J441 Chronic obstructive pulmonary disease with (acute) exacerbation: Principal | ICD-10-CM

## 2020-12-16 DIAGNOSIS — I480 Paroxysmal atrial fibrillation: Secondary | ICD-10-CM | POA: Diagnosis not present

## 2020-12-16 DIAGNOSIS — I872 Venous insufficiency (chronic) (peripheral): Secondary | ICD-10-CM

## 2020-12-16 DIAGNOSIS — I4891 Unspecified atrial fibrillation: Secondary | ICD-10-CM

## 2020-12-16 LAB — TSH: TSH: 1.099 u[IU]/mL (ref 0.350–4.500)

## 2020-12-16 MED ORDER — METOPROLOL TARTRATE 5 MG/5ML IV SOLN
5.0000 mg | Freq: Once | INTRAVENOUS | Status: AC | PRN
  Administered 2020-12-16: 5 mg via INTRAVENOUS

## 2020-12-16 MED ORDER — METOPROLOL TARTRATE 5 MG/5ML IV SOLN
5.0000 mg | INTRAVENOUS | Status: DC | PRN
  Filled 2020-12-16: qty 5

## 2020-12-16 MED ORDER — ADENOSINE 12 MG/4ML IV SOLN
12.0000 mg | Freq: Once | INTRAVENOUS | Status: AC
  Administered 2020-12-16: 12 mg via INTRAVENOUS
  Filled 2020-12-16: qty 4

## 2020-12-16 MED ORDER — ADENOSINE 6 MG/2ML IV SOLN
6.0000 mg | Freq: Once | INTRAVENOUS | Status: AC
  Administered 2020-12-16: 6 mg via INTRAVENOUS
  Filled 2020-12-16: qty 2

## 2020-12-16 MED ORDER — SODIUM CHLORIDE 0.9 % IV BOLUS: 500.0000 mL | Freq: Once | INTRAVENOUS | Status: DC

## 2020-12-16 MED ORDER — METOPROLOL TARTRATE 5 MG/5ML IV SOLN
INTRAVENOUS | Status: AC
  Administered 2020-12-16: 5 mg via INTRAVENOUS
  Filled 2020-12-16: qty 5

## 2020-12-16 MED ORDER — METOPROLOL TARTRATE 5 MG/5ML IV SOLN: 5.0000 mg | INTRAVENOUS | Status: DC | PRN

## 2020-12-16 MED ORDER — METOPROLOL TARTRATE 25 MG PO TABS
25.0000 mg | ORAL_TABLET | Freq: Two times a day (BID) | ORAL | Status: DC
  Administered 2020-12-16 – 2020-12-18 (×5): 25 mg via ORAL
  Filled 2020-12-16 (×6): qty 1

## 2020-12-16 NOTE — Progress Notes (Signed)
HD#2 Subjective:   Early this morning just prior to start of rounds, cross cover paged by RN reporting patient with P 150s-170s. Night team at bedside along with rapid response RN, adenosine given, AED pads in place. Irregular rhythm seen, consistent with atrial fibrillation. Day team at bedside. Patient alert and oriented through the event and was asymptomatic. Denies heart being in "funny rhythm." 5 mg metoprolol given. MAP of 70, heart rate of 140's. Repeat MAP of 94, additional 5 mg of metoprolol given with improvement to 130s.   Objective:   Vital signs in last 24 hours: Vitals:   12/16/20 0609 12/16/20 0700 12/16/20 0710 12/16/20 0720  BP: (!) 131/99 112/75 111/81 (!) 127/92  Pulse: (!) 108 (!) 190 (!) 183 (!) 180  Resp:    18  Temp:      TempSrc:      SpO2:      Weight:      Height:       Supplemental O2: Nasal Cannula SpO2: 95 % O2 Flow Rate (L/min): 3 L/min  Physical Exam Constitutional: chronically ill-appearing woman sitting up in bed, in no acute distress Cardiovascular: tachycardic, irregularly irregular rhythm, no m/r/g Pulmonary/Chest: normal work of breathing on 3 L [place], lungs clear to auscultation bilaterally Abdominal: soft, non-tender, non-distended   Filed Weights   12/15/20 0400  Weight: 41 kg     Intake/Output Summary (Last 24 hours) at 12/16/2020 0739 Last data filed at 12/16/2020 0600 Gross per 24 hour  Intake 600 ml  Output 700 ml  Net -100 ml   Net IO Since Admission: 481.33 mL [12/16/20 0739]  Pertinent Labs: CBC Latest Ref Rng & Units 12/15/2020 12/14/2020 10/24/2020  WBC 4.0 - 10.5 K/uL 7.5 5.6 6.2  Hemoglobin 12.0 - 15.0 g/dL 11.1(L) 11.0(L) 11.0(L)  Hematocrit 36.0 - 46.0 % 33.3(L) 35.6(L) 33.7(L)  Platelets 150 - 400 K/uL 249 241 235    CMP Latest Ref Rng & Units 12/15/2020 12/14/2020 10/24/2020  Glucose 70 - 99 mg/dL 97 216(H) 127(H)  BUN 8 - 23 mg/dL _0 Creatinine 0.44 - 1.00 mg/dL 0.66 0.63 0.53  Sodium 135 - 145 mmol/L  130(L) 130(L) 130(L)  Potassium 3.5 - 5.1 mmol/L 4.0 4.3 4.3  Chloride 98 - 111 mmol/L 90(L) 91(L) 95(L)  CO2 22 - 32 mmol/L _1 Calcium 8.9 - 10.3 mg/dL 9.0 8.6(L) 8.6(L)  Total Protein 6.5 - 8.1 g/dL - 5.9(L) -  Total Bilirubin 0.3 - 1.2 mg/dL - 0.5 -  Alkaline Phos 38 - 126 U/L - 81 -  AST 15 - 41 U/L - 18 -  ALT 0 - 44 U/L - 13 -    Imaging: None  Assessment/Plan:   Active Problems:   COPD exacerbation (HCC)   Patient Summary:  [name] is a 75 year old person with a PMH of COPD (Home 3-4L O2) and Venous stasis dermatitis of both lower extremities, who presents to the ED after having low O2 levels at home, and admitted for management of a COPD Exacerbation.  This is hospital day 2.  COPD Exacerbation: On home O2 of 3L saturating at >95%.  - Azithromycin 250 mg daily for 3/5 days.   - Duoneb Q6H  - Prednisone 40 mg daily for 3/5 days.  - Maintain O2 saturations between 88-92%.  - Mucinex 600 mg BID - Incruse Ellipta 62.5 MCG/INH 1 puff  New-onset atrial fibrillation with RVR Patient found to be in asymptomatic Afib with  RVR this morning, P 150s-170s. Received adenosine x 1, metoprolol 5 mg IV x 2, with improvement to P 130s. CHA2DS2-VASc Score = 4 based on age (2), gender (1), and history of vascular disease (1). HAS-BLED score of 1 for age. Suspect patient's lung disease is underlying cause of Afib, will obtain TSH and echo for further evaluation. Will start PO metoprolol for rate control. Also will consult TOC pharmacy to see which NOAC would be favorable for her insurance. - metoprolol 25 mg twice daily - TOC pharmacy consult for affordability of Xarelto vs Eliquis - TSH - Echocardiogram  Chronic Venous Stasis Dermatitis of Bilateral Lower Extremities: - Wound Care consulted for additional recommendations - Percocet 10-325 mg Q8H  Hyponatremia Chronic SIADH: Patient noted to have chronic hyponatremia with a Na of 130 yesterday, likely in the  setting of her COPD with smoking history. Patient asymptomatic during interview and physical examination.Will monitor while hospitalized. - AM BMP  Diet: Normal IVF: None VTE: Enoxaparin Code: Full  PT/OT recs: Pending TOC recs: Pending   Dispo: Anticipated discharge pending further evaluation and treatment of new-onset atrial fibrillation.   Please contact the on call pager after 5 pm and on weekends at [phone].  Alexandria Lodge, MD PGY-1 Internal Medicine Teaching Service Pager: [phone] 12/16/2020

## 2020-12-16 NOTE — Progress Notes (Signed)
PT Cancellation Note  Patient Details Name: Kristen [name] MRN: [id number] DOB: Aug 18, 1946   Cancelled Treatment:    Reason Eval/Treat Not Completed: Medical issues which prohibited therapy HR still tachycardic at rest at 136BPM at most (about 94% of age predicted HR max). Will follow and return when medically ready.   Windell Norfolk, DPT, PN1   Supplemental Physical Therapist [facility]    Pager [phone] Acute Rehab Office [phone]

## 2020-12-16 NOTE — Consult Note (Addendum)
WOC Nurse Consult Note: Patient receiving care in [facility] 5M11. Reason for Consult: bilateral venous wounds Wound type: partial thickness wounds to bilateral medial malleoli Pressure Injury POA: Yes/No/NA Measurement: right ankle wound measures 4 cm x 2.2 cm x 0.1 cm.  Wound bed is red, no odor. Aquacel and kerlex were over the wound. Left ankle wound was 3 cm x 3 cm x 0.2 cm. Wound bed is red, no odor. Aquacel and kerlex were over the the wound. The patient tells me she does her own wound care at home, and the dressings I removed were the ones she placed last week on Friday. Wound bed: Drainage (amount, consistency, odor)  Periwound: intact, bilaterally Dressing procedure/placement/frequency:  Place a small piece of Xeroform gauze over each inner ankle wound, secure with kerlex.  Today, I cleansed the wounds with saline, and placed vasline gauze and kerlex. I have requested 4 Xeroform gauzes to be ordered by the [name].  Also, for a pair of Prevalon boots.  Monitor the wound area(s) for worsening of condition such as: Signs/symptoms of infection,  Increase in size,  Development of or worsening of odor, Development of pain, or increased pain at the affected locations.  Notify the medical team if any of these develop.  Thank you for the consult.  Discussed plan of care with the patient and bedside nurse.  [place] nurse will not follow at this time.  Please re-consult the [place] team if needed.  Val Riles, RN, MSN, CWOCN, CNS-BC, pager [phone]

## 2020-12-16 NOTE — Progress Notes (Signed)
Rapid Response Event Note   Reason for Call : Called d/t HR 170-180 sustained   Initial Focused Assessment:  Pt A&O x4  No complaints of pain or SOB, skin cool and dry.  HR 180's, BP 131/99, AFIB RVR. IMTS at bedside PTA RRT RN vagal maneuvers attempted with no success.     Interventions:  EKG- AFIB RVR Addressed code status, changed from DNR to [facility] Adenosine 73m no  Response Adenosine 138mno response  Metoprolol 72m107miven- HR 140s-150s  Plan of Care:  Pt has orders to tx to cardiac Tele floor when bed available, rate control w/ Metoprolol as able. Call RRT RN if further assistance needed     CouDevota PaceN

## 2020-12-16 NOTE — Progress Notes (Signed)
Earlier this morning, a 75 year old female presented to the hospital and went into atrial fibrillation. The patient has a past medical history of chronic obstructive pulmonary disorder and venous stasis dermatitis. Due to the patient experiencing atrial fibrillation, guidelines recommend the patient should begin anticoagulation therapy. The patient is now at a higher risk of experiencing a stroke. Her CHA2DS2-VASc score is 4, which is moderate to high risk, and she has a  HAS-BLED risk score of 1, which is low risk. Based on her current renal function, she should start treatment on apixaban 5 mg twice a day or rivaroxaban 15 mg daily is recommended. Her serum creatinine is 0.66 mg/dL, and her creatinine clearance is 39.9 mL/min. Depending on which medication is at a cheaper expense would determine which medication option would be appropriate. I did discuss this information with Dr. Shon Baton, and she found my recommendations appropriate for this patient.  Kristen [name], Student Pharmacist

## 2020-12-16 NOTE — Progress Notes (Signed)
Initial Nutrition Assessment  DOCUMENTATION CODES:   Severe malnutrition in context of chronic illness,Underweight  INTERVENTION:   Ensure Enlive po TID, each supplement provides 350 kcal and 20 grams of protein  Magic cup TID with meals, each supplement provides 290 kcal and 9 grams of protein  MVI with minerals daily   NUTRITION DIAGNOSIS:   Severe Malnutrition related to chronic illness (pulmonary cachexia 2/2 COPD) as evidenced by severe muscle depletion,severe fat depletion.    GOAL:   Patient will meet greater than or equal to 90% of their needs    MONITOR:   PO intake,Supplement acceptance,Skin,Weight trends,Labs,I & O's  REASON FOR ASSESSMENT:   Malnutrition Screening Tool    ASSESSMENT:   Pt with PMH significant for COPD (on 3-4L O2 @ baseline), pulmonary cachexia, and venous stasis dermatitis of BLE admitted for COPD exacerbation.  Per MD, pt was found to be in asymptomatic Afib with RVR this morning. Pt possibly discharging today. Pt noted by MD to be "fiercly independent."  Reviewed weight history. No significant weight changes noted.   PO Intake: 50-75% x3 recorded meals (58% average meal intake)  UOP: 771m x24 hours  Labs: Na 130 (L) Medications: deltasone  NUTRITION - FOCUSED PHYSICAL EXAM:  Flowsheet Row Most Recent Value  Orbital Region Severe depletion  Upper Arm Region Severe depletion  Thoracic and Lumbar Region Severe depletion  Buccal Region Severe depletion  Temple Region Severe depletion  Clavicle Bone Region Severe depletion  Clavicle and Acromion Bone Region Severe depletion  Scapular Bone Region Severe depletion  Dorsal Hand Severe depletion  Patellar Region Severe depletion  Anterior Thigh Region Severe depletion  Posterior Calf Region Severe depletion  Edema (RD Assessment) None  Hair Reviewed  Eyes Reviewed  Mouth Reviewed  Skin Reviewed  Nails Reviewed       Diet Order:   Diet Order            Diet regular  Room service appropriate? Yes; Fluid consistency: Thin  Diet effective now                 EDUCATION NEEDS:   No education needs have been identified at this time  Skin:  Skin Assessment: Skin Integrity Issues: Skin Integrity Issues:: Other (Comment) Other: venous stasis ulcer BLE  Last BM:  12/15/20  Height:   Ht Readings from Last 1 Encounters:  12/15/20 5' 10" (1.778 m)    Weight:   Wt Readings from Last 1 Encounters:  12/15/20 41 kg    BMI:  Body mass index is 12.97 kg/m.  Estimated Nutritional Needs:   Kcal:  1500-1700  Protein:  75-85 grams  Fluid:  >/=1.5L/d    ALarkin Ina MS, RD, LDN RD pager number and weekend/on-call pager number located in [phone]

## 2020-12-16 NOTE — Progress Notes (Signed)
Patient laying in bed, resting, NAD. HR at 83, NSR. Will continue to monitor patient.

## 2020-12-16 NOTE — Progress Notes (Signed)
OT Cancellation Note  Patient Details Name: Kristen [name] MRN: [id number] DOB: 06-13-1946   Cancelled Treatment:    Reason Eval/Treat Not Completed: Medical issues which prohibited therapy. Pt noted to be tachy. At rest, HR 140s-160s. Plan to hold therapy this AM. Will follow-up for OT eval as medically appropriate.  Layla Maw 12/16/2020, 8:55 AM

## 2020-12-17 DIAGNOSIS — F32A Depression, unspecified: Secondary | ICD-10-CM

## 2020-12-17 LAB — CBC WITH DIFFERENTIAL/PLATELET
Abs Immature Granulocytes: 0.03 10*3/uL (ref 0.00–0.07)
Basophils Absolute: 0 10*3/uL (ref 0.0–0.1)
Basophils Relative: 0 %
Eosinophils Absolute: 0.1 10*3/uL (ref 0.0–0.5)
Eosinophils Relative: 2 %
HCT: 39.3 % (ref 36.0–46.0)
Hemoglobin: 12.7 g/dL (ref 12.0–15.0)
Immature Granulocytes: 0 %
Lymphocytes Relative: 31 %
Lymphs Abs: 2.4 10*3/uL (ref 0.7–4.0)
MCH: 30.3 pg (ref 26.0–34.0)
MCHC: 32.3 g/dL (ref 30.0–36.0)
MCV: 93.8 fL (ref 80.0–100.0)
Monocytes Absolute: 0.6 10*3/uL (ref 0.1–1.0)
Monocytes Relative: 8 %
Neutro Abs: 4.5 10*3/uL (ref 1.7–7.7)
Neutrophils Relative %: 59 %
Platelets: 294 10*3/uL (ref 150–400)
RBC: 4.19 MIL/uL (ref 3.87–5.11)
RDW: 13 % (ref 11.5–15.5)
WBC: 7.7 10*3/uL (ref 4.0–10.5)
nRBC: 0 % (ref 0.0–0.2)

## 2020-12-17 LAB — BASIC METABOLIC PANEL
Anion gap: 8 (ref 5–15)
BUN: 14 mg/dL (ref 8–23)
CO2: 30 mmol/L (ref 22–32)
Calcium: 8.8 mg/dL — ABNORMAL LOW (ref 8.9–10.3)
Chloride: 90 mmol/L — ABNORMAL LOW (ref 98–111)
Creatinine, Ser: 0.68 mg/dL (ref 0.44–1.00)
GFR, Estimated: >60 mL/min (ref >60)
Glucose, Bld: 88 mg/dL (ref 70–99)
Potassium: 3.2 mmol/L — ABNORMAL LOW (ref 3.5–5.1)
Sodium: 128 mmol/L — ABNORMAL LOW (ref 135–145)

## 2020-12-17 LAB — GLUCOSE, CAPILLARY: Glucose-Capillary: 100 mg/dL — ABNORMAL HIGH (ref 70–99)

## 2020-12-17 MED ORDER — POTASSIUM CHLORIDE CRYS ER 20 MEQ PO TBCR
40.0000 meq | EXTENDED_RELEASE_TABLET | Freq: Two times a day (BID) | ORAL | Status: AC
  Administered 2020-12-17 (×2): 40 meq via ORAL
  Filled 2020-12-17 (×2): qty 2

## 2020-12-17 MED ORDER — POTASSIUM CHLORIDE 20 MEQ PO PACK
40.0000 meq | PACK | Freq: Two times a day (BID) | ORAL | Status: DC
  Administered 2020-12-17: 40 meq via ORAL
  Filled 2020-12-17 (×2): qty 2

## 2020-12-17 MED ORDER — COVID-19 MRNA VACC (MODERNA) 100 MCG/0.5ML IM SUSP
0.5000 mL | Freq: Once | INTRAMUSCULAR | Status: AC
  Administered 2020-12-17: 0.5 mL via INTRAMUSCULAR
  Filled 2020-12-17: qty 0.5

## 2020-12-17 MED ORDER — IPRATROPIUM-ALBUTEROL 0.5-2.5 (3) MG/3ML IN SOLN
3.0000 mL | Freq: Two times a day (BID) | RESPIRATORY_TRACT | Status: DC
  Administered 2020-12-18: 3 mL via RESPIRATORY_TRACT
  Filled 2020-12-17: qty 3

## 2020-12-17 MED ORDER — MIRTAZAPINE 15 MG PO TABS
15.0000 mg | ORAL_TABLET | Freq: Every day | ORAL | Status: DC
  Administered 2020-12-17: 15 mg via ORAL
  Filled 2020-12-17: qty 1

## 2020-12-17 MED ORDER — RIVAROXABAN 15 MG PO TABS
15.0000 mg | ORAL_TABLET | Freq: Every day | ORAL | Status: DC
  Administered 2020-12-17 – 2020-12-18 (×2): 15 mg via ORAL
  Filled 2020-12-17 (×2): qty 1

## 2020-12-17 NOTE — Progress Notes (Signed)
HD#3 Subjective:   No acute events overnight.  During evaluation at bedside, patient states she feels weak but feels better after lunch. Reports she was able to walk down the hall with PT and is on her home 3L. She is eating well. Shares with team that prior to presenting to the hospital this admission she had some nausea which was responsive to Zofran and has since improved. She shares that she was scared by episode of atrial fibrillation yesterday. Asks for reason of event. Counseling provided on atrial fibrillation and need for anticoagulation. She becomes tearful when discussing events of the last year, death in the family. States she wishes her appetite could be improved. Shares that buspirone prescribed for anxiety has been marginally helpful. Is open to trying mirtazapine for mood benefit and appetite stimulant. Also expresses desire for COVID vaccination.     Objective:   Vital signs in last 24 hours: Vitals:   12/16/20 2000 12/16/20 2214 12/17/20 0232 12/17/20 0551  BP:  130/79  (!) 146/88  Pulse:  84  81  Resp:  16  16  Temp:  98.1 F (36.7 C)  98.1 F (36.7 C)  TempSrc:    Oral  SpO2: 96% 94% 96% 92%  Weight:      Height:       Supplemental O2: Nasal Cannula SpO2: 92 % O2 Flow Rate (L/min): 3 L/min  Physical Exam Constitutional: pleasant, chronically ill-appearing woman sitting up in bed, in no acute distress Cardiovascular: regular rate and rhythm, no m/r/g Pulmonary/Chest: No dyspnea or increased work of breathing on 3 L , lungs clear to auscultation bilaterally Abdominal: soft, non-tender, non-distended Psych: tearful when describing traumatic events of the last year (death of relative, falling out with a friend)   [employer]   12/15/20 0400  Weight: 41 kg     Intake/Output Summary (Last 24 hours) at 12/17/2020 0742 Last data filed at 12/16/2020 2200 Gross per 24 hour  Intake 240 ml  Output 900 ml  Net -660 ml   Net IO Since Admission: -178.67 mL  [12/17/20 0742]  Pertinent Labs: CBC Latest Ref Rng & Units 12/17/2020 12/15/2020 12/14/2020  WBC 4.0 - 10.5 K/uL 7.7 7.5 5.6  Hemoglobin 12.0 - 15.0 g/dL 12.7 11.1(L) 11.0(L)  Hematocrit 36.0 - 46.0 % 39.3 33.3(L) 35.6(L)  Platelets 150 - 400 K/uL 294 249 241    CMP Latest Ref Rng & Units 12/17/2020 12/15/2020 12/14/2020  Glucose 70 - 99 mg/dL 88 97 216(H)  BUN 8 - 23 mg/dL _0 Creatinine 0.44 - 1.00 mg/dL 0.68 0.66 0.63  Sodium 135 - 145 mmol/L 128(L) 130(L) 130(L)  Potassium 3.5 - 5.1 mmol/L 3.2(L) 4.0 4.3  Chloride 98 - 111 mmol/L 90(L) 90(L) 91(L)  CO2 22 - 32 mmol/L _1 Calcium 8.9 - 10.3 mg/dL 8.8(L) 9.0 8.6(L)  Total Protein 6.5 - 8.1 g/dL - - 5.9(L)  Total Bilirubin 0.3 - 1.2 mg/dL - - 0.5  Alkaline Phos 38 - 126 U/L - - 81  AST 15 - 41 U/L - - 18  ALT 0 - 44 U/L - - 13   Imaging: None  Assessment/Plan:   Active Problems:   COPD exacerbation (HCC)   Paroxysmal atrial fibrillation with rapid ventricular response [facility])   Patient Summary:  [name] is a 74 year old person with a PMH of COPD (Home 3-4L O2) and Venous stasis dermatitis of both lower extremities, who presents to the ED after  having low O2 levels at home, and admitted for management of a COPD Exacerbation.  This is hospital day 3.  COPD Exacerbation: On home O2 of 3L saturating at >95%.  - Azithromycin 250 mg daily for 4/5 days.   - Prednisone 40 mg daily for 4/5 days.  - Duoneb Q6H  - Maintain O2 saturations between 88-92%.  - Mucinex 600 mg BID - Incruse Ellipta 62.5 MCG/INH 1 puff  New-onset atrial fibrillation with RVR Patient with episode of Afib with RVR yesterday. No indication for repeat echo with normal echo in 10/2020. TSH wnl. NSR today and rate-controlled on metoprolol. Given CHA2DS2-VASc Score = 4 and HAS-BLED score of 1, benefit of anticoagulation outweighs bleeding risk. Will initiate Xarelto. - Continue metoprolol 25 mg twice daily - Start Xarelto 15 mg daily -  Pharmacy student to provide additional education at bedside  Depressed mood and appetite Patient reports she has struggled with depressed mood in the last year in the setting of death in the family and health struggles, including poor appetite. She is willing to trial mirtazapine. Requests we discontinue buspirone.  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 - AM BMP   Diet: Normal IVF: None VTE: Enoxaparin-> Rivaroxaban Code: Full  PT/OT recs: HH PT/OT   Please contact the on call pager after 5 pm and on weekends at [phone].  Kristen Lodge, MD PGY-1 Internal Medicine Teaching Service Pager: [phone] 12/17/2020

## 2020-12-17 NOTE — Evaluation (Signed)
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 Pt reports Independence in ADLs, IADLs and mobility without AD. Pt wears 3 L O2 at baseline. Pt presents now with chronic deficits in cardiopulmonary tolerance, dynamic standing balance, and strength. Pt overall min guard for mobility to/from bathroom without AD. Initially unsteady but improving with prolonged activity. Pt Independent for UB ADLs and min guard for LB ADLs due to deficits. Pt noted with 3/4 DOE after toileting task, SpO2 93% on 3 L O2 and HR WFL throughout. Provided energy conservation handout and initial education on strategies. Encouraged pt to use shower chair and Rollator at home to maximize safety and independence. Recommend HHOT follow-up and 24/7 supervision.    Follow Up Recommendations  Home health OT;Supervision/Assistance - 24 hour    Equipment Recommendations  None recommended by OT (has all needed DME)    Recommendations for Other Services       Precautions / Restrictions Precautions Precautions: Fall;Other (comment) Precaution Comments: monitor O2, HR and SOB Restrictions Weight Bearing Restrictions: No      Mobility Bed Mobility Overal bed mobility: Modified Independent             General bed mobility comments: HOB elevated, use of bedrail but no assist needed    Transfers Overall transfer level: Needs assistance Equipment used: 1 person hand  held assist;None Transfers: Sit to/from Stand Sit to Stand: Min guard         General transfer comment: min guard for sit to stand without AD, unsteadiness noted but improving with increased activity    Balance Overall balance assessment: Needs assistance Sitting-balance support: No upper extremity supported;Feet supported Sitting balance-Leahy Scale: Fair     Standing balance support: No upper extremity supported;During functional activity Standing balance-Leahy Scale: Fair Standing balance comment: fair static standing, mild unsteadiness without UE support during mobility                           ADL either performed or assessed with clinical judgement   ADL Overall ADL's : Needs assistance/impaired Eating/Feeding: Independent;Sitting   Grooming: Min guard;Standing   Upper Body Bathing: Independent;Sitting   Lower Body Bathing: Min guard;Sit to/from stand   Upper Body Dressing : Independent;Sitting   Lower Body Dressing: Minimal assistance;Sit to/from stand   Toilet Transfer: Min guard;Ambulation;Regular Glass blower/designer Details (indicate cue type and reason): min guard for safety, some unsteadiness without UE support Toileting- Clothing Manipulation and Hygiene: Sitting/lateral lean;Min guard;Sit to/from stand Toileting - Clothing Manipulation Details (indicate cue type and reason): min guard in standing for safety with clothing mgmt     Functional mobility during ADLs: Min guard General ADL Comments: Limited by decreased cardiopulmonary tolerance, strength and dynamic balance. Pt with desire to avoid DME use if possible     Vision Baseline Vision/History: Wears glasses Wears Glasses: Reading only Patient Visual Report: No change from baseline Vision Assessment?: No apparent visual deficits     Perception     Praxis      Pertinent  Vitals/Pain Pain Assessment: No/denies pain     Hand Dominance Right   Extremity/Trunk Assessment Upper  Extremity Assessment Upper Extremity Assessment: Generalized weakness   Lower Extremity Assessment Lower Extremity Assessment: Defer to PT evaluation   Cervical / Trunk Assessment Cervical / Trunk Assessment: Normal   Communication Communication Communication: No difficulties   Cognition Arousal/Alertness: Awake/alert Behavior During Therapy: WFL for tasks assessed/performed;Impulsive Overall Cognitive Status: Within Functional Limits for tasks assessed                                 General Comments: A&Ox4, pleasant and cooperative. A bit fast and impulsive with movements with decreased awareness of unsteadiness but good problem solving and receptive to safety cues   General Comments  Pt on 3 L O2 (reports this is baseline). HR 70s at rest. Increased WOB and SOB after mobility to/from bathroom, SpO2 93% on 3 L. Cued for pursed lip breathing. Provided energy conservation handout and encouraged to use shower chair at home, consider Rollator for longer distances    Exercises     Shoulder Instructions      Home Living Family/patient expects to be discharged to:: Private residence Living Arrangements: Other relatives Available Help at Discharge: Family;Available 24 hours/day (with sister 24/7, niece lives nearby) Type of Home: House Home Access: Ramped entrance     Home Layout: One level     Bathroom Shower/Tub: Occupational psychologist: Standard Bathroom Accessibility: Yes How Accessible: Accessible via walker Home Equipment: Coin - single point;Shower seat;Wheelchair - [employer] - 2 wheels;Walker - 4 wheels   Additional Comments: equipment was brother in [place], in storage at sister's home      Prior Functioning/Environment Level of Independence: Independent        Comments: Reports Independence with ADLs, IADls, and mobility. No use of AD. Sister assists with O2 tank mgmt when out shopping in community. Reports sister is nearby for  showering tasks to ensure safety        OT Problem List: Decreased strength;Decreased activity tolerance;Impaired balance (sitting and/or standing);Cardiopulmonary status limiting activity;Decreased knowledge of use of DME or AE      OT Treatment/Interventions: Self-care/ADL training;Therapeutic exercise;Energy conservation;DME and/or AE instruction;Therapeutic activities;Patient/family education;Balance training    OT Goals(Current goals can be found in the care plan section) Acute Rehab OT Goals Patient Stated Goal: breathe better, obtain smaller O2 tank for community outings OT Goal Formulation: With patient Time For Goal Achievement: 12/31/20 Potential to Achieve Goals: Good ADL Goals Pt Will Perform Grooming: with modified independence;standing Pt Will Transfer to Toilet: with modified independence;ambulating;regular height toilet Additional ADL Goal #1: Pt to demonstrate implementation of at least 2 energy conservation strategies during ADLs/IADLs Additional ADL Goal #2: Pt to verbalize at least 2 fall prevention strategies to maximize safety at home  OT Frequency: Min 2X/week   Barriers to D/C:            Co-evaluation              AM-PAC OT "6 Clicks" Daily Activity     Outcome Measure Help from another person eating meals?: None Help from another person taking care of personal grooming?: A Little Help from another person toileting, which includes using toliet, bedpan, or urinal?: A Little Help from another person bathing (including washing, rinsing, drying)?: A Little Help from another person to put on and taking off regular upper body clothing?: None Help from another  person to put on and taking off regular lower body clothing?: A Little 6 Click Score: 20   End of Session Equipment Utilized During Treatment: Oxygen Nurse Communication: Mobility status  Activity Tolerance: Patient tolerated treatment well Patient left: in bed;with call bell/phone within  reach;with nursing/sitter in room (NT in assessing vitals)  OT Visit Diagnosis: Unsteadiness on feet (R26.81);Other abnormalities of gait and mobility (R26.89);Muscle weakness (generalized) (M62.81)                Time: 0321-2248 OT Time Calculation (min): 27 min Charges:  OT General Charges $OT Visit: 1 Visit OT Evaluation $OT Eval Moderate Complexity: 1 Mod OT Treatments $Self Care/Home Management : 8-22 mins  Layla Maw, OTR/L  Layla Maw 12/17/2020, 10:58 AM

## 2020-12-17 NOTE — Plan of Care (Signed)
  Problem: Coping: Goal: Level of anxiety will decrease Outcome: Progressing   Problem: Pain Managment: Goal: General experience of comfort will improve Outcome: Progressing

## 2020-12-17 NOTE — Progress Notes (Signed)
Transitions of Care Pharmacist Note  Kristen [name] is a 75 y.o. female that has been diagnosed with A Fib and will be prescribed Xarelto (rivaroxaban) at discharge.   Patient Education: I provided the following education on Xarelto to the patient: How to take the medication Described what the medication is Signs of bleeding Signs/symptoms of VTE and stroke  Answered their questions  Discharge Medications Plan: The patient wants to have their discharge medications filled by the Transitions of Care pharmacy rather than their usual pharmacy.  The discharge orders pharmacy has been changed to the Transitions of Care pharmacy, the patient will receive a phone call regarding co-pay, and their medications will be delivered by the Transitions of Care pharmacy.   Thank you,   Rebbeca Paul, PharmD PGY1 Pharmacy Resident 12/17/2020 4:29 PM  Please check [email or web address] for unit-specific pharmacy phone numbers.

## 2020-12-17 NOTE — Plan of Care (Signed)
  Problem: Coping: Goal: Level of anxiety will decrease Outcome: Progressing   Problem: Pain Managment: Goal: General experience of comfort will improve Outcome: Progressing   Problem: Safety: Goal: Ability to remain free from injury will improve Outcome: Progressing   Problem: Education: Goal: Knowledge of disease or condition will improve Outcome: Progressing

## 2020-12-17 NOTE — Evaluation (Signed)
Physical Therapy Evaluation Patient Details Name: Namita Hartzell MRN: [id number] DOB: 05/07/1946 Today's Date: 12/17/2020   History of Present Illness  75-year-old Ms. 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 Tolerated gait about 80ft in the hallway with min guard/no device, initially unsteady but balance improved with ongoing mobility today. Signal was not great, but I feel that she did likely desat to mid-80s with extended activity on 3LPM but was able to recover well, HR no more than 107BPM with activity. Politely declines up to chair due to having had a busy morning, left in bed with all needs met. Will benefit from return to HHPT at DC.     Follow Up Recommendations Home health PT    Equipment Recommendations  Other (comment) (rollator)    Recommendations for Other Services       Precautions / Restrictions Precautions Precautions: Fall;Other (comment) Precaution Comments: monitor O2, HR and SOB Restrictions Weight Bearing Restrictions: No      Mobility  Bed Mobility Overal bed mobility: Modified Independent             General bed mobility comments: HOB elevated, use of bedrail but no assist needed    Transfers Overall transfer level: Needs assistance Equipment used: None Transfers: Sit to/from Stand Sit to Stand: Min guard         General transfer comment: min guard to power up and light MinA to gain balance in standing, unsteady but balance did improve with increased  activity  Ambulation/Gait Ambulation/Gait assistance: Min guard Gait Distance (Feet): 80 Feet Assistive device: None Gait Pattern/deviations: Step-through pattern;Decreased step length - left;Decreased step length - right;Narrow base of support Gait velocity: decreased   General Gait Details: slow but balance/steadiness increased significantly as we progressed mobility; did not have great signal on pulse ox, believe she may have desatted to as low as 85% on 3LPM, HR no more than 107BPM at most  Stairs            Wheelchair Mobility    Modified Rankin (Stroke Patients Only)       Balance Overall balance assessment: Needs assistance Sitting-balance support: No upper extremity supported;Feet supported Sitting balance-Leahy Scale: Good     Standing balance support: No upper extremity supported;During functional activity Standing balance-Leahy Scale: Fair Standing balance comment: fair static standing, mild unsteadiness without UE support during mobility                             Pertinent Vitals/Pain Pain Assessment: No/denies pain    Home Living Family/patient expects to be discharged to:: Private residence Living Arrangements: Other relatives Available Help at Discharge: Family;Available 24 hours/day (with sister 24/7, niece lives nearby) Type of Home: House Home Access: Ramped entrance     Home Layout: One level Home Equipment: Cane - single point;Shower seat;Wheelchair - manual;Walker - 2 wheels;Walker - 4 wheels Additional Comments: equipment was brother in law's, in storage at sister's home    Prior Function Level of Independence: Independent         Comments: Reports Independence with ADLs, IADls, and mobility. No use of   AD. Sister assists with O2 tank mgmt when out shopping in community. Reports sister is nearby for showering tasks to ensure safety     Hand Dominance   Dominant Hand: Right    Extremity/Trunk Assessment   Upper  Extremity Assessment Upper Extremity Assessment: Defer to OT evaluation    Lower Extremity Assessment Lower Extremity Assessment: Generalized weakness    Cervical / Trunk Assessment Cervical / Trunk Assessment: Normal  Communication   Communication: No difficulties  Cognition Arousal/Alertness: Awake/alert Behavior During Therapy: WFL for tasks assessed/performed;Anxious Overall Cognitive Status: Within Functional Limits for tasks assessed                                 General Comments: pleasant and cooperative, anxious with mobility      General Comments General comments (skin integrity, edema, etc.): 3LPM for all activity today, likely desat to 85% with gait in hallway but recovered well with seated rest/PLB. HR no more than 107BPM.    Exercises     Assessment/Plan    PT Assessment Patient needs continued PT services  PT Problem List Decreased strength;Decreased knowledge of use of DME;Decreased activity tolerance;Decreased balance;Decreased mobility;Cardiopulmonary status limiting activity       PT Treatment Interventions DME instruction;Balance training;Gait training;Stair training;Functional mobility training;Patient/family education;Therapeutic activities;Therapeutic exercise    PT Goals (Current goals can be found in the Care Plan section)  Acute Rehab PT Goals Patient Stated Goal: breathe better, obtain smaller O2 tank for community outings PT Goal Formulation: With patient Time For Goal Achievement: 12/31/20 Potential to Achieve Goals: Good    Frequency Min 3X/week   Barriers to discharge        Co-evaluation               AM-PAC PT "6 Clicks" Mobility  Outcome Measure Help needed turning from your back to your side while in a flat bed without using bedrails?: None Help needed moving from lying on your back to sitting on the side of a flat bed without using bedrails?: None Help needed moving to and from a bed to a chair (including a  wheelchair)?: A Little Help needed standing up from a chair using your arms (e.g., wheelchair or bedside chair)?: A Little Help needed to walk in hospital room?: A Little Help needed climbing 3-5 steps with a railing? : A Lot 6 Click Score: 19    End of Session Equipment Utilized During Treatment: Gait belt;Oxygen Activity Tolerance: Patient tolerated treatment well Patient left: in bed;with call bell/phone within reach Nurse Communication: Mobility status PT Visit Diagnosis: Muscle weakness (generalized) (M62.81);Difficulty in walking, not elsewhere classified (R26.2)    Time: 0347-4259 PT Time Calculation (min) (ACUTE ONLY): 23 min   Charges:   PT Evaluation $PT Eval Moderate Complexity: 1 Mod PT Treatments $Gait Training: 8-22 mins        Windell Norfolk, DPT, PN1   Supplemental Physical Therapist [place]    Pager [phone] Acute Rehab Office [phone]

## 2020-12-18 ENCOUNTER — Other Ambulatory Visit (HOSPITAL_COMMUNITY): Payer: Self-pay | Admitting: Student

## 2020-12-18 ENCOUNTER — Telehealth: Payer: Self-pay | Admitting: Student

## 2020-12-18 LAB — BASIC METABOLIC PANEL
Anion gap: 9 (ref 5–15)
BUN: 17 mg/dL (ref 8–23)
CO2: 27 mmol/L (ref 22–32)
Calcium: 9.1 mg/dL (ref 8.9–10.3)
Chloride: 94 mmol/L — ABNORMAL LOW (ref 98–111)
Creatinine, Ser: 0.62 mg/dL (ref 0.44–1.00)
GFR, Estimated: >60 mL/min (ref >60)
Glucose, Bld: 82 mg/dL (ref 70–99)
Potassium: 4.3 mmol/L (ref 3.5–5.1)
Sodium: 130 mmol/L — ABNORMAL LOW (ref 135–145)

## 2020-12-18 MED ORDER — RIVAROXABAN 15 MG PO TABS: 15.0000 mg | ORAL_TABLET | Freq: Every day | ORAL | 0 refills | Status: DC

## 2020-12-18 MED ORDER — MIRTAZAPINE 15 MG PO TABS: 15.0000 mg | ORAL_TABLET | Freq: Every day | ORAL | 0 refills | Status: DC

## 2020-12-18 MED ORDER — METOPROLOL SUCCINATE ER 25 MG PO TB24: 50.0000 mg | ORAL_TABLET | Freq: Every day | ORAL | 0 refills | Status: DC

## 2020-12-18 MED FILL — MIRTAZAPINE 15 MG TABLET: 15 | 30 days supply | Qty: 30 | Fill #0

## 2020-12-18 MED FILL — METOPROLOL SUCCINATE ER 25: 25 | 30 days supply | Qty: 60 | Fill #0

## 2020-12-18 MED FILL — XARELTO 15 MG TABLET: 15 | 30 days supply | Qty: 30 | Fill #0

## 2020-12-18 NOTE — Care Management Important Message (Signed)
Important Message  Patient Details  Name: Kristen [name] MRN: [id number] Date of Birth: Mar 17, 1946   Medicare Important Message Given:  Yes - Important Message mailed due to current National Emergency   Verbal consent obtained due to current National Emergency  Relationship to patient: Self Contact Name: Corlis Angelica Call Date: 12/18/20  Time: [place] Phone: [phone] Outcome: Spoke with contact Important Message mailed to: Patient address on file     Delorse Lek 12/18/2020, 2:57 PM

## 2020-12-18 NOTE — Progress Notes (Signed)
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 SPO2 remained in the low 90s on 3LPM for in room activities, however she again seemed to desat down into the mid-80s with gait in hallway on 3LPM, needed 4-5 LPM to recover after gait. Not able to tolerate as far of a gait distance today due to fatigue. HR to 114BPM today. Declined up to recliner. Left in bed with all needs met this morning.     Follow Up Recommendations  Home health PT     Equipment Recommendations  Other (comment) (rollator)    Recommendations for Other Services       Precautions / Restrictions Precautions Precautions: Fall;Other (comment) Precaution Comments: monitor O2, HR and SOB Restrictions Weight Bearing Restrictions: No    Mobility  Bed Mobility Overal bed mobility: Modified Independent             General bed mobility comments: HOB elevated, use of bedrail but no assist needed  Transfers Overall transfer level: Needs assistance Equipment used: None Transfers: Sit to/from Stand Sit to Stand: Min guard         General transfer comment: heavier min guard for balance/steadying today, more fatigued and seemed weaker in general  Ambulation/Gait Ambulation/Gait assistance: Min assist Gait Distance (Feet): 60 Feet Assistive device: None Gait Pattern/deviations: Step-through pattern;Decreased step length - left;Decreased step length -  right;Narrow base of support Gait velocity: decreased   General Gait Details: much more SOB today and needed intermittent HHA to steady especially when turning; not able to tolerate same gait distance as yesterday. Pulxe ox unreliable but did still seem to desat to mid-80s on 3LPM, needed 4-5 LPM to recover   Stairs             Wheelchair Mobility    Modified Rankin (Stroke Patients Only)       Balance Overall balance assessment: Needs assistance Sitting-balance support: No upper extremity supported;Feet supported Sitting balance-Leahy Scale: Good     Standing balance support: No upper extremity supported;During functional activity Standing balance-Leahy Scale: Poor Standing balance comment: MinA for balance on turns                            Cognition Arousal/Alertness: Awake/alert Behavior During Therapy: WFL for tasks assessed/performed;Anxious Overall Cognitive Status: Within Functional Limits for tasks assessed                                 General Comments: pleasant and cooperative, anxious with mobility      Exercises      General Comments General comments (skin integrity, edema, etc.): 3LPM for activity, pulse ox unreliable due to poor signal but she still seemed to desat into the 80s, actualy needed 4-5LPM to recover with seated rest. HR up to 114      Pertinent Vitals/Pain Pain Assessment: No/denies pain    Home Living  Prior Function            PT Goals (current goals can now be found in the care plan section) Acute Rehab PT Goals Patient Stated Goal: breathe better, obtain smaller O2 tank for community outings PT Goal Formulation: With patient Time For Goal Achievement: 12/31/20 Potential to Achieve Goals: Good Progress towards PT goals: Progressing toward goals    Frequency    Min 3X/week      PT Plan Current plan remains appropriate    Co-evaluation               AM-PAC PT "6 Clicks" Mobility   Outcome Measure  Help needed turning from your back to your side while in a flat bed without using bedrails?: None Help needed moving from lying on your back to sitting on the side of a flat bed without using bedrails?: None Help needed moving to and from a bed to a chair (including a wheelchair)?: A Little Help needed standing up from a chair using your arms (e.g., wheelchair or bedside chair)?: A Little Help needed to walk in hospital room?: A Little Help needed climbing 3-5 steps with a railing? : A Lot 6 Click Score: 19    End of Session Equipment Utilized During Treatment: Gait belt;Oxygen Activity Tolerance: Patient tolerated treatment well Patient left: in bed;with call bell/phone within reach Nurse Communication: Mobility status PT Visit Diagnosis: Muscle weakness (generalized) (M62.81);Difficulty in walking, not elsewhere classified (R26.2)     Time: 2863-8177 PT Time Calculation (min) (ACUTE ONLY): 28 min  Charges:  $Gait Training: 8-22 mins $Therapeutic Activity: 8-22 mins                     Kristen [name], DPT, PN1   Supplemental Physical Therapist [place]    Pager [phone] Acute Rehab Office [phone]

## 2020-12-18 NOTE — TOC Transition Note (Addendum)
Transition of Care [facility]) - CM/SW Discharge Note   Patient Details  Name: Kristen [name] MRN: [id number] Date of Birth: 1946-09-01  Transition of Care [facility]) CM/SW Contact:  Ninfa Meeker, RN Phone Number: 12/18/2020, 10:23 AM   Clinical Narrative:   Case manager spoke with patient concerning discharge plan and needs. Patient states she has a Marine scientist from [place] coming to her hoe already. CM contacted Adela Lank, [facility] Liaison to confirm and to add therapies. Patient has all necessary DME.  10:45a: Patient expressed concerns about her oxygen concentrator and the size of her tanks. She states she is able to drive and wants to have lighter oxygen tanks to remain active. Case Manager contacted Bethanne Ginger with [employer] and discussed her concerns, He will contact patient and discuss her options.    Final next level of care: [place] Barriers to Discharge: No Barriers Identified   Patient Goals and CMS Choice Patient states their goals for this hospitalization and ongoing recovery are:: go home   Choice offered to / list presented to : Patient  Discharge Placement                       Discharge Plan and Services   Discharge Planning Services: CM Consult Post Acute Care Choice: Resumption of Svcs/PTA Provider (is active with [facility])          DME Arranged: N/A         HH Arranged: RN,PT,OT [place] Agency: [place] Date [place]: 12/18/20 Time HH Agency Contacted: 1010 Representative spoke with at [place]: [place] ([place]) Interventions     Readmission Risk Interventions Readmission Risk Prevention Plan 09/29/2020  Transportation Screening Complete  PCP or Specialist Appt within 5-7 Days Complete  Home Care Screening Complete  Medication Review (RN CM) Complete  Some recent data might be hidden

## 2020-12-18 NOTE — Telephone Encounter (Signed)
TOC HFU [facility] FOR 12/26/2020 @ 1:45 PM WITH DR KATSADUOROS PER DR Shon Baton.

## 2020-12-18 NOTE — Progress Notes (Signed)
DISCHARGE NOTE HOME Marnell Mcdaniel to be discharged Home per MD order. Discussed prescriptions and follow up appointments with the patient. Prescriptions given to patient; medication list explained in detail. Patient verbalized understanding.  Skin clean, dry and intact without evidence of skin break down, no evidence of skin tears noted. IV catheter discontinued intact. Site without signs and symptoms of complications. Dressing and pressure applied. Pt denies pain at the site currently. No complaints noted.  Patient free of lines, drains, and wounds.   An After Visit Summary (AVS) was printed and given to the patient. Patient escorted via wheelchair, and discharged home via private auto.  Orville Govern, RN3

## 2020-12-18 NOTE — Discharge Instructions (Signed)
Ms. Gural,  It was a pleasure taking care of you in the hospital.   - You were admitted to the hospital for a COPD exacerbation. We treated you with steroids and an anti-inflammatory antibiotic, azithromycin.  - During this hospitalization, your heart went into a rhythm called atrial fibrillation (more information is attached). When the heart enters this rhythm, it can make you predisposed to develop a blot clot, which can lead to a stroke. In order to prevent this, we started you on a blood thinner called rivaroxaban (Xarelto). To control your heart rate, we started you on a beta blocker, metoprolol.  - We started you on a medication called mirtazapine (Remeron) to help with your mood and appetite. We discontinued your buspirone. - You received your Moderna vaccine #1 on 12/17/20. You will eligible for your Moderna vaccine #2 on 01/14/21.  We would like you to follow-up in the Internal Medicine Center in the next week. Someone will call you to schedule this appointment.  Take care!    Information on my medicine - XARELTO (Rivaroxaban)  This medication education was reviewed with me or my healthcare representative as part of my discharge preparation.  Why was Xarelto prescribed for you? Xarelto was prescribed for you to reduce the risk of a blood clot forming that can cause a stroke if you have a medical condition called atrial fibrillation (a type of irregular heartbeat).  What do you need to know about xarelto ? Take your Xarelto ONCE DAILY at the same time every day with your evening meal. If you have difficulty swallowing the tablet whole, you may crush it and mix in applesauce just prior to taking your dose.  Take Xarelto exactly as prescribed by your doctor and DO NOT stop taking Xarelto without talking to the doctor who prescribed the medication.  Stopping without other stroke prevention medication to take the place of Xarelto may increase your risk of developing a clot that  causes a stroke.  Refill your prescription before you run out.  After discharge, you should have regular check-up appointments with your healthcare provider that is prescribing your Xarelto.  In the future your dose may need to be changed if your kidney function or weight changes by a significant amount.  What do you do if you miss a dose? If you are taking Xarelto ONCE DAILY and you miss a dose, take it as soon as you remember on the same day then continue your regularly scheduled once daily regimen the next day. Do not take two doses of Xarelto at the same time or on the same day.   Important Safety Information A possible side effect of Xarelto is bleeding. You should call your healthcare provider right away if you experience any of the following: ? Bleeding from an injury or your nose that does not stop. ? Unusual colored urine (red or dark brown) or unusual colored stools (red or black). ? Unusual bruising for unknown reasons. ? A serious fall or if you hit your head (even if there is no bleeding).  Some medicines may interact with Xarelto and might increase your risk of bleeding while on Xarelto. To help avoid this, consult your healthcare provider or pharmacist prior to using any new prescription or non-prescription medications, including herbals, vitamins, non-steroidal anti-inflammatory drugs (NSAIDs) and supplements.  This website has more information on Xarelto: [email or web address]

## 2020-12-18 NOTE — Progress Notes (Signed)
Earlier this week, a 75 year old female presented to the hospital and went into atrial fibrillation. The patient has a past medical history of chronic obstructive pulmonary disorder and venous stasis dermatitis. Due to the patient experiencing atrial fibrillation, guidelines recommend the patient should begin anticoagulation therapy. The decision was made to initiate rivaroxaban. Based on her current renal function, she should start treatment on rivaroxaban 15 mg daily is recommended. Her serum creatinine is 0.66 mg/dL, and her creatinine clearance is 39.9 mL/min. Patient education is important for this patient since this is a new medication. The team asked me to counsel the patient on her new atrial fibrillation regimen. I discussed how important it is for her to take this medication with food, especially because she has recently had a decreased appetite. However, the team also initiated mirtazapine to help her mood and appetite. I explained to the patient the indication for her Xarelto and the possible side effects. I did discuss how there is an increased risk of bleeding with this medication which she will need to monitor and tell her provider if she notices any bleeding or bruising.     Neomia Glass, Student Pharmacist

## 2020-12-18 NOTE — Discharge Summary (Addendum)
Name: Kristen [name] MRN: [id number] DOB: Nov 09, 1946 75 y.o. PCP: Kristen Dame, MD  Date of Admission: 12/14/2020  5:30 AM Date of Discharge: 12/18/2020 Attending Physician: Lucious Groves, DO  Discharge Diagnosis:  1. COPD Exacerbation  2. New-onset atrial fibrillation with RVR 3. Depressed mood and appetite  Discharge Medications: Allergies as of 12/18/2020       Reactions   Gabapentin Other (See Comments)   Hallucination/ nightmares        Medication List     STOP taking these medications    busPIRone 10 MG tablet Commonly known as: BUSPAR       TAKE these medications    acetaminophen 325 MG tablet Commonly known as: TYLENOL Take 325 mg by mouth every 6 (six) hours as needed for mild pain or headache.   albuterol 108 (90 Base) MCG/ACT inhaler Commonly known as: ProAir HFA Inhale 1-2 puffs into the lungs every 6 (six) hours as needed for wheezing or shortness of breath.   feeding supplement Liqd Take 237 mLs by mouth 3 (three) times daily between meals. What changed: when to take this   fluticasone 50 MCG/ACT nasal spray Commonly known as: FLONASE Place 1 spray into both nostrils daily as needed for allergies or rhinitis.   Incruse Ellipta 62.5 MCG/INH Aepb Generic drug: umeclidinium bromide INHALE 1 PUFF BY MOUTH EVERY DAY What changed: See the new instructions.   ipratropium-albuterol 0.5-2.5 (3) MG/3ML Soln Commonly known as: DUONEB TAKE 3 MLS BY NEBULIZATION EVERY 6 (SIX) HOURS AS NEEDED. What changed: reasons to take this   metoprolol succinate 25 MG 24 hr tablet Commonly known as: Toprol XL Take 2 tablets (50 mg total) by mouth daily.   mirtazapine 15 MG tablet Commonly known as: REMERON Take 1 tablet (15 mg total) by mouth at bedtime.   mometasone-formoterol 200-5 MCG/ACT Aero Commonly known as: DULERA Inhale 1 puff into the lungs 2 (two) times daily.   multivitamin with minerals Tabs tablet Take 1 tablet by mouth daily.    nicotine 21 mg/24hr patch Commonly known as: NICODERM CQ - dosed in mg/24 hours Place 1 patch (21 mg total) onto the skin daily.   ondansetron 4 MG tablet Commonly known as: ZOFRAN TAKE 1 TABLET BY MOUTH DAILY AS NEEDED FOR NAUSEA OR VOMITING. What changed: See the new instructions.   oxyCODONE-acetaminophen 10-325 MG tablet Commonly known as: Percocet Take 1 tablet by mouth every 8 (eight) hours.   Rivaroxaban 15 MG Tabs tablet Commonly known as: XARELTO Take 1 tablet (15 mg total) by mouth daily. Start taking on: December 19, 2020               Discharge Care Instructions  (From admission, onward)           Start     Ordered   12/18/20 0000  Discharge wound care:        12/18/20 1249            Disposition and follow-up:   [name] was discharged from [facility] in Stable condition.  At the hospital follow up visit please address:  COPD Exacerbation  - Home Oxygen needs, patient with broken portable O2 system. - Resolution of symptoms.   New-onset paroxysmal atrial fibrillation with RVR - Titrate Metoprolol extended release 50 mg daily as needed.  - Ensure adherence to rivaroxaban (Xarelto) 15 mg daily  Depressed mood and appetite - Assess if Mirtazapine 15 mg QHS increases mood and appetite and titrate as  needed  COVID-19 vaccination - Patient received Moderna vaccine #1 on 12/17/20. Will be eligible for Moderna vaccine #2 on 01/14/21.  2.  Labs / imaging needed at time of follow-up: BMP  3.  Pending labs/ test needing follow-up: None  Follow-up Appointments:  Follow-up Information     Care, [facility] Follow up.   Specialty: [place] Why: A representative from [place] will contact you to arrange start date and time for your therapies.  Contact information: [place] [phone]         Kristen Dame, MD. Schedule an appointment as soon as possible for  a visit in 1 week(s).   Specialty: Internal Medicine Contact information: [place] [address] [place] Hospital Course by problem list:  COPD Exacerbation  Acute on Chronic Respiratory Failure with hypoxia and hypercapnia Patient presented to the ED with increased cough, sputum production, and new oxygen requirement of 6L (baseline of 3-4L) still saturating ~60%. She was started on azithromycin, prednisone, mucinex, and duoneb treatment. She completed 5 day course of antibiotics and prednisone, and was discharge in stable condition on her home O2 requirement of 3-4L.  New-Onset Atrial Fibrillation with RVR Patient was found by nursing staff on the morning of 12/16/20 to have a pulse of 150-170s. Night team and day team presented to bedside where patient was found to have variable heart rates in the 150s-200s. Patient underwent 89m followed by 12 mg of adenosine with no improvement. 549mlopressor dropped her pulse to 130s. She was started on metoprolol 25 mg twice daily with her pulse returning to normal. She had no other episodes during her hospitalization. In NSR at discharge. Given CHA2DS2-VASc Score = 4 and HAS-BLED score of 1, She was discharged in stable condition on metoprolol XL 50 mg daily and Xarelto 15 mg daily.   Depressed Mood and Appetite Patient reports she has struggled with depressed mood in the last year in the setting of death in the family and health struggles, including poor appetite. Was started on mirtazapine 15 mg QHs. Patient noted some improvement. Discharged in stable condition on mirtazapine.    Day of Discharge SUBJECTIVE:   During evaluation at bedside, patient states she is doing well. She received her COVID vaccine yesterday in her left arm. She endorses left arm soreness. States tolerating the mirtazapine well. She states she is ready to get out of here today.    She does have some concern about her oxygen tanks at  home, she notes that she has to refill them herself and that they are quite large.   Discharge Exam:   BP 126/83 (BP Location: Right Arm)   Pulse 75   Temp 98.2 F (36.8 C) (Oral)   Resp 19   Ht _0  (1.778 m)   Wt 41 kg   SpO2 94%   BMI 12.97 kg/m  Discharge exam:  Physical Exam Constitutional:      Interventions: She is not intubated.    Comments: Sitting comfortably in bed, chronically ill and chacetic in appearance, no acute distress.   HENT:     Head: Normocephalic and atraumatic.  Cardiovascular:     Rate and Rhythm: Normal rate and regular rhythm.     Heart sounds: No murmur heard. No friction rub. No gallop.   Pulmonary:     Effort: Pulmonary effort is normal.  No accessory muscle usage or respiratory distress. She is not intubated.     Breath sounds: Normal breath sounds. No decreased breath sounds, wheezing, rhonchi or rales.  Musculoskeletal:     Right lower leg: No tenderness.     Left lower leg: No tenderness.  Neurological:     Mental Status: She is alert and oriented to person, place, and time.  Psychiatric:        Mood and Affect: Mood normal.        Behavior: Behavior normal.    Pertinent Labs, Studies, and Procedures:  EXAM: PORTABLE CHEST 1 VIEW   COMPARISON:  10/22/2020, CT 10/22/2020   FINDINGS: The lungs are symmetrically hyperinflated and there is a paucity of vasculature within the lung apices in keeping with changes of emphysema. No superimposed focal pulmonary infiltrate. No pneumothorax or pleural effusion. Cardiac size is within normal limits. No acute bone abnormality.   IMPRESSION: Emphysema. No radiographic evidence of acute cardiopulmonary Disease.  CBC Latest Ref Rng & Units 12/17/2020 12/15/2020 12/14/2020  WBC 4.0 - 10.5 K/uL 7.7 7.5 5.6  Hemoglobin 12.0 - 15.0 g/dL 12.7 11.1(L) 11.0(L)  Hematocrit 36.0 - 46.0 % 39.3 33.3(L) 35.6(L)  Platelets 150 - 400 K/uL 294 249 241   BMP Latest Ref Rng & Units 12/18/2020 12/17/2020  12/15/2020  Glucose 70 - 99 mg/dL 82 88 97  BUN 8 - 23 mg/dL _0 Creatinine 0.44 - 1.00 mg/dL 0.62 0.68 0.66  BUN/Creat Ratio 12 - 28 - - -  Sodium 135 - 145 mmol/L 130(L) 128(L) 130(L)  Potassium 3.5 - 5.1 mmol/L 4.3 3.2(L) 4.0  Chloride 98 - 111 mmol/L 94(L) 90(L) 90(L)  CO2 22 - 32 mmol/L _1 Calcium 8.9 - 10.3 mg/dL 9.1 8.8(L) 9.0    Discharge Instructions: Discharge Instructions     Call MD for:  difficulty breathing, headache or visual disturbances   Complete by: As directed    Call MD for:  extreme fatigue   Complete by: As directed    Call MD for:  hives   Complete by: As directed    Call MD for:  persistant dizziness or light-headedness   Complete by: As directed    Call MD for:  persistant nausea and vomiting   Complete by: As directed    Call MD for:  severe uncontrolled pain   Complete by: As directed    Call MD for:  temperature >100.4   Complete by: As directed    Discharge wound care:   Complete by: As directed    Increase activity slowly   Complete by: As directed       Ms. Dokes,  It was a pleasure taking care of you in the hospital.   - You were admitted to the hospital for a COPD exacerbation. We treated you with steroids and an anti-inflammatory antibiotic, azithromycin.  - During this hospitalization, your heart went into a rhythm called atrial fibrillation (more information is attached). When the heart enters this rhythm, it can make you predisposed to develop a blot clot, which can lead to a stroke. In order to prevent this, we started you on a blood thinner called rivaroxaban (Xarelto). To control your heart rate, we started you on a beta blocker, metoprolol.  - We started you on a medication called mirtazapine (Remeron) to help with your mood and appetite. We discontinued your buspirone. - You received your Moderna vaccine #1 on 12/17/20. You will eligible for your Moderna vaccine #  2 on 01/14/21.  We would like you to follow-up in the  Internal Medicine Center in the next week. Someone will call you to schedule this appointment.  Take care!   Signed: Alexandria Lodge, MD 12/18/2020, 12:50 PM   Pager: [phone]

## 2020-12-24 ENCOUNTER — Telehealth: Payer: Self-pay

## 2020-12-24 NOTE — Telephone Encounter (Signed)
Returned call to patient. Sister, [name], answered and stated patient was sleeping. Requested call back when she wakes. Hubbard Hartshorn, BSN, RN-BC

## 2020-12-24 NOTE — Telephone Encounter (Signed)
Requesting to speak with a nurse about diarrhea, please call pt back.

## 2020-12-25 NOTE — Telephone Encounter (Signed)
Transition Care Management Follow-up Telephone Call  Date of discharge and from where: 12/18/20  How have you been since you were released from the hospital? "I had diarrhea yesterday, but it stopped and I am feeling great today".  Any questions or concerns? No  Items Reviewed:  Did the pt receive and understand the discharge instructions provided? Yes   Medications obtained and verified? Yes   Other? No   Any new allergies since your discharge? No   Dietary orders reviewed? Yes  Do you have support at home? Yes   Home Care and Equipment/Supplies: Were home health services ordered? yes If so, what is the name of the agency? [facility]  Has the agency set up a time to come to the patient's home? yes Were any new equipment or medical supplies ordered?   What is the name of the medical supply agency? "I don't know, but have everything I need.  My nurse comes out once a week".  Were you able to get the supplies/equipment? yes Do you have any questions related to the use of the equipment or supplies? No  Functional Questionnaire: (I = Independent and D = Dependent) ADLs: I  Bathing/Dressing- I  Meal Prep- I  Eating- I  Maintaining continence- I  Transferring/Ambulation- I  Managing Meds-I  Follow up appointments reviewed:   PCP Hospital f/u appt confirmed? Yes  Scheduled to see Katsadouros on 12/26/20 @ 1:45.  [facility] f/u appt confirmed? N/A   Are transportation arrangements needed? No   If their condition worsens, is the pt aware to call PCP or go to the Emergency Dept.? Yes  Was the patient provided with contact information for the PCP's office or ED? Yes  Was to pt encouraged to call back with questions or concerns? Yes

## 2020-12-25 NOTE — Addendum Note (Signed)
Addended by: Hulan Fray on: 12/25/2020 05:51 PM   Modules accepted: Orders

## 2020-12-25 NOTE — Telephone Encounter (Signed)
TC to patient to complete TOC call.  Patient states she is feeling great today, diarrhea stopped and denies any needs.   SChaplin, RN,BSN

## 2020-12-26 ENCOUNTER — Ambulatory Visit (INDEPENDENT_AMBULATORY_CARE_PROVIDER_SITE_OTHER): Payer: Medicare Other | Admitting: Student

## 2020-12-26 ENCOUNTER — Other Ambulatory Visit: Payer: Self-pay

## 2020-12-26 VITALS — BP 132/72 | HR 70 | Temp 98.1°F | Wt 97.5 lb

## 2020-12-26 DIAGNOSIS — Z0000 Encounter for general adult medical examination without abnormal findings: Secondary | ICD-10-CM

## 2020-12-26 DIAGNOSIS — I480 Paroxysmal atrial fibrillation: Secondary | ICD-10-CM

## 2020-12-26 DIAGNOSIS — J449 Chronic obstructive pulmonary disease, unspecified: Secondary | ICD-10-CM

## 2020-12-26 DIAGNOSIS — R110 Nausea: Secondary | ICD-10-CM

## 2020-12-26 DIAGNOSIS — E871 Hypo-osmolality and hyponatremia: Secondary | ICD-10-CM

## 2020-12-26 MED ORDER — ONDANSETRON HCL 4 MG PO TABS: 4.0000 mg | ORAL_TABLET | Freq: Every day | ORAL | 2 refills | Status: DC | PRN

## 2020-12-26 NOTE — Patient Instructions (Signed)
Thank you, Ms.Sherlynn Stalls for allowing [name] to provide your care today. Today we discussed:  [facility] Visit I am glad you are doing well since your hospital visit. Remember if you have any episodes of fluttering in your chest or feel as though your heart is racing, please go to your nearest emergency room!    I have ordered the following labs for you:   Lab Orders     BMP8+Anion Gap     CBC with Diff     Referrals ordered today:   Referral Orders  No referral(s) requested today     I have ordered the following medication/changed the following medications:   Stop the following medications: Medications Discontinued During This Encounter  Medication Reason  . ondansetron (ZOFRAN) 4 MG tablet Reorder     Start the following medications: Meds ordered this encounter  Medications  . ondansetron (ZOFRAN) 4 MG tablet    Sig: Take 1 tablet (4 mg total) by mouth daily as needed for nausea or vomiting.    Dispense:  30 tablet    Refill:  2     Follow up: 4 months    Remember: If you need [name] call us!  Should you have any questions or concerns please call the internal medicine clinic at [phone].     Sanjuana Letters, D.O. [place]

## 2020-12-27 ENCOUNTER — Telehealth: Payer: Self-pay | Admitting: Student

## 2020-12-27 DIAGNOSIS — Z0000 Encounter for general adult medical examination without abnormal findings: Secondary | ICD-10-CM | POA: Insufficient documentation

## 2020-12-27 LAB — CBC WITH DIFFERENTIAL/PLATELET
Basophils Absolute: 0.1 10*3/uL (ref 0.0–0.2)
Basos: 1 %
EOS (ABSOLUTE): 0.7 10*3/uL — ABNORMAL HIGH (ref 0.0–0.4)
Eos: 6 %
Hematocrit: 31.5 % — ABNORMAL LOW (ref 34.0–46.6)
Hemoglobin: 10.2 g/dL — ABNORMAL LOW (ref 11.1–15.9)
Immature Grans (Abs): 0.1 10*3/uL (ref 0.0–0.1)
Immature Granulocytes: 1 %
Lymphocytes Absolute: 2.3 10*3/uL (ref 0.7–3.1)
Lymphs: 22 %
MCH: 30.7 pg (ref 26.6–33.0)
MCHC: 32.4 g/dL (ref 31.5–35.7)
MCV: 95 fL (ref 79–97)
Monocytes Absolute: 0.7 10*3/uL (ref 0.1–0.9)
Monocytes: 7 %
Neutrophils Absolute: 6.9 10*3/uL (ref 1.4–7.0)
Neutrophils: 63 %
Platelets: 369 10*3/uL (ref 150–450)
RBC: 3.32 x10E6/uL — ABNORMAL LOW (ref 3.77–5.28)
RDW: 12.6 % (ref 11.7–15.4)
WBC: 10.6 10*3/uL (ref 3.4–10.8)

## 2020-12-27 LAB — BMP8+ANION GAP
Anion Gap: 13 mmol/L (ref 10.0–18.0)
BUN/Creatinine Ratio: 23 (ref 12–28)
BUN: 13 mg/dL (ref 8–27)
CO2: 27 mmol/L (ref 20–29)
Calcium: 9.1 mg/dL (ref 8.7–10.3)
Chloride: 93 mmol/L — ABNORMAL LOW (ref 96–106)
Creatinine, Ser: 0.56 mg/dL — ABNORMAL LOW (ref 0.57–1.00)
GFR calc Af Amer: 105 mL/min/{1.73_m2} (ref >59)
GFR calc non Af Amer: 92 mL/min/{1.73_m2} (ref >59)
Glucose: 87 mg/dL (ref 65–99)
Potassium: 4.5 mmol/L (ref 3.5–5.2)
Sodium: 133 mmol/L — ABNORMAL LOW (ref 134–144)

## 2020-12-27 NOTE — Telephone Encounter (Signed)
Kristen [name] with [name] wanted to make patient's pcp aware that she has declined physical therapy at this time due to covid exposure.

## 2020-12-27 NOTE — Assessment & Plan Note (Signed)
Assessment: Patient received 1st moderna covid-19 vaccine while admitted. Has next dose scheduled  Plan: -second dose vaccine for 01/14/21

## 2020-12-27 NOTE — Assessment & Plan Note (Signed)
Assessment: Patient recently admitted for COPD exacerbation. She has done well since her admission. Denies requiring more oxygen at home. Denies difficulties with ADL's since admission. She states she overall feels better since her admission  Plan: -continue proair 1-2 puffs q6h PRN -continue duoneb 20m q6h PRN -continue dulera 200-5, 1 puff BID

## 2020-12-27 NOTE — Assessment & Plan Note (Signed)
Assessment: While admitted, the patient had an episode of afib with RVR. She received adenosine and was then started on metoprolol. Since the admission, she denies episodes of chest pain, palpitations, or feeling as though her rate was racing. She was started on xarelto at discharge   Plan: -continue xarelto 15 mg daily, toprol xl 25 mg daily,

## 2020-12-27 NOTE — Assessment & Plan Note (Signed)
Assessment: Patient had resolved hyponatremia while admitted. Will repeat BMP today  Plan: -repeat BMP pending

## 2020-12-27 NOTE — Progress Notes (Signed)
CC: COPD Exacerbation, Afib  HPI:  [name] is a 75 y.o. female with a past medical history stated below and presents today for hospital follow up appointment after being admitted for COPD exacerbation and subsequently found to have afib with rvr. Please see problem based assessment and plan for additional details.  Past Medical History:  Diagnosis Date  . Cellulitis   . Chronic venous stasis dermatitis of both lower extremities    Chronic problem that was being manged by PCP in [place]  . COPD (chronic obstructive pulmonary disease) ([place])   . Dyspnea   . 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 acetaminophen (TYLENOL) 325 MG tablet Take 325 mg by mouth every 6 (six) hours as needed for mild pain or headache.     . albuterol (PROAIR HFA) 108 (90 Base) MCG/ACT inhaler Inhale 1-2 puffs into the lungs every 6 (six) hours as needed for wheezing or shortness of breath. 18 each 1  . feeding supplement (ENSURE ENLIVE / ENSURE PLUS) LIQD Take 237 mLs by mouth 3 (three) times daily between meals. (Patient taking differently: Take 1 Bottle by mouth 2 (two) times daily between meals.) 21330 mL 2  . fluticasone (FLONASE) 50 MCG/ACT nasal spray Place 1 spray into both nostrils daily as needed for allergies or rhinitis.    . INCRUSE ELLIPTA 62.5 MCG/INH AEPB INHALE 1 PUFF BY MOUTH EVERY DAY (Patient taking differently: Inhale 1 puff into the lungs daily.) 30 each 4  . ipratropium-albuterol (DUONEB) 0.5-2.5 (3) MG/3ML SOLN TAKE 3 MLS BY NEBULIZATION EVERY 6 (SIX) HOURS AS NEEDED. (Patient taking differently: Take 3 mLs by nebulization every 6 (six) hours as needed (shortness of breath/wheezing.).) 360 mL 1  . metoprolol succinate (TOPROL XL) 25 MG 24 hr tablet Take 2 tablets (50 mg total) by mouth daily. 60 tablet 0  . mirtazapine (REMERON) 15 MG tablet Take 1 tablet (15 mg total) by mouth at bedtime.  30 tablet 0  . mometasone-formoterol (DULERA) 200-5 MCG/ACT AERO Inhale 1 puff into the lungs 2 (two) times daily. 13 g 0  . Multiple Vitamin (MULTIVITAMIN WITH MINERALS) TABS tablet Take 1 tablet by mouth daily.    . nicotine (NICODERM CQ - DOSED IN MG/24 HOURS) 21 mg/24hr patch Place 1 patch (21 mg total) onto the skin daily. 28 patch 0  . oxyCODONE-acetaminophen (PERCOCET) 10-325 MG tablet Take 1 tablet by mouth every 8 (eight) hours. 90 tablet 0  . Rivaroxaban (XARELTO) 15 MG TABS tablet Take 1 tablet (15 mg total) by mouth daily. 30 tablet 0   No current facility-administered medications on file prior to visit.    Family History  Problem Relation Age of Onset  . Diabetes Mother   . Diabetes Mellitus II Mother   . Diabetes Father   . Diverticulitis Sister     Social History   Socioeconomic History  . Marital status: Divorced    Spouse name: Not on file  . Number of children: Not on file  . Years of education: Not on file  . Highest education level: Not on file  Occupational History    Comment: RETIRED  Tobacco Use  . Smoking status: Former Smoker    Packs/day: 0.25    Types: Cigarettes    Quit date: 10/16/2019    Years since quitting: 1.2  . Smokeless tobacco: Never Used  . Tobacco comment: 2-3 per day  Vaping Use  . Vaping Use: Never used  Substance and Sexual Activity  . Alcohol use: Yes    Comment: occasionally  . Drug use: Never  . Sexual activity: Not on file  Other Topics Concern  . Not on file  Social History Narrative  . Not on file   Social Determinants of Health   Financial Resource Strain: Not on file  Food Insecurity: Not on file  Transportation Needs: Not on file  Physical Activity: Not on file  Stress: Not on file  Social Connections: Not on file  Intimate Partner Violence: Not on file    Review of Systems: ROS negative except for what is noted on the assessment and plan.  Vitals:   12/26/20 1353  BP: 132/72  Pulse: 70  Temp: 98.1  F (36.7 C)  TempSrc: Oral  SpO2: 100%  Weight: 97 lb 8 oz (44.2 kg)     Physical Exam: Constitutional: thin, cachetic  HENT: normocephalic atraumatic, nasal cannula in place Eyes: conjunctiva non-erythematous Neck: supple Cardiovascular: regular rate and rhythm Pulmonary/Chest: normal work of breathing on supplemental O2 Abdominal: soft, non-tender, non-distended MSK: normal bulk and tone Neurological: alert & oriented x 3 Skin: warm and dry Psych: normal mood  Assessment & Plan:   See Encounters Tab for problem based charting.  Patient seen with Dr. Newell Coral, D.O. [facility], PGY-1 Pager: [phone], Phone: [phone] Date 12/27/2020 Time 8:17 AM

## 2020-12-28 ENCOUNTER — Other Ambulatory Visit: Payer: Self-pay | Admitting: Internal Medicine

## 2020-12-31 ENCOUNTER — Telehealth: Payer: Self-pay

## 2020-12-31 NOTE — Progress Notes (Signed)
Internal Medicine Clinic Attending  Case discussed with Dr. Johnney Ou  At the time of the visit.  We reviewed the resident's history and exam and pertinent patient test results.  I agree with the assessment, diagnosis, and plan of care documented in the resident's note.  I do note that her dose of Toprol XL is 25 mg 2 po daily rather than 1 po daily as written in narrative.  Consider changing to 50 mg XL daily in future to minimize pill burden if appropriate for goals.

## 2020-12-31 NOTE — Telephone Encounter (Signed)
Return call to [place], [place] stated pt refused OT and PT; they have been trying x 2 weeks. Pt told her she does not need it ; she's getting around just fine. I will inform her doctor.

## 2020-12-31 NOTE — Telephone Encounter (Signed)
Per Sharyn Lull pt is not wanting PT services pls contact [phone]

## 2020-12-31 NOTE — Telephone Encounter (Signed)
Thank you for letting us know.

## 2021-01-02 ENCOUNTER — Other Ambulatory Visit: Payer: Self-pay

## 2021-01-02 DIAGNOSIS — I872 Venous insufficiency (chronic) (peripheral): Secondary | ICD-10-CM

## 2021-01-02 MED ORDER — OXYCODONE-ACETAMINOPHEN 10-325 MG PO TABS
1.0000 | ORAL_TABLET | Freq: Three times a day (TID) | ORAL | 0 refills | Status: DC
Start: 2021-01-02 — End: 2021-01-28

## 2021-01-02 NOTE — Telephone Encounter (Signed)
Pt is requesting her oxyCODONE-acetaminophen (PERCOCET) 10-325 MG tablet sent to  [id number] [address] Phone:  [phone] Fax:  [phone]     ( pt stated that all the rest of her medication  Were sent but not here pain meds )

## 2021-01-02 NOTE — Telephone Encounter (Signed)
Last rx written  12/04/20 Last OV  12/26/20 Next OV  Has not been scheduled.

## 2021-01-08 ENCOUNTER — Telehealth (HOSPITAL_COMMUNITY): Payer: Self-pay

## 2021-01-08 DIAGNOSIS — I480 Paroxysmal atrial fibrillation: Secondary | ICD-10-CM

## 2021-01-08 MED ORDER — RIVAROXABAN 15 MG PO TABS: 15.0000 mg | ORAL_TABLET | Freq: Every day | ORAL | 0 refills | Status: DC

## 2021-01-08 NOTE — Telephone Encounter (Signed)
Patient is doing well since discharge. States both the pharmacist and the doctor thoroughly went over her Xarelto while in the hospital so she didn't need to go over it again. She did have a follow up with her PCP last week but she wasn't sure any refills were went to her home pharmacy at [facility] on [address]. Will message PCP to make sure she has refills on Xarelto for next month.

## 2021-01-08 NOTE — Addendum Note (Signed)
Addended bySanjuan Dame on: 01/08/2021 12:11 PM   Modules accepted: Orders

## 2021-01-11 ENCOUNTER — Other Ambulatory Visit: Payer: Self-pay | Admitting: Internal Medicine

## 2021-01-11 DIAGNOSIS — J449 Chronic obstructive pulmonary disease, unspecified: Secondary | ICD-10-CM

## 2021-01-15 ENCOUNTER — Encounter (HOSPITAL_COMMUNITY): Payer: Self-pay

## 2021-01-15 ENCOUNTER — Emergency Department (HOSPITAL_COMMUNITY)
Admission: EM | Admit: 2021-01-15 | Discharge: 2021-01-16 | Disposition: A | Discharge status: Home / Self Care | Payer: Medicare Other | Attending: Emergency Medicine | Admitting: Emergency Medicine

## 2021-01-15 ENCOUNTER — Emergency Department (HOSPITAL_COMMUNITY): Payer: Medicare Other

## 2021-01-15 DIAGNOSIS — Z79899 Other long term (current) drug therapy: Secondary | ICD-10-CM | POA: Diagnosis not present

## 2021-01-15 DIAGNOSIS — R1084 Generalized abdominal pain: Secondary | ICD-10-CM

## 2021-01-15 DIAGNOSIS — Z87891 Personal history of nicotine dependence: Secondary | ICD-10-CM | POA: Insufficient documentation

## 2021-01-15 DIAGNOSIS — J449 Chronic obstructive pulmonary disease, unspecified: Secondary | ICD-10-CM | POA: Diagnosis not present

## 2021-01-15 LAB — COMPREHENSIVE METABOLIC PANEL
ALT: 20 U/L (ref 0–44)
AST: 56 U/L — ABNORMAL HIGH (ref 15–41)
Albumin: 3.5 g/dL (ref 3.5–5.0)
Alkaline Phosphatase: 114 U/L (ref 38–126)
Anion gap: 9 (ref 5–15)
BUN: 20 mg/dL (ref 8–23)
CO2: 29 mmol/L (ref 22–32)
Calcium: 8.9 mg/dL (ref 8.9–10.3)
Chloride: 92 mmol/L — ABNORMAL LOW (ref 98–111)
Creatinine, Ser: 0.62 mg/dL (ref 0.44–1.00)
GFR, Estimated: >60 mL/min (ref >60)
Glucose, Bld: 106 mg/dL — ABNORMAL HIGH (ref 70–99)
Potassium: 4.1 mmol/L (ref 3.5–5.1)
Sodium: 130 mmol/L — ABNORMAL LOW (ref 135–145)
Total Bilirubin: 0.5 mg/dL (ref 0.3–1.2)
Total Protein: 6.5 g/dL (ref 6.5–8.1)

## 2021-01-15 LAB — CBC WITH DIFFERENTIAL/PLATELET
Abs Immature Granulocytes: 0.02 10*3/uL (ref 0.00–0.07)
Basophils Absolute: 0 10*3/uL (ref 0.0–0.1)
Basophils Relative: 1 %
Eosinophils Absolute: 0.1 10*3/uL (ref 0.0–0.5)
Eosinophils Relative: 2 %
HCT: 34.7 % — ABNORMAL LOW (ref 36.0–46.0)
Hemoglobin: 11.2 g/dL — ABNORMAL LOW (ref 12.0–15.0)
Immature Granulocytes: 0 %
Lymphocytes Relative: 21 %
Lymphs Abs: 1.3 10*3/uL (ref 0.7–4.0)
MCH: 31.1 pg (ref 26.0–34.0)
MCHC: 32.3 g/dL (ref 30.0–36.0)
MCV: 96.4 fL (ref 80.0–100.0)
Monocytes Absolute: 0.5 10*3/uL (ref 0.1–1.0)
Monocytes Relative: 9 %
Neutro Abs: 4.3 10*3/uL (ref 1.7–7.7)
Neutrophils Relative %: 67 %
Platelets: 277 10*3/uL (ref 150–400)
RBC: 3.6 MIL/uL — ABNORMAL LOW (ref 3.87–5.11)
RDW: 12.6 % (ref 11.5–15.5)
WBC: 6.2 10*3/uL (ref 4.0–10.5)
nRBC: 0 % (ref 0.0–0.2)

## 2021-01-15 LAB — LIPASE, BLOOD: Lipase: 25 U/L (ref 11–51)

## 2021-01-15 MED ORDER — ONDANSETRON HCL 4 MG/2ML IJ SOLN
4.0000 mg | Freq: Once | INTRAMUSCULAR | Status: AC
  Administered 2021-01-15: 4 mg via INTRAVENOUS
  Filled 2021-01-15: qty 2

## 2021-01-15 MED ORDER — SODIUM CHLORIDE 0.9 % IV BOLUS
500.0000 mL | Freq: Once | INTRAVENOUS | Status: AC
  Administered 2021-01-15: 500 mL via INTRAVENOUS

## 2021-01-15 MED ORDER — MORPHINE SULFATE (PF) 4 MG/ML IV SOLN
4.0000 mg | Freq: Once | INTRAVENOUS | Status: AC
  Administered 2021-01-15: 4 mg via INTRAVENOUS
  Filled 2021-01-15: qty 1

## 2021-01-15 MED ORDER — IOHEXOL 300 MG/ML  SOLN
100.0000 mL | Freq: Once | INTRAMUSCULAR | Status: AC | PRN
  Administered 2021-01-15: 100 mL via INTRAVENOUS

## 2021-01-15 NOTE — ED Provider Notes (Incomplete)
[facility] DEPT Provider Note   CSN: [id number] Arrival date & time: 01/15/21  1755     History Chief Complaint  Patient presents with  . Abdominal Pain    Kristen [name] is a 75 y.o. female.  The history is provided by the patient and medical records. No language interpreter was used.  Abdominal Pain    75 year old female significant history of COPD, malnutrition, paroxysmal atrial fibrillation on Xarelto brought here via EMS from home for evaluation of abdominal pain.  Patient reports she has been doing well lately eating and drinking fine however this afternoon she developed an acute onset of pain to her upper abdomen.  She described pain as a spasm stabbing sensation to upper abdomen shooting towards her mid chest, with associated nausea but no vomiting.  Pain is very intense, 10 out of 10, which did improve mildly to 8 out of 10 but still present.  No associated fever chills no lightheadedness or dizziness no chest pain or shortness of breath no cough no dysuria no diarrhea or constipation.  No specific treatment tried.  No recent sick contact.  Patient does wear oxygen at home at 3 to 4 L at baseline.  She did have a CBG obtained by EMS and it was 137.  Her pain started roughly about an hour ago.  Past Medical History:  Diagnosis Date  . Cellulitis   . Chronic venous stasis dermatitis of both lower extremities    Chronic problem that was being manged by PCP in [place]  . COPD (chronic obstructive pulmonary disease) ([place])   . Dyspnea   . 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 Health care maintenance 12/27/2020  . Paroxysmal atrial fibrillation with rapid ventricular response ([place]) 12/16/2020  . Nausea 11/07/2020  . Acute on chronic respiratory failure with hypoxia and hypercapnia ([place]) 10/22/2020  . Lower extremity ulceration ([place]) 10/04/2020  . Protein-calorie malnutrition, severe  09/26/2020  . Generalized anxiety disorder 08/27/2020  . 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 Smoking 07/18/2020  . Venous insufficiency of both lower extremities 07/18/2020  . COPD (chronic obstructive pulmonary disease) ([place]) 06/28/2020  . Protein-calorie malnutrition ([place]) 06/28/2020  . Hyponatremia 06/28/2020    Past Surgical History:  Procedure Laterality Date  . VASCULAR SURGERY       OB History   No obstetric history on file.     Family History  Problem Relation Age of Onset  . Diabetes Mother   . Diabetes Mellitus II Mother   . Diabetes Father   . Diverticulitis Sister     Social History   Tobacco Use  . Smoking status: Former Smoker    Packs/day: 0.25    Types: Cigarettes    Quit date: 10/16/2019    Years since quitting: 1.2  . Smokeless tobacco: Never Used  . Tobacco comment: 2-3 per day   Vaping Use  . Vaping Use: Never used  Substance Use Topics  . Alcohol use: Yes    Comment: occasionally  . Drug use: Never    Home Medications Prior to Admission medications   Medication Sig Start Date End Date Taking? Authorizing Provider  acetaminophen (TYLENOL) 325 MG tablet Take 325 mg by mouth every 6 (six) hours as needed for mild pain or headache.     [provider]  albuterol (VENTOLIN HFA) 108 (90 Base) MCG/ACT inhaler INHALE 1-2 PUFFS BY MOUTH EVERY  6 HOURS AS NEEDED FOR WHEEZE OR SHORTNESS OF BREATH 01/13/21   Agyei, Caprice Kluver, MD  DULERA 200-5 MCG/ACT AERO INHALE 1 PUFF INTO THE LUNGS TWICE A DAY 01/13/21   Agyei, Caprice Kluver, MD  fluticasone (FLONASE) 50 MCG/ACT nasal spray Place 1 spray into both nostrils daily as needed for allergies or rhinitis.    [provider]  INCRUSE ELLIPTA 62.5 MCG/INH AEPB INHALE 1 PUFF BY MOUTH EVERY DAY Patient taking differently: Inhale 1 puff into the lungs daily. 11/27/20   Jeralyn Bennett, MD  ipratropium-albuterol (DUONEB) 0.5-2.5 (3) MG/3ML SOLN Take 3 mLs by nebulization every 6 (six) hours as needed  (shortness of breath/wheezing.). 12/30/20 03/30/21  Jose Persia, MD  metoprolol succinate (TOPROL XL) 25 MG 24 hr tablet Take 2 tablets (50 mg total) by mouth daily. 12/18/20 12/18/21  Alexandria Lodge, MD  mirtazapine (REMERON) 15 MG tablet Take 1 tablet (15 mg total) by mouth at bedtime. 12/18/20   Alexandria Lodge, MD  Multiple Vitamin (MULTIVITAMIN WITH MINERALS) TABS tablet Take 1 tablet by mouth daily.    [provider]  nicotine (NICODERM CQ - DOSED IN MG/24 HOURS) 21 mg/24hr patch Place 1 patch (21 mg total) onto the skin daily. 10/24/20   Lavina Hamman, MD  ondansetron (ZOFRAN) 4 MG tablet Take 1 tablet (4 mg total) by mouth daily as needed for nausea or vomiting. 12/26/20   Riesa Pope, MD  oxyCODONE-acetaminophen (PERCOCET) 10-325 MG tablet Take 1 tablet by mouth every 8 (eight) hours. 01/02/21 02/01/21  Jose Persia, MD  Rivaroxaban (XARELTO) 15 MG TABS tablet Take 1 tablet (15 mg total) by mouth daily. 01/08/21 02/07/21  [name], MD    Allergies    Gabapentin  Review of Systems   Review of Systems  Gastrointestinal: Positive for abdominal pain.  All other systems reviewed and are negative.   Physical Exam Updated Vital Signs BP 135/63   Pulse 62   Temp 98 F (36.7 C) (Oral)   Resp 17   SpO2 100%   Physical Exam Vitals and nursing note reviewed.  Constitutional:      General: She is not in acute distress.    Appearance: She is well-nourished.     Comments: Cachectic appearing elderly female appears to be in no acute discomfort.  She is wearing supplemental oxygen.  HENT:     Head: Atraumatic.  Eyes:     Conjunctiva/sclera: Conjunctivae normal.  Cardiovascular:     Rate and Rhythm: Normal rate and regular rhythm.     Heart sounds: Normal heart sounds.  Pulmonary:     Effort: Pulmonary effort is normal.     Breath sounds: Normal breath sounds. No wheezing.  Abdominal:     General: Abdomen is flat. Bowel sounds are normal.     Tenderness:  There is abdominal tenderness in the epigastric area. There is no guarding or rebound.  Musculoskeletal:     Cervical back: Neck supple.  Skin:    Findings: No rash.  Neurological:     Mental Status: She is alert and oriented to person, place, and time.  Psychiatric:        Mood and Affect: Mood and affect and mood normal.     ED Results / Procedures / Treatments   Labs (all labs ordered are listed, but only abnormal results are displayed) Labs Reviewed  CBC WITH DIFFERENTIAL/PLATELET - Abnormal; Notable for the following components:      Result Value   RBC 3.60 (*)  Hemoglobin 11.2 (*)    HCT 34.7 (*)    All other components within normal limits  COMPREHENSIVE METABOLIC PANEL - Abnormal; Notable for the following components:   Sodium 130 (*)    Chloride 92 (*)    Glucose, Bld 106 (*)    AST 56 (*)    All other components within normal limits  LIPASE, BLOOD  URINALYSIS, ROUTINE W REFLEX MICROSCOPIC    EKG EKG Interpretation  Date/Time:  Wednesday January 15 2021 20:00:46 EST Ventricular Rate:  63 PR Interval:    QRS Duration: 102 QT Interval:  418 QTC Calculation: 428 R Axis:   80 Text Interpretation: Sinus rhythm Minimal ST elevation, inferior leads Since prior ECG< pt now in sinus rhythm Confirmed by Gareth Morgan [phone]) on 01/15/2021 9:47:26 PM   Radiology CT ABDOMEN PELVIS W CONTRAST  Result Date: 01/15/2021 CLINICAL DATA:  Left upper quadrant abdominal pain EXAM: CT ABDOMEN AND PELVIS WITH CONTRAST TECHNIQUE: Multidetector CT imaging of the abdomen and pelvis was performed using the standard protocol following bolus administration of intravenous contrast. CONTRAST:  117m OMNIPAQUE IOHEXOL 300 MG/ML  SOLN COMPARISON:  None. FINDINGS: Lower chest: No acute pleural or parenchymal lung disease. Hepatobiliary: No focal liver abnormality is seen. No gallstones, gallbladder wall thickening, or biliary dilatation. Pancreas: Unremarkable. No pancreatic ductal  dilatation or surrounding inflammatory changes. Spleen: Normal in size without focal abnormality. Adrenals/Urinary Tract: Mild bilateral renal cortical atrophy. Simple cyst right kidney. No urinary tract calculi or obstructive uropathy. The adrenals are unremarkable. Bladder is distended with multiple trabeculations, compatible with chronic bladder outlet obstruction. No filling defects. Stomach/Bowel: No bowel obstruction or ileus. Moderate stool throughout the colon. No bowel wall thickening or inflammatory change. Vascular/Lymphatic: Aortic atherosclerosis. No enlarged abdominal or pelvic lymph nodes. Reproductive: The uterus is atrophic.  No adnexal masses. Other: No free fluid or free gas.  No abdominal wall hernia. Musculoskeletal: No acute or destructive bony lesions. Reconstructed images demonstrate no additional findings. IMPRESSION: 1. Distended urinary bladder with multiple trabeculations, consistent with chronic bladder outlet obstruction or neurogenic bladder. 2. Moderate fecal retention. 3.  Aortic Atherosclerosis (ICD10-I70.0). Electronically Signed   By: MRanda NgoM.D.   On: 01/15/2021 21:40    Procedures Procedures {Remember to document critical care time when appropriate:1}  Medications Ordered in ED Medications  morphine 4 MG/ML injection 4 mg (4 mg Intravenous Given 01/15/21 1827)  ondansetron (ZOFRAN) injection 4 mg (4 mg Intravenous Given 01/15/21 1826)  sodium chloride 0.9 % bolus 500 mL (0 mLs Intravenous Stopped 01/15/21 1911)  iohexol (OMNIPAQUE) 300 MG/ML solution 100 mL (100 mLs Intravenous Contrast Given 01/15/21 2109)    ED Course  I have reviewed the triage vital signs and the nursing notes.  Pertinent labs & imaging results that were available during my care of the patient were reviewed by me and considered in my medical decision making (see chart for details).    MDM Rules/Calculators/A&P                          BP 133/74   Pulse 60   Temp 98 F (36.7 C)  (Oral)   Resp 20   SpO2 98%   Final Clinical Impression(s) / ED Diagnoses Final diagnoses:  None    Rx / DC Orders ED Discharge Orders    None     6:25 PM Patient here with acute onset of upper abdominal pain started approximately an hour ago.  Reports some  spasm associated with abdominal pain.  Does endorse some nausea no vomiting.  The symptom is suggestive of esophageal spasm but given her age, will obtain abdominal pelvis CT scan to rule out acute abdominal pathology.  Will provide symptomatic treatment.  7:28 PM On reassessment patient appears much more comfortable resting comfortably and reported improvement of his symptoms after receiving treatment.  Labs are mostly reassuring.  Mild hyponatremia with a sodium of 130.  She is currently receiving IV fluid.  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  Low suspicion for dissection causing sxs.  Care discussed with Dr. Billy Fischer.   11:51 PM Patient is resting comfortably.  She was able to make urine.  Will check UA.  11:56 PM CT of the abd/pelvis showing distended urinary bladder with multiple trabeculations consistent with chronic bladder outlet obstruction.  Also evidence of moderate fecal retention.  No report of constipation.  UA is currently pending.  Pt sign out to oncoming provider who will f/u on UA result, if negative and pt feels better she can be discharge home with outpt f/u.

## 2021-01-15 NOTE — ED Triage Notes (Signed)
Pt BIB EMS Pt lives at home with her sister. Abdominal pain for a half an hr, on and off with waves after using the bathroom. 10/10 shooting and stabbing pain in the mid abdomen, spasms felt upon palpation per EMS. Nothing helps with pain. No N/V. Pt is ambulatory and continent.   Vitals  126/80 100 18 SPO2 98 oxygen 3-4 L per baseline CBG 137

## 2021-01-15 NOTE — ED Provider Notes (Signed)
[facility] DEPT Provider Note   CSN: [id number] Arrival date & time: 01/15/21  1755     History Chief Complaint  Patient presents with  . Abdominal Pain    Ladonne Sharples is a 75 y.o. female.  The history is provided by the patient and medical records. No language interpreter was used.  Abdominal Pain    75 year old female significant history of COPD, malnutrition, paroxysmal atrial fibrillation on Xarelto brought here via EMS from home for evaluation of abdominal pain.  Patient reports she has been doing well lately eating and drinking fine however this afternoon she developed an acute onset of pain to her upper abdomen.  She described pain as a spasm stabbing sensation to upper abdomen shooting towards her mid chest, with associated nausea but no vomiting.  Pain is very intense, 10 out of 10, which did improve mildly to 8 out of 10 but still present.  No associated fever chills no lightheadedness or dizziness no chest pain or shortness of breath no cough no dysuria no diarrhea or constipation.  No specific treatment tried.  No recent sick contact.  Patient does wear oxygen at home at 3 to 4 L at baseline.  She did have a CBG obtained by EMS and it was 137.  Her pain started roughly about an hour ago.  Past Medical History:  Diagnosis Date  . Cellulitis   . Chronic venous stasis dermatitis of both lower extremities    Chronic problem that was being manged by PCP in [place]  . COPD (chronic obstructive pulmonary disease) ([place])   . Dyspnea   . 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 Health care maintenance 12/27/2020  . Paroxysmal atrial fibrillation with rapid ventricular response ([place]) 12/16/2020  . Nausea 11/07/2020  . Acute on chronic respiratory failure with hypoxia and hypercapnia ([place]) 10/22/2020  . Lower extremity ulceration ([place]) 10/04/2020  . Protein-calorie malnutrition, severe  09/26/2020  . Generalized anxiety disorder 08/27/2020  . 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 Smoking 07/18/2020  . Venous insufficiency of both lower extremities 07/18/2020  . COPD (chronic obstructive pulmonary disease) ([place]) 06/28/2020  . Protein-calorie malnutrition ([place]) 06/28/2020  . Hyponatremia 06/28/2020    Past Surgical History:  Procedure Laterality Date  . VASCULAR SURGERY       OB History   No obstetric history on file.     Family History  Problem Relation Age of Onset  . Diabetes Mother   . Diabetes Mellitus II Mother   . Diabetes Father   . Diverticulitis Sister     Social History   Tobacco Use  . Smoking status: Former Smoker    Packs/day: 0.25    Types: Cigarettes    Quit date: 10/16/2019    Years since quitting: 1.2  . Smokeless tobacco: Never Used  . Tobacco comment: 2-3 per day   Vaping Use  . Vaping Use: Never used  Substance Use Topics  . Alcohol use: Yes    Comment: occasionally  . Drug use: Never    Home Medications Prior to Admission medications   Medication Sig Start Date End Date Taking? Authorizing Provider  acetaminophen (TYLENOL) 325 MG tablet Take 325 mg by mouth every 6 (six) hours as needed for mild pain or headache.     [provider]  albuterol (VENTOLIN HFA) 108 (90 Base) MCG/ACT inhaler INHALE 1-2 PUFFS BY MOUTH EVERY  6 HOURS AS NEEDED FOR WHEEZE OR SHORTNESS OF BREATH 01/13/21   Agyei, Caprice Kluver, MD  DULERA 200-5 MCG/ACT AERO INHALE 1 PUFF INTO THE LUNGS TWICE A DAY 01/13/21   Agyei, Caprice Kluver, MD  fluticasone (FLONASE) 50 MCG/ACT nasal spray Place 1 spray into both nostrils daily as needed for allergies or rhinitis.    [provider]  INCRUSE ELLIPTA 62.5 MCG/INH AEPB INHALE 1 PUFF BY MOUTH EVERY DAY Patient taking differently: Inhale 1 puff into the lungs daily. 11/27/20   Jeralyn Bennett, MD  ipratropium-albuterol (DUONEB) 0.5-2.5 (3) MG/3ML SOLN Take 3 mLs by nebulization every 6 (six) hours as needed  (shortness of breath/wheezing.). 12/30/20 03/30/21  Jose Persia, MD  metoprolol succinate (TOPROL XL) 25 MG 24 hr tablet Take 2 tablets (50 mg total) by mouth daily. 12/18/20 12/18/21  Alexandria Lodge, MD  mirtazapine (REMERON) 15 MG tablet Take 1 tablet (15 mg total) by mouth at bedtime. 12/18/20   Alexandria Lodge, MD  Multiple Vitamin (MULTIVITAMIN WITH MINERALS) TABS tablet Take 1 tablet by mouth daily.    [provider]  nicotine (NICODERM CQ - DOSED IN MG/24 HOURS) 21 mg/24hr patch Place 1 patch (21 mg total) onto the skin daily. 10/24/20   Lavina Hamman, MD  ondansetron (ZOFRAN) 4 MG tablet Take 1 tablet (4 mg total) by mouth daily as needed for nausea or vomiting. 12/26/20   Riesa Pope, MD  oxyCODONE-acetaminophen (PERCOCET) 10-325 MG tablet Take 1 tablet by mouth every 8 (eight) hours. 01/02/21 02/01/21  Jose Persia, MD  Rivaroxaban (XARELTO) 15 MG TABS tablet Take 1 tablet (15 mg total) by mouth daily. 01/08/21 02/07/21  [name], MD    Allergies    Gabapentin  Review of Systems   Review of Systems  Gastrointestinal: Positive for abdominal pain.  All other systems reviewed and are negative.   Physical Exam Updated Vital Signs BP 135/63   Pulse 62   Temp 98 F (36.7 C) (Oral)   Resp 17   SpO2 100%   Physical Exam Vitals and nursing note reviewed.  Constitutional:      General: She is not in acute distress.    Appearance: She is well-nourished.     Comments: Cachectic appearing elderly female appears to be in no acute discomfort.  She is wearing supplemental oxygen.  HENT:     Head: Atraumatic.  Eyes:     Conjunctiva/sclera: Conjunctivae normal.  Cardiovascular:     Rate and Rhythm: Normal rate and regular rhythm.     Heart sounds: Normal heart sounds.  Pulmonary:     Effort: Pulmonary effort is normal.     Breath sounds: Normal breath sounds. No wheezing.  Abdominal:     General: Abdomen is flat. Bowel sounds are normal.     Tenderness:  There is abdominal tenderness in the epigastric area. There is no guarding or rebound.  Musculoskeletal:     Cervical back: Neck supple.  Skin:    Findings: No rash.  Neurological:     Mental Status: She is alert and oriented to person, place, and time.  Psychiatric:        Mood and Affect: Mood and affect and mood normal.     ED Results / Procedures / Treatments   Labs (all labs ordered are listed, but only abnormal results are displayed) Labs Reviewed  CBC WITH DIFFERENTIAL/PLATELET - Abnormal; Notable for the following components:      Result Value   RBC 3.60 (*)  Hemoglobin 11.2 (*)    HCT 34.7 (*)    All other components within normal limits  COMPREHENSIVE METABOLIC PANEL - Abnormal; Notable for the following components:   Sodium 130 (*)    Chloride 92 (*)    Glucose, Bld 106 (*)    AST 56 (*)    All other components within normal limits  LIPASE, BLOOD  URINALYSIS, ROUTINE W REFLEX MICROSCOPIC    EKG EKG Interpretation  Date/Time:  Wednesday January 15 2021 20:00:46 EST Ventricular Rate:  63 PR Interval:    QRS Duration: 102 QT Interval:  418 QTC Calculation: 428 R Axis:   80 Text Interpretation: Sinus rhythm Minimal ST elevation, inferior leads Since prior ECG< pt now in sinus rhythm Confirmed by Gareth Morgan [phone]) on 01/15/2021 9:47:26 PM   Radiology CT ABDOMEN PELVIS W CONTRAST  Result Date: 01/15/2021 CLINICAL DATA:  Left upper quadrant abdominal pain EXAM: CT ABDOMEN AND PELVIS WITH CONTRAST TECHNIQUE: Multidetector CT imaging of the abdomen and pelvis was performed using the standard protocol following bolus administration of intravenous contrast. CONTRAST:  169m OMNIPAQUE IOHEXOL 300 MG/ML  SOLN COMPARISON:  None. FINDINGS: Lower chest: No acute pleural or parenchymal lung disease. Hepatobiliary: No focal liver abnormality is seen. No gallstones, gallbladder wall thickening, or biliary dilatation. Pancreas: Unremarkable. No pancreatic ductal  dilatation or surrounding inflammatory changes. Spleen: Normal in size without focal abnormality. Adrenals/Urinary Tract: Mild bilateral renal cortical atrophy. Simple cyst right kidney. No urinary tract calculi or obstructive uropathy. The adrenals are unremarkable. Bladder is distended with multiple trabeculations, compatible with chronic bladder outlet obstruction. No filling defects. Stomach/Bowel: No bowel obstruction or ileus. Moderate stool throughout the colon. No bowel wall thickening or inflammatory change. Vascular/Lymphatic: Aortic atherosclerosis. No enlarged abdominal or pelvic lymph nodes. Reproductive: The uterus is atrophic.  No adnexal masses. Other: No free fluid or free gas.  No abdominal wall hernia. Musculoskeletal: No acute or destructive bony lesions. Reconstructed images demonstrate no additional findings. IMPRESSION: 1. Distended urinary bladder with multiple trabeculations, consistent with chronic bladder outlet obstruction or neurogenic bladder. 2. Moderate fecal retention. 3.  Aortic Atherosclerosis (ICD10-I70.0). Electronically Signed   By: MRanda NgoM.D.   On: 01/15/2021 21:40    Procedures Procedures   Medications Ordered in ED Medications  morphine 4 MG/ML injection 4 mg (4 mg Intravenous Given 01/15/21 1827)  ondansetron (ZOFRAN) injection 4 mg (4 mg Intravenous Given 01/15/21 1826)  sodium chloride 0.9 % bolus 500 mL (0 mLs Intravenous Stopped 01/15/21 1911)  iohexol (OMNIPAQUE) 300 MG/ML solution 100 mL (100 mLs Intravenous Contrast Given 01/15/21 2109)    ED Course  I have reviewed the triage vital signs and the nursing notes.  Pertinent labs & imaging results that were available during my care of the patient were reviewed by me and considered in my medical decision making (see chart for details).    MDM Rules/Calculators/A&P                          BP 133/74   Pulse 60   Temp 98 F (36.7 C) (Oral)   Resp 20   SpO2 98%   Final Clinical  Impression(s) / ED Diagnoses Final diagnoses:  Generalized abdominal pain    Rx / DC Orders ED Discharge Orders    None     6:25 PM Patient here with acute onset of upper abdominal pain started approximately an hour ago.  Reports some spasm associated with abdominal pain.  Does endorse some nausea no vomiting.  The symptom is suggestive of esophageal spasm but given her age, will obtain abdominal pelvis CT scan to rule out acute abdominal pathology.  Will provide symptomatic treatment.  7:28 PM On reassessment patient appears much more comfortable resting comfortably and reported improvement of his symptoms after receiving treatment.  Labs are mostly reassuring.  Mild hyponatremia with a sodium of 130.  She is currently receiving IV fluid.  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  Low suspicion for dissection causing sxs.  Care discussed with Dr. Billy Fischer.   11:51 PM Patient is resting comfortably.  She was able to make urine.  Will check UA.  11:56 PM CT of the abd/pelvis showing distended urinary bladder with multiple trabeculations consistent with chronic bladder outlet obstruction.  Also evidence of moderate fecal retention.  No report of constipation.  UA is currently pending.  Pt sign out to oncoming provider who will f/u on UA result, if negative and pt feels better she can be discharge home with outpt f/u.    12:05 AM UA without evidence of UTI or any acute changes.  On reassessment, no abdominal tenderness, no chest pain. Doubt ACS or dissection. Patient resting comfortably.  At this time no acute findings noted.  Patient will be discharged home to follow-up outpatient.  Return precaution discussed.   Domenic Moras, PA-C 01/16/21 0009    Gareth Morgan, MD 01/16/21 1537

## 2021-01-16 DIAGNOSIS — R1084 Generalized abdominal pain: Secondary | ICD-10-CM | POA: Diagnosis not present

## 2021-01-16 LAB — URINALYSIS, ROUTINE W REFLEX MICROSCOPIC
Bilirubin Urine: NEGATIVE
Glucose, UA: NEGATIVE mg/dL
Hgb urine dipstick: NEGATIVE
Ketones, ur: NEGATIVE mg/dL
Leukocytes,Ua: NEGATIVE
Nitrite: NEGATIVE
Protein, ur: NEGATIVE mg/dL
Specific Gravity, Urine: 1.019 (ref 1.005–1.030)
pH: 7 (ref 5.0–8.0)

## 2021-01-16 MED ORDER — MORPHINE SULFATE (PF) 4 MG/ML IV SOLN
4.0000 mg | Freq: Once | INTRAVENOUS | Status: AC
  Administered 2021-01-16: 4 mg via INTRAVENOUS
  Filled 2021-01-16: qty 1

## 2021-01-16 NOTE — ED Notes (Signed)
PTAR here to take pt home. Pt A&O x4, dressed pt in clothes from home, VS WNL, pt states someone is at home to meet her, gave dc papers

## 2021-01-16 NOTE — Discharge Instructions (Addendum)
You have been evaluated for your abdominal pain.  Fortunately your CT scan did not show any concerning finding.  Your labs are reassuring.  Please call and follow-up closely with your primary care doctor for further care.  Return if your symptoms worsen or if you have any other concern.

## 2021-01-20 ENCOUNTER — Telehealth: Payer: Self-pay | Admitting: *Deleted

## 2021-01-20 NOTE — Telephone Encounter (Signed)
Call from [facility] [place] - concern with pt's ongoing abd pain. Pt was seen in ED on 2/23 - CT was normal. But Glenard Haring states pt continues c/o nausea and epigastric pain mainly after eating. Pain level yesterday was #2; today #6. Not eating; weight loss. Having regular BM's. Glenard Haring stated she getting worse;suggested GI referral. Do you want [name] to schedule an in- person or telehealth appt.?

## 2021-01-20 NOTE — Telephone Encounter (Signed)
It appears when he went to the ED, there was concern for possible esophageal spasm.  Etiology is unclear and he would benefit more from in person assessment

## 2021-01-21 NOTE — Telephone Encounter (Signed)
I called Kristen [name] about the nurse's concern of ongoing adb pain and nausea. Pt stated she just "had an episode" , she "went to the ED, had CT which was normal". Stated feels alright. Stated she needs to make f/u appt but she did not want to do it at this time b/c she has to be sure she has ride and she will call us back.

## 2021-01-21 NOTE — Telephone Encounter (Signed)
Sounds good. Thank you for the update.

## 2021-01-27 NOTE — Telephone Encounter (Signed)
Glenard Haring, RN with [name] called requesting VO for one more visit to d/c patient. States pt has met all goals of care. Angel recommended she f/u with Dr. Dellia Nims [facility] for chronic venous stasis ulcers on feet. Patient refused. Explained to [facility] that patient was notified to schedule in person appt to f/u on epigastric pain. She will encourage patient to do so tomorrow.

## 2021-01-28 ENCOUNTER — Other Ambulatory Visit: Payer: Self-pay | Admitting: Student

## 2021-01-28 ENCOUNTER — Other Ambulatory Visit: Payer: Self-pay

## 2021-01-28 DIAGNOSIS — I872 Venous insufficiency (chronic) (peripheral): Secondary | ICD-10-CM

## 2021-01-28 MED ORDER — OXYCODONE-ACETAMINOPHEN 10-325 MG PO TABS
1.0000 | ORAL_TABLET | Freq: Three times a day (TID) | ORAL | 0 refills | Status: DC
Start: 2021-01-31 — End: 2021-02-24

## 2021-01-28 NOTE — Telephone Encounter (Signed)
Last rx written 01/02/21 Last OV  12/26/20. Next OV   02/07/20. UDS  Has not been done.

## 2021-01-28 NOTE — Telephone Encounter (Signed)
Pt is requesting her oxyCODONE-acetaminophen (PERCOCET) 10-325 MG tablet  ( pt needs a prior auth in order to get her medicine )

## 2021-01-29 ENCOUNTER — Other Ambulatory Visit: Payer: Self-pay

## 2021-01-29 DIAGNOSIS — I480 Paroxysmal atrial fibrillation: Secondary | ICD-10-CM

## 2021-01-29 MED ORDER — METOPROLOL SUCCINATE ER 25 MG PO TB24: 50.0000 mg | ORAL_TABLET | Freq: Every day | ORAL | 0 refills | Status: DC

## 2021-01-29 MED ORDER — MIRTAZAPINE 15 MG PO TABS: 15.0000 mg | ORAL_TABLET | Freq: Every day | ORAL | 0 refills | Status: DC

## 2021-01-29 MED ORDER — RIVAROXABAN 15 MG PO TABS: 15.0000 mg | ORAL_TABLET | Freq: Every day | ORAL | 0 refills | Status: DC

## 2021-01-29 NOTE — Telephone Encounter (Signed)
Need refill Rivaroxaban (XARELTO) 15 MG TABS tablet metoprolol succinate (TOPROL XL) 25 MG 24 hr tablet mirtazapine (REMERON) 15 MG tablet  albuterol (VENTOLIN HFA) 108 (90 Base) MCG/ACT inhaler   [id number] [place], [place] - [address]

## 2021-01-30 ENCOUNTER — Other Ambulatory Visit: Payer: Self-pay | Admitting: Internal Medicine

## 2021-01-30 ENCOUNTER — Telehealth: Payer: Self-pay | Admitting: Internal Medicine

## 2021-01-30 NOTE — Telephone Encounter (Signed)
Patient call ed the on-call pager regarding refills of her prescriptions. Specifically her Norco, xerelto and metorpolol. She waned to get her medications 2 days early as she is going out of town and odes not want to run out. I call her pharmacy to notify them of this change. Told patient to call us back if she has any issues.    Lawerance Cruel, D.O.  Internal Medicine Resident, PGY-2 [facility]  Pager: [phone] 5:32 PM, 01/30/2021

## 2021-01-30 NOTE — Telephone Encounter (Signed)
Called pt to inform she may not fill Oxycodone rx today but no answer; ringing, unable to leave a message.

## 2021-01-30 NOTE — Telephone Encounter (Signed)
Called pt - informed the meds she has requested have [facility] ; stated the pharmacy told her oxycodone will not be filled until tomorrow unless the doctor ok an early refill. Stated she has a ride today to pick up her meds and might not be able to go tomorrow. Told pt I will send message to the doctor.

## 2021-01-30 NOTE — Telephone Encounter (Signed)
Refill Request   metoprolol succinate (TOPROL XL) 25 MG 24 hr tablet  oxyCODONE-acetaminophen (PERCOCET) 10-325 MG tablet  albuterol (VENTOLIN HFA) 108 (90 Base) MCG/ACT inhaler  Rivaroxaban (XARELTO) 15 MG TABS tablet  [id number] [place], [place] - [address] (Ph: [phone]

## 2021-01-30 NOTE — Telephone Encounter (Signed)
Kristen [name],  I would rather just keep with our schedule she has agreed to, she will need to wait until the correct date to pick up the oxycodone. Thank you!!  [name]

## 2021-01-31 ENCOUNTER — Other Ambulatory Visit: Payer: Self-pay | Admitting: *Deleted

## 2021-01-31 MED ORDER — DULERA 200-5 MCG/ACT IN AERO: INHALATION_SPRAY | RESPIRATORY_TRACT | 0 refills | Status: DC

## 2021-02-03 ENCOUNTER — Telehealth: Payer: Self-pay

## 2021-02-03 NOTE — Telephone Encounter (Signed)
Returned call to patient. States she wanted to be sure she can use one of our O2 tanks when she comes for her appt on 02/06/2021 so as not to use up all of her portable oxygen. She will ask Front Desk to have clinical staff bring O2 tank to waiting room when she arrives.  Also, requesting second Moderna Covid vaccine; received first one as inpatient. Explained we do not have Covid vaccines in clinic. She will make alternate arrangements.

## 2021-02-03 NOTE — Telephone Encounter (Signed)
Requesting to speak with a nurse about COVID injection. Please call pt back.

## 2021-02-06 ENCOUNTER — Ambulatory Visit (INDEPENDENT_AMBULATORY_CARE_PROVIDER_SITE_OTHER): Payer: Medicare Other | Admitting: Student

## 2021-02-06 ENCOUNTER — Other Ambulatory Visit: Payer: Self-pay

## 2021-02-06 ENCOUNTER — Encounter: Payer: Self-pay | Admitting: Student

## 2021-02-06 DIAGNOSIS — R1013 Epigastric pain: Secondary | ICD-10-CM | POA: Diagnosis not present

## 2021-02-06 DIAGNOSIS — R110 Nausea: Secondary | ICD-10-CM | POA: Diagnosis not present

## 2021-02-06 DIAGNOSIS — R109 Unspecified abdominal pain: Secondary | ICD-10-CM | POA: Insufficient documentation

## 2021-02-06 MED ORDER — ONDANSETRON HCL 4 MG PO TABS: 4.0000 mg | ORAL_TABLET | Freq: Every day | ORAL | 2 refills | Status: DC | PRN

## 2021-02-06 MED ORDER — FLUTICASONE PROPIONATE 50 MCG/ACT NA SUSP: 1.0000 | Freq: Every day | NASAL | 3 refills | Status: DC | PRN

## 2021-02-06 NOTE — Progress Notes (Signed)
CC: ED visit follow-up  HPI:  Kristen [name] is a 75 y.o. woman with history as below who presents to clinic for follow-up after an ED visit for acute abdominal pain on 01/15/21. Her last clinic visit was on 12/26/20 with Dr. Johnney Ou.   To see the details of this patient's management of their acute and chronic problems, please refer to the Assessment & Plan under the Encounters tab.    Past Medical History:  Diagnosis Date  . Cellulitis   . Chronic venous stasis dermatitis of both lower extremities    Chronic problem that was being manged by PCP in [place]  . COPD (chronic obstructive pulmonary disease) ([place])   . Dyspnea   . 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  Respiratory: Negative for shortness of breath.   Cardiovascular: Negative for chest pain and palpitations.  Gastrointestinal: Positive for nausea. Negative for abdominal pain, constipation, diarrhea and vomiting.  Neurological: Negative for dizziness and weakness.    Physical Exam:  Vitals:   02/06/21 1353  BP: (!) 143/85  Pulse: 85  Temp: 98.4 F (36.9 C)  TempSrc: Oral  SpO2: 100%  Weight: 102 lb 6.4 oz (46.4 kg)  Height: 5' 10" (1.778 m)   Constitutional: very pleasant, thin/cachectic woman sitting in chair, in no acute distress HENT: normocephalic atraumatic, mucous membranes moist, nasal cannula in place Eyes: conjunctiva non-erythematous Neck: supple Cardiovascular: regular rate and rhythm, no m/r/g Pulmonary/Chest: normal work of breathing on room air, lungs clear to auscultation bilaterally Abdominal: soft, non-tender, non-distended Neurological: alert & oriented x 3 Skin: warm and dry Psych: normal mood and affect    Assessment & Plan:   See Encounters Tab for problem-based charting.  Patient discussed with Dr. Rebeca Alert

## 2021-02-06 NOTE — Assessment & Plan Note (Addendum)
Kristen [name] presents for follow-up after ED visit ~1 month ago on 01/15/21 for acute-onset epigastric abdominal pain. In the ED, her abdominal pain resolved without intervention, and she has since not had return of her symptoms. On chart review, she had a thorough evaluation, including CBC, CMP, lipase, urinalysis, CT abdomen pelvis, and EKG, none of which revealed a source. CBC demonstrated stable anemia. CMP showed stable chronic hyponatremia (130). Lipase was not elevated. CT abdomen pelvis was significant for moderate fecal retention and distended urinary bladder. Patient had not urinated in some prior to the imaging but urinated without issue afterward.  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   On exam, she has no tenderness to palpation.  Plan: - No change to patient's current regimen - As discussed in Nausea problem-based A&P, refilled patient's Zofran

## 2021-02-06 NOTE — Patient Instructions (Signed)
Kristen [name],   Thank you for your visit to the [facility] today. It was a pleasure seeing you. Today we discussed the following:  1) ED visit follow-up: I'm glad the stomach pain you experienced is all better! At today's visit, I refilled your Flonase and Zofran. Please let us know if your nausea is not controlled.  Please schedule a follow-up appointment with your PCP (Dr. Collene Gobble) in June, or sooner if necessary.  If you have any questions or concerns, please call our clinic at [phone] between 9am-5pm. Outside of these hours, call [phone] and ask for the internal medicine resident on call. If you feel you are having a medical emergency please call 911.

## 2021-02-06 NOTE — Assessment & Plan Note (Signed)
Ms. Rath presents to clinic after ED visit ~1 month ago for acute onset of abdominal pain which resolved without treatment. She mentions during our visit that she has been wondering if there is a different antiemetic she could try rather than Zofran. She reports the Zofran is still managing her nausea, and she is pleased with her weight gain, 88 lb -> 102 lb over the last 3 months.   Plan: - refilled Zofran 4 mg PRN - Counseled patient to let us know if her nausea is no longer being managed with the Zofran as further work-up would likely be warranted

## 2021-02-07 ENCOUNTER — Other Ambulatory Visit: Payer: Self-pay | Admitting: Student

## 2021-02-07 NOTE — Progress Notes (Signed)
Internal Medicine Clinic Attending  Case discussed with Dr. Shon Baton at the time of the visit.  We reviewed the resident's history and exam and pertinent patient test results.  I agree with the assessment, diagnosis, and plan of care documented in the resident's note.  Lenice Pressman, M.D., Ph.D.

## 2021-02-19 ENCOUNTER — Telehealth: Payer: Self-pay

## 2021-02-19 ENCOUNTER — Encounter: Payer: Medicare Other | Admitting: Student

## 2021-02-22 ENCOUNTER — Other Ambulatory Visit: Payer: Self-pay

## 2021-02-22 ENCOUNTER — Observation Stay (HOSPITAL_COMMUNITY)
Admission: EM | Admit: 2021-02-22 | Discharge: 2021-02-24 | Disposition: A | Discharge status: Home / Self Care | Payer: Medicare Other | Attending: Internal Medicine | Admitting: Internal Medicine

## 2021-02-22 ENCOUNTER — Emergency Department (HOSPITAL_COMMUNITY): Payer: Medicare Other

## 2021-02-22 DIAGNOSIS — Z20822 Contact with and (suspected) exposure to covid-19: Secondary | ICD-10-CM | POA: Insufficient documentation

## 2021-02-22 DIAGNOSIS — R0602 Shortness of breath: Secondary | ICD-10-CM | POA: Diagnosis present

## 2021-02-22 DIAGNOSIS — J441 Chronic obstructive pulmonary disease with (acute) exacerbation: Principal | ICD-10-CM | POA: Diagnosis present

## 2021-02-22 DIAGNOSIS — L98499 Non-pressure chronic ulcer of skin of other sites with unspecified severity: Secondary | ICD-10-CM | POA: Diagnosis not present

## 2021-02-22 DIAGNOSIS — J9602 Acute respiratory failure with hypercapnia: Secondary | ICD-10-CM | POA: Diagnosis not present

## 2021-02-22 DIAGNOSIS — Z87891 Personal history of nicotine dependence: Secondary | ICD-10-CM | POA: Diagnosis not present

## 2021-02-22 DIAGNOSIS — L98498 Non-pressure chronic ulcer of skin of other sites with other specified severity: Secondary | ICD-10-CM

## 2021-02-22 DIAGNOSIS — Z9981 Dependence on supplemental oxygen: Secondary | ICD-10-CM | POA: Insufficient documentation

## 2021-02-22 LAB — CBC WITH DIFFERENTIAL/PLATELET
Abs Immature Granulocytes: 0.05 10*3/uL (ref 0.00–0.07)
Basophils Absolute: 0.1 10*3/uL (ref 0.0–0.1)
Basophils Relative: 1 %
Eosinophils Absolute: 0.8 10*3/uL — ABNORMAL HIGH (ref 0.0–0.5)
Eosinophils Relative: 5 %
HCT: 38.8 % (ref 36.0–46.0)
Hemoglobin: 11.9 g/dL — ABNORMAL LOW (ref 12.0–15.0)
Immature Granulocytes: 0 %
Lymphocytes Relative: 19 %
Lymphs Abs: 2.8 10*3/uL (ref 0.7–4.0)
MCH: 31.8 pg (ref 26.0–34.0)
MCHC: 30.7 g/dL (ref 30.0–36.0)
MCV: 103.7 fL — ABNORMAL HIGH (ref 80.0–100.0)
Monocytes Absolute: 0.7 10*3/uL (ref 0.1–1.0)
Monocytes Relative: 5 %
Neutro Abs: 10.1 10*3/uL — ABNORMAL HIGH (ref 1.7–7.7)
Neutrophils Relative %: 70 %
Platelets: 405 10*3/uL — ABNORMAL HIGH (ref 150–400)
RBC: 3.74 MIL/uL — ABNORMAL LOW (ref 3.87–5.11)
RDW: 12 % (ref 11.5–15.5)
WBC: 14.4 10*3/uL — ABNORMAL HIGH (ref 4.0–10.5)
nRBC: 0 % (ref 0.0–0.2)

## 2021-02-22 LAB — COMPREHENSIVE METABOLIC PANEL
ALT: 10 U/L (ref 0–44)
AST: 16 U/L (ref 15–41)
Albumin: 3.9 g/dL (ref 3.5–5.0)
Alkaline Phosphatase: 78 U/L (ref 38–126)
Anion gap: 4 — ABNORMAL LOW (ref 5–15)
BUN: 6 mg/dL — ABNORMAL LOW (ref 8–23)
CO2: 39 mmol/L — ABNORMAL HIGH (ref 22–32)
Calcium: 9.1 mg/dL (ref 8.9–10.3)
Chloride: 89 mmol/L — ABNORMAL LOW (ref 98–111)
Creatinine, Ser: 0.49 mg/dL (ref 0.44–1.00)
GFR, Estimated: >60 mL/min (ref >60)
Glucose, Bld: 172 mg/dL — ABNORMAL HIGH (ref 70–99)
Potassium: 4.4 mmol/L (ref 3.5–5.1)
Sodium: 132 mmol/L — ABNORMAL LOW (ref 135–145)
Total Bilirubin: 0.6 mg/dL (ref 0.3–1.2)
Total Protein: 7.1 g/dL (ref 6.5–8.1)

## 2021-02-22 LAB — I-STAT ARTERIAL BLOOD GAS, ED
Acid-Base Excess: 11 mmol/L — ABNORMAL HIGH (ref 0.0–2.0)
Bicarbonate: 39.6 mmol/L — ABNORMAL HIGH (ref 20.0–28.0)
Calcium, Ion: 1.24 mmol/L (ref 1.15–1.40)
HCT: 37 % (ref 36.0–46.0)
Hemoglobin: 12.6 g/dL (ref 12.0–15.0)
O2 Saturation: 99 %
Patient temperature: 98.6
Potassium: 4.7 mmol/L (ref 3.5–5.1)
Sodium: 126 mmol/L — ABNORMAL LOW (ref 135–145)
TCO2: 42 mmol/L — ABNORMAL HIGH (ref 22–32)
pCO2 arterial: 73.8 mmHg (ref 32.0–48.0)
pH, Arterial: 7.338 — ABNORMAL LOW (ref 7.350–7.450)
pO2, Arterial: 141 mmHg — ABNORMAL HIGH (ref 83.0–108.0)

## 2021-02-22 LAB — LACTIC ACID, PLASMA: Lactic Acid, Venous: 0.7 mmol/L (ref 0.5–1.9)

## 2021-02-22 LAB — D-DIMER, QUANTITATIVE: D-Dimer, Quant: 1.97 ug/mL-FEU — ABNORMAL HIGH (ref 0.00–0.50)

## 2021-02-22 LAB — TROPONIN I (HIGH SENSITIVITY)
Troponin I (High Sensitivity): 9 ng/L (ref <18)
Troponin I (High Sensitivity): 26 ng/L — ABNORMAL HIGH (ref <18)

## 2021-02-22 LAB — SARS CORONAVIRUS 2 (TAT 6-24 HRS): SARS Coronavirus 2: NEGATIVE

## 2021-02-22 LAB — BRAIN NATRIURETIC PEPTIDE: B Natriuretic Peptide: 264.7 pg/mL — ABNORMAL HIGH (ref 0.0–100.0)

## 2021-02-22 MED ORDER — RIVAROXABAN 15 MG PO TABS
15.0000 mg | ORAL_TABLET | Freq: Every day | ORAL | Status: DC
  Administered 2021-02-23 – 2021-02-24 (×2): 15 mg via ORAL
  Filled 2021-02-22 (×2): qty 1

## 2021-02-22 MED ORDER — PREDNISONE 20 MG PO TABS
40.0000 mg | ORAL_TABLET | Freq: Every day | ORAL | Status: DC
  Administered 2021-02-23 – 2021-02-24 (×2): 40 mg via ORAL
  Filled 2021-02-22 (×2): qty 2

## 2021-02-22 MED ORDER — IOHEXOL 350 MG/ML SOLN
75.0000 mL | Freq: Once | INTRAVENOUS | Status: AC | PRN
  Administered 2021-02-22: 75 mL via INTRAVENOUS

## 2021-02-22 MED ORDER — METOPROLOL SUCCINATE ER 50 MG PO TB24
50.0000 mg | ORAL_TABLET | Freq: Every day | ORAL | Status: DC
  Administered 2021-02-23 – 2021-02-24 (×2): 50 mg via ORAL
  Filled 2021-02-22 (×2): qty 1

## 2021-02-22 MED ORDER — OXYCODONE-ACETAMINOPHEN 5-325 MG PO TABS
2.0000 | ORAL_TABLET | Freq: Once | ORAL | Status: AC
  Administered 2021-02-22: 2 via ORAL
  Filled 2021-02-22: qty 2

## 2021-02-22 MED ORDER — MOMETASONE FURO-FORMOTEROL FUM 200-5 MCG/ACT IN AERO
2.0000 | INHALATION_SPRAY | Freq: Two times a day (BID) | RESPIRATORY_TRACT | Status: DC
  Administered 2021-02-23 – 2021-02-24 (×3): 2 via RESPIRATORY_TRACT
  Filled 2021-02-22: qty 8.8

## 2021-02-22 MED ORDER — IPRATROPIUM-ALBUTEROL 0.5-2.5 (3) MG/3ML IN SOLN
3.0000 mL | Freq: Once | RESPIRATORY_TRACT | Status: AC
  Administered 2021-02-22: 3 mL via RESPIRATORY_TRACT
  Filled 2021-02-22: qty 3

## 2021-02-22 MED ORDER — PREDNISONE 20 MG PO TABS: 40.0000 mg | ORAL_TABLET | Freq: Every day | ORAL | Status: DC

## 2021-02-22 MED ORDER — DOXYCYCLINE HYCLATE 100 MG PO TABS
100.0000 mg | ORAL_TABLET | Freq: Once | ORAL | Status: AC
  Administered 2021-02-22: 100 mg via ORAL
  Filled 2021-02-22: qty 1

## 2021-02-22 MED ORDER — IPRATROPIUM-ALBUTEROL 0.5-2.5 (3) MG/3ML IN SOLN
3.0000 mL | Freq: Four times a day (QID) | RESPIRATORY_TRACT | Status: DC
  Administered 2021-02-22 – 2021-02-23 (×5): 3 mL via RESPIRATORY_TRACT
  Filled 2021-02-22 (×5): qty 3

## 2021-02-22 MED ORDER — UMECLIDINIUM BROMIDE 62.5 MCG/INH IN AEPB
1.0000 | INHALATION_SPRAY | Freq: Every day | RESPIRATORY_TRACT | Status: DC
  Administered 2021-02-23 – 2021-02-24 (×2): 1 via RESPIRATORY_TRACT
  Filled 2021-02-22: qty 7

## 2021-02-22 MED ORDER — ONDANSETRON HCL 4 MG PO TABS
4.0000 mg | ORAL_TABLET | Freq: Every day | ORAL | Status: DC | PRN
  Administered 2021-02-23 – 2021-02-24 (×2): 4 mg via ORAL
  Filled 2021-02-22 (×2): qty 1

## 2021-02-22 MED ORDER — PREDNISONE 20 MG PO TABS
40.0000 mg | ORAL_TABLET | Freq: Once | ORAL | Status: AC
  Administered 2021-02-22: 40 mg via ORAL
  Filled 2021-02-22: qty 2

## 2021-02-22 MED ORDER — OXYCODONE-ACETAMINOPHEN 5-325 MG PO TABS
1.0000 | ORAL_TABLET | Freq: Three times a day (TID) | ORAL | Status: DC | PRN
Start: 2021-02-22 — End: 2021-02-24
  Administered 2021-02-22 – 2021-02-24 (×5): 1 via ORAL
  Filled 2021-02-22 (×5): qty 1

## 2021-02-22 MED ORDER — MIRTAZAPINE 15 MG PO TABS
15.0000 mg | ORAL_TABLET | Freq: Every day | ORAL | Status: DC
  Administered 2021-02-23: 15 mg via ORAL
  Filled 2021-02-22: qty 1

## 2021-02-22 MED ORDER — NICOTINE 21 MG/24HR TD PT24
21.0000 mg | MEDICATED_PATCH | Freq: Every day | TRANSDERMAL | Status: DC
  Administered 2021-02-23 – 2021-02-24 (×2): 21 mg via TRANSDERMAL
  Filled 2021-02-22 (×2): qty 1

## 2021-02-22 NOTE — ED Triage Notes (Signed)
Pt from home BIB EMS for resp. Distress. Family called EMS for c/o worsening SOB since this am; no relief with breathing tx. EMS administered: 0.47m Epi IM, 2gm Mag, 1233mSolumedrol, 15 Albuterol, and 1 Atrovent. GCS of 15 initially with EMS; 8 on arrival. Pt follows commands.

## 2021-02-22 NOTE — ED Provider Notes (Signed)
[name] EMERGENCY DEPARTMENT Provider Note   CSN: [id number] Arrival date & time: 02/22/21  1207     History Chief Complaint  Patient presents with  . Respiratory Distress    Kristen [name] is a 75 y.o. female.  Has home O2. Unsure Kristen amount of O2 she uses.  EMS gave Kristen epi, mag, and solumedrol.  O2 sat remained 100% but patient declined from a GCS of 15 to 7.   Kristen history is provided by Kristen EMS personnel. Kristen history is limited by Kristen condition of Kristen patient.  Shortness of Breath Severity:  Severe Onset quality:  Sudden Duration:  1 day Timing:  Constant Progression:  Worsening Chronicity:  Recurrent Context comment:  History of COPD Relieved by:  Nothing Worsened by:  Movement Ineffective treatments:  Inhaler and oxygen Associated symptoms: no vomiting        Past Medical History:  Diagnosis Date  . Cellulitis   . Chronic venous stasis dermatitis of both lower extremities    Chronic problem that was being manged by PCP in [place]  . COPD (chronic obstructive pulmonary disease) ([place])   . Dyspnea   . 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 Abdominal pain 02/06/2021  . Health care maintenance 12/27/2020  . Paroxysmal atrial fibrillation with rapid ventricular response ([place]) 12/16/2020  . Nausea 11/07/2020  . Acute on chronic respiratory failure with hypoxia and hypercapnia ([place]) 10/22/2020  . Lower extremity ulceration ([place]) 10/04/2020  . Protein-calorie malnutrition, severe 09/26/2020  . Generalized anxiety disorder 08/27/2020  . 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 Smoking 07/18/2020  . Venous insufficiency of both lower extremities 07/18/2020  . COPD (chronic obstructive pulmonary disease) ([place]) 06/28/2020  . Protein-calorie malnutrition ([place]) 06/28/2020  . Hyponatremia 06/28/2020    Past Surgical History:  Procedure Laterality Date  . VASCULAR SURGERY        OB History   No obstetric history on file.     Family History  Problem Relation Age of Onset  . Diabetes [name]   . Diabetes Mellitus II [name]   . Diabetes Father   . Diverticulitis Sister     Social History   Tobacco Use  . Smoking status: Former Smoker    Packs/day: 0.25    Types: Cigarettes    Quit date: 10/16/2019    Years since quitting: 1.3  . Smokeless tobacco: Never Used  . Tobacco comment: 2-3 per day   Vaping Use  . Vaping Use: Never used  Substance Use Topics  . Alcohol use: Yes    Comment: occasionally  . Drug use: Never    Home Medications Prior to Admission medications   Medication Sig Start Date End Date Taking? Authorizing Provider  acetaminophen (TYLENOL) 325 MG tablet Take 325 mg by mouth every 6 (six) hours as needed for mild pain or headache.     [provider]  albuterol (VENTOLIN HFA) 108 (90 Base) MCG/ACT inhaler INHALE 1-2 PUFFS BY MOUTH EVERY 6 HOURS AS NEEDED FOR WHEEZE OR SHORTNESS OF BREATH Patient taking differently: Inhale 2 puffs into Kristen lungs every 6 (six) hours as needed for wheezing or shortness of breath. 01/13/21   Agyei, Caprice Kluver, MD  fluticasone (FLONASE) 50 MCG/ACT nasal spray Place 1 spray into both nostrils daily as needed for allergies or rhinitis. 02/06/21   Alexandria Lodge, MD  INCRUSE ELLIPTA 62.5 MCG/INH AEPB INHALE 1  PUFF BY MOUTH EVERY DAY Patient taking differently: Inhale 1 puff into Kristen lungs daily. 11/27/20   Jeralyn Bennett, MD  ipratropium-albuterol (DUONEB) 0.5-2.5 (3) MG/3ML SOLN TAKE 3 MLS BY NEBULIZATION EVERY 6 (SIX) HOURS AS NEEDED (SHORTNESS OF BREATH/WHEEZING.). 01/30/21 04/30/21  [name], MD  metoprolol succinate (TOPROL-XL) 25 MG 24 hr tablet TAKE 2 TABLETS BY MOUTH EVERY DAY 02/10/21   Gaylan Gerold, DO  mirtazapine (REMERON) 15 MG tablet TAKE 1 TABLET BY MOUTH EVERYDAY AT BEDTIME 02/10/21   Gaylan Gerold, DO  mometasone-formoterol [facility]) 200-5 MCG/ACT AERO INHALE 1 PUFF INTO Kristen LUNGS TWICE A  DAY 01/31/21   Gaylan Gerold, DO  Multiple Vitamin (MULTIVITAMIN WITH MINERALS) TABS tablet Take 1 tablet by mouth daily.    [provider]  nicotine (NICODERM CQ - DOSED IN MG/24 HOURS) 21 mg/24hr patch Place 1 patch (21 mg total) onto Kristen skin daily. 10/24/20   Lavina Hamman, MD  ondansetron (ZOFRAN) 4 MG tablet Take 1 tablet (4 mg total) by mouth daily as needed for nausea or vomiting. 02/06/21   Alexandria Lodge, MD  oxyCODONE-acetaminophen (PERCOCET) 10-325 MG tablet Take 1 tablet by mouth every 8 (eight) hours. 01/31/21 03/02/21  [name], MD  Rivaroxaban (XARELTO) 15 MG TABS tablet Take 1 tablet (15 mg total) by mouth daily. 01/29/21 02/28/21  Gaylan Gerold, DO    Allergies    Gabapentin  Review of Systems   Review of Systems  Unable to perform ROS: Acuity of condition  Respiratory: Positive for shortness of breath.   Gastrointestinal: Negative for vomiting.    Physical Exam Updated Vital Signs There were no vitals taken for this visit.  Physical Exam Constitutional:      Appearance: She is ill-appearing.     Comments: cachectic  HENT:     Head: Normocephalic and atraumatic.     Nose: Nose normal.     Mouth/Throat:     Mouth: Mucous membranes are dry.  Eyes:     Conjunctiva/sclera: Conjunctivae normal.  Cardiovascular:     Rate and Rhythm: Normal rate. Rhythm irregular.     Pulses: Normal pulses.  Pulmonary:     Effort: Respiratory distress present.     Comments: Decreased breath sounds throughout Abdominal:     General: Abdomen is flat. There is no distension.  Musculoskeletal:        General: No deformity.  Skin:    General: Skin is dry.     Comments: Shallow, circular wounds at bilateral medial malleoli  Neurological:     Mental Status: She is lethargic.     GCS: GCS eye subscore is 1. GCS verbal subscore is 3. GCS motor subscore is 6.     ED Results / Procedures / Treatments   Labs (all labs ordered are listed, but only abnormal results are  displayed) Labs Reviewed  CBC WITH DIFFERENTIAL/PLATELET - Abnormal; Notable for Kristen following components:      Result Value   WBC 14.4 (*)    RBC 3.74 (*)    Hemoglobin 11.9 (*)    MCV 103.7 (*)    Platelets 405 (*)    Neutro Abs 10.1 (*)    Eosinophils Absolute 0.8 (*)    All other components within normal limits  BRAIN NATRIURETIC PEPTIDE - Abnormal; Notable for Kristen following components:   B Natriuretic Peptide 264.7 (*)    All other components within normal limits  D-DIMER, QUANTITATIVE - Abnormal; Notable for Kristen following components:   D-Dimer, Quant 1.97 (*)  All other components within normal limits  COMPREHENSIVE METABOLIC PANEL - Abnormal; Notable for Kristen following components:   Sodium 132 (*)    Chloride 89 (*)    CO2 39 (*)    Glucose, Bld 172 (*)    BUN 6 (*)    Anion gap 4 (*)    All other components within normal limits  I-STAT ARTERIAL BLOOD GAS, ED - Abnormal; Notable for Kristen following components:   pH, Arterial 7.338 (*)    pCO2 arterial 73.8 (*)    pO2, Arterial 141 (*)    Bicarbonate 39.6 (*)    TCO2 42 (*)    Acid-Base Excess 11.0 (*)    Sodium 126 (*)    All other components within normal limits  TROPONIN I (HIGH SENSITIVITY) - Abnormal; Notable for Kristen following components:   Troponin I (High Sensitivity) 26 (*)    All other components within normal limits  SARS CORONAVIRUS 2 (TAT 6-24 HRS)  LACTIC ACID, PLASMA  BLOOD GAS, ARTERIAL  BLOOD GAS, ARTERIAL  POC SARS CORONAVIRUS 2 AG -  ED  TROPONIN I (HIGH SENSITIVITY)       EKG EKG Interpretation  Date/Time:  Saturday February 22 2021 12:08:40 EDT Ventricular Rate:  102 PR Interval:  190 QRS Duration: 107 QT Interval:  331 QTC Calculation: 432 R Axis:   84 Text Interpretation: Sinus tachycardia Borderline right axis deviation Probable left ventricular hypertrophy no acute ischemia Confirmed by Lorre Munroe ([id number]) on 02/22/2021 12:24:07 PM   Radiology CT Angio Chest PE W/Cm &/Or Wo  Cm  Result Date: 02/22/2021 CLINICAL DATA:  Respiratory distress with worsening shortness of breath since this morning and elevated D-dimer. Possible pulmonary embolism. EXAM: CT ANGIOGRAPHY CHEST WITH CONTRAST TECHNIQUE: Multidetector CT imaging of Kristen chest was performed using Kristen standard protocol during bolus administration of intravenous contrast. Multiplanar CT image reconstructions and MIPs were obtained to evaluate Kristen vascular anatomy. CONTRAST:  60m OMNIPAQUE IOHEXOL 350 MG/ML SOLN COMPARISON:  09/26/2020, 10/22/2020 FINDINGS: Cardiovascular: Heart is normal size. Mild calcified plaque over Kristen left anterior descending coronary artery and origin of Kristen right coronary artery. Mild prominence of Kristen ascending thoracic aorta measuring 3.5 cm unchanged. Calcified plaque over Kristen thoracic aorta. Pulmonary arterial system is well opacified without evidence of pulmonary emboli. Mediastinum/Nodes: No mediastinal or hilar adenopathy. Remaining mediastinal structures are unremarkable. Lungs/Pleura: Lungs are hyperexpanded. There is moderate centrilobular emphysematous disease. Again noted is mild nodular opacification over Kristen right middle lobe which is unchanged to slightly less prominent. No acute airspace process. No effusion. Mild mucoid impaction over Kristen left lower lobe and lingular airways. Upper Abdomen: Calcified plaque over Kristen abdominal aorta and proximal right renal artery unchanged. Remainder of Kristen visualized upper abdomen is unchanged. Musculoskeletal: Degenerative change of Kristen spine. Review of Kristen MIP images confirms Kristen above findings. IMPRESSION: 1. No acute cardiopulmonary disease and no evidence of pulmonary embolism. 2. Nodular airspace opacification over Kristen right middle lobe slightly less prominent compared to November 2021 and may be sequelae from previous infectious/inflammatory process. Recommend follow-up CT 6 months. 3. Emphysema and aortic atherosclerosis. Mild atherosclerotic  coronary artery disease. 4. Mild stable prominence of Kristen ascending thoracic aorta measuring 3.5 cm. Recommend annual imaging followup by CTA or MRA. This recommendation follows 2010 ACCF/AHA/AATS/ACR/ASA/SCA/SCAI/SIR/STS/SVM Guidelines for Kristen Diagnosis and Management of Patients with Thoracic Aortic Disease. Circulation.2010; 121:: J696-V893 Aortic aneurysm NOS (ICD10-I71.9). Aortic Atherosclerosis (ICD10-I70.0) and Emphysema (ICD10-J43.9). Electronically Signed   By: DMarin OlpM.D.   On:  02/22/2021 15:17   DG Chest Port 1 View  Result Date: 02/22/2021 CLINICAL DATA:  Dyspnea with worsening shortness of breath since this morning. EXAM: PORTABLE CHEST 1 VIEW COMPARISON:  12/14/2020 FINDINGS: Lungs are hyperexpanded without focal lobar consolidation or effusion. No pneumothorax. Cardiomediastinal silhouette and remainder of Kristen exam is unchanged. IMPRESSION: 1.  No acute cardiopulmonary disease. 2.  COPD. Electronically Signed   By: Marin Olp M.D.   On: 02/22/2021 12:36    Procedures .Critical Care Performed by: Arnaldo Natal, MD Authorized by: Arnaldo Natal, MD   Critical care provider statement:    Critical care time (minutes):  60   Critical care was necessary to treat or prevent imminent or life-threatening deterioration of Kristen following conditions:  CNS failure or compromise and respiratory failure   Critical care was time spent personally by me on Kristen following activities:  Development of treatment plan with patient or surrogate, discussions with consultants, evaluation of patient's response to treatment, examination of patient, obtaining history from patient or surrogate, ordering and performing treatments and interventions, ordering and review of laboratory studies, ordering and review of radiographic studies, pulse oximetry, re-evaluation of patient's condition and review of old charts   I assumed direction of critical care for this patient from another provider in my specialty: no      Care discussed with: admitting provider       Medications Ordered in ED Medications  ipratropium-albuterol (DUONEB) 0.5-2.5 (3) MG/3ML nebulizer solution 3 mL (3 mLs Nebulization Given 02/22/21 1237)  iohexol (OMNIPAQUE) 350 MG/ML injection 75 mL (75 mLs Intravenous Contrast Given 02/22/21 1447)  oxyCODONE-acetaminophen (PERCOCET/ROXICET) 5-325 MG per tablet 2 tablet (2 tablets Oral Given 02/22/21 1505)  doxycycline (VIBRA-TABS) tablet 100 mg (100 mg Oral Given 02/22/21 1706)    ED Course  I have reviewed Kristen triage vital signs and Kristen nursing notes.  Pertinent labs & imaging results that were available during my care of Kristen patient were reviewed by me and considered in my medical decision making (see chart for details).  Clinical Course as of 02/22/21 1917  Sat Feb 22, 2021  1222 Spoke with Kristen [name].  She is located in [place].  She understands that Kristen [name] is extremely ill, and she states that she would like Kristen patient to be intubated if necessary.    After a few minutes of BiPAP, Kristen patient is improving as far as Kristen respiratory status.  She is also becoming a little bit more alert.  She is currently a GCS of 14. [AW]  1351 Lactic acid, plasma [AW]  1436 Blood gas, arterial [AW]  1506 Kristen [name] is a without alert, awake, and on nasal cannula oxygen.  She is requesting pain medication for Kristen sore teeth.  Apparently she missed a dentist appointment Kristen last time she was hospitalized.  I updated Kristen about Kristen ED course.  Kristen only other thing she added to Kristen history was that she took 1 other Percocet a day.  I did consider whether or not an opioid overdose would be possible, but Kristen reported history of taking 1 extra dose is not consistent with Kristen clinical presentation. [AW]  1639 I spoke with [facility] who will admit Kristen patient. [AW]  1917 This patient was actually on Kristen teaching service, so I made contact with Kristen provider now who will be admitting Kristen patient. [AW]     Clinical Course User Index [AW] Arnaldo Natal, MD   MDM Rules/Calculators/A&P  Sherlynn Stalls presented with respiratory distress and obtundation.  Kristen presumptive diagnosis was COPD exacerbation.  EMS capnography showed CO2 readings in Kristen 60s to 70s.  Kristen patient was trialed on BiPAP.  I really wanted to avoid intubating Kristen, and even though Kristen mental status was a little bit below what was ideal for BiPAP, I thought we could probably turn Kristen around.  Within minutes, she was showing some response to treatment, and after some time on BiPAP, she was able to be weaned down closer to Kristen home setting of 3 to 3-1/2 L.  ED evaluation revealed no evidence of pneumonia, PE, or ACS.  Is likely she is experiencing a COPD exacerbation.  Another consideration is an opioid overdose since she has been taking Percocet 10 mg for some dental pain.  Nonetheless, she will be admitted for ongoing treatment and monitoring. Final Clinical Impression(s) / ED Diagnoses Final diagnoses:  Non-pressure chronic ulcer of skin of other sites with other specified severity ([place])  Acute respiratory failure with hypercapnia (H[facility])  COPD exacerbation ([place])    Rx / DC Orders ED Discharge Orders    None       Arnaldo Natal, MD 02/22/21 1918

## 2021-02-22 NOTE — ED Notes (Signed)
Patient transported to CT

## 2021-02-22 NOTE — Hospital Course (Signed)
Admitted 02/22/2021  Allergies: Gabapentin Pertinent Hx: ***COPD (Home 3-4L O2) and Venous stasis dermatitis of both lower extremities, chronic SIADH  75 y.o. female p/w ***  * ***COPD exacerbation: On ***li which is his home oxygen.  On inhalers.   *Chronic hyponatremia: 2/2 chronic   *Chronic venous stasis dermatitis of LEs:  Consults: *** none Meds: ***Dulera, Incruse Ellipta, as needed DuoNeb prednisone, metoprolol, Percocet VTE ppx: ***Xarelto IVF: *** Diet: ***

## 2021-02-22 NOTE — H&P (Addendum)
Date: 02/22/2021               Patient Name:  Kristen [name] MRN: [id number]  DOB: 04/24/1946 Age / Sex: 75 y.o., female   PCP: [name], MD         Medical Service: Internal Medicine Teaching Service         Attending Physician: Dr. Joya Gaskins, Mancel Bale, MD    First Contact: Dr. Foy Guadalajara Pager: [phone]  Second Contact: Dr. Jose Persia Pager: [phone]       After Hours (After 5p/  First Contact Pager: [phone]  weekends / holidays): Second Contact Pager: [phone]   Chief Complaint: Shortness of breath  History of Present Illness: Kristen [name] is a 75 yo F w/ PMH of COPD, chronic hypoxic respiratory failure on 3L at home, PAF, malnutrition presenting to [facility] with chief complaint of shortness of breath. She mentions she was in her usual state of health until yesterday after she had take out dinner with fish, she had worsening pain and nausea associated with her chronic dental pain. She thought nothing of it and went to sleep overnight but had poor night of sleep due to pain. In the am, she took two of her percocet and began to feel short of breath and ill so she called EMS.  Chart review shows multiple admissions for COPD exacerbation in the last 6 months. She has received most of her prior care at [facility] and does not have any recent PFTs. Source of previous COPD exacerbations were not found and was thought to be due to progression of her obstructive lung disease in setting of continued tobacco use.  On review of system, she denies any fevers, chills, malaise. She mentions some nausea but no vomiting, no diarrhea. She mentions some cough with yellow sputum but mentions this is chronic and typical for her. She denies any sick contact. She has been vaccinated 1 time while hospitalized in January 2022 but has yet to receive her second dose.  Meds:  Acetaminophen 380m q6hr PRN for pain Albuterol 108 inhaler 1-2 puffs q6hr PRN for shortness of breath Fluticasone 531m 1 spray  daily Umeclidinium bromide 62.56m63m puff daily Ipratropium-albuterol 3ml74m6hr PRN Metoprolol Succinate 50mg59mly Mirtazapine 156mg 53my Mometasone-formoterol 200-56mcg 113mff twice daily Nicotine patch 21mg / 756mr patch Ondansetron 4mg dail74mxycodone-acetaminophen 10-3256mg q8hr61m Rivaroxaban 156mg daily33mlergies: Allergies as of 02/22/2021 - Review Complete 02/22/2021  Allergen Reaction Noted  . Gabapentin Other (See Comments) 06/28/2020   Past Medical History:  Diagnosis Date  . Cellulitis   . Chronic venous stasis dermatitis of both lower extremities    [place] (H[facility]   . DyAdelphiea   . 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 Diabetes Mother   . Diabetes Mellitus II Mother   . Diabetes Father   . Diverticulitis Sister    Social History:  [place] Currently lives with sister. Able to perform ADLs, IADLs independently. Mentions 1 drink on occasional weekends. Used to smoke 2 pack daily for >40 years. Mentions that she quit tobacco 4 weeks ago. Denies any illicit substance use.  Review of Systems: A complete ROS was negative except as per HPI.  Physical Exam: Blood pressure (!) 148/86, pulse 93, temperature 97.6 F (36.4 C), temperature source Oral, resp. rate 16, height _0  (1.778 m), weight 46 kg, SpO2 94 %.  Gen: Thin, chronically ill-appearing, NAD HEENT: NCAT head, hearing intact, EOMI, Swollen nasal turbinates Neck: supple, ROM intact, no JVD, no cervical adenopathy CV: Tachycardic, regular rhythm, S1, S2 normal, No rubs, no murmurs, no gallops Pulm: Prolonged expiratory phase with end-expiratory wheeze, no cough, no rales Abd: Soft, BS+, NTND, No rebound, no guarding Extm: ROM  intact, Peripheral pulses intact, No peripheral edema Skin: Dry, Warm, chronic venous stasis changes Neuro: AAOx3  EKG: personally reviewed my interpretation is sinus rhythm, right axis deviation, multiple motion artifacts, no ischemic changes  CXR: personally reviewed my interpretation is hyperinflated lungs with flattened diaphragm, no lobar opacity  Assessment & Plan by Problem: Active Problems:   * No active hospital problems. *  Kristen [name] is a 75 yo F w/ PMH of COPD, tobacco use chronic hypoxic resp failure on 2L, pulmonary cachexia presenting to [facility] with acute on chronic hypoxic respiratory failure likely due to COPD exacerbation  Acute on chronic hypercapnic respiratory failure Encephalopathy due to acute on chronic hypoxic respiratory acidosis Presenting with acute on chronic respiratory failure. Multiple prior admissions for COPD exacerbations. In ED, worsening mentation with GCS 15 to 7. Initial Abg results pH 7.157, pCO2 >110, pO2 152, CO2 39. Consistent with acute on chronic resp acidosis. Possibly exacerbated by decreased respiratory drive from taking too much of her Percocet. Improved quickly with Bipap use. Chest CT performed due to elevated d-dimer with stable opacification on RML, improved from prior imaging. Exam shows significant wheezing and lack of airway movement concerning for COPD exacerbation. Will start prednisone. Would benefit from observation admission overnight considering frailty prior to discharge. 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 Low functional capacity and poor dentition contributing to poor oral intake.  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 Episode of AMS this  admission concerning for overuse. Will continue at lower dose to prevent withdrawal - Decrease home dose Percocet to 5-32540m8hr prn  Chronic SIADH Has hx of chronic hyponatremia. Work-up on prior admission showing SIADH. Chest CT negative for obvious malignancy. Na 132 this admission - Free water restriction - Monitor bmp    Paroxysmal A.fib History of A.fib. Found on January admission. Currently on metoprolol, xarelto. CHADVASC2 score of 4. - C/w home meds: metoprolol succinate 71m23mily, rivaroxaban 15mg72mly  Ascending thoracic aorta aneurysm Incidental finding on CTA. Recommend annula follow up - Repeat with PCP in 1 year  DVT prophx: Xarelto Diet: Full liquid due to dentition Bowel: Miralax Code: Full  Prior to Admission Living Arrangement: Home Anticipated Discharge Location: Home Barriers to Discharge: Medical treatment  Dispo: Admit patient to Observation with expected length of stay less than 2 midnights.  Signed: Dawnell Bryant, Mosetta Anis4/12/2020, 8:07 PM  Pager: [phone]

## 2021-02-22 NOTE — ED Notes (Signed)
Hospitalist inquired about missing pre-tx ABG. Lab was called and they stated that they received initial sample without a label and that it had to be redrawn since they do not accept specimens without labels. The RT that drew the specimen was called and told lab that the specimen would be redrawn in a few minutes. A repeat specimen for pre-tx ABG was never received by the lab.

## 2021-02-22 NOTE — ED Notes (Addendum)
Pt to be weaned off bipap

## 2021-02-22 NOTE — ED Notes (Signed)
Non-violent restraints removed.

## 2021-02-23 DIAGNOSIS — J9602 Acute respiratory failure with hypercapnia: Secondary | ICD-10-CM | POA: Diagnosis not present

## 2021-02-23 DIAGNOSIS — J449 Chronic obstructive pulmonary disease, unspecified: Secondary | ICD-10-CM | POA: Diagnosis not present

## 2021-02-23 LAB — HIV ANTIBODY (ROUTINE TESTING W REFLEX): HIV Screen 4th Generation wRfx: NONREACTIVE

## 2021-02-23 LAB — CBC
HCT: 36.4 % (ref 36.0–46.0)
Hemoglobin: 11.6 g/dL — ABNORMAL LOW (ref 12.0–15.0)
MCH: 31.2 pg (ref 26.0–34.0)
MCHC: 31.9 g/dL (ref 30.0–36.0)
MCV: 97.8 fL (ref 80.0–100.0)
Platelets: 323 10*3/uL (ref 150–400)
RBC: 3.72 MIL/uL — ABNORMAL LOW (ref 3.87–5.11)
RDW: 11.8 % (ref 11.5–15.5)
WBC: 4.9 10*3/uL (ref 4.0–10.5)
nRBC: 0 % (ref 0.0–0.2)

## 2021-02-23 LAB — BASIC METABOLIC PANEL
Anion gap: 7 (ref 5–15)
BUN: 8 mg/dL (ref 8–23)
CO2: 36 mmol/L — ABNORMAL HIGH (ref 22–32)
Calcium: 9.3 mg/dL (ref 8.9–10.3)
Chloride: 84 mmol/L — ABNORMAL LOW (ref 98–111)
Creatinine, Ser: 0.5 mg/dL (ref 0.44–1.00)
GFR, Estimated: >60 mL/min (ref >60)
Glucose, Bld: 128 mg/dL — ABNORMAL HIGH (ref 70–99)
Potassium: 4.8 mmol/L (ref 3.5–5.1)
Sodium: 127 mmol/L — ABNORMAL LOW (ref 135–145)

## 2021-02-23 MED ORDER — IPRATROPIUM-ALBUTEROL 0.5-2.5 (3) MG/3ML IN SOLN
3.0000 mL | Freq: Three times a day (TID) | RESPIRATORY_TRACT | Status: DC
  Administered 2021-02-24 (×2): 3 mL via RESPIRATORY_TRACT
  Filled 2021-02-23 (×2): qty 3

## 2021-02-23 MED ORDER — IPRATROPIUM-ALBUTEROL 0.5-2.5 (3) MG/3ML IN SOLN: 3.0000 mL | Freq: Four times a day (QID) | RESPIRATORY_TRACT | Status: DC | PRN

## 2021-02-23 NOTE — Progress Notes (Signed)
Internal Medicine Attending:   I saw and examined the patient. I reviewed Dr Noni Saupe note and I agree with the resident's findings and plan as documented in the resident's note.

## 2021-02-23 NOTE — Progress Notes (Signed)
   Subjective:   Patient states she feels somewhat "queasy" this morning and attributes this to poor PO intake over the past few days. She is on 3L by [place] currently, at home uses 2-3L.  Feels as though she is nearly back to baseline, however would prefer to stay today for additional observation.  Objective:  Vital signs in last 24 hours: Vitals:   02/22/21 2230 02/22/21 2300 02/23/21 0030 02/23/21 0338  BP: 122/76 124/81 (!) 139/92   Pulse: 90 86 92 89  Resp: (!) 21 (!) _0 Temp:   98.3 F (36.8 C)   TempSrc:   Oral   SpO2: 94% 94% 99% 99%  Weight:   42.9 kg   Height:   _1  (1.778 m)    General: Chronically ill cachectic female, no acute distress, resting comfortably HEENT: Poor dentition with evidence of erythema on the left lower molar with no abscess at this time.  Mucous membranes moist Cardiac: Irregularly irregular without tachycardia.  No murmurs Pulmonary: Minimal expiratory wheezing heard throughout, with decreased breath sounds bibasilar. MSK: Moving all extremities without difficulty Neuro: Alert and oriented x4, no focal deficits Psych: Normal affect and mood  Assessment/Plan:  Active Problems:   COPD exacerbation ([place])  Ms.Eastep is a 75 yo F w/ PMH of COPD complicated by chronic hypoxic resp failure on 2-3 L supplemental oxygen at home, pulmonary cachexia presenting to [facility] with acute on chronic hypoxic respiratory failure likely due to COPD exacerbation versus opioid overdose.  # Acute on chronic hypercapnic respiratory failure # Encephalopathy due to acute on chronic hypoxic respiratory acidosis (resolved) Possibly exacerbated by decreased respiratory drive from Percocet overuse verus COPD exacerbation given significant wheezing on admission exam.   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 Low functional capacity and poor dentition contributing to poor oral intake.   -Consult to dietitian -Continue mirtazapine 163mqhs  # Chronic pain on long term opioid use On oxycodone 10-650 q8hr prn for chronic lower extremity pain. Episode of AMS this admission concerning for overuse.  Home dose decreased.  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 Work-up on prior admission showing SIADH.  Stable at this time  -Free water restriction -Monitor bmp while admitted    # Paroxysmal A.fib - Continue home meds: metoprolol succinate 5071maily, rivaroxaban 35m53mily  # Ascending thoracic aorta aneurysm Incidental finding on CTA. Recommend annual follow up  - Repeat with PCP in 1 year  DVT prophx: Xarelto Diet: Full liquid due to dentition Bowel: Miralax Code: Full  Prior to Admission Living Arrangement: Home Anticipated Discharge Location: Home Barriers to Discharge: Medical treatment  Dispo: Anticipated discharge in approximately 1 day(s).   Dr. IuliJose Persiaernal Medicine PGY-2  Pager: [phone] 5pm on weekdays and 1pm on weekends: On Call pager [phone]/2022, 6:37 AM

## 2021-02-23 NOTE — Progress Notes (Signed)
Patient received from the ED via stretcher. O2 in use@ 3L/min.  Patient moved herself over from stretcher to bed.  VSS.  O2 continues.  Has NSL in both right and left f/a.  Has a pink area to buttocks but refused to have dressing placed over it.  Also has bruising noted to right arm.  Both inner heels have open areas.  Right 3x2 and Left 2x2.5  Dressings placed over each area.  Patient denies any pain and no distress noted. VSS.

## 2021-02-24 ENCOUNTER — Encounter (HOSPITAL_COMMUNITY): Payer: Self-pay | Admitting: Internal Medicine

## 2021-02-24 DIAGNOSIS — J441 Chronic obstructive pulmonary disease with (acute) exacerbation: Secondary | ICD-10-CM

## 2021-02-24 LAB — BASIC METABOLIC PANEL
Anion gap: 6 (ref 5–15)
BUN: 15 mg/dL (ref 8–23)
CO2: 39 mmol/L — ABNORMAL HIGH (ref 22–32)
Calcium: 9.1 mg/dL (ref 8.9–10.3)
Chloride: 80 mmol/L — ABNORMAL LOW (ref 98–111)
Creatinine, Ser: 0.65 mg/dL (ref 0.44–1.00)
GFR, Estimated: >60 mL/min (ref >60)
Glucose, Bld: 130 mg/dL — ABNORMAL HIGH (ref 70–99)
Potassium: 3.9 mmol/L (ref 3.5–5.1)
Sodium: 125 mmol/L — ABNORMAL LOW (ref 135–145)

## 2021-02-24 MED ORDER — COVID-19 MRNA VACC (MODERNA) 100 MCG/0.5ML IM SUSP
0.5000 mL | Freq: Once | INTRAMUSCULAR | Status: AC
  Administered 2021-02-24: 0.5 mL via INTRAMUSCULAR
  Filled 2021-02-24: qty 0.5

## 2021-02-24 MED ORDER — ALUM & MAG HYDROXIDE-SIMETH 200-200-20 MG/5ML PO SUSP
30.0000 mL | Freq: Once | ORAL | Status: AC
  Administered 2021-02-24: 30 mL via ORAL
  Filled 2021-02-24: qty 30

## 2021-02-24 MED ORDER — PREDNISONE 20 MG PO TABS: 40.0000 mg | ORAL_TABLET | Freq: Every day | ORAL | 0 refills | Status: DC

## 2021-02-24 MED ORDER — ADULT MULTIVITAMIN W/MINERALS CH
1.0000 | ORAL_TABLET | Freq: Every day | ORAL | Status: DC
  Administered 2021-02-24: 1 via ORAL
  Filled 2021-02-24: qty 1

## 2021-02-24 MED ORDER — AZITHROMYCIN 250 MG PO TABS: 250.0000 mg | ORAL_TABLET | Freq: Every day | ORAL | Status: DC

## 2021-02-24 MED ORDER — AZITHROMYCIN 250 MG PO TABS: ORAL_TABLET | ORAL | 0 refills | Status: DC

## 2021-02-24 MED ORDER — AZITHROMYCIN 500 MG PO TABS
500.0000 mg | ORAL_TABLET | Freq: Every day | ORAL | Status: AC
  Administered 2021-02-24: 500 mg via ORAL
  Filled 2021-02-24: qty 1

## 2021-02-24 MED ORDER — POLYETHYLENE GLYCOL 3350 17 G PO PACK
17.0000 g | PACK | Freq: Once | ORAL | Status: AC
  Administered 2021-02-24: 17 g via ORAL
  Filled 2021-02-24: qty 1

## 2021-02-24 MED ORDER — OXYCODONE-ACETAMINOPHEN 5-325 MG PO TABS: 1.0000 | ORAL_TABLET | Freq: Three times a day (TID) | ORAL | 0 refills | Status: DC | PRN

## 2021-02-24 NOTE — Progress Notes (Signed)
D/C instructions reviewed with patient. Wound care and medications discussed. All questions answered. IV removed, clean and intact. Niece to escort pt home with oxygen.  Clyde Canterbury, RN

## 2021-02-24 NOTE — Consult Note (Signed)
[facility] Nurse Consult Note: Patient receiving care in [place]. Reason for Consult: non-healing bilateral ankle wounds Wound type: unclear etiology Pressure Injury POA: Yes/No/NA Measurement: Right medial malleolus wound measures 1.8 cm x 1.2 cm; it is 75% light whiteish/yellow centrally, surrounded by pink. The left medial malleolus wound measures 2 cm x 1.5 cm with a very small central whiteish/yellow spot; remainder is pink. Wound bed: Drainage (amount, consistency, odor) serosanginous on existing foam dressings Periwound: dry, flaking skin Dressing procedure/placement/frequency:  Gently cleanse bilateral medial ankle wounds with saline. Pat dry. Place small foam dressings over the areas. This treatment was provided at the time of my visit. Monitor the wound area(s) for worsening of condition such as: Signs/symptoms of infection,  Increase in size,  Development of or worsening of odor, Development of pain, or increased pain at the affected locations.  Notify the medical team if any of these develop.  Thank you for the consult.  Discussed plan of care with the patient.  [facility] nurse will not follow at this time.  Please re-consult the [facility] team if needed.  Val Riles, RN, MSN, CWOCN, CNS-BC, pager [phone]

## 2021-02-24 NOTE — Discharge Summary (Signed)
Name: Kristen [name] MRN: [id number] DOB: 1946-01-31 75 y.o. PCP: [name], MD  Date of Admission: 02/22/2021 12:07 PM Date of Discharge: 02/24/2021 Attending Physician: Velna Ochs, MD  Discharge Diagnosis: 1. Active Problems:   COPD exacerbation [facility])  Discharge Medications: Allergies as of 02/24/2021      Reactions   Gabapentin Other (See Comments)   Hallucination/ nightmares      Medication List    STOP taking these medications   oxyCODONE-acetaminophen 10-325 MG tablet Commonly known as: Percocet Replaced by: oxyCODONE-acetaminophen 5-325 MG tablet     TAKE these medications   acetaminophen 325 MG tablet Commonly known as: TYLENOL Take 325 mg by mouth every 6 (six) hours as needed for mild pain or headache.   albuterol 108 (90 Base) MCG/ACT inhaler Commonly known as: VENTOLIN HFA INHALE 1-2 PUFFS BY MOUTH EVERY 6 HOURS AS NEEDED FOR WHEEZE OR SHORTNESS OF BREATH What changed: See the new instructions.   azithromycin 250 MG tablet Commonly known as: ZITHROMAX Starting tomorrow (April 5th, 2022), please take one tablet every day for the next four days. Start taking on: February 25, 2021   [facility] 200-5 MCG/ACT Aero Generic drug: mometasone-formoterol INHALE 1 PUFF INTO THE LUNGS TWICE A DAY What changed:   how much to take  how to take this  when to take this  additional instructions   fluticasone 50 MCG/ACT nasal spray Commonly known as: FLONASE Place 1 spray into both nostrils daily as needed for allergies or rhinitis.   Incruse Ellipta 62.5 MCG/INH Aepb Generic drug: umeclidinium bromide INHALE 1 PUFF BY MOUTH EVERY DAY What changed: See the new instructions.   ipratropium-albuterol 0.5-2.5 (3) MG/3ML Soln Commonly known as: DUONEB TAKE 3 MLS BY NEBULIZATION EVERY 6 (SIX) HOURS AS NEEDED (SHORTNESS OF BREATH/WHEEZING.).   metoprolol succinate 25 MG 24 hr tablet Commonly known as: TOPROL-XL TAKE 2 TABLETS BY MOUTH EVERY DAY    mirtazapine 15 MG tablet Commonly known as: REMERON TAKE 1 TABLET BY MOUTH EVERYDAY AT BEDTIME What changed: See the new instructions.   multivitamin with minerals Tabs tablet Take 1 tablet by mouth daily.   nicotine 21 mg/24hr patch Commonly known as: NICODERM CQ - dosed in mg/24 hours Place 1 patch (21 mg total) onto the skin daily.   ondansetron 4 MG tablet Commonly known as: ZOFRAN Take 1 tablet (4 mg total) by mouth daily as needed for nausea or vomiting.   oxyCODONE-acetaminophen 5-325 MG tablet Commonly known as: PERCOCET/ROXICET Take 1 tablet by mouth every 8 (eight) hours as needed for severe pain. Replaces: oxyCODONE-acetaminophen 10-325 MG tablet   predniSONE 20 MG tablet Commonly known as: DELTASONE Take 2 tablets (40 mg total) by mouth daily with breakfast. Start taking on: February 25, 2021   Rivaroxaban 15 MG Tabs tablet Commonly known as: XARELTO Take 1 tablet (15 mg total) by mouth daily.            Discharge Care Instructions  (From admission, onward)         Start     Ordered   02/24/21 0000  Discharge wound care:       Comments: Gently cleanse bilateral medial ankle wounds with saline. Pat dry. Place small foam dressings over the areas   02/24/21 1145         Disposition and follow-up:   Ms.[name] was discharged from [facility] in stable condition.  At the hospital follow up visit please address:  1.  COPD: Patient admitted for  COPD exacerbation in the setting of continued tobacco use and over consumption of prescribed opioids. Patient started on azithromycin, prednisone and nebulizers with improvement and return back to home oxygen requirement of 2-3L. Please assess patient's adherence to prescribed azithromycin (4 doses) and prednisone (2 doses) following discharge. Please assess patient's oxygen requirement and subjective report of symptoms.   2.  Labs / imaging needed at time of follow-up: BMP, CBC  3.  Pending labs/  test needing follow-up: None  Follow-up Appointments:  Follow-up Information    [name], MD. Schedule an appointment as soon as possible for a visit in 1 week(s).   Specialty: Internal Medicine Contact information: [place] [address] [place] Hospital Course by problem list:  #COPD exacerbation, active Patient presented with dyspnea found to have acute on chronic hypoxic, hypercapnic respiratory failure. Initial ABG revealed pH of 7.17 with pCO2 >110. Patient treated with BiPAP, Duonebs, prednisone, home inhalers and started on azithromycin on day of discharge. Patient's oxygenation improved to baseline and saturating well on 3L oxygen via nasal cannula.  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 RD consulted during hospitalization. Patient continued on home mirtazapine nightly.  #Chronic pain on long term opioid use Patient tolerated well a decrease of her home Percocet from 10-643m to 5-3278mQ8H PRN which she took approximately twice daily during hospitalization.  #Chronic SIADH Patient's sodium remained relatively stable throughout hospitalization. Prior workup revealed patient's hyponatremia chronic secondary to SIADH.  #Paroxysmal atrial fibrillation Patient's heart rate adequately controlled on current regimen of metoprolol 5031maily with continuation of rivaroxaban as well.  #Ascending thoracic aorta aneurysm Patient noted to have an incidental finding on CTA with recommendation for annual follow-up.  Pertinent Labs, Studies, and Procedures:  CBC Latest Ref Rng & Units 02/23/2021 02/22/2021 02/22/2021  WBC 4.0 - 10.5 K/uL 4.9 - 14.4(H)  Hemoglobin 12.0 - 15.0 g/dL 11.6(L) 12.6 11.9(L)  Hematocrit 36.0 - 46.0 % 36.4 37.0 38.8  Platelets 150 - 400 K/uL 323 - 405(H)   CMP Latest Ref Rng & Units 02/24/2021 02/23/2021 02/22/2021  Glucose 70 - 99 mg/dL 130(H) 128(H) -  BUN 8 - 23 mg/dL 15 8  -  Creatinine 0.44 - 1.00 mg/dL 0.65 0.50 -  Sodium 135 - 145 mmol/L 125(L) 127(L) 126(L)  Potassium 3.5 - 5.1 mmol/L 3.9 4.8 4.7  Chloride 98 - 111 mmol/L 80(L) 84(L) -  CO2 22 - 32 mmol/L 39(H) 36(H) -  Calcium 8.9 - 10.3 mg/dL 9.1 9.3 -  Total Protein 6.5 - 8.1 g/dL - - -  Total Bilirubin 0.3 - 1.2 mg/dL - - -  Alkaline Phos 38 - 126 U/L - - -  AST 15 - 41 U/L - - -  ALT 0 - 44 U/L - - -  CT Angio Chest PE W/Cm &/Or Wo Cm  Result Date: 02/22/2021 CLINICAL DATA:  Respiratory distress with worsening shortness of breath since this morning and elevated D-dimer. Possible pulmonary embolism. EXAM: CT ANGIOGRAPHY CHEST WITH CONTRAST TECHNIQUE: Multidetector CT imaging of the chest was performed using the standard protocol during bolus administration of intravenous contrast. Multiplanar CT image reconstructions and MIPs were obtained to evaluate the vascular anatomy. CONTRAST:  52m22mNIPAQUE IOHEXOL 350 MG/ML SOLN COMPARISON:  09/26/2020, 10/22/2020 FINDINGS: Cardiovascular: Heart is normal size. Mild calcified plaque over the left anterior descending coronary artery and origin of the right  coronary artery. Mild prominence of the ascending thoracic aorta measuring 3.5 cm unchanged. Calcified plaque over the thoracic aorta. Pulmonary arterial system is well opacified without evidence of pulmonary emboli. Mediastinum/Nodes: No mediastinal or hilar adenopathy. Remaining mediastinal structures are unremarkable. Lungs/Pleura: Lungs are hyperexpanded. There is moderate centrilobular emphysematous disease. Again noted is mild nodular opacification over the right middle lobe which is unchanged to slightly less prominent. No acute airspace process. No effusion. Mild mucoid impaction over the left lower lobe and lingular airways. Upper Abdomen: Calcified plaque over the abdominal aorta and proximal right renal artery unchanged. Remainder of the visualized upper abdomen is unchanged. Musculoskeletal: Degenerative  change of the spine. Review of the MIP images confirms the above findings. IMPRESSION: 1. No acute cardiopulmonary disease and no evidence of pulmonary embolism. 2. Nodular airspace opacification over the right middle lobe slightly less prominent compared to November 2021 and may be sequelae from previous infectious/inflammatory process. Recommend follow-up CT 6 months. 3. Emphysema and aortic atherosclerosis. Mild atherosclerotic coronary artery disease. 4. Mild stable prominence of the ascending thoracic aorta measuring 3.5 cm. Recommend annual imaging followup by CTA or MRA. This recommendation follows 2010 ACCF/AHA/AATS/ACR/ASA/SCA/SCAI/SIR/STS/SVM Guidelines for the Diagnosis and Management of Patients with Thoracic Aortic Disease. Circulation.2010; 121: T552-Z747. Aortic aneurysm NOS (ICD10-I71.9). Aortic Atherosclerosis (ICD10-I70.0) and Emphysema (ICD10-J43.9). Electronically Signed   By: Marin Olp M.D.   On: 02/22/2021 15:17   DG Chest Port 1 View  Result Date: 02/22/2021 CLINICAL DATA:  Dyspnea with worsening shortness of breath since this morning. EXAM: PORTABLE CHEST 1 VIEW COMPARISON:  12/14/2020 FINDINGS: Lungs are hyperexpanded without focal lobar consolidation or effusion. No pneumothorax. Cardiomediastinal silhouette and remainder of the exam is unchanged. IMPRESSION: 1.  No acute cardiopulmonary disease. 2.  COPD. Electronically Signed   By: Marin Olp M.D.   On: 02/22/2021 12:36   Discharge Instructions: Discharge Instructions    Diet - low sodium heart healthy   Complete by: As directed    Discharge instructions   Complete by: As directed    Ms. Pung,  It was an absolute pleasure having the opportunity to take care of you during your recent admission. You were hospitalized for COPD exacerbation. We recommend the following medication changes upon discharge.  -Please start taking azithromycin 224m every morning for the next four days (starting tomorrow, May 5th,  2022) -Please continue taking prednisone 423mdaily for the next two days (starting tomorrow, May 5th, 2022) -Decrease home Percocet to 5-32521mvery eight hours  Sincerely, MatPaulla DollyD   Discharge wound care:   Complete by: As directed    Gently cleanse bilateral medial ankle wounds with saline. Pat dry. Place small foam dressings over the areas   Increase activity slowly   Complete by: As directed      Signed: JohCato MulliganD 02/24/2021, 11:58 AM   Pager: [phone]

## 2021-02-24 NOTE — Progress Notes (Signed)
Initial Nutrition Assessment  DOCUMENTATION CODES:  Severe malnutrition in context of chronic illness  INTERVENTION:  Add Magic cup TID with meals, each supplement provides 290 kcal and 9 grams of protein.  Add Vital Cuisine Shake TID, each supplement provides 520 kcal and 22 grams of protein.  Add MVI with minerals daily.  NUTRITION DIAGNOSIS:  Severe Malnutrition related to chronic illness (COPD) as evidenced by severe fat depletion,severe muscle depletion.  GOAL:  Patient will meet greater than or equal to 90% of their needs  MONITOR:  PO intake,Supplement acceptance,Labs,Weight trends  REASON FOR ASSESSMENT:  Consult Assessment of nutrition requirement/status,Other (Comment) (COPD cachexia)  ASSESSMENT:  75 yo female with a PMH of COPD, chronic hypoxic respiratory failure on 3L at home, PAF, malnutrition who presents with acute on chronic hypercapnic respiratory failure and encephalopathy d/t acute on chronic hypoxic respiratory acidosis.  Spoke with pt at bedside. Pt reassured RD that she is "fine and doesn't need anything." Reports that she has gained 10 lbs recently and eating a lot of oatmeal and yogurt at home. On exam, pt severely depleted in all muscle and fat stores. Pt not open to Ensure/Boost at this time. She reports they taste too chalky.   RD recommends trying Magic Cup TID and Hormel shakes TID to promote caloric and protein intake. Also recommend MVI with minerals.  Relevant Medication: Remeron, prednisone, oxycodone, zofran Labs: reviewed; Na 127, Glucose 128 HbA1c: 5.6% (06/2020)  NUTRITION - FOCUSED PHYSICAL EXAM: Flowsheet Row Most Recent Value  Orbital Region Severe depletion  Upper Arm Region Severe depletion  Thoracic and Lumbar Region Severe depletion  Buccal Region Severe depletion  Temple Region Severe depletion  Clavicle Bone Region Severe depletion  Clavicle and Acromion Bone Region Severe depletion  Scapular Bone Region Severe depletion   Dorsal Hand Severe depletion  Patellar Region Severe depletion  Anterior Thigh Region Severe depletion  Posterior Calf Region Severe depletion  Edema (RD Assessment) None  Hair Reviewed  Eyes Reviewed  Mouth Reviewed  Skin Reviewed  Nails Reviewed     Diet Order:   Diet Order            DIET SOFT Room service appropriate? Yes; Fluid consistency: Thin  Diet effective now                EDUCATION NEEDS:  Education needs have been addressed  Skin:  Skin Assessment: Skin Integrity Issues: Skin Integrity Issues:: Incisions Incisions: L heel  Last BM:  02/22/21  Height:  Ht Readings from Last 1 Encounters:  02/23/21 5' 10" (1.778 m)   Weight:  Wt Readings from Last 1 Encounters:  02/23/21 42.9 kg   Ideal Body Weight:  68.2 kg  BMI:  Body mass index is 13.57 kg/m.  Estimated Nutritional Needs:  Kcal:  2076-1915 Protein:  95-110 grams Fluid:  >1.75 L  Derrel Nip, RD, LDN Registered Dietitian After Hours/Weekend Pager # in [place]

## 2021-02-24 NOTE — Progress Notes (Signed)
Subjective:   Overnight, no acute events.  This morning, patient states she had some mild shortness of breath, more so than usual but it is much improved now. She states that she feels significantly improved since admission. She lives at home with her sister and follows closely with our clinic. She states that she would like her second COVID-19 vaccination and is comfortable discharging home. She reports that she will be adherent to prednisone and azithromycin following discharge. She will follow-up with our clinic within one week of discharge.  Objective:  Vital signs in last 24 hours: Vitals:   02/23/21 2335 02/24/21 0442 02/24/21 0737 02/24/21 0808  BP: 115/73 135/87 127/79   Pulse: 82 86 91   Resp: 20  20   Temp: 98.2 F (36.8 C) 97.8 F (36.6 C) 98 F (36.7 C)   TempSrc: Oral Oral Oral   SpO2: 93% 96% 92% 93%  Weight:      Height:      On 3L oxygen via nasal cannula   Intake/Output Summary (Last 24 hours) at 02/24/2021 1031 Last data filed at 02/24/2021 0443 Gross per 24 hour  Intake 480 ml  Output 1575 ml  Net -1095 ml   Filed Weights   02/22/21 1947 02/23/21 0030  Weight: 46 kg 42.9 kg   Physical Exam Constitutional:      Comments: Cachetic woman sitting upright in hospital bed frequently coughing throughout conversation  Pulmonary:     Effort: Pulmonary effort is normal. No respiratory distress.     Breath sounds: Wheezing present.  Musculoskeletal:     Right lower leg: No edema.     Left lower leg: No edema.  Neurological:     General: No focal deficit present.     Mental Status: Mental status is at baseline.  Psychiatric:        Mood and Affect: Mood normal.        Behavior: Behavior normal.    Labs in last 24 hours: BMP Latest Ref Rng & Units 02/24/2021 02/23/2021 02/22/2021  Glucose 70 - 99 mg/dL 130(H) 128(H) -  BUN 8 - 23 mg/dL 15 8 -  Creatinine 0.44 - 1.00 mg/dL 0.65 0.50 -  BUN/Creat Ratio 12 - 28 - - -  Sodium 135 - 145 mmol/L 125(L) 127(L)  126(L)  Potassium 3.5 - 5.1 mmol/L 3.9 4.8 4.7  Chloride 98 - 111 mmol/L 80(L) 84(L) -  CO2 22 - 32 mmol/L 39(H) 36(H) -  Calcium 8.9 - 10.3 mg/dL 9.1 9.3 -   Imaging in last 24 hours: No results found.  Assessment/Plan:  Active Problems:   COPD exacerbation ([facility])  Kristen [name] is a 75 year old woman with past medical history significant for chronic hypoxic and hypercapnic respiratory failure 2/2 COPD on chronic home oxygen who presented to [facility] on [facility]/22/21 for shortness of breath and dental pain found to have acute on chronic hypercapnic respiratory failure secondary to COPD exacerbation.  #COPD exacerbation, active Patient is saturating above goal on her home oxygen requirement of 3L. On examination, patient does have frequent cough and end-expiratory wheezing despite two days of prednisone. Patient would benefit from escalation of her regimen to include azithromycin with continuation of systemic corticosteroids and nebulizers. -Star azithromycin (574m one dose followed by 2583mfor four doses) -Continue prednisone 4067mor 5 day course, day 3/5 -DuoNebs q6hr -Continue home supplemental home oxygen to maintain oxygen between 88% - 94% -Continue home inhaler regimen: Incruse, Dulera -Chronic pain management as listed below  #Pulmonary  cachexia due to COPD BMI of 13.57. Low functional capacity and poor dentition contributing to poor oral intake.  -Consult to dietitian, appreciate recommendations -Continue mirtazapine 29m nighty  #Chronic pain on long term opioid use Patient has tolerated well a decrease of her home percocet to half dose. -Continue with decreased dose of Percocet (5-325 mg) q8hr prn  #Chronic SIADH Patient's sodium is stable. -Free water restriction -Monitor bmp while admitted    #Paroxysmal atrial fibrillation, chronic Patient's heart rate adequately controlled on current regimen. -Metoprolol succinate 51mdaily -Rivaroxaban 1518maily  #Ascending  thoracic aorta aneurysm Incidental finding on CTA. Recommend annual follow up -Repeat with PCP in 1 year  #VTE ppx: Xarelto #Diet: Soft #Bowel regimen: None #Code status: Full code  Prior to Admission Living Arrangement: Home Anticipated Discharge Location: Home Barriers to Discharge: None  Dispo: Anticipated discharge today  Dr. MatFoy GuadalajaraD Internal Medicine PGY-1 Pager: [phone] 5pm on weekdays and 1pm on weekends: On Call pager [phone]/02/2021, 10:31 AM

## 2021-02-28 ENCOUNTER — Telehealth: Payer: Self-pay

## 2021-02-28 NOTE — Telephone Encounter (Signed)
Last rx written 02/24/21. Called pt - stated she did not get it; she had someone else to pick up her meds.  I called [facility] - stated pt picked up a 30 day supply on 3/11; she can pick up next refill on 3/10. Called pt back and informed of the above. She stated thank-you.

## 2021-02-28 NOTE — Telephone Encounter (Signed)
Need refill on oxyCODONE-acetaminophen (PERCOCET/ROXICET) 5-325 MG tablet ;pt contact [phone]   [id number] [place], [place] - [address]

## 2021-03-02 ENCOUNTER — Other Ambulatory Visit: Payer: Self-pay | Admitting: Student

## 2021-03-02 DIAGNOSIS — I480 Paroxysmal atrial fibrillation: Secondary | ICD-10-CM

## 2021-03-04 ENCOUNTER — Ambulatory Visit (INDEPENDENT_AMBULATORY_CARE_PROVIDER_SITE_OTHER): Payer: Medicare Other | Admitting: Internal Medicine

## 2021-03-04 ENCOUNTER — Telehealth: Payer: Self-pay | Admitting: Internal Medicine

## 2021-03-04 ENCOUNTER — Other Ambulatory Visit: Payer: Self-pay

## 2021-03-04 ENCOUNTER — Encounter: Payer: Self-pay | Admitting: Internal Medicine

## 2021-03-04 VITALS — BP 124/59 | HR 82 | Temp 97.8°F | Ht 70.0 in | Wt 96.6 lb

## 2021-03-04 DIAGNOSIS — F1190 Opioid use, unspecified, uncomplicated: Secondary | ICD-10-CM

## 2021-03-04 DIAGNOSIS — Z0120 Encounter for dental examination and cleaning without abnormal findings: Secondary | ICD-10-CM

## 2021-03-04 DIAGNOSIS — J441 Chronic obstructive pulmonary disease with (acute) exacerbation: Secondary | ICD-10-CM

## 2021-03-04 DIAGNOSIS — E46 Unspecified protein-calorie malnutrition: Secondary | ICD-10-CM

## 2021-03-04 DIAGNOSIS — R64 Cachexia: Secondary | ICD-10-CM

## 2021-03-04 DIAGNOSIS — J449 Chronic obstructive pulmonary disease, unspecified: Secondary | ICD-10-CM

## 2021-03-04 MED ORDER — OXYCODONE-ACETAMINOPHEN 5-325 MG PO TABS: 1.0000 | ORAL_TABLET | Freq: Three times a day (TID) | ORAL | 0 refills | Status: DC | PRN

## 2021-03-04 NOTE — Progress Notes (Signed)
   CC: f/u COPD exacerbation  HPI:  Kristen [name] is a 75 y.o. female with PMhx as stated below presenting for follow up from recent hospitalization for COPD exacerbation. She does not have any acute concerns at this time.  Please see problem based charting for complete assessment and plan.  Past Medical History:  Diagnosis Date  . Cellulitis   . Chronic venous stasis dermatitis of both lower extremities    Chronic problem that was being manged by PCP in [place]  . COPD (chronic obstructive pulmonary disease) ([place])   . Dyspnea   . 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  Physical Exam:  Vitals:   03/04/21 1408  BP: (!) 124/59  Pulse: 82  Temp: 97.8 F (36.6 C)  TempSrc: Oral  SpO2: 100%  Weight: 96 lb 9.6 oz (43.8 kg)  Height: _0  (1.778 m)   Physical Exam  Constitutional: Frail, chronically ill appearing elderly female; No distress.  HENT: Normocephalic and atraumatic, EOMI, conjunctiva normal, moist mucous membranes; poor dentition without signs of focal infection Cardiovascular: Normal rate, regular rhythm, S1 and S2 present, Distal pulses intact Respiratory: on 2L O2, No respiratory distress, no accessory muscle use. Lungs are clear to auscultation bilaterally. Musculoskeletal: Decreased muscle bulk; normal tone No peripheral edema noted. Neurological: Is alert and oriented x4, no apparent focal deficits noted. Skin: Warm and dry.  No rash, erythema, lesions noted. Psychiatric: Normal mood and affect. Behavior is normal. Judgment and thought content normal.    Assessment & Plan:   See Encounters Tab for problem based charting.  Patient discussed with Dr. Daryll Drown

## 2021-03-04 NOTE — Telephone Encounter (Signed)
Returned call to [place] at [place]. States patient p/u 10 day supply yesterday written by Dr. Cato Mulligan. Earliest fill date for today's Rx is 03/12/2021.  Call placed to patient to relay above info. States she called [facility] to make sure that today's Provider sent Rx for 10-325 so she isn't taking too much acetaminophen. 5-325 mg was sent today.

## 2021-03-04 NOTE — Patient Instructions (Addendum)
Ms Kristen [name],  It was a pleasure seeing you in clinic. Today we discussed:   COPD: I'm glad you're feeling better! Continue taking your Dulera and Incruse as prescribed. Continue with oxygen use.   Mouth Lesion: Follow up with your dentist as soon as possible!   I am ordering a bone density scan to check for any osteoporosis given frequent steroid use.   I will send in a prescription for your usual oxycodone-acetaminophen. Please take them as prescribed and contact us if you are experiencing severe pain despite taking this.    If you have any questions or concerns, please call our clinic at [phone] between 9am-5pm and after hours call [phone] and ask for the internal medicine resident on call. If you feel you are having a medical emergency please call 911.   Thank you, we look forward to helping you remain healthy!

## 2021-03-04 NOTE — Telephone Encounter (Signed)
Pt seen today and states the pharmacy called this afternoon to let her know she will not be able to get the following medication and to have someone one give them a call back:   oxyCODONE-acetaminophen (PERCOCET/ROXICET) 5-3   25 MG tablet   [id number] [place] [place] [facility] [address] (Ph: [phone]

## 2021-03-05 DIAGNOSIS — F1190 Opioid use, unspecified, uncomplicated: Secondary | ICD-10-CM | POA: Insufficient documentation

## 2021-03-05 DIAGNOSIS — Z0120 Encounter for dental examination and cleaning without abnormal findings: Secondary | ICD-10-CM | POA: Insufficient documentation

## 2021-03-05 LAB — BMP8+ANION GAP
Anion Gap: 16 mmol/L (ref 10.0–18.0)
BUN/Creatinine Ratio: 25 (ref 12–28)
BUN: 17 mg/dL (ref 8–27)
CO2: 30 mmol/L — ABNORMAL HIGH (ref 20–29)
Calcium: 9.5 mg/dL (ref 8.7–10.3)
Chloride: 85 mmol/L — ABNORMAL LOW (ref 96–106)
Creatinine, Ser: 0.67 mg/dL (ref 0.57–1.00)
Glucose: 114 mg/dL — ABNORMAL HIGH (ref 65–99)
Potassium: 4.9 mmol/L (ref 3.5–5.2)
Sodium: 131 mmol/L — ABNORMAL LOW (ref 134–144)
eGFR: 91 mL/min/{1.73_m2} (ref >59)

## 2021-03-05 LAB — CBC
Hematocrit: 30.7 % — ABNORMAL LOW (ref 34.0–46.6)
Hemoglobin: 10.1 g/dL — ABNORMAL LOW (ref 11.1–15.9)
MCH: 31.6 pg (ref 26.6–33.0)
MCHC: 32.9 g/dL (ref 31.5–35.7)
MCV: 96 fL (ref 79–97)
Platelets: 332 10*3/uL (ref 150–450)
RBC: 3.2 x10E6/uL — ABNORMAL LOW (ref 3.77–5.28)
RDW: 11.6 % — ABNORMAL LOW (ref 11.7–15.4)
WBC: 10.2 10*3/uL (ref 3.4–10.8)

## 2021-03-05 MED ORDER — OXYCODONE-ACETAMINOPHEN 10-325 MG PO TABS: 1.0000 | ORAL_TABLET | Freq: Three times a day (TID) | ORAL | 0 refills | Status: DC | PRN

## 2021-03-05 NOTE — Assessment & Plan Note (Signed)
Continues to have severe protein calorie malnutrition in setting of pulmonary cachexia 2/2 end-stage COPD. Patient does note having increased protein intake with Ensures and eating lots of vegetables. Given her BMI of 13 and frequent steroid use in setting of recurrent COPD exacerbations, at risk for osteoporosis.   Plan: DXA scan  Encourage protein intake

## 2021-03-05 NOTE — Assessment & Plan Note (Addendum)
Patient with history of COPD with recurrent admissions (every few months) over the past year presenting for follow up of her recent hospital admission for COPD exacerbation 4/2-4/4. She presented with acute on chronic hypercapnic respiratory failure requiring BiPAP with improvement in symptoms. She was weaned to her home oxygen of 2-3L. She was discharged on prednisone and azithromycin which she has completed. She reports feeling much better and denies requiring any increasing amounts of oxygen. She denies any difficulties with her ADL's and overall feels better since admission. She endorses medication compliance with ehr Dulera and Incruse Ellipta.  Unable to stage her COPD as she does not have any PFT's on file. However, given the frequency of her exacerbations and that she has concomitant pulmonary cachexia, suspect she is approaching end-stage of disease process.  This admission, patient's exacerbation was suspected to be secondary to taking too much of her opiate medication in setting of dental pain. I advised patient against this in the future for which she is agreeable. She does endorse compliance with her COPD medications otherwise. However, if she has persistent exacerbations, will have referral to pulmonology for pulmonary hygiene.   Plan: Continue Dulera 1 puff bid  Continue Incruse Ellipta 1 puff daily Continue albuterol inhaler 2 puff q6h prn PFT's ordered  Consider referral for pulm hygiene

## 2021-03-05 NOTE — Assessment & Plan Note (Signed)
Poor dentition with recently worsening dental pain. Patient notes that she had tooth abscess that drained a few days prior with salt water rinses. Although poor dentition, no signs of infection noted on exam today.  Plan: Encourage follow up with dentist

## 2021-03-05 NOTE — Assessment & Plan Note (Signed)
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 Per OV notes from November 2021, patient was previously on hydrocodone therapy; however, this did not adequately control her pain. Pain contract signed with Dr Laural Golden in November 2021 for oxycodone 10-326m q8h prn. Patient has been adherent to regimen and PDMP is appropriate.  However, she did have recent admission for COPD exacerbation in setting of excessive opiate use in setting of worsening dental pain. She was discharged on lower dose of her percocet. However, patient notes that she has significant bilateral lower extremity pain due to her arterial insufficiency.   Plan: Advised patient to adhere to percocet [id number] as needed, as prescribed  If worsening pain despite current regimen, patient instructed to contact office Percocet [id number] prn #[id number] sent to pharmacy

## 2021-03-05 NOTE — Telephone Encounter (Signed)
Medication adjusted to appropriate dosing. Called pharmacy to cancel prior Rx. Percocet 10-370m q8h prn should be available for patient to pick up at her pharmacy.

## 2021-03-05 NOTE — Telephone Encounter (Signed)
Requesting to speak with a nurse about  oxyCODONE-acetaminophen (PERCOCET/ROXICET) 5-325 MG tablet. Please call back.

## 2021-03-05 NOTE — Telephone Encounter (Signed)
Return pt's call - stated she was seen yesterday and the doctor told her she would  Change Percocet back to 10-325 mg. Rx sent yesterday was for 5-325 mg. Thanks

## 2021-03-05 NOTE — Telephone Encounter (Signed)
Pt called / informed of Percocet 10-325 mg rx.

## 2021-03-07 ENCOUNTER — Encounter (HOSPITAL_COMMUNITY): Payer: Self-pay | Admitting: Internal Medicine

## 2021-03-07 ENCOUNTER — Emergency Department (HOSPITAL_COMMUNITY): Payer: Medicare Other

## 2021-03-07 ENCOUNTER — Other Ambulatory Visit: Payer: Self-pay

## 2021-03-07 ENCOUNTER — Observation Stay (HOSPITAL_COMMUNITY)
Admission: EM | Admit: 2021-03-07 | Discharge: 2021-03-09 | Disposition: A | Discharge status: Home / Self Care | Payer: Medicare Other | Attending: Internal Medicine | Admitting: Internal Medicine

## 2021-03-07 DIAGNOSIS — J441 Chronic obstructive pulmonary disease with (acute) exacerbation: Secondary | ICD-10-CM | POA: Diagnosis present

## 2021-03-07 DIAGNOSIS — E871 Hypo-osmolality and hyponatremia: Secondary | ICD-10-CM | POA: Diagnosis not present

## 2021-03-07 DIAGNOSIS — E222 Syndrome of inappropriate secretion of antidiuretic hormone: Secondary | ICD-10-CM | POA: Insufficient documentation

## 2021-03-07 DIAGNOSIS — J449 Chronic obstructive pulmonary disease, unspecified: Secondary | ICD-10-CM | POA: Diagnosis not present

## 2021-03-07 DIAGNOSIS — Z20822 Contact with and (suspected) exposure to covid-19: Secondary | ICD-10-CM | POA: Insufficient documentation

## 2021-03-07 DIAGNOSIS — E43 Unspecified severe protein-calorie malnutrition: Secondary | ICD-10-CM | POA: Insufficient documentation

## 2021-03-07 DIAGNOSIS — Z9981 Dependence on supplemental oxygen: Secondary | ICD-10-CM | POA: Diagnosis not present

## 2021-03-07 DIAGNOSIS — R0602 Shortness of breath: Secondary | ICD-10-CM | POA: Diagnosis present

## 2021-03-07 DIAGNOSIS — Z79899 Other long term (current) drug therapy: Secondary | ICD-10-CM | POA: Diagnosis not present

## 2021-03-07 DIAGNOSIS — Z7901 Long term (current) use of anticoagulants: Secondary | ICD-10-CM | POA: Diagnosis not present

## 2021-03-07 DIAGNOSIS — Z87891 Personal history of nicotine dependence: Secondary | ICD-10-CM | POA: Insufficient documentation

## 2021-03-07 DIAGNOSIS — E46 Unspecified protein-calorie malnutrition: Secondary | ICD-10-CM | POA: Diagnosis present

## 2021-03-07 DIAGNOSIS — I480 Paroxysmal atrial fibrillation: Secondary | ICD-10-CM | POA: Diagnosis not present

## 2021-03-07 DIAGNOSIS — I700 Atherosclerosis of aorta: Secondary | ICD-10-CM | POA: Insufficient documentation

## 2021-03-07 DIAGNOSIS — F411 Generalized anxiety disorder: Secondary | ICD-10-CM | POA: Diagnosis present

## 2021-03-07 DIAGNOSIS — J9621 Acute and chronic respiratory failure with hypoxia: Principal | ICD-10-CM | POA: Insufficient documentation

## 2021-03-07 DIAGNOSIS — L97909 Non-pressure chronic ulcer of unspecified part of unspecified lower leg with unspecified severity: Secondary | ICD-10-CM | POA: Diagnosis present

## 2021-03-07 LAB — BASIC METABOLIC PANEL
Anion gap: 9 (ref 5–15)
BUN: 11 mg/dL (ref 8–23)
CO2: 33 mmol/L — ABNORMAL HIGH (ref 22–32)
Calcium: 8.7 mg/dL — ABNORMAL LOW (ref 8.9–10.3)
Chloride: 82 mmol/L — ABNORMAL LOW (ref 98–111)
Creatinine, Ser: 0.64 mg/dL (ref 0.44–1.00)
GFR, Estimated: >60 mL/min (ref >60)
Glucose, Bld: 107 mg/dL — ABNORMAL HIGH (ref 70–99)
Potassium: 4.5 mmol/L (ref 3.5–5.1)
Sodium: 124 mmol/L — ABNORMAL LOW (ref 135–145)

## 2021-03-07 LAB — TROPONIN I (HIGH SENSITIVITY)
Troponin I (High Sensitivity): 7 ng/L (ref <18)
Troponin I (High Sensitivity): 7 ng/L (ref <18)

## 2021-03-07 LAB — CBC WITH DIFFERENTIAL/PLATELET
Abs Immature Granulocytes: 0.06 10*3/uL (ref 0.00–0.07)
Basophils Absolute: 0 10*3/uL (ref 0.0–0.1)
Basophils Relative: 0 %
Eosinophils Absolute: 0.9 10*3/uL — ABNORMAL HIGH (ref 0.0–0.5)
Eosinophils Relative: 7 %
HCT: 33.8 % — ABNORMAL LOW (ref 36.0–46.0)
Hemoglobin: 10.7 g/dL — ABNORMAL LOW (ref 12.0–15.0)
Immature Granulocytes: 1 %
Lymphocytes Relative: 12 %
Lymphs Abs: 1.4 10*3/uL (ref 0.7–4.0)
MCH: 32.1 pg (ref 26.0–34.0)
MCHC: 31.7 g/dL (ref 30.0–36.0)
MCV: 101.5 fL — ABNORMAL HIGH (ref 80.0–100.0)
Monocytes Absolute: 0.9 10*3/uL (ref 0.1–1.0)
Monocytes Relative: 7 %
Neutro Abs: 8.8 10*3/uL — ABNORMAL HIGH (ref 1.7–7.7)
Neutrophils Relative %: 73 %
Platelets: 305 10*3/uL (ref 150–400)
RBC: 3.33 MIL/uL — ABNORMAL LOW (ref 3.87–5.11)
RDW: 11.9 % (ref 11.5–15.5)
WBC: 12 10*3/uL — ABNORMAL HIGH (ref 4.0–10.5)
nRBC: 0 % (ref 0.0–0.2)

## 2021-03-07 LAB — PROCALCITONIN: Procalcitonin: <0.1 ng/mL

## 2021-03-07 LAB — RESP PANEL BY RT-PCR (FLU A&B, COVID) ARPGX2
Influenza A by PCR: NEGATIVE
Influenza B by PCR: NEGATIVE
SARS Coronavirus 2 by RT PCR: NEGATIVE

## 2021-03-07 LAB — BRAIN NATRIURETIC PEPTIDE: B Natriuretic Peptide: 768 pg/mL — ABNORMAL HIGH (ref 0.0–100.0)

## 2021-03-07 MED ORDER — ACETAMINOPHEN 325 MG PO TABS: 650.0000 mg | ORAL_TABLET | Freq: Four times a day (QID) | ORAL | Status: DC | PRN

## 2021-03-07 MED ORDER — IPRATROPIUM-ALBUTEROL 0.5-2.5 (3) MG/3ML IN SOLN: 3.0000 mL | Freq: Four times a day (QID) | RESPIRATORY_TRACT | Status: DC | PRN

## 2021-03-07 MED ORDER — METHYLPREDNISOLONE SODIUM SUCC 125 MG IJ SOLR
60.0000 mg | Freq: Once | INTRAMUSCULAR | Status: AC
  Administered 2021-03-07: 60 mg via INTRAVENOUS
  Filled 2021-03-07: qty 2

## 2021-03-07 MED ORDER — OXYCODONE-ACETAMINOPHEN 5-325 MG PO TABS
1.0000 | ORAL_TABLET | Freq: Three times a day (TID) | ORAL | Status: DC | PRN
  Administered 2021-03-07 – 2021-03-08 (×3): 1 via ORAL
  Filled 2021-03-07 (×4): qty 1

## 2021-03-07 MED ORDER — BUSPIRONE HCL 10 MG PO TABS
10.0000 mg | ORAL_TABLET | Freq: Every day | ORAL | Status: DC
  Administered 2021-03-07 – 2021-03-09 (×3): 10 mg via ORAL
  Filled 2021-03-07 (×3): qty 1

## 2021-03-07 MED ORDER — PREDNISONE 20 MG PO TABS
40.0000 mg | ORAL_TABLET | Freq: Every day | ORAL | Status: DC
  Administered 2021-03-08 – 2021-03-09 (×2): 40 mg via ORAL
  Filled 2021-03-07 (×2): qty 2

## 2021-03-07 MED ORDER — OXYCODONE-ACETAMINOPHEN 5-325 MG PO TABS
1.0000 | ORAL_TABLET | Freq: Once | ORAL | Status: AC
  Administered 2021-03-07: 1 via ORAL
  Filled 2021-03-07: qty 1

## 2021-03-07 MED ORDER — FLUTICASONE PROPIONATE 50 MCG/ACT NA SUSP
1.0000 | Freq: Every day | NASAL | Status: DC | PRN
  Administered 2021-03-08: 1 via NASAL
  Filled 2021-03-07: qty 16

## 2021-03-07 MED ORDER — POLYETHYLENE GLYCOL 3350 17 G PO PACK: 17.0000 g | PACK | Freq: Every day | ORAL | Status: DC | PRN

## 2021-03-07 MED ORDER — MOMETASONE FURO-FORMOTEROL FUM 200-5 MCG/ACT IN AERO
1.0000 | INHALATION_SPRAY | Freq: Two times a day (BID) | RESPIRATORY_TRACT | Status: DC
  Administered 2021-03-07 – 2021-03-09 (×4): 1 via RESPIRATORY_TRACT
  Filled 2021-03-07: qty 8.8

## 2021-03-07 MED ORDER — MIRTAZAPINE 15 MG PO TABS
15.0000 mg | ORAL_TABLET | Freq: Every evening | ORAL | Status: DC | PRN
  Administered 2021-03-08: 15 mg via ORAL
  Filled 2021-03-07 (×2): qty 1

## 2021-03-07 MED ORDER — ACETAMINOPHEN 650 MG RE SUPP: 650.0000 mg | Freq: Four times a day (QID) | RECTAL | Status: DC | PRN

## 2021-03-07 MED ORDER — ALBUTEROL SULFATE HFA 108 (90 BASE) MCG/ACT IN AERS
2.0000 | INHALATION_SPRAY | Freq: Four times a day (QID) | RESPIRATORY_TRACT | Status: DC | PRN
  Administered 2021-03-08: 2 via RESPIRATORY_TRACT
  Filled 2021-03-07: qty 6.7

## 2021-03-07 MED ORDER — ONDANSETRON HCL 4 MG PO TABS
4.0000 mg | ORAL_TABLET | Freq: Every day | ORAL | Status: DC | PRN
  Administered 2021-03-07 – 2021-03-09 (×3): 4 mg via ORAL
  Filled 2021-03-07 (×5): qty 1

## 2021-03-07 MED ORDER — NICOTINE 21 MG/24HR TD PT24: 21.0000 mg | MEDICATED_PATCH | Freq: Every day | TRANSDERMAL | Status: DC

## 2021-03-07 MED ORDER — NICOTINE 21 MG/24HR TD PT24
21.0000 mg | MEDICATED_PATCH | Freq: Every day | TRANSDERMAL | Status: DC
  Administered 2021-03-07 – 2021-03-09 (×3): 21 mg via TRANSDERMAL
  Filled 2021-03-07 (×3): qty 1

## 2021-03-07 MED ORDER — RIVAROXABAN 15 MG PO TABS
15.0000 mg | ORAL_TABLET | Freq: Every day | ORAL | Status: DC
  Administered 2021-03-08 – 2021-03-09 (×2): 15 mg via ORAL
  Filled 2021-03-07 (×2): qty 1

## 2021-03-07 MED ORDER — METOPROLOL SUCCINATE ER 50 MG PO TB24
50.0000 mg | ORAL_TABLET | Freq: Every day | ORAL | Status: DC
  Administered 2021-03-08 – 2021-03-09 (×2): 50 mg via ORAL
  Filled 2021-03-07 (×2): qty 1

## 2021-03-07 MED ORDER — IPRATROPIUM-ALBUTEROL 0.5-2.5 (3) MG/3ML IN SOLN
3.0000 mL | Freq: Once | RESPIRATORY_TRACT | Status: AC
  Administered 2021-03-07: 3 mL via RESPIRATORY_TRACT
  Filled 2021-03-07: qty 3

## 2021-03-07 MED ORDER — UMECLIDINIUM BROMIDE 62.5 MCG/INH IN AEPB
1.0000 | INHALATION_SPRAY | Freq: Every day | RESPIRATORY_TRACT | Status: DC
  Administered 2021-03-08 – 2021-03-09 (×2): 1 via RESPIRATORY_TRACT
  Filled 2021-03-07: qty 7

## 2021-03-07 NOTE — Plan of Care (Signed)

## 2021-03-07 NOTE — ED Notes (Signed)
Patient periodically taking [place] off causing drop in Spo2 down to the mid 80's. Pt reminded to keep O2 on. Once patient places O2 back on O2 sat will increase to 95 % and above.

## 2021-03-07 NOTE — ED Triage Notes (Addendum)
BIB [facility] from home wih complaints of shortness of breath since this AM. Pt has hx of COPD normally wearing 3 L of O2 at home. When fire arrived to the patient they noted her O2 sat was 64 % so patient was placed on a NRB at 15 L. EMS evaluated patient and her O2 sat was noted to be 95% and above on the patient's normal 3 L. Pt did self administer her albuterol inhaler before EMS arrived and felt it helped her breathing. EMS noted wheezing bilaterally. Pt AOx4., states breathing "feels much better now". Pt denying pain at this point in time. Pt does also have cough w/ productive phelgm x1 day.

## 2021-03-07 NOTE — H&P (Addendum)
Date: 03/07/2021               Patient Name:  Kristen [name] MRN: [id number]  DOB: 1946-05-11 Age / Sex: 75 y.o., female   PCP: [name], MD         Medical Service: Internal Medicine Teaching Service         Attending Physician: Dr. Dorian Pod    First Contact: Dr. Virl Axe Pager: [phone]  Second Contact: Dr. 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  She reports she went to bed Saturday evening (4/14) feeling fine and then woke up around 5 am this morning feeling acutely short of breath. She got up to urinate however became incontinent on her way to the restroom, which has never happened before. She gave herself a nebulizer treatment and used her rescue inhaler without relief so called 911.   Also endorses nasal congestion, itchy eyes, and cough productive of white sputum for the last 2-3 days. States she has seasonal allergies for which she takes Flonase and uses lubricating drops for dry eyes. Also reports her nephew came to her house 3 days ago and had cough and congestion. She denies fevers, chills, headache, dizziness, lightheadedness, chest pain, abdominal pain, dysuria, hematuria, diarrhea. Endorses mild nausea which she attributes to not eating today, mild lower extremity swelling. States she was anxious this morning and did not want to burden her sister with her breathing problems.  Reports her appetite has been improved and reports drinking three 16-oz bottles of water daily. Requests something to drink several times during our interview. States since coming to the ED and receiving initial treatments she feels back to her baseline from a respiratory standpoint however feels tired and "not all the way better."   ED Course: During evaluation by EMS, patient noted to have O2 saturation of 64% on her home 3L so was placed on NRB at 15L with improvement to >95%. In the ED, afebrile, RR 18-25, P 70s-90s, BP 90s-140s/60s-80s with spO2 >92% on 3 L [place]. CBC with leukocytosis (12), macrocytic anemia (Hgb 10.7, MCV 101.5; baseline Hgb ~11). BMP notable for hyponatremia (124; baseline 128-130). Troponin 7>7. BNP 768. Procalcitonin <0.10. COVID/Flu negative. CXR demonstrates hyperinflated lungs with flattened diaphragm, no lobar opacity. EKG stable from prior, no ischemic changes. She was treated with duoneb, solumedrol 60 mg IV. IMTS consulted for admission.  Meds:  Current Meds  Medication Sig  . albuterol (VENTOLIN HFA) 108 (90 Base) MCG/ACT inhaler INHALE 1-2 PUFFS BY MOUTH EVERY 6 HOURS AS NEEDED FOR WHEEZE OR SHORTNESS OF BREATH (Patient taking differently: Inhale 2 puffs into the lungs every 6 (six) hours as needed for wheezing or shortness of breath.)  . busPIRone (BUSPAR) 10 MG tablet Take 10 mg by mouth daily.  . fluticasone (FLONASE) 50 MCG/ACT nasal spray PLACE 1 SPRAY INTO BOTH NOSTRILS DAILY AS NEEDED FOR ALLERGIES OR RHINITIS.  Marland Kitchen INCRUSE ELLIPTA 62.5 MCG/INH AEPB INHALE 1 PUFF BY  MOUTH EVERY DAY (Patient taking differently: Inhale 1 puff into the lungs daily.)  . ipratropium-albuterol (DUONEB) 0.5-2.5 (3) MG/3ML SOLN TAKE 3 MLS BY NEBULIZATION EVERY 6 (SIX) HOURS AS NEEDED (SHORTNESS OF BREATH/WHEEZING.).  Marland Kitchen metoprolol succinate (TOPROL-XL) 25 MG 24 hr tablet Take 2 tablets (50 mg total) by mouth daily.  . mirtazapine (REMERON) 15 MG tablet TAKE 1 TABLET BY MOUTH EVERYDAY AT  BEDTIME (Patient taking differently: Take 15 mg by mouth at bedtime.)  . mometasone-formoterol (DULERA) 200-5 MCG/ACT AERO Inhale 1 puff into the lungs in the morning and at bedtime.  . Multiple Vitamin (MULTIVITAMIN WITH MINERALS) TABS tablet Take 1 tablet by mouth daily.  . ondansetron (ZOFRAN) 4 MG tablet Take 1 tablet (4 mg total) by mouth daily as needed for nausea or vomiting.  Marland Kitchen oxyCODONE-acetaminophen (PERCOCET) 10-325 MG tablet Take 1 tablet by mouth every 8 (eight) hours as needed for pain.  . OXYGEN Inhale 3 L/min into the lungs.  . Rivaroxaban (XARELTO) 15 MG TABS tablet TAKE 1 TABLET (15 MG TOTAL) BY MOUTH DAILY.    Social History: Originally from [place]. Moved to [place] last year. Currently lives with her 44 year old sister who looks after a 82-year-old during the week. Able to perform ADLs and iADLs independently. Drinks 1 drink occasionally on the weekend. Used to smoke 2 ppd for >40 years but quit smoking a couple of months ago. Denies cocaine, marijuana, or other illicit substance use.    Family History:  Family History  Problem Relation Age of Onset  . Diabetes Mother   . Diabetes Mellitus II Mother   . Diabetes Father   . Diverticulitis Sister     Allergies: Allergies as of 03/07/2021 - Review Complete 03/07/2021  Allergen Reaction Noted  . Gabapentin Other (See Comments) 06/28/2020   Past Medical History:  Diagnosis Date  . Cellulitis   . Chronic venous stasis dermatitis of both lower extremities    Chronic problem that was being manged by PCP in [place]  . COPD (chronic obstructive pulmonary disease) ([place])   . Dyspnea   . 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   Physical Exam: Blood pressure 109/75, pulse (!) 118, temperature 98.8 F (37.1 C), temperature source Oral, resp. rate 20, height _0  (1.778 m), weight 43 kg, SpO2 95 %. Constitutional: Thin, tired- and  chronically ill-appearing woman sitting up in bed, in no acute distress, older than stated age HENT: normocephalic atraumatic, mucous membranes moist, nasal congestion, mild rhinorrhea, poor dentition Eyes: conjunctiva mildly erythematous Neck: supple, no JVD, no cervical adenopathy Cardiovascular: regular rate and rhythm, no m/r/g, 1-2+ lower extremity edema to the knees bilaterally; 1+ DP pulses Pulmonary/Chest: speaks in full sentences without dyspnea, normal work of breathing on 3 L , prolonged expiratory phase with end-expiratory wheeze, wet cough, no rales or rhonchi Abdominal: soft, non-tender, non-distended Neurological: alert & oriented to person, place, time, and situation Skin: warm and dry; chronic bilateral medial malleolar ulcerations (see below) with localized erythema, minimal serosanguinous drainage Psych: normal mood and affect        Labs: CBC    Component Value Date/Time   WBC 12.0 (H) 03/07/2021 1023   RBC 3.33 (L) 03/07/2021 1023   HGB 10.7 (L) 03/07/2021 1023   HGB 10.1 (L) 03/04/2021 1453   HCT 33.8 (L) 03/07/2021 1023   HCT 30.7 (L) 03/04/2021 1453   PLT 305 03/07/2021 1023  PLT 332 03/04/2021 1453   MCV 101.5 (H) 03/07/2021 1023   MCV 96 03/04/2021 1453   MCH 32.1 03/07/2021 1023   MCHC 31.7 03/07/2021 1023   RDW 11.9 03/07/2021 1023   RDW 11.6 (L) 03/04/2021 1453   LYMPHSABS 1.4 03/07/2021 1023   LYMPHSABS 2.3 12/26/2020 1441   MONOABS 0.9 03/07/2021 1023   EOSABS 0.9 (H) 03/07/2021 1023   EOSABS 0.7 (H) 12/26/2020 1441   BASOSABS 0.0 03/07/2021 1023   BASOSABS 0.1 12/26/2020 1441     CMP     Component Value Date/Time   NA 124 (L) 03/07/2021 1023   NA 131 (L) 03/04/2021 1453   K 4.5 03/07/2021 1023   CL 82 (L) 03/07/2021 1023   CO2 33 (H) 03/07/2021 1023   GLUCOSE 107 (H) 03/07/2021 1023   BUN 11 03/07/2021 1023   BUN 17 03/04/2021 1453   CREATININE 0.64 03/07/2021 1023   CALCIUM 8.7 (L) 03/07/2021 1023   PROT 7.1 02/22/2021 1217    ALBUMIN 3.9 02/22/2021 1217   AST 16 02/22/2021 1217   ALT 10 02/22/2021 1217   ALKPHOS 78 02/22/2021 1217   BILITOT 0.6 02/22/2021 1217   GFRNONAA >60 03/07/2021 1023   GFRAA 105 12/26/2020 1441    Imaging: DG Chest Port 1 View  Result Date: 03/07/2021 CLINICAL DATA:  Short of breath since this morning. History of COPD. EXAM: PORTABLE CHEST 1 VIEW COMPARISON:  Chest radiograph, 02/22/2021 and older studies. Chest CT, 02/22/2021. FINDINGS: Cardiac silhouette is normal in size. No mediastinal or hilar masses. Lungs are hyperexpanded. There are heterogeneously thickened interstitial and bronchial markings without change from the prior exam. No lung consolidation. No evidence of pulmonary edema. No pleural effusion or pneumothorax. Skeletal structures are demineralized but grossly intact. IMPRESSION: 1. No acute cardiopulmonary disease. 2. Advanced COPD. Electronically Signed   By: Lajean Manes M.D.   On: 03/07/2021 11:08   EKG: personally reviewed. My interpretation is normal sinus rhythm, right axis deviation, multiple motion artifacts, no ischemic changes  Assessment & Plan by Problem: Principal Problem:   Hyponatremia Active Problems:   Protein-calorie malnutrition (HCC)   Generalized anxiety disorder   Lower extremity ulceration (HCC)   COPD exacerbation (HCC)  Kyeisha Janowicz is a 75 year old woman with past medical history of chronic hypoxic and hypercapnic respiratory failure secondary to COPD on chronic 3L home oxygen with multiple admissions for COPD in the last 6 months, most recently 02/22/21-02/24/21, paroxysmal atrial fibrillation, chronic protein-calorie malnutrition, pulmonary cachexia presenting to [facility] ED with acute on chronic hypoxic respiratory failure likely due to COPD exacerbation and acute on chronic hyponatremia.  Acute on chronic hypoxic respiratory failure COPD on 3 L home oxygen Presenting after acute onset of dyspnea this morning and found by EMS to have spO2  64% on home 3 L O2. Reports feeling back to baseline from a respiratory standpoint and back on home 3 L after initial treatment with IV solumedrol and duoneb in the ED. Exam with mild diffuse end-expiratory wheezes, productive cough. CXR without focal infiltrate. Given nasal congestion, productive cough, recent sick contact, and seasonal allergies, suspect trigger of exacerbation viral illness vs allergies. Several admissions for COPD exacerbations in the last 6 months, most recently 02/22/21-02/24/21 during which she was stabilized, discharged to complete a course of azithromycin and prednisone, instructed to decrease her home Percocet from q6h to q8h. Given the severity of her disease and several recent exacerbations, goals of care conversation should be a priority this admission. 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 Patient endorses fatigue and frequent thirst. Has history of chronic hyponatremia with recent baseline sodium 128-130. Work-up during a prior admission showed SIADH. Reports average daily water consumption of three 16-oz water bottles however also drinks coffee and tea.  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 Wounds without tenderness or drainage. Wound healing impaired by poor nutritional status. During last admission for COPD exacerbation her opioid dose was decreased. - WOCN consult - Continue Percocet 5-325 q8h PRN  Lower extremity edema Suspect multifactorial, related to poor nutritional status and diastolic dysfunction. Exam with wheezes but not rales or rhonchi, no JVD. 10/2020 echo  with LVEF 55-60% and grade I diastolic dysfunction. Given severe COPD, likely some degree of cor pulmonale (also evidenced by pAF). - Elevation, compression as tolerated - continue to monitor  Episode of urinary incontinence Patient reports episode of urinary incontinence during episode of shortness of breath this morning. States this has never happened before. Denies dysuria, hematuria, hesitancy, sensation of incomplete emptying. No abdominal pain or suprapubic tenderness. Suspect this was more related to respiratory distress and possible anxiety than any bladder issue. 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 Low functional capacity and poor dentition contributing to poor oral intake. - consult to dietitian - continue home mirtazapine 15 mg QHS  Paroxysmal atrial fibrillation New onset during hospital admission January 2022. Currently on metoprolol and Xarelto. - continue home metoprolol succinate 50 mg daily - continue home rivaroxaban 15 mg daily  Anxiety Continue home buspar 10 mg daily.  Goals of care Patient states she has been discussing filling out MOST form with family. On chart review, she has had both DNR and full code statuses. Today she requests "not too much" CPR and intubation. Given her severe COPD and recent frequent admissions, would continue goals of care conversations this admission. Consider palliative care consult.  Diet: Normal with 1200 mL water restriction VTE: Xarelto IVF: None Code: Full  Prior to Admission Living Arrangement: Home Anticipated Discharge Location: Home  Dispo: Admit patient to Observation with expected length of stay less than 2 midnights.  Signed: Alexandria Lodge, MD Internal Medicine Resident, PGY-1 [facility] Pager: [phone] 12:59 PM, 03/07/2021

## 2021-03-07 NOTE — ED Provider Notes (Signed)
[facility]ERGENCY DEPARTMENT Provider Note   CSN: [id number] Arrival date & time: 03/07/21  1019     History Chief Complaint  Patient presents with  . Shortness of Breath    Kristen [name] is a 75 y.o. female.  Patient presents with chief complaint of shortness of breath.  Symptoms ongoing since last night.  She states she has a chronic cough from her COPD and smoking unchanged.  She states she might of had subjective fevers last night but no recorded temperatures.  She was recently admitted a few weeks ago for shortness of breath and possible pneumonia and she finished a course of antibiotics.  Denies headache or chest pain or abdominal pain.        Past Medical History:  Diagnosis Date  . Cellulitis   . Chronic venous stasis dermatitis of both lower extremities    Chronic problem that was being manged by PCP in [place]  . COPD (chronic obstructive pulmonary disease) ([place])   . Dyspnea   . 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 Aortic atherosclerosis ([place]) 03/07/2021  . Opiate use 03/05/2021  . Need for assessment by dentistry for poor dentition 03/05/2021  . COPD exacerbation ([place]) 02/22/2021  . Abdominal pain 02/06/2021  . Health care maintenance 12/27/2020  . Paroxysmal atrial fibrillation with rapid ventricular response ([place]) 12/16/2020  . Nausea 11/07/2020  . Acute on chronic respiratory failure with hypoxia and hypercapnia ([place]) 10/22/2020  . Lower extremity ulceration ([place]) 10/04/2020  . Generalized anxiety disorder 08/27/2020  . 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 Smoking 07/18/2020  . Venous insufficiency of both lower extremities 07/18/2020  . COPD (chronic obstructive pulmonary disease) ([place]) 06/28/2020  . Protein-calorie malnutrition ([place]) 06/28/2020  . Hyponatremia 06/28/2020    Past Surgical History:  Procedure Laterality Date  . VASCULAR SURGERY       OB  History   No obstetric history on file.     Family History  Problem Relation Age of Onset  . Diabetes Mother   . Diabetes Mellitus II Mother   . Diabetes Father   . Diverticulitis Sister     Social History   Tobacco Use  . Smoking status: Former Smoker    Packs/day: 0.25    Types: Cigarettes    Quit date: 10/16/2019    Years since quitting: 1.3  . Smokeless tobacco: Never Used  . Tobacco comment: 2-3 per day   Vaping Use  . Vaping Use: Never used  Substance Use Topics  . Alcohol use: Yes    Comment: occasionally  . Drug use: Never    Home Medications Prior to Admission medications   Medication Sig Start Date End Date Taking? Authorizing Provider  albuterol (VENTOLIN HFA) 108 (90 Base) MCG/ACT inhaler INHALE 1-2 PUFFS BY MOUTH EVERY 6 HOURS AS NEEDED FOR WHEEZE OR SHORTNESS OF BREATH Patient taking differently: Inhale 2 puffs into the lungs every 6 (six) hours as needed for wheezing or shortness of breath. 01/13/21  Yes Agyei, Obed K, MD  busPIRone (BUSPAR) 10 MG tablet Take 10 mg by mouth daily. 03/02/21  Yes [provider]  fluticasone (FLONASE) 50 MCG/ACT nasal spray PLACE 1 SPRAY INTO BOTH NOSTRILS DAILY AS NEEDED FOR ALLERGIES OR RHINITIS. 03/04/21  Yes Jeralyn Bennett, MD  INCRUSE ELLIPTA 62.5 MCG/INH AEPB INHALE 1 PUFF BY MOUTH EVERY DAY Patient taking differently: Inhale 1 puff into the  lungs daily. 11/27/20  Yes Jeralyn Bennett, MD  ipratropium-albuterol (DUONEB) 0.5-2.5 (3) MG/3ML SOLN TAKE 3 MLS BY NEBULIZATION EVERY 6 (SIX) HOURS AS NEEDED (SHORTNESS OF BREATH/WHEEZING.). 01/30/21 04/30/21 Yes [name], MD  metoprolol succinate (TOPROL-XL) 25 MG 24 hr tablet Take 2 tablets (50 mg total) by mouth daily. 03/04/21  Yes Aslam, Loralyn Freshwater, MD  mirtazapine (REMERON) 15 MG tablet TAKE 1 TABLET BY MOUTH EVERYDAY AT BEDTIME Patient taking differently: Take 15 mg by mouth at bedtime as needed (sleep). 02/10/21  Yes Gaylan Gerold, DO  mometasone-formoterol (DULERA)  200-5 MCG/ACT AERO Inhale 1 puff into the lungs in the morning and at bedtime. 03/04/21  Yes Aslam, Loralyn Freshwater, MD  Multiple Vitamin (MULTIVITAMIN WITH MINERALS) TABS tablet Take 1 tablet by mouth daily.   Yes [provider]  ondansetron (ZOFRAN) 4 MG tablet Take 1 tablet (4 mg total) by mouth daily as needed for nausea or vomiting. 02/06/21  Yes Alexandria Lodge, MD  oxyCODONE-acetaminophen (PERCOCET) 10-325 MG tablet Take 1 tablet by mouth every 8 (eight) hours as needed for pain. 03/05/21 04/04/21 Yes Aslam, Loralyn Freshwater, MD  OXYGEN Inhale 3 L/min into the lungs.   Yes [provider]  Rivaroxaban (XARELTO) 15 MG TABS tablet TAKE 1 TABLET (15 MG TOTAL) BY MOUTH DAILY. Patient taking differently: Take 15 mg by mouth daily. 12/18/20 12/18/21 Yes Alexandria Lodge, MD  metoprolol succinate (TOPROL-XL) 25 MG 24 hr tablet TAKE 2 TABLETS (50 MG TOTAL) BY MOUTH DAILY. Patient not taking: No sig reported 12/18/20 12/18/21  Alexandria Lodge, MD  mirtazapine (REMERON) 15 MG tablet TAKE 1 TABLET (15 MG TOTAL) BY MOUTH AT BEDTIME. Patient not taking: Reported on 03/07/2021 12/18/20 12/18/21  Alexandria Lodge, MD  nicotine (NICODERM CQ - DOSED IN MG/24 HOURS) 21 mg/24hr patch Place 1 patch (21 mg total) onto the skin daily. Patient not taking: Reported on 03/07/2021 10/24/20   Lavina Hamman, MD    Allergies    Gabapentin  Review of Systems   Review of Systems  Constitutional: Negative for fever.  HENT: Negative for ear pain.   Eyes: Negative for pain.  Respiratory: Positive for cough and shortness of breath.   Cardiovascular: Negative for chest pain.  Gastrointestinal: Negative for abdominal pain.  Genitourinary: Negative for flank pain.  Musculoskeletal: Negative for back pain.  Skin: Negative for rash.  Neurological: Negative for headaches.    Physical Exam Updated Vital Signs BP 109/75   Pulse (!) 118   Temp 98.8 F (37.1 C) (Oral)   Resp 20   Ht _0  (1.778 m)   Wt 43 kg   SpO2 95%   BMI 13.60  kg/m   Physical Exam Constitutional:      General: She is not in acute distress.    Appearance: Normal appearance.  HENT:     Head: Normocephalic.     Nose: Nose normal.  Eyes:     Extraocular Movements: Extraocular movements intact.  Cardiovascular:     Rate and Rhythm: Normal rate.  Pulmonary:     Effort: Pulmonary effort is normal.     Breath sounds: Wheezing present.  Musculoskeletal:        General: Normal range of motion.     Cervical back: Normal range of motion.  Neurological:     General: No focal deficit present.     Mental Status: She is alert. Mental status is at baseline.     ED Results / Procedures / Treatments   Labs (all labs ordered are listed,  but only abnormal results are displayed) Labs Reviewed  CBC WITH DIFFERENTIAL/PLATELET - Abnormal; Notable for the following components:      Result Value   WBC 12.0 (*)    RBC 3.33 (*)    Hemoglobin 10.7 (*)    HCT 33.8 (*)    MCV 101.5 (*)    Neutro Abs 8.8 (*)    Eosinophils Absolute 0.9 (*)    All other components within normal limits  BASIC METABOLIC PANEL - Abnormal; Notable for the following components:   Sodium 124 (*)    Chloride 82 (*)    CO2 33 (*)    Glucose, Bld 107 (*)    Calcium 8.7 (*)    All other components within normal limits  BRAIN NATRIURETIC PEPTIDE - Abnormal; Notable for the following components:   B Natriuretic Peptide 768.0 (*)    All other components within normal limits  RESP PANEL BY RT-PCR (FLU A&B, COVID) ARPGX2  PROCALCITONIN  TROPONIN I (HIGH SENSITIVITY)  TROPONIN I (HIGH SENSITIVITY)    EKG None  Radiology DG Chest Port 1 View  Result Date: 03/07/2021 CLINICAL DATA:  Short of breath since this morning. History of COPD. EXAM: PORTABLE CHEST 1 VIEW COMPARISON:  Chest radiograph, 02/22/2021 and older studies. Chest CT, 02/22/2021. FINDINGS: Cardiac silhouette is normal in size. No mediastinal or hilar masses. Lungs are hyperexpanded. There are heterogeneously  thickened interstitial and bronchial markings without change from the prior exam. No lung consolidation. No evidence of pulmonary edema. No pleural effusion or pneumothorax. Skeletal structures are demineralized but grossly intact. IMPRESSION: 1. No acute cardiopulmonary disease. 2. Advanced COPD. Electronically Signed   By: Lajean Manes M.D.   On: 03/07/2021 11:08    Procedures Procedures   Medications Ordered in ED Medications  ipratropium-albuterol (DUONEB) 0.5-2.5 (3) MG/3ML nebulizer solution 3 mL (3 mLs Nebulization Given 03/07/21 1102)  methylPREDNISolone sodium succinate (SOLU-MEDROL) 125 mg/2 mL injection 60 mg (60 mg Intravenous Given 03/07/21 1100)  oxyCODONE-acetaminophen (PERCOCET/ROXICET) 5-325 MG per tablet 1 tablet (1 tablet Oral Given 03/07/21 1100)    ED Course  I have reviewed the triage vital signs and the nursing notes.  Pertinent labs & imaging results that were available during my care of the patient were reviewed by me and considered in my medical decision making (see chart for details).    MDM Rules/Calculators/A&P                          Patient found to be hyponatremic.  Sodium was 130 just a few days ago now appears to be 124 etiology unclear.  Patient given multiple treatment breathing treatments with Solu-Medrol with some improvement but persistent hypoxia and increased oxygen requirement.  Will Be brought into the medical team. Final Clinical Impression(s) / ED Diagnoses Final diagnoses:  COPD exacerbation ([place])  Hyponatremia    Rx / DC Orders ED Discharge Orders    None       Luna Fuse, MD 03/07/21 1308

## 2021-03-08 DIAGNOSIS — Z79891 Long term (current) use of opiate analgesic: Secondary | ICD-10-CM

## 2021-03-08 DIAGNOSIS — J9621 Acute and chronic respiratory failure with hypoxia: Secondary | ICD-10-CM | POA: Diagnosis not present

## 2021-03-08 DIAGNOSIS — E222 Syndrome of inappropriate secretion of antidiuretic hormone: Secondary | ICD-10-CM | POA: Diagnosis not present

## 2021-03-08 DIAGNOSIS — J441 Chronic obstructive pulmonary disease with (acute) exacerbation: Secondary | ICD-10-CM

## 2021-03-08 DIAGNOSIS — R64 Cachexia: Secondary | ICD-10-CM | POA: Diagnosis not present

## 2021-03-08 DIAGNOSIS — L97929 Non-pressure chronic ulcer of unspecified part of left lower leg with unspecified severity: Secondary | ICD-10-CM

## 2021-03-08 DIAGNOSIS — I771 Stricture of artery: Secondary | ICD-10-CM

## 2021-03-08 DIAGNOSIS — L97919 Non-pressure chronic ulcer of unspecified part of right lower leg with unspecified severity: Secondary | ICD-10-CM

## 2021-03-08 LAB — CBC WITH DIFFERENTIAL/PLATELET
Abs Immature Granulocytes: 0.02 10*3/uL (ref 0.00–0.07)
Basophils Absolute: 0 10*3/uL (ref 0.0–0.1)
Basophils Relative: 0 %
Eosinophils Absolute: 0 10*3/uL (ref 0.0–0.5)
Eosinophils Relative: 0 %
HCT: 30.1 % — ABNORMAL LOW (ref 36.0–46.0)
Hemoglobin: 9.8 g/dL — ABNORMAL LOW (ref 12.0–15.0)
Immature Granulocytes: 0 %
Lymphocytes Relative: 10 %
Lymphs Abs: 0.6 10*3/uL — ABNORMAL LOW (ref 0.7–4.0)
MCH: 31.4 pg (ref 26.0–34.0)
MCHC: 32.6 g/dL (ref 30.0–36.0)
MCV: 96.5 fL (ref 80.0–100.0)
Monocytes Absolute: 0.2 10*3/uL (ref 0.1–1.0)
Monocytes Relative: 4 %
Neutro Abs: 4.9 10*3/uL (ref 1.7–7.7)
Neutrophils Relative %: 86 %
Platelets: 286 10*3/uL (ref 150–400)
RBC: 3.12 MIL/uL — ABNORMAL LOW (ref 3.87–5.11)
RDW: 11.5 % (ref 11.5–15.5)
WBC: 5.7 10*3/uL (ref 4.0–10.5)
nRBC: 0 % (ref 0.0–0.2)

## 2021-03-08 LAB — BASIC METABOLIC PANEL
Anion gap: 6 (ref 5–15)
BUN: 9 mg/dL (ref 8–23)
CO2: 33 mmol/L — ABNORMAL HIGH (ref 22–32)
Calcium: 8.7 mg/dL — ABNORMAL LOW (ref 8.9–10.3)
Chloride: 84 mmol/L — ABNORMAL LOW (ref 98–111)
Creatinine, Ser: 0.51 mg/dL (ref 0.44–1.00)
GFR, Estimated: >60 mL/min (ref >60)
Glucose, Bld: 148 mg/dL — ABNORMAL HIGH (ref 70–99)
Potassium: 4.9 mmol/L (ref 3.5–5.1)
Sodium: 123 mmol/L — ABNORMAL LOW (ref 135–145)

## 2021-03-08 LAB — OSMOLALITY, URINE: Osmolality, Ur: 234 mOsm/kg — ABNORMAL LOW (ref 300–900)

## 2021-03-08 LAB — OSMOLALITY: Osmolality: 265 mOsm/kg — ABNORMAL LOW (ref 275–295)

## 2021-03-08 LAB — SODIUM, URINE, RANDOM: Sodium, Ur: 47 mmol/L

## 2021-03-08 MED ORDER — PROSOURCE PLUS PO LIQD
30.0000 mL | Freq: Two times a day (BID) | ORAL | Status: DC
  Administered 2021-03-08 – 2021-03-09 (×2): 30 mL via ORAL
  Filled 2021-03-08: qty 30

## 2021-03-08 MED ORDER — ADULT MULTIVITAMIN W/MINERALS CH
1.0000 | ORAL_TABLET | Freq: Every day | ORAL | Status: DC
  Administered 2021-03-08 – 2021-03-09 (×2): 1 via ORAL
  Filled 2021-03-08 (×2): qty 1

## 2021-03-08 MED ORDER — FLUTICASONE PROPIONATE 50 MCG/ACT NA SUSP
1.0000 | Freq: Two times a day (BID) | NASAL | Status: DC
  Administered 2021-03-08 – 2021-03-09 (×2): 1 via NASAL
  Filled 2021-03-08: qty 16

## 2021-03-08 MED ORDER — OXYCODONE-ACETAMINOPHEN 5-325 MG PO TABS
2.0000 | ORAL_TABLET | Freq: Three times a day (TID) | ORAL | Status: DC | PRN
Start: 2021-03-08 — End: 2021-03-09
  Administered 2021-03-08 – 2021-03-09 (×2): 2 via ORAL
  Filled 2021-03-08 (×3): qty 2

## 2021-03-08 MED ORDER — GUAIFENESIN ER 600 MG PO TB12
600.0000 mg | ORAL_TABLET | Freq: Two times a day (BID) | ORAL | Status: DC
  Administered 2021-03-08 – 2021-03-09 (×3): 600 mg via ORAL
  Filled 2021-03-08 (×3): qty 1

## 2021-03-08 MED ORDER — IPRATROPIUM-ALBUTEROL 0.5-2.5 (3) MG/3ML IN SOLN
3.0000 mL | Freq: Four times a day (QID) | RESPIRATORY_TRACT | Status: AC
  Administered 2021-03-08 – 2021-03-09 (×4): 3 mL via RESPIRATORY_TRACT
  Filled 2021-03-08 (×4): qty 3

## 2021-03-08 NOTE — Progress Notes (Signed)
Initial Nutrition Assessment  DOCUMENTATION CODES:   Severe malnutrition in context of chronic illness  INTERVENTION:  Mighty Shake po BID, each supplement provides 330 kcal and 9 grams of protein  Magic cup BID with meals, each supplement provides 290 kcal and 9 grams of protein  92m Prosource Plus po BID, each supplement provides 100 kcals and 15 grams of protein  MVI with minerals daily  NUTRITION DIAGNOSIS:   Severe Malnutrition related to chronic illness (COPD) as evidenced by severe muscle depletion,severe fat depletion  GOAL:   Patient will meet greater than or equal to 90% of their needs  MONITOR:   PO intake,Supplement acceptance,Labs,Weight trends,I & O's,Skin  REASON FOR ASSESSMENT:   Consult Assessment of nutrition requirement/status  ASSESSMENT:   Pt with a PMH of chronic hypoxic and hypercapnic respiratory failure 2/2 COPD on chronic 3L home O2 with multiple recent admissions for COPD (most recently 4/2-4/4), chronic SIADH, Afib, and pulmonary cachexia admitted with hyponatremia and COPD exacerbation.  Pt reports her appetite has been improved lately outside of on the day of admission due to some mild nausea. Pt states she drinks 3 139fz bottles of water per day. Note pt does not want Ensure. Will trial additional supplements.   No PO intake documented.   Reviewed weight history. No significant weight changes noted, though pt is underweight -- BMI 13.76.   WOBald[place]urse following and notes pt with chronic full thickness medial malleolar ulcerations.  Reviewed weight history.   UOP: 1.3L x24 hours  Medications: deltasone Labs: Na 123 (L)  NUTRITION - FOCUSED PHYSICAL EXAM:  Flowsheet Row Most Recent Value  Orbital Region Severe depletion  Upper Arm Region Severe depletion  Thoracic and Lumbar Region Severe depletion  Buccal Region Severe depletion  Temple Region Severe depletion  Clavicle Bone Region Severe depletion  Clavicle and Acromion Bone  Region Severe depletion  Scapular Bone Region Severe depletion  Dorsal Hand Severe depletion  Patellar Region Severe depletion  Anterior Thigh Region Severe depletion  Posterior Calf Region Severe depletion  Edema (RD Assessment) None  Hair Reviewed  Eyes Reviewed  Mouth Reviewed  Skin Reviewed  Nails Reviewed       Diet Order:   Diet Order            Diet regular Room service appropriate? Yes; Fluid consistency: Thin; Fluid restriction: 1200 mL Fluid  Diet effective now                 EDUCATION NEEDS:   No education needs have been identified at this time  Skin:  Skin Assessment: Skin Integrity Issues: Skin Integrity Issues:: Stage I Stage I: buttocks  Last BM:  PTA  Height:   Ht Readings from Last 1 Encounters:  03/07/21 _0  (1.778 m)    Weight:   Wt Readings from Last 1 Encounters:  03/07/21 43.5 kg   BMI:  Body mass index is 13.76 kg/m.  Estimated Nutritional Needs:   Kcal:  1500-1700  Protein:  75-85 grams  Fluid:  >1.5L    AmLarkin InaMS, RD, LDN RD pager number and weekend/on-call pager number located in [facility]

## 2021-03-08 NOTE — Progress Notes (Signed)
MD notified of abnormal AM labs

## 2021-03-08 NOTE — Consult Note (Signed)
WOC Nurse Consult Note: Reason for Consult:Chronic full thickness medial malleolar ulcerations.  Clinical CEAP 6. Photo taken in ED upon arrival is appreciated. Wound type: Venous insufficiency Pressure Injury POA:N/A Measurement:To be obtained by Bedside RN with first dressing application today.   Wound bed:red, moist Drainage (amount, consistency, odor) Small serous Periwound: fragile.  Minimal hemosiderin staining and edema. Dressing procedure/placement/frequency: I have provided Nursing with guidance for the care of these chronic wounds with the goal of management and prevention of exacerbation. We will use and antimicrobial nonadherent (xeroform) topped with dry gauze and secured by wrapping from toes to knee with Kerlix.  This will be topped with application of an ACE bandage applied in a similar manner for approximate compression of 15-28mHg.  Feet will be elevated while in house,m either in bed or chair.  Pressure redistribution heel boots are provided, as well as a pressure redistribution chair cushion and a sacral silicone foam bordered dressing for pressure injury prophylaxis.  Patient to return to her previous management program upon discharge to community.  [facility] Prair[facility]ursing team will not follow, but will remain available to this patient, the nursing and medical teams.  Please re-consult if needed. Thanks, LMaudie Flakes MSN, RN, [place] Pager# [phone]

## 2021-03-08 NOTE — Discharge Instructions (Signed)

## 2021-03-08 NOTE — Progress Notes (Addendum)
Subjective:   No acute overnight events.  Reports feeling better today, but still having some congestion. Otherwise, she is not as short of breath as she was yesterday. She is hungry and having lunch at this time.   Asking for dressing to be applied to her chronic ankle wounds, which I assured nursing staff will apply.  Objective:  Vital signs in last 24 hours: Vitals:   03/08/21 0602 03/08/21 0854 03/08/21 0900 03/08/21 1347  BP: 137/75 123/71    Pulse: 72     Resp: 16 18    Temp: 98.4 F (36.9 C) 98.7 F (37.1 C)    TempSrc: Oral Oral    SpO2: 97% 96% 95% 99%  Weight:      Height:       General: Cachectic, chronically ill-appearing elderly woman, sitting up in bed eating lunch, NAD. HENT: Nasal congestion Pulmonary: Prolonged expiratory phase with mild wheezing diffusely, respiratory effort mildly labored, no acute respiratory distress noted, currently back on home 3 L oxygen via nasal cannula with oxygen saturations > 95%. MSK: 1+ lower extremity nonpitting edema bilaterally Neurological: Alert and oriented to person place and time. Skin: Chronic bilateral medial malleoli are ulcerations with localized erythema, unchanged from yesterday.  Assessment/Plan:  Principal Problem:   Hyponatremia Active Problems:   Protein-calorie malnutrition ([place])   Generalized anxiety disorder   Lower extremity ulceration ([facility])   COPD exacerbation ([facility])  Kristen [name] is a 75 year old woman with past medical history of chronic hypoxic and hypercapnic respiratory failure secondary to severe COPD on chronic 3 L home oxygen with multiple admissions for COPD in the last 6 months, paroxysmal A. fib, chronic protein calorie malnutrition, pulmonary cachexia presenting to [facility] ED with acute on chronic hypoxic respiratory failure secondary to COPD exacerbation and acute on chronic hyponatremia secondary to chronic SIADH.  Acute on chronic hypoxic respiratory failure Pulmonary  cachexia Severe COPD on 3 L home oxygen Patient reports improvement today compared to yesterday terms of respiratory status, although she is still having some congestion possibly from concurrent seasonal allergies. She is oxygenating well on home 3 L. On exam she has mild diffuse end expiratory wheezing and a productive cough that is unchanged from yesterday. Although she reports improvement in respiratory status, her overall long-term clinical picture is guarded given severe COPD with multiple exacerbations in the past 6 months. She will likely benefit from initiation of goals of care conversations and I will speak with her tomorrow about whether or not she is willing to discuss this with palliative care. -Day 1 of 5 of prednisone 40 mg daily -DuoNebs every 6 hours -Flonase daily -Oxygen supplementation to maintain oxygen saturations between 88 to 92% -Continue home inhaler regimen: Incruse, Dulera -Added guaifenesin to help with mucolysis  Acute on chronic hyponatremia Chronic SIADH Baseline sodium appears to be between 128-130. Sodium 124 on admission. Today, sodium 123. Serum osmolality 265, urine sodium 47, urine osmolality 234, which is consistent with findings of SIADH.  Patient likely has SIADH from either chronic opioid therapy or severe pulmonary cachexia from her severe COPD. In the context of SIADH, decreased fluid restriction to 800 mL daily. 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 Discussed with nursing staff to apply dressings as recommended by wound care.  Continue Percocet 5-325 mg every 8 hours as needed.  Paroxysmal A-fib Continue home metoprolol succinate 50 mg daily and  home rivaroxaban 15 mg daily.   Prior to Admission Living Arrangement: Home Anticipated Discharge Location: To be decided Barriers to Discharge: Continued  medical management Dispo: To be decided   Virl Axe, MD 03/08/2021, 2:39 PM Pager: [phone] After 5pm on weekdays and 1pm on weekends: On Call pager [phone]

## 2021-03-09 DIAGNOSIS — J9621 Acute and chronic respiratory failure with hypoxia: Secondary | ICD-10-CM | POA: Diagnosis not present

## 2021-03-09 DIAGNOSIS — E43 Unspecified severe protein-calorie malnutrition: Secondary | ICD-10-CM | POA: Insufficient documentation

## 2021-03-09 DIAGNOSIS — R64 Cachexia: Secondary | ICD-10-CM | POA: Diagnosis not present

## 2021-03-09 DIAGNOSIS — E222 Syndrome of inappropriate secretion of antidiuretic hormone: Secondary | ICD-10-CM | POA: Diagnosis not present

## 2021-03-09 DIAGNOSIS — J441 Chronic obstructive pulmonary disease with (acute) exacerbation: Secondary | ICD-10-CM | POA: Diagnosis not present

## 2021-03-09 LAB — CBC WITH DIFFERENTIAL/PLATELET
Abs Immature Granulocytes: 0.03 10*3/uL (ref 0.00–0.07)
Basophils Absolute: 0 10*3/uL (ref 0.0–0.1)
Basophils Relative: 0 %
Eosinophils Absolute: 0.1 10*3/uL (ref 0.0–0.5)
Eosinophils Relative: 1 %
HCT: 29.7 % — ABNORMAL LOW (ref 36.0–46.0)
Hemoglobin: 9.8 g/dL — ABNORMAL LOW (ref 12.0–15.0)
Immature Granulocytes: 0 %
Lymphocytes Relative: 24 %
Lymphs Abs: 1.8 10*3/uL (ref 0.7–4.0)
MCH: 31.9 pg (ref 26.0–34.0)
MCHC: 33 g/dL (ref 30.0–36.0)
MCV: 96.7 fL (ref 80.0–100.0)
Monocytes Absolute: 0.7 10*3/uL (ref 0.1–1.0)
Monocytes Relative: 9 %
Neutro Abs: 4.8 10*3/uL (ref 1.7–7.7)
Neutrophils Relative %: 66 %
Platelets: 285 10*3/uL (ref 150–400)
RBC: 3.07 MIL/uL — ABNORMAL LOW (ref 3.87–5.11)
RDW: 11.8 % (ref 11.5–15.5)
WBC: 7.4 10*3/uL (ref 4.0–10.5)
nRBC: 0 % (ref 0.0–0.2)

## 2021-03-09 LAB — BASIC METABOLIC PANEL
Anion gap: 4 — ABNORMAL LOW (ref 5–15)
BUN: 7 mg/dL — ABNORMAL LOW (ref 8–23)
CO2: 35 mmol/L — ABNORMAL HIGH (ref 22–32)
Calcium: 8.8 mg/dL — ABNORMAL LOW (ref 8.9–10.3)
Chloride: 89 mmol/L — ABNORMAL LOW (ref 98–111)
Creatinine, Ser: 0.66 mg/dL (ref 0.44–1.00)
GFR, Estimated: >60 mL/min (ref >60)
Glucose, Bld: 93 mg/dL (ref 70–99)
Potassium: 3.8 mmol/L (ref 3.5–5.1)
Sodium: 128 mmol/L — ABNORMAL LOW (ref 135–145)

## 2021-03-09 MED ORDER — PREDNISONE 20 MG PO TABS: 40.0000 mg | ORAL_TABLET | Freq: Every day | ORAL | 0 refills | Status: DC

## 2021-03-09 NOTE — Progress Notes (Signed)
Internal Medicine Clinic Attending  Case discussed with Dr. Marva Panda  At the time of the visit.  We reviewed the resident's history and exam and pertinent patient test results.  I agree with the assessment, diagnosis, and plan of care documented in the resident's note.

## 2021-03-09 NOTE — Progress Notes (Signed)
PT Cancellation Note  Patient Details Name: Kristen [name] MRN: [id number] DOB: 25-Jan-1946   Cancelled Treatment:    Reason Eval/Treat Not Completed: Patient declined, no reason specified very politely asks [name] to return in about an hour- she is feeling nauseous and just got her medicine, reports she will participate after medications kick in. Plan to return later this morning.   Windell Norfolk, DPT, PN1   Supplemental Physical Therapist [facility]    Pager [phone] Acute Rehab Office [phone]

## 2021-03-09 NOTE — Evaluation (Signed)
Physical Therapy Evaluation Patient Details Name: Kristen [name] MRN: [id number] DOB: 18-Apr-1946 Today's Date: 03/09/2021    History of Present Illness  Kristen [name] is a 75 y.o. female admitted on 03/07/21 with acute on chronic hypoxic respiratory failure secondary to COPD. 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 LEs, and cellulitis.    Clinical Impression  Pt received in bed, cooperative and pleasant. Pt moving well, steady without AD with no overt LOB. However, may benefit from 1 UE support for longer ambulatory distances. SPO2 > 90% on 3L, which is pt's baseline. Education provided on energy conservation. Suspect pt is close to baseline and pt does not feel she will need further PT following discharge. Pt left in bed with all needs met and call bell within reach. Will continue to monitor acutely.    Follow Up Recommendations No PT follow up    Equipment Recommendations  None recommended by PT    Recommendations for Other Services       Precautions / Restrictions Precautions Precautions: Fall Precaution Comments: monitor O2 Restrictions Weight Bearing Restrictions: No      Mobility  Bed Mobility Overal bed mobility: Modified Independent                  Transfers Overall transfer level: Needs assistance Equipment used: Rolling walker (2 wheeled);None Transfers: Sit to/from Stand Sit to Stand: Supervision Stand pivot transfers: Supervision       General transfer comment: Trialed RW, but pt steady without AD  Ambulation/Gait Ambulation/Gait assistance: Min guard Gait Distance (Feet): 90 Feet Assistive device:  (pushed dinamap used to monitor O2) Gait Pattern/deviations: Step-through pattern;Narrow base of support;Trunk flexed Gait velocity: decreased   General Gait Details: min guard for safety  Stairs             Wheelchair Mobility    Modified Rankin (Stroke Patients Only)       Balance Overall balance assessment: Needs assistance   Sitting balance-Leahy Scale: Good       Standing balance-Leahy Scale: Fair Standing balance comment: Steady without AD                            Pertinent Vitals/Pain Pain Assessment: Faces Faces Pain Scale: Hurts a little bit Pain Location: generalized, nausea Pain Descriptors / Indicators: Grimacing;Discomfort Pain Intervention(s): Monitored during session    Home Living Family/patient expects to be discharged to:: Private residence Living Arrangements: Other relatives Available Help at Discharge: Family;Available 24 hours/day Type of Home: House Home Access: Ramped entrance     Home Layout: One level Home Equipment: Cane - single point;Shower seat;Wheelchair - [employer] - 2 wheels;Walker - 4 wheels Additional Comments: equipment was brother in [place], in storage at sister's home    Prior Function Level of Independence: Independent         Comments: Pt reports she is independent without use of DME or AE.  She reports she stands to shower, and that she ambulates around grocery store.  Uses 3L 02 at home     Hand Dominance   Dominant Hand: Right    Extremity/Trunk Assessment   Upper Extremity Assessment Upper Extremity Assessment: Defer to OT evaluation    Lower Extremity Assessment Lower Extremity Assessment: Generalized weakness    Cervical / Trunk Assessment Cervical / Trunk Assessment: Kyphotic  Communication   Communication:  No difficulties  Cognition Arousal/Alertness: Awake/alert Behavior During Therapy: WFL for tasks assessed/performed Overall Cognitive Status: Within Functional Limits for tasks assessed                                        General Comments General comments (skin integrity, edema, etc.): VSS on RA    Exercises     Assessment/Plan    PT Assessment Patient  needs continued PT services  PT Problem List Decreased strength;Decreased knowledge of use of DME;Decreased activity tolerance;Decreased balance;Decreased mobility       PT Treatment Interventions DME instruction;Balance training;Gait training;Neuromuscular re-education;Stair training;Patient/family education;Functional mobility training;Therapeutic activities;Therapeutic exercise    PT Goals (Current goals can be found in the Care Plan section)  Acute Rehab PT Goals Patient Stated Goal: to go home today PT Goal Formulation: With patient Time For Goal Achievement: 03/30/21 Potential to Achieve Goals: Good    Frequency Min 3X/week   Barriers to discharge        Co-evaluation               AM-PAC PT "6 Clicks" Mobility  Outcome Measure Help needed turning from your back to your side while in a flat bed without using bedrails?: None Help needed moving from lying on your back to sitting on the side of a flat bed without using bedrails?: None Help needed moving to and from a bed to a chair (including a wheelchair)?: A Little Help needed standing up from a chair using your arms (e.g., wheelchair or bedside chair)?: A Little Help needed to walk in hospital room?: A Little Help needed climbing 3-5 steps with a railing? : A Little 6 Click Score: 20    End of Session Equipment Utilized During Treatment: Gait belt;Oxygen Activity Tolerance: Patient tolerated treatment well Patient left: in bed;with call bell/phone within reach;with bed alarm set Nurse Communication: Mobility status PT Visit Diagnosis: Unsteadiness on feet (R26.81);Muscle weakness (generalized) (M62.81)    Time:  -      Charges:              Rosita Kea, SPT

## 2021-03-09 NOTE — Discharge Summary (Addendum)
Name: Kristen [name] MRN: [id number] DOB: October 18, 1946 75 y.o. PCP: [name], MD  Date of Admission: 03/07/2021 10:19 AM Date of Discharge:  03/09/2021 Attending Physician: Angelica Pou, MD  Discharge Diagnosis: 1. Acute on chronic hypoxic respiratory failure 2/2 severe COPD  2. 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   busPIRone 10 MG tablet Commonly known as: BUSPAR Take 10 mg by mouth daily.   Dulera 200-5 MCG/ACT Aero Generic drug: mometasone-formoterol Inhale 1 puff into the lungs in the morning and at bedtime.   fluticasone 50 MCG/ACT nasal spray Commonly known as: FLONASE PLACE 1 SPRAY INTO BOTH NOSTRILS DAILY AS NEEDED FOR ALLERGIES OR RHINITIS.   Incruse Ellipta 62.5 MCG/INH Aepb Generic drug: umeclidinium bromide INHALE 1 PUFF BY MOUTH EVERY DAY What changed: See the new instructions.   ipratropium-albuterol 0.5-2.5 (3) MG/3ML Soln Commonly known as: DUONEB TAKE 3 MLS BY NEBULIZATION EVERY 6 (SIX) HOURS AS NEEDED (SHORTNESS OF BREATH/WHEEZING.).   metoprolol succinate 25 MG 24 hr tablet Commonly known as: TOPROL-XL Take 2 tablets (50 mg total) by mouth daily. What changed: Another medication with the same name was removed. Continue taking this medication, and follow the directions you see here.   mirtazapine 15 MG tablet Commonly known as: REMERON TAKE 1 TABLET BY MOUTH EVERYDAY AT BEDTIME What changed:   See the  new instructions.  Another medication with the same name was removed. Continue taking this medication, and follow the directions you see here.   multivitamin with minerals Tabs tablet Take 1 tablet by mouth daily.   ondansetron 4 MG tablet Commonly known as: ZOFRAN Take 1 tablet (4 mg total) by mouth daily as needed for nausea or vomiting.   oxyCODONE-acetaminophen 10-325 MG tablet Commonly known as: Percocet Take 1 tablet by mouth every 8 (eight) hours as needed for pain.   OXYGEN Inhale 3 L/min into the lungs.   predniSONE 20 MG tablet Commonly known as: DELTASONE Take 2 tablets (40 mg total) by mouth daily with breakfast. Start taking on: March 10, 2021   Xarelto 15 MG Tabs tablet Generic drug: Rivaroxaban TAKE 1 TABLET (15 MG TOTAL) BY MOUTH DAILY. What changed: how much to take            Discharge Care Instructions  (From admission, onward)         Start     Ordered   03/09/21 0000  Discharge wound care:       Comments: Change dressings on your ankles daily as you normally do at home.   03/09/21 1139         Disposition and follow-up:   [name] was discharged from [facility] in [place] condition.  At the hospital follow up visit please address:  1. Acute on chronic hypoxic respiratory failure 2/2 severe COPD. Back on home oxygen therapy of 3L [place]. Very severe COPD, would benefit from pulmonology referral which  I will defer to PCP. Discharged on home duonebs, flonase, incruse, dulera. Will need dedicated conversation for care planning going forward but will need to speak with her daughter first. Will follow up in [facility] in 1 week.  2. 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 Chronic opioid therapy for pain from chronic ankle ulcerations. Around baseline sodium today. Discharged with instructions to continue fluid restrictions with 1.2-1.5L every day.  3.  Labs / imaging needed at time of follow-up: CBC,  BMP  4.  Pending labs/ test needing follow-up: None   Follow-up Appointments:   Hospital Course by problem list: 1. Acute on chronic hypoxic respiratory failure 2/2 severe COPD. Presented with acute onset of dyspnea and found by EMS with spO2 64% on home 3L [place]. Received solumedrol and duonebs in the ED. Had mild diffuse end-expiratory wheezing on exam with productive cough. CXR without focal infiltrate. Has had several admission for COPD exacerbations in the past 6 months, most recently on 02/22/2021. Started on duonebs q6h, flonase daily, incruse, dulera, and added guaifenasin for mucolysis. Also received 2 days of prednisone therapy. She is back on home O2 requirement and shortness of breath has significantly improved. Will discharge today with continuation of home inhalers, 3-day supply of prednisone 20m (to complete 5-day course), and her home oxygen. [facility]. She will likely need outpatient referral to pulmonology to help optimize COPD medications.  2. 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 Baseline sodium 128-130. On admission, sodium 124. Serum Osm 265, urine sodium 47, urine Osm 234 which is consistent with findings of SIADH, likely from chronic opioid therapy or severe pulmonary cachexia from severe COPD. Started on fluid restriction. Sodium 128 today, which is around her baseline.  Discharge Exam:   BP 132/75 (BP Location: Right Arm)   Pulse 69   Temp 98.2 F (36.8 C) (Oral)   Resp 16   Ht _0  (1.778 m)   Wt 43.5 kg   SpO2 96%   BMI 13.76 kg/m  Discharge exam:  Physical Exam Constitutional:      Comments: Cachectic, chronically ill-appearing woman sitting up in bed, NAD.  Eyes:     Extraocular Movements: Extraocular movements intact.     Pupils: Pupils are equal, round, and reactive to light.  Cardiovascular:     Rate and Rhythm: Normal rate and regular rhythm.     Pulses: Normal pulses.      Heart sounds: Normal heart sounds. No murmur heard. No friction rub. No gallop.   Pulmonary:     Effort: Pulmonary effort is normal. No accessory muscle usage or respiratory distress.     Breath sounds: No wheezing, rhonchi or rales.     Comments: Prolonged expiratory phase with no adventitious sounds noted on exam bilaterally.  Abdominal:     General: Bowel sounds are normal.     Palpations: Abdomen is soft.     Tenderness: There is no abdominal tenderness.  Musculoskeletal:        General: Normal range of motion.     Cervical back: Normal range of motion.  Skin:    General: Skin is warm and dry.  Neurological:     General: No focal deficit present.     Mental Status: She is alert and oriented to person, place, and time.  Psychiatric:        Mood and Affect: Mood normal.  Behavior: Behavior normal.     Pertinent Labs, Studies, and Procedures:  CBC Latest Ref Rng & Units 03/09/2021 03/08/2021 03/07/2021  WBC 4.0 - 10.5 K/uL 7.4 5.7 12.0(H)  Hemoglobin 12.0 - 15.0 g/dL 9.8(L) 9.8(L) 10.7(L)  Hematocrit 36.0 - 46.0 % 29.7(L) 30.1(L) 33.8(L)  Platelets 150 - 400 K/uL 285 286 305   BMP Latest Ref Rng & Units 03/09/2021 03/08/2021 03/07/2021  Glucose 70 - 99 mg/dL 93 148(H) 107(H)  BUN 8 - 23 mg/dL 7(L) 9 11  Creatinine 0.44 - 1.00 mg/dL 0.66 0.51 0.64  BUN/Creat Ratio 12 - 28 - - -  Sodium 135 - 145 mmol/L 128(L) 123(L) 124(L)  Potassium 3.5 - 5.1 mmol/L 3.8 4.9 4.5  Chloride 98 - 111 mmol/L 89(L) 84(L) 82(L)  CO2 22 - 32 mmol/L 35(H) 33(H) 33(H)  Calcium 8.9 - 10.3 mg/dL 8.8(L) 8.7(L) 8.7(L)   Serum Osm 265 Urine Sodium 47 Urine Osm 234   Discharge Instructions: Discharge Instructions    Call MD for:  difficulty breathing, headache or visual disturbances   Complete by: As directed    Call MD for:  extreme fatigue   Complete by: As directed    Call MD for:  hives   Complete by: As directed    Call MD for:  persistant dizziness or light-headedness   Complete by: As  directed    Call MD for:  persistant nausea and vomiting   Complete by: As directed    Call MD for:  redness, tenderness, or signs of infection (pain, swelling, redness, odor or green/yellow discharge around incision site)   Complete by: As directed    Call MD for:  severe uncontrolled pain   Complete by: As directed    Call MD for:  temperature >100.4   Complete by: As directed    Diet - low sodium heart healthy   Complete by: As directed    Discharge instructions   Complete by: As directed    Ms Kristen [name], it was a pleasure taking care of you during your stay here. You came in for an exacerbation of your COPD and were treated for it with inhalers and steroids.  Please take note of the following:  1. Please continue using your inhalers as prescribed at home. Also continue using your home oxygen.  2. I prescribed you steroids to take at home. Please take 2 tablets every morning with breakfast for the next 3 days (starting from tomorrow).  3. Please restrict how much fluid you drink every day to about 1.2-1.5 liters at home.  4. I have scheduled you for a clinic appointment in 1 week. Someone will call you to set up the date and time for the appointment that works best for you.   Discharge wound care:   Complete by: As directed    Change dressings on your ankles daily as you normally do at home.   Increase activity slowly   Complete by: As directed        Signed: Virl Axe, MD 03/09/2021, 11:39 AM   Pager: [phone]

## 2021-03-09 NOTE — Evaluation (Signed)
Occupational Therapy Evaluation Patient Details Name: Kristen [name] MRN: [id number] DOB: 1945-12-30 Today's Date: 03/09/2021    History of Present Illness This 75 y.o. female admitted with acute on chronic hypoxic respiratory failure secondary to COPD and acute on chronic hyponatremia - chronic SIADH, and chronic non healing LE ulcerations. 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 LEs, cellulitis   Clinical Impression   Pt admitted with the above diagnosis and demonstrates the below listed deficits.  She requires supervision/set up for ADLs and rest breaks as she fatigues quickly with activity.  DOE 4/4 with ambulation to and from BR on 3L 02 with Sp02 90%, HR 88.  She reports she is more dyspneic than normal, but not far off her baseline.  She reports she lives with her sister, who can assist her at discharge, and that she was independent without DME/AD PTA.  She insists she is discharging home today, and insists she is close to her baseline.  Have recommended she use shower seat at home for safety and to conserve energy.  No further OT recommended.  OT will sign off due to likely discharge today.     Follow Up Recommendations  No OT follow up;Supervision - Intermittent    Equipment Recommendations  None recommended by OT (has all needed DME, but doesn't use)    Recommendations for Other Services       Precautions / Restrictions        Mobility Bed Mobility Overal bed mobility: Modified Independent                  Transfers Overall transfer level: Needs assistance Equipment used: None Transfers: Sit to/from Stand;Stand Pivot Transfers Sit to Stand: Supervision Stand pivot transfers: Supervision            Balance Overall balance assessment: Mild deficits observed, not formally tested                                         ADL either performed or assessed with clinical judgement   ADL Overall  ADL's : Needs assistance/impaired Eating/Feeding: Independent   Grooming: Wash/dry hands;Wash/dry face;Oral care;Brushing hair;Supervision/safety;Standing   Upper Body Bathing: Set up;Sitting   Lower Body Bathing: Supervison/ safety;Sit to/from stand   Upper Body Dressing : Set up;Sitting   Lower Body Dressing: Supervision/safety;Sit to/from stand   Toilet Transfer: Supervision/safety;Ambulation;Comfort height toilet;Grab bars   Toileting- Clothing Manipulation and Hygiene: Supervision/safety;Sit to/from stand       Functional mobility during ADLs: Supervision/safety General ADL Comments: Pt with DOE 4/4 with ambulation to BR and back to bed on 3L 02.  Sp02 90%, HR 88     Vision Patient Visual Report: No change from baseline       Perception     Praxis      Pertinent Vitals/Pain Pain Assessment: Faces Faces Pain Scale: Hurts little more Pain Location: generalized Pain Descriptors / Indicators: Grimacing Pain Intervention(s): Monitored during session     Hand Dominance Right   Extremity/Trunk Assessment Upper Extremity Assessment Upper Extremity Assessment: Generalized weakness   Lower Extremity Assessment Lower Extremity Assessment: Generalized weakness   Cervical / Trunk Assessment Cervical / Trunk Assessment: Kyphotic   Communication Communication Communication: No difficulties   Cognition Arousal/Alertness: Awake/alert Behavior During Therapy: WFL for tasks assessed/performed Overall Cognitive Status: Within Functional Limits for tasks assessed  General Comments       Exercises     Shoulder Instructions      Home Living Family/patient expects to be discharged to:: Private residence Living Arrangements: Other relatives Available Help at Discharge: Family;Available 24 hours/day Type of Home: House Home Access: Ramped entrance     Home Layout: One level     Bathroom Shower/Tub: Emergency planning/management officer: Standard Bathroom Accessibility: Yes   Home Equipment: Cane - single point;Shower seat;Wheelchair - [employer] - 2 wheels;Walker - 4 wheels   Additional Comments: equipment was brother in [place], in storage at sister's home      Prior Functioning/Environment Level of Independence: Independent        Comments: Pt reports she is independent without use of DME or AE.  She reports she stands to shower, and that she ambulates around grocery store.  Uses 3L 02 at home        OT Problem List: Decreased activity tolerance;Cardiopulmonary status limiting activity      OT Treatment/Interventions:      OT Goals(Current goals can be found in the care plan section) Acute Rehab OT Goals Patient Stated Goal: to go home today OT Goal Formulation: With patient  OT Frequency:     Barriers to D/C:            Co-evaluation              AM-PAC OT "6 Clicks" Daily Activity     Outcome Measure Help from another person eating meals?: None Help from another person taking care of personal grooming?: A Little Help from another person toileting, which includes using toliet, bedpan, or urinal?: A Little Help from another person bathing (including washing, rinsing, drying)?: A Little Help from another person to put on and taking off regular upper body clothing?: A Little Help from another person to put on and taking off regular lower body clothing?: A Little 6 Click Score: 19   End of Session Equipment Utilized During Treatment: Oxygen;Gait belt Nurse Communication: Mobility status  Activity Tolerance: Patient limited by fatigue Patient left: in bed;with call bell/phone within reach;with bed alarm set  OT Visit Diagnosis: Muscle weakness (generalized) (M62.81)                Time: 9672-8979 OT Time Calculation (min): 29 min Charges:  OT General Charges $OT Visit: 1 Visit OT Evaluation $OT Eval Moderate Complexity: 1 Mod OT Treatments $Self Care/Home  Management : 8-22 mins  Nilsa Nutting., OTR/L Acute Rehabilitation Services Pager [phone] Office [phone]   Lucille Passy M 03/09/2021, 10:06 AM

## 2021-03-18 ENCOUNTER — Other Ambulatory Visit: Payer: Self-pay | Admitting: Student

## 2021-03-30 ENCOUNTER — Other Ambulatory Visit: Payer: Self-pay | Admitting: Internal Medicine

## 2021-03-31 ENCOUNTER — Other Ambulatory Visit: Payer: Self-pay

## 2021-03-31 DIAGNOSIS — R110 Nausea: Secondary | ICD-10-CM

## 2021-03-31 MED ORDER — FLUTICASONE PROPIONATE 50 MCG/ACT NA SUSP: 1.0000 | Freq: Every day | NASAL | 1 refills | Status: AC | PRN

## 2021-03-31 MED ORDER — ONDANSETRON HCL 4 MG PO TABS: 4.0000 mg | ORAL_TABLET | Freq: Every day | ORAL | 0 refills | Status: DC | PRN

## 2021-03-31 MED ORDER — OXYCODONE-ACETAMINOPHEN 10-325 MG PO TABS: 1.0000 | ORAL_TABLET | Freq: Three times a day (TID) | ORAL | 0 refills | Status: AC | PRN

## 2021-03-31 NOTE — Telephone Encounter (Signed)
Lst visit 04/12 Percocet last written 04/13  #90

## 2021-03-31 NOTE — Telephone Encounter (Signed)
Pt is requesting her ondansetron (ZOFRAN) 4 MG tablet,oxyCODONE-acetaminophen (PERCOCET) 10-325 MG tablet and fluticasone (FLONASE) 50 MCG/ACT nasal spray sent to  [id number]- [place], [place] - [address] Phone:  [phone] Fax:  [phone]

## 2021-04-02 ENCOUNTER — Other Ambulatory Visit: Payer: Self-pay | Admitting: Student

## 2021-04-02 ENCOUNTER — Other Ambulatory Visit: Payer: Self-pay | Admitting: Internal Medicine

## 2021-04-02 NOTE — Telephone Encounter (Signed)
Need refill on  oxyCODONE-acetaminophen (PERCOCET) 10-325 MG tablet  ;pt contact [phone] Pt said she needed a couple of extra for her leg pain   [id number]- [place], [place] - [address]

## 2021-04-02 NOTE — Telephone Encounter (Signed)
Spoke with Dr Antionette Poles refill authorized Pt instructed to take meds as prescribed and not to take extra-pt states "it wont happen again" Pharmacy aware.Despina Hidden Cassady5/11/20221:28 PM  Of note-pt given appt for next week (unable to come this week) to follow up leg pain.

## 2021-04-02 NOTE — Telephone Encounter (Signed)
Call to patient-She is requesting "early refill" on percocet States she is completely because she was taking 4 daily for a few day 2/2 leg pain per pharmacy-last refilled on 03-08-21, the earliest insurance will pay for refill is on 04/13 Anything prior to will require pt to pay out of pocket. Will send to team for review and authorize/deny early refill as appropriate.  Of note buspar refill request was attached to this request, but pt states she has "plenty" and does not need a refill at this time-buspar refill denied.

## 2021-04-07 ENCOUNTER — Other Ambulatory Visit: Payer: Self-pay | Admitting: Student

## 2021-04-07 ENCOUNTER — Other Ambulatory Visit: Payer: Self-pay | Admitting: Internal Medicine

## 2021-04-07 DIAGNOSIS — I480 Paroxysmal atrial fibrillation: Secondary | ICD-10-CM

## 2021-04-08 ENCOUNTER — Encounter: Payer: Medicare Other | Admitting: Internal Medicine

## 2021-05-05 ENCOUNTER — Ambulatory Visit (INDEPENDENT_AMBULATORY_CARE_PROVIDER_SITE_OTHER): Payer: Medicare Other | Admitting: Student

## 2021-05-05 VITALS — BP 122/69 | HR 89 | Temp 98.2°F | Ht 70.0 in | Wt 90.3 lb

## 2021-05-05 DIAGNOSIS — L97902 Non-pressure chronic ulcer of unspecified part of unspecified lower leg with fat layer exposed: Secondary | ICD-10-CM

## 2021-05-05 DIAGNOSIS — R110 Nausea: Secondary | ICD-10-CM | POA: Diagnosis not present

## 2021-05-05 DIAGNOSIS — F1190 Opioid use, unspecified, uncomplicated: Secondary | ICD-10-CM

## 2021-05-05 DIAGNOSIS — J441 Chronic obstructive pulmonary disease with (acute) exacerbation: Secondary | ICD-10-CM

## 2021-05-05 DIAGNOSIS — L03116 Cellulitis of left lower limb: Secondary | ICD-10-CM

## 2021-05-05 MED ORDER — DULERA 200-5 MCG/ACT IN AERO
1.0000 | INHALATION_SPRAY | Freq: Two times a day (BID) | RESPIRATORY_TRACT | 2 refills | Status: DC
Start: 2021-05-05 — End: 2021-07-17

## 2021-05-05 MED ORDER — OXYCODONE-ACETAMINOPHEN 10-325 MG PO TABS: 1.0000 | ORAL_TABLET | Freq: Three times a day (TID) | ORAL | 0 refills | Status: DC | PRN

## 2021-05-05 MED ORDER — CEPHALEXIN 500 MG PO CAPS: 500.0000 mg | ORAL_CAPSULE | Freq: Two times a day (BID) | ORAL | 0 refills | Status: AC

## 2021-05-05 MED ORDER — ONDANSETRON HCL 4 MG PO TABS
4.0000 mg | ORAL_TABLET | Freq: Every day | ORAL | 0 refills | Status: DC | PRN
Start: 2021-05-05 — End: 2021-06-17

## 2021-05-05 NOTE — Patient Instructions (Signed)
Ms.Glayds Brinson, it was a pleasure seeing you today!  I have ordered the following labs today:  Lab Orders  No laboratory test(s) ordered today     Tests ordered today:  None  Referrals ordered today:   Referral Orders  No referral(s) requested today     I have ordered the following medication/changed the following medications:   Stop the following medications: Medications Discontinued During This Encounter  Medication Reason   DULERA 200-5 MCG/ACT AERO Reorder   ondansetron (ZOFRAN) 4 MG tablet Reorder     Start the following medications: Meds ordered this encounter  Medications   mometasone-formoterol (DULERA) 200-5 MCG/ACT AERO    Sig: Inhale 1 puff into the lungs in the morning and at bedtime.    Dispense:  13 each    Refill:  2   ondansetron (ZOFRAN) 4 MG tablet    Sig: Take 1 tablet (4 mg total) by mouth daily as needed for nausea or vomiting.    Dispense:  30 tablet    Refill:  0   oxyCODONE-acetaminophen (PERCOCET) 10-325 MG tablet    Sig: Take 1 tablet by mouth every 8 (eight) hours as needed for pain.    Dispense:  90 tablet    Refill:  0   cephALEXin (KEFLEX) 500 MG capsule    Sig: Take 1 capsule (500 mg total) by mouth 2 (two) times daily for 5 days.    Dispense:  10 capsule    Refill:  0     Follow-up:  2 weeks     Today we discussed: Foot wounds - Please come back in two weeks for a re-evaluation of your wounds!  Please make sure to arrive 15 minutes prior to your next appointment. If you arrive late, you may be asked to reschedule.   We look forward to seeing you next time. Please call our clinic at [phone] if you have any questions or concerns. The best time to call is Monday-Friday from 9am-4pm, but there is someone available 24/7. If after hours or the weekend, call the main hospital number and ask for the Internal Medicine Resident On-Call. If you need medication refills, please notify your pharmacy one week in advance and they will send  [name] a request.  Thank you for letting us take part in your care. Wishing you the best!  Thank you, [name], MD

## 2021-05-05 NOTE — Progress Notes (Signed)
   CC: follow-up appointment  HPI:  [name] is a 75 y.o. with medical history as below presenting for regular follow-up appointment.  Please see problem-based list for further details, assessments, and plans.  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 No murmurs, rubs, gallops.  Pulm: Minimal air movement throughout. No wheezing appreciated. MSK: Thin, cachectic. Skin: On right ankle (pictured first below), 2.5 x 2.5cm ulceration without purulence or drainage or erythema. On left ankle (last two pictures below), 5 x 5.5cm ulceration with fibrinous exudate, heaped-up border, and surrounding erythema               Assessment & Plan:   See Encounters Tab for problem based charting.  Patient discussed with Dr.  Jimmye Norman

## 2021-05-06 DIAGNOSIS — L03116 Cellulitis of left lower limb: Secondary | ICD-10-CM | POA: Insufficient documentation

## 2021-05-06 NOTE — Assessment & Plan Note (Signed)
PDMP reviewed during our visit, deemed to be appropriate at this time. Will need continued monitoring given she recently was admitted for COPD exacerbation, thought to be 2/2 excessive opiate use due to dental pain.  -Percocet 10-381m every 8 hours as needed, #90 pills filled for 30 days. Patient to contact office if worsening pain despite regimen

## 2021-05-06 NOTE — Assessment & Plan Note (Signed)
Kristen [name] is presenting to clinic for her regular follow-up appointment. Unfortunately, she has required multiple hospitalizations over the last year for COPD exacerbations, most recently in April. Since this time Kristen [name] reports she's been doing pretty well, still on her home supplemental oxygen requirement of 3L. Denies any change on cough or production. She has been out of her Kristen [name] for the last couple of weeks but has been managing well without this. Can consider further referral to pulmonology for further assistance in managing her end-stage COPD if hospitalizations continue to occur. - Dulera 1 puff twice daily - Incruse Ellipta 1 puff daily - Albuterol inhaler 2 puffs every 6 hours as needed

## 2021-05-06 NOTE — Progress Notes (Signed)
Internal Medicine Clinic Attending  Case discussed with Dr. Collene Gobble  At the time of the visit.  We reviewed the resident's history and exam and pertinent patient test results.  I agree with the assessment, diagnosis, and plan of care documented in the resident's note.  It is appropriate to dedicate a future visit to her wishes for health and health care going forward.  Her weight loss due to pulmonary cachexia is progressive and she certainly has advanced stage disease.  Wound clinic evaluation is appropriate.  Wound edges are rolling in many areas, preventing appropriate epithelialization.

## 2021-05-06 NOTE — Assessment & Plan Note (Addendum)
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 She states this has been occurring over the last few days. Denies any drainage, fevers, or pain. These wounds have been chronic for Kristen [name].   Please see "lower extremity ulceration" for further evaluation for these chronic wounds.  Will prescribe short course of antibiotics for mild infection of her chronic wound. - Keflex 565m twice daily for five days - Return to clinic in two weeks for re-evaluation

## 2021-05-06 NOTE — Assessment & Plan Note (Addendum)
Ms. Couse chronic ulcers were examined today in the office. Compared with previous pictures, the left leg wound appears to have worsened. Ms. Fitting reports that she wraps these daily without much assistance. She has had some increased burning over the left wound recently (please see "Cellulitis of left lower extremity" for further details).  Given her venous insufficiency I suspect Ms. Panther would benefit further from wound care clinic assisting with this. She mentions she previously had UNNA boots and did not want to go throughout that again. Explained that wound care clinic can make the best possible plan moving forward to ensure the wounds are taken care of.  While in the office, the left leg was wrapped with silicone gel adhesive composite hydrocellular foam dressing. - Referral to wound care clinic

## 2021-05-19 NOTE — Addendum Note (Signed)
Addended by: Hulan Fray on: 05/19/2021 05:16 PM   Modules accepted: Orders

## 2021-05-24 ENCOUNTER — Other Ambulatory Visit: Payer: Self-pay | Admitting: Student

## 2021-05-24 DIAGNOSIS — I480 Paroxysmal atrial fibrillation: Secondary | ICD-10-CM

## 2021-05-27 ENCOUNTER — Inpatient Hospital Stay (HOSPITAL_COMMUNITY)
Admission: EM | Admit: 2021-05-27 | Discharge: 2021-06-17 | DRG: 871 | Disposition: A | Discharge status: Skilled Nursing Facility | Payer: Medicare Other | Attending: Internal Medicine | Admitting: Internal Medicine

## 2021-05-27 ENCOUNTER — Other Ambulatory Visit: Payer: Self-pay

## 2021-05-27 ENCOUNTER — Emergency Department (HOSPITAL_COMMUNITY): Payer: Medicare Other

## 2021-05-27 DIAGNOSIS — L97329 Non-pressure chronic ulcer of left ankle with unspecified severity: Secondary | ICD-10-CM | POA: Diagnosis present

## 2021-05-27 DIAGNOSIS — R6520 Severe sepsis without septic shock: Secondary | ICD-10-CM

## 2021-05-27 DIAGNOSIS — I480 Paroxysmal atrial fibrillation: Secondary | ICD-10-CM | POA: Diagnosis not present

## 2021-05-27 DIAGNOSIS — E46 Unspecified protein-calorie malnutrition: Secondary | ICD-10-CM | POA: Diagnosis not present

## 2021-05-27 DIAGNOSIS — Z66 Do not resuscitate: Secondary | ICD-10-CM | POA: Diagnosis not present

## 2021-05-27 DIAGNOSIS — L97902 Non-pressure chronic ulcer of unspecified part of unspecified lower leg with fat layer exposed: Secondary | ICD-10-CM

## 2021-05-27 DIAGNOSIS — I83013 Varicose veins of right lower extremity with ulcer of ankle: Secondary | ICD-10-CM | POA: Diagnosis present

## 2021-05-27 DIAGNOSIS — I4891 Unspecified atrial fibrillation: Secondary | ICD-10-CM

## 2021-05-27 DIAGNOSIS — I493 Ventricular premature depolarization: Secondary | ICD-10-CM | POA: Diagnosis present

## 2021-05-27 DIAGNOSIS — J449 Chronic obstructive pulmonary disease, unspecified: Secondary | ICD-10-CM

## 2021-05-27 DIAGNOSIS — Z833 Family history of diabetes mellitus: Secondary | ICD-10-CM

## 2021-05-27 DIAGNOSIS — J441 Chronic obstructive pulmonary disease with (acute) exacerbation: Secondary | ICD-10-CM | POA: Diagnosis present

## 2021-05-27 DIAGNOSIS — I959 Hypotension, unspecified: Secondary | ICD-10-CM | POA: Diagnosis not present

## 2021-05-27 DIAGNOSIS — F411 Generalized anxiety disorder: Secondary | ICD-10-CM | POA: Diagnosis present

## 2021-05-27 DIAGNOSIS — L89152 Pressure ulcer of sacral region, stage 2: Secondary | ICD-10-CM | POA: Diagnosis present

## 2021-05-27 DIAGNOSIS — E43 Unspecified severe protein-calorie malnutrition: Secondary | ICD-10-CM | POA: Diagnosis present

## 2021-05-27 DIAGNOSIS — Z7952 Long term (current) use of systemic steroids: Secondary | ICD-10-CM | POA: Diagnosis not present

## 2021-05-27 DIAGNOSIS — I739 Peripheral vascular disease, unspecified: Secondary | ICD-10-CM | POA: Diagnosis present

## 2021-05-27 DIAGNOSIS — J189 Pneumonia, unspecified organism: Secondary | ICD-10-CM | POA: Diagnosis not present

## 2021-05-27 DIAGNOSIS — U071 COVID-19: Secondary | ICD-10-CM | POA: Diagnosis not present

## 2021-05-27 DIAGNOSIS — R64 Cachexia: Secondary | ICD-10-CM | POA: Diagnosis present

## 2021-05-27 DIAGNOSIS — E861 Hypovolemia: Secondary | ICD-10-CM | POA: Diagnosis present

## 2021-05-27 DIAGNOSIS — I482 Chronic atrial fibrillation, unspecified: Secondary | ICD-10-CM | POA: Diagnosis present

## 2021-05-27 DIAGNOSIS — J690 Pneumonitis due to inhalation of food and vomit: Secondary | ICD-10-CM | POA: Diagnosis not present

## 2021-05-27 DIAGNOSIS — Z681 Body mass index (BMI) 19 or less, adult: Secondary | ICD-10-CM

## 2021-05-27 DIAGNOSIS — I5042 Chronic combined systolic (congestive) and diastolic (congestive) heart failure: Secondary | ICD-10-CM | POA: Diagnosis present

## 2021-05-27 DIAGNOSIS — J9600 Acute respiratory failure, unspecified whether with hypoxia or hypercapnia: Secondary | ICD-10-CM | POA: Diagnosis not present

## 2021-05-27 DIAGNOSIS — I878 Other specified disorders of veins: Secondary | ICD-10-CM | POA: Diagnosis present

## 2021-05-27 DIAGNOSIS — L8990 Pressure ulcer of unspecified site, unspecified stage: Secondary | ICD-10-CM | POA: Insufficient documentation

## 2021-05-27 DIAGNOSIS — E871 Hypo-osmolality and hyponatremia: Secondary | ICD-10-CM | POA: Diagnosis present

## 2021-05-27 DIAGNOSIS — I83023 Varicose veins of left lower extremity with ulcer of ankle: Secondary | ICD-10-CM | POA: Diagnosis present

## 2021-05-27 DIAGNOSIS — A419 Sepsis, unspecified organism: Secondary | ICD-10-CM | POA: Diagnosis not present

## 2021-05-27 DIAGNOSIS — R6521 Severe sepsis with septic shock: Secondary | ICD-10-CM | POA: Diagnosis present

## 2021-05-27 DIAGNOSIS — Z79899 Other long term (current) drug therapy: Secondary | ICD-10-CM

## 2021-05-27 DIAGNOSIS — Z515 Encounter for palliative care: Secondary | ICD-10-CM | POA: Diagnosis not present

## 2021-05-27 DIAGNOSIS — J9611 Chronic respiratory failure with hypoxia: Secondary | ICD-10-CM | POA: Diagnosis present

## 2021-05-27 DIAGNOSIS — L97319 Non-pressure chronic ulcer of right ankle with unspecified severity: Secondary | ICD-10-CM | POA: Diagnosis present

## 2021-05-27 DIAGNOSIS — Z9981 Dependence on supplemental oxygen: Secondary | ICD-10-CM | POA: Diagnosis not present

## 2021-05-27 DIAGNOSIS — I872 Venous insufficiency (chronic) (peripheral): Secondary | ICD-10-CM | POA: Diagnosis present

## 2021-05-27 DIAGNOSIS — Z7901 Long term (current) use of anticoagulants: Secondary | ICD-10-CM

## 2021-05-27 DIAGNOSIS — R54 Age-related physical debility: Secondary | ICD-10-CM | POA: Diagnosis present

## 2021-05-27 DIAGNOSIS — J698 Pneumonitis due to inhalation of other solids and liquids: Secondary | ICD-10-CM | POA: Diagnosis not present

## 2021-05-27 DIAGNOSIS — D649 Anemia, unspecified: Secondary | ICD-10-CM | POA: Diagnosis not present

## 2021-05-27 LAB — CBC WITH DIFFERENTIAL/PLATELET
Abs Immature Granulocytes: 0.4 10*3/uL — ABNORMAL HIGH (ref 0.00–0.07)
Basophils Absolute: 0.1 10*3/uL (ref 0.0–0.1)
Basophils Relative: 0 %
Eosinophils Absolute: 0 10*3/uL (ref 0.0–0.5)
Eosinophils Relative: 0 %
HCT: 34.8 % — ABNORMAL LOW (ref 36.0–46.0)
Hemoglobin: 10.7 g/dL — ABNORMAL LOW (ref 12.0–15.0)
Immature Granulocytes: 2 %
Lymphocytes Relative: 4 %
Lymphs Abs: 1 10*3/uL (ref 0.7–4.0)
MCH: 29.3 pg (ref 26.0–34.0)
MCHC: 30.7 g/dL (ref 30.0–36.0)
MCV: 95.3 fL (ref 80.0–100.0)
Monocytes Absolute: 1.4 10*3/uL — ABNORMAL HIGH (ref 0.1–1.0)
Monocytes Relative: 6 %
Neutro Abs: 21.2 10*3/uL — ABNORMAL HIGH (ref 1.7–7.7)
Neutrophils Relative %: 88 %
Platelets: 544 10*3/uL — ABNORMAL HIGH (ref 150–400)
RBC: 3.65 MIL/uL — ABNORMAL LOW (ref 3.87–5.11)
RDW: 13.1 % (ref 11.5–15.5)
WBC: 24.2 10*3/uL — ABNORMAL HIGH (ref 4.0–10.5)
nRBC: 0 % (ref 0.0–0.2)

## 2021-05-27 LAB — PROCALCITONIN: Procalcitonin: 0.75 ng/mL

## 2021-05-27 LAB — TROPONIN I (HIGH SENSITIVITY)
Troponin I (High Sensitivity): 18 ng/L — ABNORMAL HIGH (ref <18)
Troponin I (High Sensitivity): 51 ng/L — ABNORMAL HIGH (ref <18)

## 2021-05-27 LAB — COMPREHENSIVE METABOLIC PANEL
ALT: 8 U/L (ref 0–44)
AST: 17 U/L (ref 15–41)
Albumin: 1.9 g/dL — ABNORMAL LOW (ref 3.5–5.0)
Alkaline Phosphatase: 124 U/L (ref 38–126)
Anion gap: 12 (ref 5–15)
BUN: 12 mg/dL (ref 8–23)
CO2: 27 mmol/L (ref 22–32)
Calcium: 8.3 mg/dL — ABNORMAL LOW (ref 8.9–10.3)
Chloride: 89 mmol/L — ABNORMAL LOW (ref 98–111)
Creatinine, Ser: 0.81 mg/dL (ref 0.44–1.00)
GFR, Estimated: >60 mL/min (ref >60)
Glucose, Bld: 130 mg/dL — ABNORMAL HIGH (ref 70–99)
Potassium: 4.4 mmol/L (ref 3.5–5.1)
Sodium: 128 mmol/L — ABNORMAL LOW (ref 135–145)
Total Bilirubin: 1.1 mg/dL (ref 0.3–1.2)
Total Protein: 5.4 g/dL — ABNORMAL LOW (ref 6.5–8.1)

## 2021-05-27 LAB — RESP PANEL BY RT-PCR (FLU A&B, COVID) ARPGX2
Influenza A by PCR: NEGATIVE
Influenza B by PCR: NEGATIVE
SARS Coronavirus 2 by RT PCR: NEGATIVE

## 2021-05-27 LAB — MAGNESIUM: Magnesium: 1.4 mg/dL — ABNORMAL LOW (ref 1.7–2.4)

## 2021-05-27 LAB — PROTIME-INR
INR: 1.5 — ABNORMAL HIGH (ref 0.8–1.2)
Prothrombin Time: 18.2 seconds — ABNORMAL HIGH (ref 11.4–15.2)

## 2021-05-27 LAB — LACTIC ACID, PLASMA
Lactic Acid, Venous: 2.6 mmol/L (ref 0.5–1.9)
Lactic Acid, Venous: 1.7 mmol/L (ref 0.5–1.9)

## 2021-05-27 LAB — MRSA NEXT GEN BY PCR, NASAL: MRSA by PCR Next Gen: NOT DETECTED

## 2021-05-27 LAB — BRAIN NATRIURETIC PEPTIDE: B Natriuretic Peptide: 526.6 pg/mL — ABNORMAL HIGH (ref 0.0–100.0)

## 2021-05-27 MED ORDER — HEPARIN (PORCINE) 25000 UT/250ML-% IV SOLN
700.0000 [IU]/h | INTRAVENOUS | Status: DC
  Administered 2021-05-27: 550 [IU]/h via INTRAVENOUS
  Filled 2021-05-27: qty 250

## 2021-05-27 MED ORDER — SODIUM CHLORIDE 0.9 % IV BOLUS
500.0000 mL | Freq: Once | INTRAVENOUS | Status: AC
  Administered 2021-05-27: 500 mL via INTRAVENOUS

## 2021-05-27 MED ORDER — CHLORHEXIDINE GLUCONATE CLOTH 2 % EX PADS
6.0000 | MEDICATED_PAD | Freq: Every day | CUTANEOUS | Status: DC
  Administered 2021-05-27 – 2021-05-28 (×3): 6 via TOPICAL

## 2021-05-27 MED ORDER — MAGNESIUM SULFATE 2 GM/50ML IV SOLN: 2.0000 g | Freq: Once | INTRAVENOUS | Status: DC

## 2021-05-27 MED ORDER — ARFORMOTEROL TARTRATE 15 MCG/2ML IN NEBU
15.0000 ug | INHALATION_SOLUTION | Freq: Two times a day (BID) | RESPIRATORY_TRACT | Status: DC
  Administered 2021-05-27 – 2021-06-05 (×19): 15 ug via RESPIRATORY_TRACT
  Filled 2021-05-27 (×23): qty 2

## 2021-05-27 MED ORDER — POLYETHYLENE GLYCOL 3350 17 G PO PACK: 17.0000 g | PACK | Freq: Every day | ORAL | Status: DC | PRN

## 2021-05-27 MED ORDER — BUDESONIDE 0.25 MG/2ML IN SUSP
0.2500 mg | Freq: Two times a day (BID) | RESPIRATORY_TRACT | Status: DC
  Filled 2021-05-27: qty 2

## 2021-05-27 MED ORDER — AMIODARONE HCL IN DEXTROSE 360-4.14 MG/200ML-% IV SOLN
60.0000 mg/h | INTRAVENOUS | Status: DC
  Administered 2021-05-27 (×2): 60 mg/h via INTRAVENOUS
  Filled 2021-05-27: qty 200

## 2021-05-27 MED ORDER — MAGNESIUM SULFATE 2 GM/50ML IV SOLN
2.0000 g | Freq: Once | INTRAVENOUS | Status: DC
  Administered 2021-05-27: 2 g via INTRAVENOUS
  Filled 2021-05-27: qty 50

## 2021-05-27 MED ORDER — BUDESONIDE 0.5 MG/2ML IN SUSP
0.5000 mg | Freq: Two times a day (BID) | RESPIRATORY_TRACT | Status: DC
  Administered 2021-05-27 – 2021-06-05 (×19): 0.5 mg via RESPIRATORY_TRACT
  Filled 2021-05-27 (×22): qty 2

## 2021-05-27 MED ORDER — AMIODARONE LOAD VIA INFUSION
150.0000 mg | Freq: Once | INTRAVENOUS | Status: AC
  Administered 2021-05-27: 150 mg via INTRAVENOUS
  Filled 2021-05-27: qty 83.34

## 2021-05-27 MED ORDER — DOCUSATE SODIUM 100 MG PO CAPS: 100.0000 mg | ORAL_CAPSULE | Freq: Two times a day (BID) | ORAL | Status: DC | PRN

## 2021-05-27 MED ORDER — SODIUM CHLORIDE 0.9 % IV SOLN
1.0000 g | Freq: Once | INTRAVENOUS | Status: AC
  Administered 2021-05-27: 1 g via INTRAVENOUS
  Filled 2021-05-27: qty 10

## 2021-05-27 MED ORDER — LACTATED RINGERS IV BOLUS: 1000.0000 mL | Freq: Once | INTRAVENOUS | Status: DC

## 2021-05-27 MED ORDER — REVEFENACIN 175 MCG/3ML IN SOLN
175.0000 ug | Freq: Every day | RESPIRATORY_TRACT | Status: DC
  Administered 2021-05-28 – 2021-06-05 (×8): 175 ug via RESPIRATORY_TRACT
  Filled 2021-05-27 (×12): qty 3

## 2021-05-27 MED ORDER — SODIUM CHLORIDE 0.9 % IV SOLN
3.0000 g | Freq: Three times a day (TID) | INTRAVENOUS | Status: AC
  Administered 2021-05-27 – 2021-05-31 (×13): 3 g via INTRAVENOUS
  Filled 2021-05-27 (×6): qty 8
  Filled 2021-05-27: qty 3
  Filled 2021-05-27 (×7): qty 8
  Filled 2021-05-27 (×3): qty 3
  Filled 2021-05-27: qty 8
  Filled 2021-05-27: qty 3
  Filled 2021-05-27 (×3): qty 8

## 2021-05-27 MED ORDER — NOREPINEPHRINE 4 MG/250ML-% IV SOLN
0.0000 ug/min | INTRAVENOUS | Status: DC
  Administered 2021-05-27: 2 ug/min via INTRAVENOUS

## 2021-05-27 MED ORDER — MAGNESIUM SULFATE 4 GM/100ML IV SOLN: 4.0000 g | Freq: Once | INTRAVENOUS | Status: DC

## 2021-05-27 MED ORDER — AMIODARONE HCL IN DEXTROSE 360-4.14 MG/200ML-% IV SOLN
30.0000 mg/h | INTRAVENOUS | Status: DC
  Administered 2021-05-28: 30 mg/h via INTRAVENOUS
  Filled 2021-05-27: qty 200

## 2021-05-27 MED ORDER — IPRATROPIUM-ALBUTEROL 0.5-2.5 (3) MG/3ML IN SOLN
3.0000 mL | RESPIRATORY_TRACT | Status: DC | PRN
  Administered 2021-05-31: 3 mL via RESPIRATORY_TRACT
  Filled 2021-05-27: qty 3

## 2021-05-27 MED ORDER — SODIUM CHLORIDE 0.9 % IV SOLN
INTRAVENOUS | Status: DC | PRN
  Administered 2021-05-27: 500 mL via INTRAVENOUS
  Administered 2021-05-29: 250 mL via INTRAVENOUS

## 2021-05-27 MED ORDER — ETOMIDATE 2 MG/ML IV SOLN
6.0000 mg | Freq: Once | INTRAVENOUS | Status: AC
  Administered 2021-05-27: 6 mg via INTRAVENOUS
  Filled 2021-05-27: qty 10

## 2021-05-27 MED ORDER — SODIUM CHLORIDE 0.9 % IV SOLN
500.0000 mg | Freq: Once | INTRAVENOUS | Status: DC
  Administered 2021-05-27: 500 mg via INTRAVENOUS
  Filled 2021-05-27: qty 500

## 2021-05-27 MED ORDER — SODIUM CHLORIDE 0.9 % IV BOLUS
1000.0000 mL | Freq: Once | INTRAVENOUS | Status: AC
  Administered 2021-05-27: 1000 mL via INTRAVENOUS

## 2021-05-27 MED ORDER — MAGNESIUM SULFATE 2 GM/50ML IV SOLN
2.0000 g | Freq: Once | INTRAVENOUS | Status: AC
  Administered 2021-05-27: 2 g via INTRAVENOUS
  Filled 2021-05-27: qty 50

## 2021-05-27 NOTE — H&P (Signed)
Critical care attending attestation note:  Patient seen and examined and relevant ancillary tests reviewed.  I agree with the assessment and plan of care as outlined by Mick Sell, PA-C. The following reflects my independent critical care time.   Synopsis of assessment and plan: 75 year old female with chronic A. fib, chronic hypoxic respiratory failure due to COPD on home oxygen was brought into the emergency department with complaint of worsening shortness of breath and chest tightness for 1 week. In the emergency department she was noted to be hypoxic, in A. fib with RVR and heart rate 150s, she had unsuccessful electrical cardioversion performed x3.  PCCM was called to help with management   Physical exam: General: Critically ill-appearing cachectic female, lying on the bed HEENT: [place]/AT, eyes anicteric.  Dry mucous membranes Neuro: Alert, awake, following commands, moving all 4 extremities Chest: Crackles heard at bases right more than left, no wheezes or rhonchi Heart: Irregularly irregular, tachycardic, no murmurs or gallops Abdomen: Soft, nontender, nondistended, bowel sounds present Skin: No rash.  Patient has large wounds around her ankles bilaterally, pus is coming out  Assessment plan: Sepsis due to right lower lobe pneumonia and infected bilateral ankle wounds Chronic atrial fibrillation with rapid ventricular response Dehydration Chronic hypoxic respiratory failure Cachexia Hyponatremia, hypovolemic Chronic diastolic congestive heart failure  Continue IV fluid and broad-spectrum antibiotic to cover anaerobes Follow-up cultures Sent urine for Legionella antigen Send wound culture from ankle Patient's heart rate is not well controlled, she had unsuccessful attempt with electrical cardioversion x2 in the emergency department we will start her on amiodarone after giving bolus 150 mg x 1 Continue oxygen to maintain O2 sat 88-92% Dietitian consult Closely monitor serum  sodium  CRITICAL CARE Performed by: Jacky Kindle   Total independent critical care time: 39 minutes  Critical care time was exclusive of separately billable procedures and treating other patients.  Critical care was necessary to treat or prevent imminent or life-threatening deterioration.   Critical care was time spent personally by me on the following activities: development of treatment plan with patient and/or surrogate as well as nursing, discussions with consultants, evaluation of patient's response to treatment, examination of patient, obtaining history from patient or surrogate, ordering and performing treatments and interventions, ordering and review of laboratory studies, ordering and review of radiographic studies, pulse oximetry, re-evaluation of patient's condition and participation in multidisciplinary rounds.  Jacky Kindle MD [facility] See Amion for pager If no response to pager, please call [phone] until 7pm After 7pm, Please call E-link [phone]  05/27/2021, 3:51 PM

## 2021-05-27 NOTE — ED Notes (Signed)
Pt's BP decreased to 60 systolic, MD Long aware and at bedside.

## 2021-05-27 NOTE — ED Notes (Signed)
Cardiology at bedside

## 2021-05-27 NOTE — Progress Notes (Signed)
Pharmacy Antibiotic Note  Kristen [name] is a 75 y.o. female admitted on 05/27/2021 presented with SOB in afib w/RVR in ED, concern for aspiration as well as lower leg wound.  Pharmacy has been consulted for Unasyn dosing.  Plan: Unasyn 3g IV every 8 hours Monitor renal function, clinical progression and LOT  Height: _0  (177.8 cm) Weight: 40.8 kg (90 lb) IBW/kg (Calculated) : 68.5  Temp (24hrs), Avg:98.2 F (36.8 C), Min:98.2 F (36.8 C), Max:98.2 F (36.8 C)  Recent Labs  Lab 05/27/21 1220 05/27/21 1224  WBC 24.2*  --   CREATININE 0.81  --   LATICACIDVEN  --  2.6*    Estimated Creatinine Clearance: 38.1 mL/min (by C-G formula based on SCr of 0.81 mg/dL).    Allergies  Allergen Reactions   Gabapentin Other (See Comments)    Hallucination/ nightmares    Bertis Ruddy, PharmD Clinical Pharmacist ED Pharmacist Phone # [phone] 05/27/2021 3:18 PM

## 2021-05-27 NOTE — ED Notes (Addendum)
150J shock delivered, no change in rhythm.

## 2021-05-27 NOTE — ED Notes (Signed)
100J shock delivered, no change in rhythm.

## 2021-05-27 NOTE — ED Provider Notes (Signed)
[facility] EMERGENCY DEPARTMENT Provider Note   CSN: [id number] Arrival date & time: 05/27/21  1212     History Chief Complaint  Patient presents with   Shortness of Breath   afib rvr    Kristen [name] is a 75 y.o. female.  Patient with history of COPD on 3 L O2 chronically, cachexia, paroxysmal A. fib on Xarelto --presents to the emergency department today for evaluation of "feeling poorly.  Patient states that she has had achiness in her chest, increased shortness of breath over the past approximately a week.  She states compliance with her medications but has not taken her blood thinner today.  EMS was called.  She was found to have oxygen saturations in the 80s on 3 L.  She presented on nonrebreather and is now on 5 L with normal saturation.  No lower extremity swelling.  She denies fever, URI symptoms.  She thinks that cough and sputum production slightly increased from baseline.  EMS gave 10 mg of Cardizem with minimal improvement temporarily.  Blood pressure 22-29 systolic on arrival.  Typically blood pressure closer to normal.  Follows with internal medicine teaching service.      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 [facility])     Patient Active Problem List   Diagnosis Date Noted   Cellulitis of left lower extremity 05/06/2021   Aortic atherosclerosis ([place]) 03/07/2021   Opiate use 03/05/2021   Need for assessment by dentistry for poor dentition 03/05/2021   Abdominal pain 02/06/2021   Health care maintenance 12/27/2020   Paroxysmal atrial fibrillation with rapid ventricular response ([place]) 12/16/2020   Nausea 11/07/2020   Lower extremity ulceration ([place]) 10/04/2020   Generalized anxiety disorder 08/27/2020   Pulmonary  cachexia due to COPD ([place])    Smoking 07/18/2020   Venous insufficiency of both lower extremities 07/18/2020   COPD (chronic obstructive pulmonary disease) ([place]) 06/28/2020   Protein-calorie malnutrition ([place]) 06/28/2020   Hyponatremia 06/28/2020    Past Surgical History:  Procedure Laterality Date   VASCULAR SURGERY       OB History   No obstetric history on file.     Family History  Problem Relation Age of Onset   Diabetes Mother    Diabetes Mellitus II Mother    Diabetes Father    Diverticulitis Sister     Social History   Tobacco Use   Smoking status: Former    Packs/day: 0.25    Pack years: 0.00    Types: Cigarettes    Quit date: 10/16/2019    Years since quitting: 1.6   Smokeless tobacco: Never   Tobacco comments:    2-3 per day   Vaping Use   Vaping Use: Never used  Substance Use Topics   Alcohol use: Yes    Comment: occasionally   Drug use: Never    Home Medications Prior to Admission medications   Medication Sig Start Date End Date Taking? Authorizing Provider  albuterol (VENTOLIN HFA) 108 (90 Base) MCG/ACT inhaler INHALE 1-2 PUFFS BY MOUTH EVERY 6 HOURS AS NEEDED FOR WHEEZE OR SHORTNESS OF BREATH Patient taking differently: Inhale 2 puffs into the lungs every 6 (six) hours as needed for wheezing or shortness of breath.  01/13/21   Jean Rosenthal, MD  busPIRone (BUSPAR) 10 MG tablet Take 10 mg by mouth daily. 03/02/21   [provider]  fluticasone (FLONASE) 50 MCG/ACT nasal spray Place 1 spray into both nostrils daily as needed for allergies or rhinitis. 03/31/21   Katsadouros, Vasilios, MD  INCRUSE ELLIPTA 62.5 MCG/INH AEPB INHALE 1 PUFF BY MOUTH EVERY DAY 04/08/21   Gaylan Gerold, DO  ipratropium-albuterol (DUONEB) 0.5-2.5 (3) MG/3ML SOLN TAKE 3 MLS BY NEBULIZATION EVERY 6 (SIX) HOURS AS NEEDED (SHORTNESS OF BREATH/WHEEZING.). 04/08/21 07/07/21  Gaylan Gerold, DO  metoprolol succinate (TOPROL-XL) 25 MG 24 hr tablet Take 2 tablets (50 mg total) by mouth  daily. 03/04/21   Harvie Heck, MD  mirtazapine (REMERON) 15 MG tablet Take 1 tablet (15 mg total) by mouth at bedtime as needed (sleep). 03/18/21   Mosetta Anis, MD  mometasone-formoterol (DULERA) 200-5 MCG/ACT AERO Inhale 1 puff into the lungs in the morning and at bedtime. 05/05/21   [name], MD  Multiple Vitamin (MULTIVITAMIN WITH MINERALS) TABS tablet Take 1 tablet by mouth daily.    [provider]  ondansetron (ZOFRAN) 4 MG tablet Take 1 tablet (4 mg total) by mouth daily as needed for nausea or vomiting. 05/05/21   [name], MD  oxyCODONE-acetaminophen (PERCOCET) 10-325 MG tablet Take 1 tablet by mouth every 8 (eight) hours as needed for pain. 05/05/21 06/04/21  [name], MD  OXYGEN Inhale 3 L/min into the lungs.    [provider]  predniSONE (DELTASONE) 20 MG tablet Take 2 tablets (40 mg total) by mouth daily with breakfast. 03/10/21   Virl Axe, MD  XARELTO 15 MG TABS tablet TAKE 1 TABLET (15 MG TOTAL) BY MOUTH DAILY. 04/08/21   Gaylan Gerold, DO    Allergies    Gabapentin  Review of Systems   Review of Systems  Constitutional:  Negative for chills and fever.  HENT:  Negative for rhinorrhea and sore throat.   Eyes:  Negative for redness.  Respiratory:  Positive for cough and shortness of breath.   Cardiovascular:  Positive for chest pain. Negative for leg swelling.  Gastrointestinal:  Negative for abdominal pain, diarrhea, nausea and vomiting.  Genitourinary:  Negative for dysuria, frequency, hematuria and urgency.  Musculoskeletal:  Negative for myalgias.  Skin:  Positive for wound (chronic, bilateral feet). Negative for rash.  Neurological:  Negative for headaches.   Physical Exam Updated Vital Signs BP (!) 82/67 (BP Location: Right Arm)   Pulse (!) 156   Temp 98.2 F (36.8 C) (Oral)   Resp (!) 22   Ht 5' 10" (1.778 m)   Wt 40.8 kg   SpO2 93% Comment: 5L  BMI 12.91 kg/m   Physical Exam Vitals and nursing note reviewed.   Constitutional:      General: She is not in acute distress.    Appearance: She is well-developed. She is cachectic.  HENT:     Head: Normocephalic and atraumatic.     Right Ear: External ear normal.     Left Ear: External ear normal.     Nose: Nose normal.  Eyes:     Conjunctiva/sclera: Conjunctivae normal.  Cardiovascular:     Rate and Rhythm: Tachycardia present. Rhythm irregular.     Heart sounds: No murmur heard. Pulmonary:     Effort: Tachypnea (mild) present. No respiratory distress.     Breath sounds: Rhonchi present. No wheezing or rales.     Comments: Frequent, wet sounding cough Abdominal:  Palpations: Abdomen is soft.     Tenderness: There is no abdominal tenderness. There is no guarding or rebound.  Musculoskeletal:     Cervical back: Normal range of motion and neck supple.     Right lower leg: No tenderness. No edema.     Left lower leg: No tenderness. No edema.     Comments: Chronic appearing foot/ankle ulceration.   Skin:    General: Skin is warm and dry.     Findings: No rash.  Neurological:     General: No focal deficit present.     Mental Status: She is alert. Mental status is at baseline.     Motor: No weakness.  Psychiatric:        Mood and Affect: Mood normal.    ED Results / Procedures / Treatments   Labs (all labs ordered are listed, but only abnormal results are displayed) Labs Reviewed  COMPREHENSIVE METABOLIC PANEL - Abnormal; Notable for the following components:      Result Value   Sodium 128 (*)    Chloride 89 (*)    Glucose, Bld 130 (*)    Calcium 8.3 (*)    Total Protein 5.4 (*)    Albumin 1.9 (*)    All other components within normal limits  LACTIC ACID, PLASMA - Abnormal; Notable for the following components:   Lactic Acid, Venous 2.6 (*)    All other components within normal limits  CBC WITH DIFFERENTIAL/PLATELET - Abnormal; Notable for the following components:   WBC 24.2 (*)    RBC 3.65 (*)    Hemoglobin 10.7 (*)    HCT  34.8 (*)    Platelets 544 (*)    Neutro Abs 21.2 (*)    Monocytes Absolute 1.4 (*)    Abs Immature Granulocytes 0.40 (*)    All other components within normal limits  PROTIME-INR - Abnormal; Notable for the following components:   Prothrombin Time 18.2 (*)    INR 1.5 (*)    All other components within normal limits  TROPONIN I (HIGH SENSITIVITY) - Abnormal; Notable for the following components:   Troponin I (High Sensitivity) 18 (*)    All other components within normal limits  RESP PANEL BY RT-PCR (FLU A&B, COVID) ARPGX2  CULTURE, BLOOD (ROUTINE X 2)  CULTURE, BLOOD (ROUTINE X 2)  LACTIC ACID, PLASMA  URINALYSIS, ROUTINE W REFLEX MICROSCOPIC  BRAIN NATRIURETIC PEPTIDE  TROPONIN I (HIGH SENSITIVITY)    ED ECG REPORT   Date: 05/27/2021  Rate: 161  Rhythm: ventricular fibrillation with RVR, PVCs  QRS Axis: normal  Intervals: normal  ST/T Wave abnormalities: nonspecific ST/T changes  Conduction Disutrbances:none  Narrative Interpretation:   Old EKG Reviewed: changes noted  I have personally reviewed the EKG tracing and agree with the computerized printout as noted.   Radiology DG Chest Portable 1 View  Result Date: 05/27/2021 CLINICAL DATA:  Shortness of breath. History of AFib. History of COPD. EXAM: PORTABLE CHEST 1 VIEW COMPARISON:  Chest x-ray 03/07/2021. FINDINGS: Cardiomegaly. Bilateral interstitial prominence, right side greater than left. Findings suggest interstitial edema. Pneumonitis cannot be excluded. Small right pleural effusion. COPD. No pneumothorax. IMPRESSION: 1. Cardiomegaly. Bilateral interstitial prominence, right side greater than left. Findings suggest interstitial edema. Pneumonitis can not be excluded. Small right pleural effusion. 2.  COPD. Electronically Signed   By: Marcello Moores  Register   On: 05/27/2021 12:59    Procedures Procedures   Medications Ordered in ED Medications  azithromycin (ZITHROMAX) 500 mg in sodium chloride  0.9 % 250 mL IVPB (500 mg  Intravenous New Bag/Given 05/27/21 1418)  sodium chloride 0.9 % bolus 500 mL (0 mLs Intravenous Stopped 05/27/21 1412)  etomidate (AMIDATE) injection 6 mg (6 mg Intravenous Given 05/27/21 1335)  cefTRIAXone (ROCEPHIN) 1 g in sodium chloride 0.9 % 100 mL IVPB (1 g Intravenous New Bag/Given 05/27/21 1416)  sodium chloride 0.9 % bolus 1,000 mL (1,000 mLs Intravenous New Bag/Given 05/27/21 1432)    ED Course  I have reviewed the triage vital signs and the nursing notes.  Pertinent labs & imaging results that were available during my care of the patient were reviewed by me and considered in my medical decision making (see chart for details).  Patient seen and examined. Work-up initiated. Discussed with and seen by Dr. Laverta Baltimore.   Vital signs reviewed and are as follows: BP (!) 82/67 (BP Location: Right Arm)   Pulse (!) 156   Temp 98.2 F (36.8 C) (Oral)   Resp (!) 22   Ht 5' 10" (1.778 m)   Wt 40.8 kg   SpO2 93% Comment: 5L  BMI 12.91 kg/m   Initial labs and chest x-ray ordered.  Patient is hypotensive but mentating well.  After review of initial labs, and persistent hypotensive despite small fluid bolus, plan to cardiovert.  Confirmed the patient has been compliant with her anticoagulation.   Please see cardioversion note from Dr. Laverta Baltimore.  Attempted x3, patient in normal sinus rhythm briefly, but then returned to rapid A. fib.  Discussed with cardiology who will see patient.  Due to elevated white blood cell count, lactate in twos, questionable chest findings --will cover for community-acquired pneumonia.  Discussed with PCCM and requested consult.  BP (!) 76/57   Pulse (!) 126   Temp 98.2 F (36.8 C) (Oral)   Resp (!) 21   Ht 5' 10" (1.778 m)   Wt 40.8 kg   SpO2 98%   BMI 12.91 kg/m   CRITICAL CARE Performed by: Carlisle Cater PA-C Total critical care time: 45 minutes Critical care time was exclusive of separately billable procedures and treating other patients. Critical care was  necessary to treat or prevent imminent or life-threatening deterioration. Critical care was time spent personally by me on the following activities: development of treatment plan with patient and/or surrogate as well as nursing, discussions with consultants, evaluation of patient's response to treatment, examination of patient, obtaining history from patient or surrogate, ordering and performing treatments and interventions, ordering and review of laboratory studies, ordering and review of radiographic studies, pulse oximetry and re-evaluation of patient's condition.      MDM Rules/Calculators/A&P                          Admit.   Final Clinical Impression(s) / ED Diagnoses Final diagnoses:  Atrial fibrillation with RVR (HCC)  Hypotension, unspecified hypotension type    Rx / DC Orders ED Discharge Orders     None        Carlisle Cater, PA-C 05/27/21 1513    Long, Wonda [facility], MD 06/02/21 [phone]

## 2021-05-27 NOTE — ED Triage Notes (Signed)
Pt arrives via [facility] from home for increased shob. Pt hx COPD, on 3L at baseline. Pt reports increased shob w/ R side chest tightness that started this morning. Afib RVR rate 160-180, EMS gave 68m cardizem IV at 1130, brought rate down to 130 temporarily. Hx on afib on xarelto, CHF  104/60 160 HR 90% 3L [place]

## 2021-05-27 NOTE — H&P (Signed)
NAME:  Kristen [name], MRN:  [id number], DOB:  [birth year], LOS: 0 ADMISSION DATE:  05/27/2021, CONSULTATION DATE:  7/5 REFERRING MD:  Dr. Laverta Baltimore, CHIEF COMPLAINT:  Afib RVR   History of Present Illness:  Patient is a 75 yo F w/ pertinent PMH of Afib (on xarelto and metoprolol), systolic CHF (EF 28-31%) and COPD presenting to [facility] on 7/5 w/ Afib w/ RVR.  Patient has had admitted several times in the past year for COPD exacerbations. Recently admitted 03/07/2021. Previous Echo 10/2020: EF 55-60%; Grade 1 diastolic; D shaped LV consistent w/ RV volume overload.  Patient states she has had symptoms of body aches and not feeling well for about a week. She present to [facility] on 7/5 via EMS for worsening chest achiness and some SOB. States she has been compliant w/ medications. Did not take Xarelto today. EKG shows Afib RVR. BP 72/58. O2 sats in 80s on 3 l/m [place]. Increased sputum production. No swelling, fever. Negative covid/flu. BCX2 pending. CXR shows RLL infiltrate vs. Pulmonary edema. WBC 24, Na 128, Hgb 10.7, Lactic acid 2.6.   EMS gave 10 mg of Cardizem en route with temporary improvement in HR. BP 80-90s on arrival to [facility] ED. Attempted cardioversion x3 with NSR briefly, but back to Afib RVR. Ongoing hypotension post fluid bolus. Cardiology consulted.   PCCM consulted for admission and medical management.  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 Fluid bolus given. Started Amio drip. Unasyn started for aspiration ppx.   Interim History / Subjective:  Patient has some fatigue and mild chest pain when coughing; states it feels like pleurisy. Patient able to speak in complete sentences. HR 130-140s irregular; SBP 80s and MAP 60s. On 3 liters [place] (wears 3 liters at home). Sats 99%  Objective   Blood pressure (!) 115/98, pulse (!) 132, temperature 98.2 F (36.8 C), temperature source Oral, resp. rate 15, height _0  (1.778 m), weight 40.8 kg, SpO2 98 %.        Intake/Output Summary (Last 24 hours) at 05/27/2021 1427 Last data filed at 05/27/2021 1412 Gross per 24 hour  Intake 900 ml  Output --  Net 900 ml   Filed Weights   05/27/21 1222  Weight: 40.8 kg    Examination: General:  cachectic; NAD; on home 3 liters o2 HEENT: MM pink/moist; [place] in place Neuro: AOx3 CV: s1s2, EKG shows Afib RVR; rate in 130-140s irregular, no m/r/g PULM:  diminished BS bilaterally; CXR RLL infiltrate vs edema; on 3 liters home O2 w/ sats 99% GI: soft, bsx4 active  Extremities: w/ no edema; BLE medial ulcers present-without drainage Left ankle  Right ankle   Resolved Hospital Problem list     Assessment & Plan:   Acute on chronic Afib RVR Cardiogenic shock vs. Septic shock Chronic HFrEF PAD: Echo 10/2020-EF 55-60%; Grade 1 diastolic; D shaped LV consistent w/ RV volume overload. P: -  Cardiology consulted -Will start amio bolus and drip -admit to ICU for telemetry monitoring -Start systemic heparin per pharmacy; no bolus -Fluid bolus given -Consider Levo if remains hypotensive w/ MAP goal >65 -hold home metoprolol -Trend electrolytes, lactic acid, procalcitonin, and BCx2  COPD PAD: Wears 3 liters at home Possible aspiration pneumonia Tobacco abuse P: -ordered yupelri, brovana, pulmicort -prn duoneb -smoking cessation education given -continue home 3 liters [place] for sats >88% -start Unasyn for aspiration ppx -incentive and  flutter valve ordered  Chronic BLE medial malleolus ulceration PAD Chronic venous insufficiency P: -wound care consult -will start unasyn for anaerobic coverage  Chronic Hyponatremia: 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  P: -Trend BMP -consider further workup  Protein calorie malnutrition BMI 12.9 P: -supportive care  Best Practice (right click and "Reselect all SmartList Selections" daily)   Diet/type: NPO w/ oral meds DVT prophylaxis: systemic heparin GI prophylaxis: H2B Lines: N/A Foley:  N/A Code Status:  full code Last date of multidisciplinary goals of care discussion [7/5 no family at bedside; spoke with patient; states that she would like to discuss with sister when she gets here to decide Code status]  Labs   CBC: Recent Labs  Lab 05/27/21 1220  WBC 24.2*  NEUTROABS 21.2*  HGB 10.7*  HCT 34.8*  MCV 95.3  PLT 544*    Basic Metabolic Panel: Recent Labs  Lab 05/27/21 1220  NA 128*  K 4.4  CL 89*  CO2 27  GLUCOSE 130*  BUN 12  CREATININE 0.81  CALCIUM 8.3*   GFR: Estimated Creatinine Clearance: 38.1 mL/min (by C-G formula based on SCr of 0.81 mg/dL). Recent Labs  Lab 05/27/21 1220 05/27/21 1224  WBC 24.2*  --   LATICACIDVEN  --  2.6*    Liver Function Tests: Recent Labs  Lab 05/27/21 1220  AST 17  ALT 8  ALKPHOS 124  BILITOT 1.1  PROT 5.4*  ALBUMIN 1.9*   No results for input(s): LIPASE, AMYLASE in the last 168 hours. No results for input(s): AMMONIA in the last 168 hours.  ABG    Component Value Date/Time   PHART 7.338 (L) 02/22/2021 1655   PCO2ART 73.8 (HH) 02/22/2021 1655   PO2ART 141 (H) 02/22/2021 1655   HCO3 39.6 (H) 02/22/2021 1655   TCO2 42 (H) 02/22/2021 1655   ACIDBASEDEF 2.0 06/28/2020 1750   O2SAT 99.0 02/22/2021 1655     Coagulation Profile: Recent Labs  Lab 05/27/21 1220  INR 1.5*    Cardiac Enzymes: No results for input(s): CKTOTAL,  CKMB, CKMBINDEX, TROPONINI in the last 168 hours.  HbA1C: Hgb A1c MFr Bld  Date/Time Value Ref Range Status  06/29/2020 04:27 PM 5.6 4.8 - 5.6 % Final    Comment:    (NOTE) Pre diabetes:          5.7%-6.4%  Diabetes:              >6.4%  Glycemic control for   <7.0% adults with diabetes     CBG: No results for input(s): GLUCAP in the last 168 hours.  Review of Systems:    Gen: positive for body aches; Denies fever, chills, weight change, night sweats HEENT: Denies blurred vision, double vision, hearing loss, tinnitus, sinus congestion, rhinorrhea, sore throat, neck stiffness, dysphagia PULM: Mild shortness of breath, productive cough, sputum production; negative for hemoptysis, wheezing CV: positive for chest pain w/ coughing; Negative for edema, orthopnea, paroxysmal nocturnal dyspnea GI: Denies abdominal pain,  nausea, vomiting, diarrhea, hematochezia, melena, constipation, change in bowel habits GU: Denies dysuria, hematuria, polyuria, oliguria, urethral discharge Endocrine: Denies hot or cold intolerance, polyuria, polyphagia or appetite change Derm: positive for chronic medial ankle ulcer without drainage Heme: Denies easy bruising, bleeding, bleeding gums Neuro: Denies headache, numbness, weakness, slurred speech, loss of memory or consciousness   Past Medical History:  She,  has a past medical history of Cellulitis, Chronic venous stasis dermatitis of both lower extremities, COPD (chronic obstructive pulmonary disease) ([place]), Dyspnea, and 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   Surgical History:   Past Surgical History:  Procedure Laterality Date   VASCULAR SURGERY       Social History:  Reports still a current smoker.   Family History:  Her family history includes Diabetes in her father and mother; Diabetes Mellitus II in her mother; Diverticulitis in her sister.   Allergies Allergies  Allergen Reactions   Gabapentin Other (See Comments)    Hallucination/  nightmares     Home Medications  Prior to Admission medications   Medication Sig Start Date End Date Taking? Authorizing Provider  albuterol (VENTOLIN HFA) 108 (90 Base) MCG/ACT inhaler INHALE 1-2 PUFFS BY MOUTH EVERY 6 HOURS AS NEEDED FOR WHEEZE OR SHORTNESS OF BREATH Patient taking differently: Inhale 2 puffs into the lungs every 6 (six) hours as needed for wheezing or shortness of breath. 01/13/21   Agyei, Caprice Kluver, MD  busPIRone (BUSPAR) 10 MG tablet Take 10 mg by mouth daily. 03/02/21   [provider]  fluticasone (FLONASE) 50 MCG/ACT nasal spray Place 1 spray into both nostrils daily as needed for allergies or rhinitis. 03/31/21   Katsadouros, Vasilios, MD  INCRUSE ELLIPTA 62.5 MCG/INH AEPB INHALE 1 PUFF BY MOUTH EVERY DAY 04/08/21   Gaylan Gerold, DO  ipratropium-albuterol (DUONEB) 0.5-2.5 (3) MG/3ML SOLN TAKE 3 MLS BY NEBULIZATION EVERY 6 (SIX) HOURS AS NEEDED (SHORTNESS OF BREATH/WHEEZING.). 04/08/21 07/07/21  Gaylan Gerold, DO  metoprolol succinate (TOPROL-XL) 25 MG 24 hr tablet Take 2 tablets (50 mg total) by mouth daily. 03/04/21   Harvie Heck, MD  mirtazapine (REMERON) 15 MG tablet Take 1 tablet (15 mg total) by mouth at bedtime as needed (sleep). 03/18/21   Mosetta Anis, MD  mometasone-formoterol (DULERA) 200-5 MCG/ACT AERO Inhale 1 puff into the lungs in the morning and at bedtime. 05/05/21   [name], MD  Multiple Vitamin (MULTIVITAMIN WITH MINERALS) TABS tablet Take 1 tablet by mouth daily.    [provider]  ondansetron (ZOFRAN) 4 MG tablet Take 1 tablet (4 mg total) by mouth daily as needed for nausea or vomiting. 05/05/21   [name], MD  oxyCODONE-acetaminophen (PERCOCET) 10-325 MG tablet Take 1 tablet by mouth every 8 (eight) hours as needed for pain. 05/05/21 06/04/21  [name], MD  OXYGEN Inhale 3 L/min into the lungs.    [provider]  predniSONE (DELTASONE) 20 MG tablet Take 2 tablets (40 mg total) by mouth daily with breakfast.  03/10/21   Virl Axe, MD  XARELTO 15 MG TABS tablet TAKE 1 TABLET (15 MG TOTAL) BY MOUTH DAILY. 04/08/21   Gaylan Gerold, DO     Critical care time: 82    JD Rexene Agent [facility] 05/27/2021, 2:27 PM  Please see [email or web address] for pager details.  From 7A-7P if no response, please call [phone]. After hours, please call ELink [phone].

## 2021-05-27 NOTE — Progress Notes (Signed)
ANTICOAGULATION CONSULT NOTE - Initial Consult  Pharmacy Consult for heparin Indication: atrial fibrillation  Allergies  Allergen Reactions   Gabapentin Other (See Comments)    Hallucination/ nightmares    Patient Measurements: Height: _0  (177.8 cm) Weight: 40.8 kg (90 lb) IBW/kg (Calculated) : 68.5 Heparin Dosing Weight: TBW  Vital Signs: Temp: 98.2 F (36.8 C) (07/05 1221) Temp Source: Oral (07/05 1221) BP: 120/82 (07/05 1555) Pulse Rate: 130 (07/05 1555)  Labs: Recent Labs    05/27/21 1220  HGB 10.7*  HCT 34.8*  PLT 544*  LABPROT 18.2*  INR 1.5*  CREATININE 0.81  TROPONINIHS 18*    Estimated Creatinine Clearance: 38.1 mL/min (by C-G formula based on SCr of 0.81 mg/dL).   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

## 2021-05-27 NOTE — ED Notes (Addendum)
120J shock delivered no change in rhythm

## 2021-05-27 NOTE — ED Notes (Signed)
Consent signed for cardioversion

## 2021-05-27 NOTE — Progress Notes (Signed)
Progress Note  Patient Name: Kristen [name] Date of Encounter: 05/28/2021  [facility] Cardiologist: None   Subjective   Doing much better this morning. Converted back to NSR. Asking for pain medication for her legs and breakfast.   Remains on levo 1; MAPs 70s On 4L [place] Na 128>129 WBC 24.2>21.9  Inpatient Medications    Scheduled Meds:  amiodarone  200 mg Oral BID   arformoterol  15 mcg Nebulization BID   budesonide (PULMICORT) nebulizer solution  0.5 mg Nebulization BID   Chlorhexidine Gluconate Cloth  6 each Topical Daily   famotidine  20 mg Oral Daily   revefenacin  175 mcg Nebulization Daily   Continuous Infusions:  sodium chloride 10 mL/hr at 05/28/21 0600   ampicillin-sulbactam (UNASYN) IV Stopped (05/28/21 0022)   heparin 700 Units/hr (05/28/21 0702)   lactated ringers     norepinephrine (LEVOPHED) Adult infusion 2 mcg/min (05/28/21 0600)   PRN Meds: sodium chloride, acetaminophen, docusate sodium, ipratropium-albuterol, oxyCODONE-acetaminophen, polyethylene glycol   Vital Signs    Vitals:   05/28/21 0615 05/28/21 0630 05/28/21 0645 05/28/21 0700  BP: 113/68 116/65 121/61 113/62  Pulse: 74 71 70 68  Resp: (!) 21 (!) 23 (!) 22 (!) 32  Temp:      TempSrc:      SpO2: 95% 97% 97% 96%  Weight:      Height:        Intake/Output Summary (Last 24 hours) at 05/28/2021 0754 Last data filed at 05/28/2021 0600 Gross per 24 hour  Intake 2118.06 ml  Output 700 ml  Net 1418.06 ml   Last 3 Weights 05/28/2021 05/27/2021 05/27/2021  Weight (lbs) 91 lb 14.9 oz 91 lb 11.4 oz 90 lb  Weight (kg) 41.7 kg 41.6 kg 40.824 kg      Telemetry    Converted to NSR with occasional PVCs - Personally Reviewed  ECG    No new tracing 05/28/21 - Personally Reviewed  Physical Exam   GEN: Cachectic, comfortable, NAD  Neck: No JVD Cardiac: RR, no murmurs Respiratory: Diminished throughout GI: Thin, soft, nontender, non-distended  MS: LE wounds wrapped. No edema Neuro:  Nonfocal   Psych: Normal affect   Labs    High Sensitivity Troponin:   Recent Labs  Lab 05/27/21 1220 05/27/21 1519  TROPONINIHS 18* 51*      Chemistry Recent Labs  Lab 05/27/21 1220 05/28/21 0437  NA 128* 129*  K 4.4 3.8  CL 89* 95*  CO2 27 28  GLUCOSE 130* 104*  BUN 12 13  CREATININE 0.81 0.58  CALCIUM 8.3* 7.9*  PROT 5.4*  --   ALBUMIN 1.9*  --   AST 17  --   ALT 8  --   ALKPHOS 124  --   BILITOT 1.1  --   GFRNONAA >60 >60  ANIONGAP 12 6     Hematology Recent Labs  Lab 05/27/21 1220 05/28/21 0437  WBC 24.2* 21.9*  RBC 3.65* 3.33*  HGB 10.7* 9.7*  HCT 34.8* 31.0*  MCV 95.3 93.1  MCH 29.3 29.1  MCHC 30.7 31.3  RDW 13.1 13.4  PLT 544* 509*    BNP Recent Labs  Lab 05/27/21 1220  BNP 526.6*     DDimer No results for input(s): DDIMER in the last 168 hours.   Radiology    DG CHEST PORT 1 VIEW  Result Date: 05/28/2021 CLINICAL DATA:  Shortness of breath.  COPD.  Atrial fibrillation. EXAM: PORTABLE CHEST 1 VIEW COMPARISON:  05/27/2021. FINDINGS: Cardiomegaly  with bilateral interstitial prominence, right side greater than left. Findings suggest interstitial edema. Pneumonitis can not be excluded. Small bilateral pleural effusions. COPD. No pneumothorax. IMPRESSION: 1. Cardiomegaly. Bilateral interstitial prominence, right side greater than left. Findings suggest interstitial edema. Pneumonitis cannot be excluded. Similar findings noted on prior exam. Small bilateral pleural effusions. 2.  COPD. Electronically Signed   By: Marcello Moores  Register   On: 05/28/2021 07:49   DG Chest Portable 1 View  Result Date: 05/27/2021 CLINICAL DATA:  Shortness of breath. History of AFib. History of COPD. EXAM: PORTABLE CHEST 1 VIEW COMPARISON:  Chest x-ray 03/07/2021. FINDINGS: Cardiomegaly. Bilateral interstitial prominence, right side greater than left. Findings suggest interstitial edema. Pneumonitis cannot be excluded. Small right pleural effusion. COPD. No pneumothorax. IMPRESSION: 1.  Cardiomegaly. Bilateral interstitial prominence, right side greater than left. Findings suggest interstitial edema. Pneumonitis can not be excluded. Small right pleural effusion. 2.  COPD. Electronically Signed   By: Marcello Moores  Register   On: 05/27/2021 12:59    Cardiac Studies   Echo 10/23/2020 1. Left ventricular ejection fraction, by estimation, is 55 to 60%. The  left ventricle has normal function. The left ventricle has no regional  wall motion abnormalities. Left ventricular diastolic parameters are  consistent with Grade I diastolic  dysfunction (impaired relaxation). There is the interventricular septum is  flattened in diastole ('D' shaped left ventricle), consistent with right  ventricular volume overload.   2. Right ventricular systolic function is low normal. The right  ventricular size is normal.   3. The mitral valve is grossly normal. No evidence of mitral valve  regurgitation.   4. The aortic valve is tricuspid. There is mild calcification of the  aortic valve. There is mild thickening of the aortic valve. Aortic valve  regurgitation is not visualized.   5. The inferior vena cava is normal in size with greater than 50%  respiratory variability, suggesting right atrial pressure of 3 mmHg.    Patient Profile     75 y.o. female with history of paroxysmal atrial fibrillation diagnosed in January 2022 on Xarelto, tobacco abuse, COPD on home O2, bilateral ankle wound, chronic venous stasis dermatitis, and hyponatremia who presented with worsening shortness of breath and hypotension found to have RLL pneumonia and infected bilateral ankle wounds with concern for septic shock as well as Afib with RVR. Cardiology is now consulted for Afib with RVR.  Assessment & Plan    #Atrial fibrillation with RVR: CHADs-vasc 4. Has known diagnosis of Afib that was initially diagnosed during a hospitalization in 11/2020 at which point she was started on metop and xarelto. During this admission,  patient presented with Afib with RVR in the setting of septic shock. Attempted DCCV x3 in the ER without success and therefore placed on amiodarone gtt. Home xarelto change to heparin gtt for [facility]. Overnight, patient converted to NSR.   -Change from amiodarone gtt to PO amiodarone 270m BID x14 days and then 2050mPO daily thereafter -Continue heparin gtt for now; can resume xarelto per critical care team -Unable to tolerate metop due to hypotension -Management of septic shock per primary team  #Septic Shock: #RLL Pneumonia: #Infected Bilateral LE Wounds: Patient presented with worsening SOB and hypotension found to have RLL pneumonia and infected bilateral LE wounds concerning for septic shock. -Management per critical care team -Remains on low dose levo; weaning as tolerated -Continue broad spectrum ABX  #Troponin Elevation Suspect demand in the setting of Afib with RVR and septic shock with low suspicion for  ACS. -Manage Afib and septic shock as above  #Acute COPD Exacerbation: #Chronic Tobacco Use: -Management per critical care team      For questions or updates, please contact [facility] Please consult [email or web address] for contact info under        Signed, Freada Bergeron, MD  05/28/2021, 7:54 AM

## 2021-05-27 NOTE — Consult Note (Addendum)
Cardiology Consultation:   Patient ID: Kristen [name] MRN: [id number]; DOB: October 07, 1946  Admit date: 05/27/2021 Date of Consult: 05/27/2021  PCP:  [name], MD   [place] [facility] Cardiologist:  New - Dr. Johney Frame     Patient Profile:   Kristen [name] is a 75 y.o. female with a hx of paroxysmal atrial fibrillation diagnosed in January 2022 on Xarelto, tobacco abuse, COPD on home O2, bilateral ankle wound, chronic venous stasis dermatitis, and hyponatremia who is being seen 05/27/2021 for the evaluation of atrial fibrillation with RVR at the request of Dr. Tacy Learn.  History of Present Illness:   Kristen [name] is a 75 year old female with past medical history of paroxysmal atrial fibrillation diagnosed in January 2022 on Xarelto, tobacco abuse, COPD on home O2, bilateral ankle wound, chronic venous stasis dermatitis, and hyponatremia.  Patient has had multiple admissions since August 2021.  She says she has a chronic diagnosis of COPD however never required home oxygen until last August when she was admitted for COPD exacerbation.  She was admitted twice for COPD exacerbation in November 2021 and January and again in April 2022.  During her admission in January 2022, she had acute onset of paroxysmal atrial fibrillation and was placed on both metoprolol and 15 mg daily of Xarelto.  Subsequent EKG obtained in February 2022 shows the patient has already converted back to sinus rhythm.  According to patient, she is a chronic smoker, she tried to quit in the past however always restarted on tobacco.  She has been compliant with her home medication.  She lives with her sister and has no problem ambulate during the day.  She has bilateral lower extremity ankle wound related to venous stasis.  Last echocardiogram obtained on 10/23/2020 showed EF 55 to 60%, grade 1 DD, flattened interventricular septum in diastole consistent with right ventricular volume overload, no significant valve issue, normal left  atrial size.  According to the patient, she is chronically short of breath however this morning she had increased shortness of breath with minimal activity.  This prompted her to seek medical attention at [facility], ED.  On arrival, she was noted to be back in atrial fibrillation with RVR and was hypotensive.  She is short of breath and has significant weakness.  Cardioversion was attempted in the emergency room 3 times without success.  Lactic acid was 2.6. procalcitonin elevated at 0.75. Serial enzyme borderline elevated.  Patient has pleuritic chest pain with deep inspiration.  White blood cell count 24.2.  Chest x-ray showed bilateral interstitial prominence, right greater than left suggestive of interstitial edema, small right pleural effusion, COPD present.  Patient has been admitted for either cardiogenic versus septic shock and was placed on IV pressors.  Due to hypotension, rate control medication was unable to be added.   Patient was started on IV amiodarone.  Cardiology service has been consulted for management of atrial fibrillation.   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 Authorizing Provider  albuterol (VENTOLIN HFA) 108 (90  Base) MCG/ACT inhaler INHALE 1-2 PUFFS BY MOUTH EVERY 6 HOURS AS NEEDED FOR WHEEZE OR SHORTNESS OF BREATH Patient taking differently: Inhale 2 puffs into the lungs every 6 (six) hours as needed for wheezing or shortness of breath. 01/13/21   Agyei, Caprice Kluver, MD  busPIRone (BUSPAR) 10 MG tablet Take 10 mg by mouth daily. 03/02/21   [provider]  fluticasone (FLONASE) 50 MCG/ACT nasal spray Place 1 spray into both nostrils daily as needed for allergies or rhinitis. 03/31/21   Katsadouros, Vasilios, MD  INCRUSE ELLIPTA 62.5 MCG/INH AEPB INHALE 1 PUFF BY MOUTH EVERY DAY 04/08/21   Gaylan Gerold, DO  ipratropium-albuterol (DUONEB) 0.5-2.5 (3) MG/3ML SOLN TAKE 3 MLS BY NEBULIZATION EVERY 6 (SIX) HOURS AS NEEDED (SHORTNESS OF BREATH/WHEEZING.). 04/08/21 07/07/21  Gaylan Gerold, DO  metoprolol succinate (TOPROL-XL) 25 MG 24 hr tablet Take 2 tablets (50 mg total) by mouth daily. 03/04/21   Harvie Heck, MD  mirtazapine (REMERON) 15 MG tablet Take 1 tablet (15 mg total) by mouth at bedtime as needed (sleep). 03/18/21   Mosetta Anis, MD  mometasone-formoterol (DULERA) 200-5 MCG/ACT AERO Inhale 1 puff into the lungs in the morning and at bedtime. 05/05/21   [name], MD  Multiple Vitamin (MULTIVITAMIN WITH MINERALS) TABS tablet Take 1 tablet by mouth daily.    [provider]  ondansetron (ZOFRAN) 4 MG tablet Take 1 tablet (4 mg total) by mouth daily as needed for nausea or vomiting. 05/05/21   [name], MD  oxyCODONE-acetaminophen (PERCOCET) 10-325 MG tablet Take 1 tablet by mouth every 8 (eight) hours as needed for pain. 05/05/21 06/04/21  [name], MD  OXYGEN Inhale 3 L/min into the lungs.    [provider]  predniSONE (DELTASONE) 20 MG tablet Take 2 tablets (40 mg total) by mouth daily with breakfast. 03/10/21   Virl Axe, MD  XARELTO 15 MG TABS tablet TAKE 1 TABLET (15 MG TOTAL) BY MOUTH DAILY. 04/08/21   Gaylan Gerold, DO    Inpatient Medications: Scheduled Meds:  arformoterol  15 mcg Nebulization BID   budesonide (PULMICORT) nebulizer solution  0.5 mg Nebulization BID   revefenacin  175 mcg Nebulization Daily   Continuous Infusions:  amiodarone 60 mg/hr (05/27/21 1603)   Followed by   amiodarone     ampicillin-sulbactam (UNASYN) IV     heparin     lactated ringers     norepinephrine  (LEVOPHED) Adult infusion 2 mcg/min (05/27/21 1552)   PRN Meds: docusate sodium, ipratropium-albuterol, polyethylene glycol  Allergies:    Allergies  Allergen Reactions   Gabapentin Other (See Comments)    Hallucination/ nightmares    Social History:   Social History   Socioeconomic History   Marital status: Divorced    Spouse name: Not on file   Number of children: Not on file   Years of education: Not on file   Highest education level: Not on file  Occupational History    Comment: RETIRED  Tobacco Use   Smoking status: Former    Packs/day: 0.25    Pack years: 0.00    Types: Cigarettes    Quit date: 10/16/2019    Years since quitting: 1.6   Smokeless tobacco: Never   Tobacco comments:    2-3 per day   Vaping Use   Vaping Use: Never used  Substance and Sexual Activity   Alcohol use: Yes    Comment: occasionally   Drug use: Never   Sexual activity: Not on file  Other Topics Concern   Not on file  Social History Narrative   Not on file   Social Determinants of Health   Financial Resource Strain: Not on file  Food Insecurity: Not on file  Transportation Needs: Not on file  Physical Activity: Not on file  Stress: Not on file  Social Connections: Not on file  Intimate Partner Violence: Not on file    Family History:    Family History  Problem Relation Age of Onset   Diabetes Mother    Diabetes Mellitus II Mother    Diabetes Father    Diverticulitis Sister      ROS:  Please see the history of present illness.   All other ROS reviewed and negative.     Physical Exam/Data:   Vitals:   05/27/21 1615 05/27/21 1620 05/27/21 1625 05/27/21 1630  BP: (!) 86/62 (!) 87/65 (!) 85/60 (!) 86/63  Pulse: (!) 114 (!) 110 (!) 110 (!) 115  Resp: (!) 26 (!) 25 (!) 25 (!) 26  Temp:      TempSrc:      SpO2: 94% 96% 95% 95%  Weight:      Height:        Intake/Output Summary (Last 24 hours) at 05/27/2021 1717 Last data filed at 05/27/2021 1532 Gross per 24 hour   Intake 1250 ml  Output --  Net 1250 ml   Last 3 Weights 05/27/2021 05/05/2021 03/07/2021  Weight (lbs) 90 lb 90 lb 4.8 oz 95 lb 14.4 oz  Weight (kg) 40.824 kg 40.96 kg 43.5 kg     Body mass index is 12.91 kg/m.  General: Cachectic, frail appearing malnourished HEENT: normal Lymph: no adenopathy Neck: no JVD Endocrine:  No thryomegaly Vascular: No carotid bruits; FA pulses 2+ bilaterally without bruits  Cardiac:  normal S1, S2; irregular, tachycardic; no murmur  Lungs: Decreased breath sound bilaterally, no obvious crackles Abd: soft, nontender, no hepatomegaly  Ext: no edema Musculoskeletal:  No deformities, BUE and BLE strength normal and equal Skin: warm and dry  Neuro:  CNs 2-12 intact, no focal abnormalities noted Psych:  Normal affect   EKG:  The EKG was personally reviewed and demonstrates: Atrial fibrillation with RVR. Telemetry:  Telemetry was personally reviewed and demonstrates: Atrial fibrillation with RVR.  Relevant CV Studies:  Echo 10/23/2020 1. Left ventricular ejection fraction, by estimation, is 55 to 60%. The  left ventricle has normal function. The left ventricle has no regional  wall motion abnormalities. Left ventricular diastolic parameters are  consistent with Grade I diastolic  dysfunction (impaired relaxation). There is the interventricular septum is  flattened in diastole ('D' shaped left ventricle), consistent with right  ventricular volume overload.   2. Right ventricular systolic function is low normal. The right  ventricular size is normal.   3. The mitral valve is grossly normal. No evidence of mitral valve  regurgitation.   4. The aortic valve is tricuspid. There is mild calcification of the  aortic valve. There is mild thickening of the aortic valve. Aortic valve  regurgitation is not visualized.   5. The inferior vena cava is normal in size with greater than 50%  respiratory variability, suggesting right atrial pressure of 3 mmHg.    Laboratory Data:  High Sensitivity Troponin:   Recent Labs  Lab 05/27/21 1220 05/27/21 1519  TROPONINIHS 18* 51*     Chemistry Recent Labs  Lab 05/27/21 1220  NA 128*  K 4.4  CL 89*  CO2 27  GLUCOSE 130*  BUN 12  CREATININE 0.81  CALCIUM 8.3*  GFRNONAA >60  ANIONGAP 12    Recent Labs  Lab 05/27/21 1220  PROT 5.4*  ALBUMIN 1.9*  AST 17  ALT 8  ALKPHOS 124  BILITOT 1.1   Hematology Recent Labs  Lab 05/27/21 1220  WBC 24.2*  RBC 3.65*  HGB 10.7*  HCT 34.8*  MCV 95.3  MCH 29.3  MCHC 30.7  RDW 13.1  PLT 544*   BNP Recent Labs  Lab 05/27/21 1220  BNP 526.6*    DDimer No results for input(s): DDIMER in the last 168 hours.   Radiology/Studies:  DG Chest Portable 1 View  Result Date: 05/27/2021 CLINICAL DATA:  Shortness of breath. History of AFib. History of COPD. EXAM: PORTABLE CHEST 1 VIEW COMPARISON:  Chest x-ray 03/07/2021. FINDINGS: Cardiomegaly. Bilateral interstitial prominence, right side greater than left. Findings suggest interstitial edema. Pneumonitis cannot be excluded. Small right pleural effusion. COPD. No pneumothorax. IMPRESSION: 1. Cardiomegaly. Bilateral interstitial prominence, right side greater than left. Findings suggest interstitial edema. Pneumonitis can not be excluded. Small right pleural effusion. 2.  COPD. Electronically Signed   By: Marcello Moores  Register   On: 05/27/2021 12:59     Assessment and Plan:   Atrial fibrillation with RVR  - no clear cardiac awareness of palpitation. Does feel SOB and fatigued more than usual since this morning - first diagnosed in Jan 2022, started on Xarelto and toprol XL. EKG in Feb 2022 shows sinus rhythm.  - she was tachycardic and hypotensive on arrival. Started on IV pressors and IV amio after 3 failed cardioversion. Likely in the setting of lung issue - she is not a good candidate for long term amiodarone use to severe COPD at baseline - consider 2 days of IV amiodarone, then attempt  cardioversion this Friday again. On IV heparin, but may restart Xarelto at any time.   Acute COPD exacerbation  - CXR shows possible interstitial edema, but she does not appears to be significantly volume overloaded. Hold off on diuretic given severe hypotension  Hypotension: In the setting of atrial fibrillation with RVR.   - concern for possible septic shock vs cardiogenic shock. Started on pressors and abx  - WBC 24.2 on arrival. WBC was normal in Apr  - managed by PCCM  Chronic tobacco abuse  Bilateral ankle wound  Elevated trop with pleuritic chest pain: likely related to COPD exacerbation.      Risk Assessment/Risk Scores:     HEAR Score (for undifferentiated chest pain):  HEAR Score: 3    CHA2DS2-VASc Score = 4  This indicates a 4.8% annual risk of stroke. The patient's score is based upon: CHF History: No HTN History: No Diabetes History: No Stroke History: No Vascular Disease History: Yes Age Score: 2 Gender Score: 1         For questions or updates, please contact [place] Please consult [email or web address] for contact info under    Signed, Almyra Deforest, [place]  05/27/2021 5:17 PM  Patient seen and examined and agree with Almyra Deforest, PA as detailed above.  In brief, the patient is a 75 year old female with history of paroxysmal atrial fibrillation diagnosed in January 2022 on Xarelto, tobacco abuse, COPD on home O2, bilateral ankle wound, chronic venous stasis dermatitis, and hyponatremia who presented with worsening shortness of breath and hypotension found to have RLL pneumonia and infected bilateral ankle wounds with concern for septic shock as well as Afib with RVR. Cardiology is now consulted for  Afib with RVR.  On presentation to the ER, lactic acid 2.6, procalcitonin 0.75, WBC 24.2. Initial concern that Afib with RVR may be driving hypotension and DCCV was attempted x3 without success. After further review, determined that patent likely had septic shock in the  setting of pneumonia and bilateral LE infected wounds. She was placed on amiodarone gtt, broad spectrum ABX and volume resuscitated. Currently HR better controlled in low 100s. Last TTE12/11/2019 showed EF 55 to 60%, grade 1 DD, flattened interventricular septum in diastole consistent with right ventricular volume overload, no significant valve issue, normal left atrial size. Suspect RVR driven by sepsis. Will continue amiodarone for now and manage sepsis per critical care team as likely HR will improve once she clinically improves from an infectious standpoint.  GEN: Cachectic female, chronically ill appearing  Neck: No JVD Cardiac: Irregularly irregular, no murmurs  Respiratory: Scattered expiratory wheezes, crackles at right lung base GI: Thin, soft MS: Bilateral LE ankle wounds Neuro:  Nonfocal  Psych: Normal affect    Plan: -Management of septic shock per primary team -Failed DCCV x3 attempts in the ER -Continue amiodarone gtt for rate control; likely HR will improve with improvement from an infectious standpoint -Can give dig if needed for additional rate control if needed -Changed from xarelto to heparin gtt for [facility] -Once improved from infectious standpoint, can repeat attempt at DCCV if remains in Afib -Cannot tolerate BB/CCB due to hypotension -IVF resuscitation per critical care team; no evidence of volume overload on exam  Gwyndolyn Kaufman, MD

## 2021-05-28 ENCOUNTER — Inpatient Hospital Stay (HOSPITAL_COMMUNITY): Payer: Medicare Other

## 2021-05-28 ENCOUNTER — Encounter (HOSPITAL_COMMUNITY): Payer: Self-pay | Admitting: Internal Medicine

## 2021-05-28 DIAGNOSIS — I4891 Unspecified atrial fibrillation: Secondary | ICD-10-CM | POA: Diagnosis not present

## 2021-05-28 DIAGNOSIS — J449 Chronic obstructive pulmonary disease, unspecified: Secondary | ICD-10-CM | POA: Diagnosis not present

## 2021-05-28 LAB — BASIC METABOLIC PANEL
Anion gap: 6 (ref 5–15)
BUN: 13 mg/dL (ref 8–23)
CO2: 28 mmol/L (ref 22–32)
Calcium: 7.9 mg/dL — ABNORMAL LOW (ref 8.9–10.3)
Chloride: 95 mmol/L — ABNORMAL LOW (ref 98–111)
Creatinine, Ser: 0.58 mg/dL (ref 0.44–1.00)
GFR, Estimated: >60 mL/min (ref >60)
Glucose, Bld: 104 mg/dL — ABNORMAL HIGH (ref 70–99)
Potassium: 3.8 mmol/L (ref 3.5–5.1)
Sodium: 129 mmol/L — ABNORMAL LOW (ref 135–145)

## 2021-05-28 LAB — MAGNESIUM: Magnesium: 2.1 mg/dL (ref 1.7–2.4)

## 2021-05-28 LAB — CBC
HCT: 31 % — ABNORMAL LOW (ref 36.0–46.0)
Hemoglobin: 9.7 g/dL — ABNORMAL LOW (ref 12.0–15.0)
MCH: 29.1 pg (ref 26.0–34.0)
MCHC: 31.3 g/dL (ref 30.0–36.0)
MCV: 93.1 fL (ref 80.0–100.0)
Platelets: 509 10*3/uL — ABNORMAL HIGH (ref 150–400)
RBC: 3.33 MIL/uL — ABNORMAL LOW (ref 3.87–5.11)
RDW: 13.4 % (ref 11.5–15.5)
WBC: 21.9 10*3/uL — ABNORMAL HIGH (ref 4.0–10.5)
nRBC: 0 % (ref 0.0–0.2)

## 2021-05-28 LAB — URINALYSIS, ROUTINE W REFLEX MICROSCOPIC
Bilirubin Urine: NEGATIVE
Glucose, UA: NEGATIVE mg/dL
Hgb urine dipstick: NEGATIVE
Ketones, ur: NEGATIVE mg/dL
Leukocytes,Ua: NEGATIVE
Nitrite: NEGATIVE
Protein, ur: NEGATIVE mg/dL
Specific Gravity, Urine: 1.024 (ref 1.005–1.030)
pH: 5 (ref 5.0–8.0)

## 2021-05-28 LAB — HEPARIN LEVEL (UNFRACTIONATED): Heparin Unfractionated: 0.79 IU/mL — ABNORMAL HIGH (ref 0.30–0.70)

## 2021-05-28 LAB — PHOSPHORUS: Phosphorus: 2.6 mg/dL (ref 2.5–4.6)

## 2021-05-28 LAB — PROCALCITONIN: Procalcitonin: 1.73 ng/mL

## 2021-05-28 LAB — APTT: aPTT: 43 seconds — ABNORMAL HIGH (ref 24–36)

## 2021-05-28 MED ORDER — ADULT MULTIVITAMIN W/MINERALS CH
1.0000 | ORAL_TABLET | Freq: Every day | ORAL | Status: DC
  Administered 2021-05-28 – 2021-06-16 (×20): 1 via ORAL
  Filled 2021-05-28 (×21): qty 1

## 2021-05-28 MED ORDER — FAMOTIDINE 20 MG PO TABS
20.0000 mg | ORAL_TABLET | Freq: Every day | ORAL | Status: DC
  Administered 2021-05-28 – 2021-06-17 (×21): 20 mg via ORAL
  Filled 2021-05-28 (×21): qty 1

## 2021-05-28 MED ORDER — PROSOURCE PLUS PO LIQD
30.0000 mL | Freq: Two times a day (BID) | ORAL | Status: DC
  Administered 2021-05-28 – 2021-05-31 (×5): 30 mL via ORAL
  Filled 2021-05-28 (×15): qty 30

## 2021-05-28 MED ORDER — ACETAMINOPHEN 325 MG PO TABS: 650.0000 mg | ORAL_TABLET | Freq: Four times a day (QID) | ORAL | Status: DC | PRN

## 2021-05-28 MED ORDER — RIVAROXABAN 15 MG PO TABS
15.0000 mg | ORAL_TABLET | Freq: Every day | ORAL | Status: DC
  Administered 2021-05-28 – 2021-06-17 (×21): 15 mg via ORAL
  Filled 2021-05-28 (×23): qty 1

## 2021-05-28 MED ORDER — BOOST / RESOURCE BREEZE PO LIQD CUSTOM
1.0000 | Freq: Two times a day (BID) | ORAL | Status: DC
  Administered 2021-05-28 – 2021-05-31 (×4): 1 via ORAL

## 2021-05-28 MED ORDER — HEPARIN (PORCINE) 25000 UT/250ML-% IV SOLN: 700.0000 [IU]/h | INTRAVENOUS | Status: AC

## 2021-05-28 MED ORDER — OXYCODONE-ACETAMINOPHEN 7.5-325 MG PO TABS
1.0000 | ORAL_TABLET | Freq: Four times a day (QID) | ORAL | Status: DC | PRN
  Administered 2021-05-28 – 2021-06-17 (×72): 1 via ORAL
  Filled 2021-05-28 (×72): qty 1

## 2021-05-28 MED ORDER — AMIODARONE IV BOLUS ONLY 150 MG/100ML
150.0000 mg | Freq: Once | INTRAVENOUS | Status: AC
  Administered 2021-05-28: 150 mg via INTRAVENOUS
  Filled 2021-05-28: qty 100

## 2021-05-28 MED ORDER — AMIODARONE HCL 200 MG PO TABS
200.0000 mg | ORAL_TABLET | Freq: Two times a day (BID) | ORAL | Status: DC
  Administered 2021-05-28 (×2): 200 mg via ORAL
  Filled 2021-05-28 (×2): qty 1

## 2021-05-28 NOTE — Progress Notes (Signed)
NAME:  Kristen [name], MRN:  [id number], DOB:  [birth year], LOS: 1 ADMISSION DATE:  05/27/2021, CONSULTATION DATE:  7/5 REFERRING MD:  Dr. Laverta Baltimore, CHIEF COMPLAINT:  Afib RVR   History of Present Illness:  Patient is a 75 yo F w/ pertinent PMH of Afib (on xarelto and metoprolol), systolic CHF (EF 50-93%) and COPD presenting to [facility] on 7/5 w/ Afib w/ RVR.  Patient has had admitted several times in the past year for COPD exacerbations. Recently admitted 03/07/2021. Previous Echo 10/2020: EF 55-60%; Grade 1 diastolic; D shaped LV consistent w/ RV volume overload.  Patient states she has had symptoms of body aches and not feeling well for about a week. She present to [facility] on 7/5 via EMS for worsening chest achiness and some SOB. States she has been compliant w/ medications. Did not take Xarelto today. EKG shows Afib RVR. BP 72/58. O2 sats in 80s on 3 l/m [place]. Increased sputum production. No swelling, fever. Negative covid/flu. BCX2 pending. CXR shows RLL infiltrate vs. Pulmonary edema. WBC 24, Na 128, Hgb 10.7, Lactic acid 2.6.   EMS gave 10 mg of Cardizem en route with temporary improvement in HR. BP 80-90s on arrival to [facility] ED. Attempted cardioversion x3 with NSR briefly, but back to Afib RVR. Ongoing hypotension post fluid bolus. Cardiology consulted.   PCCM consulted for admission and medical management.  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 Fluid bolus given. Started Amio drip. Unasyn started for aspiration ppx.   Interim History / Subjective:  Afebrile; WBC trending down BP stable 113/62 on amio and 1 mcg levo HR 60-70s Patient feeling better asking for food this morning. On 3 liters [place]. Congested cough  Objective   Blood pressure 113/62, pulse 68, temperature 97.7 F (36.5 C), temperature source Oral, resp. rate (!) 32, height _0  (1.778 m), weight 41.7 kg, SpO2 96 %.        Intake/Output Summary (Last 24 hours) at 05/28/2021 0712 Last data filed at 05/28/2021 0600 Gross per 24 hour  Intake 2118.06 ml  Output 700 ml  Net 1418.06 ml    Filed Weights   05/27/21 1222 05/27/21 1707 05/28/21 0600  Weight: 40.8 kg 41.6 kg 41.7 kg    Examination: General:  Cachectic appearing; NAD HEENT: MM pink/moist; [place] in place Neuro: aox4 CV: s1s2, RRR, no m/r/g appreciated PULM:  dim bs bilaterally;  3 l/m; o2 sats 98% GI: soft, bsx4 active  Extremities: warm/dry, no edema; ankles wrapped covering medial malleolar ulcerations  Labs: Na 129 (from 128) K 3.8, Mag 2.1 Lactic acid 1.7 (from 2.6) Procalcitonin 1.73 WBC 21.9 (from 24) Hgb 9.7 CXR: RLL infiltrate/edema still present BC showing Gram + rods  Resolved Hospital Problem list     Assessment & Plan:   Acute on chronic Afib RVR Cardiogenic shock vs. Septic shock Chronic HFrEF PAD: Echo 10/2020-EF 55-60%; Grade 1 diastolic; D  shaped LV consistent w/ RV volume overload. P: -Cards following: recommend not a good candidate for long term amiodarone; recommend IV amio 2 days and re-attempt cardioversion Friday if needed. -stopping IV heparin and switching to home med Xarelto -Wean levo to off for MAP >65 -trend electrolytes and Bcx2 -consider broadening abx when more BC become available as first set likely contaminant; trend fever/wbc curve -consider restarting home metoprolol when BP stable  COPD PAD: Wears 3 liters at home Possible aspiration  pneumonia Chronic Tobacco abuse P: -continue yupelri, brovana, pulmicort -prn duoneb -continue home 3 liters  for sats >88% -continue Unasyn for aspiration ppx -CPT and pulmonary toiletry -OOB as tolerated; PT consult  Chronic BLE medial malleolus ulceration PAD Chronic venous insufficiency P: -wound care consult sent -continue unasyn for anaerobic coverage  Chronic Hyponatremia: 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  P: -Trend BMP -consider further workup  Protein calorie malnutrition BMI 12.9 P: -supportive care -advance diet as tolerated today  Best Practice (right click and "Reselect all SmartList Selections" daily)   Diet/type: clear liquids DVT prophylaxis: DOAC GI prophylaxis: H2B Lines: N/A Foley:  N/A Code Status:  full code Last date of multidisciplinary goals of care discussion [7/6 no family at bedside; updated patient on plan of care]   Critical care time: 26    JD Rexene Agent [facility] 05/28/2021, 7:12 AM  Please see [email or web address] for pager details.  From 7A-7P if no response, please call [phone]. After hours, please call ELink [phone].

## 2021-05-28 NOTE — Progress Notes (Signed)
Initial Nutrition Assessment  DOCUMENTATION CODES:   Underweight, Severe malnutrition in context of chronic illness  INTERVENTION:   Boost Breeze po BID, each supplement provides 250 kcal and 9 grams of protein 30 ml ProSource Plus BID, each supplement provides 100 kcals and 15 grams protein.  MVI with minerals daily   NUTRITION DIAGNOSIS:   Severe Malnutrition related to chronic illness (COPD/CHF) as evidenced by severe fat depletion, severe muscle depletion, energy intake < or equal to 75% for > or equal to 1 month.  GOAL:   Patient will meet greater than or equal to 90% of their needs  MONITOR:   PO intake, Supplement acceptance, Weight trends, Labs, I & O's  REASON FOR ASSESSMENT:   Rounds    ASSESSMENT:   Patient with PMH significant for afib, CHF, chronic hyponatremia (possible SIADH from opioid therapy), and COPD with pulmonary cachexia. Admitted several times in the last year for COPD exacerbation. Presents this admission with afib RVR.  Patient endorses decreased intake two days PTA due to ongoing lethargy. States during this time she consumed mostly bites and sips. Prior to this she was able to consume three small meals daily that consisted of B- grits or oatmeal with eggs L- premier protein D- roast, greens, and mashed potatoes. She wasn't able to have breakfast this am due to NPO status and is looking forward to lunch. RD discussed need for high calorie high protein supplement versus high protein low calorie (such as Premier Protein). Patient does not like Ensure or Boost Plus but was willing to try Boost Breeze. Sampled with patient at bedside, willing to take two daily.   Patient reports a UBW of 130 lb and 30 lb weight loss over the last year. Records indicate weight has fluctuated between 85-95 over the last 11 months. Suspect dry weight loss but unable to quantify given history of CHF.   UOP: 700 ml x 24 hrs   Medications: reviewed  Labs: Na 129 (L)    NUTRITION - FOCUSED PHYSICAL EXAM:  Flowsheet Row Most Recent Value  Orbital Region Severe depletion  Upper Arm Region Severe depletion  Thoracic and Lumbar Region Severe depletion  Buccal Region Severe depletion  Temple Region Severe depletion  Clavicle Bone Region Severe depletion  Clavicle and Acromion Bone Region Severe depletion  Scapular Bone Region Severe depletion  Dorsal Hand Severe depletion  Patellar Region Severe depletion  Anterior Thigh Region Severe depletion  Posterior Calf Region Severe depletion  Edema (RD Assessment) None  Hair Reviewed  Eyes Reviewed  Mouth Reviewed  Skin Reviewed  Nails Reviewed      Diet Order:   Diet Order             Diet regular Room service appropriate? Yes with Assist; Fluid consistency: Thin  Diet effective now                   EDUCATION NEEDS:   Education needs have been addressed  Skin:  Skin Assessment: Skin Integrity Issues: Skin Integrity Issues:: Other (Comment) Other: venous stasis R/L leg  Last BM:  7/5  Height:   Ht Readings from Last 1 Encounters:  05/27/21 _0  (1.778 m)    Weight:   Wt Readings from Last 1 Encounters:  05/28/21 41.7 kg    BMI:  Body mass index is 13.19 kg/m.  Estimated Nutritional Needs:   Kcal:  1300-1500 kcal  Protein:  70-90 grams  Fluid:  >/= 1.3 L/day  Mariana Single MS, RD,  LDN, CNSC Clinical Nutrition Pager listed in [place]

## 2021-05-28 NOTE — Progress Notes (Signed)
Patient's HR noted to be increasing to 110-120's. Appears to be in Afib. Dr. Lake Bells on unit and notified at this time. One time order for amio bolus 111m received. Patient BP stable, remains asymptomatic.   EJoellen Jersey RN

## 2021-05-28 NOTE — Progress Notes (Signed)
Patient refused CPT with bed didn't like the percussion.  Will get patient a flutter valve to do on her own.

## 2021-05-28 NOTE — Consult Note (Signed)
[place] Nurse Consult Note: Patient receiving care in [facility] 2H18.  Reason for Consult: bilateral medial malleolar wounds Wound type: venous insufficiency Pressure Injury POA: Yes/No/NA Measurement: To be provided by the bedside RN in the flowsheet section  Wound bed: see photos Drainage (amount, consistency, odor) some on gauze dressing in photo Periwound: Dressing procedure/placement/frequency: Wash bilateral medial ankle wounds with saline, pat dry. Place Xeroform gauze over each wound, then beginning behind the toes and going to just below the knees, spiral wrap kerlex, then 4 inch ace wrap. Perform daily.   I have also entered an order to float the heels to prevent PI to those areas.  Monitor the wound area(s) for worsening of condition such as: Signs/symptoms of infection,  Increase in size,  Development of or worsening of odor, Development of pain, or increased pain at the affected locations.  Notify the medical team if any of these develop.  Thank you for the consult.  [place] nurse will not follow at this time.  Please re-consult the [place] team if needed.  Val Riles, RN, MSN, CWOCN, CNS-BC, pager [phone]

## 2021-05-28 NOTE — Progress Notes (Signed)
[facility] Progress Note Patient Name: Kristen [name] DOB: 1946-01-22 MRN: [id number]   Date of Service  05/28/2021  HPI/Events of Note  Patient with Afib with RVR ( rate 120-130), Amiodarone gtt was discontinued today and he was started on oral Amiodarone after her converted to sinus rhythm but he's back in Afib. BP 101/71. MAP 80.  eICU Interventions  Will order Amiodarone 150 mg iv bolus x 1.        [name] 05/28/2021, 10:42 PM

## 2021-05-28 NOTE — Progress Notes (Signed)
ANTICOAGULATION CONSULT NOTE  Pharmacy Consult for heparin Indication: atrial fibrillation  Allergies  Allergen Reactions   Gabapentin Other (See Comments)    Hallucination/ nightmares    Patient Measurements: Height: _0  (177.8 cm) Weight: 41.7 kg (91 lb 14.9 oz) IBW/kg (Calculated) : 68.5 Heparin Dosing Weight: TBW  Vital Signs: Temp: 97.7 F (36.5 C) (07/06 0414) Temp Source: Oral (07/06 0414) BP: 111/62 (07/06 0600) Pulse Rate: 68 (07/06 0600)  Labs: Recent Labs    05/27/21 1220 05/27/21 1519 05/28/21 0437  HGB 10.7*  --  9.7*  HCT 34.8*  --  31.0*  PLT 544*  --  509*  APTT  --   --  43*  LABPROT 18.2*  --   --   INR 1.5*  --   --   HEPARINUNFRC  --   --  0.79*  CREATININE 0.81  --  0.58  TROPONINIHS 18* 51*  --      Estimated Creatinine Clearance: 39.4 mL/min (by C-G formula based on SCr of 0.58 mg/dL).  Assessment: 75 y.o. female with h/o Afib, Xarelto on hold, for heparin  Goal of Therapy:  Heparin level 0.3-0.7 units/ml aPTT 66-102 seconds Monitor platelets by anticoagulation protocol: Yes   Plan:  Increase Heparin 700 units/hr aPTT in 8 hours  Phillis Knack, PharmD, BCPS  05/28/2021 6:53 AM

## 2021-05-28 NOTE — Progress Notes (Signed)
PHARMACY - PHYSICIAN COMMUNICATION CRITICAL VALUE ALERT - BLOOD CULTURE IDENTIFICATION (BCID)  Kristen [name] is an 75 y.o. female who presented to [facility] on 05/27/2021 with a chief complaint of SOB/sepsis/PNA  Assessment:  1/2 blood cultures growing Gram positive rods.  Likely contaminant  Name of physician (or Provider) Contacted:  Dr. Lucile Shutters  Current antibiotics:  Unasyn  Changes to prescribed antibiotics recommended:  No changes at this time  No results found for this or any previous visit.  Caryl Pina 05/28/2021  3:11 AM

## 2021-05-29 DIAGNOSIS — I4891 Unspecified atrial fibrillation: Secondary | ICD-10-CM | POA: Diagnosis not present

## 2021-05-29 DIAGNOSIS — J690 Pneumonitis due to inhalation of food and vomit: Secondary | ICD-10-CM

## 2021-05-29 DIAGNOSIS — J189 Pneumonia, unspecified organism: Secondary | ICD-10-CM | POA: Diagnosis present

## 2021-05-29 DIAGNOSIS — A419 Sepsis, unspecified organism: Secondary | ICD-10-CM | POA: Insufficient documentation

## 2021-05-29 DIAGNOSIS — E43 Unspecified severe protein-calorie malnutrition: Secondary | ICD-10-CM | POA: Diagnosis present

## 2021-05-29 LAB — MAGNESIUM: Magnesium: 1.5 mg/dL — ABNORMAL LOW (ref 1.7–2.4)

## 2021-05-29 LAB — GLUCOSE, CAPILLARY: Glucose-Capillary: 128 mg/dL — ABNORMAL HIGH (ref 70–99)

## 2021-05-29 LAB — CULTURE, BLOOD (ROUTINE X 2): Special Requests: ADEQUATE

## 2021-05-29 LAB — BASIC METABOLIC PANEL
Anion gap: 5 (ref 5–15)
BUN: 13 mg/dL (ref 8–23)
CO2: 30 mmol/L (ref 22–32)
Calcium: 8 mg/dL — ABNORMAL LOW (ref 8.9–10.3)
Chloride: 96 mmol/L — ABNORMAL LOW (ref 98–111)
Creatinine, Ser: 0.73 mg/dL (ref 0.44–1.00)
GFR, Estimated: >60 mL/min (ref >60)
Glucose, Bld: 111 mg/dL — ABNORMAL HIGH (ref 70–99)
Potassium: 3.5 mmol/L (ref 3.5–5.1)
Sodium: 131 mmol/L — ABNORMAL LOW (ref 135–145)

## 2021-05-29 LAB — CBC
HCT: 30.1 % — ABNORMAL LOW (ref 36.0–46.0)
Hemoglobin: 9.4 g/dL — ABNORMAL LOW (ref 12.0–15.0)
MCH: 28.7 pg (ref 26.0–34.0)
MCHC: 31.2 g/dL (ref 30.0–36.0)
MCV: 91.8 fL (ref 80.0–100.0)
Platelets: 502 10*3/uL — ABNORMAL HIGH (ref 150–400)
RBC: 3.28 MIL/uL — ABNORMAL LOW (ref 3.87–5.11)
RDW: 13.3 % (ref 11.5–15.5)
WBC: 9.2 10*3/uL (ref 4.0–10.5)
nRBC: 0 % (ref 0.0–0.2)

## 2021-05-29 LAB — PROCALCITONIN: Procalcitonin: 1.05 ng/mL

## 2021-05-29 MED ORDER — AMIODARONE HCL IN DEXTROSE 360-4.14 MG/200ML-% IV SOLN
30.0000 mg/h | INTRAVENOUS | Status: DC
  Administered 2021-05-29 – 2021-05-30 (×4): 30 mg/h via INTRAVENOUS
  Filled 2021-05-29 (×5): qty 200

## 2021-05-29 MED ORDER — SODIUM CHLORIDE 0.9 % IV SOLN
6.2500 mg | Freq: Four times a day (QID) | INTRAVENOUS | Status: AC | PRN
  Administered 2021-05-30: 6.25 mg via INTRAVENOUS
  Filled 2021-05-29 (×2): qty 0.25

## 2021-05-29 MED ORDER — POTASSIUM CHLORIDE CRYS ER 20 MEQ PO TBCR
40.0000 meq | EXTENDED_RELEASE_TABLET | Freq: Once | ORAL | Status: AC
  Administered 2021-05-29: 40 meq via ORAL
  Filled 2021-05-29: qty 2

## 2021-05-29 MED ORDER — MAGNESIUM SULFATE 4 GM/100ML IV SOLN
4.0000 g | Freq: Once | INTRAVENOUS | Status: AC
  Administered 2021-05-29: 4 g via INTRAVENOUS
  Filled 2021-05-29: qty 100

## 2021-05-29 MED ORDER — COSYNTROPIN 0.25 MG IJ SOLR
0.2500 mg | Freq: Once | INTRAMUSCULAR | Status: AC
  Administered 2021-05-30: 0.25 mg via INTRAVENOUS
  Filled 2021-05-29: qty 0.25

## 2021-05-29 MED ORDER — AMIODARONE LOAD VIA INFUSION
150.0000 mg | Freq: Once | INTRAVENOUS | Status: AC
  Administered 2021-05-29: 150 mg via INTRAVENOUS
  Filled 2021-05-29: qty 83.34

## 2021-05-29 MED ORDER — PROMETHAZINE HCL 25 MG RE SUPP
25.0000 mg | Freq: Four times a day (QID) | RECTAL | Status: AC | PRN
  Administered 2021-05-29: 25 mg via RECTAL
  Filled 2021-05-29 (×2): qty 1

## 2021-05-29 MED ORDER — AMIODARONE HCL IN DEXTROSE 360-4.14 MG/200ML-% IV SOLN
60.0000 mg/h | INTRAVENOUS | Status: AC
  Administered 2021-05-29 (×2): 60 mg/h via INTRAVENOUS
  Filled 2021-05-29: qty 200

## 2021-05-29 NOTE — Plan of Care (Signed)

## 2021-05-29 NOTE — Progress Notes (Signed)
RN discussed patient's possible urinary retention issues with IMTS resident. He stated to continue to frequently bladder scan. I&O cath for patient discomfort. Will monitor.  Joellen Jersey, RN

## 2021-05-29 NOTE — Progress Notes (Addendum)
HD#2 SUBJECTIVE:   Patient Summary: Kristen [name] is a 75 y.o. with a pertinent PMH of atrial fibrillation, lower extremity ulcers, COPD (3 L nasal cannula), GAD, who presented with tachycardia and shortness of breath and admitted for atrial fibrillation with RVR and aspiration pneumonia.  Patient was started on ampicillin-sulbactam for aspiration pneumonia and is on day 3 of antibiotic therapy.  She had 2 of 4 blood cultures positive for gram-positive rods secondary to bacillus species thought to be secondary to contamination.  She is clinically improving with downtrending of her WBC count to within normal limits, afebrile, and down to her home supplemental oxygen of 3 L nasal cannula.  She continues to have a difficult time with atrial fibrillation with RVR requiring IV amiodarone and multiple unsuccessful cardioversions.  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  On evaluation today, the patient states that she is feeling better. She continues to have a cough but feels as though her shortness of breathing has improved and is saturating well on 3 L nasal cannula.  She denies any fevers, chills, dizziness, chest pain, abdominal pain, or dysuria.     OBJECTIVE:   Vital Signs: Vitals:   05/29/21 1500 05/29/21 1515 05/29/21 1530 05/29/21 1558  BP: 119/68 112/63  120/75  Pulse: 71 70 66   Resp: 15 (!) 22 (!) 24 18  Temp:    98.1 F (36.7 C)  TempSrc:    Oral  SpO2: 100% 100% 100%   Weight:      Height:       Supplemental O2: Nasal Cannula SpO2: 100 % O2 Flow Rate (L/min): 3 L/min  Filed Weights   05/27/21 1222 05/27/21 1707 05/28/21 0600  Weight: 40.8 kg 41.6 kg 41.7 kg     Intake/Output Summary (Last 24 hours) at 05/29/2021 1815 Last data filed at 05/29/2021 1500 Gross per 24 hour  Intake 1561.35 ml  Output 600 ml  Net 961.35 ml   Net IO Since Admission: 3,153.13 mL  [05/29/21 1815]  Physical Exam: Physical Exam Constitutional:      Appearance: She is ill-appearing.     Comments: Cachectic  HENT:     Head: Normocephalic and atraumatic.     Mouth/Throat:     Mouth: Mucous membranes are moist.     Pharynx: Oropharynx is clear.  Cardiovascular:     Rate and Rhythm: Normal rate and regular rhythm.     Pulses: Normal pulses.     Heart sounds: Normal heart sounds.  Pulmonary:     Effort: Pulmonary effort is normal.     Breath sounds: Wheezing (expiratory wheezing) present.     Comments: Course breath sounds Abdominal:     General: Abdomen is flat. Bowel sounds are normal. There is no distension.     Tenderness: There is no abdominal tenderness.  Musculoskeletal:        General: No swelling.     Comments: Legs were wrapped with no obvious lower extremity edema  Skin:    General: Skin is warm and dry.  Neurological:     General: No focal deficit present.     Mental Status: She is alert and oriented to person, place, and time.    Patient Lines/Drains/Airways Status     Active Line/Drains/Airways     Name Placement date Placement time Site Days   Peripheral IV 05/27/21 20 G Left Antecubital 05/27/21  1225  Antecubital  2   Peripheral IV 05/27/21 20 G Right Antecubital 05/27/21  1310  Antecubital  2   Peripheral IV 05/27/21 20 G 1" Anterior;Left;Proximal;Upper Arm 05/27/21  1940  Arm  2   External Urinary Catheter 05/27/21  1707  --  2   Wound / Incision (Open or Dehisced) 02/24/21 Venous stasis ulcer Ankle Anterior;Right 02/24/21  1011  Ankle  94   Wound / Incision (Open or Dehisced) 05/27/21 Venous stasis ulcer Ankle Left;Medial 05/27/21  1707  Ankle  2            Labs: CBC Latest Ref Rng & Units 05/29/2021 05/28/2021 05/27/2021  WBC 4.0 - 10.5 K/uL 9.2 21.9(H) 24.2(H)  Hemoglobin 12.0 - 15.0 g/dL 9.4(L) 9.7(L) 10.7(L)  Hematocrit 36.0 - 46.0 % 30.1(L) 31.0(L) 34.8(L)  Platelets 150 - 400 K/uL 502(H) 509(H) 544(H)    CMP Latest Ref Rng  & Units 05/29/2021 05/28/2021 05/27/2021  Glucose 70 - 99 mg/dL 111(H) 104(H) 130(H)  BUN 8 - 23 mg/dL _0 Creatinine 0.44 - 1.00 mg/dL 0.73 0.58 0.81  Sodium 135 - 145 mmol/L 131(L) 129(L) 128(L)  Potassium 3.5 - 5.1 mmol/L 3.5 3.8 4.4  Chloride 98 - 111 mmol/L 96(L) 95(L) 89(L)  CO2 22 - 32 mmol/L _1 Calcium 8.9 - 10.3 mg/dL 8.0(L) 7.9(L) 8.3(L)  Total Protein 6.5 - 8.1 g/dL - - 5.4(L)  Total Bilirubin 0.3 - 1.2 mg/dL - - 1.1  Alkaline Phos 38 - 126 U/L - - 124  AST 15 - 41 U/L - - 17  ALT 0 - 44 U/L - - 8     ASSESSMENT/PLAN:   Assessment: Principal Problem:   Atrial fibrillation with RVR (HCC) Active Problems:   Venous insufficiency of both lower extremities   Protein-calorie malnutrition, severe   CAP (community acquired pneumonia)   Aspiration pneumonia ([place])   Plan: #Aspiration Pneumonia: #COPD Patient presented with aspiration pneumonia with worsening of her underlying tenuous respiratory function.  She was started on ampicillin-sulbactam and is on day 3 of antibiotic therapy.  She had 2 out of 4 blood culture was positive for gram-positive rods/bacillus species thought to be a contaminant.  She is responding well to therapy and is back to her baseline oxygen requirement of 3 L [place]. She continues to have a productive cough with expiratory wheezing and coarse breath sounds, but is afebrile with a normal WBC count.  Considering patient's history of end-stage COPD and her tenuous respiratory status, I believe she would benefit from a palliative care consult to further discuss goals of care moving forward -Continue ampicillin sulbactam IV for 6 days. -New supplemental oxygen as needed to maintain an SpO2 in the low 90's - Continue home COPD medications (Yupelri, Brovana, Pulmicort) - Continue Duonebs q4 hours PRN - Consider adding systemic corticosteroids - Aspiration precaution - Incentive spirometry/flutter valve  - Palliative care consult for goals of  care  #Atrial Fibrillation with RVR: Patient presented with atrial fibrillation with rapid ventricular response.  Patient has intermittent hypotension that limits AV nodal blocking medications.  During the hospitalization the patient has had multiple cardioversions without success.  She has since been started on IV amiodarone with conversion of her heart rhythm to sinus rhythm.  Her rates are much better controlled at this time.  Etiology has been consulted and has been managing her IV amiodarone.  I appreciate their assistance with this patient.  Patient has a CHA2DS2-VASc of 3 and has subsequently  been started on Xarelto. I am hopeful that the patient's arrhythmia will improve with treating her aspiration pneumonia and subsequent improvement of her respiratory status.  I agree that patient would likely not be able to tolerate long-term amiodarone therapy due to her underlying pulmonary disease. - Continue IV amiodarone, transition to oral amiodarone per cardiology -Continue Xarelto 15 mg daily with supper -Appreciate cardiology's assistance comanaging this patient -Continue to trend electrolytes with magnesium goal greater than 2 and potassium goal greater than 4.  #Hypotension: Patient has had intermittent hypotension during this hospitalization.  Patient does have a history of steroid use off and on.  Furthermore she has history of chronic hyponatremia concerning for adrenal insufficiency.  We will perform an ACTH stim test tomorrow morning. - AM cortisol - ACTH Stim test tomorrow morning  #Hyponatremia: Patient has chronic hyponatremia.  VS hyponatremia studies have shown hypotonic hyponatremia with serum osmolality of 265 with a urine sodium elevated to 47 and a urine osmolality of 234.  Her last TSH from 11/2020 was within normal limits.  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  Therefore, we will  perform an ACTH stim test tomorrow and repeat hyponatremia labs. -Osmolality, urine sodium, urine osmolality - ACTH Stim test  #Chronic venous insufficiency #Bilateral lower extremity venous stasis ulcers: Patient has chronic lower extremity ulcers.  Patient's lower extremity ulcers are currently wrapped.  Wound care was consulted.  We will continue dressing changes per their recommendations. -Continue daily wound care/dressing changes per consult.   Best Practice: Diet: Regular diet IVF: none VTE: SCDs Start: 05/27/21 1518Rivaroxaban (XARELTO) tablet 15 mg  Code: Full PT/OT: Pending Family Contact: to be notified. AB: Unasyn day 3 DISPO: Rehab   Anticipated discharge in 1-3 days pending clinical improvement and disposition.  Lawerance Cruel, D.O.  Internal Medicine Resident, PGY-2 [facility]  Pager: [phone] 6:15 PM, 05/29/2021   Please contact the on call pager after 5 pm and on weekends at [phone].   Internal Medicine Attending:   I saw and examined the patient. I reviewed the resident's note and I agree with the resident's findings and plan as documented in the resident's note.  Lalla Brothers, MD

## 2021-05-29 NOTE — Evaluation (Signed)
Physical Therapy Evaluation Patient Details Name: Kristen [name] MRN: [id number] DOB: 08/18/1946 Today's Date: 05/29/2021   History of Present Illness  75 yo admitted 7/5 with SOB with AFib with RVR. Attempted cardioversion x 3 unsuccessful. PMHx: COPD on home O2, chronic Afib, cellulitis, hyponatremia,Chronic venous stasis dermatitis  Clinical Impression  Pt supine on arrival with frequent coughing. Pt with HR 104 at rest with rise to 150 with sitting EOB. Limited gait in room with HR 135 and at rest HR 115. Pt with decreased strength, transfers, gait and function who will benefit from acute therapy to maximize mobility, safety and function to decrease burden of care.   SpO2 96% on 3L 101/64 (75)    Follow Up Recommendations Home health PT    Equipment Recommendations  None recommended by PT    Recommendations for Other Services       Precautions / Restrictions Precautions Precautions: Fall;Other (comment) Precaution Comments: watch HR      Mobility  Bed Mobility Overal bed mobility: Needs Assistance Bed Mobility: Supine to Sit     Supine to sit: Min guard;HOB elevated     General bed mobility comments: HOb 20 degrees with increased time and assist for line management. HR increase from 104-150 with transition to EOB. After 2 min seated HR 125    Transfers Overall transfer level: Needs assistance   Transfers: Sit to/from Stand Sit to Stand: Min guard         General transfer comment: guarding with good hand placement from surface. HR 117 with transition to standing  Ambulation/Gait Ambulation/Gait assistance: Min guard Gait Distance (Feet): 15 Feet Assistive device: Rolling walker (2 wheeled) Gait Pattern/deviations: Step-through pattern;Decreased stride length   Gait velocity interpretation: 1.31 - 2.62 ft/sec, indicative of limited community ambulator General Gait Details: cues for posture and safety with gait limited by HR 139 and fatigue  Stairs             Wheelchair Mobility    Modified Rankin (Stroke Patients Only)       Balance Overall balance assessment: Needs assistance Sitting-balance support: No upper extremity supported;Feet supported Sitting balance-Leahy Scale: Fair     Standing balance support: Bilateral upper extremity supported Standing balance-Leahy Scale: Poor Standing balance comment: bil UE support on RW with standing                             Pertinent Vitals/Pain Pain Assessment: No/denies pain    Home Living Family/patient expects to be discharged to:: Private residence Living Arrangements: Other relatives Available Help at Discharge: Family;Available 24 hours/day Type of Home: House Home Access: Level entry     Home Layout: One level Home Equipment: Cane - single point;Shower seat;Wheelchair - [employer] - 2 wheels;Walker - 4 wheels      Prior Function Level of Independence: Independent         Comments: 3L at home     Hand Dominance        Extremity/Trunk Assessment   Upper Extremity Assessment Upper Extremity Assessment: Generalized weakness    Lower Extremity Assessment Lower Extremity Assessment: Generalized weakness (quad strength 4/5, hip flexion 3/5)    Cervical / Trunk Assessment Cervical / Trunk Assessment: Kyphotic  Communication   Communication: No difficulties  Cognition Arousal/Alertness: Awake/alert Behavior During Therapy: WFL for tasks assessed/performed Overall Cognitive Status: Within Functional Limits for tasks assessed  General Comments      Exercises General Exercises - Lower Extremity Long Arc Quad: AROM;Both;Seated;10 reps Hip Flexion/Marching: AROM;Both;Seated;10 reps   Assessment/Plan    PT Assessment Patient needs continued PT services  PT Problem List Decreased mobility;Decreased strength;Decreased activity tolerance;Decreased balance;Decreased knowledge of use of  DME;Cardiopulmonary status limiting activity       PT Treatment Interventions Gait training;Balance training;Functional mobility training;Therapeutic activities;Patient/family education;DME instruction;Therapeutic exercise    PT Goals (Current goals can be found in the Care Plan section)  Acute Rehab PT Goals Patient Stated Goal: return home to read and talk to my sister PT Goal Formulation: With patient Time For Goal Achievement: 06/12/21 Potential to Achieve Goals: Fair    Frequency Min 3X/week   Barriers to discharge        Co-evaluation               AM-PAC PT "6 Clicks" Mobility  Outcome Measure Help needed turning from your back to your side while in a flat bed without using bedrails?: A Little Help needed moving from lying on your back to sitting on the side of a flat bed without using bedrails?: A Little Help needed moving to and from a bed to a chair (including a wheelchair)?: A Little Help needed standing up from a chair using your arms (e.g., wheelchair or bedside chair)?: A Little Help needed to walk in hospital room?: A Little Help needed climbing 3-5 steps with a railing? : A Lot 6 Click Score: 17    End of Session Equipment Utilized During Treatment: Gait belt;Oxygen Activity Tolerance: Patient tolerated treatment well Patient left: in chair;with call bell/phone within reach;with chair alarm set Nurse Communication: Mobility status PT Visit Diagnosis: Difficulty in walking, not elsewhere classified (R26.2);Other abnormalities of gait and mobility (R26.89);Muscle weakness (generalized) (M62.81)    Time: 7948-0165 PT Time Calculation (min) (ACUTE ONLY): 26 min   Charges:   PT Evaluation $PT Eval Moderate Complexity: 1 Mod PT Treatments $Therapeutic Activity: 8-22 mins        Katrenia Alkins P, PT Acute Rehabilitation Services Pager: [phone] Office: [place] Ramie Palladino 05/29/2021, 9:14 AM

## 2021-05-29 NOTE — Progress Notes (Signed)
Progress Note  Patient Name: Keelie Zemanek Date of Encounter: 05/29/2021  [facility] Cardiologist: None   Subjective   Doing better this morning. Sitting up in a chair. Continues to have cough.  Developed recurrent Afib with RVR yesterday; re-bolused amiodarone 132m x2 doses  WBC improved from 21>9 Off levo  Inpatient Medications    Scheduled Meds:  (feeding supplement) PROSource Plus  30 mL Oral BID BM   amiodarone  200 mg Oral BID   arformoterol  15 mcg Nebulization BID   budesonide (PULMICORT) nebulizer solution  0.5 mg Nebulization BID   Chlorhexidine Gluconate Cloth  6 each Topical Daily   famotidine  20 mg Oral Daily   feeding supplement  1 Container Oral BID BM   multivitamin with minerals  1 tablet Oral Daily   revefenacin  175 mcg Nebulization Daily   Rivaroxaban  15 mg Oral Q supper   Continuous Infusions:  sodium chloride 10 mL/hr at 05/29/21 0600   ampicillin-sulbactam (UNASYN) IV Stopped (05/29/21 0008)   lactated ringers     magnesium sulfate bolus IVPB 4 g (05/29/21 0643)   norepinephrine (LEVOPHED) Adult infusion Stopped (05/28/21 0801)   promethazine (PHENERGAN) injection (IM or IVPB)     PRN Meds: sodium chloride, acetaminophen, docusate sodium, ipratropium-albuterol, oxyCODONE-acetaminophen, polyethylene glycol, promethazine (PHENERGAN) injection (IM or IVPB) **OR** promethazine   Vital Signs    Vitals:   05/29/21 0300 05/29/21 0400 05/29/21 0500 05/29/21 0600  BP: 111/78 1_0  Pulse: (!) 112 (!) 108 (!) 120 93  Resp: (!) 24 (!) 25 (!) 22 (!) 24  Temp:      TempSrc:      SpO2: 98% 100% 100% 100%  Weight:      Height:        Intake/Output Summary (Last 24 hours) at 05/29/2021 0648 Last data filed at 05/29/2021 0600 Gross per 24 hour  Intake 1295.27 ml  Output 100 ml  Net 1195.27 ml    Last 3 Weights 05/28/2021 05/27/2021 05/27/2021  Weight (lbs) 91 lb 14.9 oz 91 lb 11.4 oz 90 lb  Weight (kg) 41.7 kg 41.6 kg 40.824 kg       Telemetry    Afib with RVR - Personally Reviewed  ECG    No new tracing 05/29/21 - Personally Reviewed  Physical Exam   GEN: Cachectic, comfortable, NAD  Neck: No JVD Cardiac: Irregularly irregular no murmurs Respiratory: Very diminished throughout, Coughing throughout exam GI: Thin, soft, nontender, non-distended  MS: LE wounds wrapped. No edema Neuro:  Nonfocal  Psych: Normal affect   Labs    High Sensitivity Troponin:   Recent Labs  Lab 05/27/21 1220 05/27/21 1519  TROPONINIHS 18* 51*       Chemistry Recent Labs  Lab 05/27/21 1220 05/28/21 0437 05/29/21 0328  NA 128* 129* 131*  K 4.4 3.8 3.5  CL 89* 95* 96*  CO2 _1 GLUCOSE 130* 104* 111*  BUN _2 CREATININE 0.81 0.58 0.73  CALCIUM 8.3* 7.9* 8.0*  PROT 5.4*  --   --   ALBUMIN 1.9*  --   --   AST 17  --   --   ALT 8  --   --   ALKPHOS 124  --   --   BILITOT 1.1  --   --   GFRNONAA >60 >60 >60  ANIONGAP _3 Hematology Recent Labs  Lab 05/27/21 1220 05/28/21 0437 05/29/21 06503  WBC 24.2* 21.9* 9.2  RBC 3.65* 3.33* 3.28*  HGB 10.7* 9.7* 9.4*  HCT 34.8* 31.0* 30.1*  MCV 95.3 93.1 91.8  MCH 29.3 29.1 28.7  MCHC 30.7 31.3 31.2  RDW 13.1 13.4 13.3  PLT 544* 509* 502*     BNP Recent Labs  Lab 05/27/21 1220  BNP 526.6*      DDimer No results for input(s): DDIMER in the last 168 hours.   Radiology    DG CHEST PORT 1 VIEW  Result Date: 05/28/2021 CLINICAL DATA:  Shortness of breath.  COPD.  Atrial fibrillation. EXAM: PORTABLE CHEST 1 VIEW COMPARISON:  05/27/2021. FINDINGS: Cardiomegaly with bilateral interstitial prominence, right side greater than left. Findings suggest interstitial edema. Pneumonitis can not be excluded. Small bilateral pleural effusions. COPD. No pneumothorax. IMPRESSION: 1. Cardiomegaly. Bilateral interstitial prominence, right side greater than left. Findings suggest interstitial edema. Pneumonitis cannot be excluded. Similar findings noted on  prior exam. Small bilateral pleural effusions. 2.  COPD. Electronically Signed   By: Marcello Moores  Register   On: 05/28/2021 07:49   DG Chest Portable 1 View  Result Date: 05/27/2021 CLINICAL DATA:  Shortness of breath. History of AFib. History of COPD. EXAM: PORTABLE CHEST 1 VIEW COMPARISON:  Chest x-ray 03/07/2021. FINDINGS: Cardiomegaly. Bilateral interstitial prominence, right side greater than left. Findings suggest interstitial edema. Pneumonitis cannot be excluded. Small right pleural effusion. COPD. No pneumothorax. IMPRESSION: 1. Cardiomegaly. Bilateral interstitial prominence, right side greater than left. Findings suggest interstitial edema. Pneumonitis can not be excluded. Small right pleural effusion. 2.  COPD. Electronically Signed   By: Marcello Moores  Register   On: 05/27/2021 12:59    Cardiac Studies   Echo 10/23/2020 1. Left ventricular ejection fraction, by estimation, is 55 to 60%. The  left ventricle has normal function. The left ventricle has no regional  wall motion abnormalities. Left ventricular diastolic parameters are  consistent with Grade I diastolic  dysfunction (impaired relaxation). There is the interventricular septum is  flattened in diastole ('D' shaped left ventricle), consistent with right  ventricular volume overload.   2. Right ventricular systolic function is low normal. The right  ventricular size is normal.   3. The mitral valve is grossly normal. No evidence of mitral valve  regurgitation.   4. The aortic valve is tricuspid. There is mild calcification of the  aortic valve. There is mild thickening of the aortic valve. Aortic valve  regurgitation is not visualized.   5. The inferior vena cava is normal in size with greater than 50%  respiratory variability, suggesting right atrial pressure of 3 mmHg.    Patient Profile     75 y.o. female with history of paroxysmal atrial fibrillation diagnosed in January 2022 on Xarelto, tobacco abuse, COPD on home O2,  bilateral ankle wound, chronic venous stasis dermatitis, and hyponatremia who presented with worsening shortness of breath and hypotension found to have RLL pneumonia and infected bilateral ankle wounds with concern for septic shock as well as Afib with RVR. Cardiology is now consulted for Afib with RVR.  Assessment & Plan    #Atrial fibrillation with RVR: CHADs-vasc 4. Has known diagnosis of Afib that was initially diagnosed during a hospitalization in 11/2020 at which point she was started on metop and xarelto. During this admission, patient presented with Afib with RVR in the setting of septic shock. Attempted DCCV x3 in the ER without success and therefore placed on amiodarone gtt. Home xarelto change to heparin gtt for [facility]. Initially converted back to NSR on  05/28/21 but then developed recurrent Afib with RVR; rebolused with amiodarone. -Will transition back to amiodarone bolus +gtt -Continue heparin gtt for now; can resume xarelto per critical care team -Unable to tolerate metop due to hypotension -Management of septic shock per primary team  #Septic Shock: #RLL Pneumonia: #Infected Bilateral LE Wounds: Patient presented with worsening SOB and hypotension found to have RLL pneumonia and infected bilateral LE wounds concerning for septic shock. Now improved and weaned off pressors. -Management per critical care team -Off pressors -Continue broad spectrum ABX  #Troponin Elevation Suspect demand in the setting of Afib with RVR and septic shock with low suspicion for ACS. -Manage Afib and septic shock as above  #Acute COPD Exacerbation: #Chronic Tobacco Use: -Management per critical care team    For questions or updates, please contact [facility] Please consult [email or web address] for contact info under        Signed, Freada Bergeron, MD  05/29/2021, 6:48 AM

## 2021-05-29 NOTE — Progress Notes (Signed)
K 3.5, Mg 1.5 Electrolytes replaced per [facility] electrolyte replacement protocol

## 2021-05-29 NOTE — Hospital Course (Addendum)
Pt improved D/c today  Buspar 17m Mirtazapine 135m hasn't gotten here Stopped metoprolol d/t hypotension in hospital  Amiodarone Xarelto   Kristen [name] a [age]ear old [facility], [facility] fibrillation with RVR, Acute COPD exacerbation, and mixed cardiogenic and septic shock. She was initially managed in the ICU and was then transferred to general medicine floor team.   Aspiration Pneumonia Initially presented with increased oxygen requirement from baseline, leukocytosis of 24.2, procalcitonin, peak procalcitonin of 1.73, lactic acid 2.5. CXR x2 showed bilateral interstitial prominence concerning for aspiration pneumonitis/pneumonia. Completed 5 days of IV Ampicillin-sulfbactam therapy then transitioned to 5 day course of oral Augmentin. Blood culutres showed no growth. By day of discharge leukocytosis, lactic acid levels and procalcitonin was improved and patient was back on home oxygen requirement of 3L [place].   Atrial Fibrillation with Rapid Ventricular Rate; Hx of Paroxysmal A-fib Presented with Atrial Fibrillation with heart rates elevated in the 13887-195Vith systolic blood pressures 8074-71Eoncerning for cardiogenic shock. Was cardioverted three times in the ED with no success. Cardiology was consulted and amiodarone drip was continued which resulted in eventual conversion to NSR with regular heart rates. Anticoagulation with Xarelto was continued. She was transitioned to PO amiodarone on day of discharge.   COPD Exacerbation; Chronic COPD Presented with increased oxygen requirement of 5L on non-rebreather and lung hyperinflation with diaphragmatic flattening bilaterally on CXR in the setting of increasing cough and sputum production.  Likely triggered by concurrent aspiration pneumonia. Was managed by home COPD medications (YMaretta BeesBrovana, and Pulmicort) and PRN Duonebulizer treatment. Oxygen requirement had returned to baseline of 3L [place] and cough  was relatively improved by the time of discharge. She was also seen by Palliative Care team during this admission during which she expressed a desire to change her code status to DNR and that she prefers quality over quantity of life. Palliative recommended discharge with home health PT and Palliative Care.  Hypotension Presented with SBPs 80s-90s that persisted despite fluid resuscitation concerning for cardiogenic vs septic shock. Pressures improved on Levophed drip with the exception intermittent drops in blood pressures likely attributable to poor PO intake in the setting of acute illness. Adrenal Insufficiency workup in the setting of concurrent chronic hyponatremia was unremarkable. Home Metoprolol was held during this admission. Pressures normotensive and stable on day of discharge.   Chronic Hyponatremia Patient has chronic hyponatremia, baseline 128-130. Hyponatremia workup consistent with total body sodium depletion. Adrenal insufficiency workup unremarkable. No mental status changes during hospitalization. Sodium correction was not indicated during this admission.   Bilateral Lower Extremity Ulcers; Hx of Chronic Venous Insufficiency Presented with bilateral chronic wounds on bilateral lower extremities. Wound Care team was consulted and daily dressing changes were completed as per their recommendations.     7/14 not doing well this morning, has been having some nausea and threw up a little bit. Did have the nausea medication at about 630. She thinks that the medication helped, but she likes the IV form of the medication better. Her breathing is doing well after her breathing treatment today. She did spend about 30 minutes on the bathroom, says that she did that instead of sitting in the chair.  - patient tested positive prior to discharge to SNF. No prior history of Covid, had not received any booster  7/15: requesting natural tears. Patient has been drinking a lot of water - patient  opting to stay in hospital until discharge to SNF because her family  is going away on vacation - BP low, 100/63 on recheck

## 2021-05-29 NOTE — Progress Notes (Signed)
[facility] Progress Note Patient Name: Kristen [name] DOB: 19-Sep-1946 MRN: [id number]   Date of Service  05/29/2021  HPI/Events of Note  Patient with nausea, Qtc 0.52  eICU Interventions  Phenergan 12.5 mg iv x 1 ordered.        Kerry Kass Danelia Snodgrass 05/29/2021, 2:20 AM

## 2021-05-30 DIAGNOSIS — J690 Pneumonitis due to inhalation of food and vomit: Secondary | ICD-10-CM | POA: Diagnosis not present

## 2021-05-30 DIAGNOSIS — I4891 Unspecified atrial fibrillation: Secondary | ICD-10-CM | POA: Diagnosis not present

## 2021-05-30 LAB — COMPREHENSIVE METABOLIC PANEL
ALT: 9 U/L (ref 0–44)
AST: 17 U/L (ref 15–41)
Albumin: 1.6 g/dL — ABNORMAL LOW (ref 3.5–5.0)
Alkaline Phosphatase: 72 U/L (ref 38–126)
Anion gap: 5 (ref 5–15)
BUN: 9 mg/dL (ref 8–23)
CO2: 30 mmol/L (ref 22–32)
Calcium: 8.1 mg/dL — ABNORMAL LOW (ref 8.9–10.3)
Chloride: 94 mmol/L — ABNORMAL LOW (ref 98–111)
Creatinine, Ser: 0.68 mg/dL (ref 0.44–1.00)
GFR, Estimated: >60 mL/min (ref >60)
Glucose, Bld: 114 mg/dL — ABNORMAL HIGH (ref 70–99)
Potassium: 4 mmol/L (ref 3.5–5.1)
Sodium: 129 mmol/L — ABNORMAL LOW (ref 135–145)
Total Bilirubin: 0.3 mg/dL (ref 0.3–1.2)
Total Protein: 4.7 g/dL — ABNORMAL LOW (ref 6.5–8.1)

## 2021-05-30 LAB — CBC
HCT: 30.2 % — ABNORMAL LOW (ref 36.0–46.0)
Hemoglobin: 9.6 g/dL — ABNORMAL LOW (ref 12.0–15.0)
MCH: 28.9 pg (ref 26.0–34.0)
MCHC: 31.8 g/dL (ref 30.0–36.0)
MCV: 91 fL (ref 80.0–100.0)
Platelets: 446 10*3/uL — ABNORMAL HIGH (ref 150–400)
RBC: 3.32 MIL/uL — ABNORMAL LOW (ref 3.87–5.11)
RDW: 13.5 % (ref 11.5–15.5)
WBC: 5.9 10*3/uL (ref 4.0–10.5)
nRBC: 0 % (ref 0.0–0.2)

## 2021-05-30 LAB — OSMOLALITY: Osmolality: 271 mOsm/kg — ABNORMAL LOW (ref 275–295)

## 2021-05-30 LAB — MAGNESIUM: Magnesium: 1.4 mg/dL — ABNORMAL LOW (ref 1.7–2.4)

## 2021-05-30 LAB — CORTISOL-AM, BLOOD: Cortisol - AM: 14.5 ug/dL (ref 6.7–22.6)

## 2021-05-30 LAB — ACTH STIMULATION, 3 TIME POINTS
Cortisol, 30 Min: 25.3 ug/dL
Cortisol, 60 Min: 31 ug/dL
Cortisol, Base: 14.6 ug/dL

## 2021-05-30 LAB — PHOSPHORUS: Phosphorus: 3.5 mg/dL (ref 2.5–4.6)

## 2021-05-30 MED ORDER — MAGNESIUM SULFATE 4 GM/100ML IV SOLN
4.0000 g | Freq: Once | INTRAVENOUS | Status: AC
  Administered 2021-05-30: 4 g via INTRAVENOUS
  Filled 2021-05-30: qty 100

## 2021-05-30 MED ORDER — MORPHINE SULFATE (PF) 2 MG/ML IV SOLN: 2.0000 mg | Freq: Four times a day (QID) | INTRAVENOUS | Status: DC | PRN

## 2021-05-30 MED ORDER — MORPHINE SULFATE (PF) 2 MG/ML IV SOLN
1.0000 mg | Freq: Four times a day (QID) | INTRAVENOUS | Status: DC | PRN
Start: 2021-05-30 — End: 2021-06-18
  Filled 2021-05-30: qty 1

## 2021-05-30 MED ORDER — MORPHINE SULFATE (PF) 2 MG/ML IV SOLN: 2.0000 mg | INTRAVENOUS | Status: DC | PRN

## 2021-05-30 NOTE — Progress Notes (Signed)
Pharmacy Antibiotic Note  Kristen [name] is a 75 y.o. female admitted on 05/27/2021 with  presented with SOB in afib w/RVR in ED, concern for aspiration pneumonia as well as lower leg wound .  Pharmacy has been consulted for Unasyn dosing.  Day#4 of abx - WBC 5.9, afeb, Scr stable at 0.7   Plan: Unasyn 3g IV every 8 hours Monitor renal function, clinical progression and LOT F/u LOT - could consider change to augmentin for completion of course  Height: _0  (177.8 cm) Weight: 40.8 kg (90 lb) IBW/kg (Calculated) : 68.5  Temp (24hrs), Avg:97.9 F (36.6 C), Min:97.1 F (36.2 C), Max:98.6 F (37 C)  Recent Labs  Lab 05/27/21 1220 05/27/21 1224 05/27/21 1519 05/28/21 0437 05/29/21 0328 05/30/21 0736  WBC 24.2*  --   --  21.9* 9.2 5.9  CREATININE 0.81  --   --  0.58 0.73 0.68  LATICACIDVEN  --  2.6* 1.7  --   --   --     Estimated Creatinine Clearance: 38.5 mL/min (by C-G formula based on SCr of 0.68 mg/dL).    Allergies  Allergen Reactions   Gabapentin Other (See Comments)    Hallucination/ nightmares    Antimicrobials this admission: Unasyn 7/5 >>    Microbiology results: 7/5 BCx: 2/4 GPR, likely contaminant 7/5 MRSA PCR: not detected   Thank you for allowing pharmacy to be a part of this patient's care.  Vance Peper, PharmD PGY1 Resident Phone: [phone] 05/30/2021 10:31 AM

## 2021-05-30 NOTE — Progress Notes (Addendum)
HD#3 SUBJECTIVE:   Patient Summary: Kristen [name] is a 75 y.o. with a pertinent PMH of paroxysmal atrial fibrillation on anticoagulation, COPD on 3L home oxygen, venous insufficiency ulcers, and generalized anxiety being treated for Afib with RVR, aspiration pneumonia and COPD exacerbation.   Overnight, there was concern for urinary retention, bladder scan not documented in chart.  This morning, she was seen at bedside. She shares that she feels well and her breathing is similar to how it is at home. She denies feeling dizzy in the hospital but does state that at home if she stands up too quickly, she feels nauseous. She denies chest pain, fevers and night sweats. She understands that the palliative care team will come to see her and discuss goals of care. She wants her breathing treatment now.  OBJECTIVE:   Vital Signs: Vitals:   05/30/21 0115 05/30/21 0420 05/30/21 0652 05/30/21 0848  BP: 102/64 122/84 (!) 159/74   Pulse: 66 66  67  Resp: _0 Temp: 98.6 F (37 C) (!) 97.1 F (36.2 C)    TempSrc: Oral Axillary    SpO2: 95% 93%  93%  Weight:  40.8 kg    Height:       Supplemental O2: Nasal Cannula SpO2: 93 % O2 Flow Rate (L/min): 3 L/min  Filed Weights   05/27/21 1707 05/28/21 0600 05/30/21 0420  Weight: 41.6 kg 41.7 kg 40.8 kg     Intake/Output Summary (Last 24 hours) at 05/30/2021 1046 Last data filed at 05/30/2021 0645 Gross per 24 hour  Intake 1149.42 ml  Output 600 ml  Net 549.42 ml   Net IO Since Admission: 3,500.13 mL [05/30/21 1046]  Physical Exam: Constitutional: cachectic-appearing elderly female sitting up in bed, in no acute distress. Cardiovascular: normal sinus rhythm, regular rate Pulmonary/Chest: normal work of breathing on 3L nasal canula MSK: poor bulk and tone diffusely Neurological: alert & oriented x 3, answers questions appropriately Psych: affect mood congruent   Patient Lines/Drains/Airways Status     Active Line/Drains/Airways      Name Placement date Placement time Site Days   Peripheral IV 05/27/21 20 G Left Antecubital 05/27/21  1225  Antecubital  2   Peripheral IV 05/27/21 20 G Right Antecubital 05/27/21  1310  Antecubital  2   Peripheral IV 05/27/21 20 G 1" Anterior;Left;Proximal;Upper Arm 05/27/21  1940  Arm  2   External Urinary Catheter 05/27/21  1707  --  2   Wound / Incision (Open or Dehisced) 02/24/21 Venous stasis ulcer Ankle Anterior;Right 02/24/21  1011  Ankle  94   Wound / Incision (Open or Dehisced) 05/27/21 Venous stasis ulcer Ankle Left;Medial 05/27/21  1707  Ankle  2            Labs: CBC Latest Ref Rng & Units 05/30/2021 05/29/2021 05/28/2021  WBC 4.0 - 10.5 K/uL 5.9 9.2 21.9(H)  Hemoglobin 12.0 - 15.0 g/dL 9.6(L) 9.4(L) 9.7(L)  Hematocrit 36.0 - 46.0 % 30.2(L) 30.1(L) 31.0(L)  Platelets 150 - 400 K/uL 446(H) 502(H) 509(H)    CMP Latest Ref Rng & Units 05/30/2021 05/29/2021 05/28/2021  Glucose 70 - 99 mg/dL 114(H) 111(H) 104(H)  BUN 8 - 23 mg/dL _1 Creatinine 0.44 - 1.00 mg/dL 0.68 0.73 0.58  Sodium 135 - 145 mmol/L 129(L) 131(L) 129(L)  Potassium 3.5 - 5.1 mmol/L 4.0 3.5 3.8  Chloride 98 - 111 mmol/L 94(L) 96(L) 95(L)  CO2 22 - 32 mmol/L _2 Calcium  8.9 - 10.3 mg/dL 8.1(L) 8.0(L) 7.9(L)  Total Protein 6.5 - 8.1 g/dL 4.7(L) - -  Total Bilirubin 0.3 - 1.2 mg/dL 0.3 - -  Alkaline Phos 38 - 126 U/L 72 - -  AST 15 - 41 U/L 17 - -  ALT 0 - 44 U/L 9 - -     ASSESSMENT/PLAN:   Assessment: Principal Problem:   Aspiration pneumonia (HCC) Active Problems:   Venous insufficiency of both lower extremities   Atrial fibrillation with RVR (HCC)   Protein-calorie malnutrition, severe   CAP (community acquired pneumonia)   Plan: Aspiration Pneumonia (Improving): Today is day 4/6 of antibiotic therapy. She continues to saturate appropriately on her baseline home oxygen requirement of 3L [place]. Hypothermic today at 97.1 degrees F, however white count continues to downtrend, 5.9 today. Bcx  x2 growing gram-positive rods/bacillus species, likely contaminant. Is amenable to palliative care to discuss comfort and goals of care. Will continue antibiotic therapy and encourage ambulation as tolerable.  - Continue IV Ampicillin-Sulbactam; day 4/6 - Aspiration precautions  COPD Exacerbation Patient has a history of Chronic COPD, with hyperinflation and flattened diaphragms on CXR. Appears cachectic on exam with BMI 12-13. Oxygen requirement was initially increased to 5L [place] on admission, but has since then improved to baseline of 3L [place]. Endorsing congestion and cough this morning.  - Continue home COPD medications (Yupelri, Brovana, Pulmicort) - Continue Duonebs q4 hours PRN - Incentive spirometry/flutter valve   Atrial Fibrillation with RVR: Patient presented with atrial fibrillation with rapid ventricular response rates 130s-140s.Likely secondary to cumulative effects of structural lung disease and acute infection. Unsuccessful cardioversion x3 in the ED. Cardiology was consulted and patient was started on amio infusion. Converted to NSR on 7/5 but went back into A-fib with RVR yesterday requiring reinitiating of amio infusion. Today, she is in NSR with heart rates 60-80. Given improvement at this time, will transition to oral amiodarone tomorrow as per cardiology recommendations.  - Continue Amiodarone infusion - Transition to oral amiodarone tomorrow 200 mg BID x7 days and then 200 mg once daily x7 days - Avoid rate controlling medications given intermittent hypotension -Continue Xarelto 15 mg daily  -Cardiology following; appreciate reccs. - Morning BMP and Mg; goal K+>4 and goal Mg> 2; replete PRN  Hypotension: Patient continues to have asymptomatic intermittent systolic Bps in the 02D in the setting of  chronic hyponatremia.  ACTH stimulation test today ruled out adrenal insufficiency. Likely related to recent infection and deconditioning/malnutrition.  Hyponatremia: Patient has  chronic hyponatremia. TSH normal, Chest CT in 4/22 did not show signs of malignancy. Hyponatremia labs in 4/22 during last admission showed Urine Osm 234, serum Osm 265, and urine sodium of 47 consistent with a decreased solute intake. Adrenal insufficiency ruled out. Serum Na today is 129 and there are no acute changes in mental status. It is likely that given the chronic nature of the hyponatremia, baseline Na for her is around 128-130 - Free water restriction to 1527m daily  Bilateral lower extremity venous stasis ulcers 2/2 Chronic venous insufficiency Patient has open ulcer wounds on bilateral lower extremities, Wound care team was consulted yesterday and they recommend daily dressing changes.  Best Practice: Diet: Regular diet IVF: none VTE: Rivaroxaban (XARELTO) tablet 15 mg  Code: Full PT/OT: Pending Family Contact: to be notified. AB: Unasyn day 4 DISPO: Pending   Anticipated discharge in 1-3 days pending clinical improvement and disposition.  AOk Edwards Medical Student ([id number]) Pager: [phone]

## 2021-05-30 NOTE — Progress Notes (Signed)
Lab notified to draw pt's Cortisol level. Talked with Dewitt Hoes and told phlebotomy will be on unit by 6 am.

## 2021-05-30 NOTE — Progress Notes (Addendum)
11:35AM OT reattempted, pt reports still feeling poorly. Will follow-up at a later time/date.  OT Cancellation Note  Patient Details Name: Kristen [name] MRN: [id number] DOB: [birth year]   Cancelled Treatment:    Reason Eval/Treat Not Completed: Other (comment). Pt politely requests OT come back in a bit, reports stomach upset from earlier medication. OT to reattempt in a bit.  Tyrone Schimke, OT Acute Rehabilitation Services Pager: [phone] Office: [phone]  05/30/2021, 9:09 AM

## 2021-05-30 NOTE — Progress Notes (Signed)
Progress Note  Patient Name: Kristen [name] Date of Encounter: 05/30/2021  [place] Cardiologist: None   Subjective   Transferred to [place]. Feeling better. Continues to have significant cough. Mild pain in LE.  Converted back to NSR with HR 60s Na low 129, Mg low 1.4  Inpatient Medications    Scheduled Meds:  (feeding supplement) PROSource Plus  30 mL Oral BID BM   arformoterol  15 mcg Nebulization BID   budesonide (PULMICORT) nebulizer solution  0.5 mg Nebulization BID   famotidine  20 mg Oral Daily   feeding supplement  1 Container Oral BID BM   multivitamin with minerals  1 tablet Oral Daily   revefenacin  175 mcg Nebulization Daily   Rivaroxaban  15 mg Oral Q supper   Continuous Infusions:  sodium chloride Stopped (05/29/21 1016)   amiodarone 30 mg/hr (05/29/21 2343)   ampicillin-sulbactam (UNASYN) IV 3 g (05/30/21 1105)   lactated ringers     norepinephrine (LEVOPHED) Adult infusion Stopped (05/28/21 0801)   promethazine (PHENERGAN) injection (IM or IVPB)     PRN Meds: sodium chloride, acetaminophen, docusate sodium, ipratropium-albuterol, oxyCODONE-acetaminophen, polyethylene glycol, promethazine (PHENERGAN) injection (IM or IVPB) **OR** promethazine   Vital Signs    Vitals:   05/30/21 0115 05/30/21 0420 05/30/21 0652 05/30/21 0848  BP: 102/64 122/84 (!) 159/74   Pulse: 66 66  67  Resp: _0 Temp: 98.6 F (37 C) (!) 97.1 F (36.2 C)    TempSrc: Oral Axillary    SpO2: 95% 93%  93%  Weight:  40.8 kg    Height:        Intake/Output Summary (Last 24 hours) at 05/30/2021 1133 Last data filed at 05/30/2021 0645 Gross per 24 hour  Intake 1059.33 ml  Output 600 ml  Net 459.33 ml    Last 3 Weights 05/30/2021 05/28/2021 05/27/2021  Weight (lbs) 90 lb 91 lb 14.9 oz 91 lb 11.4 oz  Weight (kg) 40.824 kg 41.7 kg 41.6 kg      Telemetry    NSR with HR 60s - Personally Reviewed  ECG    No new tracing 05/30/21 - Personally Reviewed  Physical Exam   GEN:  Cachectic, comfortable, NAD  Neck: No JVD Cardiac: Irregularly irregular no murmurs Respiratory: Very diminished throughout, Coughing throughout exam GI: Thin, soft, nontender, non-distended  MS: LE wounds wrapped. No edema Neuro:  Nonfocal  Psych: Normal affect   Labs    High Sensitivity Troponin:   Recent Labs  Lab 05/27/21 1220 05/27/21 1519  TROPONINIHS 18* 51*       Chemistry Recent Labs  Lab 05/27/21 1220 05/28/21 0437 05/29/21 0328 05/30/21 0736  NA 128* 129* 131* 129*  K 4.4 3.8 3.5 4.0  CL 89* 95* 96* 94*  CO2 _1 GLUCOSE 130* 104* 111* 114*  BUN _2 CREATININE 0.81 0.58 0.73 0.68  CALCIUM 8.3* 7.9* 8.0* 8.1*  PROT 5.4*  --   --  4.7*  ALBUMIN 1.9*  --   --  1.6*  AST 17  --   --  17  ALT 8  --   --  9  ALKPHOS 124  --   --  72  BILITOT 1.1  --   --  0.3  GFRNONAA >60 >60 >60 >60  ANIONGAP _3 Hematology Recent Labs  Lab 05/28/21 0437 05/29/21 0328 05/30/21 0736  WBC 21.9* 9.2 5.9  RBC 3.33* 3.28* 3.32*  HGB 9.7* 9.4* 9.6*  HCT 31.0* 30.1* 30.2*  MCV 93.1 91.8 91.0  MCH 29.1 28.7 28.9  MCHC 31.3 31.2 31.8  RDW 13.4 13.3 13.5  PLT 509* 502* 446*     BNP Recent Labs  Lab 05/27/21 1220  BNP 526.6*      DDimer No results for input(s): DDIMER in the last 168 hours.   Radiology    No results found.  Cardiac Studies   Echo 10/23/2020 1. Left ventricular ejection fraction, by estimation, is 55 to 60%. The  left ventricle has normal function. The left ventricle has no regional  wall motion abnormalities. Left ventricular diastolic parameters are  consistent with Grade I diastolic  dysfunction (impaired relaxation). There is the interventricular septum is  flattened in diastole ('D' shaped left ventricle), consistent with right  ventricular volume overload.   2. Right ventricular systolic function is low normal. The right  ventricular size is normal.   3. The mitral valve is grossly normal. No evidence  of mitral valve  regurgitation.   4. The aortic valve is tricuspid. There is mild calcification of the  aortic valve. There is mild thickening of the aortic valve. Aortic valve  regurgitation is not visualized.   5. The inferior vena cava is normal in size with greater than 50%  respiratory variability, suggesting right atrial pressure of 3 mmHg.    Patient Profile     75 y.o. female with history of paroxysmal atrial fibrillation diagnosed in January 2022 on Xarelto, tobacco abuse, COPD on home O2, bilateral ankle wound, chronic venous stasis dermatitis, and hyponatremia who presented with worsening shortness of breath and hypotension found to have RLL pneumonia and infected bilateral ankle wounds with concern for septic shock as well as Afib with RVR. Cardiology is now consulted for Afib with RVR.  Assessment & Plan    #Atrial fibrillation with RVR: CHADs-vasc 4. Has known diagnosis of Afib that was initially diagnosed during a hospitalization in 11/2020 at which point she was started on metop and xarelto. During this admission, patient presented with Afib with RVR in the setting of septic shock. Attempted DCCV x3 in the ER without success and therefore placed on amiodarone gtt. Home xarelto change to heparin gtt for [facility]. Initially converted back to NSR on 05/28/21 but then developed recurrent Afib with RVR; rebolused with amiodarone. Now back in NSR. -Continue amiodarone gtt today -Plan to transition to amiodarone PO tomorrow if remains in rhythm (likely 474m BID x 7days, then 2021mBID for 7days and then 20040maily thereafter) -Changed back to xarelto 38m82mily -Unable to tolerate metop due to hypotension; will add as able -Management of septic shock per primary team  #Septic Shock: #RLL Pneumonia: #Infected Bilateral LE Wounds: Patient presented with worsening SOB and hypotension found to have RLL pneumonia and infected bilateral LE wounds concerning for septic shock. Now improved and  weaned off pressors. -Management per primary team -Continue broad spectrum ABX  #Troponin Elevation Suspect demand in the setting of Afib with RVR and septic shock with low suspicion for ACS. -Manage Afib and septic shock as above  #Acute COPD Exacerbation: #Chronic Tobacco Use: -Management per critical care team    For questions or updates, please contact [facility] Please consult [email or web address] for contact info under        Signed, HeatFreada Bergeron  05/30/2021, 11:33 AM

## 2021-05-31 DIAGNOSIS — Z66 Do not resuscitate: Secondary | ICD-10-CM | POA: Diagnosis not present

## 2021-05-31 DIAGNOSIS — Z515 Encounter for palliative care: Secondary | ICD-10-CM

## 2021-05-31 DIAGNOSIS — I4891 Unspecified atrial fibrillation: Secondary | ICD-10-CM | POA: Diagnosis not present

## 2021-05-31 DIAGNOSIS — J690 Pneumonitis due to inhalation of food and vomit: Secondary | ICD-10-CM | POA: Diagnosis not present

## 2021-05-31 LAB — BASIC METABOLIC PANEL
Anion gap: 5 (ref 5–15)
BUN: 8 mg/dL (ref 8–23)
CO2: 31 mmol/L (ref 22–32)
Calcium: 7.6 mg/dL — ABNORMAL LOW (ref 8.9–10.3)
Chloride: 93 mmol/L — ABNORMAL LOW (ref 98–111)
Creatinine, Ser: 0.6 mg/dL (ref 0.44–1.00)
GFR, Estimated: >60 mL/min (ref >60)
Glucose, Bld: 94 mg/dL (ref 70–99)
Potassium: 3.8 mmol/L (ref 3.5–5.1)
Sodium: 129 mmol/L — ABNORMAL LOW (ref 135–145)

## 2021-05-31 LAB — CBC
HCT: 28.3 % — ABNORMAL LOW (ref 36.0–46.0)
Hemoglobin: 9.1 g/dL — ABNORMAL LOW (ref 12.0–15.0)
MCH: 28.8 pg (ref 26.0–34.0)
MCHC: 32.2 g/dL (ref 30.0–36.0)
MCV: 89.6 fL (ref 80.0–100.0)
Platelets: 454 10*3/uL — ABNORMAL HIGH (ref 150–400)
RBC: 3.16 MIL/uL — ABNORMAL LOW (ref 3.87–5.11)
RDW: 13.4 % (ref 11.5–15.5)
WBC: 8 10*3/uL (ref 4.0–10.5)
nRBC: 0 % (ref 0.0–0.2)

## 2021-05-31 LAB — MAGNESIUM: Magnesium: 1.9 mg/dL (ref 1.7–2.4)

## 2021-05-31 MED ORDER — AMOXICILLIN-POT CLAVULANATE 875-125 MG PO TABS
1.0000 | ORAL_TABLET | Freq: Two times a day (BID) | ORAL | Status: AC
  Administered 2021-06-01 (×2): 1 via ORAL
  Filled 2021-05-31 (×2): qty 1

## 2021-05-31 MED ORDER — ONDANSETRON HCL 4 MG PO TABS
4.0000 mg | ORAL_TABLET | Freq: Once | ORAL | Status: AC
  Administered 2021-05-31: 4 mg via ORAL
  Filled 2021-05-31: qty 1

## 2021-05-31 MED ORDER — AMIODARONE HCL 200 MG PO TABS
400.0000 mg | ORAL_TABLET | Freq: Two times a day (BID) | ORAL | Status: DC
  Administered 2021-05-31 – 2021-06-01 (×3): 400 mg via ORAL
  Filled 2021-05-31 (×3): qty 2

## 2021-05-31 MED ORDER — MAGNESIUM SULFATE 2 GM/50ML IV SOLN
2.0000 g | Freq: Once | INTRAVENOUS | Status: AC
  Administered 2021-05-31: 2 g via INTRAVENOUS
  Filled 2021-05-31: qty 50

## 2021-05-31 MED ORDER — POTASSIUM CHLORIDE CRYS ER 20 MEQ PO TBCR
40.0000 meq | EXTENDED_RELEASE_TABLET | Freq: Two times a day (BID) | ORAL | Status: AC
  Administered 2021-05-31 (×2): 40 meq via ORAL
  Filled 2021-05-31 (×2): qty 2

## 2021-05-31 NOTE — Progress Notes (Signed)
OT Cancellation Note  Patient Details Name: Kristen [name] MRN: [id number] DOB: Sep 15, 1946   Cancelled Treatment:    Reason Eval/Treat Not Completed: Medical issues which prohibited therapy. Pt with nausea and has requested pain meds.  [place], OTR/L Acute Rehab Services Pager [phone] Office [phone]    Almon Register 05/31/2021, 10:42 AM

## 2021-05-31 NOTE — Progress Notes (Signed)
Progress Note  Patient Name: Kristen [name] Date of Encounter: 05/31/2021  [facility] Cardiologist: Johney Frame   Subjective   75 year old female with known chronic aspiration.  She developed a fibrillation with rapid ventricular response.  She is converted with IV amiodarone.  We will be converting to p.o. amiodarone today.   Inpatient Medications    Scheduled Meds:  (feeding supplement) PROSource Plus  30 mL Oral BID BM   amiodarone  400 mg Oral BID   arformoterol  15 mcg Nebulization BID   budesonide (PULMICORT) nebulizer solution  0.5 mg Nebulization BID   famotidine  20 mg Oral Daily   feeding supplement  1 Container Oral BID BM   multivitamin with minerals  1 tablet Oral Daily   potassium chloride  40 mEq Oral BID   revefenacin  175 mcg Nebulization Daily   Rivaroxaban  15 mg Oral Q supper   Continuous Infusions:  sodium chloride Stopped (05/29/21 1016)   ampicillin-sulbactam (UNASYN) IV 3 g (05/31/21 0950)   lactated ringers     PRN Meds: sodium chloride, acetaminophen, docusate sodium, ipratropium-albuterol, morphine injection, oxyCODONE-acetaminophen, polyethylene glycol   Vital Signs    Vitals:   05/30/21 2116 05/31/21 0646 05/31/21 0653 05/31/21 0740  BP: 113/62 137/77    Pulse: 66 65  65  Resp: (!) 24 19    Temp: 98 F (36.7 C) 98 F (36.7 C)    TempSrc: Oral Oral    SpO2: 97% 93%    Weight:   42.2 kg   Height:        Intake/Output Summary (Last 24 hours) at 05/31/2021 1208 Last data filed at 05/31/2021 0737 Gross per 24 hour  Intake 857 ml  Output 900 ml  Net -43 ml   Last 3 Weights 05/31/2021 05/30/2021 05/28/2021  Weight (lbs) 93 lb 90 lb 91 lb 14.9 oz  Weight (kg) 42.185 kg 40.824 kg 41.7 kg      Telemetry    NSR - Personally Reviewed  ECG     - Personally Reviewed  Physical Exam   GEN: No acute distress.   Neck: No JVD Cardiac: RRR, no murmurs, rubs, or gallops.  Respiratory: fine fibrotic rales  GI: Soft, nontender, non-distended   MS: No edema; No deformity. Neuro:  Nonfocal  Psych: Normal affect   Labs    High Sensitivity Troponin:   Recent Labs  Lab 05/27/21 1220 05/27/21 1519  TROPONINIHS 18* 51*      Chemistry Recent Labs  Lab 05/27/21 1220 05/28/21 0437 05/29/21 0328 05/30/21 0736 05/31/21 0120  NA 128*   < > 131* 129* 129*  K 4.4   < > 3.5 4.0 3.8  CL 89*   < > 96* 94* 93*  CO2 27   < > _0 GLUCOSE 130*   < > 111* 114* 94  BUN 12   < > _1 CREATININE 0.81   < > 0.73 0.68 0.60  CALCIUM 8.3*   < > 8.0* 8.1* 7.6*  PROT 5.4*  --   --  4.7*  --   ALBUMIN 1.9*  --   --  1.6*  --   AST 17  --   --  17  --   ALT 8  --   --  9  --   ALKPHOS 124  --   --  72  --   BILITOT 1.1  --   --  0.3  --   GFRNONAA >60   < > >  60 >60 >60  ANIONGAP 12   < > _0 < > = values in this interval not displayed.     Hematology Recent Labs  Lab 05/29/21 0328 05/30/21 0736 05/31/21 0120  WBC 9.2 5.9 8.0  RBC 3.28* 3.32* 3.16*  HGB 9.4* 9.6* 9.1*  HCT 30.1* 30.2* 28.3*  MCV 91.8 91.0 89.6  MCH 28.7 28.9 28.8  MCHC 31.2 31.8 32.2  RDW 13.3 13.5 13.4  PLT 502* 446* 454*    BNP Recent Labs  Lab 05/27/21 1220  BNP 526.6*     DDimer No results for input(s): DDIMER in the last 168 hours.   Radiology    No results found.  Cardiac Studies      Patient Profile     75 y.o. female    [place]    1.  Atrial fibrillation with rapid ventricular response. Her CHA2DS2-VASc score is 4.  Continue   Xarelto.  She did not tolerate metoprolol due to hypotension.  She has been on IV amiodarone and is now in sinus rhythm.  The plan is to change to oral amiodarone today.  We will treat her with 40 mg twice a day for 7 days, 200 mg twice a day for 7 days, then 200 mg a day thereafter.  She is now on Xarelto 15 mg a day.  2. COPD :  plans per IM       For questions or updates, please contact [facility] Please consult [email or web address] for contact info under         Signed, Mertie Moores, MD  05/31/2021, 12:09 PM

## 2021-05-31 NOTE — Consult Note (Signed)
Consultation Note Date: 05/31/2021   Patient Name: Kristen [name]  DOB: January 13, 1946  MRN: [id number]  Age / Sex: 75 y.o., female  PCP: [name], MD Referring Physician: Axel Filler, *  Reason for Consultation: Establishing goals of care  HPI/Patient Profile: 75 y.o. female  with past medical history of COPD on 3-4L of home oxygen, Cellulitis, chronic venous stasis with bilateral LE ulcers, chronic hyponatremia, pulmonary cachexia, and atrial fibrillation who was admitted on 05/27/2021 with difficulty breathing.  She was found to be in afib with RVR, have infected ankle wound, and to have aspiration pneumonia.  DCCV was attempted 3 times in the ED but was not successful.   Currently, she is feeling much better after treatment.  She remains on an amiodarone drip to control her rapid heart rate.  She has had some difficulty in working with physical therapy in that her heart rate goes up into the 150s with minimal exertion.  Palliative medicine was consulted for goals of care.  Clinical Assessment and Goals of Care: I have reviewed medical records including EPIC notes, labs and imaging, received report from RN, assessed the patient and then met at the bedside along with her to discuss diagnosis prognosis, GOC, EOL wishes, disposition and options.  After speaking with the patient I called her sister and designated decision making surrogate Kristen [name] and talked with her about the patient's condition and our conversation.  I introduced Palliative Medicine as specialized medical care for people living with serious illness. It focuses on providing relief from the symptoms and stress of a serious illness. The goal is to improve quality of life for both the patient and the family.  We discussed a brief life review of the patient and then focused on their current illness. The natural disease trajectory and  expectations at EOL were discussed.  Kristen [name] has lived quite an interesting life.  She is the single mother of 2 adult daughters.  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  She currently lives with her sister Kristen [name].  Kristen [name] is 3 daughters and 1 son all live very nearby and are quite supportive of their mother and aunt.  Woodie tells me that she loves reading, being outside, the water, as she speaks it is very evident that she loves her family with all of her heart.  She also describes great joy from visiting with old girlfriends.  She seems to maintain a good quality of life  I attempted to elicit values and goals of care important to the patient.  Alizza was quick to "I am not here for a long time, I am here for a good time "she says she picked that up when she attended bar in [place] from one of her customers and describes her feelings.  She tells me that she cannot party like she did when she was 69, but she does not miss a birthday celebration!  The  difference between aggressive medical intervention and comfort care was considered in light of the patient's goals of care.  Advanced directives, concepts specific to code status, artifical feeding and hydration, and rehospitalization were considered and discussed.  If Kristen [name] should suffer cardiac arrest she does not wish to be resuscitated.  Consequently I talked with her about changing her CODE STATUS to DNR.  If she is unable to represent herself she wishes for her medical decisions to be made by her sister Kristen [name] as she and Kristen live together.  Discussed the importance of continued conversation with family and the medical providers regarding overall plan of care and treatment options, ensuring decisions are within the context of the patient's values and GOCs.    I then called Kristen [name] on the phone and talked with her about the  patient's A. fib with RVR and her aspiration pneumonia.  We talked about the changing CODE STATUS and designating her as surrogate decision-maker.  We will attempt to have a living will and HC POA completed on Monday.  Questions and concerns were addressed.  The family was encouraged to call with questions or concerns.  PMT will continue to support holistically.    Primary Decision Maker:  PATIENT    SUMMARY OF RECOMMENDATIONS    Kristen [name] is a good candidate to be followed by palliative care outpatient  Code Status/Advance Care Planning: CODE STATUS changed to DNR. Patient elects her Sister Kristen [name] as her surrogate decision-maker I will place the chaplain consult to have an advanced directive/HC POA completed on Monday if possible   Symptom Management:  Per primary team  Additional Recommendations (Limitations, Scope, Preferences): Full Scope Treatment.  Patient does not wish to be able to return to the hospital and receive full treatment outside of the ICU should she become ill.  Palliative Prophylaxis:  Frequent Pain Assessment  Psycho-social/Spiritual:  Desire for further Chaplaincy support: Not discussed  Prognosis:  Unable to determine, however Kristen [name] is at high risk for acute decompensation due to atrial fibrillation with RVR.  She is at high risk for rehospitalization.  Discharge Planning: Home with Home Health and palliative care      Primary Diagnoses: Present on Admission:  Atrial fibrillation with RVR (HCC)  Protein-calorie malnutrition, severe  CAP (community acquired pneumonia)  Venous insufficiency of both lower extremities   I have reviewed the medical record, interviewed the patient and family, and examined the patient. The following aspects are pertinent.  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)    Social History   Socioeconomic History   Marital status: Divorced    Spouse name: Not on file   Number of children: Not on file   Years of education: Not on file   Highest education level: Not on file  Occupational History    Comment: RETIRED  Tobacco Use   Smoking status: Former    Packs/day: 0.25    Pack years: 0.00    Types: Cigarettes    Quit date: 10/16/2019    Years since quitting: 1.6   Smokeless tobacco: Never   Tobacco comments:    2-3 per day   Vaping Use   Vaping Use: Never used  Substance and   Sexual Activity   Alcohol use: Yes    Comment: occasionally   Drug use: Never   Sexual activity: Not on file  Other Topics Concern   Not on file  Social History Narrative   Not on file   Social Determinants of Health   Financial Resource Strain: Not on file  Food Insecurity: Not on file  Transportation Needs: Not on file  Physical Activity: Not on file  Stress: Not on file  Social Connections: Not on file   Family History  Problem Relation Age of Onset   Diabetes Mother    Diabetes Mellitus II Mother    Diabetes Father    Diverticulitis Sister     Allergies  Allergen Reactions   Gabapentin Other (See Comments)    Hallucination/ nightmares      Vital Signs: BP 137/77 (BP Location: Right Arm)   Pulse 65   Temp 98 F (36.7 C) (Oral)   Resp 19   Ht 5' 10" (1.778 m)   Wt 42.2 kg   SpO2 93%   BMI 13.34 kg/m  Pain Scale: 0-10 POSS *See Group Information*: 1-Acceptable,Awake and alert Pain Score: 0-No pain   SpO2: SpO2: 93 % O2 Device:SpO2: 93 % O2 Flow Rate: .O2 Flow Rate (L/min): 3 L/min    Palliative Assessment/Data: 50%     Time In: 1030 Time Out: 1140 Time Total: 70 minutes Visit consisted of counseling and education dealing with the complex and emotionally intense issues surrounding the need for palliative care and symptom  management in the setting of serious and potentially life-threatening illness. Greater than 50%  of this time was spent counseling and coordinating care related to the above assessment and plan.  Signed by: Marianne Dellinger, PA-C Palliative Medicine  Please contact Palliative Medicine Team phone at [phone] for questions and concerns.  For individual provider: See Amion               

## 2021-05-31 NOTE — Progress Notes (Addendum)
HD#4 SUBJECTIVE:  O/N Events: No acute events overnight  Feels improved today and her sleep seems to be improving. Tired this morning when they brought her breakfast around. Denies any chest pain, does not seem to have had any recent coughing fits. Last bad coughing fit was earlier this morning. Has not seen the heart doctors yet this morning. Thinks that things are going smoothly this morning.   OBJECTIVE:   Vital Signs: Vitals:   05/30/21 0848 05/30/21 1143 05/30/21 2036 05/30/21 2116  BP:  (!) 110/58  113/62  Pulse: 67 67  66  Resp: 16 18  (!) 24  Temp:  98.3 F (36.8 C)  98 F (36.7 C)  TempSrc:  Oral  Oral  SpO2: 93%  97% 97%  Weight:      Height:       Supplemental O2: Nasal Cannula SpO2: 97 % O2 Flow Rate (L/min): 3 L/min  Filed Weights   05/27/21 1707 05/28/21 0600 05/30/21 0420  Weight: 41.6 kg 41.7 kg 40.8 kg     Intake/Output Summary (Last 24 hours) at 05/31/2021 0641 Last data filed at 05/30/2021 1300 Gross per 24 hour  Intake 720 ml  Output 250 ml  Net 470 ml   Net IO Since Admission: 3,860.13 mL [05/31/21 0641]  Physical Exam Constitutional:      Appearance: She is ill-appearing. She is not toxic-appearing or diaphoretic.     Comments: Cachectic in appearance, answers questions appropriately.  Pulmonary:     Effort: No respiratory distress.     Breath sounds: No stridor. Wheezing present. No rhonchi or rales.     Comments: Faint wheezes bilaterally, cough improved. Chest:     Chest wall: No tenderness.  Musculoskeletal:     Right lower leg: No edema.     Left lower leg: No edema.  Neurological:     Mental Status: She is alert.  Psychiatric:        Mood and Affect: Mood normal.        Behavior: Behavior normal.    Patient Lines/Drains/Airways Status     Active Line/Drains/Airways     Name Placement date Placement time Site Days   Peripheral IV 05/27/21 20 G Left Antecubital 05/27/21  1225  Antecubital  2   Peripheral IV 05/27/21 20 G  Right Antecubital 05/27/21  1310  Antecubital  2   Peripheral IV 05/27/21 20 G 1" Anterior;Left;Proximal;Upper Arm 05/27/21  1940  Arm  2   External Urinary Catheter 05/27/21  1707  --  2   Wound / Incision (Open or Dehisced) 02/24/21 Venous stasis ulcer Ankle Anterior;Right 02/24/21  1011  Ankle  94   Wound / Incision (Open or Dehisced) 05/27/21 Venous stasis ulcer Ankle Left;Medial 05/27/21  1707  Ankle  2            Labs: CBC Latest Ref Rng & Units 05/31/2021 05/30/2021 05/29/2021  WBC 4.0 - 10.5 K/uL 8.0 5.9 9.2  Hemoglobin 12.0 - 15.0 g/dL 9.1(L) 9.6(L) 9.4(L)  Hematocrit 36.0 - 46.0 % 28.3(L) 30.2(L) 30.1(L)  Platelets 150 - 400 K/uL 454(H) 446(H) 502(H)    CMP Latest Ref Rng & Units 05/31/2021 05/30/2021 05/29/2021  Glucose 70 - 99 mg/dL 94 114(H) 111(H)  BUN 8 - 23 mg/dL _0 Creatinine 0.44 - 1.00 mg/dL 0.60 0.68 0.73  Sodium 135 - 145 mmol/L 129(L) 129(L) 131(L)  Potassium 3.5 - 5.1 mmol/L 3.8 4.0 3.5  Chloride 98 - 111 mmol/L 93(L) 94(L) 96(L)  CO2 22 - 32 mmol/L _0 Calcium 8.9 - 10.3 mg/dL 7.6(L) 8.1(L) 8.0(L)  Total Protein 6.5 - 8.1 g/dL - 4.7(L) -  Total Bilirubin 0.3 - 1.2 mg/dL - 0.3 -  Alkaline Phos 38 - 126 U/L - 72 -  AST 15 - 41 U/L - 17 -  ALT 0 - 44 U/L - 9 -     ASSESSMENT/PLAN:   Assessment: Principal Problem:   Aspiration pneumonia (HCC) Active Problems:   Venous insufficiency of both lower extremities   Atrial fibrillation with RVR (HCC)   Protein-calorie malnutrition, severe   CAP (community acquired pneumonia)   Plan: Aspiration Pneumonia (Improving): Today is day 5/6 of antibiotic therapy.  Saturates well on home 3L [place], with faint wheezes heard bilaterally. Reports much improved with reduced coughing fits. - Continue IV Ampicillin-Sulbactam; day 5/6 if clinically stable will transition to p.o. medications on 06/01/2021 - Aspiration precautions  COPD Exacerbation Breathing comfortably on on home 3 L, but still has some wheezing on  examination.  - Continue home COPD medications (Yupelri, Brovana, Pulmicort) - Continue Duonebs q4 hours PRN - Incentive spirometry/flutter valve   Atrial Fibrillation with RVR: Appears to have converted at bedside this morning to normal sinus rhythm.  We will transition off of amiodarone infusion to oral medications, per cardiology.  She does not tolerate rate control medications due to hypotensive episodes. -Oral amiodarone 400 mg twice daily for 7 days, 200 mg BID x7 days and then 200 mg once daily -Continue Xarelto 15 mg daily  -Cardiology following; appreciate reccs. - Morning BMP and Mg; goal K+>4 and goal Mg> 2; replete PRN  - Repletion for magnesium 1.9 and potassium 3.8.  Hypotension: Patient continues to have asymptomatic intermittent systolic Bps in the 36I in the setting of  chronic hyponatremia.  ACTH stimulation test today ruled out adrenal insufficiency. Likely related to recent infection and deconditioning/malnutrition.  Hyponatremia: Sodium continues to be at baseline 129 today. - Free water restriction to 1570m daily  Bilateral lower extremity venous stasis ulcers 2/2 Chronic venous insufficiency Continue daily dressing changes.  Best Practice: Diet: Regular diet IVF: none VTE: Rivaroxaban (XARELTO) tablet 15 mg  Code: Full PT/OT: Pending Family Contact: to be notified. DISPO: Pending  SMaudie Mercury MD IMTS, PGY-3 Pager: [phone]/07/2021,1:47 PM

## 2021-06-01 ENCOUNTER — Other Ambulatory Visit: Payer: Self-pay

## 2021-06-01 DIAGNOSIS — I4891 Unspecified atrial fibrillation: Secondary | ICD-10-CM | POA: Diagnosis not present

## 2021-06-01 DIAGNOSIS — J690 Pneumonitis due to inhalation of food and vomit: Secondary | ICD-10-CM | POA: Diagnosis not present

## 2021-06-01 LAB — BASIC METABOLIC PANEL
Anion gap: 5 (ref 5–15)
BUN: 11 mg/dL (ref 8–23)
CO2: 31 mmol/L (ref 22–32)
Calcium: 7.8 mg/dL — ABNORMAL LOW (ref 8.9–10.3)
Chloride: 93 mmol/L — ABNORMAL LOW (ref 98–111)
Creatinine, Ser: 0.8 mg/dL (ref 0.44–1.00)
GFR, Estimated: >60 mL/min (ref >60)
Glucose, Bld: 91 mg/dL (ref 70–99)
Potassium: 5.4 mmol/L — ABNORMAL HIGH (ref 3.5–5.1)
Sodium: 129 mmol/L — ABNORMAL LOW (ref 135–145)

## 2021-06-01 LAB — CBC
HCT: 28 % — ABNORMAL LOW (ref 36.0–46.0)
Hemoglobin: 9.1 g/dL — ABNORMAL LOW (ref 12.0–15.0)
MCH: 29.5 pg (ref 26.0–34.0)
MCHC: 32.5 g/dL (ref 30.0–36.0)
MCV: 90.9 fL (ref 80.0–100.0)
Platelets: 426 10*3/uL — ABNORMAL HIGH (ref 150–400)
RBC: 3.08 MIL/uL — ABNORMAL LOW (ref 3.87–5.11)
RDW: 13.7 % (ref 11.5–15.5)
WBC: 7.7 10*3/uL (ref 4.0–10.5)
nRBC: 0 % (ref 0.0–0.2)

## 2021-06-01 LAB — CULTURE, BLOOD (ROUTINE X 2)
Culture: NO GROWTH
Special Requests: ADEQUATE

## 2021-06-01 LAB — MAGNESIUM
Magnesium: 1.4 mg/dL — ABNORMAL LOW (ref 1.7–2.4)
Magnesium: 2.6 mg/dL — ABNORMAL HIGH (ref 1.7–2.4)

## 2021-06-01 LAB — POTASSIUM: Potassium: 5 mmol/L (ref 3.5–5.1)

## 2021-06-01 MED ORDER — MAGNESIUM SULFATE 4 GM/100ML IV SOLN
4.0000 g | Freq: Once | INTRAVENOUS | Status: AC
  Administered 2021-06-01: 4 g via INTRAVENOUS
  Filled 2021-06-01: qty 100

## 2021-06-01 MED ORDER — AMIODARONE HCL 200 MG PO TABS
200.0000 mg | ORAL_TABLET | Freq: Every day | ORAL | Status: DC
  Administered 2021-06-02 – 2021-06-16 (×15): 200 mg via ORAL
  Filled 2021-06-01 (×16): qty 1

## 2021-06-01 MED ORDER — ONDANSETRON HCL 4 MG PO TABS
4.0000 mg | ORAL_TABLET | Freq: Four times a day (QID) | ORAL | Status: DC | PRN
  Administered 2021-06-02 – 2021-06-08 (×16): 4 mg via ORAL
  Filled 2021-06-01 (×16): qty 1

## 2021-06-01 MED ORDER — ONDANSETRON HCL 4 MG/2ML IJ SOLN
4.0000 mg | Freq: Once | INTRAMUSCULAR | Status: AC
  Administered 2021-06-01: 4 mg via INTRAVENOUS
  Filled 2021-06-01: qty 2

## 2021-06-01 NOTE — Progress Notes (Addendum)
HD#5 SUBJECTIVE:  O/N Events: No acute events overnight  Kristen [name] was seen at bedside this morning. She states that she had a headache earlier in the morning which has since then resolved. She continues to endorse nausea. She denies chest pain and dizziness. She is looking forward to eating her breakfast and getting cleaned up for the day. She would like to participate in PT/OT activities today.   OBJECTIVE:   Vital Signs: Vitals:   05/31/21 1951 06/01/21 0451 06/01/21 0726 06/01/21 1109  BP: 123/75 127/69    Pulse: 75 63 (!) 57 61  Resp: 19 16    Temp: 97.9 F (36.6 C) 97.9 F (36.6 C)    TempSrc: Oral Oral    SpO2: 100% 96%    Weight:  49 kg    Height:       Supplemental O2: Nasal Cannula SpO2: 96 % O2 Flow Rate (L/min): 3 L/min  Filed Weights   05/30/21 0420 05/31/21 0653 06/01/21 0451  Weight: 40.8 kg 42.2 kg 49 kg     Intake/Output Summary (Last 24 hours) at 06/01/2021 1543 Last data filed at 06/01/2021 0500 Gross per 24 hour  Intake --  Output 1200 ml  Net -1200 ml   Net IO Since Admission: 3,184.98 mL [06/01/21 1543]  BP 127/69 (BP Location: Right Arm)   Pulse (!) 57   Temp 97.9 F (36.6 C) (Oral)   Resp 16   Ht _0  (1.778 m)   Wt 49 kg   SpO2 96%   BMI 15.50 kg/m  Constitutional: Fragile-appearing, cachetic female sitting up in bed, in no acute distress Cardiovascular: regular rate and rhythm, no murmurs/rubs/gallops Pulmonary/Chest: normal work of breathing on room air, upper and lower lung fields CTAB Abdominal: soft, non-tender, non-distended MSK: poor bulk and tone Neurological: alert & oriented x 3, answers questions appropriately  Skin: warm and dry Psych: affect mood congruent    Patient Lines/Drains/Airways Status     Active Line/Drains/Airways     Name Placement date Placement time Site Days   Peripheral IV 05/27/21 20 G Left Antecubital 05/27/21  1225  Antecubital  2   Peripheral IV 05/27/21 20 G Right Antecubital 05/27/21   1310  Antecubital  2   Peripheral IV 05/27/21 20 G 1" Anterior;Left;Proximal;Upper Arm 05/27/21  1940  Arm  2   External Urinary Catheter 05/27/21  1707  --  2   Wound / Incision (Open or Dehisced) 02/24/21 Venous stasis ulcer Ankle Anterior;Right 02/24/21  1011  Ankle  94   Wound / Incision (Open or Dehisced) 05/27/21 Venous stasis ulcer Ankle Left;Medial 05/27/21  1707  Ankle  2            Labs: CBC Latest Ref Rng & Units 06/01/2021 05/31/2021 05/30/2021  WBC 4.0 - 10.5 K/uL 7.7 8.0 5.9  Hemoglobin 12.0 - 15.0 g/dL 9.1(L) 9.1(L) 9.6(L)  Hematocrit 36.0 - 46.0 % 28.0(L) 28.3(L) 30.2(L)  Platelets 150 - 400 K/uL 426(H) 454(H) 446(H)    CMP Latest Ref Rng & Units 06/01/2021 06/01/2021 05/31/2021  Glucose 70 - 99 mg/dL - 91 94  BUN 8 - 23 mg/dL - 11 8  Creatinine 0.44 - 1.00 mg/dL - 0.80 0.60  Sodium 135 - 145 mmol/L - 129(L) 129(L)  Potassium 3.5 - 5.1 mmol/L 5.0 5.4(H) 3.8  Chloride 98 - 111 mmol/L - 93(L) 93(L)  CO2 22 - 32 mmol/L - 31 31  Calcium 8.9 - 10.3 mg/dL - 7.8(L) 7.6(L)  Total Protein 6.5 -  8.1 g/dL - - -  Total Bilirubin 0.3 - 1.2 mg/dL - - -  Alkaline Phos 38 - 126 U/L - - -  AST 15 - 41 U/L - - -  ALT 0 - 44 U/L - - -     ASSESSMENT/PLAN:   Assessment: Principal Problem:   Aspiration pneumonia (HCC) Active Problems:   Venous insufficiency of both lower extremities   Atrial fibrillation with RVR (HCC)   Protein-calorie malnutrition, severe   CAP (community acquired pneumonia)   Plan: Aspiration Pneumonia (Improving): Today is day 6/6 of antibiotic therapy. Remains afebrile. Saturating appropriately on baseline 3L [place]. WBC remains wnl. Feels that her cough is improved. - IV Ampicillin-Sulfbactam day 6/6 - Aspiration precautions  COPD Exacerbation Saturating appropriately on home oxygen 3L nasal [place]. Intermittently has productive cough, improved from admission.  - Continue home COPD medications (Yupelri, Brovana, Pulmicort) - Continue Duonebs q4 hours  PRN - Incentive spirometry/flutter valve   Atrial Fibrillation with RVR: Remains in NSR. Transitioned to 400 mg BID PO amiodarone yesterday. Amiodarone may be cause of patient's nausea. Cardiology recommends decreasing amiodarone dose to 200 mg daily, and has signed off. Of note, K+ this morning elevated to 5.4 however she denies chest pain and there are no peaked T waves on EKG. Repeat potassium decreased to 5. Additionally, Mg was 1.4, so will replete. - Oral Amiodarone 200 mg daily -Continue Xarelto 15 mg daily  -Cardiology following; appreciate reccs. - Morning BMP and Mg; goal K+>4 and goal Mg> 2; replete PRN  - Repletion for magnesium 1.9 and potassium 3.8. - follow up repeat Mg  Hypotension: Patient remained normotensive over the last 24 hours. Likely etiology was poor po intake in the setting of acute illness.  - Routine vitals - meal encouragement  Hyponatremia: Sodium remains at baseline 129 today. - Free water restriction to 1579m daily  Bilateral lower extremity venous stasis ulcers 2/2 Chronic venous insufficiency Continue daily dressing changes.  Best Practice: Diet: Regular diet IVF: none VTE: Rivaroxaban (XARELTO) tablet 15 mg  Code: Full PT/OT: Pending Family Contact: to be notified. DISPO: Home PT/Palliative with sister  AOk Edwards MMassachusettsPager: [phone]/08/2021,3:43 PM   Attestation for Student Documentation:  I personally was present and performed or re-performed the history, physical exam and medical decision-making activities of this service and have verified that the service and findings are accurately documented in the student's note.  GDelene Ruffini MD 06/01/2021, 6:36 PM

## 2021-06-01 NOTE — Discharge Instructions (Addendum)
   Discharge Instructions:  [name], Kristen [name] were admitted to the hospital for treatment of  Aspiration pneumonia COPD Exacerbation Atrial Fibrillation with Rapid Ventricular Rates  Your aspiration pneumonia improved with 5 days of IV antibiotic treatment and you completed your antibiotics during your hospital stay.   Your atrial fibrillation improved with amiodarone infusion. On discharge, please continue taking amiodarone 200 mg daily and follow up with cardiology.   For your COPD exacerbation and chronic COPD illness, you received nebulizer treatments and continued your home COPD medications. The palliative care team visited you to explore your goals moving forward and you indicated that you would like to change your code status to DNR.  Please inform your primary care doctor about this discussion so that they can update their documentation.   Please continue taking Amiodarone 287m and Xarelto 158m  Please continue taking the salt tablets each day.                             [facility] They will call you to inform you of day and time.   In the meantime please call 911 or go to your nearest emergency department if you begin to have chest pain, fevers, shortness of breath different from what is normal to you, dizziness, sudden confusion, weakness in arms or legs, or speech difficulties.

## 2021-06-01 NOTE — Discharge Summary (Addendum)
Name: Kristen [name] MRN: [id number] DOB: 1946/06/26 75 y.o. PCP: [name], MD  Date of Admission: 05/27/2021 12:12 PM Date of Discharge: 06/17/2021 Attending Physician: Lucious Groves, DO  Discharge Diagnosis: COVID 19 infection Chronic hyponatremia Atrial fibrillation Bilateral lower extremity venous stasis ulcers  Discharge Medications: Allergies as of 06/17/2021       Reactions   Gabapentin Other (See Comments)   Hallucination/ nightmares        Medication List     STOP taking these medications    metoprolol succinate 25 MG 24 hr tablet Commonly known as: TOPROL-XL   predniSONE 20 MG tablet Commonly known as: DELTASONE       TAKE these medications    albuterol 108 (90 Base) MCG/ACT inhaler Commonly known as: VENTOLIN HFA INHALE 1-2 PUFFS BY MOUTH EVERY 6 HOURS AS NEEDED FOR WHEEZE OR SHORTNESS OF BREATH What changed: See the new instructions.   amiodarone 200 MG tablet Commonly known as: PACERONE Take 1 tablet (200 mg total) by mouth daily.   busPIRone 10 MG tablet Commonly known as: BUSPAR Take 10 mg by mouth daily.   docusate sodium 100 MG capsule Commonly known as: COLACE Take 1 capsule (100 mg total) by mouth 2 (two) times daily as needed for mild constipation.   Dulera 200-5 MCG/ACT Aero Generic drug: mometasone-formoterol Inhale 1 puff into the lungs in the morning and at bedtime.   fluticasone 50 MCG/ACT nasal spray Commonly known as: FLONASE Place 1 spray into both nostrils daily as needed for allergies or rhinitis.   Incruse Ellipta 62.5 MCG/INH Aepb Generic drug: umeclidinium bromide INHALE 1 PUFF BY MOUTH EVERY DAY What changed: See the new instructions.   ipratropium-albuterol 0.5-2.5 (3) MG/3ML Soln Commonly known as: DUONEB TAKE 3 MLS BY NEBULIZATION EVERY 6 (SIX) HOURS AS NEEDED (SHORTNESS OF BREATH/WHEEZING.).   mirtazapine 15 MG tablet Commonly known as: REMERON Take 1 tablet (15 mg total) by mouth at bedtime as  needed (sleep).   multivitamin with minerals Tabs tablet Take 1 tablet by mouth daily.   ondansetron 4 MG tablet Commonly known as: ZOFRAN Take 1 tablet (4 mg total) by mouth daily as needed for nausea or vomiting. What changed: Another medication with the same name was added. Make sure you understand how and when to take each.   ondansetron 4 MG tablet Commonly known as: ZOFRAN Take 1 tablet (4 mg total) by mouth every 6 (six) hours as needed for nausea or vomiting. What changed: You were already taking a medication with the same name, and this prescription was added. Make sure you understand how and when to take each.   oxyCODONE-acetaminophen 10-325 MG tablet Commonly known as: Percocet Take 1 tablet by mouth every 8 (eight) hours as needed for pain.   OXYGEN Inhale 3 L/min into the lungs.   polyethylene glycol 17 g packet Commonly known as: MIRALAX / GLYCOLAX Take 17 g by mouth daily as needed for moderate constipation.   senna-docusate 8.6-50 MG tablet Commonly known as: Senokot-S Take 2 tablets by mouth at bedtime for 7 days.   sodium chloride 1 g tablet Take 1 tablet (1 g total) by mouth 2 (two) times daily with a meal.   Xarelto 15 MG Tabs tablet Generic drug: Rivaroxaban TAKE 1 TABLET (15 MG TOTAL) BY MOUTH DAILY. What changed:  how much to take when to take this               Discharge Care Instructions  (From admission, onward)  Start     Ordered   06/05/21 0000  Discharge wound care:       Comments: Wash bilateral medial ankle wounds with saline, pat dry. Place Xeroform gauze over each wound, then beginning behind the toes and going to just below the knees, spiral wrap kerlex, then 4 inch ace wrap. Perform daily   06/05/21 1133   06/03/21 0000  Change dressing (specify)       Comments: Wash bilateral medial ankle wounds with saline, pat dry. Place Xeroform gauze over each wound, then beginning behind the toes and going to just below the  knees, spiral wrap kerlex, then 4 inch ace wrap. Perform daily   06/03/21 1459            Disposition and follow-up:   [name] was discharged from [facility] in Stable condition.  At the hospital follow up visit please address:  COVID-19 Infection (resolved) Day of positive test: 7/14, barrier to SNF placement. Pulmonary status unchanged from baseline, saturating appropriately on home oxygen requirement 3L [place]. Continues to remain afebrile. Given her chronic COPD, and recent recovery from aspiration PNA, she is at higher risk for COVID-related complications. - baseline supplemental oxygen 3L [place]; up titrate as needed - She has completed a 10 day isolation and is safe for de-isolation   Hypomagnesemia (stable)   Chronic Hyponatremia (Stable) Na 128 stable with salt tablets. Baseline 128-130. No changes in mental status. Hyponatremia studies done in 02/2021 pointed towards low solute state as likely etiology, however most recent exacerbation likely secondary to hypovolemia given low blood pressures recently. - Free water restriction liberalized to 1800 - IV Zofran for Nausea - meal encouragement - continue salt tablets   Aspiration Pneumonia (Resolved): Continues to saturate well on baseline 3L [place].  - Aspiration precautions   Atrial Fibrillation with RVR (Resolved) Remains in NSR. Cardiology has signed off. - Oral Amiodarone 200 mg daily - Continue Xarelto 15 mg daily              COPD Exacerbation (Resolved) Patient's respiratory status continues to remain stable on home oxygen 3L [place]. Will continue maintenance regimen.  - Continue home COPD medications (Yupelri, Brovana, Pulmicort) - Continue Duonebs q4 hours PRN   Transient Hypotension (resolved) Likely due to combinatorial effects of hyponatremia of 122, fluid restriction, and amiodarone use in the setting of cachexia. Will monitor hemodynamic stability and fluid resuscitate as needed.    Sepsis/septic shock (resolved) - patient was admitted for aspiration pneumonia. Sepsis was ruled in based on findings of hypotension, tachycardia, leukocytosis, and elevated lactic acid in the setting of aspiration pneumonia. This has now resolved.   Bilateral lower extremity venous stasis ulcers 2/2 Chronic venous insufficiency Continue daily dressing changes.  2.  Labs / imaging needed at time of follow-up: BMP, Mg, Phos  3.  Pending labs/ test needing follow-up: none  Follow-up Appointments:  Follow-up Information     Freada Bergeron, MD Follow up in 3 week(s).   Specialties: Cardiology, Radiology Why: For Atrial Fibrillation w/ RVR s/p  Amiodarone Taper Contact information: [address] [place] [address] [phone]         [name], MD. Schedule an appointment as soon as possible for a visit in 1 week(s).   Specialty: Internal Medicine Contact information: [place]  [address] [phone]               - follow up with PCP in 1  week at [facility]. - Follow up with Cardiology in 3-4 weeks after discharge  Hospital Course by problem list: COPD Exacerbation; Chronic COPD Presented with increased oxygen requirement of 5L on non-rebreather and lung hyperinflation with diaphragmatic flattening bilaterally on CXR in the setting of increasing cough and sputum production. Likely triggered by concurrent aspiration pneumonia. Was managed by home COPD medications Maretta Bees, Brovana, and Pulmicort) and PRN Duonebulizer treatment. Oxygen requirement had returned to baseline of 3L [place] and cough resolved by discharge. 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 Therefore, she was also seen by Palliative Care team during this admission during which she expressed a desire to change her code status to DNR and that she prefers quality over quantity of life. Palliative  recommended discharge with home health PT and Palliative Care. Reassessed the patient prior to discharge and felt the patient would benefit most from SNF placement.  Just prior to discharge to SNF, patient incidentally tested positive for COVID. Due to recent treatment for COPD and aspiration pneumonia, there was concern that she would be high risk for COVID complications. Fortunately, she remained asymptomatic throughout the duration of her isolation. Se completed a 10 day isolation and was able to be discharged to SNF.   Aspiration Pneumonia Initially presented with increased oxygen requirement from baseline, leukocytosis of 24.2, procalcitonin, peak procalcitonin of 1.73, lactic acid 2.5. CXR x2 showed bilateral interstitial prominence concerning for aspiration pneumonitis/pneumonia. She completed a course of antibiotics. Blood culutres showed no growth. By day of discharge leukocytosis, lactic acid levels and procalcitonin were improved and patient was back on home oxygen requirement of 3L [place].   Atrial Fibrillation with Rapid Ventricular Rate; Hx of Paroxysmal A-fib Presented with Atrial Fibrillation with RVR and heart rates elevated in the 301-601U with systolic blood pressures 93-23F concerning for cardiogenic shock. She was cardioverted three times in the ED with no success. Cardiology was consulted and amiodarone drip was started and she converted to normal sinus rhythm with regular heart rates. She was started anticoagulated with Xarelto.  After control of of her heart rate was achieved, she was transitioned to PO amiodarone. She continued to endorse some nausea with poor PO intake occasionally thought to be a side effect of the amiodarone. This was managed sufficiently with zofran.    Hypotension Presented with SBPs 80s-90s that persisted despite fluid resuscitation concerning for cardiogenic vs septic shock. Pressures improved on Levophed drip with the exception intermittent drops in blood  pressures likely attributable to poor PO intake in the setting of acute illness. Adrenal Insufficiency workup in the setting of concurrent chronic hyponatremia was unremarkable. Home Metoprolol was held during this admission. Pressures normotensive and stable on day of discharge.   Chronic Hyponatremia Patient has chronic hyponatremia, baseline 128-130. Hyponatremia workup consistent with total body sodium depletion. Adrenal insufficiency workup unremarkable. No mental status changes during hospitalization. She was started on sodium tablets due to persistently declining sodium levels with improvement and stabilization.   Bilateral Lower Extremity Ulcers; Hx of Chronic Venous Insufficiency Presented with bilateral chronic wounds on bilateral lower extremities. Wound Care team was consulted and daily dressing changes were completed as per their recommendations.  Discharge Exam:   BP (!) 94/44   Pulse (!) 59   Temp 97.6 F (36.4 C)   Resp 13   Ht _0  (1.778 m)   Wt 46.4 kg   SpO2 100%   BMI 14.68 kg/m  Discharge exam:  Constitutional: tired-appearing, cachetic female resting, in no acute distress Cardiovascular: regular rate and rhythm Pulmonary/Chest: normal work of breathing on 3L [place] MSK: poor bulk and tone. Bilateral lower extremities wrapped in dressing. Neurological: alert & oriented x 3, answers questions appropriately  Psych: affect depressed, mood congruent  Pertinent Labs, Studies, and Procedures:  CXR:  - Cardiomegaly. Bilateral interstitial prominence, right side greater than left. Findings suggest interstitial edema. Pneumonitis cannot be excluded. Similar findings noted on prior exam. Small bilateral pleural effusions. -COPD.  EKG: - normal sinus rhythm. Burst of a-fib absent compared to prior  Discharge Instructions: You were admitted to the hospital for treatment of  Aspiration pneumonia COPD Exacerbation Atrial Fibrillation with Rapid Ventricular  Rates COVID-19  Your aspiration pneumonia improved with 5 days of IV antibiotic treatment and you completed your antibiotics during your hospital stay.   Your atrial fibrillation improved with amiodarone infusion. On discharge, please continue taking amiodarone 200 mg daily and follow up with cardiology.   For your COPD exacerbation and chronic COPD illness, you received nebulizer treatments and continued your home COPD medications. The palliative care team visited you to explore your goals moving forward and you indicated that you would like to change your code status to DNR.  Please inform your primary care doctor about this discussion so that they can update their documentation.   Please continue taking Amiodarone 212m and Xarelto 18m  Please continue taking the salt tablets each day.                             [facility] They will call you to inform you of day and time.   In the meantime please call 911 or go to your nearest emergency department if you begin to have chest pain, fevers, shortness of breath different from what is normal to you, dizziness, sudden confusion, weakness in arms or legs, or speech difficulties.   Signed: GaDelene RuffiniMD 06/17/2021, 11:35 AM   Pager: _0 @

## 2021-06-01 NOTE — Progress Notes (Signed)
Progress Note  Patient Name: Kristen [name] Date of Encounter: 06/01/2021  [facility] Cardiologist: Johney Frame   Subjective   75 year old female with known chronic aspiration.  She developed a fibrillation with rapid ventricular response.  She is converted with IV amiodarone. Is on PO amio  She has had nausea since we started the oral amiodarone.  I suspect that it is due to the high-dose amiodarone.  We will we will lower her dose to 200 mg a day at this point.  Continue Xarelto.   Inpatient Medications    Scheduled Meds:  (feeding supplement) PROSource Plus  30 mL Oral BID BM   amiodarone  400 mg Oral BID   amoxicillin-clavulanate  1 tablet Oral Q12H   arformoterol  15 mcg Nebulization BID   budesonide (PULMICORT) nebulizer solution  0.5 mg Nebulization BID   famotidine  20 mg Oral Daily   feeding supplement  1 Container Oral BID BM   multivitamin with minerals  1 tablet Oral Daily   revefenacin  175 mcg Nebulization Daily   Rivaroxaban  15 mg Oral Q supper   Continuous Infusions:  sodium chloride 10 mL/hr at 05/30/21 1214   PRN Meds: sodium chloride, acetaminophen, docusate sodium, ipratropium-albuterol, morphine injection, oxyCODONE-acetaminophen, polyethylene glycol   Vital Signs    Vitals:   05/31/21 1939 05/31/21 1951 06/01/21 0451 06/01/21 0726  BP:  123/75 127/69   Pulse: 66 75 63 (!) 57  Resp: _0 Temp:  97.9 F (36.6 C) 97.9 F (36.6 C)   TempSrc:  Oral Oral   SpO2: 98% 100% 96%   Weight:   49 kg   Height:        Intake/Output Summary (Last 24 hours) at 06/01/2021 1012 Last data filed at 06/01/2021 0500 Gross per 24 hour  Intake 1327.85 ml  Output 1600 ml  Net -272.15 ml    Last 3 Weights 06/01/2021 05/31/2021 05/30/2021  Weight (lbs) 108 lb 93 lb 90 lb  Weight (kg) 48.988 kg 42.185 kg 40.824 kg      Telemetry   NSR - Personally Reviewed  ECG     - Personally Reviewed  Physical Exam   Physical Exam: Blood pressure 127/69, pulse  (!) 57, temperature 97.9 F (36.6 C), temperature source Oral, resp. rate 16, height 5' 10" (1.778 m), weight 49 kg, SpO2 96 %.  GEN:  Well nourished, well developed in no acute distress HEENT: Normal NECK: No JVD; No carotid bruits LYMPHATICS: No lymphadenopathy CARDIAC: RRR , no murmurs, rubs, gallops RESPIRATORY:  basilar rales , R> L  ABDOMEN: Soft, non-tender, non-distended MUSCULOSKELETAL:  No edema; No deformity  SKIN: Warm and dry NEUROLOGIC:  Alert and oriented x 3   Labs    High Sensitivity Troponin:   Recent Labs  Lab 05/27/21 1220 05/27/21 1519  TROPONINIHS 18* 51*       Chemistry Recent Labs  Lab 05/27/21 1220 05/28/21 0437 05/30/21 0736 05/31/21 0120 06/01/21 0136  NA 128*   < > 129* 129* 129*  K 4.4   < > 4.0 3.8 5.4*  CL 89*   < > 94* 93* 93*  CO2 27   < > _1 GLUCOSE 130*   < > 114* 94 91  BUN 12   < > _2 CREATININE 0.81   < > 0.68 0.60 0.80  CALCIUM 8.3*   < > 8.1* 7.6* 7.8*  PROT 5.4*  --  4.7*  --   --  ALBUMIN 1.9*  --  1.6*  --   --   AST 17  --  17  --   --   ALT 8  --  9  --   --   ALKPHOS 124  --  72  --   --   BILITOT 1.1  --  0.3  --   --   GFRNONAA >60   < > >60 >60 >60  ANIONGAP 12   < > _0 < > = values in this interval not displayed.      Hematology Recent Labs  Lab 05/30/21 0736 05/31/21 0120 06/01/21 0136  WBC 5.9 8.0 7.7  RBC 3.32* 3.16* 3.08*  HGB 9.6* 9.1* 9.1*  HCT 30.2* 28.3* 28.0*  MCV 91.0 89.6 90.9  MCH 28.9 28.8 29.5  MCHC 31.8 32.2 32.5  RDW 13.5 13.4 13.7  PLT 446* 454* 426*     BNP Recent Labs  Lab 05/27/21 1220  BNP 526.6*      DDimer No results for input(s): DDIMER in the last 168 hours.   Radiology    No results found.  Cardiac Studies      Patient Profile     75 y.o. female    Kristen [name]    1.  Atrial fibrillation with rapid ventricular response. Her CHA2DS2-VASc score is 4.  Continue   Xarelto.  She did not tolerate metoprolol due to hypotension.   She was started on oral amiodarone yesterday.  She is developed nausea which which I suspect is due to the high-dose amiodarone.  I will reduce her dose and see if this helps her nausea.  From a cardiac standpoint she remains very stable.  She is okay from discharge from our standpoint..  She will follow-up with Dr. Johney Frame.  2. COPD :  plans per IM       For questions or updates, please contact [facility] Please consult [email or web address] for contact info under        Signed, Mertie Moores, MD  06/01/2021, 10:12 AM

## 2021-06-01 NOTE — Evaluation (Signed)
Occupational Therapy Evaluation Patient Details Name: Kristen [name] MRN: [id number] DOB: 03-25-1946 Today's Date: 06/01/2021    History of Present Illness 75 yo admitted 7/5 with SOB with AFib with RVR. Attempted cardioversion x 3 unsuccessful. PMHx: COPD on home O2, chronic Afib, cellulitis, hyponatremia,Chronic venous stasis dermatitis   Clinical Impression   Pt admitted for concerns listed above. PTA pt reported independence with all ADL's and IADL's, using no DME. PT presenting with increased weakness and decreased activity tolerance. Pt requiring up to min guard assist for all ADL's due to the increased weakness. Acute OT will follow to address deficits below.     Follow Up Recommendations  Home health OT;Supervision - Intermittent    Equipment Recommendations  None recommended by OT    Recommendations for Other Services       Precautions / Restrictions Precautions Precautions: Fall;Other (comment) Precaution Comments: watch HR Restrictions Weight Bearing Restrictions: No      Mobility Bed Mobility Overal bed mobility: Needs Assistance Bed Mobility: Supine to Sit;Sit to Supine     Supine to sit: Min guard;HOB elevated Sit to supine: Supervision;HOB elevated   General bed mobility comments: for safety    Transfers Overall transfer level: Needs assistance Equipment used: 1 person hand held assist Transfers: Sit to/from Stand Sit to Stand: Min guard         General transfer comment: guarding with good hand placement from surface.    Balance Overall balance assessment: Needs assistance Sitting-balance support: No upper extremity supported;Feet supported Sitting balance-Leahy Scale: Fair     Standing balance support: Single extremity supported Standing balance-Leahy Scale: Poor                             ADL either performed or assessed with clinical judgement   ADL Overall ADL's : Needs assistance/impaired Eating/Feeding:  Independent;Sitting   Grooming: Set up;Sitting   Upper Body Bathing: Supervision/ safety;Sitting   Lower Body Bathing: Min guard;Sitting/lateral leans;Sit to/from stand   Upper Body Dressing : Independent;Sitting   Lower Body Dressing: Min guard;Sitting/lateral leans;Sit to/from stand   Toilet Transfer: Min guard;Ambulation   Toileting- Clothing Manipulation and Hygiene: Min guard;Sitting/lateral lean;Sit to/from stand   Tub/ Shower Transfer: Min guard;Ambulation   Functional mobility during ADLs: Min guard General ADL Comments: Pt is overall independent to min guard for all ADL's and functional mobility     Vision Baseline Vision/History: Wears glasses Wears Glasses: Reading only Patient Visual Report: No change from baseline Vision Assessment?: No apparent visual deficits     Perception Perception Perception Tested?: No   Praxis Praxis Praxis tested?: Not tested    Pertinent Vitals/Pain Pain Assessment: No/denies pain     Hand Dominance Right   Extremity/Trunk Assessment Upper Extremity Assessment Upper Extremity Assessment: Generalized weakness   Lower Extremity Assessment Lower Extremity Assessment: Defer to PT evaluation   Cervical / Trunk Assessment Cervical / Trunk Assessment: Kyphotic   Communication Communication Communication: No difficulties   Cognition Arousal/Alertness: Awake/alert Behavior During Therapy: WFL for tasks assessed/performed Overall Cognitive Status: Within Functional Limits for tasks assessed                                     General Comments  VSS on RA    Exercises     Shoulder Instructions      Home Living Family/patient expects to be discharged to:: Private  residence Living Arrangements: Other relatives Available Help at Discharge: Family;Available 24 hours/day Type of Home: House Home Access: Level entry     Home Layout: One level     Bathroom Shower/Tub: Occupational psychologist:  Standard Bathroom Accessibility: Yes How Accessible: Accessible via walker Home Equipment: [employer] - single point;Shower seat;Wheelchair - [employer] - 2 wheels;Walker - 4 wheels          Prior Functioning/Environment Level of Independence: Independent        Comments: 3L at home        OT Problem List: Decreased strength;Decreased activity tolerance;Impaired balance (sitting and/or standing)      OT Treatment/Interventions: Self-care/ADL training;Therapeutic exercise;DME and/or AE instruction;Therapeutic activities;Patient/family education;Balance training;Energy conservation    OT Goals(Current goals can be found in the care plan section) Acute Rehab OT Goals Patient Stated Goal: to go home OT Goal Formulation: With patient Time For Goal Achievement: 06/15/21 Potential to Achieve Goals: Good ADL Goals Pt Will Perform Grooming: with modified independence;standing Pt Will Transfer to Toilet: with modified independence;ambulating Pt Will Perform Toileting - Clothing Manipulation and hygiene: with modified independence;sitting/lateral leans;sit to/from stand Additional ADL Goal #1: Pt will report 3 energy conservation techniques she can use at home.  OT Frequency: Min 2X/week   Barriers to D/C:            Co-evaluation              AM-PAC OT "6 Clicks" Daily Activity     Outcome Measure Help from another person eating meals?: None Help from another person taking care of personal grooming?: A Little Help from another person toileting, which includes using toliet, bedpan, or urinal?: A Little Help from another person bathing (including washing, rinsing, drying)?: A Little Help from another person to put on and taking off regular upper body clothing?: None Help from another person to put on and taking off regular lower body clothing?: A Little 6 Click Score: 20   End of Session Equipment Utilized During Treatment: Oxygen Nurse Communication: Mobility  status  Activity Tolerance: Patient tolerated treatment well Patient left: in bed;with call bell/phone within reach;with bed alarm set  OT Visit Diagnosis: Unsteadiness on feet (R26.81);Other abnormalities of gait and mobility (R26.89);Muscle weakness (generalized) (M62.81)                Time: 1100-3496 OT Time Calculation (min): 16 min Charges:  OT General Charges $OT Visit: 1 Visit OT Evaluation $OT Eval Moderate Complexity: 1 Mod  Evadne Ose H., OTR/L Acute Rehabilitation  Jaquay Morneault Elane Yolanda Bonine 06/01/2021, 11:33 PM

## 2021-06-02 DIAGNOSIS — I4891 Unspecified atrial fibrillation: Secondary | ICD-10-CM | POA: Diagnosis not present

## 2021-06-02 DIAGNOSIS — J690 Pneumonitis due to inhalation of food and vomit: Secondary | ICD-10-CM | POA: Diagnosis not present

## 2021-06-02 LAB — BASIC METABOLIC PANEL
Anion gap: 8 (ref 5–15)
Anion gap: 5 (ref 5–15)
Anion gap: 4 — ABNORMAL LOW (ref 5–15)
BUN: 7 mg/dL — ABNORMAL LOW (ref 8–23)
BUN: 9 mg/dL (ref 8–23)
BUN: 9 mg/dL (ref 8–23)
CO2: 32 mmol/L (ref 22–32)
CO2: 35 mmol/L — ABNORMAL HIGH (ref 22–32)
CO2: 36 mmol/L — ABNORMAL HIGH (ref 22–32)
Calcium: 7.9 mg/dL — ABNORMAL LOW (ref 8.9–10.3)
Calcium: 7.8 mg/dL — ABNORMAL LOW (ref 8.9–10.3)
Calcium: 7.9 mg/dL — ABNORMAL LOW (ref 8.9–10.3)
Chloride: 85 mmol/L — ABNORMAL LOW (ref 98–111)
Chloride: 86 mmol/L — ABNORMAL LOW (ref 98–111)
Chloride: 87 mmol/L — ABNORMAL LOW (ref 98–111)
Creatinine, Ser: 0.59 mg/dL (ref 0.44–1.00)
Creatinine, Ser: 0.76 mg/dL (ref 0.44–1.00)
Creatinine, Ser: 0.75 mg/dL (ref 0.44–1.00)
GFR, Estimated: >60 mL/min (ref >60)
GFR, Estimated: >60 mL/min (ref >60)
GFR, Estimated: >60 mL/min (ref >60)
Glucose, Bld: 126 mg/dL — ABNORMAL HIGH (ref 70–99)
Glucose, Bld: 107 mg/dL — ABNORMAL HIGH (ref 70–99)
Glucose, Bld: 106 mg/dL — ABNORMAL HIGH (ref 70–99)
Potassium: 4.6 mmol/L (ref 3.5–5.1)
Potassium: 4.8 mmol/L (ref 3.5–5.1)
Potassium: 4.8 mmol/L (ref 3.5–5.1)
Sodium: 125 mmol/L — ABNORMAL LOW (ref 135–145)
Sodium: 126 mmol/L — ABNORMAL LOW (ref 135–145)
Sodium: 127 mmol/L — ABNORMAL LOW (ref 135–145)

## 2021-06-02 MED ORDER — SODIUM CHLORIDE 0.9 % IV SOLN: INTRAVENOUS | Status: AC

## 2021-06-02 NOTE — ED Provider Notes (Addendum)
Medical screening examination/treatment/procedure(s) were conducted as a shared visit with non-physician practitioner(s) and myself.  I personally evaluated the patient during the encounter.  .Cardioversion  Date/Time: 06/02/2021 7:11 AM Performed by: Margette Fast, MD Authorized by: Margette Fast, MD   Consent:    Consent obtained:  Written   Consent given by:  Patient   Risks discussed:  Cutaneous burn, death, induced arrhythmia and pain   Alternatives discussed:  Rate-control medication Pre-procedure details:    Cardioversion basis:  Emergent   Rhythm:  Atrial fibrillation   Electrode placement:  Anterior-posterior Patient sedated: Yes. Refer to sedation procedure documentation for details of sedation.  Attempt one:    Cardioversion mode:  Synchronous   Shock (Joules):  100   Shock outcome:  No change in rhythm Attempt two:    Cardioversion mode:  Synchronous   Shock (Joules):  120   Shock outcome:  No change in rhythm Attempt three:    Cardioversion mode:  Synchronous   Shock (Joules):  150   Shock outcome:  No change in rhythm Post-procedure details:    Patient status:  Awake   Patient tolerance of procedure:  Tolerated well, no immediate complications .Sedation  Date/Time: 06/02/2021 7:12 AM Performed by: Margette Fast, MD Authorized by: Margette Fast, MD   Consent:    Consent obtained:  Written   Consent given by:  Patient   Risks discussed:  Allergic reaction, dysrhythmia, inadequate sedation, nausea, vomiting, respiratory compromise necessitating ventilatory assistance and intubation, prolonged sedation necessitating reversal and prolonged hypoxia resulting in organ damage Universal protocol:    Immediately prior to procedure, a time out was called: yes     Patient identity confirmed:  Verbally with patient and arm band Indications:    Procedure performed:  Cardioversion Pre-sedation assessment:    Time since last food or drink:  6 hours   ASA  classification: class 4 - patient with severe systemic disease that is a constant threat to life     Mallampati score:  II - soft palate, uvula, fauces visible   Pre-sedation assessments completed and reviewed: airway patency, cardiovascular function, hydration status, mental status, nausea/vomiting, pain level, respiratory function and temperature   Immediate pre-procedure details:    Reassessment: Patient reassessed immediately prior to procedure     Reviewed: vital signs     Verified: bag valve mask available, emergency equipment available, intubation equipment available, IV patency confirmed, oxygen available and suction available   Procedure details (see MAR for exact dosages):    Preoxygenation:  Nasal cannula   Sedation:  Etomidate   Intended level of sedation: deep   Intra-procedure monitoring:  Blood pressure monitoring, cardiac monitor, continuous pulse oximetry, frequent LOC assessments, frequent vital sign checks and continuous capnometry   Total Provider sedation time (minutes):  30 Post-procedure details:    Attendance: Constant attendance by certified staff until patient recovered     Recovery: Patient returned to pre-procedure baseline     Post-sedation assessments completed and reviewed: airway patency, cardiovascular function, hydration status, mental status, nausea/vomiting, pain level, respiratory function and temperature     Patient is stable for discharge or admission: yes     Procedure completion:  Tolerated well, no immediate complications   Nanda Quinton, MD Emergency Medicine    Laniyah Rosenwald, [facility], MD 06/02/21 9295    Margette Fast, MD 06/02/21 [phone]

## 2021-06-02 NOTE — Progress Notes (Addendum)
Progress Note  Patient Name: Kristen [name] Date of Encounter: 06/02/2021  [facility] Cardiologist: Freada Bergeron, MD   Subjective   Patient states she is doing a little better, still feeling nausea, denied emesis, heart palpitation, chest pain, dizziness. She has quit smoking few month ago.    Inpatient Medications    Scheduled Meds:  (feeding supplement) PROSource Plus  30 mL Oral BID BM   amiodarone  200 mg Oral Daily   arformoterol  15 mcg Nebulization BID   budesonide (PULMICORT) nebulizer solution  0.5 mg Nebulization BID   famotidine  20 mg Oral Daily   feeding supplement  1 Container Oral BID BM   multivitamin with minerals  1 tablet Oral Daily   revefenacin  175 mcg Nebulization Daily   Rivaroxaban  15 mg Oral Q supper   Continuous Infusions:  sodium chloride 10 mL/hr at 05/30/21 1214   PRN Meds: sodium chloride, acetaminophen, docusate sodium, ipratropium-albuterol, morphine injection, ondansetron, oxyCODONE-acetaminophen, polyethylene glycol   Vital Signs    Vitals:   06/01/21 2029 06/02/21 0450 06/02/21 0500 06/02/21 0830  BP:  114/71    Pulse:  63  60  Resp:  16  18  Temp:  97.9 F (36.6 C)    TempSrc:  Oral    SpO2: 99% 95%  100%  Weight:   44.8 kg   Height:        Intake/Output Summary (Last 24 hours) at 06/02/2021 0858 Last data filed at 06/02/2021 0817 Gross per 24 hour  Intake 480.76 ml  Output 2600 ml  Net -2119.24 ml   Last 3 Weights 06/02/2021 06/01/2021 05/31/2021  Weight (lbs) 98 lb 12.8 oz 108 lb 93 lb  Weight (kg) 44.815 kg 48.988 kg 42.185 kg      Telemetry    Sinus rhythm with ventricular rate of 60s - Personally Reviewed  ECG    N/A this AM - Personally Reviewed  Physical Exam   GEN: No acute distress. Cachexia.  Neck: No JVD Cardiac: RRR, no murmurs, rubs, or gallops.  Respiratory: Bilateral wheezing. On 3LNC. Speaks in full sentence.  GI: Soft, nontender, non-distended  MS: BLE with ACE warp.  Neuro:  Nonfocal   Psych: Normal affect   Labs    High Sensitivity Troponin:   Recent Labs  Lab 05/27/21 1220 05/27/21 1519  TROPONINIHS 18* 51*      Chemistry Recent Labs  Lab 05/27/21 1220 05/28/21 0437 05/30/21 0736 05/31/21 0120 06/01/21 0136 06/01/21 1246  NA 128*   < > 129* 129* 129*  --   K 4.4   < > 4.0 3.8 5.4* 5.0  CL 89*   < > 94* 93* 93*  --   CO2 27   < > _0 --   GLUCOSE 130*   < > 114* 94 91  --   BUN 12   < > _1 --   CREATININE 0.81   < > 0.68 0.60 0.80  --   CALCIUM 8.3*   < > 8.1* 7.6* 7.8*  --   PROT 5.4*  --  4.7*  --   --   --   ALBUMIN 1.9*  --  1.6*  --   --   --   AST 17  --  17  --   --   --   ALT 8  --  9  --   --   --   ALKPHOS 124  --  72  --   --   --   BILITOT 1.1  --  0.3  --   --   --   GFRNONAA >60   < > >60 >60 >60  --   ANIONGAP 12   < > _0 --    < > = values in this interval not displayed.     Hematology Recent Labs  Lab 05/30/21 0736 05/31/21 0120 06/01/21 0136  WBC 5.9 8.0 7.7  RBC 3.32* 3.16* 3.08*  HGB 9.6* 9.1* 9.1*  HCT 30.2* 28.3* 28.0*  MCV 91.0 89.6 90.9  MCH 28.9 28.8 29.5  MCHC 31.8 32.2 32.5  RDW 13.5 13.4 13.7  PLT 446* 454* 426*    BNP Recent Labs  Lab 05/27/21 1220  BNP 526.6*     DDimer No results for input(s): DDIMER in the last 168 hours.   Radiology    No results found.  Cardiac Studies   Echo from 10/23/2020:   1. Left ventricular ejection fraction, by estimation, is 55 to 60%. The  left ventricle has normal function. The left ventricle has no regional  wall motion abnormalities. Left ventricular diastolic parameters are  consistent with Grade I diastolic dysfunction (impaired relaxation). There is the interventricular septum is flattened in diastole ('D' shaped left ventricle), consistent with right ventricular volume overload.   2. Right ventricular systolic function is low normal. The right  ventricular size is normal.   3. The mitral valve is grossly normal. No evidence of mitral  valve  regurgitation.   4. The aortic valve is tricuspid. There is mild calcification of the  aortic valve. There is mild thickening of the aortic valve. Aortic valve  regurgitation is not visualized.   5. The inferior vena cava is normal in size with greater than 50%  respiratory variability, suggesting right atrial pressure of 3 mmHg.   Patient Profile     Kristen [name] is a 75 y.o. female with a PMH of paroxysmal atrial fibrillation diagnosed in January 2022 on Xarelto, tobacco abuse, oxygen dependent COPD, bilateral ankle wound, chronic venous stasis dermatitis, and chronic hyponatremia, admitted for aspiration pneumonia. Cardiology is following since 05/27/2021 for atrial fibrillation with RVR.   Assessment & Plan    Paroxysmal A fib with RVR - Presented with SOB and hypotension, admitted for sepsis 2/2 aspiration pneumonia  - Attempted DCCV x3 in the ER without success and subsequently placed on amiodarone gtt, not tolerate metoprolol due to hypotension, initially converted to NSR on 05/28/21 but had recurrence of A fib RVR after requiring amiodarone re-bolus, currently remained in NSR and transitioned to PO amiodarone loading 416m BID x7 days and 2040mdaily after; due to nausea, amiodarone reduced to 20039maily yesterday - CHADS2VASC score 4, continued anticoagulation on Xarelto 74m6mily  - follow up with cardiology outpatient arranged on 06/17/21, patient agreeable   Sepsis 2/2 Aspiration pneumonia - completed full course Unasyn  - managed per IM  Nausea: remains poorly controlled, +BM, no emesis  COPD exacerbation: wheezing today  BLE venous stasis ulcers   Chronic venous insufficiency Anemia  Chronic hyponatremia: labs pending today - managed per IM    For questions or updates, please contact [facility] Please consult [email or web address] for contact info under   History and all data above reviewed.  Patient examined.  I agree with the findings as above.  She feels OK.  No  pain.  No SOB.  The patient exam reveals COR:RRR  ,  Lungs:  Decreased breath sounds  ,  Abd: Positive bowel sounds, no rebound no guarding, Ext No edema  .  All available labs, radiology testing, previous records reviewed. Agree with documented assessment and plan.  Atrial fib:  Now in  NSR.  Continue current therapy.  Tolerating anticoagulation.  We will follow as needed.    Jeneen Rinks Chidera Dearcos  10:35 AM  06/02/2021        Signed, Margie Billet, NP  06/02/2021, 8:58 AM

## 2021-06-02 NOTE — TOC Initial Note (Signed)
Transition of Care [facility]) - Initial/Assessment Note    Patient Details  Name: Kristen [name] MRN: [id number] Date of Birth: [birth year]  Transition of Care [facility]) CM/SW Contact:    Bethena Roys, RN Phone Number: 06/02/2021, 5:21 PM  Clinical Narrative:  Risk for readmission assessment completed. Case Manager spoke with the patients sister that is 76 years old. Previously, the patient was from home with the sister; however, she was walking. Sister [name] states that if the patient is not walking the patient cannot safely come home. The sister states she can't hold the patient up or pull on the patient if she returns home. Per sister, the patient needs to be mobile. Physical Therapy notes stated that the patient pivoted to the bedside commode. Case Manager suggested that the patient may benefit from rehab short-term initially to gain some strength and work on balance. Sister is thinking about SNF and the CSW will touch base with [name] on Tuesday. [name] had questions regarding palliative care- she was under the impression that someone from palliative would be caring for her sister during the day. Case Manager explained to [name] how palliative is utilized. TOC team will continue to follow for additional transition of care needs.                   Expected Discharge Plan: Skilled Nursing Facility Barriers to Discharge: Continued Medical Work up   Expected Discharge Plan and Services Expected Discharge Plan: [place] In-house Referral: Clinical Social Work Discharge Planning Services: CM Consult Post Acute Care Choice: [place] Living arrangements for the past 2 months: [place]                   DME Agency: NA    Prior Living Arrangements/Services Living arrangements for the past 2 months: [place] Lives with:: Self, Siblings (Pt lives with her sister that is 2 years old.) Patient language and need for interpreter  reviewed:: Yes Do you feel safe going back to the place where you live?: Yes      Need for Family Participation in Patient Care: Yes (Comment) Care giver support system in place?: Yes (comment) Current home services: DME (Patient's sister has a rolling walker at home- no other DME.) Criminal Activity/Legal Involvement Pertinent to Current Situation/Hospitalization: No - Comment as needed  Activities of Daily Living Home Assistive Devices/Equipment: None ADL Screening (condition at time of admission) Patient's cognitive ability adequate to safely complete daily activities?: Yes Is the patient deaf or have difficulty hearing?: No Does the patient have difficulty seeing, even when wearing glasses/contacts?: No Does the patient have difficulty concentrating, remembering, or making decisions?: No Patient able to express need for assistance with ADLs?: Yes Does the patient have difficulty dressing or bathing?: No Independently performs ADLs?: Yes (appropriate for developmental age) Does the patient have difficulty walking or climbing stairs?: No Weakness of Legs: Both (patient states it's associated with the pain from her venous stasis uclers) Weakness of Arms/Hands: None  Permission Sought/Granted Permission sought to share information with : Family Supports, Case Manager      Emotional Assessment Appearance:: Appears stated age Attitude/Demeanor/Rapport: Unable to Assess Affect (typically observed): Unable to Assess   Alcohol / Substance Use: Not Applicable Psych Involvement: No (comment)  Admission diagnosis:  COPD (chronic obstructive pulmonary disease) ([place]) [J44.9] Atrial fibrillation with RVR (HCC) [I48.91] Hypotension, unspecified hypotension type [I95.9] Patient Active Problem List   Diagnosis Date Noted   Protein-calorie malnutrition, severe 05/29/2021  CAP (community acquired pneumonia) 05/29/2021   Septic shock ([place]) 05/29/2021   Aspiration pneumonia ([place]) 05/29/2021    Atrial fibrillation with RVR ([place]) 05/27/2021   Cellulitis of left lower extremity 05/06/2021   Aortic atherosclerosis ([place]) 03/07/2021   Opiate use 03/05/2021   Need for assessment by dentistry for poor dentition 03/05/2021   Abdominal pain 02/06/2021   Health care maintenance 12/27/2020   Paroxysmal atrial fibrillation with rapid ventricular response ([place]) 12/16/2020   Nausea 11/07/2020   Lower extremity ulceration ([place]) 10/04/2020   Generalized anxiety disorder 08/27/2020   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[place])    Smoking 07/18/2020   Venous insufficiency of both lower extremities 07/18/2020   COPD (chronic obstructive pulmonary disease) ([place]) 06/28/2020   Protein-calorie malnutrition ([place]) 06/28/2020   Hyponatremia 06/28/2020   PCP:  [name], MD Pharmacy:   [id number] [place] [place] [address]Phone: [phone]: [phone]  Readmission Risk Interventions Readmission Risk Prevention Plan 06/02/2021 09/29/2020  Transportation Screening Complete Complete  PCP or Specialist Appt within 5-7 Days - Complete  Home Care Screening - Complete  Medication Review (RN CM) - Complete  Medication Review ([place] Complete -  PCP or Specialist appointment within 3-5 days of discharge Complete -  [place] Home Care Consult Complete -  SW Recovery Care/Counseling Consult Complete -  Palliative Care Screening Complete -  [place] -  Some recent data might be hidden

## 2021-06-02 NOTE — Care Management Important Message (Signed)
Important Message  Patient Details  Name: Kristen [name] MRN: [id number] Date of Birth: Jul 27, 1946   Medicare Important Message Given:  Yes     Shelda Altes 06/02/2021, 10:15 AM

## 2021-06-02 NOTE — Progress Notes (Signed)
HD#6 SUBJECTIVE:  O/N Events: No acute events overnight  Kristen [name] endorses some continued nausea that comes and goes. Says she has been awake since about 300. The nausea comes with dry heaves, and she doesn't really know what brings it on. She says she could be hungry and get ready to eat but then all of the sudden get nauseous and lose her appetite. She was unable to eat much for lunch or dinner yesterday, but did have some italian ice around dinner time. She states the nausea has improved with Zofran and that she was able to eat her lunch today. She shares that she would like to stay today to regain her strength.    OBJECTIVE:   Vital Signs: Vitals:   06/02/21 0450 06/02/21 0500 06/02/21 0830 06/02/21 1220  BP: 114/71   122/87  Pulse: 63  60   Resp: _0 Temp: 97.9 F (36.6 C)     TempSrc: Oral     SpO2: 95%  100%   Weight:  44.8 kg    Height:       Supplemental O2: Nasal Cannula SpO2: 100 % O2 Flow Rate (L/min): 3 L/min  Filed Weights   05/31/21 0653 06/01/21 0451 06/02/21 0500  Weight: 42.2 kg 49 kg 44.8 kg     Intake/Output Summary (Last 24 hours) at 06/02/2021 1353 Last data filed at 06/02/2021 1346 Gross per 24 hour  Intake 480.76 ml  Output 3100 ml  Net -2619.24 ml    Net IO Since Admission: 565.74 mL [06/02/21 1353]  BP 127/69 (BP Location: Right Arm)   Pulse (!) 57   Temp 97.9 F (36.6 C) (Oral)   Resp 16   Ht _1  (1.778 m)   Wt 49 kg   SpO2 96%   BMI 15.50 kg/m   Constitutional: Weak-appearing, cachetic female sitting up in bed, in no acute distress Cardiovascular: regular rate and rhythm, no murmurs/rubs/gallops Pulmonary/Chest: normal work of breathing on 3L [place], diffuse wheezes in bilateral lung fields Abdominal: soft, non-tender, non-distended MSK: poor bulk and tone Neurological: alert & oriented x 3, answers questions appropriately  Skin: warm and dry Psych: affect mood congruent   Patient Lines/Drains/Airways Status      Active Line/Drains/Airways     Name Placement date Placement time Site Days   Peripheral IV 05/27/21 20 G Left Antecubital 05/27/21  1225  Antecubital  2   Peripheral IV 05/27/21 20 G Right Antecubital 05/27/21  1310  Antecubital  2   Peripheral IV 05/27/21 20 G 1" Anterior;Left;Proximal;Upper Arm 05/27/21  1940  Arm  2   External Urinary Catheter 05/27/21  1707  --  2   Wound / Incision (Open or Dehisced) 02/24/21 Venous stasis ulcer Ankle Anterior;Right 02/24/21  1011  Ankle  94   Wound / Incision (Open or Dehisced) 05/27/21 Venous stasis ulcer Ankle Left;Medial 05/27/21  1707  Ankle  2            Labs: CBC Latest Ref Rng & Units 06/01/2021 05/31/2021 05/30/2021  WBC 4.0 - 10.5 K/uL 7.7 8.0 5.9  Hemoglobin 12.0 - 15.0 g/dL 9.1(L) 9.1(L) 9.6(L)  Hematocrit 36.0 - 46.0 % 28.0(L) 28.3(L) 30.2(L)  Platelets 150 - 400 K/uL 426(H) 454(H) 446(H)    CMP Latest Ref Rng & Units 06/02/2021 06/01/2021 06/01/2021  Glucose 70 - 99 mg/dL 126(H) - 91  BUN 8 - 23 mg/dL 7(L) - 11  Creatinine 0.44 - 1.00 mg/dL 0.59 - 0.80  Sodium  135 - 145 mmol/L 125(L) - 129(L)  Potassium 3.5 - 5.1 mmol/L 4.6 5.0 5.4(H)  Chloride 98 - 111 mmol/L 85(L) - 93(L)  CO2 22 - 32 mmol/L 32 - 31  Calcium 8.9 - 10.3 mg/dL 7.9(L) - 7.8(L)  Total Protein 6.5 - 8.1 g/dL - - -  Total Bilirubin 0.3 - 1.2 mg/dL - - -  Alkaline Phos 38 - 126 U/L - - -  AST 15 - 41 U/L - - -  ALT 0 - 44 U/L - - -     ASSESSMENT/PLAN:   Assessment: Principal Problem:   Aspiration pneumonia (HCC) Active Problems:   Venous insufficiency of both lower extremities   Atrial fibrillation with RVR (HCC)   Protein-calorie malnutrition, severe   CAP (community acquired pneumonia)   Plan:  Hyponatremia: Sodium decreased to 125 this morning. Baseline 128-130. Decrease likely due to poor PO intake over the last day given nausea and lack of appetite. We will correct and monitor.  - IV Normal Saline - BMP q4; goal Sodium 129  Aspiration Pneumonia  (Resolved): 6 Days of antibiotic therapy with IV Ampicillin-Sulbactam completed yesterday. Saturating appropriately on baseline 3L [place]. Cough present but markedly improved from admission. - Aspiration precautions  Atrial Fibrillation with RVR (Resolved) Remains in NSR. Continues to have nausea despite amiodarone dosage decrease to 200 mg daily, improved with Zofran. K+ down 5.4->5.0.  - Oral Amiodarone 200 mg daily -Continue Xarelto 15 mg daily  -Cardiology following; appreciate reccs. - Morning BMP and Mg; goal K+>4 and goal Mg> 2; replete PRN    COPD Exacerbation (Resolved) Continues to saturate appropriately on home oxygen 3L nasal canula.   - Continue home COPD medications (Yupelri, Brovana, Pulmicort) - Continue Duonebs q4 hours PRN - Incentive spirometry/flutter valve   Hypotension (Resolved): Patient  continues to remain normotensive over the last 24 hours. - Routine vitals - meal encouragement  Bilateral lower extremity venous stasis ulcers 2/2 Chronic venous insufficiency Continue daily dressing changes.  Best Practice: Diet: Regular diet IVF: none VTE: Rivaroxaban (XARELTO) tablet 15 mg  Code: DNR PT/OT: Home health PT Family Contact: Notified DISPO: Home PT/Palliative with sister  Ok Edwards, [place] Pager: [phone] 06/02/2021,1:53 PM

## 2021-06-02 NOTE — Progress Notes (Signed)
Physical Therapy Treatment Patient Details Name: Kristen [name] MRN: [id number] DOB: 08-17-1946 Today's Date: 06/02/2021    History of Present Illness 75 yo admitted 7/5 with SOB with AFib with RVR. Attempted cardioversion x 3 unsuccessful. PMHx: COPD on home O2, chronic Afib, cellulitis, hyponatremia,Chronic venous stasis dermatitis    PT Comments    Continuing work on functional mobility and activity tolerance;  Pt quite nauseated upon PT arrival (had nausea meds only jsut before we got there), and worried about getting up and walking while nauseated; We discussed PT POC while setting up and giving her a minute to see about her nausea settling down; Ultimately, she was too nervous about feeling ill, and opted to get on the [facility]; min ugard assist to come to sit EOB, stand up , and take pivotal steps bed to [facility]; NT in to assist her with toieting at end of PT session   Follow Up Recommendations  Home health PT     Equipment Recommendations  None recommended by PT    Recommendations for Other Services       Precautions / Restrictions Precautions Precautions: Fall;Other (comment) Precaution Comments: watch HR    Mobility  Bed Mobility Overal bed mobility: Needs Assistance Bed Mobility: Supine to Sit     Supine to sit: Min guard;HOB elevated     General bed mobility comments: minguard for safety and line management    Transfers Overall transfer level: Needs assistance Equipment used: 1 person hand held assist Transfers: Sit to/from Stand Sit to Stand: Min guard         General transfer comment: guarding with good hand placement from surface.  Ambulation/Gait Ambulation/Gait assistance: Min guard Gait Distance (Feet):  (pivotal steps bed to [facility]) Assistive device: 1 person hand held assist       General Gait Details: Pt very concerned about nausea once we got up; opted to take a few steps to [facility], instead of walking   Stairs             Wheelchair Mobility     Modified Rankin (Stroke Patients Only)       Balance     Sitting balance-Leahy Scale: Fair       Standing balance-Leahy Scale: Poor (approaching Fair)                              Cognition Arousal/Alertness: Awake/alert Behavior During Therapy: WFL for tasks assessed/performed;Anxious Overall Cognitive Status: Within Functional Limits for tasks assessed                                        Exercises      General Comments General comments (skin integrity, edema, etc.): VSS on RA      Pertinent Vitals/Pain Pain Assessment: No/denies pain    Home Living                      Prior Function            PT Goals (current goals can now be found in the care plan section) Acute Rehab PT Goals Patient Stated Goal: for nausea to feel better PT Goal Formulation: With patient Time For Goal Achievement: 06/12/21 Potential to Achieve Goals: Fair Progress towards PT goals: Progressing toward goals (slowly; limited by nausea this session)    Frequency  Min 3X/week      PT Plan Current plan remains appropriate    Co-evaluation              AM-PAC PT "6 Clicks" Mobility   Outcome Measure  Help needed turning from your back to your side while in a flat bed without using bedrails?: A Little Help needed moving from lying on your back to sitting on the side of a flat bed without using bedrails?: A Little Help needed moving to and from a bed to a chair (including a wheelchair)?: A Little Help needed standing up from a chair using your arms (e.g., wheelchair or bedside chair)?: A Little Help needed to walk in hospital room?: A Little Help needed climbing 3-5 steps with a railing? : A Lot 6 Click Score: 17    End of Session Equipment Utilized During Treatment: Oxygen Activity Tolerance: Other (comment) (limited by nausea) Patient left: with nursing/sitter in room;Other (comment) (on [facility]) Nurse Communication: Mobility  status PT Visit Diagnosis: Difficulty in walking, not elsewhere classified (R26.2);Other abnormalities of gait and mobility (R26.89);Muscle weakness (generalized) (M62.81)     Time: 4388-8757 PT Time Calculation (min) (ACUTE ONLY): 30 min  Charges:  $Therapeutic Activity: 23-37 mins                     Roney Marion, [place]  Acute Rehabilitation Services Pager [phone] Office [place] 06/02/2021, 3:57 PM

## 2021-06-03 ENCOUNTER — Telehealth: Payer: Self-pay | Admitting: *Deleted

## 2021-06-03 DIAGNOSIS — J690 Pneumonitis due to inhalation of food and vomit: Secondary | ICD-10-CM | POA: Diagnosis not present

## 2021-06-03 DIAGNOSIS — I4891 Unspecified atrial fibrillation: Secondary | ICD-10-CM | POA: Diagnosis not present

## 2021-06-03 LAB — BASIC METABOLIC PANEL
Anion gap: 4 — ABNORMAL LOW (ref 5–15)
Anion gap: 5 (ref 5–15)
BUN: 9 mg/dL (ref 8–23)
BUN: 9 mg/dL (ref 8–23)
CO2: 33 mmol/L — ABNORMAL HIGH (ref 22–32)
CO2: 33 mmol/L — ABNORMAL HIGH (ref 22–32)
Calcium: 7.8 mg/dL — ABNORMAL LOW (ref 8.9–10.3)
Calcium: 8 mg/dL — ABNORMAL LOW (ref 8.9–10.3)
Chloride: 89 mmol/L — ABNORMAL LOW (ref 98–111)
Chloride: 88 mmol/L — ABNORMAL LOW (ref 98–111)
Creatinine, Ser: 0.69 mg/dL (ref 0.44–1.00)
Creatinine, Ser: 0.64 mg/dL (ref 0.44–1.00)
GFR, Estimated: >60 mL/min (ref >60)
GFR, Estimated: >60 mL/min (ref >60)
Glucose, Bld: 90 mg/dL (ref 70–99)
Glucose, Bld: 89 mg/dL (ref 70–99)
Potassium: 5 mmol/L (ref 3.5–5.1)
Potassium: 5 mmol/L (ref 3.5–5.1)
Sodium: 126 mmol/L — ABNORMAL LOW (ref 135–145)
Sodium: 126 mmol/L — ABNORMAL LOW (ref 135–145)

## 2021-06-03 LAB — TSH: TSH: 5.906 u[IU]/mL — ABNORMAL HIGH (ref 0.350–4.500)

## 2021-06-03 MED ORDER — DOCUSATE SODIUM 100 MG PO CAPS: 100.0000 mg | ORAL_CAPSULE | Freq: Two times a day (BID) | ORAL | 0 refills | Status: AC | PRN

## 2021-06-03 MED ORDER — SODIUM CHLORIDE 0.9 % IV SOLN: INTRAVENOUS | Status: DC

## 2021-06-03 MED ORDER — ONDANSETRON HCL 4 MG PO TABS: 4.0000 mg | ORAL_TABLET | Freq: Four times a day (QID) | ORAL | 0 refills | Status: DC | PRN

## 2021-06-03 MED ORDER — ONDANSETRON HCL 4 MG/2ML IJ SOLN
4.0000 mg | Freq: Once | INTRAMUSCULAR | Status: AC
  Administered 2021-06-03: 4 mg via INTRAVENOUS
  Filled 2021-06-03: qty 2

## 2021-06-03 MED ORDER — AMIODARONE HCL 200 MG PO TABS: 200.0000 mg | ORAL_TABLET | Freq: Every day | ORAL | 0 refills | Status: DC

## 2021-06-03 NOTE — Telephone Encounter (Signed)
-----  Message from [facility] sent at 06/03/2021  8:01 AM EDT ----- Regarding: RE: appointment She has an appt on 07/26 with Dr. Johney Frame, do we need to cancel that appointment? ----- Message ----- From: Nuala Alpha, LPN Sent: [phone]/41/[phone]   9:48 PM EDT To: Gwendel Hanson, Mila Homer, # Subject: FW: appointment                                Can we call this pt and get her in for an appt per Kathyrn Drown, and let me know the date?  Thanks for all you do, Crewe Heathman   ----- Message ----- From: Loren Racer, RN Sent: 06/02/2021  10:18 AM EDT To: Nuala Alpha, LPN Subject: FW: appointment                                I called to get the pt scheduled and she is still hospitalized.  I told whoever answered the phone that we would call back when she is released.   ----- Message ----- From: Tommie Raymond, NP Sent: 06/01/2021  10:48 AM EDT To: Rebeca Alert [facility] Triage Subject: appointment                                    Please schedule this patient for a follow up with Dr. Johney Frame or APP in 4-6 weeks and call the patient to inform her of the date and time of appointment  Thank you  Sharee Pimple

## 2021-06-03 NOTE — Progress Notes (Signed)
Pt declined wound care.

## 2021-06-03 NOTE — Progress Notes (Addendum)
Progress Note  Patient Name: Kristen [name] Date of Encounter: 06/03/2021  [facility] Cardiologist: Freada Bergeron, MD   Subjective   Patient states she is feeling better, less nausea today, ate her breakfast. She denied chest pain or heart palpitation. She c/o left arm tenderness and swelling.   Inpatient Medications    Scheduled Meds:  (feeding supplement) PROSource Plus  30 mL Oral BID BM   amiodarone  200 mg Oral Daily   arformoterol  15 mcg Nebulization BID   budesonide (PULMICORT) nebulizer solution  0.5 mg Nebulization BID   famotidine  20 mg Oral Daily   feeding supplement  1 Container Oral BID BM   multivitamin with minerals  1 tablet Oral Daily   revefenacin  175 mcg Nebulization Daily   Rivaroxaban  15 mg Oral Q supper   Continuous Infusions:  sodium chloride 10 mL/hr at 05/30/21 1214   sodium chloride     PRN Meds: sodium chloride, acetaminophen, docusate sodium, ipratropium-albuterol, morphine injection, ondansetron, oxyCODONE-acetaminophen, polyethylene glycol   Vital Signs    Vitals:   06/02/21 1408 06/02/21 2008 06/02/21 2136 06/03/21 0523  BP: 106/64  124/67 (!) 144/72  Pulse: 64   60  Resp: _0 Temp: 98.2 F (36.8 C)  97.9 F (36.6 C) 97.9 F (36.6 C)  TempSrc: Oral  Oral Oral  SpO2: 96% 96% 95% 96%  Weight:    48.7 kg  Height:        Intake/Output Summary (Last 24 hours) at 06/03/2021 0726 Last data filed at 06/03/2021 0523 Gross per 24 hour  Intake 1820.79 ml  Output 2500 ml  Net -679.21 ml    Last 3 Weights 06/03/2021 06/02/2021 06/01/2021  Weight (lbs) 107 lb 5.8 oz 98 lb 12.8 oz 108 lb  Weight (kg) 48.7 kg 44.815 kg 48.988 kg      Telemetry    Sinus rhythm with ventricular rate of 60s - Personally Reviewed  ECG    N/A this AM - Personally Reviewed  Physical Exam   GEN: No acute distress. Cachexia.  Neck: No JVD Cardiac: RRR, no murmurs, rubs, or gallops.  Respiratory: Scattered wheezing. On 3L[place]. Speaks in  full sentence.  GI: Soft, nontender, non-distended  MS: BLE with ACE warp.  Neuro:  Nonfocal  Psych: Normal affect   Labs    High Sensitivity Troponin:   Recent Labs  Lab 05/27/21 1220 05/27/21 1519  TROPONINIHS 18* 51*       Chemistry Recent Labs  Lab 05/27/21 1220 05/28/21 0437 05/30/21 0736 05/31/21 0120 06/02/21 1916 06/03/21 0114 06/03/21 0552  NA 128*   < > 129*   < > 127* 126* 126*  K 4.4   < > 4.0   < > 4.8 5.0 5.0  CL 89*   < > 94*   < > 87* 89* 88*  CO2 27   < > 30   < > 36* 33* 33*  GLUCOSE 130*   < > 114*   < > 106* 90 89  BUN 12   < > 9   < > _1 CREATININE 0.81   < > 0.68   < > 0.75 0.69 0.64  CALCIUM 8.3*   < > 8.1*   < > 7.9* 7.8* 8.0*  PROT 5.4*  --  4.7*  --   --   --   --   ALBUMIN 1.9*  --  1.6*  --   --   --   --  AST 17  --  17  --   --   --   --   ALT 8  --  9  --   --   --   --   ALKPHOS 124  --  72  --   --   --   --   BILITOT 1.1  --  0.3  --   --   --   --   GFRNONAA >60   < > >60   < > >60 >60 >60  ANIONGAP 12   < > 5   < > 4* 4* 5   < > = values in this interval not displayed.      Hematology Recent Labs  Lab 05/30/21 0736 05/31/21 0120 06/01/21 0136  WBC 5.9 8.0 7.7  RBC 3.32* 3.16* 3.08*  HGB 9.6* 9.1* 9.1*  HCT 30.2* 28.3* 28.0*  MCV 91.0 89.6 90.9  MCH 28.9 28.8 29.5  MCHC 31.8 32.2 32.5  RDW 13.5 13.4 13.7  PLT 446* 454* 426*     BNP Recent Labs  Lab 05/27/21 1220  BNP 526.6*      DDimer No results for input(s): DDIMER in the last 168 hours.   Radiology    No results found.  Cardiac Studies   Echo from 10/23/2020:   1. Left ventricular ejection fraction, by estimation, is 55 to 60%. The  left ventricle has normal function. The left ventricle has no regional  wall motion abnormalities. Left ventricular diastolic parameters are  consistent with Grade I diastolic dysfunction (impaired relaxation). There is the interventricular septum is flattened in diastole ('D' shaped left ventricle), consistent  with right ventricular volume overload.   2. Right ventricular systolic function is low normal. The right  ventricular size is normal.   3. The mitral valve is grossly normal. No evidence of mitral valve  regurgitation.   4. The aortic valve is tricuspid. There is mild calcification of the  aortic valve. There is mild thickening of the aortic valve. Aortic valve  regurgitation is not visualized.   5. The inferior vena cava is normal in size with greater than 50%  respiratory variability, suggesting right atrial pressure of 3 mmHg.   Patient Profile     Kayli Beal is a 75 y.o. female with a PMH of paroxysmal atrial fibrillation diagnosed in January 2022 on Xarelto, tobacco abuse, oxygen dependent COPD, bilateral ankle wound, chronic venous stasis dermatitis, and chronic hyponatremia, admitted for aspiration pneumonia. Cardiology is following since 05/27/2021 for atrial fibrillation with RVR.   Assessment & Plan    Paroxysmal A fib with RVR - Presented with SOB and hypotension, admitted for sepsis 2/2 aspiration pneumonia  - Attempted DCCV x3 in the ER without success and subsequently placed on amiodarone gtt, not tolerate metoprolol due to hypotension, initially converted to NSR on 05/28/21 but had recurrence of A fib RVR after requiring amiodarone re-bolus, currently remained in NSR and transitioned to PO amiodarone loading 457m BID x7 days and 2076mdaily after; due to nausea, amiodarone reduced to 20092maily, tolerating  - CHADS2VASC score 4, continued anticoagulation on Xarelto 60m54mily  - follow up with cardiology outpatient arranged on 06/17/21, patient agreeable   Sepsis 2/2 Aspiration pneumonia - completed full course Unasyn  - managed per IM  Hyponatremia: 126 today Nausea: improving  COPD exacerbation BLE venous stasis ulcers   Chronic venous insufficiency Anemia  - managed per IM   For questions or updates, please contact [facility] Please  consult [email or web address] for  contact info under    Signed, Margie Billet, NP  06/03/2021, 7:26 AM    History and all data above reviewed.  Patient examined.  I agree with the findings as above.  She is a little weak but she has been able to ambulate.  Anxious to go home.  No pain or SOB.  The patient exam reveals COR:RRR  ,  Lungs: Clear  ,  Abd: Positive bowel sounds, no rebound no guarding, Ext No edema  .  All available labs, radiology testing, previous records reviewed.   Agree with documented assessment and plan.    Atrial fib:  Maintaining NSR.  OK to send home with meds and amiodarone taper as above.  We have arranged follow up.  Hyponatremia:  Restrict free water.  Follow closely as an out patient by PCP.    Jeneen Rinks Dejohn Ibarra  10:44 AM  06/03/2021

## 2021-06-03 NOTE — Progress Notes (Signed)
Physical Therapy Treatment Patient Details Name: Kristen [name] MRN: [id number] DOB: 1946-06-19 Today's Date: 06/03/2021    History of Present Illness 75 yo admitted 7/5 with SOB with AFib with RVR. Attempted cardioversion x 3 unsuccessful. PMHx: COPD on home O2, chronic Afib, cellulitis, hyponatremia,Chronic venous stasis dermatitis    PT Comments    Pt remains weak and is slow to progress with mobility. She is still requiring hands on assist for all mobility and fatigues very quickly and needs frequent rest breaks. Updated recommendations to SNF. Explained recommendation to pt and pt was agreeable. If pt/family decide against SNF would recommend she have assist with all mobility.    Follow Up Recommendations  SNF     Equipment Recommendations  None recommended by PT    Recommendations for Other Services       Precautions / Restrictions Precautions Precautions: Fall;Other (comment) Precaution Comments: watch HR    Mobility  Bed Mobility Overal bed mobility: Needs Assistance Bed Mobility: Supine to Sit;Sit to Supine     Supine to sit: Min guard;HOB elevated Sit to supine: Min guard;HOB elevated   General bed mobility comments: Assist for safety and lines    Transfers Overall transfer level: Needs assistance Equipment used: 4-wheeled walker;1 person hand held assist Transfers: Sit to/from [facility] Sit to Stand: Min assist Stand pivot transfers: Min assist       General transfer comment: Attempted to have pt stand from bed and pivot to bsc without hands on assist but pt unable to keep balance and plopped back down on the bed. On second attempt stood with min assist of hand held and pivoted with min assist for balance. Stood from commode with rollator with min assist for balance  Ambulation/Gait Ambulation/Gait assistance: Min Web designer (Feet): 20 Feet Assistive device: 4-wheeled walker Gait Pattern/deviations: Step-through  pattern;Decreased step length - right;Decreased step length - left;Shuffle;Trunk flexed Gait velocity: decr Gait velocity interpretation: <1.8 ft/sec, indicate of risk for recurrent falls General Gait Details: assist for balance and to manage rollator   Stairs             Wheelchair Mobility    Modified Rankin (Stroke Patients Only)       Balance Overall balance assessment: Needs assistance Sitting-balance support: No upper extremity supported;Feet supported Sitting balance-Leahy Scale: Fair     Standing balance support: Single extremity supported;Bilateral upper extremity supported Standing balance-Leahy Scale: Poor Standing balance comment: UE support and min guard for static standing                            Cognition Arousal/Alertness: Awake/alert Behavior During Therapy: Anxious Overall Cognitive Status: Within Functional Limits for tasks assessed                                        Exercises      General Comments General comments (skin integrity, edema, etc.): Pt on 3L of O2 with VSS. Dyspnea 2-3/4      Pertinent Vitals/Pain      Home Living                      Prior Function            PT Goals (current goals can now be found in the care plan section) Acute Rehab PT Goals Patient Stated Goal: not  stated Progress towards PT goals: Progressing toward goals    Frequency    Min 3X/week      PT Plan Discharge plan needs to be updated    Co-evaluation              AM-PAC PT "6 Clicks" Mobility   Outcome Measure  Help needed turning from your back to your side while in a flat bed without using bedrails?: A Little Help needed moving from lying on your back to sitting on the side of a flat bed without using bedrails?: A Little Help needed moving to and from a bed to a chair (including a wheelchair)?: A Little Help needed standing up from a chair using your arms (e.g., wheelchair or bedside  chair)?: A Little Help needed to walk in hospital room?: A Little Help needed climbing 3-5 steps with a railing? : A Lot 6 Click Score: 17    End of Session Equipment Utilized During Treatment: Oxygen Activity Tolerance: Patient limited by fatigue Patient left: in bed;with call bell/phone within reach;with bed alarm set Nurse Communication: Mobility status PT Visit Diagnosis: Difficulty in walking, not elsewhere classified (R26.2);Other abnormalities of gait and mobility (R26.89);Muscle weakness (generalized) (M62.81)     Time: 1640-1700 PT Time Calculation (min) (ACUTE ONLY): 20 min  Charges:  $Gait Training: 8-22 mins                     [place] Pager [phone] Office [place] 06/03/2021, 5:20 PM

## 2021-06-04 DIAGNOSIS — J690 Pneumonitis due to inhalation of food and vomit: Secondary | ICD-10-CM | POA: Diagnosis not present

## 2021-06-04 MED ORDER — ONDANSETRON HCL 4 MG/2ML IJ SOLN
4.0000 mg | Freq: Once | INTRAMUSCULAR | Status: AC
  Administered 2021-06-04: 4 mg via INTRAVENOUS
  Filled 2021-06-04: qty 2

## 2021-06-04 NOTE — Progress Notes (Addendum)
HD#8 Subjective:  Overnight Events: no acute events overnight   Patient was medically stable for discharge. Prior to discharge, family stated they did not feel comfortable taking her home due to mobility concerns. PT was called to reevaluate patient and recommended SNF.   Objective:  Vital signs in last 24 hours: Vitals:   06/03/21 2017 06/03/21 2111 06/03/21 2359 06/04/21 0326  BP: 113/68  122/64 112/62  Pulse: 69  68 60  Resp: _0 Temp: 98.2 F (36.8 C)  97.9 F (36.6 C) 98 F (36.7 C)  TempSrc: Oral  Oral Oral  SpO2: 95% 97% 97% 99%  Weight:    46.1 kg  Height:       Supplemental O2: Room Air SpO2: 99 % O2 Flow Rate (L/min): 3 L/min   Physical Exam:  Constitutional: Weak-appearing, cachetic female sitting up in bed, in no acute distress Cardiovascular: regular rate and rhythm, no pitting edema BLEs Pulmonary/Chest: normal work of breathing on 3L [place] MSK: poor bulk and tone, slight edema surrounding right elbow Neurological: alert & oriented x 3, answers questions appropriately  Skin: warm and dry Psych: affect mood congruent   Filed Weights   06/02/21 0500 06/03/21 0523 06/04/21 0326  Weight: 44.8 kg 48.7 kg 46.1 kg     Intake/Output Summary (Last 24 hours) at 06/04/2021 0647 Last data filed at 06/04/2021 6837 Gross per 24 hour  Intake 625.29 ml  Output 2225 ml  Net -1599.71 ml   Net IO Since Admission: -1,053.18 mL [06/04/21 0647]  Pertinent Labs: CBC Latest Ref Rng & Units 06/01/2021 05/31/2021 05/30/2021  WBC 4.0 - 10.5 K/uL 7.7 8.0 5.9  Hemoglobin 12.0 - 15.0 g/dL 9.1(L) 9.1(L) 9.6(L)  Hematocrit 36.0 - 46.0 % 28.0(L) 28.3(L) 30.2(L)  Platelets 150 - 400 K/uL 426(H) 454(H) 446(H)    CMP Latest Ref Rng & Units 06/03/2021 06/03/2021 06/02/2021  Glucose 70 - 99 mg/dL 89 90 106(H)  BUN 8 - 23 mg/dL _1 Creatinine 0.44 - 1.00 mg/dL 0.64 0.69 0.75  Sodium 135 - 145 mmol/L 126(L) 126(L) 127(L)  Potassium 3.5 - 5.1 mmol/L 5.0 5.0 4.8  Chloride 98 -  111 mmol/L 88(L) 89(L) 87(L)  CO2 22 - 32 mmol/L 33(H) 33(H) 36(H)  Calcium 8.9 - 10.3 mg/dL 8.0(L) 7.8(L) 7.9(L)  Total Protein 6.5 - 8.1 g/dL - - -  Total Bilirubin 0.3 - 1.2 mg/dL - - -  Alkaline Phos 38 - 126 U/L - - -  AST 15 - 41 U/L - - -  ALT 0 - 44 U/L - - -    Imaging: No results found.  Assessment/Plan:   Principal Problem:   Aspiration pneumonia ([place]) Active Problems:   Venous insufficiency of both lower extremities   Atrial fibrillation with RVR (HCC)   Protein-calorie malnutrition, severe   CAP (community acquired pneumonia)   Patient Summary: Kristen [name] is a 75 y.o. with a pertinent PMH of PNA and COPD on home O2, who presented with worsening shortness of breath and chest tightness and admitted for afib with RVR in HR 150s.    Aspiration Pneumonia (Improving): She has completed a 6 day course of antibiotic therapy.  Saturates well on home 3L [place], with faint wheezes heard bilaterally. Reports much improved with reduced coughing fits. - aspiration precauction   COPD Exacerbation Breathing comfortably on on home 3 L, but still has some wheezing on examination.  - home COPD medications - duonebs - incentive spirometry  Atrial Fibrillation with RVR: Appears to have converted at bedside this morning to normal sinus rhythm.  We will transition off of amiodarone infusion to oral medications, per cardiology.  She does not tolerate rate control medications due to hypotensive episodes. - cardiology following, appreciate recs - oral amio 200 - oral xarelto - replete Mg and K prn   Hypotension: Patient continues to have asymptomatic intermittent systolic Bps in the 17R in the setting of  chronic hyponatremia.  ACTH stimulation test today ruled out adrenal insufficiency. Likely related to recent infection and deconditioning/malnutrition. - routine vitals - PO intake   Hyponatremia: Sodium 126, with a baseline 128-130. Decreased likely due to poor PO intake over  the last day given nausea and lack of appetite.  - IV NS   Bilateral lower extremity venous stasis ulcers 2/2 Chronic venous insufficiency  Diet: Regular diet IVF: none VTE: Rivaroxaban (XARELTO) tablet 15 mg  Code: DNR PT/OT: Home health PT Family Contact: Notified DISPO: Home PT/Palliative with sister   Dispo: Anticipated discharge to Skilled nursing facility in 1 days pending SNF placement.   Delene Ruffini, MD Internal Medicine Resident PGY-1 Pager [phone] Please contact the on call pager after 5 pm and on weekends at [phone].

## 2021-06-04 NOTE — Progress Notes (Addendum)
HD#8 SUBJECTIVE:  Overnight Events: no acute events overnight  Patient is medically stable for discharge as of yesterday. However family expressed discomfort with bringing patient home. PT reevaluated yesterday and recommended SNF placement. She reports feeling more drowsy than usual this morning. She has not gotten out of bed much over the last day and is amenable to eating her meals in her chair.   OBJECTIVE:   Vital Signs: Vitals:   06/03/21 2111 06/03/21 2359 06/04/21 0326 06/04/21 0751  BP:  122/64 112/62 (!) 100/59  Pulse:  68 60 66  Resp:  _0 Temp:  97.9 F (36.6 C) 98 F (36.7 C) 97.9 F (36.6 C)  TempSrc:  Oral Oral Oral  SpO2: 97% 97% 99% 97%  Weight:   46.1 kg   Height:       Supplemental O2: Nasal [place] SpO2: 97 % O2 Flow Rate (L/min): 3 L/min  Filed Weights   06/02/21 0500 06/03/21 0523 06/04/21 0326  Weight: 44.8 kg 48.7 kg 46.1 kg     Intake/Output Summary (Last 24 hours) at 06/04/2021 1326 Last data filed at 06/04/2021 0829 Gross per 24 hour  Intake 720 ml  Output 1425 ml  Net -705 ml   Net IO Since Admission: -693.18 mL [06/04/21 1326]  BP 127/69 (BP Location: Right Arm)   Pulse (!) 57   Temp 97.9 F (36.6 C) (Oral)   Resp 16   Ht _1  (1.778 m)   Wt 49 kg   SpO2 96%   BMI 15.50 kg/m   Constitutional:tired-appearing, cachetic female resting, in no acute distress Cardiovascular: regular rate and rhythm Pulmonary/Chest: normal work of breathing on 3L [place] MSK: poor bulk and tone Neurological: alert & oriented x 3, answers questions appropriately  Psych: affect depressed, mood congruent  Patient Lines/Drains/Airways Status     Active Line/Drains/Airways     Name Placement date Placement time Site Days   Peripheral IV 05/27/21 20 G Left Antecubital 05/27/21  1225  Antecubital  2   Peripheral IV 05/27/21 20 G Right Antecubital 05/27/21  1310  Antecubital  2   Peripheral IV 05/27/21 20 G 1" Anterior;Left;Proximal;Upper Arm  05/27/21  1940  Arm  2   External Urinary Catheter 05/27/21  1707  --  2   Wound / Incision (Open or Dehisced) 02/24/21 Venous stasis ulcer Ankle Anterior;Right 02/24/21  1011  Ankle  94   Wound / Incision (Open or Dehisced) 05/27/21 Venous stasis ulcer Ankle Left;Medial 05/27/21  1707  Ankle  2            Labs: CBC Latest Ref Rng & Units 06/01/2021 05/31/2021 05/30/2021  WBC 4.0 - 10.5 K/uL 7.7 8.0 5.9  Hemoglobin 12.0 - 15.0 g/dL 9.1(L) 9.1(L) 9.6(L)  Hematocrit 36.0 - 46.0 % 28.0(L) 28.3(L) 30.2(L)  Platelets 150 - 400 K/uL 426(H) 454(H) 446(H)    CMP Latest Ref Rng & Units 06/03/2021 06/03/2021 06/02/2021  Glucose 70 - 99 mg/dL 89 90 106(H)  BUN 8 - 23 mg/dL _2 Creatinine 0.44 - 1.00 mg/dL 0.64 0.69 0.75  Sodium 135 - 145 mmol/L 126(L) 126(L) 127(L)  Potassium 3.5 - 5.1 mmol/L 5.0 5.0 4.8  Chloride 98 - 111 mmol/L 88(L) 89(L) 87(L)  CO2 22 - 32 mmol/L 33(H) 33(H) 36(H)  Calcium 8.9 - 10.3 mg/dL 8.0(L) 7.8(L) 7.9(L)  Total Protein 6.5 - 8.1 g/dL - - -  Total Bilirubin 0.3 - 1.2 mg/dL - - -  Alkaline Phos 38 -  126 U/L - - -  AST 15 - 41 U/L - - -  ALT 0 - 44 U/L - - -     ASSESSMENT/PLAN:   Assessment: Principal Problem:   Aspiration pneumonia (HCC) Active Problems:   Venous insufficiency of both lower extremities   Atrial fibrillation with RVR (HCC)   Protein-calorie malnutrition, severe   CAP (community acquired pneumonia)   Plan:  Chronic Hyponatremia (Stable) Most recent Na stable at 126. Baseline 128-130. No concerns for mental status changes or seizures. Likely due to decreased PO intake over the last few days as she was having amiodarone-associated nausea. Improving with Zofran. - Free water restrict to 1267m daily - Zofran for Nausea - meal encouragement  Aspiration Pneumonia (Resolved): Completed 6 days of antibiotic therapy during this admission. Cough and breathing markedly improved since admission. Continues to saturate well on baseline 3L [place].  -  Aspiration precautions  Atrial Fibrillation with RVR (Resolved) Remains in NSR. Experiencing nausea likely secondary to amiodarone therapy, improves with Zofran. - Oral Amiodarone 200 mg daily -Continue Xarelto 15 mg daily  -Cardiology following; appreciate reccs   COPD Exacerbation (Resolved) Saturating appropriately on home oxygen 3L nasal [place]ula.   - Continue home COPD medications (Maretta Bees Brovana, Pulmicort) - Continue Duonebs q4 hours PRN - Incentive spirometry/flutter valve   Bilateral lower extremity venous stasis ulcers 2/2 Chronic venous insufficiency Continue daily dressing changes.  Best Practice: Diet: Regular diet IVF: none VTE: Rivaroxaban (XARELTO) tablet 15 mg  Code: DNR PT/OT: Home health PT Family Contact: Notified DISPO: Awaiting SNF placement/ Palliative  AOk Edwards MS4 Pager: [phone]/13/2022,1:26 PM

## 2021-06-04 NOTE — TOC Progression Note (Addendum)
Transition of Care [facility]) - Progression Note    Patient Details  Name: Kristen [name] MRN: [id number] Date of Birth: 1946-07-30  Transition of Care [facility]) CM/SW [place], [place] Phone Number: 06/04/2021, 4:36 PM  Clinical Narrative:     Update- CSW provided patient with SNF bed offers. Patient chose SNF placement at [facility]. CSW called Tressa Busman with [facility] who confirmed SNF bed for patient when medically ready. Patient has SN[facility] bed at [facility] Insurance authorization is pending. Patient sister Kristen [name] was updated. CSW will continue to follow.   CSW started insurance authorization for patient. Reference number is # [id number]. SNF choice pending. Insurance authorization pending. CSW will continue to follow and assist with dc planning needs.   Expected Discharge Plan: [place] Barriers to Discharge: Continued Medical Work up  Expected Discharge Plan and Services Expected Discharge Plan: [place] In-house Referral: Clinical Social Work Discharge Planning Services: CM Consult Post Acute Care Choice: [place] Living arrangements for the past 2 months: Single Family Home Expected Discharge Date: 06/03/21                 DME Agency: NA                   Social Determinants of Health ([place]) Interventions    Readmission Risk Interventions Readmission Risk Prevention Plan 06/02/2021 09/29/2020  Transportation Screening Complete Complete  PCP or Specialist Appt within 5-7 Days - Complete  Home Care Screening - Complete  Medication Review (RN CM) - Complete  Medication Review Press photographer) Complete -  PCP or Specialist appointment within 3-5 days of discharge Complete -  [place] or Home Care Consult Complete -  SW Recovery Care/Counseling Consult Complete -  Palliative Care Screening Complete -  [place] Complete -  Some recent data might be hidden

## 2021-06-04 NOTE — Progress Notes (Signed)
Occupational Therapy Treatment Patient Details Name: Kristen [name] MRN: [id number] DOB: March 04, 1946 Today's Date: 06/04/2021    History of present illness 75 yo admitted 7/5 with SOB with AFib with RVR. Attempted cardioversion x 3 unsuccessful. PMHx: COPD on home O2, chronic Afib, cellulitis, hyponatremia,Chronic venous stasis dermatitis   OT comments  Pt limited by fatigue this session, refusing to complete more than bed mobility for better positioning. Pt demonstrating increased UE weakness with lateral scoots in bed, requiring min A. Otherwise, pt bed mobility remains at min guard for safety and support for steadying. Acute OT will continue to follow up to assist with progressing pt goals.    Follow Up Recommendations  SNF    Equipment Recommendations  None recommended by OT    Recommendations for Other Services      Precautions / Restrictions Precautions Precautions: Fall;Other (comment) Precaution Comments: watch HR Restrictions Weight Bearing Restrictions: No       Mobility Bed Mobility Overal bed mobility: Needs Assistance Bed Mobility: Supine to Sit;Sit to Supine     Supine to sit: Min guard;HOB elevated Sit to supine: Min guard;HOB elevated   General bed mobility comments: Assist for safety and lines    Transfers Overall transfer level: Needs assistance Equipment used: None Transfers: Lateral/Scoot Transfers          Lateral/Scoot Transfers: Min assist General transfer comment: Pt lateral scooted up the bed for better positioning in bed, requiring min A due to weakness    Balance Overall balance assessment: Needs assistance Sitting-balance support: No upper extremity supported;Feet supported Sitting balance-Leahy Scale: Fair         Standing balance comment: deferred                           ADL either performed or assessed with clinical judgement   ADL Overall ADL's : Needs assistance/impaired Eating/Feeding: Independent;Sitting                                      General ADL Comments: Pt deferred all mobility other than bed mobility, refused grooming/hygiene, worried about being catheterized again.     Vision       Perception     Praxis      Cognition Arousal/Alertness: Awake/alert Behavior During Therapy: WFL for tasks assessed/performed Overall Cognitive Status: Within Functional Limits for tasks assessed                                          Exercises     Shoulder Instructions       General Comments      Pertinent Vitals/ Pain          Home Living                                          Prior Functioning/Environment              Frequency  Min 2X/week        Progress Toward Goals  OT Goals(current goals can now be found in the care plan section)  Progress towards OT goals: Not progressing toward goals - comment  Acute Rehab OT Goals Patient Stated  Goal: not stated OT Goal Formulation: With patient Time For Goal Achievement: 06/15/21 Potential to Achieve Goals: Good ADL Goals Pt Will Perform Grooming: with modified independence;standing Pt Will Transfer to Toilet: with modified independence;ambulating Pt Will Perform Toileting - Clothing Manipulation and hygiene: with modified independence;sitting/lateral leans;sit to/from stand Additional ADL Goal #1: Pt will report 3 energy conservation techniques she can use at home.  Plan Frequency remains appropriate;Discharge plan needs to be updated    Co-evaluation                 AM-PAC OT "6 Clicks" Daily Activity     Outcome Measure   Help from another person eating meals?: None Help from another person taking care of personal grooming?: A Little Help from another person toileting, which includes using toliet, bedpan, or urinal?: A Little Help from another person bathing (including washing, rinsing, drying)?: A Little Help from another person to put on and  taking off regular upper body clothing?: None Help from another person to put on and taking off regular lower body clothing?: A Little 6 Click Score: 20    End of Session Equipment Utilized During Treatment: Oxygen  OT Visit Diagnosis: Unsteadiness on feet (R26.81);Other abnormalities of gait and mobility (R26.89);Muscle weakness (generalized) (M62.81)   Activity Tolerance Patient limited by fatigue   Patient Left in bed;with call bell/phone within reach;with bed alarm set   Nurse Communication Other (comment) (Pt refusing OOB at this time)        Time: 2072-1828 OT Time Calculation (min): 10 min  Charges: OT General Charges $OT Visit: 1 Visit OT Treatments $Self Care/Home Management : 8-22 mins  Daquarius Dubeau H., OTR/L Acute Rehabilitation  Meli Faley Elane Yolanda Bonine 06/04/2021, 5:29 PM

## 2021-06-04 NOTE — TOC Initial Note (Signed)
Transition of Care [facility]) - Initial/Assessment Note    Patient Details  Name: Kristen [name] MRN: [id number] Date of Birth: 02/24/1946  Transition of Care [facility]) CM/SW Contact:    Trula Ore, [place] Phone Number: 06/04/2021, 3:50 PM  Clinical Narrative:                  CSW received consult for possible SNF placement at time of discharge. CSW spoke with patient at bedside regarding PT recommendation of SNF placement at time of discharge. Patient comes from home with sister. Patient expressed understanding of PT recommendation and is agreeable to SNF placement at time of discharge. Patient gave CSW permission to fax out initial referral near [place] area.CSW discussed insurance authorization process and provided Medicare SNF ratings list. Patient has received the COVID vaccines.Patient has not received booster. Patient reports it is not time. Patient reports she is waiting until November to receive it.  No further questions reported at this time. CSW to continue to follow and assist with discharge planning needs.  Expected Discharge Plan: Skilled Nursing Facility Barriers to Discharge: Continued Medical Work up   Patient Goals and CMS Choice Patient states their goals for this hospitalization and ongoing recovery are:: SNF CMS Medicare.gov Compare Post Acute Care list provided to:: Patient Choice offered to / list presented to : Patient  Expected Discharge Plan and Services Expected Discharge Plan: [place] In-house Referral: Clinical Social Work Discharge Planning Services: CM Consult Post Acute Care Choice: [place] Living arrangements for the past 2 months: Single Family Home Expected Discharge Date: 06/03/21                 DME Agency: NA                  Prior Living Arrangements/Services Living arrangements for the past 2 months: [place] Lives with:: Self, Siblings (lives with sister) Patient language and need for  interpreter reviewed:: Yes Do you feel safe going back to the place where you live?: No   SNF  Need for Family Participation in Patient Care: Yes (Comment) Care giver support system in place?: Yes (comment) Current home services: DME (Patient's sister has a rolling walker at home- no other DME.) Criminal Activity/Legal Involvement Pertinent to Current Situation/Hospitalization: No - Comment as needed  Activities of Daily Living Home Assistive Devices/Equipment: None ADL Screening (condition at time of admission) Patient's cognitive ability adequate to safely complete daily activities?: Yes Is the patient deaf or have difficulty hearing?: No Does the patient have difficulty seeing, even when wearing glasses/contacts?: No Does the patient have difficulty concentrating, remembering, or making decisions?: No Patient able to express need for assistance with ADLs?: Yes Does the patient have difficulty dressing or bathing?: No Independently performs ADLs?: Yes (appropriate for developmental age) Does the patient have difficulty walking or climbing stairs?: No Weakness of Legs: Both (patient states it's associated with the pain from her venous stasis uclers) Weakness of Arms/Hands: None  Permission Sought/Granted Permission sought to share information with : Case Manager, Family Supports, Chartered certified accountant granted to share information with : Yes, Verbal Permission Granted  Share Information with NAME: [name]  Permission granted to share info w AGENCY: SNF  Permission granted to share info w Relationship: sister  Permission granted to share info w Contact Information: [name] [phone]  Emotional Assessment Appearance:: Appears stated age Attitude/Demeanor/Rapport: Gracious Affect (typically observed): Calm Orientation: : Oriented to Self, Oriented to Place, Oriented to  Time,  Oriented to Situation Alcohol / Substance Use: Not Applicable Psych Involvement: No  (comment)  Admission diagnosis:  COPD (chronic obstructive pulmonary disease) ([place]) [J44.9] Atrial fibrillation with RVR (HCC) [I48.91] Hypotension, unspecified hypotension type [I95.9] Patient Active Problem List   Diagnosis Date Noted   Protein-calorie malnutrition, severe 05/29/2021   CAP (community acquired pneumonia) 05/29/2021   Septic shock ([place]) 05/29/2021   Aspiration pneumonia ([place]) 05/29/2021   Atrial fibrillation with RVR ([place]) 05/27/2021   Cellulitis of left lower extremity 05/06/2021   Aortic atherosclerosis ([place]) 03/07/2021   Opiate use 03/05/2021   Need for assessment by dentistry for poor dentition 03/05/2021   Abdominal pain 02/06/2021   Health care maintenance 12/27/2020   Paroxysmal atrial fibrillation with rapid ventricular response ([place]) 12/16/2020   Nausea 11/07/2020   Lower extremity ulceration ([place]) 10/04/2020   Generalized anxiety disorder 08/27/2020   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[place])    Smoking 07/18/2020   Venous insufficiency of both lower extremities 07/18/2020   COPD (chronic obstructive pulmonary disease) ([place]) 06/28/2020   Protein-calorie malnutrition ([place]) 06/28/2020   Hyponatremia 06/28/2020   PCP:  [name], MD Pharmacy:   [id number] [place] [place] [address]Phone: [phone]: [phone]    Social Determinants of Health (SDOH) Interventions    Readmission Risk Interventions Readmission Risk Prevention Plan 06/02/2021 09/29/2020  Transportation Screening Complete Complete  PCP or Specialist Appt within 5-7 Days - Complete  Home Care Screening - Complete  Medication Review (RN CM) - Complete  Medication Review ([place] Complete -  PCP or Specialist appointment within 3-5 days of discharge Complete -  [place]or Home Care Consult Complete -  SW Recovery Care/Counseling Consult Complete -  Palliative Care Screening Complete -  [place] -  Some recent data might be hidden

## 2021-06-04 NOTE — NC FL2 (Signed)
[place] LEVEL OF CARE SCREENING TOOL     IDENTIFICATION  Patient Name: Kristen [name] Birthdate: [birth year] Sex: female Admission Date (Current Location): 05/27/2021  [facility] and [place] Number:  Herbalist and Address:         Provider Number: [id number]  Attending Physician Name and Address:  Axel Filler, *  Relative Name and Phone Number:  [name] [phone]    Current Level of Care: Hospital Recommended Level of Care: [place] Prior Approval Number:    Date Approved/Denied:   PASRR Number: [id number]  Discharge Plan: SNF    Current Diagnoses: Patient Active Problem List   Diagnosis Date Noted   Protein-calorie malnutrition, severe 05/29/2021   CAP (community acquired pneumonia) 05/29/2021   Septic shock ([place]) 05/29/2021   Aspiration pneumonia ([place]) 05/29/2021   Atrial fibrillation with RVR ([place]) 05/27/2021   Cellulitis of left lower extremity 05/06/2021   Aortic atherosclerosis ([place]) 03/07/2021   Opiate use 03/05/2021   Need for assessment by dentistry for poor dentition 03/05/2021   Abdominal pain 02/06/2021   Health care maintenance 12/27/2020   Paroxysmal atrial fibrillation with rapid ventricular response ([place]) 12/16/2020   Nausea 11/07/2020   Lower extremity ulceration ([place]) 10/04/2020   Generalized anxiety disorder 08/27/2020   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[place])    Smoking 07/18/2020   Venous insufficiency of both lower extremities 07/18/2020   COPD (chronic obstructive pulmonary disease) ([place]) 06/28/2020   Protein-calorie malnutrition ([place]) 06/28/2020   Hyponatremia 06/28/2020    Orientation RESPIRATION BLADDER Height & Weight     Self, Time, Situation, Place  O2 (3L [place]) Continent, External catheter Weight: 101 lb 10.1 oz (46.1 kg) Height:  _0  (177.8 cm)  BEHAVIORAL SYMPTOMS/MOOD NEUROLOGICAL BOWEL NUTRITION STATUS      Continent Diet (See DC summary)  AMBULATORY STATUS  COMMUNICATION OF NEEDS Skin   Limited Assist Verbally Surgical wounds (Open wound 05/27/21 venous stasis ulcer left ankle medial)                       Personal Care Assistance Level of Assistance  Bathing, Dressing, Feeding Bathing Assistance: Limited assistance Feeding assistance: Independent Dressing Assistance: Limited assistance     Functional Limitations Info  Sight, Hearing, Speech Sight Info: Adequate Hearing Info: Adequate Speech Info: Adequate    SPECIAL CARE FACTORS FREQUENCY  OT (By licensed OT), PT (By licensed PT)     PT Frequency: 5x week OT Frequency: 5x week            Contractures Contractures Info: Not present    Additional Factors Info  Code Status, Allergies Code Status Info: DNR Allergies Info: Gabapentin           Current Medications (06/04/2021):  This is the current hospital active medication list Current Facility-Administered Medications  Medication Dose Route Frequency Provider Last Rate Last Admin   0.9 %  sodium chloride infusion   Intravenous PRN Jacky Kindle, MD 10 mL/hr at 05/30/21 1214 Rate Change at 05/30/21 1214   acetaminophen (TYLENOL) tablet 650 mg  650 mg Oral Q6H PRN Freada Bergeron, MD       amiodarone (PACERONE) tablet 200 mg  200 mg Oral Daily Nahser, Wonda Cheng, MD   200 mg at 06/04/21 1011   arformoterol (BROVANA) nebulizer solution 15 mcg  15 mcg Nebulization BID Mick Sell, PA-C   15 mcg at 06/04/21 0747   budesonide (PULMICORT) nebulizer solution 0.5 mg  0.5 mg Nebulization BID Jennelle Human B, NP   0.5 mg at 06/04/21 0747   docusate sodium (COLACE) capsule 100 mg  100 mg Oral BID PRN Mick Sell, PA-C       famotidine (PEPCID) tablet 20 mg  20 mg Oral Daily Mick Sell, PA-C   20 mg at 06/04/21 1011   ipratropium-albuterol (DUONEB) 0.5-2.5 (3) MG/3ML nebulizer solution 3 mL  3 mL Nebulization Q4H PRN Andres Labrum D, PA-C   3 mL at 05/31/21 1937   morphine 2 MG/ML injection 1 mg  1 mg Intravenous Q6H PRN  Dellinger, Bobby Rumpf, PA-C       multivitamin with minerals tablet 1 tablet  1 tablet Oral Daily Simonne Maffucci B, MD   1 tablet at 06/04/21 1011   ondansetron (ZOFRAN) tablet 4 mg  4 mg Oral Q6H PRN Mitzi Hansen, MD   4 mg at 06/04/21 0904   oxyCODONE-acetaminophen (PERCOCET) 7.5-325 MG per tablet 1 tablet  1 tablet Oral Q6H PRN Freada Bergeron, MD   1 tablet at 06/04/21 1250   polyethylene glycol (MIRALAX / GLYCOLAX) packet 17 g  17 g Oral Daily PRN Mick Sell, PA-C       revefenacin (YUPELRI) nebulizer solution 175 mcg  175 mcg Nebulization Daily Mick Sell, PA-C   175 mcg at 06/03/21 1735   Rivaroxaban (XARELTO) tablet 15 mg  15 mg Oral Q supper Mick Sell, PA-C   15 mg at 06/03/21 1652     Discharge Medications: Please see discharge summary for a list of discharge medications.  Relevant Imaging Results:  Relevant Lab Results:   Additional Information    Debrina Kizer B Geron Mulford, LCSWA

## 2021-06-04 NOTE — Progress Notes (Signed)
Nutrition Follow-up  DOCUMENTATION CODES:   Underweight, Severe malnutrition in context of chronic illness  INTERVENTION:   D/C Boost Breeze. D/C Prosource Plus. Continue MVI with minerals daily. Add Magic cup TID with meals, each supplement provides 290 kcal and 9 grams of protein.  NUTRITION DIAGNOSIS:   Severe Malnutrition related to chronic illness (COPD/CHF) as evidenced by severe fat depletion, severe muscle depletion, energy intake < or equal to 75% for > or equal to 1 month.  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 Admitted several times in the last year for COPD exacerbation. Presents this admission with afib RVR.  Patient reports she's feeling okay, but tired. She thinks she has been eating well. Does not like the Boost Breeze or Prosource Plus supplements. Will try magic cups with meals.   Meal intakes: 75-100% Patient is refusing the Boost Breeze and Prosource Plus supplements ordered BID, will D/C.  Admission weight 41.6 kg Current weight 46.1 kg Increase in weight r/t fluid retention with CHF.  Diet Order:   Diet Order             Diet - low sodium heart healthy           Diet regular Room service appropriate? Yes with Assist; Fluid consistency: Thin  Diet effective now                   EDUCATION NEEDS:   Education needs have been addressed  Skin:  Skin Assessment: Skin Integrity Issues: Skin Integrity Issues:: Other (Comment) Other: venous stasis R/L leg  Last BM:  7/10  Height:   Ht Readings from Last 1 Encounters:  05/27/21 _0  (1.778 m)    Weight:   Wt Readings from Last 1 Encounters:  06/04/21 46.1 kg     BMI:  Body mass index is 14.58 kg/m.  Estimated Nutritional Needs:   Kcal:  1300-1500 kcal  Protein:  70-90 grams  Fluid:  >/= 1.3 L/day    Lucas Mallow, RD, LDN, CNSC Please refer to Amion for contact information.

## 2021-06-04 NOTE — Progress Notes (Signed)
[facility] will sign off.   Medication Recommendations: Continue Amiodarone 267m QD, Xarelto 141m  Other recommendations: Restrict free water for hyponatremia  Follow up as an outpatient:  Arranged for 06/17/21>>see AVS    JiKathyrn DrownP-C [facility] Pager: [phone]

## 2021-06-05 DIAGNOSIS — J690 Pneumonitis due to inhalation of food and vomit: Secondary | ICD-10-CM | POA: Diagnosis not present

## 2021-06-05 LAB — RESP PANEL BY RT-PCR (FLU A&B, COVID) ARPGX2
Influenza A by PCR: NEGATIVE
Influenza B by PCR: NEGATIVE
SARS Coronavirus 2 by RT PCR: POSITIVE — AB

## 2021-06-05 MED ORDER — OXYCODONE-ACETAMINOPHEN 10-325 MG PO TABS: 1.0000 | ORAL_TABLET | Freq: Three times a day (TID) | ORAL | 0 refills | Status: DC | PRN

## 2021-06-05 MED ORDER — ONDANSETRON HCL 4 MG/2ML IJ SOLN
4.0000 mg | Freq: Once | INTRAMUSCULAR | Status: AC
  Administered 2021-06-05: 4 mg via INTRAVENOUS
  Filled 2021-06-05: qty 2

## 2021-06-05 MED ORDER — POLYETHYLENE GLYCOL 3350 17 G PO PACK: 17.0000 g | PACK | Freq: Every day | ORAL | 0 refills | Status: AC | PRN

## 2021-06-05 MED ORDER — NIRMATRELVIR/RITONAVIR (PAXLOVID)TABLET: 3.0000 | ORAL_TABLET | Freq: Two times a day (BID) | ORAL | Status: DC

## 2021-06-05 NOTE — Progress Notes (Signed)
Manufacturing engineer [facility])  Hospital Liaison RN note         Notified by [facility] manager of patient/family request for [facility] Palliative services at home after discharge.              [place] Palliative team will follow up with patient after discharge.         Please call with any hospice or palliative related questions.         Thank you for the opportunity to participate in this patient's care.     Domenic Moras, BSN, RN [facility] Liaison (listed on [place] under Hospice/[facility])    [phone] (24h on call)

## 2021-06-05 NOTE — Progress Notes (Addendum)
HD#10 SUBJECTIVE:  Overnight Events: no acute events overnight  Patient was seen at bedside this morning. She endorses nausea and generalized weakness. She was unable to participate much during PT/OT session yesterday. She states her nausea is better controlled with IV Zofran instead of PO. She will attempt to eat breakfast once the nausea wears off.   OBJECTIVE:   Vital Signs: Vitals:   06/04/21 2045 06/05/21 0357 06/05/21 0715 06/05/21 0823  BP: 108/60 113/62  111/67  Pulse: 67 66 67 73  Resp: _0 Temp: 97.8 F (36.6 C) 97.9 F (36.6 C)  98 F (36.7 C)  TempSrc: Oral Oral  Oral  SpO2: 95% 94%  92%  Weight:  44.6 kg    Height:       Supplemental O2: Nasal Cannula SpO2: 92 % O2 Flow Rate (L/min): 2 L/min  Filed Weights   06/03/21 0523 06/04/21 0326 06/05/21 0357  Weight: 48.7 kg 46.1 kg 44.6 kg     Intake/Output Summary (Last 24 hours) at 06/05/2021 1359 Last data filed at 06/04/2021 2047 Gross per 24 hour  Intake 50 ml  Output 1275 ml  Net -1225 ml    Net IO Since Admission: -1,678.18 mL [06/05/21 1359]  BP 127/69 (BP Location: Right Arm)   Pulse (!) 57   Temp 97.9 F (36.6 C) (Oral)   Resp 16   Ht _1  (1.778 m)   Wt 49 kg   SpO2 96%   BMI 15.50 kg/m   Constitutional: tired and weak- appearing, cachetic female laying in bed, in no acute distress Cardiovascular: regular rate and rhythm Pulmonary/Chest: normal work of breathing on 3L [place], upper and lower lung fields CTAB except for intermittent wheezes MSK: poor bulk and tone Neurological: alert & oriented x 3, answers questions appropriately  Psych: affect depressed, mood congruent  Patient Lines/Drains/Airways Status     Active Line/Drains/Airways     Name Placement date Placement time Site Days   Peripheral IV 05/27/21 20 G Left Antecubital 05/27/21  1225  Antecubital  2   Peripheral IV 05/27/21 20 G Right Antecubital 05/27/21  1310  Antecubital  2   Peripheral IV 05/27/21 20 G 1"  Anterior;Left;Proximal;Upper Arm 05/27/21  1940  Arm  2   External Urinary Catheter 05/27/21  1707  --  2   Wound / Incision (Open or Dehisced) 02/24/21 Venous stasis ulcer Ankle Anterior;Right 02/24/21  1011  Ankle  94   Wound / Incision (Open or Dehisced) 05/27/21 Venous stasis ulcer Ankle Left;Medial 05/27/21  1707  Ankle  2            Labs: CBC Latest Ref Rng & Units 06/01/2021 05/31/2021 05/30/2021  WBC 4.0 - 10.5 K/uL 7.7 8.0 5.9  Hemoglobin 12.0 - 15.0 g/dL 9.1(L) 9.1(L) 9.6(L)  Hematocrit 36.0 - 46.0 % 28.0(L) 28.3(L) 30.2(L)  Platelets 150 - 400 K/uL 426(H) 454(H) 446(H)    CMP Latest Ref Rng & Units 06/03/2021 06/03/2021 06/02/2021  Glucose 70 - 99 mg/dL 89 90 106(H)  BUN 8 - 23 mg/dL _2 Creatinine 0.44 - 1.00 mg/dL 0.64 0.69 0.75  Sodium 135 - 145 mmol/L 126(L) 126(L) 127(L)  Potassium 3.5 - 5.1 mmol/L 5.0 5.0 4.8  Chloride 98 - 111 mmol/L 88(L) 89(L) 87(L)  CO2 22 - 32 mmol/L 33(H) 33(H) 36(H)  Calcium 8.9 - 10.3 mg/dL 8.0(L) 7.8(L) 7.9(L)  Total Protein 6.5 - 8.1 g/dL - - -  Total Bilirubin 0.3 - 1.2  mg/dL - - -  Alkaline Phos 38 - 126 U/L - - -  AST 15 - 41 U/L - - -  ALT 0 - 44 U/L - - -     ASSESSMENT/PLAN:   Assessment: Principal Problem:   Aspiration pneumonia (HCC) Active Problems:   Venous insufficiency of both lower extremities   Atrial fibrillation with RVR (HCC)   Protein-calorie malnutrition, severe   CAP (community acquired pneumonia)   Plan:  COVID-19 Infection COVID test resulted positive. She is afebrile but does endorse weakness. As such she will not be going to SNF today despite having [facility] bed [facility].    Chronic Hyponatremia (Stable) Most recent Na stable at 126. Baseline 128-130. No concerns for mental status changes or seizures. Likely due to decreased PO intake over the last few days as she was having amiodarone-associated nausea. Improving with Zofran. - Free water restrict to 1221m daily - Morning CMP - Zofran for  Nausea - meal encouragement  Aspiration Pneumonia (Resolved): Completed 6 days of antibiotic therapy during this admission. Cough and breathing markedly improved since admission. Continues to saturate well on baseline 3L [place]. No changes in management. - Aspiration precautions  Atrial Fibrillation with RVR (Resolved) Remains in NSR. Experiencing nausea likely secondary to amiodarone therapy, improves with IV Zofran. No changes to management. Cardiology signed off. - Oral Amiodarone 200 mg daily - Continue Xarelto 15 mg daily    COPD Exacerbation (Resolved) Saturating appropriately on home oxygen 3L nasal canula.  No changes to management. - Continue home COPD medications (Maretta Bees Brovana, Pulmicort) - Continue Duonebs q4 hours PRN - Incentive spirometry/flutter valve   Bilateral lower extremity venous stasis ulcers 2/2 Chronic venous insufficiency Continue daily dressing changes.  Best Practice: Diet: Regular diet IVF: none VTE: Rivaroxaban (XARELTO) tablet 15 mg  Code: DNR PT/OT: Home health PT Family Contact: Notified DISPO: Awaiting SNF placement/ Palliative  AOk Edwards MS4 Pager: [phone]/14/2022,1:59 PM

## 2021-06-05 NOTE — Progress Notes (Signed)
PT Cancellation Note  Patient Details Name: Kristen [name] MRN: [id number] DOB: 08/09/1946   Cancelled Treatment:    Reason Eval/Treat Not Completed: Other (comment).  Pt reports nausea is better but wants to wait to do therapy.  Follow up as time and pt allow.   Ramond Dial 06/05/2021, 1:39 PM  Mee Hives, PT MS Acute Rehab Dept. Number: [place] and [place]

## 2021-06-05 NOTE — Care Management Important Message (Signed)
Important Message  Patient Details  Name: Kristen [name] MRN: [id number] Date of Birth: 23-Sep-1946   Medicare Important Message Given:  Yes     Shelda Altes 06/05/2021, 11:32 AM

## 2021-06-05 NOTE — TOC Progression Note (Addendum)
Transition of Care [facility]) - Progression Note    Patient Details  Name: Kristen [name] MRN: [id number] Date of Birth: 08-18-1946  Transition of Care [facility]) CM/SW [place] Ford, [place] Phone Number: 06/05/2021, 10:11 AM  Clinical Narrative:     Update-CSW updated Tressa Busman with [facility] that patient will not be dcing over today due to covid result.Tressa Busman with [facility] informed CSW they will not be able to accept patient until 7/25.CSW will continue to follow and assist with dc planning needs.  CSW received insurance authorization approval for patient. Navi ID # [id number] . Start date is 7/14 through 7/18. Next review date is 7/18. Patient has SNF bed at [facility] when medically ready. MD informed. CSW received consult for palliative services to follow patient at SNF. CSW spoke with patient at bedside and patient gave CSW permission to make referral to [facility] for palliative services to follow her at SNF. CSW called [facility] and made referral for patient to [place].CSW will continue to follow and assist with dc planning needs.  Expected Discharge Plan: [place] Barriers to Discharge: Continued Medical Work up  Expected Discharge Plan and Services Expected Discharge Plan: [place] In-house Referral: Clinical Social Work Discharge Planning Services: CM Consult Post Acute Care Choice: [place] Living arrangements for the past 2 months: Single Family Home Expected Discharge Date: 06/03/21                 DME Agency: NA                   Social Determinants of Health ([place]) Interventions    Readmission Risk Interventions Readmission Risk Prevention Plan 06/02/2021 09/29/2020  Transportation Screening Complete Complete  PCP or Specialist Appt within 5-7 Days - Complete  Home Care Screening - Complete  Medication Review (RN CM) - Complete  Medication Review Press photographer) Complete -  PCP or Specialist appointment  within 3-5 days of discharge Complete -  [place] or Home Care Consult Complete -  SW Recovery Care/Counseling Consult Complete -  Palliative Care Screening Complete -  [place] Complete -  Some recent data might be hidden

## 2021-06-05 NOTE — Progress Notes (Signed)
PT Cancellation Note  Patient Details Name: Kristen [name] MRN: [id number] DOB: 1946-02-18   Cancelled Treatment:    Reason Eval/Treat Not Completed: Other (comment).  Pt is having nausea, nursing is contacted to get treatment and will retry later.   Ramond Dial 06/05/2021, 12:00 PM  Mee Hives, PT MS Acute Rehab Dept. Number: [place] and [place]

## 2021-06-06 DIAGNOSIS — J690 Pneumonitis due to inhalation of food and vomit: Secondary | ICD-10-CM | POA: Diagnosis not present

## 2021-06-06 LAB — BASIC METABOLIC PANEL
Anion gap: 6 (ref 5–15)
BUN: 8 mg/dL (ref 8–23)
CO2: 28 mmol/L (ref 22–32)
Calcium: 8.3 mg/dL — ABNORMAL LOW (ref 8.9–10.3)
Chloride: 90 mmol/L — ABNORMAL LOW (ref 98–111)
Creatinine, Ser: 0.6 mg/dL (ref 0.44–1.00)
GFR, Estimated: >60 mL/min (ref >60)
Glucose, Bld: 104 mg/dL — ABNORMAL HIGH (ref 70–99)
Potassium: 4.1 mmol/L (ref 3.5–5.1)
Sodium: 124 mmol/L — ABNORMAL LOW (ref 135–145)

## 2021-06-06 MED ORDER — POLYVINYL ALCOHOL 1.4 % OP SOLN
1.0000 [drp] | OPHTHALMIC | Status: DC | PRN
  Administered 2021-06-06: 1 [drp] via OPHTHALMIC
  Filled 2021-06-06: qty 15

## 2021-06-06 NOTE — Progress Notes (Signed)
HD#10 SUBJECTIVE:  Overnight Events: no acute events overnight Patient did not engage much with PT yesterday.  Patient seen at beside. She states she feels better than she has the last two days. The nausea and weakness is improving. Comfortable from a breathing standpoint. She is looking forward to eating her breakfast and endorses increased water and soda intake over the last few days. She denies leg swelling. She requests limited visitations in her room. She feels more comfortable staying in the hospital until she is cleared to go to SNF from a COVID standpoint.    OBJECTIVE:   Vital Signs: Vitals:   06/05/21 1940 06/05/21 2013 06/06/21 0110 06/06/21 0510  BP:  (!) 94/53  (!) 97/55  Pulse:  69  64  Resp:  16  14  Temp:  97.8 F (36.6 C)  97.8 F (36.6 C)  TempSrc:  Oral  Oral  SpO2: 94%  94% 94%  Weight:    41.3 kg  Height:       Supplemental O2: Nasal Cannula SpO2: 94 % O2 Flow Rate (L/min): 2 L/min  Filed Weights   06/04/21 0326 06/05/21 0357 06/06/21 0510  Weight: 46.1 kg 44.6 kg 41.3 kg     Intake/Output Summary (Last 24 hours) at 06/06/2021 1416 Last data filed at 06/06/2021 0837 Gross per 24 hour  Intake 537 ml  Output 1150 ml  Net -613 ml   Net IO Since Admission: -2,291.18 mL [06/06/21 1416]  BP 127/69 (BP Location: Right Arm)   Pulse (!) 57   Temp 97.9 F (36.6 C) (Oral)   Resp 16   Ht 5' 10" (1.778 m)   Wt 49 kg   SpO2 96%   BMI 15.50 kg/m   Constitutional: cachectic-appearing female, sitting up in bed, in no acute distress, more engaged than yesterday Cardiovascular: Normal Sinus Rhythm, normal skin turgor Pulmonary/Chest: normal work of breathing on 3L [place],  MSK: poor bulk and tone Neurological: alert & oriented x 3, answers questions appropriately  Psych: affect mood congruent  Patient Lines/Drains/Airways Status     Active Line/Drains/Airways     Name Placement date Placement time Site Days   Peripheral IV 05/27/21 20 G Left  Antecubital 05/27/21  1225  Antecubital  2   Peripheral IV 05/27/21 20 G Right Antecubital 05/27/21  1310  Antecubital  2   Peripheral IV 05/27/21 20 G 1" Anterior;Left;Proximal;Upper Arm 05/27/21  1940  Arm  2   External Urinary Catheter 05/27/21  1707  --  2   Wound / Incision (Open or Dehisced) 02/24/21 Venous stasis ulcer Ankle Anterior;Right 02/24/21  1011  Ankle  94   Wound / Incision (Open or Dehisced) 05/27/21 Venous stasis ulcer Ankle Left;Medial 05/27/21  1707  Ankle  2            Labs: CBC Latest Ref Rng & Units 06/01/2021 05/31/2021 05/30/2021  WBC 4.0 - 10.5 K/uL 7.7 8.0 5.9  Hemoglobin 12.0 - 15.0 g/dL 9.1(L) 9.1(L) 9.6(L)  Hematocrit 36.0 - 46.0 % 28.0(L) 28.3(L) 30.2(L)  Platelets 150 - 400 K/uL 426(H) 454(H) 446(H)    CMP Latest Ref Rng & Units 06/06/2021 06/03/2021 06/03/2021  Glucose 70 - 99 mg/dL 104(H) 89 90  BUN 8 - 23 mg/dL _0 Creatinine 0.44 - 1.00 mg/dL 0.60 0.64 0.69  Sodium 135 - 145 mmol/L 124(L) 126(L) 126(L)  Potassium 3.5 - 5.1 mmol/L 4.1 5.0 5.0  Chloride 98 - 111 mmol/L 90(L) 88(L) 89(L)  CO2 22 -  32 mmol/L 28 33(H) 33(H)  Calcium 8.9 - 10.3 mg/dL 8.3(L) 8.0(L) 7.8(L)  Total Protein 6.5 - 8.1 g/dL - - -  Total Bilirubin 0.3 - 1.2 mg/dL - - -  Alkaline Phos 38 - 126 U/L - - -  AST 15 - 41 U/L - - -  ALT 0 - 44 U/L - - -     ASSESSMENT/PLAN:   Assessment: Principal Problem:   Aspiration pneumonia (HCC) Active Problems:   Venous insufficiency of both lower extremities   Atrial fibrillation with RVR (HCC)   Protein-calorie malnutrition, severe   CAP (community acquired pneumonia)   Plan:  COVID-19 Infection Incidental COVID-19 positive as of yesterday, hence patient was unable to go to SNF. She is not having worsening of pulmonary symptoms from baseline. She is afebrile. Weakness improved today from yesterday. She prefers to stay in the hospital until SNF placement. Given her chronic COPD, and recent recovery from aspiration PNA, she is at  higher risk for COVID-related complications. Paxlovid contraindicated as patient is on amiodarone for A-fib. Will connect with pharmacy to discuss alternate anti-viral options.  - Routine Vitals and supplemental oxygen as needed  Chronic Hyponatremia (Stable) Na decreased to 124 today. Baseline 128-130. Mentation appropriate. Endorses increased fluid intake, likely not restricted to 1200 mL/daily. Most likely etiology is dilute solute state. Will encourage appropriate fluid restriction and continue to monitor. Will also consider repeating hyponatremia labs if fluid restriction does not improve the hyponatremia.  - Free water restrict to 128m daily - Morning CMP - Zofran for Nausea - meal encouragement  Aspiration Pneumonia (Resolved): Completed 6 days of antibiotic therapy during this admission. Cough and breathing markedly improved since admission. Continues to saturate well on baseline 3L [place]. No changes in management. - Aspiration precautions  Atrial Fibrillation with RVR (Resolved) Has continued to remain in NSR. Nausea improved today; responds well to IV zofran.Cardiology has signed off. Will continue telemetry monitoring given current COVID 19 infection. - Oral Amiodarone 200 mg daily - Continue Xarelto 15 mg daily    COPD Exacerbation (Resolved) Patient's respiratory status stable on home oxygen 3L [place]. Will continue maintenance regimen.  - Continue home COPD medications (Yupelri, Brovana, Pulmicort) - Continue Duonebs q4 hours PRN - Incentive spirometry/flutter valve   Bilateral lower extremity venous stasis ulcers 2/2 Chronic venous insufficiency Continue daily dressing changes.  Best Practice: Diet: Regular diet; 1200 mL fluid restricted IVF: none VTE: Rivaroxaban (XARELTO) tablet 15 mg  Code: DNR PT/OT: Home health PT Family Contact: Notified DISPO: Awaiting SNF placement/ Palliative  AOk Edwards MS4 Pager: [phone]/15/2022,2:16 PM

## 2021-06-07 LAB — BASIC METABOLIC PANEL
Anion gap: 7 (ref 5–15)
BUN: 10 mg/dL (ref 8–23)
CO2: 29 mmol/L (ref 22–32)
Calcium: 8.3 mg/dL — ABNORMAL LOW (ref 8.9–10.3)
Chloride: 89 mmol/L — ABNORMAL LOW (ref 98–111)
Creatinine, Ser: 0.69 mg/dL (ref 0.44–1.00)
GFR, Estimated: >60 mL/min (ref >60)
Glucose, Bld: 82 mg/dL (ref 70–99)
Potassium: 4.2 mmol/L (ref 3.5–5.1)
Sodium: 125 mmol/L — ABNORMAL LOW (ref 135–145)

## 2021-06-07 MED ORDER — ALBUTEROL SULFATE HFA 108 (90 BASE) MCG/ACT IN AERS
1.0000 | INHALATION_SPRAY | RESPIRATORY_TRACT | Status: DC | PRN
  Administered 2021-06-08 – 2021-06-11 (×7): 2 via RESPIRATORY_TRACT
  Administered 2021-06-12: 1 via RESPIRATORY_TRACT
  Administered 2021-06-12: 2 via RESPIRATORY_TRACT
  Administered 2021-06-13: 1 via RESPIRATORY_TRACT
  Administered 2021-06-13 – 2021-06-17 (×7): 2 via RESPIRATORY_TRACT
  Filled 2021-06-07 (×2): qty 6.7

## 2021-06-07 MED ORDER — MOMETASONE FURO-FORMOTEROL FUM 200-5 MCG/ACT IN AERO
1.0000 | INHALATION_SPRAY | Freq: Two times a day (BID) | RESPIRATORY_TRACT | Status: DC
  Administered 2021-06-07 – 2021-06-17 (×21): 1 via RESPIRATORY_TRACT
  Filled 2021-06-07: qty 8.8

## 2021-06-07 MED ORDER — UMECLIDINIUM BROMIDE 62.5 MCG/INH IN AEPB
1.0000 | INHALATION_SPRAY | Freq: Every day | RESPIRATORY_TRACT | Status: DC
  Administered 2021-06-07 – 2021-06-17 (×11): 1 via RESPIRATORY_TRACT
  Filled 2021-06-07 (×2): qty 7

## 2021-06-07 NOTE — Progress Notes (Signed)
Physical Therapy Treatment Patient Details Name: Kristen [name] MRN: [id number] DOB: August 13, 1946 Today's Date: 06/07/2021    History of Present Illness 75 yo admitted 7/5 with SOB with AFib with RVR. Attempted cardioversion x 3 unsuccessful. PMHx: COPD on 3L home O2, chronic Afib, cellulitis, hyponatremia, Chronic venous stasis dermatitis.    PT Comments    Pt received in supine, agreeable to limited therapy session in supine with heavy encouragement. Pt self-limiting due to symptoms of low back/leg pain and persistent nausea. Pt repositioned in supine with minA using overhead rail but reports nausea increased with attempt at upright positioning for long sitting attempt, agreeable to supine UE/LE exercises. Pt with good effort for therapeutic exercises as detailed below but needed increased time due to pain/fatigue. Of note, pt received asleep with O2 [place] out of place and SpO2 83% on arrival, once [place] replaced SpO2 improved to >90% but did take ~3 minutes to reach 92% and cues for pursed-lip breathing. Plan for seated/standing activities next day, will plan to premedicate for pain/nausea prior to next session if time allows. Pt continues to benefit from PT services to progress toward functional mobility goals.    Follow Up Recommendations  SNF     Equipment Recommendations  None recommended by PT    Recommendations for Other Services       Precautions / Restrictions Precautions Precautions: Fall;Other (comment) Precaution Comments: watch vitals, BP has been soft, 3L baseline O2 Restrictions Weight Bearing Restrictions: No    Mobility  Bed Mobility Overal bed mobility: Needs Assistance Bed Mobility:  (posterior supine scoot)     General bed mobility comments: HOB flat, bed in trendelenburg, pt able to reach overhead rail and push with BLE with cues for sequencing, minA for posterior supine scooting to [facility]; pt refusing long sit or EOB due to persistent nausea even after meds given             Balance       Sitting balance - Comments: UTA, pt refusing EOB/long sitting due to persistent nausea              Cognition Arousal/Alertness: Awake/alert Behavior During Therapy: Anxious Overall Cognitive Status: Within Functional Limits for tasks assessed           General Comments: decreased insight into importance of mobility and pt did report decreased pain/improved comfort once repositioned in bed, but continues to refuse EOB/OOB due to nausea; limited participation with encouragement but self-limiting      Exercises Other Exercises Other Exercises: supine BLE AROM: ankle pumps, SAQ, hip abduction, heel slides, glute sets x10 reps ea Other Exercises: supine BUE AROM: arm "bicycle" x20 reps, elbow flex/ext x10 reps ea Other Exercises: HEP handout given to reinforce, pt encouraged to attempt 3-4x/day    General Comments General comments (skin integrity, edema, etc.): Pt received asleep with SpO2 83%, noted to have O2 [place] out of nose, pt [place] replaced on 3L and SpO2 improved to >90% within 3 mins. BP 85/47 (60) taken 2hrs prior to session, after supine UE exercises BP improved to 104/73 (77). HR 70's bpm with supine activity      Pertinent Vitals/Pain Pain Assessment: 0-10 Pain Score: 7  Pain Location: LBP, BLE pain Pain Descriptors / Indicators: Discomfort;Grimacing;Sore Pain Intervention(s): Limited activity within patient's tolerance;Monitored during session;Repositioned;RN gave pain meds during session;Other (comment) (heels floated for comfort at end, pt refused semi-sidelying position due to nausea)     PT Goals (current goals can now be found in the  care plan section) Acute Rehab PT Goals Patient Stated Goal: not stated PT Goal Formulation: With patient Time For Goal Achievement: 06/12/21 Progress towards PT goals: Not progressing toward goals - comment (nausea/pain and fatigue limiting)    Frequency    Min 3X/week      PT Plan Current plan  remains appropriate    Co-evaluation              AM-PAC PT "6 Clicks" Mobility   Outcome Measure  Help needed turning from your back to your side while in a flat bed without using bedrails?: A Little Help needed moving from lying on your back to sitting on the side of a flat bed without using bedrails?: A Little Help needed moving to and from a bed to a chair (including a wheelchair)?: A Little Help needed standing up from a chair using your arms (e.g., wheelchair or bedside chair)?: A Lot Help needed to walk in hospital room?: A Lot Help needed climbing 3-5 steps with a railing? : Total 6 Click Score: 14    End of Session Equipment Utilized During Treatment: Oxygen Activity Tolerance: Patient limited by fatigue;Other (comment);Patient limited by pain (nausea/pain limiting) Patient left: in bed;with call bell/phone within reach;with bed alarm set;Other (comment) (heels floated, UE resting on pillows, encouraged her to attempt semi-sidelying position once nausea decreases) Nurse Communication: Mobility status;Other (comment) (nausea (meds given during session), pt needs to reposition every 2 hours due to limited participation past 4 days.) PT Visit Diagnosis: Difficulty in walking, not elsewhere classified (R26.2);Other abnormalities of gait and mobility (R26.89);Muscle weakness (generalized) (M62.81)     Time: 2841-3244 PT Time Calculation (min) (ACUTE ONLY): 39 min  Charges:  $Therapeutic Exercise: 23-37 mins $Therapeutic Activity: 8-22 mins                     Geraldine Sandberg P., PTA Acute Rehabilitation Services Pager: [phone] Office: [place] 06/07/2021, 1:06 PM

## 2021-06-07 NOTE — Progress Notes (Signed)
HD#11 Subjective:    Patient says she is doing well this morning, says that the nausea she sometimes has is not bothering her today. Has been able to eat about half her breakfast and has no new complaints.   Objective:  Vital signs in last 24 hours: Vitals:   06/06/21 0110 06/06/21 0510 06/06/21 1938 06/07/21 0412  BP:  (!) 97/55 (!) 101/54 (!) 110/59  Pulse:  64  63  Resp:  _0 Temp:  97.8 F (36.6 C) 97.6 F (36.4 C) 97.9 F (36.6 C)  TempSrc:  Oral Oral Oral  SpO2: 94% 94%  95%  Weight:  41.3 kg  44.1 kg  Height:       Supplemental O2: Nasal Cannula SpO2: 95 % O2 Flow Rate (L/min): 2 L/min   Physical Exam:  Constitutional:cachectic woman sitting in bed, in no acute distress Cardiovascular: regular rate and rhythm, no m/r/g Pulmonary/Chest: normal work of breathing  MSK: poor bulk and tone Neurological: alert & answering questions appropriately Skin: warm and dry  Filed Weights   06/05/21 0357 06/06/21 0510 06/07/21 0412  Weight: 44.6 kg 41.3 kg 44.1 kg     Intake/Output Summary (Last 24 hours) at 06/07/2021 0801 Last data filed at 06/07/2021 0700 Gross per 24 hour  Intake 177 ml  Output 650 ml  Net -473 ml   Net IO Since Admission: -2,941.18 mL [06/07/21 0801]  Pertinent Labs: CBC Latest Ref Rng & Units 06/01/2021 05/31/2021 05/30/2021  WBC 4.0 - 10.5 K/uL 7.7 8.0 5.9  Hemoglobin 12.0 - 15.0 g/dL 9.1(L) 9.1(L) 9.6(L)  Hematocrit 36.0 - 46.0 % 28.0(L) 28.3(L) 30.2(L)  Platelets 150 - 400 K/uL 426(H) 454(H) 446(H)    CMP Latest Ref Rng & Units 06/07/2021 06/06/2021 06/03/2021  Glucose 70 - 99 mg/dL 82 104(H) 89  BUN 8 - 23 mg/dL _1 Creatinine 0.44 - 1.00 mg/dL 0.69 0.60 0.64  Sodium 135 - 145 mmol/L 125(L) 124(L) 126(L)  Potassium 3.5 - 5.1 mmol/L 4.2 4.1 5.0  Chloride 98 - 111 mmol/L 89(L) 90(L) 88(L)  CO2 22 - 32 mmol/L 29 28 33(H)  Calcium 8.9 - 10.3 mg/dL 8.3(L) 8.3(L) 8.0(L)  Total Protein 6.5 - 8.1 g/dL - - -  Total Bilirubin 0.3 - 1.2  mg/dL - - -  Alkaline Phos 38 - 126 U/L - - -  AST 15 - 41 U/L - - -  ALT 0 - 44 U/L - - -    Imaging: No results found.  Assessment/Plan:   Principal Problem:   Aspiration pneumonia ([place]) Active Problems:   Venous insufficiency of both lower extremities   Atrial fibrillation with RVR (HCC)   Protein-calorie malnutrition, severe   CAP (community acquired pneumonia)   Patient Summary: Kristen [name] is a 75 y.o. with a pertinent PMH of PNA and COPD on home O2, who presented with worsening shortness of breath and chest tightness and admitted for afib with RVR in HR 150s.   COVID-19 Infection Incidental COVID-19 positive on 8/59, complicating transport to SNF. She is not having worsening of pulmonary symptoms from baseline. She is afebrile. Given her chronic COPD, and recent recovery from aspiration PNA, she is at higher risk for COVID-related complications. Paxlovid contraindicated as patient is on amiodarone for A-fib. Will continue to discuss anti-viral options with pharmacy.  - Routine Vitals and supplemental oxygen as needed   Chronic Hyponatremia (Stable) Na running 124-125 from baseline 128-130. Mentation appropriate. Endorses increased fluid intake, likely not  restricted to 1200 mL/daily. Most likely etiology is dilute solute state. Will encourage appropriate fluid restriction and continue to monitor. Will also consider repeating hyponatremia labs if fluid restriction does not improve the hyponatremia.  - Free water restrict to 1256m daily - Morning CMP - Zofran for Nausea - meal encouragement   Aspiration Pneumonia (Resolved): Completed 6 days of antibiotic therapy during this admission. Cough and breathing markedly improved since admission. Continues to saturate well on baseline 3L [place]. No changes in management. - Aspiration precautions   Atrial Fibrillation with RVR (Resolved) Has continued to remain in NSR. Nausea improved today; responds well to IV zofran.Cardiology has  signed off. Will continue telemetry monitoring given current COVID 19 infection. - Oral Amiodarone 200 mg daily - Continue Xarelto 15 mg daily              COPD Exacerbation (Resolved) Patient's respiratory status stable on home oxygen 3L [place]. Will continue maintenance regimen.  - Continue home COPD medications (Yupelri, Brovana, Pulmicort) - Continue Duonebs q4 hours PRN - Incentive spirometry/flutter valve   Bilateral lower extremity venous stasis ulcers 2/2 Chronic venous insufficiency Continue daily dressing changes.  Best Practice: Diet: Regular diet IVF: none VTE: Rivaroxaban (XARELTO) tablet 15 mg  Code: DNR PT/OT: Home health PT Family Contact: Notified DISPO: Awaiting SNF placement/ Palliative  AScarlett Presto MD Internal Medicine Resident PGY-1 Pager [phone] contact the on call pager after 5 pm and on weekends at [phone]

## 2021-06-08 DIAGNOSIS — L8990 Pressure ulcer of unspecified site, unspecified stage: Secondary | ICD-10-CM | POA: Insufficient documentation

## 2021-06-08 LAB — BASIC METABOLIC PANEL
Anion gap: 7 (ref 5–15)
Anion gap: 7 (ref 5–15)
BUN: 11 mg/dL (ref 8–23)
BUN: 9 mg/dL (ref 8–23)
CO2: 29 mmol/L (ref 22–32)
CO2: 26 mmol/L (ref 22–32)
Calcium: 7.9 mg/dL — ABNORMAL LOW (ref 8.9–10.3)
Calcium: 7.9 mg/dL — ABNORMAL LOW (ref 8.9–10.3)
Chloride: 86 mmol/L — ABNORMAL LOW (ref 98–111)
Chloride: 91 mmol/L — ABNORMAL LOW (ref 98–111)
Creatinine, Ser: 0.76 mg/dL (ref 0.44–1.00)
Creatinine, Ser: 0.65 mg/dL (ref 0.44–1.00)
GFR, Estimated: >60 mL/min (ref >60)
GFR, Estimated: >60 mL/min (ref >60)
Glucose, Bld: 108 mg/dL — ABNORMAL HIGH (ref 70–99)
Glucose, Bld: 117 mg/dL — ABNORMAL HIGH (ref 70–99)
Potassium: 3.9 mmol/L (ref 3.5–5.1)
Potassium: 4.2 mmol/L (ref 3.5–5.1)
Sodium: 122 mmol/L — ABNORMAL LOW (ref 135–145)
Sodium: 124 mmol/L — ABNORMAL LOW (ref 135–145)

## 2021-06-08 LAB — GLUCOSE, CAPILLARY: Glucose-Capillary: 126 mg/dL — ABNORMAL HIGH (ref 70–99)

## 2021-06-08 LAB — MAGNESIUM: Magnesium: 1.4 mg/dL — ABNORMAL LOW (ref 1.7–2.4)

## 2021-06-08 LAB — T4, FREE: Free T4: 1.24 ng/dL — ABNORMAL HIGH (ref 0.61–1.12)

## 2021-06-08 LAB — PHOSPHORUS: Phosphorus: 3.3 mg/dL (ref 2.5–4.6)

## 2021-06-08 MED ORDER — SODIUM CHLORIDE 0.9 % IV BOLUS
500.0000 mL | Freq: Once | INTRAVENOUS | Status: AC
  Administered 2021-06-08: 500 mL via INTRAVENOUS

## 2021-06-08 MED ORDER — ONDANSETRON HCL 4 MG/2ML IJ SOLN
4.0000 mg | Freq: Four times a day (QID) | INTRAMUSCULAR | Status: DC | PRN
  Administered 2021-06-08 – 2021-06-17 (×14): 4 mg via INTRAVENOUS
  Filled 2021-06-08 (×15): qty 2

## 2021-06-08 MED ORDER — SODIUM CHLORIDE 0.9 % IV SOLN: INTRAVENOUS | Status: DC

## 2021-06-08 NOTE — Progress Notes (Signed)
HD#12 Subjective:  No acute events overnight .  This morning she reported active dizziness and nausea. Blood pressures at bedside were low with maps in the 50s. CBG normal. She was fluid resuscitated at bedside and began to endorse improvement in dizziness. EKG non-remarkable for ischemic changes and a-fib. She shared that IV zofran is effective for her nausea and would like that instead of the PO. She shares that she has been eating about half of what is served to her for each meal over the last few days. She denies chest pain or changes in her breathing from baseline. She is amenable to working with PT but her nausea limits her from doing so.   Objective:  Vital signs in last 24 hours: Vitals:   06/08/21 0433 06/08/21 1003 06/08/21 1018 06/08/21 1053  BP: (!) 104/54 (!) 115/56 (!) 93/51 113/89  Pulse: 63 67 61   Resp: _0 Temp: 97.8 F (36.6 C)     TempSrc: Oral     SpO2: 100% 100% 100%   Weight: 40.8 kg     Height:       Supplemental O2: Nasal Cannula SpO2: 100 % O2 Flow Rate (L/min): 3 L/min   Physical Exam:  Constitutional: tremulous and cachectic- appearing elderly woman sitting up in bed, in slight distress Cardiovascular: Normal sinus rhythm, regular rate Pulmonary/Chest: normal work of breathing on 3L [place] MSK: poor bulk and tone Neurological: alert & answering questions appropriately   Filed Weights   06/06/21 0510 06/07/21 0412 06/08/21 0433  Weight: 41.3 kg 44.1 kg 40.8 kg    No intake or output data in the 24 hours ending 06/08/21 1125  Net IO Since Admission: -2,941.18 mL [06/08/21 1125]  Pertinent Labs: CBC Latest Ref Rng & Units 06/01/2021 05/31/2021 05/30/2021  WBC 4.0 - 10.5 K/uL 7.7 8.0 5.9  Hemoglobin 12.0 - 15.0 g/dL 9.1(L) 9.1(L) 9.6(L)  Hematocrit 36.0 - 46.0 % 28.0(L) 28.3(L) 30.2(L)  Platelets 150 - 400 K/uL 426(H) 454(H) 446(H)    CMP Latest Ref Rng & Units 06/08/2021 06/07/2021 06/06/2021  Glucose 70 - 99 mg/dL 108(H) 82 104(H)  BUN 8 -  23 mg/dL _1 Creatinine 0.44 - 1.00 mg/dL 0.76 0.69 0.60  Sodium 135 - 145 mmol/L 122(L) 125(L) 124(L)  Potassium 3.5 - 5.1 mmol/L 3.9 4.2 4.1  Chloride 98 - 111 mmol/L 86(L) 89(L) 90(L)  CO2 22 - 32 mmol/L _2 Calcium 8.9 - 10.3 mg/dL 7.9(L) 8.3(L) 8.3(L)  Total Protein 6.5 - 8.1 g/dL - - -  Total Bilirubin 0.3 - 1.2 mg/dL - - -  Alkaline Phos 38 - 126 U/L - - -  AST 15 - 41 U/L - - -  ALT 0 - 44 U/L - - -    Imaging: No results found.  Assessment/Plan:   Principal Problem:   Aspiration pneumonia ([place]) Active Problems:   Venous insufficiency of both lower extremities   Atrial fibrillation with RVR (HCC)   Protein-calorie malnutrition, severe   CAP (community acquired pneumonia)   Patient Summary: Kristen [name] is a 75 y.o. with a pertinent PMH of PNA and COPD on home O2, who presented with worsening shortness of breath and chest tightness and admitted for afib with RVR in HR 150s. She has completed a course of antibiotics for aspiration pneumonia and was stable for discharge to SNF, however incidentally tested positive for COVID on anticipated day of discharge.  Transient Hypotension Pressures down to sbp  of 80s and maps of 50s-60s. Symptomatic with dizziness and nausea. EKG unremarkable. Improving with fluids. Likely due to combinatorial effects of hyponatremia of 122, fluid restriction, and amiodarone use in the setting of cachexia. Will monitor hemodynamic stability and fluid resuscitate as needed. - fluid resuscitate as needed - routine vitals  COVID-19 Infection Day of positive test: 7/14, barrier to SNF placement. Pulmonary status unchanged from baseline, saturating appropriately on home oxygen requirement 3L [place]. Continues to remain afebrile. Paxlovid contraindicated due to patient being treated with amiodarone for a-fib. Alternate anti-virals and Ab therapy not clinically indicated at this time as per pharmacy. Given her chronic COPD, and recent recovery from  aspiration PNA, she is at higher risk for COVID-related complications.  - Routine Vitals and  baseline supplemental oxygen 3L [place]; up titrate as needed   Chronic Hyponatremia (Stable) Na down to 122 despite fluid restriction. Baseline 128-130. No changes in mental status. Hyponatremia studies done in 02/2021 pointed towards low solute state as likely etiology, however most recent exacerbation likely secondary to hypovolemia given low blood pressures recently.  - fluid resuscitate as needed - morning BMP - Free water restrict to 1230m daily - Morning CMP - IV Zofran for Nausea - meal encouragement   Aspiration Pneumonia (Resolved): Completed 6 days of antibiotic therapy during this admission. Cough and breathing markedly improved since admission. Continues to saturate well on baseline 3L [place]. No changes in management. - Aspiration precautions   Atrial Fibrillation with RVR (Resolved) Remains in NSR. Nausea worse today, likely due to addition of low Bps and hyponatremia of 122. Cardiology has signed off. Will continue telemetry monitoring given current COVID 19 infection. - IV Zofran - Oral Amiodarone 200 mg daily - Continue Xarelto 15 mg daily              COPD Exacerbation (Resolved) Patient's respiratory status continues to remain stable on home oxygen 3L [place]. Will continue maintenance regimen.  - Continue home COPD medications (Yupelri, Brovana, Pulmicort) - Continue Duonebs q4 hours PRN - Incentive spirometry/flutter valve   Bilateral lower extremity venous stasis ulcers 2/2 Chronic venous insufficiency Continue daily dressing changes.  Best Practice: Diet: Regular diet IVF: none VTE: Rivaroxaban (XARELTO) tablet 15 mg  Code: DNR PT/OT: Home health PT Family Contact: Notified DISPO: Awaiting SNF placement/ Palliative  AOk Edwards MS4 Pager: [phone] contact the on call pager after 5 pm and on weekends at [phone]

## 2021-06-08 NOTE — Plan of Care (Signed)

## 2021-06-09 DIAGNOSIS — E43 Unspecified severe protein-calorie malnutrition: Secondary | ICD-10-CM | POA: Diagnosis not present

## 2021-06-09 DIAGNOSIS — I482 Chronic atrial fibrillation, unspecified: Secondary | ICD-10-CM

## 2021-06-09 DIAGNOSIS — J189 Pneumonia, unspecified organism: Secondary | ICD-10-CM

## 2021-06-09 LAB — BASIC METABOLIC PANEL
Anion gap: 6 (ref 5–15)
BUN: 9 mg/dL (ref 8–23)
CO2: 27 mmol/L (ref 22–32)
Calcium: 8 mg/dL — ABNORMAL LOW (ref 8.9–10.3)
Chloride: 94 mmol/L — ABNORMAL LOW (ref 98–111)
Creatinine, Ser: 0.62 mg/dL (ref 0.44–1.00)
GFR, Estimated: >60 mL/min (ref >60)
Glucose, Bld: 89 mg/dL (ref 70–99)
Potassium: 4.1 mmol/L (ref 3.5–5.1)
Sodium: 127 mmol/L — ABNORMAL LOW (ref 135–145)

## 2021-06-09 MED ORDER — SODIUM CHLORIDE 1 G PO TABS
1.0000 g | ORAL_TABLET | Freq: Two times a day (BID) | ORAL | Status: DC
  Administered 2021-06-09 – 2021-06-16 (×14): 1 g via ORAL
  Filled 2021-06-09 (×18): qty 1

## 2021-06-09 NOTE — Progress Notes (Addendum)
Occupational Therapy Treatment Patient Details Name: Kristen [name] MRN: [id number] DOB: [birth year] Today's Date: 06/09/2021    History of present illness 75 yo admitted 7/5 with SOB with AFib with RVR. Attempted cardioversion x 3 unsuccessful. PMHx: COPD on 3L home O2, chronic Afib, cellulitis, hyponatremia, Chronic venous stasis dermatitis.   OT comments  Pt is progressing well, eager to participate in therapy today with no nausea limiting performance. Pt completed upper body ADLs while sitting in the chair with 2x sit<>stand given min A to boost and steady in standing with rw. She declined further in room functional ambulation stating she was "wiped out" from previous therapy session. Pt continues to benefit from continued OT acutely to progressing ADLs and mobility. D/c plan remains appropriate.    Follow Up Recommendations  SNF    Equipment Recommendations  None recommended by OT       Precautions / Restrictions Precautions Precautions: Fall;Other (comment) (covid positive 7/14) Precaution Comments: watch vitals, BP has been soft, 3L baseline O2 Restrictions Weight Bearing Restrictions: No       Mobility Bed Mobility Overal bed mobility: Needs Assistance             General bed mobility comments: pt in chair upon arrival    Transfers Overall transfer level: Needs assistance Equipment used: Rolling walker (2 wheeled) Transfers: Sit to/from Stand Sit to Stand: Min assist         General transfer comment: min A to boost into standing x2; pt able to use BUE to boost, requires assist to elevate fully and steady in standing with rw    Balance Overall balance assessment: Needs assistance Sitting-balance support: No upper extremity supported;Feet supported Sitting balance-Leahy Scale: Fair     Standing balance support: Single extremity supported;Bilateral upper extremity supported Standing balance-Leahy Scale: Poor         ADL either performed or assessed with  clinical judgement   ADL Overall ADL's : Needs assistance/impaired     Grooming: Set up;Sitting           Upper Body Dressing : Independent;Sitting                   Functional mobility during ADLs: Minimal assistance;Cueing for safety General ADL Comments: completed upper body dressing and grooming at the chair with 2x sit<>stand gievn min A for boosting     Vision   Vision Assessment?: No apparent visual deficits          Cognition Arousal/Alertness: Awake/alert Behavior During Therapy: WFL for tasks assessed/performed Overall Cognitive Status: Within Functional Limits for tasks assessed             General Comments: eager to participate today due to no nausea and decrased pain; reported feelign "wiped out" from PT session, still agreeable to OT              General Comments VSS on 3L nasal canula    Pertinent Vitals/ Pain       Pain Assessment: Faces Faces Pain Scale: Hurts a little bit Pain Location: LBP, BLE pain Pain Descriptors / Indicators: Discomfort;Grimacing;Sore Pain Intervention(s): Limited activity within patient's tolerance;Monitored during session   Frequency  Min 2X/week        Progress Toward Goals  OT Goals(current goals can now be found in the care plan section)  Progress towards OT goals: Progressing toward goals  Acute Rehab OT Goals Patient Stated Goal: to get stronger OT Goal Formulation: With patient Time For Goal Achievement: 06/15/21 Potential  to Achieve Goals: Good ADL Goals Pt Will Perform Grooming: with modified independence;standing Pt Will Transfer to Toilet: with modified independence;ambulating Pt Will Perform Toileting - Clothing Manipulation and hygiene: with modified independence;sitting/lateral leans;sit to/from stand Additional ADL Goal #1: Pt will report 3 energy conservation techniques she can use at home.  Plan Discharge plan remains appropriate       AM-PAC OT "6 Clicks" Daily Activity      Outcome Measure   Help from another person eating meals?: None Help from another person taking care of personal grooming?: A Little Help from another person toileting, which includes using toliet, bedpan, or urinal?: A Little Help from another person bathing (including washing, rinsing, drying)?: A Little Help from another person to put on and taking off regular upper body clothing?: None Help from another person to put on and taking off regular lower body clothing?: A Little 6 Click Score: 20    End of Session Equipment Utilized During Treatment: Gait belt;Rolling walker;Oxygen  OT Visit Diagnosis: Unsteadiness on feet (R26.81);Other abnormalities of gait and mobility (R26.89);Muscle weakness (generalized) (M62.81)   Activity Tolerance Patient tolerated treatment well;Patient limited by fatigue   Patient Left in chair;with call bell/phone within reach   Nurse Communication Mobility status        Time: 1420-1435 OT Time Calculation (min): 15 min  Charges: OT General Charges $OT Visit: 1 Visit OT Treatments $Self Care/Home Management : 8-22 mins     Klaryssa Fauth A Johnthan Axtman 06/09/2021, 2:50 PM

## 2021-06-09 NOTE — Progress Notes (Signed)
HD#13 Subjective:  No acute events overnight .   Patient seen and evaluated at bedside. She reports she is feeling well this morning. She reports she has been able to eat well and not having any nausea. No shortness of breath. No dizziness. No congestion.   Objective:  Vital signs in last 24 hours: Vitals:   06/08/21 1433 06/08/21 1546 06/08/21 2003 06/09/21 0535  BP: (!) 91/52 (!) 108/59 (!) 114/58 (!) 143/92  Pulse: 60  60 (!) 56  Resp: _0 Temp:  98.1 F (36.7 C) 97.9 F (36.6 C) 97.7 F (36.5 C)  TempSrc:  Oral Oral Oral  SpO2: 97%  93% 98%  Weight:    44.5 kg  Height:       Supplemental O2: Nasal Cannula SpO2: 98 % O2 Flow Rate (L/min): 3 L/min   Physical Exam:  Constitutional: tremulous and cachectic- appearing elderly woman sitting up in bed Cardiovascular: Normal sinus rhythm, regular rate Pulmonary/Chest: normal work of breathing on 3L [place] MSK: poor bulk and tone Neurological: alert & answering questions appropriately   Filed Weights   06/07/21 0412 06/08/21 0433 06/09/21 0535  Weight: 44.1 kg 40.8 kg 44.5 kg     Intake/Output Summary (Last 24 hours) at 06/09/2021 0637 Last data filed at 06/09/2021 0340 Gross per 24 hour  Intake 2385.21 ml  Output 800 ml  Net 1585.21 ml    Net IO Since Admission: -1,355.97 mL [06/09/21 0637]  Pertinent Labs: CBC Latest Ref Rng & Units 06/01/2021 05/31/2021 05/30/2021  WBC 4.0 - 10.5 K/uL 7.7 8.0 5.9  Hemoglobin 12.0 - 15.0 g/dL 9.1(L) 9.1(L) 9.6(L)  Hematocrit 36.0 - 46.0 % 28.0(L) 28.3(L) 30.2(L)  Platelets 150 - 400 K/uL 426(H) 454(H) 446(H)    CMP Latest Ref Rng & Units 06/09/2021 06/08/2021 06/08/2021  Glucose 70 - 99 mg/dL 89 117(H) 108(H)  BUN 8 - 23 mg/dL _1 Creatinine 0.44 - 1.00 mg/dL 0.62 0.65 0.76  Sodium 135 - 145 mmol/L 127(L) 124(L) 122(L)  Potassium 3.5 - 5.1 mmol/L 4.1 4.2 3.9  Chloride 98 - 111 mmol/L 94(L) 91(L) 86(L)  CO2 22 - 32 mmol/L _2 Calcium 8.9 - 10.3 mg/dL 8.0(L)  7.9(L) 7.9(L)  Total Protein 6.5 - 8.1 g/dL - - -  Total Bilirubin 0.3 - 1.2 mg/dL - - -  Alkaline Phos 38 - 126 U/L - - -  AST 15 - 41 U/L - - -  ALT 0 - 44 U/L - - -    Imaging: No results found.  Assessment/Plan:   Principal Problem:   Aspiration pneumonia ([place]) Active Problems:   Venous insufficiency of both lower extremities   Atrial fibrillation with RVR (HCC)   Protein-calorie malnutrition, severe   CAP (community acquired pneumonia)   Pressure injury of skin   Patient Summary: Kristen [name] is a 75 y.o. with a pertinent PMH of PNA and COPD on home O2, who presented with worsening shortness of breath and chest tightness and admitted for afib with RVR in HR 150s. She has completed a course of antibiotics for aspiration pneumonia and was stable for discharge to SNF, however incidentally tested positive for COVID on anticipated day of discharge.  Transient Hypotension Pressures down to sbp of 80s and maps of 50s-60s. Symptomatic with dizziness and nausea. EKG unremarkable. Improving with fluids. Likely due to combinatorial effects of hyponatremia of 122, fluid restriction, and amiodarone use in the setting of cachexia. Will monitor hemodynamic stability  and fluid resuscitate as needed. - fluid resuscitate as needed - routine vitals  COVID-19 Infection Day of positive test: 7/14, barrier to SNF placement. Pulmonary status unchanged from baseline, saturating appropriately on home oxygen requirement 3L [employer]. Continues to remain afebrile. Paxlovid contraindicated due to patient being treated with amiodarone for a-fib. Alternate anti-virals and Ab therapy not clinically indicated at this time as per pharmacy. Given her chronic COPD, and recent recovery from aspiration PNA, she is at higher risk for COVID-related complications.  - Routine Vitals and  baseline supplemental oxygen 3L [place]; up titrate as needed   Chronic Hyponatremia (Stable) Na 127. Baseline 128-130. No changes in mental  status. Hyponatremia studies done in 02/2021 pointed towards low solute state as likely etiology, however most recent exacerbation likely secondary to hypovolemia given low blood pressures recently.  - fluid resuscitate as needed - morning BMP - Free water restriction liberalized to 1800 - Morning CMP - IV Zofran for Nausea - meal encouragement   Aspiration Pneumonia (Resolved): Continues to saturate well on baseline 3L [place]. No changes in management. - Aspiration precautions   Atrial Fibrillation with RVR (Resolved) Remains in NSR. Cardiology has signed off. Will continue telemetry monitoring given current COVID 19 infection. - IV Zofran - Oral Amiodarone 200 mg daily - Continue Xarelto 15 mg daily              COPD Exacerbation (Resolved) Patient's respiratory status continues to remain stable on home oxygen 3L [place]. Will continue maintenance regimen.  - Continue home COPD medications (Yupelri, Brovana, Pulmicort) - Continue Duonebs q4 hours PRN - Incentive spirometry/flutter valve   Bilateral lower extremity venous stasis ulcers 2/2 Chronic venous insufficiency Continue daily dressing changes.  Best Practice: Diet: Regular diet IVF: none VTE: Rivaroxaban (XARELTO) tablet 15 mg  Code: DNR PT/OT: Home health PT Family Contact: Notified DISPO: Awaiting SNF placement/ Palliative  Delene Ruffini, PGY1 Pager: [phone] Please contact the on call pager after 5 pm and on weekends at [phone].

## 2021-06-09 NOTE — Progress Notes (Signed)
Mobility Specialist: Progress Note   06/09/21 1732  Mobility  Activity Transferred:  Chair to bed  Level of Assistance Contact guard assist, steadying assist  Assistive Device None  Distance Ambulated (ft) 2 ft  Mobility Out of bed to chair with meals  Mobility Response Tolerated well  Mobility performed by Mobility specialist  $Mobility charge 1 Mobility   Assisted pt back to bed per RN request. Pt required hand held assist to stand and transfer to the bed. Pt has call bell and phone at her side. Pt requesting to have purewick put back, RN notified.   [facility] Kristen [name] Mobility Specialist Mobility Specialist Phone: [phone]

## 2021-06-09 NOTE — Progress Notes (Signed)
Physical Therapy Treatment Patient Details Name: Kristen [name] MRN: [id number] DOB: 19-May-1946 Today's Date: 06/09/2021    History of Present Illness 75 yo admitted 7/5 with SOB with AFib with RVR. Attempted cardioversion x 3 unsuccessful. Pt +covid(incidental)  on 7/14PMHx: COPD on 3L home O2, chronic Afib, cellulitis, hyponatremia, Chronic venous stasis dermatitis.    PT Comments    Pt reports no nausea today and feels better. Slow progress with mobility as functional activity tolerance is still low. Pt requires several minutes of rest after any small activity. Will continue to work toward [employer] and activity tolerance.    Follow Up Recommendations  SNF     Equipment Recommendations  None recommended by PT    Recommendations for Other Services       Precautions / Restrictions Precautions Precautions: Fall;Other (comment) (covid positive 7/14) Precaution Comments: watch vitals, BP has been soft, 3L baseline O2 Restrictions Weight Bearing Restrictions: No    Mobility  Bed Mobility Overal bed mobility: Needs Assistance Bed Mobility: Supine to Sit     Supine to sit: Min guard;HOB elevated     General bed mobility comments: Incr time and effort    Transfers Overall transfer level: Needs assistance Equipment used: Rolling walker (2 wheeled) Transfers: Sit to/from Stand Sit to Stand: Min assist         General transfer comment: Assist to bring hips up and for balance  Ambulation/Gait Ambulation/Gait assistance: Min assist Gait Distance (Feet): 6 Feet Assistive device: Rolling walker (2 wheeled) Gait Pattern/deviations: Step-through pattern;Decreased step length - right;Decreased step length - left;Shuffle;Trunk flexed Gait velocity: decr Gait velocity interpretation: <1.31 ft/sec, indicative of household ambulator General Gait Details: Assist for balance and support   Stairs             Wheelchair Mobility    Modified Rankin (Stroke Patients  Only)       Balance Overall balance assessment: Needs assistance Sitting-balance support: No upper extremity supported;Feet supported Sitting balance-Leahy Scale: Fair     Standing balance support: Bilateral upper extremity supported Standing balance-Leahy Scale: Poor Standing balance comment: walker and min guard for static standing                            Cognition Arousal/Alertness: Awake/alert Behavior During Therapy: WFL for tasks assessed/performed Overall Cognitive Status: Within Functional Limits for tasks assessed                                 General Comments: eager to participate today due to no nausea and decrased pain; reported feelign "wiped out" from PT session, still agreeable to OT      Exercises      General Comments General comments (skin integrity, edema, etc.): VSS on 4L O2      Pertinent Vitals/Pain Pain Assessment: Faces Faces Pain Scale: Hurts a little bit Pain Location: LBP, BLE pain Pain Descriptors / Indicators: Discomfort;Grimacing;Sore Pain Intervention(s): Limited activity within patient's tolerance;Repositioned    Home Living                      Prior Function            PT Goals (current goals can now be found in the care plan section) Acute Rehab PT Goals Patient Stated Goal: to get stronger Progress towards PT goals: Progressing toward goals    Frequency  Min 2X/week      PT Plan Current plan remains appropriate;Frequency needs to be updated    Co-evaluation              AM-PAC PT "6 Clicks" Mobility   Outcome Measure  Help needed turning from your back to your side while in a flat bed without using bedrails?: A Little Help needed moving from lying on your back to sitting on the side of a flat bed without using bedrails?: A Little Help needed moving to and from a bed to a chair (including a wheelchair)?: A Little Help needed standing up from a chair using your arms  (e.g., wheelchair or bedside chair)?: A Little Help needed to walk in hospital room?: A Little   6 Click Score: 15    End of Session Equipment Utilized During Treatment: Oxygen Activity Tolerance: Patient limited by fatigue Patient left: in chair;with call bell/phone within reach;Other (comment) (OT present) Nurse Communication: Mobility status PT Visit Diagnosis: Difficulty in walking, not elsewhere classified (R26.2);Other abnormalities of gait and mobility (R26.89);Muscle weakness (generalized) (M62.81)     Time: 6701-4103 PT Time Calculation (min) (ACUTE ONLY): 22 min  Charges:  $Therapeutic Activity: 8-22 mins                     [place] Pager [phone] Office [place] 06/09/2021, 4:17 PM

## 2021-06-09 NOTE — TOC Progression Note (Signed)
Transition of Care [facility]) - Progression Note    Patient Details  Name: Sabiha Sura MRN: [id number] Date of Birth: [birth year]  Transition of Care [facility]) CM/SW [place], [place] Phone Number: 06/09/2021, 4:11 PM  Clinical Narrative:     CSW will continue to follow and assist patient with dc planning needs. [facility] can accept patient on 7/25 if medically ready. CSW will need to restart insurance authorization close to patient being medically ready. CSW will continue to follow for medical readiness.   Expected Discharge Plan: [place] Barriers to Discharge: No Barriers Identified  Expected Discharge Plan and Services Expected Discharge Plan: [place] In-house Referral: Clinical Social Work Discharge Planning Services: CM Consult Post Acute Care Choice: [place] arrangements for the past 2 months: Single Family Home Expected Discharge Date: 06/05/21                 DME Agency: NA                   Social Determinants of Health ([place]) Interventions    Readmission Risk Interventions Readmission Risk Prevention Plan 06/02/2021 09/29/2020  Transportation Screening Complete Complete  PCP or Specialist Appt within 5-7 Days - Complete  Home Care Screening - Complete  Medication Review (RN CM) - Complete  Medication Review Press photographer) Complete -  PCP or Specialist appointment within 3-5 days of discharge Complete -  [place] or Home Care Consult Complete -  SW Recovery Care/Counseling Consult Complete -  Palliative Care Screening Complete -  [place] Complete -  Some recent data might be hidden

## 2021-06-10 LAB — BASIC METABOLIC PANEL
Anion gap: 5 (ref 5–15)
BUN: 10 mg/dL (ref 8–23)
CO2: 27 mmol/L (ref 22–32)
Calcium: 8.3 mg/dL — ABNORMAL LOW (ref 8.9–10.3)
Chloride: 95 mmol/L — ABNORMAL LOW (ref 98–111)
Creatinine, Ser: 0.61 mg/dL (ref 0.44–1.00)
GFR, Estimated: >60 mL/min (ref >60)
Glucose, Bld: 93 mg/dL (ref 70–99)
Potassium: 4.5 mmol/L (ref 3.5–5.1)
Sodium: 127 mmol/L — ABNORMAL LOW (ref 135–145)

## 2021-06-10 MED ORDER — LORAZEPAM 2 MG/ML IJ SOLN
0.5000 mg | Freq: Once | INTRAMUSCULAR | Status: AC
  Administered 2021-06-11: 0.5 mg via INTRAVENOUS
  Filled 2021-06-10: qty 1

## 2021-06-10 NOTE — Progress Notes (Signed)
Pt stated she was anxious. Notified provider.

## 2021-06-10 NOTE — TOC Progression Note (Signed)
Transition of Care [facility]) - Progression Note    Patient Details  Name: Kristen [name] MRN: [id number] Date of Birth: 12-16-1945  Transition of Care [facility]) CM/SW [place], [place] Phone Number: 06/10/2021, 3:42 PM  Clinical Narrative:     CSW will continue to follow and assist patient with dc planning needs. [facility] can accept patient on 7/25 if medically ready. CSW will need to restart insurance authorization close to patient being medically ready. CSW will continue to follow for medical readiness.   Expected Discharge Plan: [place] Barriers to Discharge: No Barriers Identified  Expected Discharge Plan and Services Expected Discharge Plan: [place] In-house Referral: Clinical Social Work Discharge Planning Services: CM Consult Post Acute Care Choice: [place] arrangements for the past 2 months: Single Family Home Expected Discharge Date: 06/05/21                 DME Agency: NA                   Social Determinants of Health ([place]) Interventions    Readmission Risk Interventions Readmission Risk Prevention Plan 06/02/2021 09/29/2020  Transportation Screening Complete Complete  PCP or Specialist Appt within 5-7 Days - Complete  Home Care Screening - Complete  Medication Review (RN CM) - Complete  Medication Review Press photographer) Complete -  PCP or Specialist appointment within 3-5 days of discharge Complete -  [place] or Home Care Consult Complete -  SW Recovery Care/Counseling Consult Complete -  Palliative Care Screening Complete -  [place] Complete -  Some recent data might be hidden

## 2021-06-10 NOTE — Progress Notes (Signed)
Nutrition Follow-up  DOCUMENTATION CODES:   Underweight, Severe malnutrition in context of chronic illness  INTERVENTION:   Continue MVI with minerals daily. Continue Magic cup TID with meals, each supplement provides 290 kcal and 9 grams of protein. Encourage intake of meals and supplements.  NUTRITION DIAGNOSIS:   Severe Malnutrition related to chronic illness (COPD/CHF) as evidenced by severe fat depletion, severe muscle depletion, energy intake < or equal to 75% for > or equal to 1 month.  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 Admitted several times in the last year for COPD exacerbation. Presents this admission with afib RVR.  Patient incidentally found to be positive for COVID last week prior to being discharged to SNF. Discharge to SNF is now on hold. She had a few days of nausea, but it improved quickly and intake is back to normal. She is receiving a regular diet with Magic cup with meals. Meal intakes documented at 50-75-100% of meals 7/18.   Admission weight 41.6 kg Current weight 44.5 kg Weight fluctuating with fluid status.   Labs reviewed. Na 127 Medications reviewed and include MVI with minerals.  Diet Order:   Diet Order             Diet regular Room service appropriate? Yes with Assist; Fluid consistency: Thin; Fluid restriction: 1200 mL Fluid  Diet effective now           Diet - low sodium heart healthy           Diet - low sodium heart healthy                   EDUCATION NEEDS:   Education needs have been addressed  Skin:  Skin Assessment: Skin Integrity Issues: Skin Integrity Issues:: Stage II Stage II:  sacrum Other: venous stasis R/L leg  Last BM:  7/17  Height:   Ht Readings from Last 1 Encounters:  05/27/21 _0  (1.778 m)    Weight:   Wt Readings from Last 1 Encounters:  06/09/21 44.5 kg    BMI:  Body mass index is 14.06 kg/m.  Estimated Nutritional Needs:   Kcal:  1300-1500 kcal  Protein:  70-90 grams  Fluid:  >/= 1.3 L/day    Lucas Mallow, RD, LDN, CNSC Please refer to Amion for contact information.

## 2021-06-10 NOTE — Progress Notes (Signed)
HD#14 Subjective:  No acute events overnight .   Patient seen and evaluated at bedside. She reports that she is doing well today but had an episode of emesis just after breakfast. She reports that she threw up her breakfast shortly after eating. Reports that vomit was predominantly food. No mucus or blood noted per patient. She denies current nausea and was able to eat her lunch of chicken salad. She had not had any fever, chills, no diarrhea.   Objective:  Vital signs in last 24 hours: Vitals:   06/09/21 1148 06/09/21 2133 06/10/21 0032 06/10/21 0629  BP: (!) 120/56 (!) 132/55 (!) 127/58 105/62  Pulse: 60 (!) 58 (!) 52 (!) 58  Resp: _0 Temp: 98 F (36.7 C) 98.2 F (36.8 C) 97.8 F (36.6 C) 98.3 F (36.8 C)  TempSrc: Oral Oral Oral Oral  SpO2: 100% 100% 100% 100%  Weight:      Height:       Supplemental O2: Nasal Cannula SpO2: 100 % O2 Flow Rate (L/min): 5 L/min   Physical Exam:  Constitutional: tremulous and cachectic- appearing elderly woman sitting up in bed.  Cardiovascular: Normal sinus rhythm, regular rate Pulmonary/Chest: normal work of breathing on 3L [place] MSK: poor bulk and tone Neurological: alert & answering questions appropriately   Filed Weights   06/07/21 0412 06/08/21 0433 06/09/21 0535  Weight: 44.1 kg 40.8 kg 44.5 kg     Intake/Output Summary (Last 24 hours) at 06/10/2021 0658 Last data filed at 06/10/2021 0630 Gross per 24 hour  Intake 600 ml  Output 1450 ml  Net -850 ml     Net IO Since Admission: -2,205.97 mL [06/10/21 0658]  Pertinent Labs: CBC Latest Ref Rng & Units 06/01/2021 05/31/2021 05/30/2021  WBC 4.0 - 10.5 K/uL 7.7 8.0 5.9  Hemoglobin 12.0 - 15.0 g/dL 9.1(L) 9.1(L) 9.6(L)  Hematocrit 36.0 - 46.0 % 28.0(L) 28.3(L) 30.2(L)  Platelets 150 - 400 K/uL 426(H) 454(H) 446(H)    CMP Latest Ref Rng & Units 06/10/2021 06/09/2021 06/08/2021  Glucose 70 - 99 mg/dL 93 89 117(H)  BUN 8 - 23 mg/dL _1 Creatinine 0.44 - 1.00 mg/dL  0.61 0.62 0.65  Sodium 135 - 145 mmol/L 127(L) 127(L) 124(L)  Potassium 3.5 - 5.1 mmol/L 4.5 4.1 4.2  Chloride 98 - 111 mmol/L 95(L) 94(L) 91(L)  CO2 22 - 32 mmol/L _2 Calcium 8.9 - 10.3 mg/dL 8.3(L) 8.0(L) 7.9(L)  Total Protein 6.5 - 8.1 g/dL - - -  Total Bilirubin 0.3 - 1.2 mg/dL - - -  Alkaline Phos 38 - 126 U/L - - -  AST 15 - 41 U/L - - -  ALT 0 - 44 U/L - - -    Imaging: No results found.  Assessment/Plan:   Principal Problem:   Aspiration pneumonia ([place]) Active Problems:   Venous insufficiency of both lower extremities   Atrial fibrillation with RVR (HCC)   Protein-calorie malnutrition, severe   CAP (community acquired pneumonia)   Pressure injury of skin   Patient Summary: Kristen [name] is a 75 y.o. with a pertinent PMH of PNA and COPD on home O2, who presented with worsening shortness of breath and chest tightness and admitted for afib with RVR in HR 150s. She has completed a course of antibiotics for aspiration pneumonia and was stable for discharge to SNF, however incidentally tested positive for COVID on anticipated day of discharge.  Transient Hypotension Pressures down to sbp  of 80s and maps of 50s-60s. Symptomatic with dizziness and nausea. EKG unremarkable. Improving with fluids. Likely due to combinatorial effects of hyponatremia of 122, fluid restriction, and amiodarone use in the setting of cachexia. Will monitor hemodynamic stability and fluid resuscitate as needed. - fluid resuscitate as needed - routine vitals  COVID-19 Infection Day of positive test: 7/14, barrier to SNF placement. Pulmonary status unchanged from baseline, saturating appropriately on home oxygen requirement 3L [place]. Continues to remain afebrile. Paxlovid contraindicated due to patient being treated with amiodarone for a-fib. Alternate anti-virals and Ab therapy not clinically indicated at this time as per pharmacy. Given her chronic COPD, and recent recovery from aspiration PNA, she is  at higher risk for COVID-related complications.  - Routine Vitals and  baseline supplemental oxygen 3L [place]; up titrate as needed   Chronic Hyponatremia (Stable) Na 127 stable with salt tablets. Baseline 128-130. No changes in mental status. Hyponatremia studies done in 02/2021 pointed towards low solute state as likely etiology, however most recent exacerbation likely secondary to hypovolemia given low blood pressures recently.  - fluid resuscitate as needed - morning BMP - Free water restriction liberalized to 1800 - Morning CMP - IV Zofran for Nausea - meal encouragement - continue salt tablets   Aspiration Pneumonia (Resolved): Continues to saturate well on baseline 3L [place]. No changes in management. - Aspiration precautions   Atrial Fibrillation with RVR (Resolved) Remains in NSR. Cardiology has signed off. Will continue telemetry monitoring given current COVID 19 infection. - IV Zofran - Oral Amiodarone 200 mg daily - Continue Xarelto 15 mg daily              COPD Exacerbation (Resolved) Patient's respiratory status continues to remain stable on home oxygen 3L [place]. Will continue maintenance regimen.  - Continue home COPD medications (Yupelri, Brovana, Pulmicort) - Continue Duonebs q4 hours PRN - Incentive spirometry/flutter valve   Bilateral lower extremity venous stasis ulcers 2/2 Chronic venous insufficiency Continue daily dressing changes.  Best Practice: Diet: Regular diet IVF: none VTE: Rivaroxaban (XARELTO) tablet 15 mg  Code: DNR PT/OT: Home health PT Family Contact: Notified DISPO: Awaiting SNF placement/ Palliative  Delene Ruffini, PGY1 Pager: [phone] Please contact the on call pager after 5 pm and on weekends at [phone].

## 2021-06-11 LAB — BASIC METABOLIC PANEL
Anion gap: 6 (ref 5–15)
BUN: 11 mg/dL (ref 8–23)
CO2: 27 mmol/L (ref 22–32)
Calcium: 8.4 mg/dL — ABNORMAL LOW (ref 8.9–10.3)
Chloride: 96 mmol/L — ABNORMAL LOW (ref 98–111)
Creatinine, Ser: 0.57 mg/dL (ref 0.44–1.00)
GFR, Estimated: >60 mL/min (ref >60)
Glucose, Bld: 92 mg/dL (ref 70–99)
Potassium: 4.4 mmol/L (ref 3.5–5.1)
Sodium: 129 mmol/L — ABNORMAL LOW (ref 135–145)

## 2021-06-11 LAB — MAGNESIUM: Magnesium: 1.5 mg/dL — ABNORMAL LOW (ref 1.7–2.4)

## 2021-06-11 MED ORDER — MAGNESIUM SULFATE IN D5W 1-5 GM/100ML-% IV SOLN
1.0000 g | Freq: Once | INTRAVENOUS | Status: AC
  Administered 2021-06-11: 1 g via INTRAVENOUS
  Filled 2021-06-11: qty 100

## 2021-06-11 MED ORDER — MAGNESIUM SULFATE 50 % IJ SOLN
1.0000 g | Freq: Once | INTRAMUSCULAR | Status: DC
  Filled 2021-06-11: qty 2

## 2021-06-11 NOTE — TOC Progression Note (Signed)
Transition of Care [facility]) - Progression Note    Patient Details  Name: Kristen [name] MRN: [id number] Date of Birth: 04-Oct-1946  Transition of [facility]) CM/SW [place], [place] Phone Number: 06/11/2021, 11:06 AM  Clinical Narrative:     CSW will continue to follow and assist patient with dc planning needs. [facility] can accept patient on 7/25 if medically ready. Patients insurance Josem Kaufmann was cancelled. CSW will need to restart insurance authorization close to patient being medically ready. CSW will continue to follow for medical readiness.     Expected Discharge Plan: [place] Barriers to Discharge: No Barriers Identified  Expected Discharge Plan and Services Expected Discharge Plan: [place] In-house Referral: Clinical Social Work Discharge Planning Services: CM Consult Post Acute Care Choice: [place] arrangements for the past 2 months: Single Family Home Expected Discharge Date: 06/05/21                 DME Agency: NA                   Social Determinants of Health ([place]) Interventions    Readmission Risk Interventions Readmission Risk Prevention Plan 06/02/2021 09/29/2020  Transportation Screening Complete Complete  PCP or Specialist Appt within 5-7 Days - Complete  Home Care Screening - Complete  Medication Review (RN CM) - Complete  Medication Review Press photographer) Complete -  PCP or Specialist appointment within 3-5 days of discharge Complete -  [place] or Home Care Consult Complete -  SW Recovery Care/Counseling Consult Complete -  Palliative Care Screening Complete -  [place] Complete -  Some recent data might be hidden

## 2021-06-11 NOTE — Progress Notes (Signed)
Occupational Therapy Treatment Patient Details Name: Kristen [name] MRN: [id number] DOB: 07/06/1946 Today's Date: 06/11/2021    History of present illness 75 yo admitted 7/5 with SOB with AFib with RVR. Attempted cardioversion x 3 unsuccessful. Pt +covid(incidental)  on 7/14PMHx: COPD on 3L home O2, chronic Afib, cellulitis, hyponatremia, Chronic venous stasis dermatitis.   OT comments  Pt making progress with ADL goals. This session pt tolerated OOB activities for 10 mins, however became very fatigued afterwards. Pt additionally worked on dressing, bathing, and grooming this session with supervision to min A due to weakness and for safety. Acute OT will continue to follow to assist with ADL performance and functional mobility as needed.    Follow Up Recommendations  SNF    Equipment Recommendations  None recommended by OT    Recommendations for Other Services      Precautions / Restrictions Precautions Precautions: Fall;Other (comment) Precaution Comments: watch vitals, BP has been soft, 3L baseline O2 Restrictions Weight Bearing Restrictions: No       Mobility Bed Mobility Overal bed mobility: Modified Independent             General bed mobility comments: Incr time and effort    Transfers Overall transfer level: Needs assistance Equipment used: Rolling walker (2 wheeled) Transfers: Sit to/from Stand Sit to Stand: Min guard         General transfer comment: Pt able to power up this session, min guard to steady    Balance Overall balance assessment: Needs assistance Sitting-balance support: No upper extremity supported;Feet supported Sitting balance-Leahy Scale: Good     Standing balance support: Bilateral upper extremity supported Standing balance-Leahy Scale: Poor Standing balance comment: walker and min guard for static standing                           ADL either performed or assessed with clinical judgement   ADL Overall ADL's : Needs  assistance/impaired Eating/Feeding: Independent;Sitting Eating/Feeding Details (indicate cue type and reason): Pt eating ice cream and drinking coffee at end of session Grooming: Set up;Sitting Grooming Details (indicate cue type and reason): grooming at EOB Upper Body Bathing: Supervision/ safety;Sitting   Lower Body Bathing: Minimal assistance;Sitting/lateral leans;Sit to/from stand   Upper Body Dressing : Independent;Sitting       Toilet Transfer: Min guard;Ambulation Toilet Transfer Details (indicate cue type and reason): simulated on standard chair in room           General ADL Comments: Pt completed bathing, dressing, and grooming this session all with supervision to min A due to weakness.     Vision   Vision Assessment?: No apparent visual deficits   Perception     Praxis      Cognition Arousal/Alertness: Awake/alert Behavior During Therapy: WFL for tasks assessed/performed Overall Cognitive Status: Within Functional Limits for tasks assessed                                          Exercises     Shoulder Instructions       General Comments VSS on 2L    Pertinent Vitals/ Pain       Pain Assessment: No/denies pain  Home Living  Prior Functioning/Environment              Frequency  Min 2X/week        Progress Toward Goals  OT Goals(current goals can now be found in the care plan section)  Progress towards OT goals: Progressing toward goals  Acute Rehab OT Goals Patient Stated Goal: to get stronger OT Goal Formulation: With patient Time For Goal Achievement: 06/15/21 Potential to Achieve Goals: Good ADL Goals Pt Will Perform Grooming: with modified independence;standing Pt Will Transfer to Toilet: with modified independence;ambulating Pt Will Perform Toileting - Clothing Manipulation and hygiene: with modified independence;sitting/lateral leans;sit to/from  stand Additional ADL Goal #1: Pt will report 3 energy conservation techniques she can use at home.  Plan Discharge plan remains appropriate    Co-evaluation                 AM-PAC OT "6 Clicks" Daily Activity     Outcome Measure   Help from another person eating meals?: None Help from another person taking care of personal grooming?: A Little Help from another person toileting, which includes using toliet, bedpan, or urinal?: A Little Help from another person bathing (including washing, rinsing, drying)?: A Little Help from another person to put on and taking off regular upper body clothing?: None Help from another person to put on and taking off regular lower body clothing?: A Little 6 Click Score: 20    End of Session Equipment Utilized During Treatment: Rolling walker;Oxygen  OT Visit Diagnosis: Unsteadiness on feet (R26.81);Other abnormalities of gait and mobility (R26.89);Muscle weakness (generalized) (M62.81)   Activity Tolerance Patient tolerated treatment well;Patient limited by fatigue   Patient Left in bed;with call bell/phone within reach   Nurse Communication Mobility status        Time: 1834-3735 OT Time Calculation (min): 51 min  Charges: OT General Charges $OT Visit: 1 Visit OT Treatments $Self Care/Home Management : 38-52 mins  Kristen [name] H., OTR/L Acute Rehabilitation  Kristen [name] 06/11/2021, 5:57 PM

## 2021-06-11 NOTE — Progress Notes (Signed)
HD#15 Subjective:  No acute events overnight .   Patient seen and evaluated at bedside. She reports that she is doing well this morning. No acute events overnight. She has not had any episodes of emesis today. No nausea. She has been able to tolerate food. Ambulating well. No cough, CP, or SOB.  Objective:  Vital signs in last 24 hours: Vitals:   06/10/21 0032 06/10/21 0629 06/10/21 1323 06/10/21 1932  BP: (!) 127/58 105/62 113/63 110/62  Pulse: (!) 52 (!) 58 (!) 57 (!) 56  Resp: _0 Temp: 97.8 F (36.6 C) 98.3 F (36.8 C) 98 F (36.7 C) 98.1 F (36.7 C)  TempSrc: Oral Oral  Oral  SpO2: 100% 100% 100% 100%  Weight:      Height:       Supplemental O2: Nasal Cannula SpO2: 100 % O2 Flow Rate (L/min): 4 L/min   Physical Exam:  Constitutional: tremulous and cachectic- appearing elderly woman sitting up in bed.  Cardiovascular: Normal sinus rhythm, regular rate Pulmonary/Chest: normal work of breathing on 3L [place] MSK: poor bulk and tone Neurological: alert & answering questions appropriately   Filed Weights   06/07/21 0412 06/08/21 0433 06/09/21 0535  Weight: 44.1 kg 40.8 kg 44.5 kg     Intake/Output Summary (Last 24 hours) at 06/11/2021 0509 Last data filed at 06/10/2021 1500 Gross per 24 hour  Intake 720 ml  Output 800 ml  Net -80 ml     Net IO Since Admission: -1,485.97 mL [06/11/21 0509]  Pertinent Labs: CBC Latest Ref Rng & Units 06/01/2021 05/31/2021 05/30/2021  WBC 4.0 - 10.5 K/uL 7.7 8.0 5.9  Hemoglobin 12.0 - 15.0 g/dL 9.1(L) 9.1(L) 9.6(L)  Hematocrit 36.0 - 46.0 % 28.0(L) 28.3(L) 30.2(L)  Platelets 150 - 400 K/uL 426(H) 454(H) 446(H)    CMP Latest Ref Rng & Units 06/10/2021 06/09/2021 06/08/2021  Glucose 70 - 99 mg/dL 93 89 117(H)  BUN 8 - 23 mg/dL _1 Creatinine 0.44 - 1.00 mg/dL 0.61 0.62 0.65  Sodium 135 - 145 mmol/L 127(L) 127(L) 124(L)  Potassium 3.5 - 5.1 mmol/L 4.5 4.1 4.2  Chloride 98 - 111 mmol/L 95(L) 94(L) 91(L)  CO2 22 - 32  mmol/L _2 Calcium 8.9 - 10.3 mg/dL 8.3(L) 8.0(L) 7.9(L)  Total Protein 6.5 - 8.1 g/dL - - -  Total Bilirubin 0.3 - 1.2 mg/dL - - -  Alkaline Phos 38 - 126 U/L - - -  AST 15 - 41 U/L - - -  ALT 0 - 44 U/L - - -    Imaging: No results found.  Assessment/Plan:   Principal Problem:   Aspiration pneumonia ([place]) Active Problems:   Venous insufficiency of both lower extremities   Atrial fibrillation with RVR (HCC)   Protein-calorie malnutrition, severe   CAP (community acquired pneumonia)   Pressure injury of skin   Patient Summary: Kristen [name] is a 75 y.o. with a pertinent PMH of PNA and COPD on home O2, who presented with worsening shortness of breath and chest tightness and admitted for afib with RVR in HR 150s. She has completed a course of antibiotics for aspiration pneumonia and was stable for discharge to SNF, however incidentally tested positive for COVID on anticipated day of discharge.  COVID-19 Infection Day of positive test: 7/14, barrier to SNF placement. Pulmonary status unchanged from baseline, saturating appropriately on home oxygen requirement 3L [place]. Continues to remain afebrile. Paxlovid contraindicated due to patient being  treated with amiodarone for a-fib. Alternate anti-virals and Ab therapy not clinically indicated at this time as per pharmacy. Given her chronic COPD, and recent recovery from aspiration PNA, she is at higher risk for COVID-related complications.  - Routine Vitals and  baseline supplemental oxygen 3L [place]; up titrate as needed  Hypomagnesemia 1.5, repleted - monitor   Chronic Hyponatremia (Stable) Na 129 stable with salt tablets. Baseline 128-130. No changes in mental status. Hyponatremia studies done in 02/2021 pointed towards low solute state as likely etiology, however most recent exacerbation likely secondary to hypovolemia given low blood pressures recently.  - fluid resuscitate as needed - morning BMP - Free water restriction  liberalized to 1800 - Morning CMP - IV Zofran for Nausea - meal encouragement - continue salt tablets   Aspiration Pneumonia (Resolved): Continues to saturate well on baseline 3L [place]. No changes in management. - Aspiration precautions   Atrial Fibrillation with RVR (Resolved) Remains in NSR. Cardiology has signed off. Will continue telemetry monitoring given current COVID 19 infection. - IV Zofran - Oral Amiodarone 200 mg daily - Continue Xarelto 15 mg daily              COPD Exacerbation (Resolved) Patient's respiratory status continues to remain stable on home oxygen 3L [place]. Will continue maintenance regimen.  - Continue home COPD medications (Yupelri, Brovana, Pulmicort) - Continue Duonebs q4 hours PRN - Incentive spirometry/flutter valve  Transient Hypotension (resolved) Pressures down to sbp of 80s and maps of 50s-60s. Symptomatic with dizziness and nausea. EKG unremarkable. Improving with fluids. Likely due to combinatorial effects of hyponatremia of 122, fluid restriction, and amiodarone use in the setting of cachexia. Will monitor hemodynamic stability and fluid resuscitate as needed. - fluid resuscitate as needed - routine vitals   Bilateral lower extremity venous stasis ulcers 2/2 Chronic venous insufficiency Continue daily dressing changes.  Best Practice: Diet: Regular diet IVF: none VTE: Rivaroxaban (XARELTO) tablet 15 mg  Code: DNR PT/OT: Home health PT Family Contact: Notified DISPO: Awaiting SNF placement/ Palliative  Delene Ruffini, PGY1 Pager: [phone] Please contact the on call pager after 5 pm and on weekends at [phone].

## 2021-06-12 DIAGNOSIS — J189 Pneumonia, unspecified organism: Secondary | ICD-10-CM | POA: Diagnosis not present

## 2021-06-12 DIAGNOSIS — E46 Unspecified protein-calorie malnutrition: Secondary | ICD-10-CM | POA: Diagnosis not present

## 2021-06-12 DIAGNOSIS — J698 Pneumonitis due to inhalation of other solids and liquids: Secondary | ICD-10-CM | POA: Diagnosis not present

## 2021-06-12 LAB — BASIC METABOLIC PANEL
Anion gap: 5 (ref 5–15)
BUN: 11 mg/dL (ref 8–23)
CO2: 29 mmol/L (ref 22–32)
Calcium: 8.5 mg/dL — ABNORMAL LOW (ref 8.9–10.3)
Chloride: 95 mmol/L — ABNORMAL LOW (ref 98–111)
Creatinine, Ser: 0.64 mg/dL (ref 0.44–1.00)
GFR, Estimated: >60 mL/min (ref >60)
Glucose, Bld: 97 mg/dL (ref 70–99)
Potassium: 4.5 mmol/L (ref 3.5–5.1)
Sodium: 129 mmol/L — ABNORMAL LOW (ref 135–145)

## 2021-06-12 LAB — MAGNESIUM: Magnesium: 1.6 mg/dL — ABNORMAL LOW (ref 1.7–2.4)

## 2021-06-12 MED ORDER — MAGNESIUM SULFATE IN D5W 1-5 GM/100ML-% IV SOLN
1.0000 g | Freq: Once | INTRAVENOUS | Status: AC
  Administered 2021-06-12: 1 g via INTRAVENOUS
  Filled 2021-06-12: qty 100

## 2021-06-12 NOTE — TOC Progression Note (Signed)
Transition of Care [facility]) - Progression Note    Patient Details  Name: Kristen [name] MRN: [id number] Date of Birth: Apr 04, 1946  [facility] CM/SW [place], [place] Phone Number: 06/12/2021, 4:18 PM  Clinical Narrative:     CSW will continue to follow and assist patient with dc planning needs. [facility] can accept patient on 7/25 if medically ready.   Expected Discharge Plan: [place] Barriers to Discharge: No Barriers Identified  Expected Discharge Plan and Services Expected Discharge Plan: [place] In-house Referral: Clinical Social Work Discharge Planning Services: CM Consult Post Acute Care Choice: [place] arrangements for the past 2 months: Single Family Home Expected Discharge Date: 06/05/21                 DME Agency: NA                   Social Determinants of Health ([place]) Interventions    Readmission Risk Interventions Readmission Risk Prevention Plan 06/02/2021 09/29/2020  Transportation Screening Complete Complete  PCP or Specialist Appt within 5-7 Days - Complete  Home Care Screening - Complete  Medication Review (RN CM) - Complete  Medication Review Press photographer) Complete -  PCP or Specialist appointment within 3-5 days of discharge Complete -  [place] or Home Care Consult Complete -  SW Recovery Care/Counseling Consult Complete -  Palliative Care Screening Complete -  [place] Complete -  Some recent data might be hidden

## 2021-06-12 NOTE — Plan of Care (Signed)
  Problem: Education: Goal: Knowledge of General Education information will improve Description: Including pain rating scale, medication(s)/side effects and non-pharmacologic comfort measures Outcome: Progressing

## 2021-06-12 NOTE — Progress Notes (Signed)
HD#16 Subjective:  No acute events overnight .   Patient seen and evaluated at bedside. She reports that she is doing well this morning. No acute events overnight. She has not had any episodes of emesis today. Continued nausea. She has been able to tolerate food. No cough, CP, or SOB.  Objective:  Vital signs in last 24 hours: Vitals:   06/11/21 1400 06/11/21 2100 06/12/21 0100 06/12/21 0508  BP: (!) 110/56 121/70 (!) 92/50 (!) 95/55  Pulse: 63 61 67 (!) 56  Resp: _0 Temp: 97.6 F (36.4 C) 98 F (36.7 C) 97.6 F (36.4 C) 97.7 F (36.5 C)  TempSrc: Oral Oral Oral Oral  SpO2: 100% 96% 99% 96%  Weight:    41.7 kg  Height:       Supplemental O2: Nasal Cannula SpO2: 96 % O2 Flow Rate (L/min): 4 L/min   Physical Exam:  Constitutional: tremulous and cachectic- appearing elderly woman sitting up in bed.  Cardiovascular: Normal sinus rhythm, regular rate Pulmonary/Chest: normal work of breathing on 3L [place] MSK: poor bulk and tone Neurological: alert & answering questions appropriately   Filed Weights   06/08/21 0433 06/09/21 0535 06/12/21 0508  Weight: 40.8 kg 44.5 kg 41.7 kg     Intake/Output Summary (Last 24 hours) at 06/12/2021 0725 Last data filed at 06/12/2021 0443 Gross per 24 hour  Intake --  Output 2625 ml  Net -2625 ml     Net IO Since Admission: -4,110.97 mL [06/12/21 0725]  Pertinent Labs: CBC Latest Ref Rng & Units 06/01/2021 05/31/2021 05/30/2021  WBC 4.0 - 10.5 K/uL 7.7 8.0 5.9  Hemoglobin 12.0 - 15.0 g/dL 9.1(L) 9.1(L) 9.6(L)  Hematocrit 36.0 - 46.0 % 28.0(L) 28.3(L) 30.2(L)  Platelets 150 - 400 K/uL 426(H) 454(H) 446(H)    CMP Latest Ref Rng & Units 06/12/2021 06/11/2021 06/10/2021  Glucose 70 - 99 mg/dL 97 92 93  BUN 8 - 23 mg/dL _1 Creatinine 0.44 - 1.00 mg/dL 0.64 0.57 0.61  Sodium 135 - 145 mmol/L 129(L) 129(L) 127(L)  Potassium 3.5 - 5.1 mmol/L 4.5 4.4 4.5  Chloride 98 - 111 mmol/L 95(L) 96(L) 95(L)  CO2 22 - 32 mmol/L _2 Calcium 8.9 - 10.3 mg/dL 8.5(L) 8.4(L) 8.3(L)  Total Protein 6.5 - 8.1 g/dL - - -  Total Bilirubin 0.3 - 1.2 mg/dL - - -  Alkaline Phos 38 - 126 U/L - - -  AST 15 - 41 U/L - - -  ALT 0 - 44 U/L - - -    Imaging: No results found.  Assessment/Plan:   Principal Problem:   Aspiration pneumonia ([place]) Active Problems:   Venous insufficiency of both lower extremities   Atrial fibrillation with RVR (HCC)   Protein-calorie malnutrition, severe   CAP (community acquired pneumonia)   Pressure injury of skin   Patient Summary: Kristen [name] is a 75 y.o. with a pertinent PMH of PNA and COPD on home O2, who presented with worsening shortness of breath and chest tightness and admitted for afib with RVR in HR 150s. She has completed a course of antibiotics for aspiration pneumonia and was stable for discharge to SNF, however incidentally tested positive for COVID on anticipated day of discharge.  COVID-19 Infection Day of positive test: 7/14, barrier to SNF placement. Pulmonary status unchanged from baseline, saturating appropriately on home oxygen requirement 3L [place]. Continues to remain afebrile. Paxlovid contraindicated due to patient being treated with amiodarone  for a-fib. Alternate anti-virals and Ab therapy not clinically indicated at this time as per pharmacy. Given her chronic COPD, and recent recovery from aspiration PNA, she is at higher risk for COVID-related complications.  - Routine Vitals and  baseline supplemental oxygen 3L [place]; up titrate as needed  Hypomagnesemia 1.5, repleted - monitor   Chronic Hyponatremia (Stable) Na 129 stable with salt tablets. Baseline 128-130. No changes in mental status. Hyponatremia studies done in 02/2021 pointed towards low solute state as likely etiology, however most recent exacerbation likely secondary to hypovolemia given low blood pressures recently.  - fluid resuscitate as needed - morning BMP - Free water restriction liberalized to 1800 -  Morning CMP - IV Zofran for Nausea - meal encouragement - continue salt tablets   Aspiration Pneumonia (Resolved): Continues to saturate well on baseline 3L [place]. No changes in management. - Aspiration precautions   Atrial Fibrillation with RVR (Resolved) Remains in NSR. Cardiology has signed off. Will continue telemetry monitoring given current COVID 19 infection. - IV Zofran - Oral Amiodarone 200 mg daily - Continue Xarelto 15 mg daily              COPD Exacerbation (Resolved) Patient's respiratory status continues to remain stable on home oxygen 3L [place]. Will continue maintenance regimen.  - Continue home COPD medications (Yupelri, Brovana, Pulmicort) - Continue Duonebs q4 hours PRN - Incentive spirometry/flutter valve  Transient Hypotension (resolved) Pressures down to sbp of 80s and maps of 50s-60s. Symptomatic with dizziness and nausea. EKG unremarkable. Improving with fluids. Likely due to combinatorial effects of hyponatremia of 122, fluid restriction, and amiodarone use in the setting of cachexia. Will monitor hemodynamic stability and fluid resuscitate as needed. - fluid resuscitate as needed - routine vitals  Sepsis/septic shock (resolved) - patient was admitted for aspiration pneumonia. Sepsis was ruled in based on findings of hypotension, tachycardia, leukocytosis, and elevated lactic acid in the setting of aspiration pneumonia.  This has now resolved.   Bilateral lower extremity venous stasis ulcers 2/2 Chronic venous insufficiency Continue daily dressing changes.  Best Practice: Diet: Regular diet IVF: none VTE: Rivaroxaban (XARELTO) tablet 15 mg  Code: DNR PT/OT: Home health PT Family Contact: Notified DISPO: Medically stable for discharge. SNF placement  Delene Ruffini, PGY1 Pager: [phone] Please contact the on call pager after 5 pm and on weekends at [phone].

## 2021-06-12 NOTE — Progress Notes (Signed)
Physical Therapy Treatment Patient Details Name: Kristen [name] MRN: [id number] DOB: 1946-06-13 Today's Date: 06/12/2021    History of Present Illness 75 yo admitted 7/5 with SOB with AFib with RVR. Attempted cardioversion x 3 unsuccessful. Pt +covid(incidental)  on 7/14PMHx: COPD on 3L home O2, chronic Afib, cellulitis, hyponatremia, Chronic venous stasis dermatitis.    PT Comments    Pt progressing in session today, tolerating ambulation of increased distances and without physical assistance. Pt continues to be limited by fatigue but expressing desire to tolerate more activity. Pt continues to demonstrate generalized weakness and reduced tolerance to activity and will benefit from continue acute PT to improve independence in functional mobility and gait and return to prior level.   Follow Up Recommendations  SNF     Equipment Recommendations  None recommended by PT    Recommendations for Other Services       Precautions / Restrictions Precautions Precautions: Fall;Other (comment) Precaution Comments: watch vitals, BP has been soft, 3L baseline O2 Restrictions Weight Bearing Restrictions: No    Mobility  Bed Mobility Overal bed mobility: Needs Assistance Bed Mobility: Supine to Sit     Supine to sit: Min guard Sit to supine: Min assist   General bed mobility comments: min A to return to supine    Transfers Overall transfer level: Needs assistance Equipment used: Rolling walker (2 wheeled) Transfers: Sit to/from Stand Sit to Stand: Min guard (2x)         General transfer comment: pt given cues for hand placement to stand, slow to rise and min G for safety  Ambulation/Gait Ambulation/Gait assistance: Min guard Gait Distance (Feet): 80 Feet (20 ft, seated rest break and additional 60 ft) Assistive device: Rolling walker (2 wheeled) Gait Pattern/deviations: Step-through pattern;Trunk flexed;Decreased stride length Gait velocity: decreased\ Gait velocity  interpretation: 1.31 - 2.62 ft/sec, indicative of limited community ambulator General Gait Details: pt with slow step-through gait, limited by fatigue but reporting desire to be able to tolerate more activity   Stairs             Wheelchair Mobility    Modified Rankin (Stroke Patients Only)       Balance Overall balance assessment: Needs assistance Sitting-balance support: No upper extremity supported Sitting balance-Leahy Scale: Good Sitting balance - Comments: pt able to don shoes   Standing balance support: Bilateral upper extremity supported Standing balance-Leahy Scale: Poor Standing balance comment: pt reliant on bil UE support                            Cognition Arousal/Alertness: Awake/alert Behavior During Therapy: WFL for tasks assessed/performed Overall Cognitive Status: Within Functional Limits for tasks assessed                                        Exercises      General Comments General comments (skin integrity, edema, etc.): Pt agreeable to PT this afternoon, reporting improvements in  nausea from earlier this morning.      Pertinent Vitals/Pain Pain Assessment: No/denies pain    Home Living                      Prior Function            PT Goals (current goals can now be found in the care plan section) Acute Rehab PT Goals  Patient Stated Goal: to get stronger Progress towards PT goals: Progressing toward goals    Frequency    Min 2X/week      PT Plan Current plan remains appropriate    Co-evaluation              AM-PAC PT "6 Clicks" Mobility   Outcome Measure  Help needed turning from your back to your side while in a flat bed without using bedrails?: None Help needed moving from lying on your back to sitting on the side of a flat bed without using bedrails?: None Help needed moving to and from a bed to a chair (including a wheelchair)?: A Little Help needed standing up from a  chair using your arms (e.g., wheelchair or bedside chair)?: A Little Help needed to walk in hospital room?: A Little Help needed climbing 3-5 steps with a railing? : A Little 6 Click Score: 20    End of Session Equipment Utilized During Treatment: Gait belt;Oxygen Activity Tolerance: Patient limited by fatigue Patient left: with bed alarm set;in bed;with call bell/phone within reach Nurse Communication: Mobility status PT Visit Diagnosis: Difficulty in walking, not elsewhere classified (R26.2);Other abnormalities of gait and mobility (R26.89);Muscle weakness (generalized) (M62.81)     Time: 0947-0962 PT Time Calculation (min) (ACUTE ONLY): 25 min  Charges:  $Therapeutic Activity: 23-37 mins                     Acute Rehab  Pager: [phone]    Garwin Brothers, SPT  06/12/2021, 4:46 PM

## 2021-06-13 LAB — BASIC METABOLIC PANEL
Anion gap: 7 (ref 5–15)
BUN: 10 mg/dL (ref 8–23)
CO2: 27 mmol/L (ref 22–32)
Calcium: 8.8 mg/dL — ABNORMAL LOW (ref 8.9–10.3)
Chloride: 94 mmol/L — ABNORMAL LOW (ref 98–111)
Creatinine, Ser: 0.73 mg/dL (ref 0.44–1.00)
GFR, Estimated: >60 mL/min (ref >60)
Glucose, Bld: 103 mg/dL — ABNORMAL HIGH (ref 70–99)
Potassium: 4.4 mmol/L (ref 3.5–5.1)
Sodium: 128 mmol/L — ABNORMAL LOW (ref 135–145)

## 2021-06-13 LAB — CBC
HCT: 27.6 % — ABNORMAL LOW (ref 36.0–46.0)
Hemoglobin: 8.7 g/dL — ABNORMAL LOW (ref 12.0–15.0)
MCH: 28.9 pg (ref 26.0–34.0)
MCHC: 31.5 g/dL (ref 30.0–36.0)
MCV: 91.7 fL (ref 80.0–100.0)
Platelets: 533 10*3/uL — ABNORMAL HIGH (ref 150–400)
RBC: 3.01 MIL/uL — ABNORMAL LOW (ref 3.87–5.11)
RDW: 14 % (ref 11.5–15.5)
WBC: 6.1 10*3/uL (ref 4.0–10.5)
nRBC: 0 % (ref 0.0–0.2)

## 2021-06-13 LAB — MAGNESIUM: Magnesium: 1.6 mg/dL — ABNORMAL LOW (ref 1.7–2.4)

## 2021-06-13 MED ORDER — MAGNESIUM SULFATE IN D5W 1-5 GM/100ML-% IV SOLN
1.0000 g | Freq: Once | INTRAVENOUS | Status: AC
  Administered 2021-06-13: 1 g via INTRAVENOUS
  Filled 2021-06-13: qty 100

## 2021-06-13 MED ORDER — MAGNESIUM SULFATE 50 % IJ SOLN: 1.0000 g | Freq: Once | INTRAMUSCULAR | Status: DC

## 2021-06-13 NOTE — Care Management Important Message (Signed)
Important Message  Patient Details  Name: Machele Deihl MRN: [id number] Date of Birth: 08/28/1946   Medicare Important Message Given:  Yes     Shelda Altes 06/13/2021, 12:03 PM

## 2021-06-13 NOTE — TOC Progression Note (Signed)
Transition of Care [facility]) - Progression Note    Patient Details  Name: Kristen [name] MRN: [id number] Date of Birth: [birth year]  Transition of Care [facility]) CM/SW [place], [place] Phone Number: 06/13/2021, 10:23 AM  Clinical Narrative:     CSW followed up with Tressa Busman with [facility]. If patient is medically ready on Monday 7/25 Tressa Busman confirmed they can accept patient for SNF placement. Weekend CSW will follow to start insurance auth for patient if looking like patient will be medically ready on Monday. CSW will continue to follow and assist with dc planning needs.  Expected Discharge Plan: [place] Barriers to Discharge: No Barriers Identified  Expected Discharge Plan and Services Expected Discharge Plan: [place] In-house Referral: Clinical Social Work Discharge Planning Services: CM Consult Post Acute Care Choice: [place] arrangements for the past 2 months: Single Family Home Expected Discharge Date: 06/05/21                 DME Agency: NA                   Social Determinants of Health ([place]) Interventions    Readmission Risk Interventions Readmission Risk Prevention Plan 06/02/2021 09/29/2020  Transportation Screening Complete Complete  PCP or Specialist Appt within 5-7 Days - Complete  Home Care Screening - Complete  Medication Review (RN CM) - Complete  Medication Review [employer]) Complete -  PCP or Specialist appointment within 3-5 days of discharge Complete -  [place] or Home Care Consult Complete -  SW Recovery Care/Counseling Consult Complete -  Palliative Care Screening Complete -  Icard Complete -  Some recent data might be hidden

## 2021-06-13 NOTE — Progress Notes (Signed)
HD#17 Subjective:  No acute events overnight .   Patient seen and evaluated at bedside. She reports that she is doing well this morning. No acute events overnight. Reports some nausea after taking her morning medications. Believes the sodium pill is to blame. She has been able to tolerate food. No cough, CP, or SOB.  Objective:  Vital signs in last 24 hours: Vitals:   06/12/21 2328 06/13/21 0500 06/13/21 0548 06/13/21 0927  BP:   (!) 110/59 (!) 106/55  Pulse: (!) 58  (!) 55 60  Resp: _0 Temp:   (!) 97.5 F (36.4 C) 97.9 F (36.6 C)  TempSrc:   Oral Oral  SpO2: 100%  99% 98%  Weight:  43.8 kg    Height:       Supplemental O2: Nasal Cannula SpO2: 98 % O2 Flow Rate (L/min): 4 L/min   Physical Exam:  Constitutional: tremulous and cachectic- appearing elderly woman sitting up in bed.  Cardiovascular: Normal sinus rhythm, regular rate Pulmonary/Chest: normal work of breathing on 3L [place] MSK: poor bulk and tone Neurological: alert & answering questions appropriately   Filed Weights   06/09/21 0535 06/12/21 0508 06/13/21 0500  Weight: 44.5 kg 41.7 kg 43.8 kg     Intake/Output Summary (Last 24 hours) at 06/13/2021 1526 Last data filed at 06/13/2021 0558 Gross per 24 hour  Intake --  Output 2350 ml  Net -2350 ml     Net IO Since Admission: -7,260.97 mL [06/13/21 1526]  Pertinent Labs: CBC Latest Ref Rng & Units 06/13/2021 06/01/2021 05/31/2021  WBC 4.0 - 10.5 K/uL 6.1 7.7 8.0  Hemoglobin 12.0 - 15.0 g/dL 8.7(L) 9.1(L) 9.1(L)  Hematocrit 36.0 - 46.0 % 27.6(L) 28.0(L) 28.3(L)  Platelets 150 - 400 K/uL 533(H) 426(H) 454(H)    CMP Latest Ref Rng & Units 06/13/2021 06/12/2021 06/11/2021  Glucose 70 - 99 mg/dL 103(H) 97 92  BUN 8 - 23 mg/dL _1 Creatinine 0.44 - 1.00 mg/dL 0.73 0.64 0.57  Sodium 135 - 145 mmol/L 128(L) 129(L) 129(L)  Potassium 3.5 - 5.1 mmol/L 4.4 4.5 4.4  Chloride 98 - 111 mmol/L 94(L) 95(L) 96(L)  CO2 22 - 32 mmol/L _2 Calcium 8.9  - 10.3 mg/dL 8.8(L) 8.5(L) 8.4(L)  Total Protein 6.5 - 8.1 g/dL - - -  Total Bilirubin 0.3 - 1.2 mg/dL - - -  Alkaline Phos 38 - 126 U/L - - -  AST 15 - 41 U/L - - -  ALT 0 - 44 U/L - - -    Imaging: No results found.  Assessment/Plan:   Principal Problem:   Aspiration pneumonia ([place]) Active Problems:   Venous insufficiency of both lower extremities   Atrial fibrillation with RVR (HCC)   Protein-calorie malnutrition, severe   CAP (community acquired pneumonia)   Pressure injury of skin   Patient Summary: Kristen [name] is a 75 y.o. with a pertinent PMH of PNA and COPD on home O2, who presented with worsening shortness of breath and chest tightness and admitted for afib with RVR in HR 150s. She has completed a course of antibiotics for aspiration pneumonia and was stable for discharge to SNF, however incidentally tested positive for COVID on anticipated day of discharge.  COVID-19 Infection Day of positive test: 7/14, barrier to SNF placement. Pulmonary status unchanged from baseline, saturating appropriately on home oxygen requirement 3L [place]. Continues to remain afebrile. Paxlovid contraindicated due to patient being treated with amiodarone for  a-fib. Alternate anti-virals and Ab therapy not clinically indicated at this time as per pharmacy. Given her chronic COPD, and recent recovery from aspiration PNA, she is at higher risk for COVID-related complications.  - Routine Vitals and  baseline supplemental oxygen 3L [place]; up titrate as needed  Hypomagnesemia 1.6, repleted - monitor   Chronic Hyponatremia (Stable) Na 128 stable with salt tablets. Baseline 128-130. No changes in mental status. Hyponatremia studies done in 02/2021 pointed towards low solute state as likely etiology, however most recent exacerbation likely secondary to hypovolemia given low blood pressures recently.  - fluid resuscitate as needed - morning BMP - Free water restriction liberalized to 1800 - Morning CMP - IV  Zofran for Nausea - meal encouragement - continue salt tablets   Aspiration Pneumonia (Resolved): Continues to saturate well on baseline 3L [place]. No changes in management. - Aspiration precautions   Atrial Fibrillation with RVR (Resolved) Remains in NSR. Cardiology has signed off. Will continue telemetry monitoring given current COVID 19 infection. - IV Zofran - Oral Amiodarone 200 mg daily - Continue Xarelto 15 mg daily              COPD Exacerbation (Resolved) Patient's respiratory status continues to remain stable on home oxygen 3L [place]. Will continue maintenance regimen.  - Continue home COPD medications (Yupelri, Brovana, Pulmicort) - Continue Duonebs q4 hours PRN - Incentive spirometry/flutter valve  Transient Hypotension (resolved) Pressures down to sbp of 80s and maps of 50s-60s. Symptomatic with dizziness and nausea. EKG unremarkable. Improving with fluids. Likely due to combinatorial effects of hyponatremia of 122, fluid restriction, and amiodarone use in the setting of cachexia. Will monitor hemodynamic stability and fluid resuscitate as needed. - fluid resuscitate as needed - routine vitals  Sepsis/septic shock (resolved) - patient was admitted for aspiration pneumonia. Sepsis was ruled in based on findings of hypotension, tachycardia, leukocytosis, and elevated lactic acid in the setting of aspiration pneumonia.  This has now resolved.   Bilateral lower extremity venous stasis ulcers 2/2 Chronic venous insufficiency Continue daily dressing changes.  Best Practice: Diet: Regular diet IVF: none VTE: Rivaroxaban (XARELTO) tablet 15 mg  Code: DNR PT/OT: Home health PT Family Contact: Notified DISPO: Medically stable for discharge. SNF placement  Delene Ruffini, PGY1 Pager: [phone] Please contact the on call pager after 5 pm and on weekends at [phone].

## 2021-06-13 NOTE — Plan of Care (Signed)
  Problem: Education: Goal: Knowledge of General Education information will improve Description: Including pain rating scale, medication(s)/side effects and non-pharmacologic comfort measures Outcome: Progressing

## 2021-06-14 LAB — MAGNESIUM: Magnesium: 1.8 mg/dL (ref 1.7–2.4)

## 2021-06-14 NOTE — Progress Notes (Signed)
HD#18 Subjective:  No acute events overnight .   Patient seen and evaluated at bedside. She says she is doing well. She reports that she was able to eat her entire breakfast this morning. Minimal nausea. Did not require zofran.  Objective:  Vital signs in last 24 hours: Vitals:   06/13/21 2048 06/13/21 2256 06/14/21 0224 06/14/21 0616  BP: (!) 100/55 (!) 107/57 (!) 97/51 (!) 112/59  Pulse: (!) 59 (!) 59 61 (!) 56  Resp: _0 Temp: 97.7 F (36.5 C) 97.7 F (36.5 C)  97.6 F (36.4 C)  TempSrc: Oral Oral  Oral  SpO2: 100% 100% 100% 100%  Weight:    44 kg  Height:       Supplemental O2: Nasal Cannula SpO2: 100 % O2 Flow Rate (L/min): 4 L/min   Physical Exam:  Constitutional: tremulous and cachectic- appearing elderly woman sitting up in bed.  Cardiovascular: Normal sinus rhythm, regular rate Pulmonary/Chest: normal work of breathing on 3L [place] MSK: poor bulk and tone Neurological: alert & answering questions appropriately   Filed Weights   06/12/21 0508 06/13/21 0500 06/14/21 0616  Weight: 41.7 kg 43.8 kg 44 kg     Intake/Output Summary (Last 24 hours) at 06/14/2021 1313 Last data filed at 06/14/2021 0650 Gross per 24 hour  Intake --  Output 800 ml  Net -800 ml     Net IO Since Admission: -8,060.97 mL [06/14/21 1313]  Pertinent Labs: CBC Latest Ref Rng & Units 06/13/2021 06/01/2021 05/31/2021  WBC 4.0 - 10.5 K/uL 6.1 7.7 8.0  Hemoglobin 12.0 - 15.0 g/dL 8.7(L) 9.1(L) 9.1(L)  Hematocrit 36.0 - 46.0 % 27.6(L) 28.0(L) 28.3(L)  Platelets 150 - 400 K/uL 533(H) 426(H) 454(H)    CMP Latest Ref Rng & Units 06/13/2021 06/12/2021 06/11/2021  Glucose 70 - 99 mg/dL 103(H) 97 92  BUN 8 - 23 mg/dL _1 Creatinine 0.44 - 1.00 mg/dL 0.73 0.64 0.57  Sodium 135 - 145 mmol/L 128(L) 129(L) 129(L)  Potassium 3.5 - 5.1 mmol/L 4.4 4.5 4.4  Chloride 98 - 111 mmol/L 94(L) 95(L) 96(L)  CO2 22 - 32 mmol/L _2 Calcium 8.9 - 10.3 mg/dL 8.8(L) 8.5(L) 8.4(L)  Total  Protein 6.5 - 8.1 g/dL - - -  Total Bilirubin 0.3 - 1.2 mg/dL - - -  Alkaline Phos 38 - 126 U/L - - -  AST 15 - 41 U/L - - -  ALT 0 - 44 U/L - - -    Imaging: No results found.  Assessment/Plan:   Principal Problem:   Aspiration pneumonia ([place]) Active Problems:   Venous insufficiency of both lower extremities   Atrial fibrillation with RVR (HCC)   Protein-calorie malnutrition, severe   CAP (community acquired pneumonia)   Pressure injury of skin   Patient Summary: Kristen [name] is a 75 y.o. with a pertinent PMH of PNA and COPD on home O2, who presented with worsening shortness of breath and chest tightness and admitted for afib with RVR in HR 150s. She has completed a course of antibiotics for aspiration pneumonia and was stable for discharge to SNF, however incidentally tested positive for COVID on anticipated day of discharge.  COVID-19 Infection Day of positive test: 7/14, barrier to SNF placement. Pulmonary status unchanged from baseline, saturating appropriately on home oxygen requirement 3L . Continues to remain afebrile. Paxlovid contraindicated due to patient being treated with amiodarone for a-fib. Alternate anti-virals and Ab therapy not clinically indicated at  this time as per pharmacy. Given her chronic COPD, and recent recovery from aspiration PNA, she is at higher risk for COVID-related complications.  - Routine Vitals and  baseline supplemental oxygen 3L [place]; up titrate as needed  Hypomagnesemia 1.8, repleted - monitor   Chronic Hyponatremia (Stable) Na 128 stable with salt tablets. Baseline 128-130. No changes in mental status. Hyponatremia studies done in 02/2021 pointed towards low solute state as likely etiology, however most recent exacerbation likely secondary to hypovolemia given low blood pressures recently.  - fluid resuscitate as needed - morning BMP - Free water restriction liberalized to 1800 - Morning CMP - IV Zofran for Nausea - meal  encouragement - continue salt tablets   Aspiration Pneumonia (Resolved): Continues to saturate well on baseline 3L [place]. No changes in management. - Aspiration precautions   Atrial Fibrillation with RVR (Resolved) Remains in NSR. Cardiology has signed off. Will continue telemetry monitoring given current COVID 19 infection. - IV Zofran - Oral Amiodarone 200 mg daily - Continue Xarelto 15 mg daily              COPD Exacerbation (Resolved) Patient's respiratory status continues to remain stable on home oxygen 3L . Will continue maintenance regimen.  - Continue home COPD medications (Yupelri, Brovana, Pulmicort) - Continue Duonebs q4 hours PRN - Incentive spirometry/flutter valve  Transient Hypotension (resolved) Pressures down to sbp of 80s and maps of 50s-60s. Symptomatic with dizziness and nausea. EKG unremarkable. Improving with fluids. Likely due to combinatorial effects of hyponatremia of 122, fluid restriction, and amiodarone use in the setting of cachexia. Will monitor hemodynamic stability and fluid resuscitate as needed. - fluid resuscitate as needed - routine vitals  Sepsis/septic shock (resolved) - patient was admitted for aspiration pneumonia. Sepsis was ruled in based on findings of hypotension, tachycardia, leukocytosis, and elevated lactic acid in the setting of aspiration pneumonia.  This has now resolved.   Bilateral lower extremity venous stasis ulcers 2/2 Chronic venous insufficiency Continue daily dressing changes.  Best Practice: Diet: Regular diet IVF: none VTE: Rivaroxaban (XARELTO) tablet 15 mg  Code: DNR PT/OT: Home health PT Family Contact: Notified DISPO: Medically stable for discharge. SNF placement  Delene Ruffini, PGY1 Pager: [phone] Please contact the on call pager after 5 pm and on weekends at [phone].

## 2021-06-14 NOTE — Progress Notes (Deleted)
Cardiology Office Note:    Date:  06/14/2021   ID:  Kristen [name], DOB February 25, 1946, MRN [id number]  PCP:  [name], MD   [facility] Providers Cardiologist:  Freada Bergeron, MD { Click to update primary MD,subspecialty MD or APP then REFRESH:1}    Referring MD: [name], MD    History of Present Illness:    Kristen [name] is a 75 y.o. female with a hx of paroxysmal atrial fibrillation diagnosed in January 2022 on Xarelto, tobacco abuse, COPD on home O2, bilateral ankle wound, chronic venous stasis dermatitis, and hyponatremia who presents to clinic for follow-up.  Patient was initially seen in [facility] hospital when she was admitted from 05/27/21 to 06/05/21 with aspiration PNA, COPD, and Afib with RVR. During admission, DCCV attempted in the ER x3 without success. Was placed on amiodarone gtt as did not tolerate metop due to hypotension. Ultimately, she converted to NSR and was placed on amiodarone load and continued on her home xarelto for CHADs-vasc 4. She now presents to clinic for follow-up.  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     Allergies:   Gabapentin   Social History   Socioeconomic History   Marital status: Divorced    Spouse name: Not on file   Number of children: Not on file   Years of education: Not on file   Highest education level: Not on file  Occupational History    Comment: RETIRED  Tobacco Use   Smoking status: Former    Packs/day: 0.25    Types: Cigarettes     Quit date: 10/16/2019    Years since quitting: 1.6   Smokeless tobacco: Never   Tobacco comments:    2-3 per day   Vaping Use   Vaping Use: Never used  Substance and Sexual Activity   Alcohol use: Yes    Comment: occasionally   Drug use: Never   Sexual activity: Not on file  Other Topics Concern   Not on file  Social History Narrative   Not on file   Social Determinants of Health   Financial Resource Strain: Not on file  Food Insecurity: Not on file  Transportation Needs: Not on file  Physical Activity: Not on file  Stress: Not on file  Social Connections: Not on file     Family History: The patient's ***family history includes Diabetes in her father and mother; Diabetes Mellitus II in her mother; Diverticulitis in her sister.  ROS:   Please see the history of present illness.    *** All other systems reviewed and are negative.  EKGs/Labs/Other Studies Reviewed:    The following studies were reviewed today: Echo 10/23/2020 1. Left ventricular ejection fraction, by estimation, is 55 to 60%. The  left ventricle has normal function. The left ventricle has no regional  wall motion abnormalities. Left ventricular diastolic parameters are  consistent with Grade I diastolic  dysfunction (impaired relaxation). There is the interventricular septum is  flattened in  diastole ('D' shaped left ventricle), consistent with right  ventricular volume overload.   2. Right ventricular systolic function is low normal. The right  ventricular size is normal.   3. The mitral valve is grossly normal. No evidence of mitral valve  regurgitation.   4. The aortic valve is tricuspid. There is mild calcification of the  aortic valve. There is mild thickening of the aortic valve. Aortic valve  regurgitation is not visualized.   5. The inferior vena cava is normal in size with greater than 50%  respiratory variability, suggesting right atrial pressure of 3 mmHg.      EKG:  EKG is *** ordered  today.  The ekg ordered today demonstrates ***  Recent Labs: 05/27/2021: B Natriuretic Peptide 526.6 05/30/2021: ALT 9 06/03/2021: TSH 5.906 06/13/2021: BUN 10; Creatinine, Ser 0.73; Hemoglobin 8.7; Platelets 533; Potassium 4.4; Sodium 128 06/14/2021: Magnesium 1.8  Recent Lipid Panel No results found for: CHOL, TRIG, HDL, CHOLHDL, VLDL, LDLCALC, LDLDIRECT   Risk Assessment/Calculations:   {Does this patient have ATRIAL FIBRILLATION?:[id number]}       Physical Exam:    VS:  There were no vitals taken for this visit.    Wt Readings from Last 3 Encounters:  06/14/21 97 lb (44 kg)  05/05/21 90 lb 4.8 oz (41 kg)  03/07/21 95 lb 14.4 oz (43.5 kg)     GEN: *** Well nourished, well developed in no acute distress HEENT: Normal NECK: No JVD; No carotid bruits LYMPHATICS: No lymphadenopathy CARDIAC: ***RRR, no murmurs, rubs, gallops RESPIRATORY:  Clear to auscultation without rales, wheezing or rhonchi  ABDOMEN: Soft, non-tender, non-distended MUSCULOSKELETAL:  No edema; No deformity  SKIN: Warm and dry NEUROLOGIC:  Alert and oriented x 3 PSYCHIATRIC:  Normal affect   ASSESSMENT:    No diagnosis found. PLAN:    In order of problems listed above:  #Atrial fibrillation with RVR: CHADs-vasc 4. Has known diagnosis of Afib that was initially diagnosed during a hospitalization in 11/2020 at which point she was started on metop and xarelto. During admission from 05/27/21-06/05/21, patient presented with Afib with RVR in the setting of septic shock. Attempted DCCV x3 in the ER without success and therefore placed on amiodarone gtt. She eventually was transitioned to PO amiodarone and resumed on home xarelto. -Continue amiodarone 236m daily -Continue xarelto 146mdaily -Continue metop 2565mL daily   #COPD #Chronic Tobacco Use: -Management per pulmonary   {Are you ordering a CV Procedure (e.g. stress test, cath, DCCV, TEE, etc)?   Press F2        :[id number]} Medication  Adjustments/Labs and Tests Ordered: Current medicines are reviewed at length with the patient today.  Concerns regarding medicines are outlined above.  No orders of the defined types were placed in this encounter.  No orders of the defined types were placed in this encounter.   There are no Patient Instructions on file for this visit.   Signed, HeaFreada BergeronD  06/14/2021 3:57 PM    [place]

## 2021-06-15 MED ORDER — MAGNESIUM SULFATE 2 GM/50ML IV SOLN
2.0000 g | Freq: Once | INTRAVENOUS | Status: AC
  Administered 2021-06-15: 2 g via INTRAVENOUS
  Filled 2021-06-15: qty 50

## 2021-06-15 MED ORDER — RAMELTEON 8 MG PO TABS
8.0000 mg | ORAL_TABLET | Freq: Every day | ORAL | Status: DC
  Administered 2021-06-15 – 2021-06-16 (×2): 8 mg via ORAL
  Filled 2021-06-15 (×3): qty 1

## 2021-06-15 NOTE — TOC Progression Note (Signed)
Transition of Care [facility]) - Progression Note    Patient Details  Name: Kristen [name] MRN: [id number] Date of Birth: [birth year]  Transition of Care [facility]) CM/SW [place], [place] Phone Number: [phone] 06/15/2021, 12:15 PM  Clinical Narrative:     CSW spoke with Tressa Busman at [facility] and he confirmed that pt would not need an updated COVID due to testing positive as long as she does not have any symptoms. He also explained that she would need dates of her COVID testing as well.  CSW confirmed with pt that she had both of her vaccines at the hospital. Pt explained that she was thinking about going home at discharge however know that she may need some rehab.    CSW started pt authorization through [place] for a start date of 06/16/2021. Pt may need update PT note uploaded due to the last one being 72 hours. [place] Reference number [id number].  Expected Discharge Plan: [place] Barriers to Discharge: No Barriers Identified  Expected Discharge Plan and Services Expected Discharge Plan: [place] In-house Referral: Clinical Social Work Discharge Planning Services: CM Consult Post Acute Care Choice: [place] arrangements for the past 2 months: Single Family Home Expected Discharge Date: 06/05/21                 DME Agency: NA                   Social Determinants of Health ([place]) Interventions    Readmission Risk Interventions Readmission Risk Prevention Plan 06/02/2021 09/29/2020  Transportation Screening Complete Complete  PCP or Specialist Appt within 5-7 Days - Complete  Home Care Screening - Complete  Medication Review (RN CM) - Complete  Medication Review Press photographer) Complete -  PCP or Specialist appointment within 3-5 days of discharge Complete -  [place] or Home Care Consult Complete -  SW Recovery Care/Counseling Consult Complete -  Palliative Care Screening Complete -  [place]  Complete -  Some recent data might be hidden

## 2021-06-15 NOTE — Progress Notes (Signed)
HD#19 Subjective:  Overnight Events: No acute overnight events reported.   Interim History: Ms Placida Cambre reports feeling well this morning. She denies any acute complaints at this time and is looking forward to discharge to facility in the morning.   Objective:  Vital signs in last 24 hours: Vitals:   06/14/21 1314 06/14/21 1900 06/14/21 2043 06/15/21 0357  BP: 117/65 (!) 90/55 (!) 97/57 110/65  Pulse: 63 65 68 (!) 56  Resp: 17 16 (!) 23 16  Temp: 98.2 F (36.8 C)  97.9 F (36.6 C) 97.9 F (36.6 C)  TempSrc: Oral  Oral Oral  SpO2: 94% 97% 96% 100%  Weight:    46.1 kg  Height:       Supplemental O2: Room Air SpO2: 100 % O2 Flow Rate (L/min): 4 L/min   Physical Exam:  Physical Exam  Constitutional: chronically ill appearing frail elderly female, no acute distress  HENT: Normocephalic and atraumatic, EOMI, conjunctiva normal, moist mucous membranes Cardiovascular: Normal rate, regular rhythm, S1 and S2 present, no murmurs, rubs, gallops.  Distal pulses intact Respiratory: No respiratory distress, no accessory muscle use. Lungs are clear to auscultation bilaterally. GI: Nondistended, soft, nontender to palpation, active bowel sounds Musculoskeletal: Normal bulk and tone.  No peripheral edema noted. Bilateral legs wrapped  Neurological: Is alert and oriented x4, no apparent focal deficits noted. Skin: Warm and dry.  Psychiatric: Normal mood and affect. Behavior is normal. Judgment and thought content normal.   Filed Weights   06/13/21 0500 06/14/21 0616 06/15/21 0357  Weight: 43.8 kg 44 kg 46.1 kg     Intake/Output Summary (Last 24 hours) at 06/15/2021 0650 Last data filed at 06/15/2021 0200 Gross per 24 hour  Intake 480 ml  Output 1350 ml  Net -870 ml   Net IO Since Admission: -8,930.97 mL [06/15/21 0650]  Pertinent Labs: CBC Latest Ref Rng & Units 06/13/2021 06/01/2021 05/31/2021  WBC 4.0 - 10.5 K/uL 6.1 7.7 8.0  Hemoglobin 12.0 - 15.0 g/dL 8.7(L) 9.1(L)  9.1(L)  Hematocrit 36.0 - 46.0 % 27.6(L) 28.0(L) 28.3(L)  Platelets 150 - 400 K/uL 533(H) 426(H) 454(H)    CMP Latest Ref Rng & Units 06/13/2021 06/12/2021 06/11/2021  Glucose 70 - 99 mg/dL 103(H) 97 92  BUN 8 - 23 mg/dL _0 Creatinine 0.44 - 1.00 mg/dL 0.73 0.64 0.57  Sodium 135 - 145 mmol/L 128(L) 129(L) 129(L)  Potassium 3.5 - 5.1 mmol/L 4.4 4.5 4.4  Chloride 98 - 111 mmol/L 94(L) 95(L) 96(L)  CO2 22 - 32 mmol/L _1 Calcium 8.9 - 10.3 mg/dL 8.8(L) 8.5(L) 8.4(L)  Total Protein 6.5 - 8.1 g/dL - - -  Total Bilirubin 0.3 - 1.2 mg/dL - - -  Alkaline Phos 38 - 126 U/L - - -  AST 15 - 41 U/L - - -  ALT 0 - 44 U/L - - -    Imaging: No results found.  Assessment/Plan:   Principal Problem:   Aspiration pneumonia ([place]) Active Problems:   Venous insufficiency of both lower extremities   Atrial fibrillation with RVR (HCC)   Protein-calorie malnutrition, severe   CAP (community acquired pneumonia)   Pressure injury of skin   Patient Summary: Kristen [name] is a 75 y.o. with a pertinent PMH of COPD on 3L at baseline, who presented with worsening dyspnea and chest tightness admitted for atrial fibrillation with RVR in setting of aspiration pneumonia for which she completed course of antibiotics and is currently rate  controlled. Hospital course complicated by incidental COVID infection. Patient is stable for discharge to SNF following quarantine period.    COVID 19 infection, incidental Patient tested positive on 7/14. Patient is afebrile and is saturating well on baseline home oxygen of 3L [place]. Patient is stable for discharge to SNF following quarantine period. Expected discharge 7/25 to [place].  - Continue supportive care with oxygen supplementation   Chronic hyponatremia, stable  Na has been stable around 127-129 with salt tablet administration. Work up consistent with low solute state. She is at baseline at this time. - Intermittent BMP monitoring - Continue salt  tablets   Atrial fibrillation CHADS2VASc 3. Currently in NSR with amiodarone.  - Continue amiodarone 245m daily - Continue Xarelto 123mdaily   Bilateral lower extremity venous stasis ulcers 2/2 chronic venous insufficiency Continue daily dressing changes.   Diet: Normal IVF: None,None VTE: NOAC Code: DNR PT/OT recs: SNF for Subacute PT, none.  Prior to Admission Living Arrangement: Home Anticipated Discharge Location: SNF Barriers to Discharge: Medically stable for discharge; SNF placement  Dispo: Anticipated discharge to Skilled nursing facility in 1 days pending SNF Placement.   SaHarvie HeckMD Internal Medicine Resident PGY-3 Pager# [phone] contact the on call pager after 5 pm and on weekends at [phone]

## 2021-06-16 DIAGNOSIS — J690 Pneumonitis due to inhalation of food and vomit: Secondary | ICD-10-CM | POA: Diagnosis not present

## 2021-06-16 DIAGNOSIS — I4891 Unspecified atrial fibrillation: Secondary | ICD-10-CM | POA: Diagnosis not present

## 2021-06-16 DIAGNOSIS — I959 Hypotension, unspecified: Secondary | ICD-10-CM

## 2021-06-16 DIAGNOSIS — J449 Chronic obstructive pulmonary disease, unspecified: Secondary | ICD-10-CM | POA: Diagnosis not present

## 2021-06-16 DIAGNOSIS — I480 Paroxysmal atrial fibrillation: Secondary | ICD-10-CM

## 2021-06-16 NOTE — Progress Notes (Signed)
HD#20 Subjective:  No acute events overnight .   Patient seen and evaluated at bedside. She says she is doing well. No complaints at this time.  Objective:  Vital signs in last 24 hours: Vitals:   06/15/21 1349 06/15/21 2000 06/16/21 0418 06/16/21 0732  BP: 109/65 102/60 107/61   Pulse: 68 (!) 55 (!) 58   Resp: _0 Temp: 98 F (36.7 C) 97.9 F (36.6 C) 97.7 F (36.5 C) (!) 97.4 F (36.3 C)  TempSrc: Oral Oral Oral Oral  SpO2: 99% 100% 100%   Weight:   44.3 kg   Height:       Supplemental O2: Nasal Cannula SpO2: 100 % O2 Flow Rate (L/min): 3 L/min   Physical Exam:  Constitutional: tremulous and cachectic- appearing elderly woman sitting up in bed.  Cardiovascular: Normal sinus rhythm, regular rate Pulmonary/Chest: normal work of breathing on 3L [place] MSK: poor bulk and tone Neurological: alert & answering questions appropriately   Filed Weights   06/14/21 0616 06/15/21 0357 06/16/21 0418  Weight: 44 kg 46.1 kg 44.3 kg     Intake/Output Summary (Last 24 hours) at 06/16/2021 1703 Last data filed at 06/16/2021 0421 Gross per 24 hour  Intake 720 ml  Output 1900 ml  Net -1180 ml     Net IO Since Admission: -10,420.97 mL [06/16/21 1703]  Pertinent Labs: CBC Latest Ref Rng & Units 06/13/2021 06/01/2021 05/31/2021  WBC 4.0 - 10.5 K/uL 6.1 7.7 8.0  Hemoglobin 12.0 - 15.0 g/dL 8.7(L) 9.1(L) 9.1(L)  Hematocrit 36.0 - 46.0 % 27.6(L) 28.0(L) 28.3(L)  Platelets 150 - 400 K/uL 533(H) 426(H) 454(H)    CMP Latest Ref Rng & Units 06/13/2021 06/12/2021 06/11/2021  Glucose 70 - 99 mg/dL 103(H) 97 92  BUN 8 - 23 mg/dL _1 Creatinine 0.44 - 1.00 mg/dL 0.73 0.64 0.57  Sodium 135 - 145 mmol/L 128(L) 129(L) 129(L)  Potassium 3.5 - 5.1 mmol/L 4.4 4.5 4.4  Chloride 98 - 111 mmol/L 94(L) 95(L) 96(L)  CO2 22 - 32 mmol/L _2 Calcium 8.9 - 10.3 mg/dL 8.8(L) 8.5(L) 8.4(L)  Total Protein 6.5 - 8.1 g/dL - - -  Total Bilirubin 0.3 - 1.2 mg/dL - - -  Alkaline Phos 38 -  126 U/L - - -  AST 15 - 41 U/L - - -  ALT 0 - 44 U/L - - -    Imaging: No results found.  Assessment/Plan:   Principal Problem:   Aspiration pneumonia ([place]) Active Problems:   Venous insufficiency of both lower extremities   Atrial fibrillation with RVR (HCC)   Protein-calorie malnutrition, severe   CAP (community acquired pneumonia)   Pressure injury of skin   Patient Summary: Kristen [name] is a 75 y.o. with a pertinent PMH of PNA and COPD on home O2, who presented with worsening shortness of breath and chest tightness and admitted for afib with RVR in HR 150s. She has completed a course of antibiotics for aspiration pneumonia and was stable for discharge to SNF, however incidentally tested positive for COVID on anticipated day of discharge.  COVID-19 Infection Day of positive test: 7/14, barrier to SNF placement. Pulmonary status unchanged from baseline, saturating appropriately on home oxygen requirement 3L [place]. Continues to remain afebrile. Paxlovid contraindicated due to patient being treated with amiodarone for a-fib. Alternate anti-virals and Ab therapy not clinically indicated at this time as per pharmacy. Given her chronic COPD, and recent recovery from aspiration  PNA, she is at higher risk for COVID-related complications.  - Routine Vitals and  baseline supplemental oxygen 3L [place]; up titrate as needed  Hypomagnesemia 1.8, repleted - monitor   Chronic Hyponatremia (Stable) Na 128 stable with salt tablets. Baseline 128-130. No changes in mental status. Hyponatremia studies done in 02/2021 pointed towards low solute state as likely etiology, however most recent exacerbation likely secondary to hypovolemia given low blood pressures recently.  - fluid resuscitate as needed - morning BMP - Free water restriction liberalized to 1800 - Morning CMP - IV Zofran for Nausea - meal encouragement - continue salt tablets   Aspiration Pneumonia (Resolved): Continues to saturate well  on baseline 3L [place]. No changes in management. - Aspiration precautions   Atrial Fibrillation with RVR (Resolved) Remains in NSR. Cardiology has signed off. Will continue telemetry monitoring given current COVID 19 infection. - IV Zofran - Oral Amiodarone 200 mg daily - Continue Xarelto 15 mg daily              COPD Exacerbation (Resolved) Patient's respiratory status continues to remain stable on home oxygen 3L [place]. Will continue maintenance regimen.  - Continue home COPD medications (Yupelri, Brovana, Pulmicort) - Continue Duonebs q4 hours PRN - Incentive spirometry/flutter valve  Transient Hypotension (resolved) Pressures down to sbp of 80s and maps of 50s-60s. Symptomatic with dizziness and nausea. EKG unremarkable. Improving with fluids. Likely due to combinatorial effects of hyponatremia of 122, fluid restriction, and amiodarone use in the setting of cachexia. Will monitor hemodynamic stability and fluid resuscitate as needed. - fluid resuscitate as needed - routine vitals  Sepsis/septic shock (resolved) - patient was admitted for aspiration pneumonia. Sepsis was ruled in based on findings of hypotension, tachycardia, leukocytosis, and elevated lactic acid in the setting of aspiration pneumonia.  This has now resolved.   Bilateral lower extremity venous stasis ulcers 2/2 Chronic venous insufficiency Continue daily dressing changes.  Best Practice: Diet: Regular diet IVF: none VTE: Rivaroxaban (XARELTO) tablet 15 mg  Code: DNR PT/OT: Home health PT Family Contact: Notified DISPO: Medically stable for discharge. SNF placement  Delene Ruffini, PGY1 Pager: [phone] Please contact the on call pager after 5 pm and on weekends at [phone].

## 2021-06-16 NOTE — TOC Progression Note (Addendum)
Transition of Care [facility]) - Progression Note    Patient Details  Name: Kristen [name] MRN: [id number] Date of Birth: 12/15/1945  Transition of Care [facility]) CM/SW [place], [place] Phone Number: 06/16/2021, 9:45 AM  Clinical Narrative:     Update- CSW spoke with patients insurance. They are requesting additional clinicals for review for insurance authorization determination. CSW requested to MD for current PT eval for patients insurance. CSW awaiting PT evaluation to send additional clinical to patients insurance for review.   CSW spoke with patient regarding dc plan. Patient is still in agreement to short term rehab at [place] when medically ready. CSW followed up on patients insurance authorization. Currently insurance authorization is pending. CSW called Kristen [name] with [employer] to give update on status of insurance. CSW left voicemail and awaiting callback. CSW will continue to follow and assist with dc planning needs.  Expected Discharge Plan: [place] Barriers to Discharge: No Barriers Identified  Expected Discharge Plan and Services Expected Discharge Plan: [place] In-house Referral: Clinical Social Work Discharge Planning Services: CM Consult Post Acute Care Choice: [place] arrangements for the past 2 months: Single Family Home Expected Discharge Date: 06/05/21                 DME Agency: NA                   Social Determinants of Health ([place]) Interventions    Readmission Risk Interventions Readmission Risk Prevention Plan 06/02/2021 09/29/2020  Transportation Screening Complete Complete  PCP or Specialist Appt within 5-7 Days - Complete  Home Care Screening - Complete  Medication Review (RN CM) - Complete  Medication Review Press photographer) Complete -  PCP or Specialist appointment within 3-5 days of discharge Complete -  [place] or Home Care Consult Complete -  SW Recovery Care/Counseling  Consult Complete -  Palliative Care Screening Complete -  [place] Complete -  Some recent data might be hidden

## 2021-06-17 ENCOUNTER — Ambulatory Visit: Payer: Medicare Other | Admitting: Cardiology

## 2021-06-17 DIAGNOSIS — I4891 Unspecified atrial fibrillation: Secondary | ICD-10-CM | POA: Diagnosis not present

## 2021-06-17 DIAGNOSIS — J690 Pneumonitis due to inhalation of food and vomit: Secondary | ICD-10-CM | POA: Diagnosis not present

## 2021-06-17 DIAGNOSIS — I959 Hypotension, unspecified: Secondary | ICD-10-CM | POA: Diagnosis not present

## 2021-06-17 DIAGNOSIS — J449 Chronic obstructive pulmonary disease, unspecified: Secondary | ICD-10-CM | POA: Diagnosis not present

## 2021-06-17 MED ORDER — SODIUM CHLORIDE 1 G PO TABS: 1.0000 g | ORAL_TABLET | Freq: Two times a day (BID) | ORAL | 0 refills | Status: AC

## 2021-06-17 MED ORDER — SENNOSIDES-DOCUSATE SODIUM 8.6-50 MG PO TABS: 2.0000 | ORAL_TABLET | Freq: Every day | ORAL | Status: DC

## 2021-06-17 MED ORDER — LACTATED RINGERS IV BOLUS
500.0000 mL | Freq: Once | INTRAVENOUS | Status: AC
  Administered 2021-06-17: 500 mL via INTRAVENOUS

## 2021-06-17 MED ORDER — SENNOSIDES-DOCUSATE SODIUM 8.6-50 MG PO TABS: 2.0000 | ORAL_TABLET | Freq: Every day | ORAL | 0 refills | Status: AC

## 2021-06-17 MED ORDER — LACTATED RINGERS IV SOLN: INTRAVENOUS | Status: AC

## 2021-06-17 NOTE — Progress Notes (Incomplete)
Cardiology Office Note:    Date:  06/17/2021   ID:  Kristen [name], DOB 1946-05-31, MRN [id number]  PCP:  Kristen Dame, MD   [facility] Providers Cardiologist:  Kristen Bergeron, MD { Click to update primary MD,subspecialty MD or APP then REFRESH:1}    Referring MD: Kristen Dame, MD    History of Present Illness:    Kristen [name] is a 75 y.o. female with a hx of paroxysmal atrial fibrillation diagnosed in January 2022 on Xarelto, tobacco abuse, COPD on home O2, bilateral ankle wound, chronic venous stasis dermatitis, and hyponatremia who presents to clinic for follow-up.  Patient was initially seen in [facility] hospital when she was admitted from 05/27/21 to 06/05/21 with aspiration PNA, COPD, and Afib with RVR. During admission, DCCV attempted in the ER x3 without success. Was placed on amiodarone gtt as did not tolerate metop due to hypotension. Ultimately, she converted to NSR and was placed on amiodarone load and continued on her home xarelto for CHADs-vasc 4. She now presents to clinic for follow-up.  Today,  She denies any chest pain, shortness of breath, palpitations, or exertional symptoms. No headaches, lightheadedness, or syncope to report. Also has no lower extremity edema, orthopnea or PND.   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     Allergies:   Gabapentin   Social History   Socioeconomic History   Marital status: Divorced    Spouse name: Not on file   Number of children: Not on file    Years of education: Not on file   Highest education level: Not on file  Occupational History    Comment: RETIRED  Tobacco Use   Smoking status: Former    Packs/day: 0.25    Types: Cigarettes    Quit date: 10/16/2019    Years since quitting: 1.6   Smokeless tobacco: Never   Tobacco comments:    2-3 per day   Vaping Use   Vaping Use: Never used  Substance and Sexual Activity   Alcohol use: Yes    Comment: occasionally   Drug use: Never   Sexual activity: Not on file  Other Topics Concern   Not on file  Social History Narrative   Not on file   Social Determinants of Health   Financial Resource Strain: Not on file  Food Insecurity: Not on file  Transportation Needs: Not on file  Physical Activity: Not on file  Stress: Not on file  Social Connections: Not on file     Family History: The patient's family history includes Diabetes in her father and mother; Diabetes Mellitus II in her mother; Diverticulitis in her sister.  ROS:   Review of Systems  Constitutional:  Negative for chills and weight loss.  HENT:  Negative for ear discharge and tinnitus.   Eyes:  Negative for discharge and redness.  Respiratory:  Negative for hemoptysis and wheezing.   Cardiovascular:  Negative for chest pain, palpitations, orthopnea, claudication, leg swelling and PND.  Gastrointestinal:  Negative for diarrhea and heartburn.  Genitourinary:  Negative for frequency and urgency.  Musculoskeletal:  Negative for myalgias and neck pain.  Neurological:  Negative for tremors and loss of consciousness.  Endo/Heme/Allergies:  Negative for environmental allergies.  Psychiatric/Behavioral:  Negative for hallucinations and memory loss.     EKGs/Labs/Other Studies Reviewed:    The following studies were reviewed today:  Echo 10/23/2020 1. Left ventricular ejection fraction, by estimation, is 55 to 60%. The  left ventricle has normal function. The left ventricle has no regional  wall motion  abnormalities. Left ventricular diastolic parameters are  consistent with Grade I diastolic  dysfunction (impaired relaxation). There is the interventricular septum is  flattened in diastole ('D' shaped left ventricle), consistent with right  ventricular volume overload.   2. Right ventricular systolic function is low normal. The right  ventricular size is normal.   3. The mitral valve is grossly normal. No evidence of mitral valve  regurgitation.   4. The aortic valve is tricuspid. There is mild calcification of the  aortic valve. There is mild thickening of the aortic valve. Aortic valve  regurgitation is not visualized.   5. The inferior vena cava is normal in size with greater than 50%  respiratory variability, suggesting right atrial pressure of 3 mmHg.      EKG:   06/17/2021: ***  Recent Labs: 05/27/2021: B Natriuretic Peptide 526.6 05/30/2021: ALT 9 06/03/2021: TSH 5.906 06/13/2021: BUN 10; Creatinine, Ser 0.73; Hemoglobin 8.7; Platelets 533; Potassium 4.4; Sodium 128 06/14/2021: Magnesium 1.8  Recent Lipid Panel No results found for: CHOL, TRIG, HDL, CHOLHDL, VLDL, LDLCALC, LDLDIRECT   Risk Assessment/Calculations:   {Does this patient have ATRIAL FIBRILLATION?:[phone]}       Physical Exam:    VS:  There were no vitals taken for this visit.    Wt Readings from Last 3 Encounters:  06/17/21 102 lb 4.7 oz (46.4 kg)  05/05/21 90 lb 4.8 oz (41 kg)  03/07/21 95 lb 14.4 oz (43.5 kg)     GEN: Well nourished, well developed in no acute distress HEENT: Normal NECK: No JVD; No carotid bruits LYMPHATICS: No lymphadenopathy CARDIAC: RRR, no murmurs, rubs, gallops RESPIRATORY:  Clear to auscultation without rales, wheezing or rhonchi  ABDOMEN: Soft, non-tender, non-distended MUSCULOSKELETAL:  No edema; No deformity  SKIN: Warm and dry NEUROLOGIC:  Alert and oriented x 3 PSYCHIATRIC:  Normal affect   ASSESSMENT:    No diagnosis found. PLAN:    In order of problems  listed above:  #Atrial fibrillation with RVR: CHADs-vasc 4. Has known diagnosis of Afib that was initially diagnosed during a hospitalization in 11/2020 at which point she was started on metop and xarelto. During admission from 05/27/21-06/05/21, patient presented with Afib with RVR in the setting of septic shock. Attempted DCCV x3 in the ER without success and therefore placed on amiodarone gtt. She eventually was transitioned to PO amiodarone and resumed on home xarelto. -Continue amiodarone 27m daily -Continue xarelto 153mdaily -Continue metop 2559mL daily   #COPD #Chronic Tobacco Use: -Management per pulmonary   {Are you ordering a CV Procedure (e.g. stress test, cath, DCCV, TEE, etc)?   Press F2        :[id number]}ollow-up in ***  Medication Adjustments/Labs and Tests Ordered: Current medicines are reviewed at length with the patient today.  Concerns regarding medicines are outlined above.  No orders of the defined types were placed in this encounter.  No orders of the defined types were  placed in this encounter.   There are no Patient Instructions on file for this visit.   I,Mathew Stumpf,acting as a Education administrator for Kristen Bergeron, MD.,have documented all relevant documentation on the behalf of Kristen Bergeron, MD,as directed by  Kristen Bergeron, MD while in the presence of Kristen Bergeron, MD.  ***  Signed, Madelin Rear  06/17/2021 9:08 AM    [place]

## 2021-06-17 NOTE — TOC Transition Note (Signed)
Transition of Care [facility]) - CM/SW Discharge Note   Patient Details  Name: Kristen [name] MRN: [id number] Date of Birth: 1946/09/02  Transition of Care [facility]) CM/SW Contact:  Trula Ore, [place] Phone Number: 06/17/2021, 12:13 PM   Clinical Narrative:     Patient will DC to: [facility]  Anticipated DC date: 06/17/2021  Family notified: [name]   Transport by: Corey Harold  ?  Per MD patient ready for DC to [facility] with palliative services to follow . RN, patient, patient's family, Anderson Malta with [facility], and facility notified of DC. Discharge Summary sent to facility. RN given number for report tele# [phone] ask for patients nurse. DC packet on chart. DNR signed by MD attached to patients DC packet.Ambulance transport requested for patient.  CSW signing off.   Final next level of care: Skilled Nursing Facility Barriers to Discharge: No Barriers Identified   Patient Goals and CMS Choice Patient states their goals for this hospitalization and ongoing recovery are:: to go to SNF CMS Medicare.gov Compare Post Acute Care list provided to:: Patient Choice offered to / list presented to : Patient  Discharge Placement              Patient chooses bed at: [facility] Patient to be transferred to facility by: [facility] Name of family member notified: [name] Patient and family notified of of transfer: 06/17/21  Discharge Plan and Services In-house Referral: Clinical Social Work Discharge Planning Services: CM Consult Post Acute Care Choice: [place]            DME Agency: NA                  Social Determinants of Health ([place]) Interventions     Readmission Risk Interventions Readmission Risk Prevention Plan 06/02/2021 09/29/2020  Transportation Screening Complete Complete  PCP or Specialist Appt within 5-7 Days - Complete  Home Care Screening - Complete  Medication Review (RN CM) - Complete  Medication Review Press photographer) Complete -  PCP or  Specialist appointment within 3-5 days of discharge Complete -  [place] or Home Care Consult Complete -  SW Recovery Care/Counseling Consult Complete -  Palliative Care Screening Complete -  Skilled Nursing Facility Complete -  Some recent data might be hidden

## 2021-06-17 NOTE — Progress Notes (Signed)
[facility])  Referral received for outpatient palliative care services at [place].  Thank you, Venia Carbon RN, BSN, [facility] Liaison

## 2021-06-17 NOTE — Progress Notes (Signed)
Physical Therapy Treatment Patient Details Name: Kristen [name] MRN: [id number] DOB: 1946/09/13 Today's Date: 06/17/2021    History of Present Illness Pt is a 75 y.o. female admitted 05/27/21 with SOB, afib with RVR, COPD exacerbation. Attempted 3x cardioversion, unsuccessful. Completed course of antibiotics for aspiration PNA. Incidental (+) COVID-19 on 7/14. PMH includes COPD (3L O2 baseline), afib, cellulitis, chronic venous stasis dermatitis.   PT Comments    Pt with increased nausea and fatigue this session, as well as pre-syncopal episodes while using BSC; return to supine BP 88/59 (RN/MD present at end of session and aware). Pt requiring up to [facility] for mobility with BSC. Pt limited by diffuse weakness, decreased activity tolerance, impaired balance strategies/postural reactions; at high risk for falls. Continue to recommend SNF-level therapies to maximize functional mobility and independence.    Follow Up Recommendations  SNF;Supervision for mobility/OOB     Equipment Recommendations  Wheelchair (measurements PT);Wheelchair cushion (measurements PT);Hospital bed    Recommendations for Other Services       Precautions / Restrictions Precautions Precautions: Fall;Other (comment) Precaution Comments: L foot bleeding; wears 3L O2 baseline Restrictions Weight Bearing Restrictions: No    Mobility  Bed Mobility Overal bed mobility: Needs Assistance Bed Mobility: Supine to Sit     Supine to sit: Min guard          Transfers Overall transfer level: Needs assistance Equipment used: Rolling walker (2 wheeled) Transfers: Sit to/from Stand Sit to Stand: Min assist Stand pivot transfers: Min assist;Mod assist       General transfer comment: Initial minA to stand from EOB; pt with c/o dizzness and fatigue requiring increased assist to stand from [facility], ultimately requiring modA to safely pivot to bed as pt with pre-syncopal symptoms  Ambulation/Gait             General  Gait Details: unable to tolerate this session, limited by fatigue and dizziness   Stairs             Wheelchair Mobility    Modified Rankin (Stroke Patients Only)       Balance Overall balance assessment: Needs assistance Sitting-balance support: No upper extremity supported Sitting balance-Leahy Scale: Fair Sitting balance - Comments: Able to don shoes; maintains forward flexed posture, endorses fear of falling while sitting on BSC     Standing balance-Leahy Scale: Poor Standing balance comment: Reliant on UE support; unable to accept challenge                            Cognition Arousal/Alertness: Awake/alert Behavior During Therapy: Flat affect Overall Cognitive Status: No family/caregiver present to determine baseline cognitive functioning                                 General Comments: Pt focused on nausea and feeling weak, difficult to redirect from this; appears limited by fatigue. When redirected to current task, pt states, "Just give me a minute honey... I just need a couple minutes"      Exercises      General Comments General comments (skin integrity, edema, etc.): Pt endorses limited by nausea and fatigue; pre-syncopal episodes while using BSC (RN/MD present and aware), return to supine BP 88/59, HR 55      Pertinent Vitals/Pain Pain Assessment: Faces Faces Pain Scale: Hurts a little bit Pain Location: Generalized Pain Descriptors / Indicators: Tiring;Discomfort Pain Intervention(s): Monitored during session;Limited  activity within patient's tolerance    Home Living                      Prior Function            PT Goals (current goals can now be found in the care plan section) Acute Rehab PT Goals Patient Stated Goal: "I'm tired of feeling so weak" PT Goal Formulation: With patient Time For Goal Achievement: 07/01/21 Potential to Achieve Goals: Fair Progress towards PT goals: Not progressing toward goals  - comment (increased nausea, fatigue, dizziness this session)    Frequency    Min 2X/week      PT Plan Current plan remains appropriate    Co-evaluation              AM-PAC PT "6 Clicks" Mobility   Outcome Measure  Help needed turning from your back to your side while in a flat bed without using bedrails?: A Little Help needed moving from lying on your back to sitting on the side of a flat bed without using bedrails?: A Little Help needed moving to and from a bed to a chair (including a wheelchair)?: A Little Help needed standing up from a chair using your arms (e.g., wheelchair or bedside chair)?: A Little Help needed to walk in hospital room?: A Lot Help needed climbing 3-5 steps with a railing? : A Lot 6 Click Score: 16    End of Session Equipment Utilized During Treatment: Gait belt;Oxygen Activity Tolerance: Patient limited by fatigue Patient left: in bed;with call bell/phone within reach;with bed alarm set;with nursing/sitter in room;Other (comment) (with MDs present) Nurse Communication: Mobility status PT Visit Diagnosis: Difficulty in walking, not elsewhere classified (R26.2);Other abnormalities of gait and mobility (R26.89);Muscle weakness (generalized) (M62.81)     Time: 6816-6196 PT Time Calculation (min) (ACUTE ONLY): 30 min  Charges:  $Therapeutic Activity: 23-37 mins                     Mabeline Caras, PT, DPT Acute Rehabilitation Services  Pager [phone] Office [phone]  Derry Lory 06/17/2021, 9:27 AM

## 2021-06-17 NOTE — Progress Notes (Signed)
Report called to [facility] LPN at [place], patient aware of the d/c, fluids continue at this time. VS wnl as charted.

## 2021-06-17 NOTE — Progress Notes (Signed)
MD notified of pt's low BPs, reviewed meds, pt refused some meds.MD at bedside has assessed pt, new order obtained for bolus, pt alert and oriented x4.

## 2021-06-17 NOTE — TOC Progression Note (Signed)
Transition of Care [facility]) - Progression Note    Patient Details  Name: Kristen [name] MRN: [id number] Date of Birth: Dec 10, 1945  Transition of Care [facility]) CM/SW [place], [place] Phone Number: 06/17/2021, 11:14 AM  Clinical Narrative:     CSW received insurance authorization approval for patient. Plan Auth ID# [id number]. [place] ID# [id number]. Insurance approval start date is for 7/26. Next review date is 7/28. CSW spoke with Rolla Plate with [facility] who confirmed they can accept patient today if medically ready. CSW informed MD. Per Rolla Plate no covid needed for patient to dc over.  Expected Discharge Plan: [place] Barriers to Discharge: No Barriers Identified  Expected Discharge Plan and Services Expected Discharge Plan: [place] In-house Referral: Clinical Social Work Discharge Planning Services: CM Consult Post Acute Care Choice: [place] arrangements for the past 2 months: Single Family Home Expected Discharge Date: 06/05/21                 DME Agency: NA                   Social Determinants of Health ([place]) Interventions    Readmission Risk Interventions Readmission Risk Prevention Plan 06/02/2021 09/29/2020  Transportation Screening Complete Complete  PCP or Specialist Appt within 5-7 Days - Complete  Home Care Screening - Complete  Medication Review (RN CM) - Complete  Medication Review Press photographer) Complete -  PCP or Specialist appointment within 3-5 days of discharge Complete -  [place] or Home Care Consult Complete -  SW Recovery Care/Counseling Consult Complete -  Palliative Care Screening Complete -  [place] Complete -  Some recent data might be hidden

## 2021-06-17 NOTE — Progress Notes (Signed)
Occupational Therapy Treatment Patient Details Name: Kristen [name] MRN: [id number] DOB: 27-Aug-1946 Today's Date: 06/17/2021    History of present illness Pt is a 75 y.o. female admitted 05/27/21 with SOB, afib with RVR, COPD exacerbation. Attempted 3x cardioversion, unsuccessful. Completed course of antibiotics for aspiration PNA. Incidental (+) COVID-19 on 7/14. PMH includes COPD (3L O2 baseline), afib, cellulitis, chronic venous stasis dermatitis.   OT comments  Kristen [name] is progressing, however limited by nausea, fatigue and orthostatic BP drop this session. Pt initially declining upon arrival, however agreeable to ADLs in room and EOB. Pt experienced orthostatic drop in BP with dizziness and "feeling weak."  Pt returned supine with min A and session discontinued. RN and MD notified. Pt continues to benefit from acute OT. D/c recommendation remains appropriate.   BPs: Supine:122/61 Sitting EOB: 103/68 Standing: 78/58 Supine: 125/64     Follow Up Recommendations  SNF    Equipment Recommendations  None recommended by OT       Precautions / Restrictions Precautions Precautions: Fall;Other (comment) Precaution Comments: 3L O2 at baseline, orthostatic BP drop Restrictions Weight Bearing Restrictions: No       Mobility Bed Mobility Overal bed mobility: Needs Assistance Bed Mobility: Supine to Sit     Supine to sit: Supervision Sit to supine: Min assist   General bed mobility comments: min A to return to supine    Transfers Overall transfer level: Needs assistance Equipment used: Rolling walker (2 wheeled) Transfers: Sit to/from Stand Sit to Stand: Min guard;From elevated surface Stand pivot transfers: Min assist;Mod assist       General transfer comment: close min guard for sit>stand and steadying in standing. pt with fair static standing balance    Balance Overall balance assessment: Needs assistance Sitting-balance support: No upper extremity supported Sitting  balance-Leahy Scale: Fair Sitting balance - Comments: Able to don shoes; maintains forward flexed posture, endorses fear of falling while sitting on BSC     Standing balance-Leahy Scale: Poor Standing balance comment: Reliant on UE support; unable to accept challenge                           ADL either performed or assessed with clinical judgement   ADL Overall ADL's : Needs assistance/impaired                     Lower Body Dressing: Min guard;Sitting/lateral leans;Sit to/from stand Lower Body Dressing Details (indicate cue type and reason): Pt able to don bilat shoes             Functional mobility during ADLs: Min guard;Rolling walker General ADL Comments: Orthostatic BP limiting ADL session     Vision   Vision Assessment?: No apparent visual deficits    Cognition Arousal/Alertness: Awake/alert Behavior During Therapy: WFL for tasks assessed/performed Overall Cognitive Status: No family/caregiver present to determine baseline cognitive functioning             General Comments: pt anxious for therapy, expressed fatigued and nausea symptoms and stating several times "I just don't feel right"              General Comments pt with symptomatic othrostatic BP this session    Pertinent Vitals/ Pain       Pain Assessment: Faces Faces Pain Scale: Hurts a little bit Pain Location: Generalized Pain Descriptors / Indicators: Tiring;Discomfort Pain Intervention(s): Limited activity within patient's tolerance;Monitored during session   Frequency  Min 2X/week  Progress Toward Goals  OT Goals(current goals can now be found in the care plan section)  Progress towards OT goals: Progressing toward goals  Acute Rehab OT Goals Patient Stated Goal: "I'm tired of feeling so weak" OT Goal Formulation: With patient ADL Goals Pt Will Perform Grooming: with modified independence;standing Pt Will Transfer to Toilet: with modified  independence;ambulating Pt Will Perform Toileting - Clothing Manipulation and hygiene: with modified independence;sit to/from stand Additional ADL Goal #1: Pt will report 3 energy conservation techniques she can use at home  Plan Discharge plan remains appropriate       AM-PAC OT "6 Clicks" Daily Activity     Outcome Measure   Help from another person eating meals?: None Help from another person taking care of personal grooming?: A Little Help from another person toileting, which includes using toliet, bedpan, or urinal?: A Little Help from another person bathing (including washing, rinsing, drying)?: A Little Help from another person to put on and taking off regular upper body clothing?: None Help from another person to put on and taking off regular lower body clothing?: A Little 6 Click Score: 20    End of Session Equipment Utilized During Treatment: Oxygen;Rolling walker  OT Visit Diagnosis: Unsteadiness on feet (R26.81);Other abnormalities of gait and mobility (R26.89);Muscle weakness (generalized) (M62.81)   Activity Tolerance Other (comment);Patient tolerated treatment well (Limited by orthostatic BP drop)   Patient Left in bed;with call bell/phone within reach;with bed alarm set   Nurse Communication Mobility status;Precautions;Weight bearing status (BP and vitals)        Time: 4628-6381 OT Time Calculation (min): 18 min  Charges: OT General Charges $OT Visit: 1 Visit OT Treatments $Therapeutic Activity: 8-22 mins    [name] A Khaleesi Gruel 06/17/2021, 1:18 PM

## 2021-07-03 ENCOUNTER — Ambulatory Visit (INDEPENDENT_AMBULATORY_CARE_PROVIDER_SITE_OTHER): Payer: Medicare Other | Admitting: Student

## 2021-07-03 ENCOUNTER — Other Ambulatory Visit: Payer: Self-pay | Admitting: Student

## 2021-07-03 VITALS — BP 112/75 | HR 83 | Wt 92.4 lb

## 2021-07-03 DIAGNOSIS — E871 Hypo-osmolality and hyponatremia: Secondary | ICD-10-CM | POA: Diagnosis not present

## 2021-07-03 DIAGNOSIS — F17200 Nicotine dependence, unspecified, uncomplicated: Secondary | ICD-10-CM

## 2021-07-03 DIAGNOSIS — J449 Chronic obstructive pulmonary disease, unspecified: Secondary | ICD-10-CM

## 2021-07-03 DIAGNOSIS — E878 Other disorders of electrolyte and fluid balance, not elsewhere classified: Secondary | ICD-10-CM

## 2021-07-03 DIAGNOSIS — L97902 Non-pressure chronic ulcer of unspecified part of unspecified lower leg with fat layer exposed: Secondary | ICD-10-CM

## 2021-07-03 DIAGNOSIS — Z0000 Encounter for general adult medical examination without abnormal findings: Secondary | ICD-10-CM | POA: Diagnosis not present

## 2021-07-03 DIAGNOSIS — I480 Paroxysmal atrial fibrillation: Secondary | ICD-10-CM

## 2021-07-03 DIAGNOSIS — I4891 Unspecified atrial fibrillation: Secondary | ICD-10-CM

## 2021-07-03 MED ORDER — OXYCODONE-ACETAMINOPHEN 10-325 MG PO TABS: 1.0000 | ORAL_TABLET | Freq: Three times a day (TID) | ORAL | 0 refills | Status: DC | PRN

## 2021-07-03 MED ORDER — OXYCODONE-ACETAMINOPHEN 10-325 MG PO TABS: 1.0000 | ORAL_TABLET | Freq: Three times a day (TID) | ORAL | 0 refills | Status: AC | PRN

## 2021-07-03 MED ORDER — ALBUTEROL SULFATE HFA 108 (90 BASE) MCG/ACT IN AERS: INHALATION_SPRAY | RESPIRATORY_TRACT | 1 refills | Status: DC

## 2021-07-03 MED ORDER — SHINGRIX 50 MCG/0.5ML IM SUSR: 0.5000 mL | Freq: Once | INTRAMUSCULAR | 0 refills | Status: AC

## 2021-07-03 MED ORDER — BUSPIRONE HCL 10 MG PO TABS: 10.0000 mg | ORAL_TABLET | Freq: Every day | ORAL | 2 refills | Status: DC

## 2021-07-03 NOTE — Assessment & Plan Note (Addendum)
Assessment: Patient found to be hyponatremic during her hospitalization. She had an initial work up for adrenal insufficiency as she was also found to be hypotensive. Work up was negative and it was thought that her low sodium levels were secondary to her poor PO intake and hypervolemia. This has been an ongoing issue for the patient and has dealt with this for some time. She was discharged home on salt tablets. During the hospitalization she notes she was unable to tolerate these and vomited frequently. Since being discharged she has not tried taking them at home.   Plan: -Continue salt tablets -repeat BMP today, consider medication causes of hyponatremia. Can also consider pharmacy refill for assistance with this.   Addendum Phosphorus 3.6, mag of 1.6, sodium of 128, potassium of 4.2.  Suspect patient will need continued supplementation of her sodium and will add mag supplementation. She will need follow up in 2 weeks to repeat electrolyte levels.   Addendum 2 Electrolytes have stabilized.

## 2021-07-03 NOTE — Patient Instructions (Addendum)
Thank you, Ms.Sherlynn Stalls for allowing [name] to provide your care today. Today we discussed:  Recent Hospitalization I am glad to hear you are doing  better since your recent hospital visit! Please continue to try and take your salt tablets and add salt to your diet.   Atrial Fibrillation - Please continue the medications started in the hospital, amiodarone and continue to take your Xarelto.   COPD -please continue your periods, I have reordered your albuterol.  I have also put in an order for you to have a small oxygen concentrator.  Healthcare maintenance -Hepatitis C Testing -Shingrex Vaccine   I have ordered the following labs for you:   Lab Orders         BMP8+Anion Gap         Magnesium         Phosphorus      Referrals ordered today:   Referral Orders  No referral(s) requested today     I have ordered the following medication/changed the following medications:   Stop the following medications: There are no discontinued medications.   Start the following medications: No orders of the defined types were placed in this encounter.    Follow up: 3-4 months follow up with primary care provider   Should you have any questions or concerns please call the internal medicine clinic at [phone].     Sanjuana Letters, D.O. [facility]

## 2021-07-03 NOTE — Assessment & Plan Note (Signed)
Assessment: During recent hospitalization patient was found to be in atrial fibrillation with RVR.  She was continued on her Xarelto and started on amiodarone.  On today's visit she is in normal sinus rhythm.  She denies any symptoms of dizziness shortness of breath.  She denies feeling as though his heart is racing.  She is a follow-up appointment with cardiology in the next coming weeks.  Metoprolol was stopped secondary to hypotensive episodes.  Plan: -Continue amiodarone 200 mg daily, Xarelto 15 mg tabs daily -Follow-up with cardiology

## 2021-07-03 NOTE — Progress Notes (Signed)
CC: Hospital follow-up-COPD, atrial fibrillation, recent COVID-19 infection, hyponatremia, malnutrition  HPI:  [name] is a 75 y.o. female with a past medical history stated below and presents today for hospital follow-up and management of her chronic medical conditions. Please see problem based assessment and plan for additional details.  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 30 tablet 0   busPIRone (BUSPAR) 10 MG tablet Take 10 mg by mouth daily.     docusate sodium (COLACE) 100 MG capsule Take 1 capsule (100 mg total) by mouth 2 (two) times daily as needed for mild constipation. 10 capsule 0   fluticasone (FLONASE) 50 MCG/ACT nasal spray Place 1 spray into both nostrils daily as needed for allergies or rhinitis. 48 mL 1   INCRUSE ELLIPTA 62.5 MCG/INH AEPB INHALE 1 PUFF BY MOUTH EVERY DAY 30 each 4   ipratropium-albuterol (DUONEB) 0.5-2.5 (3) MG/3ML SOLN TAKE 3 MLS BY NEBULIZATION EVERY 6 (SIX) HOURS AS NEEDED (SHORTNESS OF BREATH/WHEEZING.). 360 mL 1   mirtazapine (REMERON) 15 MG tablet Take 1 tablet (15 mg total) by mouth at bedtime as needed (sleep). 90 tablet 2   mometasone-formoterol (DULERA) 200-5 MCG/ACT AERO Inhale 1 puff into the lungs in the morning and at bedtime. 13 each 2   Multiple Vitamin (MULTIVITAMIN WITH MINERALS) TABS tablet Take 1 tablet by mouth daily.     ondansetron (ZOFRAN) 4 MG tablet Take 1  tablet (4 mg total) by mouth every 6 (six) hours as needed for nausea or vomiting. 20 tablet 0   oxyCODONE-acetaminophen (PERCOCET) 10-325 MG tablet Take 1 tablet by mouth every 8 (eight) hours as needed for pain. 90 tablet 0   OXYGEN Inhale 3 L/min into the lungs.     polyethylene glycol (MIRALAX / GLYCOLAX) 17 g packet Take 17 g by mouth daily as needed for moderate constipation. 14 each 0   sodium chloride 1 g tablet Take 1 tablet (1 g total) by mouth 2 (two) times daily with a meal. 60 tablet 0   XARELTO 15 MG TABS tablet TAKE 1 TABLET (15 MG TOTAL) BY MOUTH DAILY. 30 tablet 0   No current facility-administered medications on file prior to visit.    Family History  Problem Relation Age of Onset   Diabetes Mother    Diabetes Mellitus II Mother    Diabetes Father    Diverticulitis Sister     Social History   Socioeconomic History   Marital status: Divorced    Spouse name: Not on file   Number of children: Not on file   Years of education: Not on file   Highest education level: Not on file  Occupational History    Comment: RETIRED  Tobacco Use   Smoking status: Former    Packs/day: 0.25    Types: Cigarettes    Quit date: 10/16/2019    Years  since quitting: 1.7   Smokeless tobacco: Never   Tobacco comments:    2-3 per day   Vaping Use   Vaping Use: Never used  Substance and Sexual Activity   Alcohol use: Yes    Comment: occasionally   Drug use: Never   Sexual activity: Not on file  Other Topics Concern   Not on file  Social History Narrative   Not on file   Social Determinants of Health   Financial Resource Strain: Not on file  Food Insecurity: Not on file  Transportation Needs: Not on file  Physical Activity: Not on file  Stress: Not on file  Social Connections: Not on file  Intimate Partner Violence: Not on file   Review of Systems: ROS negative except for what is noted on the assessment and plan.  There were no vitals filed for this visit.  Physical  Exam: Constitutional: Thin, frail, cachectic HENT: normocephalic atraumatic, nasal cannula in place Eyes: conjunctiva non-erythematous Neck: supple Cardiovascular: regular rate and rhythm, no m/r/g Pulmonary/Chest: normal work of breathing on 3 L nasal cannula lungs clear to auscultation bilaterally MSK: normal bulk and tone Neurological: alert & oriented x 3 Skin: warm and dry Psych: Normal mood and thought process  Assessment & Plan:   See Encounters Tab for problem based charting.  Patient discussed with Dr. Fanny Bien, D.O. [facility], PGY-2 Pager: [phone], Phone: [phone] Date 07/03/2021 Time 1:09 PM

## 2021-07-03 NOTE — Assessment & Plan Note (Signed)
Assessment: History of COPD, current regimen of Incruse Ellipta, Dulera, and albuterol as needed.  She does have O2 saturation needs, doing well on 3 L nasal cannula.  Patient does have some frustration with not having concentrator.  Will place DME order for O2 concentrator.  Plan: -Refilled albuterol inhaler, continue Incruse and Dulera -DME order placed for O2 concentrator

## 2021-07-03 NOTE — Assessment & Plan Note (Deleted)
Assessment: During recent hospitalization patient was found to be in atrial fibrillation with RVR.  She was continued on her Xarelto and started on amiodarone.  On today's visit she is in normal sinus rhythm.  She denies any symptoms of dizziness shortness of breath.  She denies feeling as though his heart is racing.  She is a follow-up appointment with cardiology in the next coming weeks.  Plan: -Continue amiodarone 200 mg daily, Xarelto 15 mg tabs daily -Follow-up with cardiology

## 2021-07-03 NOTE — Assessment & Plan Note (Signed)
Assessment: Patient continues to smoke intermittently.  She states nicotine patches and gum are too expensive for her to be able to afford to she has called 2100 stop smoking phone number that has sent her for a nicotine supplements.  She states she will call them again as her goal is to quit.  She knows not to smoke around her oxygen tanks.  Plan: -Continue smoking cessation at subsequent visits

## 2021-07-03 NOTE — Assessment & Plan Note (Addendum)
  Hepatitis C antibody testing done today.  Also placed order for Shingrix and handed patient prescription for first shot.  Addendum:  Hep c negative

## 2021-07-04 ENCOUNTER — Other Ambulatory Visit: Payer: Self-pay | Admitting: Student

## 2021-07-04 LAB — BMP8+ANION GAP
Anion Gap: 14 mmol/L (ref 10.0–18.0)
BUN/Creatinine Ratio: 29 — ABNORMAL HIGH (ref 12–28)
BUN: 15 mg/dL (ref 8–27)
CO2: 23 mmol/L (ref 20–29)
Calcium: 8.7 mg/dL (ref 8.7–10.3)
Chloride: 91 mmol/L — ABNORMAL LOW (ref 96–106)
Creatinine, Ser: 0.52 mg/dL — ABNORMAL LOW (ref 0.57–1.00)
Glucose: 89 mg/dL (ref 65–99)
Potassium: 4.2 mmol/L (ref 3.5–5.2)
Sodium: 128 mmol/L — ABNORMAL LOW (ref 134–144)
eGFR: 96 mL/min/{1.73_m2} (ref >59)

## 2021-07-04 LAB — MAGNESIUM: Magnesium: 1.6 mg/dL (ref 1.6–2.3)

## 2021-07-04 LAB — PHOSPHORUS: Phosphorus: 3.6 mg/dL (ref 3.0–4.3)

## 2021-07-04 LAB — HEPATITIS C ANTIBODY: Hep C Virus Ab: <0.1 s/co ratio (ref 0.0–0.9)

## 2021-07-07 ENCOUNTER — Telehealth: Payer: Self-pay | Admitting: Student

## 2021-07-07 NOTE — Telephone Encounter (Signed)
Waynette Buttery, Rn calling from [facility] requesting VO. Nursing twice a week for 1 one week, and Once a week for 8 weeks.  Pt is refusing [place] and she is refusing Physical therapy.  The patient has agreed for 1 time Physical therapy assessment.  The patient also refused to have her legs unwrapped to be assessed today as well the nurse.Marland Kitchen  Please call back.

## 2021-07-07 NOTE — Telephone Encounter (Signed)
Verbal auth left on Kristen [name]'s confidential VM for [facility] RN 2 week 1, and 1 week 8. Will route to Red Team for agreement/denial.  Please note that patient refused [place] [facility] and PT, other than assessment.  Also, refused to have legs unwrapped.

## 2021-07-08 ENCOUNTER — Other Ambulatory Visit: Payer: Self-pay | Admitting: Student

## 2021-07-08 ENCOUNTER — Telehealth: Payer: Self-pay | Admitting: *Deleted

## 2021-07-08 DIAGNOSIS — J449 Chronic obstructive pulmonary disease, unspecified: Secondary | ICD-10-CM

## 2021-07-08 DIAGNOSIS — E871 Hypo-osmolality and hyponatremia: Secondary | ICD-10-CM

## 2021-07-08 MED ORDER — MAGNESIUM OXIDE -MG SUPPLEMENT 250 MG PO TABS: 1.0000 | ORAL_TABLET | Freq: Every day | ORAL | 0 refills | Status: AC

## 2021-07-08 NOTE — Addendum Note (Signed)
Addended by: Riesa Pope on: 07/08/2021 02:43 PM   Modules accepted: Orders

## 2021-07-08 NOTE — Telephone Encounter (Signed)
Dr. Johnney Ou, please change the order for oxygen concentrator to DME home use only oxygen. Complete all the questions and write Portable oxygen concentrator in Comments. Thank you.

## 2021-07-08 NOTE — Telephone Encounter (Signed)
Correct order placed, thanks!

## 2021-07-10 ENCOUNTER — Ambulatory Visit (INDEPENDENT_AMBULATORY_CARE_PROVIDER_SITE_OTHER): Payer: Medicare Other | Admitting: Family

## 2021-07-10 DIAGNOSIS — I4819 Other persistent atrial fibrillation: Secondary | ICD-10-CM

## 2021-07-10 NOTE — Progress Notes (Signed)
Error- patient cancelled visit.   Loel Dubonnet, NP

## 2021-07-11 ENCOUNTER — Other Ambulatory Visit: Payer: Self-pay | Admitting: Student

## 2021-07-11 ENCOUNTER — Telehealth: Payer: Self-pay | Admitting: Student

## 2021-07-11 DIAGNOSIS — R110 Nausea: Secondary | ICD-10-CM

## 2021-07-11 NOTE — Telephone Encounter (Signed)
CM sent to Stephannie Peters at [facility] for  POC. F2F was 07/03/21.

## 2021-07-11 NOTE — Telephone Encounter (Signed)
Carola Rhine, RN; Eugenia Pancoast; Miquel Dunn; 1 other  Received! Thank you!

## 2021-07-16 ENCOUNTER — Other Ambulatory Visit: Payer: Self-pay | Admitting: Student

## 2021-07-16 NOTE — Telephone Encounter (Signed)
Called / informed pt rx was [facility] to [facility] on 07/13/[facility]. Stated she will call the pharmacy.

## 2021-07-16 NOTE — Telephone Encounter (Signed)
Refill Request-[place] go back and get it.  ondansetron (ZOFRAN) 4 MG tablet   [id number] [place] [facility] [address] (Ph: [phone]

## 2021-07-17 ENCOUNTER — Other Ambulatory Visit: Payer: Self-pay | Admitting: Student

## 2021-07-17 DIAGNOSIS — J449 Chronic obstructive pulmonary disease, unspecified: Secondary | ICD-10-CM

## 2021-07-17 DIAGNOSIS — J441 Chronic obstructive pulmonary disease with (acute) exacerbation: Secondary | ICD-10-CM

## 2021-07-17 MED ORDER — DULERA 200-5 MCG/ACT IN AERO: 1.0000 | INHALATION_SPRAY | Freq: Two times a day (BID) | RESPIRATORY_TRACT | 2 refills | Status: DC

## 2021-07-17 MED ORDER — ALBUTEROL SULFATE HFA 108 (90 BASE) MCG/ACT IN AERS: INHALATION_SPRAY | RESPIRATORY_TRACT | 1 refills | Status: DC

## 2021-07-17 MED ORDER — INCRUSE ELLIPTA 62.5 MCG/INH IN AEPB: INHALATION_SPRAY | RESPIRATORY_TRACT | 4 refills | Status: AC

## 2021-07-22 ENCOUNTER — Other Ambulatory Visit: Payer: Medicare Other

## 2021-07-24 NOTE — Progress Notes (Signed)
Internal Medicine Clinic Attending  Case discussed with Dr. Johnney Ou  At the time of the visit.  We reviewed the resident's history and exam and pertinent patient test results.  I agree with the assessment, diagnosis, and plan of care documented in the resident's note.

## 2021-07-25 ENCOUNTER — Other Ambulatory Visit: Payer: Self-pay | Admitting: Student

## 2021-07-26 ENCOUNTER — Other Ambulatory Visit: Payer: Self-pay | Admitting: Student

## 2021-08-01 ENCOUNTER — Other Ambulatory Visit (INDEPENDENT_AMBULATORY_CARE_PROVIDER_SITE_OTHER): Payer: Medicare Other

## 2021-08-01 DIAGNOSIS — E871 Hypo-osmolality and hyponatremia: Secondary | ICD-10-CM

## 2021-08-02 LAB — BMP8+ANION GAP
Anion Gap: 14 mmol/L (ref 10.0–18.0)
BUN/Creatinine Ratio: 14 (ref 12–28)
BUN: 8 mg/dL (ref 8–27)
CO2: 27 mmol/L (ref 20–29)
Calcium: 8.6 mg/dL — ABNORMAL LOW (ref 8.7–10.3)
Chloride: 94 mmol/L — ABNORMAL LOW (ref 96–106)
Creatinine, Ser: 0.59 mg/dL (ref 0.57–1.00)
Glucose: 70 mg/dL (ref 65–99)
Potassium: 4.1 mmol/L (ref 3.5–5.2)
Sodium: 135 mmol/L (ref 134–144)
eGFR: 93 mL/min/{1.73_m2} (ref >59)

## 2021-08-02 LAB — MAGNESIUM: Magnesium: 1.8 mg/dL (ref 1.6–2.3)

## 2021-08-04 ENCOUNTER — Other Ambulatory Visit: Payer: Self-pay

## 2021-08-04 ENCOUNTER — Other Ambulatory Visit: Payer: Self-pay | Admitting: Internal Medicine

## 2021-08-04 ENCOUNTER — Telehealth: Payer: Self-pay | Admitting: Student

## 2021-08-04 DIAGNOSIS — R110 Nausea: Secondary | ICD-10-CM

## 2021-08-04 DIAGNOSIS — L97902 Non-pressure chronic ulcer of unspecified part of unspecified lower leg with fat layer exposed: Secondary | ICD-10-CM

## 2021-08-04 DIAGNOSIS — F1190 Opioid use, unspecified, uncomplicated: Secondary | ICD-10-CM

## 2021-08-04 MED ORDER — OXYCODONE-ACETAMINOPHEN 10-325 MG PO TABS: 1.0000 | ORAL_TABLET | Freq: Three times a day (TID) | ORAL | 0 refills | Status: DC | PRN

## 2021-08-04 NOTE — Telephone Encounter (Signed)
Pt calling back again, requesting her pain med to be filled by today. Please call pt back to give her the update with her medication.

## 2021-08-04 NOTE — Telephone Encounter (Signed)
Pt would like Pecocet to be filled by today. States she is in pain, and really needs this medication.

## 2021-08-04 NOTE — Telephone Encounter (Signed)
Pt also requesting PERCOCET 10-325 refill Medication no longer on medication list (rx expired)  Last written 08/11 #90 Pt states she was due for refill yesterday

## 2021-08-04 NOTE — Telephone Encounter (Signed)
Received on-call page from Ms. Sherlynn Stalls. She states she talked with someone from [facility] earlier and requested a re-fill for Zofran and pain medication. Mentions the Zofran was filled, but her pain medication was not. PDMP reviewed, appropriate. Will re-fill oxycodone-acetaminophen 10-332m #90 [facility]d [facility] [address]  PSanjuan Dame MD Internal Medicine PGY-2 [phone]

## 2021-08-04 NOTE — Telephone Encounter (Signed)
ondansetron (ZOFRAN) 4 MG tablet, and Percocet to be filled @ [id number] [place] [address]

## 2021-08-05 MED ORDER — ONDANSETRON HCL 4 MG PO TABS: ORAL_TABLET | ORAL | 0 refills | Status: DC

## 2021-08-11 ENCOUNTER — Other Ambulatory Visit: Payer: Self-pay | Admitting: Student

## 2021-08-11 ENCOUNTER — Other Ambulatory Visit: Payer: Self-pay | Admitting: Internal Medicine

## 2021-08-11 DIAGNOSIS — R110 Nausea: Secondary | ICD-10-CM

## 2021-08-18 ENCOUNTER — Other Ambulatory Visit: Payer: Self-pay

## 2021-08-18 DIAGNOSIS — I480 Paroxysmal atrial fibrillation: Secondary | ICD-10-CM

## 2021-08-18 MED ORDER — RIVAROXABAN 15 MG PO TABS: 15.0000 mg | ORAL_TABLET | Freq: Every day | ORAL | 0 refills | Status: DC

## 2021-08-18 NOTE — Telephone Encounter (Signed)
umeclidinium bromide (INCRUSE ELLIPTA) 62.5 MCG/INH AEPB  mometasone-formoterol (DULERA) 200-5 MCG/ACT AERO  amiodarone (PACERONE) 200 MG tablet  XARELTO 15 MG TABS tablet  busPIRone (BUSPAR) 10 MG tablet  ondansetron (ZOFRAN) 4 MG tablet, refill request @ [id number] [place], [place] - [address]

## 2021-08-18 NOTE — Telephone Encounter (Signed)
RTC, patient informed she should have refills available on Incruse, Dulera, Amiodarone, Buspar and to call her pharmacy for refills.  She was notified that the ondansetron was sent via [facility] on 08/05/21, she states she did not intend to request this RX for refill.   Pt states she does need the Xarelto, she was going to start getting this from Cardiologist, but missed the appt d/t being in-patient hospitalization and rehab.  Will send Xarelto RX to PCP.  Pt encouraged to r/s w/ Cardio and she verbalized understanding. SChaplin, RN,BSN

## 2021-09-01 ENCOUNTER — Inpatient Hospital Stay (HOSPITAL_COMMUNITY)
Admission: EM | Admit: 2021-09-01 | Discharge: 2021-09-17 | DRG: 871 | Disposition: A | Discharge status: Skilled Nursing Facility | Payer: Medicare Other | Attending: Family Medicine | Admitting: Family Medicine

## 2021-09-01 ENCOUNTER — Emergency Department (HOSPITAL_COMMUNITY): Payer: Medicare Other

## 2021-09-01 ENCOUNTER — Other Ambulatory Visit: Payer: Self-pay

## 2021-09-01 DIAGNOSIS — Z681 Body mass index (BMI) 19 or less, adult: Secondary | ICD-10-CM

## 2021-09-01 DIAGNOSIS — J9601 Acute respiratory failure with hypoxia: Secondary | ICD-10-CM

## 2021-09-01 DIAGNOSIS — R6521 Severe sepsis with septic shock: Secondary | ICD-10-CM | POA: Diagnosis present

## 2021-09-01 DIAGNOSIS — J690 Pneumonitis due to inhalation of food and vomit: Secondary | ICD-10-CM | POA: Diagnosis not present

## 2021-09-01 DIAGNOSIS — L97919 Non-pressure chronic ulcer of unspecified part of right lower leg with unspecified severity: Secondary | ICD-10-CM | POA: Diagnosis present

## 2021-09-01 DIAGNOSIS — J189 Pneumonia, unspecified organism: Secondary | ICD-10-CM | POA: Diagnosis present

## 2021-09-01 DIAGNOSIS — Z8701 Personal history of pneumonia (recurrent): Secondary | ICD-10-CM

## 2021-09-01 DIAGNOSIS — I482 Chronic atrial fibrillation, unspecified: Secondary | ICD-10-CM | POA: Diagnosis present

## 2021-09-01 DIAGNOSIS — J9611 Chronic respiratory failure with hypoxia: Secondary | ICD-10-CM | POA: Diagnosis not present

## 2021-09-01 DIAGNOSIS — Z7901 Long term (current) use of anticoagulants: Secondary | ICD-10-CM

## 2021-09-01 DIAGNOSIS — J449 Chronic obstructive pulmonary disease, unspecified: Secondary | ICD-10-CM | POA: Diagnosis present

## 2021-09-01 DIAGNOSIS — I2723 Pulmonary hypertension due to lung diseases and hypoxia: Secondary | ICD-10-CM | POA: Diagnosis present

## 2021-09-01 DIAGNOSIS — E43 Unspecified severe protein-calorie malnutrition: Secondary | ICD-10-CM | POA: Diagnosis present

## 2021-09-01 DIAGNOSIS — I959 Hypotension, unspecified: Secondary | ICD-10-CM | POA: Diagnosis present

## 2021-09-01 DIAGNOSIS — I248 Other forms of acute ischemic heart disease: Secondary | ICD-10-CM | POA: Diagnosis present

## 2021-09-01 DIAGNOSIS — R778 Other specified abnormalities of plasma proteins: Secondary | ICD-10-CM

## 2021-09-01 DIAGNOSIS — R0902 Hypoxemia: Secondary | ICD-10-CM

## 2021-09-01 DIAGNOSIS — K5900 Constipation, unspecified: Secondary | ICD-10-CM | POA: Diagnosis present

## 2021-09-01 DIAGNOSIS — J9621 Acute and chronic respiratory failure with hypoxia: Secondary | ICD-10-CM | POA: Diagnosis present

## 2021-09-01 DIAGNOSIS — Z79899 Other long term (current) drug therapy: Secondary | ICD-10-CM

## 2021-09-01 DIAGNOSIS — E46 Unspecified protein-calorie malnutrition: Secondary | ICD-10-CM | POA: Diagnosis present

## 2021-09-01 DIAGNOSIS — I480 Paroxysmal atrial fibrillation: Secondary | ICD-10-CM | POA: Diagnosis present

## 2021-09-01 DIAGNOSIS — R64 Cachexia: Secondary | ICD-10-CM | POA: Diagnosis present

## 2021-09-01 DIAGNOSIS — Z66 Do not resuscitate: Secondary | ICD-10-CM | POA: Diagnosis present

## 2021-09-01 DIAGNOSIS — F411 Generalized anxiety disorder: Secondary | ICD-10-CM | POA: Diagnosis present

## 2021-09-01 DIAGNOSIS — E876 Hypokalemia: Secondary | ICD-10-CM | POA: Diagnosis present

## 2021-09-01 DIAGNOSIS — R197 Diarrhea, unspecified: Secondary | ICD-10-CM | POA: Diagnosis present

## 2021-09-01 DIAGNOSIS — Z8616 Personal history of COVID-19: Secondary | ICD-10-CM

## 2021-09-01 DIAGNOSIS — Z7951 Long term (current) use of inhaled steroids: Secondary | ICD-10-CM

## 2021-09-01 DIAGNOSIS — L97909 Non-pressure chronic ulcer of unspecified part of unspecified lower leg with unspecified severity: Secondary | ICD-10-CM | POA: Diagnosis present

## 2021-09-01 DIAGNOSIS — Z23 Encounter for immunization: Secondary | ICD-10-CM

## 2021-09-01 DIAGNOSIS — I872 Venous insufficiency (chronic) (peripheral): Secondary | ICD-10-CM | POA: Diagnosis present

## 2021-09-01 DIAGNOSIS — L97902 Non-pressure chronic ulcer of unspecified part of unspecified lower leg with fat layer exposed: Secondary | ICD-10-CM

## 2021-09-01 DIAGNOSIS — A419 Sepsis, unspecified organism: Secondary | ICD-10-CM | POA: Diagnosis not present

## 2021-09-01 DIAGNOSIS — R54 Age-related physical debility: Secondary | ICD-10-CM | POA: Diagnosis present

## 2021-09-01 DIAGNOSIS — Z9981 Dependence on supplemental oxygen: Secondary | ICD-10-CM

## 2021-09-01 DIAGNOSIS — L97929 Non-pressure chronic ulcer of unspecified part of left lower leg with unspecified severity: Secondary | ICD-10-CM | POA: Diagnosis present

## 2021-09-01 DIAGNOSIS — F1190 Opioid use, unspecified, uncomplicated: Secondary | ICD-10-CM

## 2021-09-01 DIAGNOSIS — E871 Hypo-osmolality and hyponatremia: Secondary | ICD-10-CM | POA: Diagnosis present

## 2021-09-01 DIAGNOSIS — Z87891 Personal history of nicotine dependence: Secondary | ICD-10-CM

## 2021-09-01 DIAGNOSIS — Z20822 Contact with and (suspected) exposure to covid-19: Secondary | ICD-10-CM | POA: Diagnosis present

## 2021-09-01 DIAGNOSIS — Z888 Allergy status to other drugs, medicaments and biological substances status: Secondary | ICD-10-CM

## 2021-09-01 DIAGNOSIS — I878 Other specified disorders of veins: Secondary | ICD-10-CM | POA: Diagnosis present

## 2021-09-01 DIAGNOSIS — I5033 Acute on chronic diastolic (congestive) heart failure: Secondary | ICD-10-CM | POA: Diagnosis present

## 2021-09-01 HISTORY — DX: Paroxysmal atrial fibrillation: I48.0

## 2021-09-01 LAB — URINALYSIS, ROUTINE W REFLEX MICROSCOPIC
Bilirubin Urine: NEGATIVE
Glucose, UA: NEGATIVE mg/dL
Hgb urine dipstick: NEGATIVE
Ketones, ur: NEGATIVE mg/dL
Leukocytes,Ua: NEGATIVE
Nitrite: NEGATIVE
Protein, ur: 30 mg/dL — AB
Specific Gravity, Urine: 1.025 (ref 1.005–1.030)
pH: 5 (ref 5.0–8.0)

## 2021-09-01 LAB — COMPREHENSIVE METABOLIC PANEL
ALT: 10 U/L (ref 0–44)
AST: 22 U/L (ref 15–41)
Albumin: 2.3 g/dL — ABNORMAL LOW (ref 3.5–5.0)
Alkaline Phosphatase: 85 U/L (ref 38–126)
Anion gap: 6 (ref 5–15)
BUN: 20 mg/dL (ref 8–23)
CO2: 30 mmol/L (ref 22–32)
Calcium: 7.9 mg/dL — ABNORMAL LOW (ref 8.9–10.3)
Chloride: 95 mmol/L — ABNORMAL LOW (ref 98–111)
Creatinine, Ser: 0.83 mg/dL (ref 0.44–1.00)
GFR, Estimated: >60 mL/min (ref >60)
Glucose, Bld: 92 mg/dL (ref 70–99)
Potassium: 4.1 mmol/L (ref 3.5–5.1)
Sodium: 131 mmol/L — ABNORMAL LOW (ref 135–145)
Total Bilirubin: 1.2 mg/dL (ref 0.3–1.2)
Total Protein: 6.1 g/dL — ABNORMAL LOW (ref 6.5–8.1)

## 2021-09-01 LAB — CBC WITH DIFFERENTIAL/PLATELET
Abs Immature Granulocytes: 0.17 10*3/uL — ABNORMAL HIGH (ref 0.00–0.07)
Basophils Absolute: 0 10*3/uL (ref 0.0–0.1)
Basophils Relative: 0 %
Eosinophils Absolute: 0 10*3/uL (ref 0.0–0.5)
Eosinophils Relative: 0 %
HCT: 35.4 % — ABNORMAL LOW (ref 36.0–46.0)
Hemoglobin: 11.1 g/dL — ABNORMAL LOW (ref 12.0–15.0)
Immature Granulocytes: 1 %
Lymphocytes Relative: 7 %
Lymphs Abs: 1.2 10*3/uL (ref 0.7–4.0)
MCH: 28.6 pg (ref 26.0–34.0)
MCHC: 31.4 g/dL (ref 30.0–36.0)
MCV: 91.2 fL (ref 80.0–100.0)
Monocytes Absolute: 1.3 10*3/uL — ABNORMAL HIGH (ref 0.1–1.0)
Monocytes Relative: 7 %
Neutro Abs: 14.9 10*3/uL — ABNORMAL HIGH (ref 1.7–7.7)
Neutrophils Relative %: 85 %
Platelets: 501 10*3/uL — ABNORMAL HIGH (ref 150–400)
RBC: 3.88 MIL/uL (ref 3.87–5.11)
RDW: 14.1 % (ref 11.5–15.5)
WBC: 17.5 10*3/uL — ABNORMAL HIGH (ref 4.0–10.5)
nRBC: 0 % (ref 0.0–0.2)

## 2021-09-01 LAB — PROTIME-INR
INR: 1.9 — ABNORMAL HIGH (ref 0.8–1.2)
Prothrombin Time: 21.9 seconds — ABNORMAL HIGH (ref 11.4–15.2)

## 2021-09-01 LAB — RESP PANEL BY RT-PCR (FLU A&B, COVID) ARPGX2
Influenza A by PCR: NEGATIVE
Influenza B by PCR: NEGATIVE
SARS Coronavirus 2 by RT PCR: NEGATIVE

## 2021-09-01 LAB — APTT: aPTT: 53 seconds — ABNORMAL HIGH (ref 24–36)

## 2021-09-01 LAB — LACTIC ACID, PLASMA
Lactic Acid, Venous: 1.9 mmol/L (ref 0.5–1.9)
Lactic Acid, Venous: 0.9 mmol/L (ref 0.5–1.9)

## 2021-09-01 MED ORDER — AZITHROMYCIN 500 MG IV SOLR
500.0000 mg | INTRAVENOUS | Status: AC
  Administered 2021-09-01: 500 mg via INTRAVENOUS
  Filled 2021-09-01: qty 500

## 2021-09-01 MED ORDER — SODIUM CHLORIDE 0.9 % IV SOLN: 500.0000 mg | INTRAVENOUS | Status: DC

## 2021-09-01 MED ORDER — ALBUTEROL SULFATE HFA 108 (90 BASE) MCG/ACT IN AERS: 2.0000 | INHALATION_SPRAY | RESPIRATORY_TRACT | Status: DC | PRN

## 2021-09-01 MED ORDER — SODIUM CHLORIDE 0.9 % IV SOLN: 1.0000 g | Freq: Once | INTRAVENOUS | Status: DC

## 2021-09-01 MED ORDER — SODIUM CHLORIDE 0.9 % IV SOLN
1.0000 g | INTRAVENOUS | Status: AC
  Administered 2021-09-01: 1 g via INTRAVENOUS
  Filled 2021-09-01: qty 10

## 2021-09-01 MED ORDER — OXYCODONE-ACETAMINOPHEN 5-325 MG PO TABS
1.0000 | ORAL_TABLET | Freq: Once | ORAL | Status: AC
  Administered 2021-09-01: 1 via ORAL
  Filled 2021-09-01: qty 1

## 2021-09-01 MED ORDER — MOMETASONE FURO-FORMOTEROL FUM 200-5 MCG/ACT IN AERO
1.0000 | INHALATION_SPRAY | Freq: Two times a day (BID) | RESPIRATORY_TRACT | Status: DC
  Administered 2021-09-01 – 2021-09-04 (×6): 1 via RESPIRATORY_TRACT
  Filled 2021-09-01: qty 8.8

## 2021-09-01 MED ORDER — OXYCODONE-ACETAMINOPHEN 10-325 MG PO TABS: 1.0000 | ORAL_TABLET | Freq: Three times a day (TID) | ORAL | Status: DC | PRN

## 2021-09-01 MED ORDER — OXYCODONE-ACETAMINOPHEN 5-325 MG PO TABS
1.0000 | ORAL_TABLET | Freq: Three times a day (TID) | ORAL | Status: AC | PRN
  Administered 2021-09-01 – 2021-09-02 (×2): 1 via ORAL
  Filled 2021-09-01 (×2): qty 1

## 2021-09-01 MED ORDER — LACTATED RINGERS IV SOLN: INTRAVENOUS | Status: AC

## 2021-09-01 MED ORDER — SODIUM CHLORIDE 0.9 % IV SOLN: 2.0000 g | INTRAVENOUS | Status: DC

## 2021-09-01 MED ORDER — FLUTICASONE PROPIONATE 50 MCG/ACT NA SUSP
1.0000 | Freq: Every day | NASAL | Status: DC | PRN
  Filled 2021-09-01: qty 16

## 2021-09-01 MED ORDER — ENOXAPARIN SODIUM 40 MG/0.4ML IJ SOSY: 40.0000 mg | PREFILLED_SYRINGE | INTRAMUSCULAR | Status: DC

## 2021-09-01 MED ORDER — OXYCODONE HCL 5 MG PO TABS
5.0000 mg | ORAL_TABLET | Freq: Three times a day (TID) | ORAL | Status: AC | PRN
  Administered 2021-09-01 – 2021-09-02 (×2): 5 mg via ORAL
  Filled 2021-09-01 (×2): qty 1

## 2021-09-01 MED ORDER — ONDANSETRON HCL 4 MG/2ML IJ SOLN
4.0000 mg | Freq: Four times a day (QID) | INTRAMUSCULAR | Status: DC | PRN
  Administered 2021-09-04 – 2021-09-17 (×12): 4 mg via INTRAVENOUS
  Filled 2021-09-01 (×12): qty 2

## 2021-09-01 MED ORDER — BUSPIRONE HCL 10 MG PO TABS
10.0000 mg | ORAL_TABLET | Freq: Every day | ORAL | Status: DC
  Administered 2021-09-02 – 2021-09-17 (×16): 10 mg via ORAL
  Filled 2021-09-01 (×16): qty 1

## 2021-09-01 MED ORDER — SENNOSIDES-DOCUSATE SODIUM 8.6-50 MG PO TABS
1.0000 | ORAL_TABLET | Freq: Every evening | ORAL | Status: DC | PRN
  Filled 2021-09-01: qty 1

## 2021-09-01 MED ORDER — ACETAMINOPHEN 650 MG RE SUPP: 650.0000 mg | Freq: Four times a day (QID) | RECTAL | Status: DC | PRN

## 2021-09-01 MED ORDER — ADULT MULTIVITAMIN W/MINERALS CH
1.0000 | ORAL_TABLET | Freq: Every day | ORAL | Status: DC
  Administered 2021-09-02 – 2021-09-17 (×15): 1 via ORAL
  Filled 2021-09-01 (×16): qty 1

## 2021-09-01 MED ORDER — UMECLIDINIUM BROMIDE 62.5 MCG/INH IN AEPB
1.0000 | INHALATION_SPRAY | Freq: Every day | RESPIRATORY_TRACT | Status: DC
  Administered 2021-09-02: 1 via RESPIRATORY_TRACT
  Filled 2021-09-01: qty 7

## 2021-09-01 MED ORDER — MIRTAZAPINE 15 MG PO TABS
15.0000 mg | ORAL_TABLET | Freq: Every evening | ORAL | Status: DC | PRN
  Administered 2021-09-01 – 2021-09-16 (×14): 15 mg via ORAL
  Filled 2021-09-01 (×5): qty 1
  Filled 2021-09-01: qty 2
  Filled 2021-09-01 (×8): qty 1

## 2021-09-01 MED ORDER — ONDANSETRON HCL 4 MG PO TABS
4.0000 mg | ORAL_TABLET | Freq: Four times a day (QID) | ORAL | Status: DC | PRN
  Filled 2021-09-01 (×3): qty 1

## 2021-09-01 MED ORDER — ALBUTEROL SULFATE (2.5 MG/3ML) 0.083% IN NEBU
2.5000 mg | INHALATION_SOLUTION | RESPIRATORY_TRACT | Status: DC | PRN
  Administered 2021-09-02: 2.5 mg via RESPIRATORY_TRACT
  Filled 2021-09-01: qty 3

## 2021-09-01 MED ORDER — ACETAMINOPHEN 325 MG PO TABS
650.0000 mg | ORAL_TABLET | Freq: Four times a day (QID) | ORAL | Status: DC | PRN
  Administered 2021-09-01 – 2021-09-15 (×7): 650 mg via ORAL
  Filled 2021-09-01 (×8): qty 2

## 2021-09-01 MED ORDER — IPRATROPIUM-ALBUTEROL 0.5-2.5 (3) MG/3ML IN SOLN
3.0000 mL | Freq: Four times a day (QID) | RESPIRATORY_TRACT | Status: DC | PRN
  Administered 2021-09-02: 3 mL via RESPIRATORY_TRACT
  Filled 2021-09-01: qty 3

## 2021-09-01 MED ORDER — SODIUM CHLORIDE 0.9 % IV SOLN
3.0000 g | Freq: Three times a day (TID) | INTRAVENOUS | Status: DC
  Administered 2021-09-01 – 2021-09-02 (×2): 3 g via INTRAVENOUS
  Filled 2021-09-01 (×2): qty 8

## 2021-09-01 MED ORDER — AMIODARONE HCL 200 MG PO TABS
200.0000 mg | ORAL_TABLET | Freq: Every day | ORAL | Status: DC
  Administered 2021-09-02 – 2021-09-17 (×15): 200 mg via ORAL
  Filled 2021-09-01 (×16): qty 1

## 2021-09-01 MED ORDER — RIVAROXABAN 15 MG PO TABS
15.0000 mg | ORAL_TABLET | Freq: Every day | ORAL | Status: DC
  Administered 2021-09-02 – 2021-09-17 (×16): 15 mg via ORAL
  Filled 2021-09-01 (×16): qty 1

## 2021-09-01 MED ORDER — OXYCODONE HCL 5 MG PO TABS
5.0000 mg | ORAL_TABLET | Freq: Once | ORAL | Status: AC
  Administered 2021-09-01: 5 mg via ORAL
  Filled 2021-09-01: qty 1

## 2021-09-01 NOTE — ED Triage Notes (Signed)
Pt BIBA from home- pt c/o diarrhea since 10/5, as well as productive cough and right sided chest pain from coughing. Pt wears 3L O2 via [place] at baseline. Reports chills, denies known fever. Pt c/o generalized weakness since last week.

## 2021-09-01 NOTE — H&P (Signed)
History and Physical    Kristen [name] KJZ:[id number] DOB: 1946/08/25 DOA: 09/01/2021  PCP: [name], MD  Patient coming from: home  I have personally briefly reviewed patient's old medical records in [place]  Chief Complaint: cough, chills, diarrhea  HPI: Kristen [name] is a 75 y.o. female with medical history significant of COPD with chronic hypoxic respiratory failure on 3 L/min oxygen at baseline, chronic A-fib, venous insufficiency, hx of transient hypotension, hospital admission in July of this year for aspiration pneumonia and acute on chronic hypoxic respiratory failure.  She was brought to [facility] ED from home by EMS due to ongoing diarrhea x 5 days, productive cough with associated right-sided chest pain and chills, with progressive generalized weakness.  Denies abdominal pain, nausea, vomiting, dysuria, focal weakness numbness or tingling, headaches or other recent symptoms.  Reports increasing her oxygen briefly to 3.5 L/min mostly due to feeling anxious.  Otherwise has been on her usual 3 L/min.   ED Course: afebrile, stable vitals but soft blood pressures of 106/76 and later 98/64.  Labs notable for leukocytosis 17.5k, mild hyponatremia 131.  Chest xray showed new extensive consolidation in the lateral right middle and lower lung, consistent with PNA, small right pleural effusion and chronic hyperexpansion compatible with emphysema.    Patient was started on IV antibiotics and admitted for further evaluation and management.     Patient is followed by the IM residency service at [facility], but this states she would prefer to be admitted here to [facility] this time.   Review of Systems: As per HPI otherwise 10 point review of systems negative.    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 Her smoking use included cigarettes. She smoked an average of .25 packs per day. She has never used smokeless tobacco. She reports current alcohol use. She reports that she does not use drugs.  Allergies  Allergen Reactions   Gabapentin Other (See Comments)    Hallucination/ nightmares    Family History  Problem Relation Age of Onset   Diabetes Mother    Diabetes Mellitus II Mother    Diabetes Father    Diverticulitis Sister      Prior to Admission medications   Medication Sig Start Date End Date Taking? Authorizing Provider  albuterol (VENTOLIN HFA) 108 (90 Base) MCG/ACT inhaler INHALE 1-2 PUFFS BY MOUTH EVERY 6 HOURS AS NEEDED FOR WHEEZE OR SHORTNESS OF BREATH 07/17/21   Marianna Payment, MD  amiodarone (PACERONE) 200 MG tablet TAKE 1 TABLET BY MOUTH EVERY DAY 08/05/21   [name], MD  busPIRone (BUSPAR) 10 MG tablet Take 1 tablet (10 mg total) by mouth daily. 07/03/21   Katsadouros, Vasilios, MD  docusate sodium (COLACE) 100 MG capsule Take 1 capsule (100 mg total) by mouth 2 (two) times daily as needed for mild constipation. 06/03/21   Maudie Mercury, MD  fluticasone [facility]) 50 MCG/ACT nasal spray Place 1 spray into both nostrils daily as needed for allergies or rhinitis. 03/31/21   Riesa Pope, MD  ipratropium-albuterol (DUONEB) 0.5-2.5 (  3) MG/3ML SOLN TAKE 3 MLS BY NEBULIZATION EVERY 6 (SIX) HOURS AS NEEDED (SHORTNESS OF BREATH/WHEEZING.). 07/31/21 10/29/21  [name], MD  mirtazapine (REMERON) 15 MG tablet Take 1 tablet (15 mg total) by mouth at bedtime as needed (sleep). 03/18/21   Mosetta Anis, MD  mometasone-formoterol (DULERA) 200-5 MCG/ACT AERO Inhale 1 puff into the lungs in the morning and at bedtime. 07/17/21   Marianna Payment, MD  Multiple Vitamin  (MULTIVITAMIN WITH MINERALS) TABS tablet Take 1 tablet by mouth daily.    [provider]  ondansetron (ZOFRAN) 4 MG tablet TAKE 1 TABLET BY MOUTH DAILY AS NEEDED FOR NAUSEA OR VOMITING. 08/05/21   [name], MD  oxyCODONE-acetaminophen (PERCOCET) 10-325 MG tablet Take 1 tablet by mouth every 8 (eight) hours as needed for pain. 08/04/21 09/03/21  [name], MD  OXYGEN Inhale 3 L/min into the lungs.    [provider]  polyethylene glycol (MIRALAX / GLYCOLAX) 17 g packet Take 17 g by mouth daily as needed for moderate constipation. 06/05/21   Maudie Mercury, MD  Rivaroxaban (XARELTO) 15 MG TABS tablet Take 1 tablet (15 mg total) by mouth daily. 08/18/21   [name], MD  umeclidinium bromide (INCRUSE ELLIPTA) 62.5 MCG/INH AEPB INHALE 1 PUFF BY MOUTH EVERY DAY 07/17/21   Marianna Payment, MD    Physical Exam: Vitals:   09/01/21 1312 09/01/21 1325 09/01/21 1343 09/01/21 1500  BP: 106/76   98/64  Pulse: 68   66  Resp: 20   15  Temp: 97.8 F (36.6 C)     TempSrc: Oral     SpO2: 100% 99% 99% 100%     Constitutional: NAD, calm, comfortable, frail and underweight Eyes: EOMI, lids and conjunctivae normal ENMT: Mucous membranes are moist.  Hearing grossly normal Respiratory: Diminished throughout, no, right-sided crackles, diminished bases. Normal respiratory effort. No accessory muscle use.  On 3 the minute nasal cannula oxygen Cardiovascular: RRR. No extremity edema. 2+ pedal pulses. No carotid bruits.  Abdomen: Sunken abdomen, nontender, no masses or HSM palpated. +Bowel sounds.  Musculoskeletal: Diffuse muscular atrophy, no clubbing / cyanosis. Normal muscle tone.  Skin: dry, intact, normal color, normal temperature Neurologic: CN 2-12 grossly intact. Normal speech.  Grossly non-focal exam. Psychiatric: Alert and oriented x 3. Normal mood. Congruent affect.  Normal judgement and insight.   Labs on Admission: I have personally reviewed following labs and  imaging studies  CBC: Recent Labs  Lab 09/01/21 1415  WBC 17.5*  NEUTROABS 14.9*  HGB 11.1*  HCT 35.4*  MCV 91.2  PLT 478*   Basic Metabolic Panel: Recent Labs  Lab 09/01/21 1415  NA 131*  K 4.1  CL 95*  CO2 30  GLUCOSE 92  BUN 20  CREATININE 0.83  CALCIUM 7.9*   GFR: CrCl cannot be calculated (Unknown ideal weight.). Liver Function Tests: Recent Labs  Lab 09/01/21 1415  AST 22  ALT 10  ALKPHOS 85  BILITOT 1.2  PROT 6.1*  ALBUMIN 2.3*   No results for input(s): LIPASE, AMYLASE in the last 168 hours. No results for input(s): AMMONIA in the last 168 hours. Coagulation Profile: Recent Labs  Lab 09/01/21 1415  INR 1.9*   Cardiac Enzymes: No results for input(s): CKTOTAL, CKMB, CKMBINDEX, TROPONINI in the last 168 hours. BNP (last 3 results) No results for input(s): PROBNP in the last 8760 hours. HbA1C: No results for input(s): HGBA1C in the last 72 hours. CBG: No results for input(s): GLUCAP in the last 168 hours. Lipid  Profile: No results for input(s): CHOL, HDL, LDLCALC, TRIG, CHOLHDL, LDLDIRECT in the last 72 hours. Thyroid Function Tests: No results for input(s): TSH, T4TOTAL, FREET4, T3FREE, THYROIDAB in the last 72 hours. Anemia Panel: No results for input(s): VITAMINB12, FOLATE, FERRITIN, TIBC, IRON, RETICCTPCT in the last 72 hours. Urine analysis:    Component Value Date/Time   COLORURINE AMBER (A) 05/28/2021 0656   APPEARANCEUR CLEAR 05/28/2021 0656   LABSPEC 1.024 05/28/2021 0656   PHURINE 5.0 05/28/2021 0656   GLUCOSEU NEGATIVE 05/28/2021 0656   HGBUR NEGATIVE 05/28/2021 0656   [place] 05/28/2021 0656   [place] 05/28/2021 0656   PROTEINUR NEGATIVE 05/28/2021 0656   NITRITE NEGATIVE 05/28/2021 0656   LEUKOCYTESUR NEGATIVE 05/28/2021 0656    Radiological Exams on Admission: DG Chest Port 1 View  Result Date: 09/01/2021 CLINICAL DATA:  Questionable sepsis - evaluate for abnormality EXAM: PORTABLE CHEST 1 VIEW  COMPARISON:  05/28/2021. FINDINGS: New extensive consolidation in the lateral right midlung and lung base. Increased conspicuity of right greater than left interstitial opacities. Chronic hyperexpansion, compatible with emphysema. Small right pleural effusion. No visible pneumothorax. Cardiomediastinal silhouette is within normal limits and similar to prior. IMPRESSION: 1. New extensive consolidation in the lateral right midlung and lung base, concerning for pneumonia. Followup PA and lateral chest X-ray is recommended in 3-4 weeks following trial of antibiotic therapy to ensure resolution and exclude underlying malignancy. 2. Increased conspicuity of right greater than left interstitial opacities, which probably represents infection given above findings. Asymmetric edema is a consideration. 3. Small right pleural effusion. 4. Chronic hyperexpansion, compatible with emphysema. Electronically Signed   By: Margaretha Sheffield M.D.   On: 09/01/2021 14:18    EKG: Independently reviewed.  Normal sinus rhythm at 68 bpm.  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  Does have prior history of aspiration pneumonia, admitted in July this year. Met sepsis criteria on admission with leukocytosis, tachypnea in setting of pneumonia. Treated with Rocephin and Zithromax in the ED. --Change antibiotic to Unasyn  --SLP evaluation --Continue IV fluids, reduce rate for maintenance --Check Legionella and strep  pneumo urinary antigens  COPD with chronic respiratory failure with hypoxia -at baseline, patient requires 3 L/min supplemental O2.  Oxygen requirement at baseline upon admission. COPD stable on admission without expiratory wheezes or signs of acute exacerbation. --Supplement O2, maintain sat above 88% --Continue home regimen with Incruse, Dulera, DuoNebs and as needed albuterol nebs and HFA --Monitor for wheezing or signs of exacerbation --Antibiotics as above  Discharge summary from July admission reviewed.  Appears palliative care was to follow after discharge but patient says this did not occur.  CODE STATUS discussion with patient and she would prefer all resuscitation efforts except for intubation.  CODE STATUS changed to partial -DO NOT INTUBATE.  BiPAP okay.   Diarrhea -patient reported 5 days of diarrhea at home. --Possibly secondary to pneumonia ?  Legionella --Check C. Diff and GI panel --Hold off on Imodium until C. difficile ruled out   Paroxysmal A. Fib -rate controlled upon admission.  Appears she takes amiodarone and Xarelto. --Continue amiodarone and Xarelto   Prolonged QTC - 561 on admission, possibly due to amiodarone.   --avoid other QT prolonging agents --Serial EKGs to monitor --Telemetry --Maintain K>4 and Mg>2   Chronic venous stasis of bilateral lower extremities -patient's left leg is wrapped.  She has wound care nursing to follow this.  WOC consult.   Chronic hyponatremia -Per chart review, baseline sodium 1 28-1 30.  Sodium on admission 131.  Monitor BMP.   DVT prophylaxis: enoxaparin (LOVENOX) injection 40 mg Start: 09/01/21 1600 Rivaroxaban (XARELTO) tablet 15 mg   Code Status: DO NOT INTUBATE Family Communication: None at bedside during admission encounter Disposition Plan: Anticipate discharge home in 24 to 48 hours Consults called: None  Status is: Observation  The patient remains OBS appropriate and will d/c before 2  midnights.  Dispo: The patient is from: Home              Anticipated d/c is to: Home              Patient currently is not medically stable to d/c.   Difficult to place patient No       Ezekiel Slocumb, DO [facility]  09/01/2021, 5:13 PM    If 7PM-7AM, please contact night-coverage. How to contact the [facility] Attending or Consulting provider [place] or covering provider during after hours [place], for this patient?    Check the care team in [facility] and look for a) attending/consulting [facility] provider listed and b) the [facility] team listed Log into [email or web address] and use [place]'s universal password to access. If you do not have the password, please contact the hospital operator. Locate the [facility] provider you are looking for under [facility] and page to a number that you can be directly reached. If you still have difficulty reaching the provider, please page the [facility] (Director on Call) for the Hospitalists listed on amion for assistance.

## 2021-09-01 NOTE — ED Notes (Signed)
Pt provided dinner tray.

## 2021-09-01 NOTE — ED Notes (Signed)
Attempted to obtain urine sample. Suction canister empty at this time. Will re-attempt at a later time.

## 2021-09-01 NOTE — Progress Notes (Signed)
Pharmacy Antibiotic Note  Destry Bezdek is a 75 y.o. female admitted on 09/01/2021 with pneumonia, previous hx of aspiration PNA.  Pharmacy has been consulted for Unasyn dosing.  Plan: Unasyn 3gm q8     Temp (24hrs), Avg:97.8 F (36.6 C), Min:97.8 F (36.6 C), Max:97.8 F (36.6 C)  Recent Labs  Lab 09/01/21 1402 09/01/21 1415 09/01/21 1601  WBC  --  17.5*  --   CREATININE  --  0.83  --   LATICACIDVEN 1.9  --  0.9    CrCl cannot be calculated (Unknown ideal weight.).    Allergies  Allergen Reactions   Gabapentin Other (See Comments)    Hallucination/ nightmares    Antimicrobials this admission: 10/10 CTX/Azith x1 10/11 Unasyn >>  Dose adjustments this admission:  Microbiology results: 10/10 BCx x1 set: sent Legionella & Strep pneumo ordered CDiff ordered GI panel ordered Sputum cx ordered  Thank you for allowing pharmacy to be a part of this patient's care.  Minda Ditto PharmD 09/01/2021 6:16 PM

## 2021-09-01 NOTE — ED Provider Notes (Signed)
[place] DEPT Provider Note   CSN: [id number] Arrival date & time: 09/01/21  1303     History No chief complaint on file.   Kristen [name] is a 75 y.o. female.  75 year old female presents with diarrhea x1 week.  Patient has had increasing weakness associated with this.  She has had watery diarrhea without blood.  No fever or chills.  Some abdominal cramping.  Has had increasing cough and does have a history of COPD.  Notes weakness when she stands up.  Has had some increased cough and right-sided chest pain is worse with coughing.  Does use oxygen chronically.      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 [facility])     Patient Active Problem List   Diagnosis Date Noted   Hypotension    Pressure injury of skin 06/08/2021   CAP (community acquired pneumonia) 05/29/2021   Aspiration pneumonia ([place]) 05/29/2021   Atrial fibrillation with RVR ([place]) 05/27/2021   Cellulitis of left lower extremity 05/06/2021   Aortic atherosclerosis ([place]) 03/07/2021   Opiate use 03/05/2021   Need for assessment by dentistry for poor dentition 03/05/2021   Abdominal pain 02/06/2021   Health care maintenance 12/27/2020   Paroxysmal atrial fibrillation with rapid ventricular response ([place]) 12/16/2020   Nausea 11/07/2020   Lower extremity ulceration ([place]) 10/04/2020   Generalized anxiety disorder 08/27/2020   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     Family History  Problem Relation Age of Onset   Diabetes Mother    Diabetes Mellitus II Mother    Diabetes Father    Diverticulitis Sister     Social History   Tobacco Use   Smoking status: Former    Packs/day: 0.25    Types: Cigarettes    Quit date: 10/16/2019    Years since quitting: 1.8   Smokeless tobacco: Never   Tobacco comments:    2-3 per day   Vaping Use   Vaping Use: Never used  Substance Use Topics   Alcohol use: Yes    Comment: occasionally   Drug use: Never    Home Medications Prior to Admission medications   Medication Sig Start Date End Date Taking? Authorizing Provider  albuterol (VENTOLIN HFA) 108 (90 Base) MCG/ACT inhaler INHALE 1-2 PUFFS BY MOUTH EVERY 6 HOURS AS NEEDED FOR WHEEZE OR SHORTNESS OF BREATH 07/17/21   Marianna Payment, MD  amiodarone (PACERONE) 200 MG tablet TAKE 1 TABLET BY MOUTH EVERY DAY 08/05/21   [name], MD  busPIRone (BUSPAR) 10 MG tablet Take 1 tablet (10 mg total) by mouth daily. 07/03/21   Katsadouros, Vasilios, MD  docusate sodium (COLACE) 100 MG capsule Take 1 capsule (100 mg total) by mouth 2 (two) times daily as needed for mild constipation. 06/03/21   Maudie Mercury, MD  fluticasone [facility]) 50 MCG/ACT nasal spray Place 1 spray  into both nostrils daily as needed for allergies or rhinitis. 03/31/21   Katsadouros, Vasilios, MD  ipratropium-albuterol (DUONEB) 0.5-2.5 (3) MG/3ML SOLN TAKE 3 MLS BY NEBULIZATION EVERY 6 (SIX) HOURS AS NEEDED (SHORTNESS OF BREATH/WHEEZING.). 07/31/21 10/29/21  [name], MD  mirtazapine (REMERON) 15 MG tablet Take 1 tablet (15 mg total) by mouth at bedtime as needed (sleep). 03/18/21   Mosetta Anis, MD  mometasone-formoterol (DULERA) 200-5 MCG/ACT AERO Inhale 1 puff into the lungs in the morning and at bedtime. 07/17/21   Marianna Payment, MD  Multiple Vitamin (MULTIVITAMIN WITH MINERALS) TABS tablet Take 1 tablet by mouth daily.     [provider]  ondansetron (ZOFRAN) 4 MG tablet TAKE 1 TABLET BY MOUTH DAILY AS NEEDED FOR NAUSEA OR VOMITING. 08/05/21   [name], MD  oxyCODONE-acetaminophen (PERCOCET) 10-325 MG tablet Take 1 tablet by mouth every 8 (eight) hours as needed for pain. 08/04/21 09/03/21  [name], MD  OXYGEN Inhale 3 L/min into the lungs.    [provider]  polyethylene glycol (MIRALAX / GLYCOLAX) 17 g packet Take 17 g by mouth daily as needed for moderate constipation. 06/05/21   Maudie Mercury, MD  Rivaroxaban (XARELTO) 15 MG TABS tablet Take 1 tablet (15 mg total) by mouth daily. 08/18/21   [name], MD  umeclidinium bromide (INCRUSE ELLIPTA) 62.5 MCG/INH AEPB INHALE 1 PUFF BY MOUTH EVERY DAY 07/17/21   Marianna Payment, MD    Allergies    Gabapentin  Review of Systems   Review of Systems  All other systems reviewed and are negative.  Physical Exam Updated Vital Signs BP 106/76 (BP Location: Right Arm)   Pulse 68   Temp 97.8 F (36.6 C) (Oral)   Resp 20   SpO2 99%   Physical Exam Vitals and nursing note reviewed.  Constitutional:      General: She is not in acute distress.    Appearance: Normal appearance. She is well-developed. She is not toxic-appearing.  HENT:     Head: Normocephalic and atraumatic.  Eyes:     General: Lids are normal.     Conjunctiva/sclera: Conjunctivae normal.     Pupils: Pupils are equal, round, and reactive to light.  Neck:     Thyroid: No thyroid mass.     Trachea: No tracheal deviation.  Cardiovascular:     Rate and Rhythm: Normal rate and regular rhythm.     Heart sounds: Normal heart sounds. No murmur heard.   No gallop.  Pulmonary:     Effort: Pulmonary effort is normal. No respiratory distress.     Breath sounds: Normal breath sounds. No stridor. No decreased breath sounds, wheezing, rhonchi or rales.  Abdominal:     General: There is no distension.     Palpations: Abdomen is soft.     Tenderness: There is no  abdominal tenderness. There is no rebound.  Musculoskeletal:        General: No tenderness. Normal range of motion.     Cervical back: Normal range of motion and neck supple.  Skin:    General: Skin is warm and dry.     Findings: No abrasion or rash.  Neurological:     Mental Status: She is alert and oriented to person, place, and time. Mental status is at baseline.     GCS: GCS eye subscore is 4. GCS verbal subscore is 5. GCS motor subscore is 6.     Cranial Nerves: Cranial nerves are intact. No cranial nerve deficit.  Sensory: No sensory deficit.     Motor: Motor function is intact.  Psychiatric:        Attention and Perception: Attention normal.        Speech: Speech normal.        Behavior: Behavior normal.    ED Results / Procedures / Treatments   Labs (all labs ordered are listed, but only abnormal results are displayed) Labs Reviewed  CULTURE, BLOOD (SINGLE)  URINE CULTURE  RESP PANEL BY RT-PCR (FLU A&B, COVID) ARPGX2  LACTIC ACID, PLASMA  LACTIC ACID, PLASMA  COMPREHENSIVE METABOLIC PANEL  CBC WITH DIFFERENTIAL/PLATELET  PROTIME-INR  APTT  URINALYSIS, ROUTINE W REFLEX MICROSCOPIC    EKG EKG Interpretation  Date/Time:  Monday September 01 2021 13:58:45 EDT Ventricular Rate:  68 PR Interval:  185 QRS Duration: 106 QT Interval:  527 QTC Calculation: 561 R Axis:   80 Text Interpretation: Sinus rhythm Abnormal T, consider ischemia, diffuse leads Prolonged QT interval Confirmed by Lacretia Leigh ([id number]) on 09/01/2021 3:21:36 PM  Radiology No results found.  Procedures Procedures   Medications Ordered in ED Medications  lactated ringers infusion (has no administration in time range)  oxyCODONE-acetaminophen (PERCOCET/ROXICET) 5-325 MG per tablet 1 tablet (has no administration in time range)    And  oxyCODONE (Oxy IR/ROXICODONE) immediate release tablet 5 mg (has no administration in time range)    ED Course  I have reviewed the triage vital signs and  the nursing notes.  Pertinent labs & imaging results that were available during my care of the patient were reviewed by me and considered in my medical decision making (see chart for details).    MDM Rules/Calculators/A&P                           Patient's chest x-ray consistent with pneumonia.  Patient has leukocytosis on her CBC.  Started on IV antibiotics.  Will admit to the hospital service Final Clinical Impression(s) / ED Diagnoses Final diagnoses:  None    Rx / DC Orders ED Discharge Orders     None        Lacretia Leigh, MD 09/01/21 [phone]

## 2021-09-02 DIAGNOSIS — R0602 Shortness of breath: Secondary | ICD-10-CM | POA: Diagnosis not present

## 2021-09-02 DIAGNOSIS — I5033 Acute on chronic diastolic (congestive) heart failure: Secondary | ICD-10-CM | POA: Diagnosis present

## 2021-09-02 DIAGNOSIS — I5040 Unspecified combined systolic (congestive) and diastolic (congestive) heart failure: Secondary | ICD-10-CM | POA: Diagnosis not present

## 2021-09-02 DIAGNOSIS — F411 Generalized anxiety disorder: Secondary | ICD-10-CM

## 2021-09-02 DIAGNOSIS — L97929 Non-pressure chronic ulcer of unspecified part of left lower leg with unspecified severity: Secondary | ICD-10-CM | POA: Diagnosis present

## 2021-09-02 DIAGNOSIS — Z7189 Other specified counseling: Secondary | ICD-10-CM | POA: Diagnosis not present

## 2021-09-02 DIAGNOSIS — I872 Venous insufficiency (chronic) (peripheral): Secondary | ICD-10-CM

## 2021-09-02 DIAGNOSIS — Z20822 Contact with and (suspected) exposure to covid-19: Secondary | ICD-10-CM | POA: Diagnosis present

## 2021-09-02 DIAGNOSIS — J9621 Acute and chronic respiratory failure with hypoxia: Secondary | ICD-10-CM | POA: Diagnosis present

## 2021-09-02 DIAGNOSIS — E871 Hypo-osmolality and hyponatremia: Secondary | ICD-10-CM

## 2021-09-02 DIAGNOSIS — I2723 Pulmonary hypertension due to lung diseases and hypoxia: Secondary | ICD-10-CM | POA: Diagnosis present

## 2021-09-02 DIAGNOSIS — L97919 Non-pressure chronic ulcer of unspecified part of right lower leg with unspecified severity: Secondary | ICD-10-CM | POA: Diagnosis present

## 2021-09-02 DIAGNOSIS — R197 Diarrhea, unspecified: Secondary | ICD-10-CM | POA: Diagnosis present

## 2021-09-02 DIAGNOSIS — Z66 Do not resuscitate: Secondary | ICD-10-CM | POA: Diagnosis present

## 2021-09-02 DIAGNOSIS — R64 Cachexia: Secondary | ICD-10-CM | POA: Diagnosis present

## 2021-09-02 DIAGNOSIS — E876 Hypokalemia: Secondary | ICD-10-CM | POA: Diagnosis present

## 2021-09-02 DIAGNOSIS — R778 Other specified abnormalities of plasma proteins: Secondary | ICD-10-CM | POA: Diagnosis not present

## 2021-09-02 DIAGNOSIS — J9601 Acute respiratory failure with hypoxia: Secondary | ICD-10-CM | POA: Diagnosis not present

## 2021-09-02 DIAGNOSIS — Z681 Body mass index (BMI) 19 or less, adult: Secondary | ICD-10-CM | POA: Diagnosis not present

## 2021-09-02 DIAGNOSIS — R6521 Severe sepsis with septic shock: Secondary | ICD-10-CM | POA: Diagnosis present

## 2021-09-02 DIAGNOSIS — A419 Sepsis, unspecified organism: Secondary | ICD-10-CM | POA: Diagnosis present

## 2021-09-02 DIAGNOSIS — J449 Chronic obstructive pulmonary disease, unspecified: Secondary | ICD-10-CM | POA: Diagnosis present

## 2021-09-02 DIAGNOSIS — J189 Pneumonia, unspecified organism: Secondary | ICD-10-CM | POA: Diagnosis not present

## 2021-09-02 DIAGNOSIS — Z8616 Personal history of COVID-19: Secondary | ICD-10-CM | POA: Diagnosis not present

## 2021-09-02 DIAGNOSIS — I248 Other forms of acute ischemic heart disease: Secondary | ICD-10-CM | POA: Diagnosis present

## 2021-09-02 DIAGNOSIS — J690 Pneumonitis due to inhalation of food and vomit: Secondary | ICD-10-CM | POA: Diagnosis present

## 2021-09-02 DIAGNOSIS — J9611 Chronic respiratory failure with hypoxia: Secondary | ICD-10-CM | POA: Diagnosis not present

## 2021-09-02 DIAGNOSIS — I482 Chronic atrial fibrillation, unspecified: Secondary | ICD-10-CM | POA: Diagnosis present

## 2021-09-02 DIAGNOSIS — K5900 Constipation, unspecified: Secondary | ICD-10-CM | POA: Diagnosis present

## 2021-09-02 DIAGNOSIS — I480 Paroxysmal atrial fibrillation: Secondary | ICD-10-CM | POA: Diagnosis present

## 2021-09-02 DIAGNOSIS — Z23 Encounter for immunization: Secondary | ICD-10-CM | POA: Diagnosis present

## 2021-09-02 DIAGNOSIS — Z515 Encounter for palliative care: Secondary | ICD-10-CM | POA: Diagnosis not present

## 2021-09-02 DIAGNOSIS — E43 Unspecified severe protein-calorie malnutrition: Secondary | ICD-10-CM | POA: Diagnosis present

## 2021-09-02 LAB — BASIC METABOLIC PANEL
Anion gap: 7 (ref 5–15)
BUN: 15 mg/dL (ref 8–23)
CO2: 26 mmol/L (ref 22–32)
Calcium: 7.8 mg/dL — ABNORMAL LOW (ref 8.9–10.3)
Chloride: 96 mmol/L — ABNORMAL LOW (ref 98–111)
Creatinine, Ser: 0.6 mg/dL (ref 0.44–1.00)
GFR, Estimated: >60 mL/min (ref >60)
Glucose, Bld: 88 mg/dL (ref 70–99)
Potassium: 2.9 mmol/L — ABNORMAL LOW (ref 3.5–5.1)
Sodium: 129 mmol/L — ABNORMAL LOW (ref 135–145)

## 2021-09-02 LAB — CBC
HCT: 32.6 % — ABNORMAL LOW (ref 36.0–46.0)
Hemoglobin: 10.4 g/dL — ABNORMAL LOW (ref 12.0–15.0)
MCH: 28.8 pg (ref 26.0–34.0)
MCHC: 31.9 g/dL (ref 30.0–36.0)
MCV: 90.3 fL (ref 80.0–100.0)
Platelets: 514 10*3/uL — ABNORMAL HIGH (ref 150–400)
RBC: 3.61 MIL/uL — ABNORMAL LOW (ref 3.87–5.11)
RDW: 14.1 % (ref 11.5–15.5)
WBC: 13.3 10*3/uL — ABNORMAL HIGH (ref 4.0–10.5)
nRBC: 0 % (ref 0.0–0.2)

## 2021-09-02 LAB — URINE CULTURE

## 2021-09-02 LAB — STREP PNEUMONIAE URINARY ANTIGEN: Strep Pneumo Urinary Antigen: NEGATIVE

## 2021-09-02 MED ORDER — CHLORHEXIDINE GLUCONATE CLOTH 2 % EX PADS
6.0000 | MEDICATED_PAD | Freq: Every day | CUTANEOUS | Status: DC
  Administered 2021-09-02 – 2021-09-11 (×9): 6 via TOPICAL

## 2021-09-02 MED ORDER — SODIUM CHLORIDE 0.9 % IV SOLN
1.5000 g | Freq: Four times a day (QID) | INTRAVENOUS | Status: DC
  Administered 2021-09-02 – 2021-09-04 (×7): 1.5 g via INTRAVENOUS
  Filled 2021-09-02 (×2): qty 4
  Filled 2021-09-02: qty 1.5
  Filled 2021-09-02 (×6): qty 4

## 2021-09-02 MED ORDER — ORAL CARE MOUTH RINSE
15.0000 mL | Freq: Two times a day (BID) | OROMUCOSAL | Status: DC
  Administered 2021-09-02 – 2021-09-17 (×25): 15 mL via OROMUCOSAL

## 2021-09-02 MED ORDER — OXYCODONE-ACETAMINOPHEN 5-325 MG PO TABS
1.0000 | ORAL_TABLET | Freq: Once | ORAL | Status: AC
  Administered 2021-09-02: 1 via ORAL
  Filled 2021-09-02: qty 1

## 2021-09-02 MED ORDER — ALBUTEROL SULFATE HFA 108 (90 BASE) MCG/ACT IN AERS: 2.0000 | INHALATION_SPRAY | RESPIRATORY_TRACT | Status: DC | PRN

## 2021-09-02 MED ORDER — OXYCODONE HCL 5 MG PO TABS
5.0000 mg | ORAL_TABLET | Freq: Three times a day (TID) | ORAL | Status: DC | PRN
Start: 2021-09-02 — End: 2021-09-07
  Administered 2021-09-02 – 2021-09-07 (×13): 5 mg via ORAL
  Filled 2021-09-02 (×13): qty 1

## 2021-09-02 MED ORDER — OXYCODONE-ACETAMINOPHEN 10-325 MG PO TABS
1.0000 | ORAL_TABLET | Freq: Three times a day (TID) | ORAL | Status: DC | PRN
Start: 2021-09-02 — End: 2021-09-02

## 2021-09-02 MED ORDER — OXYCODONE-ACETAMINOPHEN 5-325 MG PO TABS
1.0000 | ORAL_TABLET | Freq: Three times a day (TID) | ORAL | Status: DC | PRN
Start: 2021-09-02 — End: 2021-09-07
  Administered 2021-09-03 – 2021-09-07 (×11): 1 via ORAL
  Filled 2021-09-02 (×11): qty 1

## 2021-09-02 NOTE — ED Notes (Signed)
Pt given specimen cup and made aware we need sputum sample. Pt also aware we need stool sample.
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
## 2021-09-02 NOTE — ED Notes (Addendum)
Patient refused meal tray. States its not what she wants. New tray order for patient. Once new tray is here will give Xarelto.

## 2021-09-02 NOTE — ED Notes (Addendum)
Pt BP low. Checked both arms using adult small BP cuff with no change. Placed pediatric cuff on patient with better readings. Pt is very petite. Kristen Gully, MD messaged and paged. Pt denies symptoms and is resting comfortably.   Per MD, "As long as she's asymptomatic we can just continue maintenance fluids/PO intake. Let me know if anything changes."

## 2021-09-02 NOTE — Progress Notes (Signed)
PHARMACY NOTE:  ANTIMICROBIAL RENAL DOSAGE ADJUSTMENT  Current antimicrobial regimen includes a mismatch between antimicrobial dosage and estimated renal function.  As per policy approved by the Pharmacy & Therapeutics and Medical Executive Committees, the antimicrobial dosage will be adjusted accordingly.  Current antimicrobial dosage:  Unasyn 3g IV q8  Indication: aspiration pneumonia  Renal Function:  Estimated Creatinine Clearance: 39.6 mL/min (by C-G formula based on SCr of 0.6 mg/dL). _0      On intermittent HD, scheduled: _1      On CRRT    Antimicrobial dosage has been changed to:  Unasyn 1.5g IV q6  Additional comments:   Thank you for allowing pharmacy to be a part of this patient's care.  Kara Mead, [facility] 09/02/2021 10:05 AM

## 2021-09-02 NOTE — Progress Notes (Signed)
PROGRESS NOTE    Kristen [name]  LPF:[id number] DOB: 03-31-1946 DOA: 09/01/2021 PCP: [name], MD   Brief Narrative:  Kristen [name] is a 75 y.o. female with medical history significant of COPD with chronic hypoxic respiratory failure on 3 L/min oxygen at baseline, chronic A-fib, venous insufficiency, hx of transient hypotension, hospital admission in July of this year for aspiration pneumonia and acute on chronic hypoxic respiratory failure.  She was brought to [facility] ED from home by EMS due to ongoing diarrhea x 5 days, productive cough with associated right-sided chest pain and chills, with progressive generalized weakness.  Denies abdominal pain, nausea, vomiting, dysuria, focal weakness numbness or tingling, headaches or other recent symptoms.  Reports increasing her oxygen briefly to 3.5 L/min mostly due to feeling anxious.  Otherwise has been on her usual 3 L/min. Chest xray showed new extensive consolidation in the lateral right middle and lower lung, consistent with PNA, small right pleural effusion and chronic hyperexpansion compatible with emphysema. Patient is followed by the IM residency service at [facility], but this states she would prefer to be admitted here to [facility] this time.  Assessment & Plan:  Sepsis secondary to likely aspiration pneumonia, POA -History of aspiration pneumonia, most recent admission July 2022 -Sepsis criteria met with leukocytosis tachypnea and pneumonia -Continue Unasyn x5 days, consider 7-day course if not improving clinically -Speech evaluation pending   Chronic hypoxic respiratory failure in the setting of COPD, not in acute exacerbation  -3 L nasal cannula at baseline around-the-clock -Continue 3 L nasal cannula, oxygen walk screening -Without acute wheeze/respiratory symptoms --Continue home regimen with Incruse, Dulera, DuoNebs and as needed albuterol nebs and HFA   Goals of care  Palliative care was to follow up with patient after  recent discharge, continue partial code as below, DO NOT INTUBATE.   Diarrhea, resolved  - Patient reported 5 days of diarrhea at home, now resolved - No BM since admission   Paroxysmal A. Fib  -Continue amiodarone, Xarelto  Prolonged QTC  - 560 on admission, chronically on amiodarone.   - Avoid QT prolonging medications   Chronic venous stasis of bilateral lower extremities  -Wound care consulted, appreciate insight and recommendations    Chronic hyponatremia  - Baseline sodium 128-130   DVT prophylaxis: Rivaroxaban (XARELTO) tablet 15 mg  Code Status: DO NOT INTUBATE (Bipap ok) Family Communication: None available  Status is: Inpatient  Dispo: The patient is from: Home              Anticipated d/c is to: Home              Anticipated d/c date is: 48 to 72 hours              Patient currently not medically stable for discharge  Consultants:  None  Procedures:  None  Antimicrobials:  Unasyn x5 days  Subjective: No acute issues or events overnight, patient states she feels "about the same" as she did at intake, respiratory status minimally improving but otherwise continues to feel fevers chills general malaise.  Otherwise denies nausea vomiting diarrhea constipation headache or chest pain.  Objective: Vitals:   09/01/21 2358 09/02/21 0200 09/02/21 0515 09/02/21 0700  BP:  (!) 98/52 (!) 108/59 (!) 106/52  Pulse:  67 67 68  Resp:  16 13 (!) 24  Temp:      TempSrc:      SpO2: 100% 97% 97% 91%    Intake/Output Summary (Last 24 hours) at 09/02/2021  [place] filed at 09/01/2021 2354 Gross per 24 hour  Intake 667.16 ml  Output --  Net 667.16 ml   There were no vitals filed for this visit.  Examination:  General: Pleasantly resting in bed, No acute distress. HEENT: Normocephalic atraumatic. Sclerae nonicteric, noninjected. Extraocular movements intact bilaterally. Neck: Without mass or deformity. Trachea is midline. Lungs: Clear to auscultate  bilaterally without overt rhonchi, wheeze, or rales. Heart: Regular rate and rhythm. Without murmurs, rubs, or gallops. Abdomen: Soft, nontender, nondistended.  Without guarding or rebound. Extremities: Without cyanosis, clubbing, edema, or obvious deformity. Lower extremity bandage clean dry intact Vascular: Dorsalis pedis and posterior tibial pulses palpable bilaterally. Skin: Warm and dry, no erythema, no ulcerations.  Data Reviewed: I have personally reviewed following labs and imaging studies  CBC: Recent Labs  Lab 09/01/21 1415 09/02/21 0500  WBC 17.5* 13.3*  NEUTROABS 14.9*  --   HGB 11.1* 10.4*  HCT 35.4* 32.6*  MCV 91.2 90.3  PLT 501* 588*   Basic Metabolic Panel: Recent Labs  Lab 09/01/21 1415  NA 131*  K 4.1  CL 95*  CO2 30  GLUCOSE 92  BUN 20  CREATININE 0.83  CALCIUM 7.9*   GFR: CrCl cannot be calculated (Unknown ideal weight.). Liver Function Tests: Recent Labs  Lab 09/01/21 1415  AST 22  ALT 10  ALKPHOS 85  BILITOT 1.2  PROT 6.1*  ALBUMIN 2.3*   No results for input(s): LIPASE, AMYLASE in the last 168 hours. No results for input(s): AMMONIA in the last 168 hours. Coagulation Profile: Recent Labs  Lab 09/01/21 1415  INR 1.9*   Cardiac Enzymes: No results for input(s): CKTOTAL, CKMB, CKMBINDEX, TROPONINI in the last 168 hours. BNP (last 3 results) No results for input(s): PROBNP in the last 8760 hours. HbA1C: No results for input(s): HGBA1C in the last 72 hours. CBG: No results for input(s): GLUCAP in the last 168 hours. Lipid Profile: No results for input(s): CHOL, HDL, LDLCALC, TRIG, CHOLHDL, LDLDIRECT in the last 72 hours. Thyroid Function Tests: No results for input(s): TSH, T4TOTAL, FREET4, T3FREE, THYROIDAB in the last 72 hours. Anemia Panel: No results for input(s): VITAMINB12, FOLATE, FERRITIN, TIBC, IRON, RETICCTPCT in the last 72 hours. Sepsis Labs: Recent Labs  Lab 09/01/21 1402 09/01/21 1601  LATICACIDVEN 1.9 0.9     Recent Results (from the past 240 hour(s))  Resp Panel by RT-PCR (Flu A&B, Covid)     Status: None   Collection Time: 09/01/21  2:20 PM   Specimen: Nasopharyngeal(NP) swabs in vial transport medium  Result Value Ref Range Status   SARS Coronavirus 2 by RT PCR NEGATIVE NEGATIVE Final    Comment: (NOTE) SARS-CoV-2 target nucleic acids are NOT DETECTED.  The SARS-CoV-2 RNA is generally detectable in upper respiratory specimens during the acute phase of infection. The lowest concentration of SARS-CoV-2 viral copies this assay can detect is 138 copies/mL. A negative result does not preclude SARS-Cov-2 infection and should not be used as the sole basis for treatment or other patient management decisions. A negative result may occur with  improper specimen collection/handling, submission of specimen other than nasopharyngeal swab, presence of viral mutation(s) within the areas targeted by this assay, and inadequate number of viral copies(<138 copies/mL). A negative result must be combined with clinical observations, patient history, and epidemiological information. The expected result is Negative.  Fact Sheet for Patients:  EntrepreneurPulse.com.au  Fact Sheet for Healthcare Providers:  IncredibleEmployment.be  This test is no t yet approved or cleared  by the [place] and  has been authorized for detection and/or diagnosis of SARS-CoV-2 by FDA under an Emergency Use Authorization (EUA). This EUA will remain  in effect (meaning this test can be used) for the duration of the COVID-19 declaration under Section 564(b)(1) of the Act, 21 U.S.C.section 360bbb-3(b)(1), unless the authorization is terminated  or revoked sooner.       Influenza A by PCR NEGATIVE NEGATIVE Final   Influenza B by PCR NEGATIVE NEGATIVE Final    Comment: (NOTE) The Xpert Xpress SARS-CoV-2/FLU/RSV plus assay is intended as an aid in the diagnosis of influenza from  Nasopharyngeal swab specimens and should not be used as a sole basis for treatment. Nasal washings and aspirates are unacceptable for Xpert Xpress SARS-CoV-2/FLU/RSV testing.  Fact Sheet for Patients: EntrepreneurPulse.com.au  Fact Sheet for Healthcare Providers: IncredibleEmployment.be  This test is not yet approved or cleared by the [place] FDA and has been authorized for detection and/or diagnosis of SARS-CoV-2 by FDA under an Emergency Use Authorization (EUA). This EUA will remain in effect (meaning this test can be used) for the duration of the COVID-19 declaration under Section 564(b)(1) of the Act, 21 U.S.C. section 360bbb-3(b)(1), unless the authorization is terminated or revoked.  Performed at [facility], [place] [address], [place], [place] [address]     Radiology Studies: [facility] Chest Port 1 View  Result Date: 09/01/2021 CLINICAL DATA:  Questionable sepsis - evaluate for abnormality EXAM: PORTABLE CHEST 1 VIEW COMPARISON:  05/28/2021. FINDINGS: New extensive consolidation in the lateral right midlung and lung base. Increased conspicuity of right greater than left interstitial opacities. Chronic hyperexpansion, compatible with emphysema. Small right pleural effusion. No visible pneumothorax. Cardiomediastinal silhouette is within normal limits and similar to prior. IMPRESSION: 1. New extensive consolidation in the lateral right midlung and lung base, concerning for pneumonia. Followup PA and lateral chest X-ray is recommended in 3-4 weeks following trial of antibiotic therapy to ensure resolution and exclude underlying malignancy. 2. Increased conspicuity of right greater than left interstitial opacities, which probably represents infection given above findings. Asymmetric edema is a consideration. 3. Small right pleural effusion. 4. Chronic hyperexpansion, compatible with emphysema. Electronically Signed   By: Margaretha Sheffield M.D.   On: 09/01/2021 14:18    Scheduled Meds:  amiodarone  200 mg Oral Daily   busPIRone  10 mg Oral Daily   mometasone-formoterol  1 puff Inhalation BID   multivitamin with minerals  1 tablet Oral Daily   Rivaroxaban  15 mg Oral Q breakfast   umeclidinium bromide  1 puff Inhalation Daily   Continuous Infusions:  ampicillin-sulbactam (UNASYN) IV Stopped (09/01/21 2354)   lactated ringers 75 mL/hr at 09/01/21 2331    LOS: 0 days   Time spent: 43mn  Arik Husmann C Gypsy Kellogg, DO [facility]  If 7PM-7AM, please contact night-coverage [email or web address]  09/02/2021, 7:53 AM

## 2021-09-02 NOTE — Consult Note (Signed)
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  Last seen by my associate S. Doty in July 2022.  Followed by [facility] in the community. Wound type:Venous insufficiency with ulceration.  Clinical CEAP 6 Pressure Injury POA: N/A Measurement:To be obtained by bedside RN today and documented on nursing flow sheet (LxWxD in cm) Wound bed: Red, moist Drainage (amount, consistency, odor) small serous Periwound: intact, dry Dressing procedure/placement/frequency: I will provide conservative care orders for these chronic wounds while in house using a daily cleanse with NS, followed by topical care using an antimicrobial nonadherent (xeroform). This will be topped with dry gauze and secured with a Kerlix roll gauze wrap from just below toes to just below knees/paper tape. Heels are to be floated and a sacral bordered foam used to prevent pressure injury in addition to routine turning and repositioning.  [facility] nursing team will not follow, but will remain available to this patient, the nursing and medical teams.  Please re-consult if needed. Thanks, Maudie Flakes, MSN, RN, [place], Arther Abbott  Pager# [phone]

## 2021-09-02 NOTE — ED Notes (Addendum)
Pt called out for 10/10 pain. Will given PRN meds at 0800. Per MAR give 2 meds together. Pt states she takes the same at home. Pt given heating pack and repositioned.

## 2021-09-03 ENCOUNTER — Encounter (HOSPITAL_COMMUNITY): Payer: Self-pay | Admitting: Internal Medicine

## 2021-09-03 DIAGNOSIS — A419 Sepsis, unspecified organism: Secondary | ICD-10-CM | POA: Diagnosis not present

## 2021-09-03 DIAGNOSIS — L97902 Non-pressure chronic ulcer of unspecified part of unspecified lower leg with fat layer exposed: Secondary | ICD-10-CM

## 2021-09-03 DIAGNOSIS — J690 Pneumonitis due to inhalation of food and vomit: Secondary | ICD-10-CM | POA: Diagnosis not present

## 2021-09-03 DIAGNOSIS — E440 Moderate protein-calorie malnutrition: Secondary | ICD-10-CM

## 2021-09-03 DIAGNOSIS — I480 Paroxysmal atrial fibrillation: Secondary | ICD-10-CM | POA: Diagnosis not present

## 2021-09-03 DIAGNOSIS — E43 Unspecified severe protein-calorie malnutrition: Secondary | ICD-10-CM | POA: Insufficient documentation

## 2021-09-03 DIAGNOSIS — I959 Hypotension, unspecified: Secondary | ICD-10-CM

## 2021-09-03 DIAGNOSIS — J189 Pneumonia, unspecified organism: Secondary | ICD-10-CM | POA: Diagnosis not present

## 2021-09-03 LAB — CBC
HCT: 31 % — ABNORMAL LOW (ref 36.0–46.0)
Hemoglobin: 10.2 g/dL — ABNORMAL LOW (ref 12.0–15.0)
MCH: 28.9 pg (ref 26.0–34.0)
MCHC: 32.9 g/dL (ref 30.0–36.0)
MCV: 87.8 fL (ref 80.0–100.0)
Platelets: 567 10*3/uL — ABNORMAL HIGH (ref 150–400)
RBC: 3.53 MIL/uL — ABNORMAL LOW (ref 3.87–5.11)
RDW: 14.1 % (ref 11.5–15.5)
WBC: 20.2 10*3/uL — ABNORMAL HIGH (ref 4.0–10.5)
nRBC: 0 % (ref 0.0–0.2)

## 2021-09-03 LAB — LEGIONELLA PNEUMOPHILA SEROGP 1 UR AG: L. pneumophila Serogp 1 Ur Ag: NEGATIVE

## 2021-09-03 LAB — BASIC METABOLIC PANEL
Anion gap: 8 (ref 5–15)
BUN: 12 mg/dL (ref 8–23)
CO2: 24 mmol/L (ref 22–32)
Calcium: 7.8 mg/dL — ABNORMAL LOW (ref 8.9–10.3)
Chloride: 98 mmol/L (ref 98–111)
Creatinine, Ser: 0.65 mg/dL (ref 0.44–1.00)
GFR, Estimated: >60 mL/min (ref >60)
Glucose, Bld: 95 mg/dL (ref 70–99)
Potassium: 3.8 mmol/L (ref 3.5–5.1)
Sodium: 130 mmol/L — ABNORMAL LOW (ref 135–145)

## 2021-09-03 LAB — MAGNESIUM: Magnesium: 1.1 mg/dL — ABNORMAL LOW (ref 1.7–2.4)

## 2021-09-03 LAB — MRSA NEXT GEN BY PCR, NASAL: MRSA by PCR Next Gen: NOT DETECTED

## 2021-09-03 MED ORDER — ALBUMIN HUMAN 25 % IV SOLN
25.0000 g | Freq: Four times a day (QID) | INTRAVENOUS | Status: AC
  Administered 2021-09-03 – 2021-09-04 (×4): 25 g via INTRAVENOUS
  Filled 2021-09-03 (×2): qty 100

## 2021-09-03 MED ORDER — MAGNESIUM SULFATE 4 GM/100ML IV SOLN
4.0000 g | Freq: Once | INTRAVENOUS | Status: AC
  Administered 2021-09-03: 4 g via INTRAVENOUS
  Filled 2021-09-03: qty 100

## 2021-09-03 MED ORDER — LORATADINE 10 MG PO TABS
10.0000 mg | ORAL_TABLET | Freq: Every day | ORAL | Status: DC
  Administered 2021-09-03 – 2021-09-17 (×14): 10 mg via ORAL
  Filled 2021-09-03 (×15): qty 1

## 2021-09-03 MED ORDER — POTASSIUM CHLORIDE 10 MEQ/100ML IV SOLN
10.0000 meq | INTRAVENOUS | Status: AC
  Administered 2021-09-03 (×2): 10 meq via INTRAVENOUS
  Filled 2021-09-03 (×2): qty 100

## 2021-09-03 MED ORDER — PANTOPRAZOLE SODIUM 40 MG PO TBEC
40.0000 mg | DELAYED_RELEASE_TABLET | Freq: Every day | ORAL | Status: DC
  Administered 2021-09-03 – 2021-09-17 (×15): 40 mg via ORAL
  Filled 2021-09-03 (×15): qty 1

## 2021-09-03 MED ORDER — IPRATROPIUM BROMIDE 0.02 % IN SOLN: 0.5000 mg | RESPIRATORY_TRACT | Status: DC | PRN

## 2021-09-03 MED ORDER — IPRATROPIUM BROMIDE 0.02 % IN SOLN
0.5000 mg | Freq: Four times a day (QID) | RESPIRATORY_TRACT | Status: DC
  Administered 2021-09-03 – 2021-09-05 (×11): 0.5 mg via RESPIRATORY_TRACT
  Filled 2021-09-03 (×11): qty 2.5

## 2021-09-03 MED ORDER — PROSOURCE PLUS PO LIQD
30.0000 mL | Freq: Two times a day (BID) | ORAL | Status: DC
  Administered 2021-09-03 – 2021-09-15 (×9): 30 mL via ORAL
  Filled 2021-09-03 (×16): qty 30

## 2021-09-03 MED ORDER — ENSURE ENLIVE PO LIQD
237.0000 mL | Freq: Two times a day (BID) | ORAL | Status: DC
  Administered 2021-09-05 – 2021-09-17 (×20): 237 mL via ORAL

## 2021-09-03 MED ORDER — LEVALBUTEROL HCL 0.63 MG/3ML IN NEBU
0.6300 mg | INHALATION_SOLUTION | Freq: Four times a day (QID) | RESPIRATORY_TRACT | Status: DC
  Administered 2021-09-03 – 2021-09-05 (×11): 0.63 mg via RESPIRATORY_TRACT
  Filled 2021-09-03 (×10): qty 3

## 2021-09-03 MED ORDER — LEVALBUTEROL HCL 0.63 MG/3ML IN NEBU: 0.6300 mg | INHALATION_SOLUTION | RESPIRATORY_TRACT | Status: DC | PRN

## 2021-09-03 NOTE — Progress Notes (Signed)
PROGRESS NOTE    Kristen [name]  WAQ:[id number] DOB: [birth year] DOA: 09/01/2021 PCP: [name], MD   No chief complaint on file.   Brief Narrative:  Kristen [name] is a 75 y.o. female with medical history significant of COPD with chronic hypoxic respiratory failure on 3 L/min oxygen at baseline, chronic A-fib, venous insufficiency, hx of transient hypotension, hospital admission in July of this year for aspiration pneumonia and acute on chronic hypoxic respiratory failure.  She was brought to [facility] ED from home by EMS due to ongoing diarrhea x 5 days, productive cough with associated right-sided chest pain and chills, with progressive generalized weakness.  Denies abdominal pain, nausea, vomiting, dysuria, focal weakness numbness or tingling, headaches or other recent symptoms.  Reports increasing her oxygen briefly to 3.5 L/min mostly due to feeling anxious.  Otherwise has been on her usual 3 L/min. Chest xray showed new extensive consolidation in the lateral right middle and lower lung, consistent with PNA, small right pleural effusion and chronic hyperexpansion compatible with emphysema. Patient is followed by the IM residency service at [facility], but this states she would prefer to be admitted here to [facility] this time.   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 -On admission  patient noted to have met criteria for sepsis with leukocytosis, tachypnea, pneumonia. -Patient with a rising leukocytosis however remains afebrile and feels she is clinically improving however still with increased O2 requirements on 8 L high flow nasal cannula. -Blood cultures pending with no growth to date x2 days, MRSA PCR negative.  Urine cultures with multiple species. -We will continue empiric IV Unasyn and if patient develops a fever or leukocytosis worsens may need to broaden antibiotic coverage. -Supportive care.  2.  Hypotension -Patient with systolic blood pressures in the high 80s this morning however denies any lightheadedness or dizziness. -Patient with low albumin levels. -Currently on IV fluids. -We will place on IV albumin every 6 hours x24 hours. -Follow.  3.  Acute on chronic hypoxic respiratory failure in the setting of COPD -Likely secondary to pneumonia/aspiration pneumonia. -Patient on 8 L high flow nasal cannula with sats in the mid 90s.  Patient on admission noted to be tachypneic and chest x-ray consistent with a pneumonia. -Patient on the baseline home oxygen of 3 L nasal cannula. -We will place on scheduled Xopenex and Atrovent nebs, continue Dulera, discontinue Incruse, placed on as needed Xopenex and Atrovent nebs. -Placed on Claritin, PPI.  4.  Diarrhea -Patient had reported 5 days of diarrhea at home prior to admission. -No bowel movement since admission. -We will discontinue contact precautions.  5.  Paroxysmal atrial fibrillation -Continue amiodarone and Xarelto.  6.  Prolonged QTC -Patient noted to have a QTC of 560 on admission, chronically on amiodarone. -Repeat EKG this morning with resolution of QT prolongation currently at 437. -Keep potassium approximately at 4, magnesium approximately at 2.  #7.  Chronic hyponatremia -Currently at baseline.  8.  Chronic venous stasis of bilateral lower extremities/venous insufficiency with  ulceration -Continue current wound treatment recommendations per [place] RN  9.  Hypomagnesemia -Magnesium sulfate 4 g IV x1. -Repeat magnesium in the a.m.  9.  Moderate protein calorie malnutrition -Patient cachectic and frail on examination. -Albumin level at 2.3 on admission. -Will liberalize diet to a regular diet. -IV albumin every 6 hours x1 day. -Place on Ensure nutritional supplementation. -Consult with RD.  10.  Goals of care -It is noted that palliative care was to follow-up with patient after recent discharge, continue partial code as noted below, patient is a DO NOT INTUBATE. -We will need palliative care to follow-up in the outpatient setting on discharge.         DVT prophylaxis: Xarelto Code Status: Partial code Family Communication: Updated patient.  No family at bedside. Disposition:   Status is: Inpatient  Remains inpatient appropriate because:Inpatient level of care appropriate due to severity of illness  Dispo: The patient is from: Home              Anticipated d/c is to:  TBD              Patient currently is not medically stable to d/c.   Difficult to place patient No       Consultants:  [place], RN 09/02/2021  Procedures:  Chest x-ray 09/01/2021   Antimicrobials: IV Unasyn 09/01/2021>>>>> IV azithromycin 10/10 /2022x1 dose IV Rocephin 10/10/ 2022x1 dose   Subjective: States she is feeling a little bit better.  Denies any further diarrhea.  Still with some complaints of right-sided pleuritic chest pain to the point she feels she may have broken a rib.  States cough is clearing up.  Denies any lightheadedness or dizziness however just laying in the bed.  Feels appetite is improving.  Currently on 8 L high flow nasal cannula with sats of 97%.  Objective: Vitals:   09/03/21 0814 09/03/21 0900 09/03/21 1000 09/03/21 1052  BP:  (!) 103/45 (!) 87/33   Pulse:  74 89 84  Resp:  (!) 31 (!) 21 18  Temp:      TempSrc:      SpO2: 96%  90% 91% 94%  Weight:      Height:        Intake/Output Summary (Last 24 hours) at 09/03/2021 1113 Last data filed at 09/03/2021 1000 Gross per 24 hour  Intake 1594.67 ml  Output 150 ml  Net 1444.67 ml   Filed Weights   09/02/21 1000 09/02/21 1900  Weight: 41.9 kg 41.9 kg    Examination:  General exam: Frail.  Cachectic. Respiratory system: Some coarse breath sounds on the right.  No wheezing.  Fair air movement.  No use of accessory muscles of respiration.  Cardiovascular system: S1 & S2 heard, RRR. No JVD, murmurs, rubs, gallops or clicks. No pedal edema. Gastrointestinal system: Abdomen is nondistended, soft and nontender. No organomegaly or masses felt. Normal bowel sounds heard. Central nervous system: Alert and oriented. No focal neurological deficits. Extremities: Symmetric 5 x 5 power. Skin: No rashes, lesions or ulcers Psychiatry: Judgement and insight appear normal. Mood & affect appropriate.     Data Reviewed: I have personally reviewed following labs and imaging studies  CBC: Recent Labs  Lab 09/01/21 1415 09/02/21 0500 09/03/21 0545  WBC 17.5* 13.3* 20.2*  NEUTROABS 14.9*  --   --   HGB 11.1* 10.4* 10.2*  HCT 35.4* 32.6*  31.0*  MCV 91.2 90.3 87.8  PLT 501* 514* 567*    Basic Metabolic Panel: Recent Labs  Lab 09/01/21 1415 09/02/21 0500 09/03/21 0545  NA 131* 129* 130*  K 4.1 2.9* 3.8  CL 95* 96* 98  CO2 _0 GLUCOSE 92 88 95  BUN _1 CREATININE 0.83 0.60 0.65  CALCIUM 7.9* 7.8* 7.8*  MG  --   --  1.1*    GFR: Estimated Creatinine Clearance: 39.6 mL/min (by C-G formula based on SCr of 0.65 mg/dL).  Liver Function Tests: Recent Labs  Lab 09/01/21 1415  AST 22  ALT 10  ALKPHOS 85  BILITOT 1.2  PROT 6.1*  ALBUMIN 2.3*    CBG: No results for input(s): GLUCAP in the last 168 hours.   Recent Results (from the past 240 hour(s))  Blood culture (routine single)     Status: None (Preliminary result)   Collection Time:  09/01/21  2:15 PM   Specimen: BLOOD  Result Value Ref Range Status   Specimen Description   Final    BLOOD LEFT ANTECUBITAL Performed at [facility] [address] [place], [place] [address]    Special Requests   Final    BOTTLES DRAWN AEROBIC AND ANAEROBIC Blood Culture adequate volume Performed at [place] [address], [place], [place] [address]    Culture   Final    NO GROWTH 2 DAYS Performed at [place] [address], [place], [place] [address]    Report Status PENDING  Incomplete  Resp Panel by RT-PCR (Flu A&B, Covid)     Status: None   Collection Time: 09/01/21  2:20 PM   Specimen: Nasopharyngeal(NP) swabs in vial transport medium  Result Value Ref Range Status   SARS Coronavirus 2 by RT PCR NEGATIVE NEGATIVE Final    Comment: (NOTE) SARS-CoV-2 target nucleic acids are NOT DETECTED.  The SARS-CoV-2 RNA is generally detectable in upper respiratory specimens during the acute phase of infection. The lowest concentration of SARS-CoV-2 viral copies this assay can detect is 138 copies/mL. A negative result does not preclude SARS-Cov-2 infection and should not be used as the sole basis for treatment or other patient management decisions. A negative result may occur with  improper specimen collection/handling, submission of specimen other than nasopharyngeal swab, presence of viral mutation(s) within the areas targeted by this assay, and inadequate number of viral copies(<138 copies/mL). A negative result must be combined with clinical observations, patient history, and epidemiological information. The expected result is Negative.  Fact Sheet for Patients:  EntrepreneurPulse.com.au  Fact Sheet for Healthcare Providers:  IncredibleEmployment.be  This test is no t yet approved or cleared by the [place] FDA and  has been authorized for detection and/or diagnosis of SARS-CoV-2  by FDA under an Emergency Use Authorization (EUA). This EUA will remain  in effect (meaning this test can be used) for the duration of the COVID-19 declaration under Section 564(b)(1) of the Act, 21 U.S.C.section 360bbb-3(b)(1), unless the authorization is terminated  or revoked sooner.       Influenza A by PCR NEGATIVE NEGATIVE Final   Influenza B by PCR NEGATIVE NEGATIVE Final    Comment: (NOTE) The Xpert Xpress SARS-CoV-2/FLU/RSV plus assay is intended as an aid in the diagnosis of influenza from Nasopharyngeal swab specimens and should not be used as a sole basis for treatment. Nasal washings and aspirates are unacceptable for Xpert Xpress SARS-CoV-2/FLU/RSV testing.  Fact Sheet  for Patients: EntrepreneurPulse.com.au  Fact Sheet for Healthcare Providers: IncredibleEmployment.be  This test is not yet approved or cleared by the [place] FDA and has been authorized for detection and/or diagnosis of SARS-CoV-2 by FDA under an Emergency Use Authorization (EUA). This EUA will remain in effect (meaning this test can be used) for the duration of the COVID-19 declaration under Section 564(b)(1) of the Act, 21 U.S.C. section 360bbb-3(b)(1), unless the authorization is terminated or revoked.  Performed at [facility], [place] [address], [place], [place] [address]   Urine Culture     Status: Abnormal   Collection Time: 09/01/21  8:00 PM   Specimen: In/Out Cath Urine  Result Value Ref Range Status   Specimen Description   Final    IN/OUT CATH URINE Performed at [place] [address] [place], [place] [address]    Special Requests   Final    NONE Performed at [facility], [place] [address], [place], [place] [address]    Culture MULTIPLE SPECIES PRESENT, SUGGEST RECOLLECTION (A)  Final   Report Status 09/02/2021 FINAL  Final  MRSA Next Gen by PCR, Nasal     Status: None    Collection Time: 09/02/21 11:34 PM   Specimen: Nasal Mucosa; Nasal Swab  Result Value Ref Range Status   MRSA by PCR Next Gen NOT DETECTED NOT DETECTED Final    Comment: (NOTE) The GeneXpert MRSA Assay (FDA approved for NASAL specimens only), is one component of a comprehensive MRSA colonization surveillance program. It is not intended to diagnose MRSA infection nor to guide or monitor treatment for MRSA infections. Test performance is not FDA approved in patients less than 64 years old. Performed at [facility], [place] [address] [place], [place] [address]          Radiology Studies: [facility] Chest Port 1 View  Result Date: 09/01/2021 CLINICAL DATA:  Questionable sepsis - evaluate for abnormality EXAM: PORTABLE CHEST 1 VIEW COMPARISON:  05/28/2021. FINDINGS: New extensive consolidation in the lateral right midlung and lung base. Increased conspicuity of right greater than left interstitial opacities. Chronic hyperexpansion, compatible with emphysema. Small right pleural effusion. No visible pneumothorax. Cardiomediastinal silhouette is within normal limits and similar to prior. IMPRESSION: 1. New extensive consolidation in the lateral right midlung and lung base, concerning for pneumonia. Followup PA and lateral chest X-ray is recommended in 3-4 weeks following trial of antibiotic therapy to ensure resolution and exclude underlying malignancy. 2. Increased conspicuity of right greater than left interstitial opacities, which probably represents infection given above findings. Asymmetric edema is a consideration. 3. Small right pleural effusion. 4. Chronic hyperexpansion, compatible with emphysema. Electronically Signed   By: Margaretha Sheffield M.D.   On: 09/01/2021 14:18        Scheduled Meds:  amiodarone  200 mg Oral Daily   busPIRone  10 mg Oral Daily   Chlorhexidine Gluconate Cloth  6 each Topical Daily   ipratropium  0.5 mg Nebulization Q6H   levalbuterol  0.63 mg  Nebulization Q6H   loratadine  10 mg Oral Daily   mouth rinse  15 mL Mouth Rinse BID   mometasone-formoterol  1 puff Inhalation BID   multivitamin with minerals  1 tablet Oral Daily   pantoprazole  40 mg Oral Q0600   Rivaroxaban  15 mg Oral Q breakfast   Continuous Infusions:  albumin human     ampicillin-sulbactam (UNASYN) IV 1.5 g (09/03/21 1112)   magnesium sulfate bolus  IVPB       LOS: 1 day    Time spent: 40 minutes    Irine Seal, MD [facility]   To contact the attending provider between 7A-7P or the covering provider during after hours 7P-7A, please log into the web site [email or web address] and access using universal [place] password for that web site. If you do not have the password, please call the hospital operator.  09/03/2021, 11:13 AM

## 2021-09-03 NOTE — Progress Notes (Signed)
Initial Nutrition Assessment  DOCUMENTATION CODES:   Severe malnutrition in context of chronic illness, Underweight  INTERVENTION:  - continue Ensure Plus BID, each supplement provides 350 kcal and 13 grams of protein - will order 30 ml Prosource Plus BID, each supplement provides 100 kcal and 15 grams protein.    NUTRITION DIAGNOSIS:   Severe Malnutrition related to chronic illness (COPD) as evidenced by severe fat depletion, severe muscle depletion.  GOAL:   Patient will meet greater than or equal to 90% of their needs  MONITOR:   PO intake, Supplement acceptance, Labs, Weight trends  REASON FOR ASSESSMENT:   Malnutrition Screening Tool, Consult Assessment of nutrition requirement/status  ASSESSMENT:   75 y.o. female with medical history of COPD with chronic hypoxic respiratory failure on 3 L/min oxygen at baseline, chronic A-fib, venous insufficiency, hx of transient hypotension. She was admitted in 05/2021 for aspiration PNA and acute on chronic hypoxic respiratory failure. She presented to the ED via EMS due to 5 day hx of diarrhea, productive cough, R-sided chest pain, chills, and progressive weakness.  She ate 75% of breakfast which consisted of oatmeal, yogurt, and coffee. She ate 2 cups of cream of chicken soup, 1 chicken tender, and yogurt. She has ordered lunch to arrive a bit later.   Patient has no teeth but has dentures at home that fit well. She reports that she needs to go to a dentist but that every time she has an appointment she ends up being hospitalized. Patient reports that she has "a bad leg" but that she ambulates unassisted without difficulty at home.  She denies any chewing or swallowing difficulties now or PTA and denies any abdominal discomfort or nausea today or PTA.  She reports that appetite fluctuates but that overall she does eat well and has adequate access to food. She drinks premier protein at home and prefers this to other protein shakes. She  sometimes has them as a dessert item after dinner too.   Weight yesterday was 92 lb and it appears that weight has been stable since 12/15/20. She reports significant weight loss over the past 5 years due to many bad things happening in her life. Details not given. Note in [facility] reports states that patient reported 50-60 lb weight loss in the past 2-3 years.   Per notes: - sepsis 2/2 likely aspiration PNA - hypotension - reported diarrhea x5 days PTA but no BM since admission - chronic venous stasis ulcers to BLE - patient is a partial code, is DNI   Labs reviewed; Na: 130 mmol/l, Ca: 7.8 mg/dl, Mg: 1.1 mg/dl.  Medications reviewed; 1 tablet multivitamin with minerals/day, 40 mg oral protonix/day, 10 mEq IV KCl x2 runs 10/12.    NUTRITION - FOCUSED PHYSICAL EXAM:  Flowsheet Row Most Recent Value  Orbital Region Severe depletion  Upper Arm Region Severe depletion  Thoracic and Lumbar Region Severe depletion  Buccal Region Severe depletion  Temple Region Moderate depletion  Clavicle Bone Region Severe depletion  Clavicle and Acromion Bone Region Severe depletion  Scapular Bone Region Unable to assess  Dorsal Hand Severe depletion  Patellar Region Severe depletion  Anterior Thigh Region Severe depletion  Posterior Calf Region Severe depletion  Edema (RD Assessment) Mild  [BLE]  Hair Reviewed  Eyes Reviewed  Mouth Reviewed  Skin Reviewed  Nails Reviewed       Diet Order:   Diet Order             Diet regular Room service  appropriate? Yes; Fluid consistency: Thin  Diet effective now                   EDUCATION NEEDS:   Education needs have been addressed  Skin:  Skin Assessment: Skin Integrity Issues: Skin Integrity Issues:: Other (Comment) Other: venous stasis ulcers to L pretibial area x2  Last BM:  PTA/unknown  Height:   Ht Readings from Last 1 Encounters:  09/02/21 5' 11" (1.803 m)    Weight:   Wt Readings from Last 1 Encounters:  09/02/21 41.9 kg      Estimated Nutritional Needs:  Kcal:  1725-1975 kcal Protein:  85-100 grams Fluid:  >/= 2 L/day      Jarome Matin, MS, RD, LDN, CNSC Inpatient Clinical Dietitian RD pager # available in [facility]  After hours/weekend pager # available in [facility]

## 2021-09-03 NOTE — Evaluation (Signed)
Occupational Therapy Evaluation Patient Details Name: Kristen [name] MRN: [id number] DOB: 02/21/1946 Today's Date: 09/03/2021   History of Present Illness Pt is 75 yo female who presented to [facility] ED from home by EMS due to ongoing diarrhea x 5 days, productive cough with associated right-sided chest pain and chills, with progressive generalized weakness. Medical history significant of COPD with chronic hypoxic respiratory failure on 3 L/min oxygen at baseline, chronic A-fib, venous insufficiency, hx of transient hypotension, hospital admission in July of this year for aspiration pneumonia and acute on chronic hypoxic respiratory failure.   Clinical Impression   Pt admitted with the above diagnoses and presents with below problem list. Pt will benefit from continued acute OT to address the below listed deficits and maximize independence with basic ADLs prior to d/c home with family assistance. At baseline pt is independent to mod I with basic ADLs. Pt currently needs mod A with UB ADLs, max A for LB ADLs in sitting/lateral lean position. Pt able to sit EOB about 10 minutes today; tremulous d/t generalized weakness. Pt fatigued and returned to supine at end of session. While EOB pt SpO2 87-91, HR up to 97 on 5L high flow O2 via [place].        Recommendations for follow up therapy are one component of a multi-disciplinary discharge planning process, led by the attending physician.  Recommendations may be updated based on patient status, additional functional criteria and insurance authorization.   Follow Up Recommendations  Home health OT;Supervision/Assistance - 24 hour    Equipment Recommendations  Hospital bed (TBD pending progress)    Recommendations for Other Services       Precautions / Restrictions Precautions Precautions: Fall Precaution Comments: monitor O2 Restrictions Weight Bearing Restrictions: No      Mobility Bed Mobility Overal bed mobility: Needs Assistance Bed Mobility:  Rolling;Sidelying to Sit Rolling: Mod assist Sidelying to sit: Mod assist       General bed mobility comments: pt able to advance BLE off EOB with extra time and effort. Mod A for trunk elevation.    Transfers                 General transfer comment: NT-declined    Balance Overall balance assessment: Needs assistance Sitting-balance support: Bilateral upper extremity supported;Feet supported Sitting balance-Leahy Scale: Poor Sitting balance - Comments: BUE support for static sitting. tremulous d/t generalized weakness but able to support self to static sit with BUE support                                   ADL either performed or assessed with clinical judgement   ADL Overall ADL's : Needs assistance/impaired Eating/Feeding: Set up   Grooming: Moderate assistance   Upper Body Bathing: Moderate assistance   Lower Body Bathing: Maximal assistance;Sitting/lateral leans   Upper Body Dressing : Moderate assistance;Sitting   Lower Body Dressing: Maximal assistance;Sitting/lateral leans                 General ADL Comments: Pt completed bed mobility and able to sit EOB with BUE support for about 10 minutes; tremulous BUE d/t weakness.     Vision         Perception     Praxis      Pertinent Vitals/Pain Pain Assessment: Faces Faces Pain Scale: Hurts little more Pain Location: Low back and BLE Pain Descriptors / Indicators: Discomfort Pain Intervention(s): Monitored during session;Limited  activity within patient's tolerance;Repositioned     Hand Dominance Right   Extremity/Trunk Assessment Upper Extremity Assessment Upper Extremity Assessment: Generalized weakness   Lower Extremity Assessment Lower Extremity Assessment: Defer to PT evaluation   Cervical / Trunk Assessment Cervical / Trunk Assessment: Kyphotic   Communication Communication Communication: No difficulties   Cognition Arousal/Alertness: Awake/alert Behavior During  Therapy: WFL for tasks assessed/performed Overall Cognitive Status: Within Functional Limits for tasks assessed                                     General Comments  on 5L high flow O2 with O2 88-91 sitting EOB, HR up to 97.    Exercises     Shoulder Instructions      Home Living Family/patient expects to be discharged to:: Private residence Living Arrangements: Other relatives Available Help at Discharge: Family;Available 24 hours/day Type of Home: House Home Access: Level entry     Home Layout: One level     Bathroom Shower/Tub: Occupational psychologist: Standard Bathroom Accessibility: Yes   Home Equipment: Cane - single point;Shower seat;Wheelchair - [employer] - 2 wheels;Walker - 4 wheels          Prior Functioning/Environment Level of Independence: Independent        Comments: 3L at home        OT Problem List: Decreased strength;Decreased activity tolerance;Impaired balance (sitting and/or standing);Decreased knowledge of use of DME or AE;Decreased knowledge of precautions;Cardiopulmonary status limiting activity;Pain      OT Treatment/Interventions: Self-care/ADL training;Energy conservation;DME and/or AE instruction;Therapeutic exercise;Therapeutic activities;Patient/family education;Balance training    OT Goals(Current goals can be found in the care plan section) Acute Rehab OT Goals Patient Stated Goal: to  breathe better, go home OT Goal Formulation: With patient Time For Goal Achievement: 09/17/21 Potential to Achieve Goals: Good ADL Goals Pt Will Perform Grooming: with min assist;sitting Pt Will Perform Upper Body Bathing: with min assist;sitting Pt Will Perform Lower Body Bathing: with mod assist;sit to/from stand;sitting/lateral leans Pt Will Transfer to Toilet: with mod assist;stand pivot transfer Pt Will Perform Toileting - Clothing Manipulation and hygiene: with mod assist;sitting/lateral leans Additional ADL Goal  #1: Pt will complete bed mobility at min guard level to prepare for EOB/OOB ADLs.  OT Frequency: Min 2X/week   Barriers to D/C:    Pt lives with sister, other family lives nearby.       Co-evaluation              AM-PAC OT "6 Clicks" Daily Activity     Outcome Measure Help from another person eating meals?: A Little Help from another person taking care of personal grooming?: A Lot Help from another person toileting, which includes using toliet, bedpan, or urinal?: Total Help from another person bathing (including washing, rinsing, drying)?: A Lot Help from another person to put on and taking off regular upper body clothing?: A Little Help from another person to put on and taking off regular lower body clothing?: A Lot 6 Click Score: 13   End of Session Equipment Utilized During Treatment: Oxygen (high flow 5L)  Activity Tolerance: Patient limited by fatigue Patient left: in bed;with call bell/phone within reach  OT Visit Diagnosis: Unsteadiness on feet (R26.81);Pain;Muscle weakness (generalized) (M62.81)                Time: 8588-5027 OT Time Calculation (min): 36 min Charges:  OT General Charges $  OT Visit: 1 Visit OT Evaluation $OT Eval Moderate Complexity: 1 Mod OT Treatments $Self Care/Home Management : 8-22 mins  Tyrone Schimke, OT Acute Rehabilitation Services Pager: [phone] Office: [phone]   Hortencia Pilar 09/03/2021, 2:05 PM

## 2021-09-03 NOTE — TOC Initial Note (Signed)
Transition of Care [facility]) - Initial/Assessment Note    Patient Details  Name: Kristen [name] MRN: [id number] Date of Birth: 01-25-1946  Transition of [facility] CM/SW Contact:    Leeroy Cha, RN Phone Number: 09/03/2021, 8:39 AM  Clinical Narrative:                 75 y.o. female with medical history significant of COPD with chronic hypoxic respiratory failure on 3 L/min oxygen at baseline, chronic A-fib, venous insufficiency, hx of transient hypotension, hospital admission in July of this year for aspiration pneumonia and acute on chronic hypoxic respiratory failure.  She was brought to [facility] ED from home by EMS due to ongoing diarrhea x 5 days, productive cough with associated right-sided chest pain and chills, with progressive generalized weakness.  Denies abdominal pain, nausea, vomiting, dysuria, focal weakness numbness or tingling, headaches or other recent symptoms.  Reports increasing her oxygen briefly to 3.5 L/min mostly due to feeling anxious.  Otherwise has been on her usual 3 L/min.     ED Course: afebrile, stable vitals but soft blood pressures of 106/76 and later 98/64.  Labs notable for leukocytosis 17.5k, mild hyponatremia 131.  Chest xray showed new extensive consolidation in the lateral right middle and lower lung, consistent with PNA, small right pleural effusion and chronic hyperexpansion compatible with emphysema.     Patient was started on IV antibiotics and admitted for further evaluation and management.      Patient is followed by the IM residency service at [facility], but this states she would prefer to be admitted here to [facility] this time.    TOC PLAN OF CARE: From home alone has family in area. Plan is to return to home  Expected Discharge Plan: Home/Self Care Barriers to Discharge: Continued Medical Work up   Patient Goals and CMS Choice Patient states their goals for this hospitalization and ongoing recovery are:: to go home CMS Medicare.gov Compare  Post Acute Care list provided to:: Patient Choice offered to / list presented to : Patient  Expected Discharge Plan and Services Expected Discharge Plan: Home/Self Care   Discharge Planning Services: CM Consult   Living arrangements for the past 2 months: Single Family Home                                      Prior Living Arrangements/Services Living arrangements for the past 2 months: Single Family Home Lives with:: Self Patient language and need for interpreter reviewed:: Yes Do you feel safe going back to the place where you live?: Yes            Criminal Activity/Legal Involvement Pertinent to Current Situation/Hospitalization: No - Comment as needed  Activities of Daily Living Home Assistive Devices/Equipment: Eyeglasses, Oxygen, Nebulizer (reading glasses) ADL Screening (condition at time of admission) Patient's cognitive ability adequate to safely complete daily activities?: Yes Is the patient deaf or have difficulty hearing?: No Does the patient have difficulty seeing, even when wearing glasses/contacts?: No Does the patient have difficulty concentrating, remembering, or making decisions?: No Patient able to express need for assistance with ADLs?: Yes Does the patient have difficulty dressing or bathing?: No Independently performs ADLs?: Yes (appropriate for developmental age) Does the patient have difficulty walking or climbing stairs?: Yes Weakness of Legs: Both Weakness of Arms/Hands: Both  Permission Sought/Granted  Emotional Assessment Appearance:: Appears stated age     Orientation: : Oriented to Self, Oriented to Place, Oriented to  Time, Oriented to Situation Alcohol / Substance Use: Not Applicable Psych Involvement: No (comment)  Admission diagnosis:  Aspiration pneumonia ([place]) [J69.0] Community acquired pneumonia [J18.9] Community acquired pneumonia, unspecified laterality [J18.9] Patient Active Problem List    Diagnosis Date Noted   Community acquired pneumonia 09/01/2021   Diarrhea 09/01/2021   Chronic respiratory failure with hypoxia ([place]) 09/01/2021   Hypotension    Pressure injury of skin 06/08/2021   CAP (community acquired pneumonia) 05/29/2021   Aspiration pneumonia ([place]) 05/29/2021   Atrial fibrillation with RVR ([place]) 05/27/2021   Cellulitis of left lower extremity 05/06/2021   Aortic atherosclerosis ([place]) 03/07/2021   Opiate use 03/05/2021   Need for assessment by dentistry for poor dentition 03/05/2021   Abdominal pain 02/06/2021   Health care maintenance 12/27/2020   AF (paroxysmal atrial fibrillation) ([place]) 12/16/2020   Nausea 11/07/2020   Lower extremity ulceration ([place]) 10/04/2020   Generalized anxiety disorder 08/27/2020   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 [address]Phone: [phone]: [phone]    Social Determinants of Health (SDOH) Interventions    Readmission Risk Interventions Readmission Risk Prevention Plan 06/17/2021 06/02/2021 09/29/2020  Transportation Screening Complete Complete Complete  PCP or Specialist Appt within 5-7 Days - - Complete  Home Care Screening - - Complete  Medication Review (RN CM) - - Complete  Medication Review (RN Care Manager) - Complete -  PCP or Specialist appointment within 3-5 days of discharge - Complete -  [place]- Complete -  SW Recovery Care/Counseling Consult Complete Complete -  Palliative Care Screening Complete Complete -  Skilled Nursing Facility Complete Complete -  Some recent data might be hidden

## 2021-09-04 ENCOUNTER — Encounter (HOSPITAL_COMMUNITY): Payer: Self-pay | Admitting: Internal Medicine

## 2021-09-04 ENCOUNTER — Other Ambulatory Visit (HOSPITAL_COMMUNITY): Payer: Medicare Other

## 2021-09-04 ENCOUNTER — Inpatient Hospital Stay (HOSPITAL_COMMUNITY): Payer: Medicare Other

## 2021-09-04 DIAGNOSIS — R778 Other specified abnormalities of plasma proteins: Secondary | ICD-10-CM

## 2021-09-04 DIAGNOSIS — R197 Diarrhea, unspecified: Secondary | ICD-10-CM

## 2021-09-04 DIAGNOSIS — E43 Unspecified severe protein-calorie malnutrition: Secondary | ICD-10-CM

## 2021-09-04 DIAGNOSIS — A419 Sepsis, unspecified organism: Secondary | ICD-10-CM | POA: Diagnosis not present

## 2021-09-04 DIAGNOSIS — J449 Chronic obstructive pulmonary disease, unspecified: Secondary | ICD-10-CM | POA: Diagnosis not present

## 2021-09-04 DIAGNOSIS — R0902 Hypoxemia: Secondary | ICD-10-CM

## 2021-09-04 DIAGNOSIS — I480 Paroxysmal atrial fibrillation: Secondary | ICD-10-CM | POA: Diagnosis not present

## 2021-09-04 DIAGNOSIS — J690 Pneumonitis due to inhalation of food and vomit: Secondary | ICD-10-CM

## 2021-09-04 DIAGNOSIS — J9621 Acute and chronic respiratory failure with hypoxia: Secondary | ICD-10-CM

## 2021-09-04 LAB — BLOOD GAS, ARTERIAL
Acid-Base Excess: 1.8 mmol/L (ref 0.0–2.0)
Bicarbonate: 26.4 mmol/L (ref 20.0–28.0)
O2 Content: 10 L/min
O2 Saturation: 89.6 %
Patient temperature: 98.6
pCO2 arterial: 44.3 mmHg (ref 32.0–48.0)
pH, Arterial: 7.394 (ref 7.350–7.450)
pO2, Arterial: 58.5 mmHg — ABNORMAL LOW (ref 83.0–108.0)

## 2021-09-04 LAB — CBC
HCT: 27.3 % — ABNORMAL LOW (ref 36.0–46.0)
Hemoglobin: 8.8 g/dL — ABNORMAL LOW (ref 12.0–15.0)
MCH: 28.9 pg (ref 26.0–34.0)
MCHC: 32.2 g/dL (ref 30.0–36.0)
MCV: 89.8 fL (ref 80.0–100.0)
Platelets: 507 10*3/uL — ABNORMAL HIGH (ref 150–400)
RBC: 3.04 MIL/uL — ABNORMAL LOW (ref 3.87–5.11)
RDW: 14.5 % (ref 11.5–15.5)
WBC: 20 10*3/uL — ABNORMAL HIGH (ref 4.0–10.5)
nRBC: 0 % (ref 0.0–0.2)

## 2021-09-04 LAB — BASIC METABOLIC PANEL
Anion gap: 7 (ref 5–15)
BUN: 11 mg/dL (ref 8–23)
CO2: 27 mmol/L (ref 22–32)
Calcium: 8.6 mg/dL — ABNORMAL LOW (ref 8.9–10.3)
Chloride: 101 mmol/L (ref 98–111)
Creatinine, Ser: 0.56 mg/dL (ref 0.44–1.00)
GFR, Estimated: >60 mL/min (ref >60)
Glucose, Bld: 119 mg/dL — ABNORMAL HIGH (ref 70–99)
Potassium: 3.1 mmol/L — ABNORMAL LOW (ref 3.5–5.1)
Sodium: 135 mmol/L (ref 135–145)

## 2021-09-04 LAB — MAGNESIUM: Magnesium: 1.7 mg/dL (ref 1.7–2.4)

## 2021-09-04 LAB — TROPONIN I (HIGH SENSITIVITY)
Troponin I (High Sensitivity): 407 ng/L (ref <18)
Troponin I (High Sensitivity): 384 ng/L (ref <18)

## 2021-09-04 LAB — BRAIN NATRIURETIC PEPTIDE: B Natriuretic Peptide: 942.6 pg/mL — ABNORMAL HIGH (ref 0.0–100.0)

## 2021-09-04 MED ORDER — POTASSIUM CHLORIDE CRYS ER 20 MEQ PO TBCR
40.0000 meq | EXTENDED_RELEASE_TABLET | Freq: Once | ORAL | Status: AC
  Administered 2021-09-04: 40 meq via ORAL
  Filled 2021-09-04: qty 2

## 2021-09-04 MED ORDER — FUROSEMIDE 10 MG/ML IJ SOLN
20.0000 mg | Freq: Once | INTRAMUSCULAR | Status: AC
  Administered 2021-09-04: 20 mg via INTRAVENOUS
  Filled 2021-09-04: qty 2

## 2021-09-04 MED ORDER — MAGNESIUM SULFATE IN D5W 1-5 GM/100ML-% IV SOLN
1.0000 g | Freq: Once | INTRAVENOUS | Status: AC
  Administered 2021-09-04: 1 g via INTRAVENOUS
  Filled 2021-09-04: qty 100

## 2021-09-04 MED ORDER — FUROSEMIDE 10 MG/ML IJ SOLN
20.0000 mg | Freq: Once | INTRAMUSCULAR | Status: DC
  Filled 2021-09-04: qty 2

## 2021-09-04 MED ORDER — ARFORMOTEROL TARTRATE 15 MCG/2ML IN NEBU
15.0000 ug | INHALATION_SOLUTION | Freq: Two times a day (BID) | RESPIRATORY_TRACT | Status: DC
  Administered 2021-09-04 – 2021-09-17 (×26): 15 ug via RESPIRATORY_TRACT
  Filled 2021-09-04 (×26): qty 2

## 2021-09-04 MED ORDER — PIPERACILLIN-TAZOBACTAM 3.375 G IVPB 30 MIN: 3.3750 g | Freq: Four times a day (QID) | INTRAVENOUS | Status: DC

## 2021-09-04 MED ORDER — PIPERACILLIN-TAZOBACTAM 3.375 G IVPB
3.3750 g | Freq: Three times a day (TID) | INTRAVENOUS | Status: AC
  Administered 2021-09-04 – 2021-09-10 (×20): 3.375 g via INTRAVENOUS
  Filled 2021-09-04 (×20): qty 50

## 2021-09-04 MED ORDER — BUDESONIDE 0.5 MG/2ML IN SUSP
0.5000 mg | Freq: Two times a day (BID) | RESPIRATORY_TRACT | Status: DC
  Administered 2021-09-04 – 2021-09-17 (×26): 0.5 mg via RESPIRATORY_TRACT
  Filled 2021-09-04 (×26): qty 2

## 2021-09-04 NOTE — Consult Note (Signed)
NAME:  Kristen [name], MRN:  [id number], DOB:  [birth year], LOS: 2 ADMISSION DATE:  09/01/2021, CONSULTATION DATE:  09/04/2021 REFERRING MD:  Dr. Grandville Silos, CHIEF COMPLAINT:  Resp failure   History of Present Illness:  75 year old female with past medical history significant for former smoker, COPD, chronic hypoxic respiratory failure on home O2 3L [place], HFpEF, Afib on Xarelto, venous/ arterial insufficiency, and protein calorie malnutrition admitted to [facility] on 10/10 with sepsis, hypoxic respiratory failure, and pneumonia.   She presented to ER due to progressive generalized weakness, right sided chest pain, chills, productive cough with greenish-brown sputum, and diarrhea x 5 days.   She reports she was trying to avoid coming to the hospital and waited but now regrets waiting.  Had increased her home O2 to 3.5 L mostly due to feeling anxious.  She denies any fever, hemoptysis, dysphagia, or choking episodes.  She is divorced, lives with her sister, and has two children who live in [place].   Found to be afebrile with with soft blood pressures, improved after albumin, and labs showing leukocytosis, Na 131, BNP 942, Flu/ SARs neg, negative urine legionella and strep, and CXR with new extensive consolidation in the lateral right midlung and right base c/w with pneumonia, chronic hyperexpansion, and small right pleural effusion.  She since has had an increasing O2 requirement, placed on high flow [place] but now with trial of BiPAP.  Started on low dose lasix this morning for volume overload.  She was treated in July 2022 for an aspiration pneumonia, therefore started on CAP coverage, changed to unasyn but coverage expanded to zosyn today given CXR progression.  No further diarrhea since admission.  This is her fifth hospitalization this year for respiratory failure related to COPD and/ or aspiration pneumonia.  She is a DNI.  PCCM consulted for further recommendations.  Pertinent  Medical History  Former smoker, COPD,  chronic hypoxic respiratory failure on home O2 3L [place], Afib on Xarelto, venous/ arterial insufficiency, protein calorie malnutrition, HFpEF, hyponatremia, COVID 05/2021  DNI   Significant Hospital Events: Including procedures, antibiotic start and stop dates in addition to other pertinent events   10/10 admitted with sepsis, pna 10/13 PCCM consulted   Interim History / Subjective:  Reports she was feeling better but her breathing became worse this morning, better after lasix.  She doesn't feel like she needs BiPAP.   Objective   Blood pressure 136/75, pulse (!) 113, temperature 97.7 F (36.5 C), temperature source Axillary, resp. rate 18, height _0  (1.803 m), weight 44 kg, SpO2 98 %.        Intake/Output Summary (Last 24 hours) at 09/04/2021 1111 Last data filed at 09/04/2021 0815 Gross per 24 hour  Intake 1796.25 ml  Output 1200 ml  Net 596.25 ml   Filed Weights   09/02/21 1000 09/02/21 1900 09/04/21 1000  Weight: 41.9 kg 41.9 kg 44 kg   Examination: General:  Very pleasant and talkative elderly cachetic female sitting upright on NIPVV in no distress HEENT: full face mask w/NIPPV Neuro: Alert, oriented, MAE CV: rr, no obvious murmur  PULM:  non labored, clear left apical, rales left basilar, right with diffuse rhonchi, no wheezes noted GI: soft, bs+, NT, ND Extremities: warm/dry, trace pedal edema, no tibial edema, mepilex dressings to LE Skin: no rashes   UOP 1.2L/24hrs +882m/ net +2.845  Resolved Hospital Problem list    Assessment & Plan:   Acute on chronic hypoxic respiratory failure in the setting of right  sided pneumonia, suspected aspiration, acute on chronic diastolic heart failure, and COPD (appears to be end stage but no PFTs, managed by [facility])  - home O2 3L baseline.  Maintained on albuterol HFA, duonebs q6hr prn, dulera and incruse - ABG on [place] HFNC this am 7.394/ 44.3/ 58.5/ 26 - urine legionella and strep negative P:  - CXR today with  worsening right opacity in RUL and remainder of right lobe, ongoing small right effusion - continue BiPAP prn for increased work of breathing with breaks back to salter HFNC.  Could consider heated high flow [place] as another option, as she is a DNI.   - wean supplemental O2 for sat goal 88-94% - diuresis per primary team.  S/p lasix 59m with some symptomatic improvement per patient, repeat dose scheduled for 1700 - echo pending - continue zosyn for now - continue BDs- xopenex/ atrovent q 6 and change to brovanna/ pulmicort nebs while in PCU then switch back to dulera prior to discharge - recommend SLP prior to discharge given recurrent suspected aspiration pna - will need repeat imaging in 4-6 weeks to check for resolution.  Can follow- up with IM or she can follow up in pulmonary office.    Remainder per primary team.    Sepsis secondary to pneumonia Diarrhea- resolved PAF- on xarelto, in NSR Prolonged Qtc Chronic hyponatremia Moderate protein calorie malnutrition   Best Practice (right click and "Reselect all SmartList Selections" daily)   Diet/type: Regular consistency (see orders) DVT prophylaxis: DOAC GI prophylaxis: PPI Lines: N/A Foley:  N/A Code Status:  limited Last date of multidisciplinary goals of care discussion [per primary]  Labs   CBC: Recent Labs  Lab 09/01/21 1415 09/02/21 0500 09/03/21 0545 09/04/21 0234  WBC 17.5* 13.3* 20.2* 20.0*  NEUTROABS 14.9*  --   --   --   HGB 11.1* 10.4* 10.2* 8.8*  HCT 35.4* 32.6* 31.0* 27.3*  MCV 91.2 90.3 87.8 89.8  PLT 501* 514* 567* 507*    Basic Metabolic Panel: Recent Labs  Lab 09/01/21 1415 09/02/21 0500 09/03/21 0545 09/04/21 0234  NA 131* 129* 130* 135  K 4.1 2.9* 3.8 3.1*  CL 95* 96* 98 101  CO2 _0 GLUCOSE 92 88 95 119*  BUN _1 CREATININE 0.83 0.60 0.65 0.56  CALCIUM 7.9* 7.8* 7.8* 8.6*  MG  --   --  1.1* 1.7   GFR: Estimated Creatinine Clearance: 41.6 mL/min (by C-G formula  based on SCr of 0.56 mg/dL). Recent Labs  Lab 09/01/21 1402 09/01/21 1415 09/01/21 1601 09/02/21 0500 09/03/21 0545 09/04/21 0234  WBC  --  17.5*  --  13.3* 20.2* 20.0*  LATICACIDVEN 1.9  --  0.9  --   --   --     Liver Function Tests: Recent Labs  Lab 09/01/21 1415  AST 22  ALT 10  ALKPHOS 85  BILITOT 1.2  PROT 6.1*  ALBUMIN 2.3*   No results for input(s): LIPASE, AMYLASE in the last 168 hours. No results for input(s): AMMONIA in the last 168 hours.  ABG    Component Value Date/Time   PHART 7.394 09/04/2021 0811   PCO2ART 44.3 09/04/2021 0811   PO2ART 58.5 (L) 09/04/2021 0811   HCO3 26.4 09/04/2021 0811   TCO2 42 (H) 02/22/2021 1655   ACIDBASEDEF 2.0 06/28/2020 1750   O2SAT 89.6 09/04/2021 0811     Coagulation Profile: Recent Labs  Lab 09/01/21 1415  INR 1.9*  Cardiac Enzymes: No results for input(s): CKTOTAL, CKMB, CKMBINDEX, TROPONINI in the last 168 hours.  HbA1C: Hgb A1c MFr Bld  Date/Time Value Ref Range Status  06/29/2020 04:27 PM 5.6 4.8 - 5.6 % Final    Comment:    (NOTE) Pre diabetes:          5.7%-6.4%  Diabetes:              >6.4%  Glycemic control for   <7.0% adults with diabetes     CBG: No results for input(s): GLUCAP in the last 168 hours.  Review of Systems:   Review of Systems  Constitutional:  Positive for malaise/fatigue. Negative for chills and fever.  Respiratory:  Positive for cough, sputum production, shortness of breath and wheezing. Negative for hemoptysis.   Cardiovascular:  Positive for chest pain. Negative for palpitations and leg swelling.       Right pleuritic chest pain  Gastrointestinal:  Positive for diarrhea and nausea. Negative for abdominal pain and vomiting.  Neurological:  Negative for focal weakness and loss of consciousness.   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   Surgical History:   Past Surgical History:  Procedure Laterality Date   VASCULAR SURGERY       Social History:   reports that she quit smoking about 22 months ago. Her smoking use included cigarettes. She smoked an average of .25 packs per day. She has never used smokeless tobacco. She reports current alcohol use. She reports that she does not use drugs.   Family History:  Her family history includes Diabetes in her father and mother; Diabetes Mellitus II in her mother; Diverticulitis in her sister.   Allergies Allergies  Allergen Reactions   Gabapentin Other (See Comments)    Hallucination/ nightmares     Home Medications  Prior to Admission medications   Medication Sig Start Date End Date Taking? Authorizing Provider  acetaminophen (TYLENOL) 325 MG tablet Take 325 mg by mouth every 6 (six) hours as needed for moderate pain.   Yes [provider]  albuterol (VENTOLIN HFA) 108 (90 Base) MCG/ACT inhaler INHALE 1-2 PUFFS BY MOUTH EVERY 6 HOURS AS NEEDED FOR WHEEZE OR SHORTNESS OF BREATH Patient taking differently: Inhale 2 puffs into the lungs every 6 (six) hours as needed. 07/17/21  Yes Marianna Payment, MD  amiodarone (PACERONE) 200 MG tablet TAKE 1 TABLET BY MOUTH EVERY DAY Patient taking differently: Take 200 mg by mouth daily. 08/05/21  Yes [name], MD  busPIRone (BUSPAR) 10 MG tablet Take 1 tablet (10 mg total) by mouth daily. 07/03/21  Yes Katsadouros, Vasilios, MD  docusate sodium (COLACE) 100 MG capsule Take 1 capsule (100 mg total) by mouth 2 (two) times daily as needed for mild constipation. 06/03/21  Yes Maudie Mercury, MD  fluticasone [facility]) 50 MCG/ACT nasal spray Place 1 spray into both nostrils daily as needed for allergies or rhinitis. 03/31/21  Yes Katsadouros, Vasilios, MD  ipratropium-albuterol (DUONEB) 0.5-2.5 (3) MG/3ML SOLN TAKE 3 MLS BY NEBULIZATION EVERY 6 (SIX) HOURS AS NEEDED  (SHORTNESS OF BREATH/WHEEZING.). 07/31/21 10/29/21 Yes [name], MD  Magnesium 250 MG TABS Take 250 mg by mouth daily.   Yes [provider]  metoprolol succinate (TOPROL-XL) 25 MG 24 hr tablet Take 25 mg by mouth 2 (two) times daily. 07/29/21  Yes [provider]  mirtazapine (REMERON) 15 MG tablet Take 1 tablet (15 mg total) by mouth at bedtime as needed (sleep). 03/18/21  Yes Mosetta Anis, MD  mometasone-formoterol [facility]) 200-5 MCG/ACT AERO Inhale 1 puff into the lungs in the morning and at bedtime. 07/17/21  Yes Marianna Payment, MD  Multiple Vitamin (MULTIVITAMIN WITH MINERALS) TABS tablet Take 1 tablet by mouth daily.   Yes [provider]  ondansetron (ZOFRAN) 4 MG tablet TAKE 1 TABLET BY MOUTH DAILY AS NEEDED FOR NAUSEA OR VOMITING. Patient taking differently: Take 4 mg by mouth every 8 (eight) hours as needed for nausea or vomiting. 08/05/21  Yes [name], MD  OXYGEN Inhale 3 L/min into the lungs continuous.   Yes [provider]  Rivaroxaban (XARELTO) 15 MG TABS tablet Take 1 tablet (15 mg total) by mouth daily. 08/18/21  Yes [name], MD  umeclidinium bromide (INCRUSE ELLIPTA) 62.5 MCG/INH AEPB INHALE 1 PUFF BY MOUTH EVERY DAY Patient taking differently: Inhale 1 puff into the lungs daily. 07/17/21  Yes Marianna Payment, MD  polyethylene glycol (MIRALAX / GLYCOLAX) 17 g packet Take 17 g by mouth daily as needed for moderate constipation. Patient not taking: No sig reported 06/05/21   Maudie Mercury, MD     Critical care time: n/a       Kennieth Rad, ACNP [facility] 09/04/2021, 1:17 PM  See Amion for pager If no response to pager, please call PCCM consult pager After 7:00 pm call Elink

## 2021-09-04 NOTE — Progress Notes (Signed)
PT Cancellation Note  Patient Details Name: Kristen [name] MRN: [id number] DOB: [birth year]   Cancelled Treatment:     Critical Lab Troponin 384 Will need to hold off for today but continue to follow.   Nathanial Rancher 09/04/2021, 1:28 PM

## 2021-09-04 NOTE — Consult Note (Signed)
CARDIOLOGY CONSULT NOTE  Patient ID: Kristen [name] MRN: [id number] DOB/AGE: 1946-02-26 75 y.o.  Admit date: 09/01/2021 Primary Physician [name], MD Primary Cardiologist Freada Bergeron, MD Chief Complaint  SOB Requesting  NA  HPI:   The patient is 75 years old.  She has a past history of paroxysmal atrial fibrillation diagnosed in January.  She has a history of COPD and home O2.  She has had multiple hospitalizations we last saw her consultation in July.  During that hospitalization she was attempted cardioversion x3 in the emergency room but did not convert and eventually converted with IV amiodarone.  She is now admitted with acute on chronic hypoxic respiratory failure.  She has multilobular pneumonia.  She is being managed with bronchodilators and antibiotics.  She is also being managed for probable volume overload.  During all of this troponin was drawn and it was mildly elevated.  We are consulted for the elevated troponin.  She has been hypotensive and was managed with albumin.  BNP was mildly elevated at 942.  She was subsequently treated with IV Lasix.  EKG demonstrates sinus tachycardia with no acute ST segment changes.  Echocardiogram is pending  The patient reports to me that her predominant complaint was diarrhea.    She does report right sided chest pain that felt like a punch and was constant for one week.  She said she had a productive cough.  She reports that she walks through her house without a walker and that she has had SOB and is on chronic O2.  She is not describing palpitations, presyncope or syncope.  She is not having weight gain, PND or orthopnea.  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   08/31/2021   albuterol (VENTOLIN HFA) 108 (90 Base) MCG/ACT inhaler INHALE 1-2 PUFFS BY MOUTH EVERY 6 HOURS AS NEEDED FOR WHEEZE OR SHORTNESS OF BREATH (Patient taking differently: Inhale 2 puffs into the lungs every 6 (six) hours as needed.) 17 each 1 08/31/2021   amiodarone (PACERONE) 200 MG tablet TAKE 1 TABLET BY MOUTH EVERY DAY (Patient taking differently: Take 200 mg by mouth daily.) 30 tablet 0 09/01/2021   busPIRone (BUSPAR) 10 MG tablet Take 1 tablet (10 mg total) by mouth daily. 90 tablet 2 09/01/2021   docusate sodium (COLACE) 100 MG capsule Take 1 capsule (100 mg total) by mouth 2 (two) times daily as needed for mild constipation. 10 capsule 0 Past Month   fluticasone (FLONASE) 50 MCG/ACT nasal spray Place 1 spray into both nostrils daily as needed for allergies or rhinitis. 48 mL 1 07/10/2021   ipratropium-albuterol (DUONEB) 0.5-2.5 (3) MG/3ML SOLN TAKE 3 MLS BY NEBULIZATION EVERY 6 (SIX) HOURS AS NEEDED (SHORTNESS OF BREATH/WHEEZING.). 360 mL 1 08/31/2021   Magnesium 250 MG TABS  Take 250 mg by mouth daily.   08/31/2021   metoprolol succinate (TOPROL-XL) 25 MG 24 hr tablet Take 25 mg by mouth 2 (two) times daily.   09/01/2021 at 9 am   mirtazapine (REMERON) 15 MG tablet Take 1 tablet (15 mg total) by mouth at bedtime as needed (sleep). 90 tablet 2 08/31/2021   mometasone-formoterol (DULERA) 200-5 MCG/ACT AERO Inhale 1 puff into the lungs in the morning and at bedtime. 13 each 2 09/01/2021   Multiple Vitamin (MULTIVITAMIN WITH MINERALS) TABS tablet Take 1 tablet by mouth daily.   08/29/2021   ondansetron (ZOFRAN) 4 MG tablet  TAKE 1 TABLET BY MOUTH DAILY AS NEEDED FOR NAUSEA OR VOMITING. (Patient taking differently: Take 4 mg by mouth every 8 (eight) hours as needed for nausea or vomiting.) 30 tablet 0 08/29/2021   [EXPIRED] oxyCODONE-acetaminophen (PERCOCET) 10-325 MG tablet Take 1 tablet by mouth every 8 (eight) hours as needed for pain. 90 tablet 0 08/31/2021   OXYGEN Inhale 3 L/min into the lungs continuous.   09/01/2021   Rivaroxaban (XARELTO) 15 MG TABS tablet Take 1 tablet (15 mg total) by mouth daily. 30 tablet 0 09/01/2021 at 9 am   umeclidinium bromide (INCRUSE ELLIPTA) 62.5 MCG/INH AEPB INHALE 1 PUFF BY MOUTH EVERY DAY (Patient taking differently: Inhale 1 puff into the lungs daily.) 30 each 4 09/01/2021   polyethylene glycol (MIRALAX / GLYCOLAX) 17 g packet Take 17 g by mouth daily as needed for moderate constipation. (Patient not taking: No sig reported) 14 each 0 Not Taking   Family History  Problem Relation Age of Onset   Diabetes Mother    Diabetes Mellitus II Mother    Diabetes Father    Diverticulitis Sister     Social History   Socioeconomic History   Marital status: Divorced    Spouse name: Not on file   Number of children: Not on file   Years of education: Not on file   Highest education level: Not on file  Occupational History    Comment: RETIRED  Tobacco Use   Smoking status: Former    Packs/day: 0.25    Types: Cigarettes    Quit date: 10/16/2019    Years since quitting: 1.8   Smokeless tobacco: Never   Tobacco comments:    2-3 per day   Vaping Use   Vaping Use: Never used  Substance and Sexual Activity   Alcohol use: Yes    Comment: occasionally   Drug use: Never   Sexual activity: Not on file  Other Topics Concern   Not on file  Social History Narrative   Not on file   Social Determinants of Health   Financial Resource Strain: Not on file  Food Insecurity: Not on file  Transportation Needs: Not on file  Physical Activity: Not on file  Stress: Not on file  Social  Connections: Not on file  Intimate Partner Violence: Not on file     ROS:    As stated in the HPI and negative for all other systems.  Physical Exam: Blood pressure (!) 81/49, pulse 92, temperature 98.6 F (37 C), temperature source Oral, resp. rate (!) 26, height _0  (1.803 m), weight 44 kg, SpO2 (!) 87 %.  GENERAL:  Chronically ill appearing and cachectic but not in acute distress HEENT:  Pupils equal round and reactive, fundi not visualized, oral mucosa unremarkable NECK:  No jugular venous distention, waveform within normal limits, carotid upstroke brisk and symmetric, no bruits, no thyromegaly  LYMPHATICS:  No cervical, inguinal adenopathy LUNGS:  Decreased breath sounds with coarse crackles and rhonchi BACK:  No CVA tenderness CHEST:  Unremarkable HEART:  PMI not displaced or sustained,S1 and S2 within normal limits, no S3, no S4, no clicks, no rubs, no murmurs ABD:  Flat, positive bowel sounds normal in frequency in pitch, no bruits, no rebound, no guarding, no midline pulsatile mass, no hepatomegaly, no splenomegaly EXT:  2 plus pulses upper and absent DP/PT, no edema, no cyanosis no clubbing SKIN:  No rashes no nodules NEURO:  Cranial nerves II through XII grossly intact, motor grossly intact throughout PSYCH:  Cognitively intact, oriented to person place and time   Labs: Lab Results  Component Value Date   BUN 11 09/04/2021   Lab Results  Component Value Date   CREATININE 0.56 09/04/2021   Lab Results  Component Value Date   NA 135 09/04/2021   K 3.1 (L) 09/04/2021   CL 101 09/04/2021   CO2 27 09/04/2021   No results found for: TROPONINI Lab Results  Component Value Date   WBC 20.0 (H) 09/04/2021   HGB 8.8 (L) 09/04/2021   HCT 27.3 (L) 09/04/2021   MCV 89.8 09/04/2021   PLT 507 (H) 09/04/2021   No results found for: CHOL, HDL, LDLCALC, LDLDIRECT, TRIG, CHOLHDL Lab Results  Component Value Date   ALT 10 09/01/2021   AST 22 09/01/2021   ALKPHOS 85  09/01/2021   BILITOT 1.2 09/01/2021      Radiology:   CXR: Increased confluent opacity projecting over the right upper lobe with worsened aeration throughout the remainder of the right lung. Findings concerning for worsening pneumonia with a persistent parapneumonic effusion. As before, follow-up radiographs following appropriate treatment are recommended to ensure resolution.   YRY:GBBHQ tachycardia, no acute ST wave changes  ASSESSMENT AND PLAN:   ELEVATED TROPONIN:    The mildly elevated troponin is non specific.  She is not an invasive or interventional candidate.    HYPOXEMIC RESPIRATORY FAILURE:  This is related pneumonia superimposed on severe chronic lung disease.    There might be an element of acute diastolic HF.  Echo is pending.    She is oxygenating better with one dose of IV Lasix.    PAF:  Continue Xarelto.     SignedMinus Breeding 09/04/2021, 5:59 PM

## 2021-09-04 NOTE — Progress Notes (Addendum)
PROGRESS NOTE    Kristen [name]  LEX:[id number] DOB: 23-Jul-1946 DOA: 09/01/2021 PCP: [name], MD   No chief complaint on file.   Brief Narrative:  Kristen [name] is a 75 y.o. female with medical history significant of COPD with chronic hypoxic respiratory failure on 3 L/min oxygen at baseline, chronic A-fib, venous insufficiency, hx of transient hypotension, hospital admission in July of this year for aspiration pneumonia and acute on chronic hypoxic respiratory failure.  She was brought to [facility] ED from home by EMS due to ongoing diarrhea x 5 days, productive cough with associated right-sided chest pain and chills, with progressive generalized weakness.  Denies abdominal pain, nausea, vomiting, dysuria, focal weakness numbness or tingling, headaches or other recent symptoms.  Reports increasing her oxygen briefly to 3.5 L/min mostly due to feeling anxious.  Otherwise has been on her usual 3 L/min. Chest xray showed new extensive consolidation in the lateral right middle and lower lung, consistent with PNA, small right pleural effusion and chronic hyperexpansion compatible with emphysema. Patient is followed by the IM residency service at [facility], but this states she would prefer to be admitted here to [facility] this time.   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 -On admission patient noted to have met criteria for sepsis with leukocytosis, tachypnea, pneumonia. -Patient with a rising leukocytosis however remains afebrile.  -Leukocytosis at 20.0.   -Patient with increased O2 requirements on 10 L high flow nasal cannula with sats of 97%.   -Blood cultures pending with no growth to date x2 days, MRSA PCR negative.  Urine cultures with multiple species. -Discontinue IV Unasyn and broaden antibiotic coverage to IV Zosyn.   -Supportive care.    2.  Hypotension -Patient with systolic blood pressures noted to be low in the 80s on 09/03/2021.   -IV albumin every 6 hours x24 hours given yesterday with improvement with blood pressure with systolics in the 449Q.   -Monitor with diuresis.    3.  Acute on chronic hypoxic respiratory failure in the setting of COPD likely secondary to aspiration pneumonia and probable volume overload/acute diastolic CHF exacerbation -Likely secondary to pneumonia/aspiration pneumonia in the setting of possible volume overload. -Patient with use of accessory muscles of respiration this morning with complaints of shortness of breath and currently on 10 L high flow nasal cannula with sats of 97%. -BNP obtained at 942.6. -2D echo from 10/23/2020 with a EF of 55 to 75%,FFMB, grade 1 diastolic dysfunction, interventricular septum flattened in diastole consistent with right ventricular volume overload, low normal right ventricular systolic function, right ventricular size normal. -Patient on home O2 with a baseline of 3 L nasal cannula. -Blood pressure improved post IV albumin yesterday. -Repeat chest x-ray this morning pending. -ABG with a pH of 7.394, PCO2 of 44, PO2 of 58.5. -We will give Lasix 20 mg IV every 12 hours x2  doses. -Strict I's and O's. -Trial of BiPAP. -Check a 2D echo. -Cycle cardiac enzymes. -Continue scheduled Xopenex and Atrovent nebs, Dulera, Claritin, PPI. -Discontinue IV Unasyn and  broaden antibiotic coverage to IV Zosyn. -Consult PCCM for further evaluation and management.  4.  Diarrhea -Patient had reported 5 days of diarrhea at home prior to admission. -No bowel movement since admission. -Enteric precautions discontinued.   5.  Paroxysmal atrial fibrillation -Continue amiodarone for rate control.   -Xarelto.  6.  Prolonged QTC -Patient noted to have a QTC of 560 on admission, chronically on amiodarone. -Repeat EKG with resolution of QT prolongation currently at 437. -Keep potassium approximately at 4, magnesium approximately at 2.  #7.  Chronic hyponatremia -Improved sodium at 135.   8.  Chronic venous stasis of bilateral lower extremities/venous insufficiency with ulceration -Continue current wound treatment recommendations per [place] RN  9.  Hypomagnesemia/hypokalemia -Magnesium at 1.7.  Potassium at 3.1.   -Magnesium sulfate 2 g IV x1.  K. Dur 40 mEq p.o. every 4 hours x2 doses.   -Repeat labs in the morning.  9.  Moderate protein calorie malnutrition -Patient cachectic and frail on examination. -Albumin level at 2.3 on admission. -Diet has been liberalized.   -Status post IV albumin every 6 hours x1 day.   -Continue Ensure nutritional supplementation.   -RD consulted.   10.  Goals of care -It is noted that palliative care was to follow-up with patient after recent discharge, continue partial code as noted below, patient is a DO NOT INTUBATE. -We will need palliative care to follow-up in the outpatient setting on discharge.         DVT prophylaxis: Xarelto Code Status: Partial code Family Communication: Updated patient.  No family at bedside. Disposition:   Status is: Inpatient  Remains inpatient appropriate because:Inpatient level of care appropriate due to severity of illness  Dispo: The patient is from: Home              Anticipated d/c is to:  TBD              Patient currently is not medically stable to d/c.   Difficult to place  patient No       Consultants:  Hardy, RN 09/02/2021 PCCM pending  Procedures:  Chest x-ray 09/01/2021, 09/04/2021   Antimicrobials: IV Unasyn 09/01/2021>>>>> 09/04/2021 IV azithromycin 10/10 /2022x1 dose IV Rocephin 10/10/ 2022x1 dose IV Zosyn 09/04/2021>>>>>   Subjective: Patient sitting up in bed.  Complaining of shortness of breath worsening today.  No chest pain.  No abdominal pain.  Complaining of some nausea.  On 10 L high flow nasal cannula with sats of 97%.    Objective: Vitals:   09/04/21 0647 09/04/21 0700 09/04/21 0749 09/04/21 0800  BP:  (!) 144/85  136/75  Pulse:  (!) 105  (!) 113  Resp: _0 Temp:    97.7 F (36.5 C)  TempSrc:    Axillary  SpO2:  92% 94% 98%  Weight:      Height:        Intake/Output Summary (Last 24 hours) at 09/04/2021 0930 Last data filed at 09/04/2021 0815 Gross per 24 hour  Intake 2116.25 ml  Output 1200 ml  Net 916.25 ml    Filed Weights   09/02/21 1000 09/02/21 1900  Weight: 41.9 kg 41.9 kg    Examination:  General exam: Frail.  Cachectic. Respiratory system: Some coarse breath sounds greater on the right, some gurgly breath  sounds.  Speaking in choppy sentences.  Use of accessory muscles of respiration. Cardiovascular system: RRR no murmurs rubs or gallops.  No JVD.  No lower extremity edema.  Gastrointestinal system: Abdomen is soft, nontender, nondistended, positive bowel sounds.  No rebound.  No guarding.  Central nervous system: Alert and oriented.  Moving extremities spontaneously.  No focal neurological deficits.  Extremities: Symmetric 5 x 5 power. Skin: No rashes, lesions or ulcers Psychiatry: Judgement and insight appear normal. Mood & affect appropriate.     Data Reviewed: I have personally reviewed following labs and imaging studies  CBC: Recent Labs  Lab 09/01/21 1415 09/02/21 0500 09/03/21 0545 09/04/21 0234  WBC 17.5* 13.3* 20.2* 20.0*  NEUTROABS 14.9*  --   --   --   HGB  11.1* 10.4* 10.2* 8.8*  HCT 35.4* 32.6* 31.0* 27.3*  MCV 91.2 90.3 87.8 89.8  PLT 501* 514* 567* 507*     Basic Metabolic Panel: Recent Labs  Lab 09/01/21 1415 09/02/21 0500 09/03/21 0545 09/04/21 0234  NA 131* 129* 130* 135  K 4.1 2.9* 3.8 3.1*  CL 95* 96* 98 101  CO2 _0 GLUCOSE 92 88 95 119*  BUN _1 CREATININE 0.83 0.60 0.65 0.56  CALCIUM 7.9* 7.8* 7.8* 8.6*  MG  --   --  1.1* 1.7     GFR: Estimated Creatinine Clearance: 39.6 mL/min (by C-G formula based on SCr of 0.56 mg/dL).  Liver Function Tests: Recent Labs  Lab 09/01/21 1415  AST 22  ALT 10  ALKPHOS 85  BILITOT 1.2  PROT 6.1*  ALBUMIN 2.3*     CBG: No results for input(s): GLUCAP in the last 168 hours.   Recent Results (from the past 240 hour(s))  Blood culture (routine single)     Status: None (Preliminary result)   Collection Time: 09/01/21  2:15 PM   Specimen: BLOOD  Result Value Ref Range Status   Specimen Description   Final    BLOOD LEFT ANTECUBITAL Performed at [facility] [address], [place], [place] [address]    Special Requests   Final    BOTTLES DRAWN AEROBIC AND ANAEROBIC Blood Culture adequate volume Performed at [facility] [address], [place], [place] [address]    Culture   Final    NO GROWTH 3 DAYS Performed at [facility], [place] [address] [place], [place] [address]    Report Status PENDING  Incomplete  Resp Panel by RT-PCR (Flu A&B, Covid)     Status: None   Collection Time: 09/01/21  2:20 PM   Specimen: Nasopharyngeal(NP) swabs in vial transport medium  Result Value Ref Range Status   SARS Coronavirus 2 by RT PCR NEGATIVE NEGATIVE Final    Comment: (NOTE) SARS-CoV-2 target nucleic acids are NOT DETECTED.  The SARS-CoV-2 RNA is generally detectable in upper respiratory specimens during the acute phase of infection. The lowest concentration of SARS-CoV-2 viral copies this assay can detect  is 138 copies/mL. A negative result does not preclude SARS-Cov-2 infection and should not be used as the sole basis for treatment or other patient management decisions. A negative result may occur with  improper specimen collection/handling, submission of specimen other than nasopharyngeal swab, presence of viral mutation(s) within the areas targeted by this assay, and inadequate number of viral copies(<138 copies/mL). A negative result must be combined with clinical observations, patient history, and epidemiological information. The expected result is Negative.  Fact Sheet for Patients:  EntrepreneurPulse.com.au  Fact Sheet for Healthcare Providers:  IncredibleEmployment.be  This test is no t yet approved or cleared by the [place] FDA and  has been authorized for detection and/or diagnosis of SARS-CoV-2 by FDA under an Emergency Use Authorization (EUA). This EUA will remain  in effect (meaning this test can be used) for the duration of the COVID-19 declaration under Section 564(b)(1) of the Act, 21 U.S.C.section 360bbb-3(b)(1), unless the authorization is terminated  or revoked sooner.       Influenza A by PCR NEGATIVE NEGATIVE Final   Influenza B by PCR NEGATIVE NEGATIVE Final    Comment: (NOTE) The Xpert Xpress SARS-CoV-2/FLU/RSV plus assay is intended as an aid in the diagnosis of influenza from Nasopharyngeal swab specimens and should not be used as a sole basis for treatment. Nasal washings and aspirates are unacceptable for Xpert Xpress SARS-CoV-2/FLU/RSV testing.  Fact Sheet for Patients: EntrepreneurPulse.com.au  Fact Sheet for Healthcare Providers: IncredibleEmployment.be  This test is not yet approved or cleared by the [place] FDA and has been authorized for detection and/or diagnosis of SARS-CoV-2 by FDA under an Emergency Use Authorization (EUA). This EUA will remain in effect  (meaning this test can be used) for the duration of the COVID-19 declaration under Section 564(b)(1) of the Act, 21 U.S.C. section 360bbb-3(b)(1), unless the authorization is terminated or revoked.  Performed at [facility], [place] [address], [place], [place] [address]   Urine Culture     Status: Abnormal   Collection Time: 09/01/21  8:00 PM   Specimen: In/Out Cath Urine  Result Value Ref Range Status   Specimen Description   Final    IN/OUT CATH URINE Performed at [facility] [address] [place], [place] [address]    Special Requests   Final    NONE Performed at [facility], [place] [address] [place], [place] [address]    Culture MULTIPLE SPECIES PRESENT, SUGGEST RECOLLECTION (A)  Final   Report Status 09/02/2021 FINAL  Final  MRSA Next Gen by PCR, Nasal     Status: None   Collection Time: 09/02/21 11:34 PM   Specimen: Nasal Mucosa; Nasal Swab  Result Value Ref Range Status   MRSA by PCR Next Gen NOT DETECTED NOT DETECTED Final    Comment: (NOTE) The GeneXpert MRSA Assay (FDA approved for NASAL specimens only), is one component of a comprehensive MRSA colonization surveillance program. It is not intended to diagnose MRSA infection nor to guide or monitor treatment for MRSA infections. Test performance is not FDA approved in patients less than 54 years old. Performed at [facility], [place] [address] [place],  [address]           Radiology Studies: No results found.      Scheduled Meds:  (feeding supplement) PROSource Plus  30 mL Oral BID BM   amiodarone  200 mg Oral Daily   busPIRone  10 mg Oral Daily   Chlorhexidine Gluconate Cloth  6 each Topical Daily   feeding supplement  237 mL Oral BID BM   ipratropium  0.5 mg Nebulization Q6H   levalbuterol  0.63 mg Nebulization Q6H   loratadine  10 mg Oral Daily   mouth rinse  15 mL Mouth Rinse BID   mometasone-formoterol  1 puff  Inhalation BID   multivitamin with minerals  1 tablet Oral Daily   pantoprazole  40 mg Oral Q0600   potassium chloride  40 mEq Oral Once   Rivaroxaban  15 mg Oral Q breakfast   Continuous Infusions:  ampicillin-sulbactam (UNASYN) IV Stopped (09/04/21 0405)   magnesium sulfate bolus IVPB       LOS: 2 days    Time spent: 45 minutes    Irine Seal, MD [facility]   To contact the attending provider between 7A-7P or the covering provider during after hours 7P-7A, please log into the web site [email or web address] and access using universal [place] password for that web site. If you do not have the password, please call the hospital operator.  09/04/2021, 9:30 AM

## 2021-09-05 ENCOUNTER — Inpatient Hospital Stay (HOSPITAL_COMMUNITY): Payer: Medicare Other

## 2021-09-05 DIAGNOSIS — J9601 Acute respiratory failure with hypoxia: Secondary | ICD-10-CM | POA: Diagnosis not present

## 2021-09-05 DIAGNOSIS — J189 Pneumonia, unspecified organism: Secondary | ICD-10-CM | POA: Diagnosis not present

## 2021-09-05 DIAGNOSIS — I480 Paroxysmal atrial fibrillation: Secondary | ICD-10-CM | POA: Diagnosis not present

## 2021-09-05 DIAGNOSIS — I5040 Unspecified combined systolic (congestive) and diastolic (congestive) heart failure: Secondary | ICD-10-CM | POA: Diagnosis not present

## 2021-09-05 DIAGNOSIS — R0602 Shortness of breath: Secondary | ICD-10-CM

## 2021-09-05 DIAGNOSIS — A419 Sepsis, unspecified organism: Secondary | ICD-10-CM | POA: Diagnosis not present

## 2021-09-05 DIAGNOSIS — R778 Other specified abnormalities of plasma proteins: Secondary | ICD-10-CM | POA: Diagnosis not present

## 2021-09-05 DIAGNOSIS — J690 Pneumonitis due to inhalation of food and vomit: Secondary | ICD-10-CM | POA: Diagnosis not present

## 2021-09-05 LAB — CBC WITH DIFFERENTIAL/PLATELET
Abs Immature Granulocytes: 0.33 10*3/uL — ABNORMAL HIGH (ref 0.00–0.07)
Basophils Absolute: 0 10*3/uL (ref 0.0–0.1)
Basophils Relative: 0 %
Eosinophils Absolute: 0.2 10*3/uL (ref 0.0–0.5)
Eosinophils Relative: 1 %
HCT: 27.9 % — ABNORMAL LOW (ref 36.0–46.0)
Hemoglobin: 8.8 g/dL — ABNORMAL LOW (ref 12.0–15.0)
Immature Granulocytes: 2 %
Lymphocytes Relative: 8 %
Lymphs Abs: 1.6 10*3/uL (ref 0.7–4.0)
MCH: 28.9 pg (ref 26.0–34.0)
MCHC: 31.5 g/dL (ref 30.0–36.0)
MCV: 91.8 fL (ref 80.0–100.0)
Monocytes Absolute: 1.5 10*3/uL — ABNORMAL HIGH (ref 0.1–1.0)
Monocytes Relative: 8 %
Neutro Abs: 15.3 10*3/uL — ABNORMAL HIGH (ref 1.7–7.7)
Neutrophils Relative %: 81 %
Platelets: 517 10*3/uL — ABNORMAL HIGH (ref 150–400)
RBC: 3.04 MIL/uL — ABNORMAL LOW (ref 3.87–5.11)
RDW: 14.6 % (ref 11.5–15.5)
WBC: 19 10*3/uL — ABNORMAL HIGH (ref 4.0–10.5)
nRBC: 0 % (ref 0.0–0.2)

## 2021-09-05 LAB — BASIC METABOLIC PANEL
Anion gap: 8 (ref 5–15)
BUN: 14 mg/dL (ref 8–23)
CO2: 25 mmol/L (ref 22–32)
Calcium: 8.1 mg/dL — ABNORMAL LOW (ref 8.9–10.3)
Chloride: 97 mmol/L — ABNORMAL LOW (ref 98–111)
Creatinine, Ser: 0.7 mg/dL (ref 0.44–1.00)
GFR, Estimated: >60 mL/min (ref >60)
Glucose, Bld: 110 mg/dL — ABNORMAL HIGH (ref 70–99)
Potassium: 4.4 mmol/L (ref 3.5–5.1)
Sodium: 130 mmol/L — ABNORMAL LOW (ref 135–145)

## 2021-09-05 LAB — EXPECTORATED SPUTUM ASSESSMENT W GRAM STAIN, RFLX TO RESP C

## 2021-09-05 LAB — ECHOCARDIOGRAM COMPLETE
Area-P 1/2: 4.21 cm2
Calc EF: 47.2 %
Height: 71 in
S' Lateral: 3.55 cm
Single Plane A2C EF: 50.6 %
Single Plane A4C EF: 43.3 %
Weight: 1552.04 oz

## 2021-09-05 LAB — CORTISOL: Cortisol, Plasma: 16.8 ug/dL

## 2021-09-05 LAB — MAGNESIUM: Magnesium: 1.8 mg/dL (ref 1.7–2.4)

## 2021-09-05 MED ORDER — PERFLUTREN LIPID MICROSPHERE
1.0000 mL | INTRAVENOUS | Status: AC | PRN
  Administered 2021-09-05: 2 mL via INTRAVENOUS
  Filled 2021-09-05: qty 10

## 2021-09-05 MED ORDER — NOREPINEPHRINE 4 MG/250ML-% IV SOLN
2.0000 ug/min | INTRAVENOUS | Status: DC
  Administered 2021-09-05: 2 ug/min via INTRAVENOUS
  Filled 2021-09-05: qty 250

## 2021-09-05 MED ORDER — GUAIFENESIN ER 600 MG PO TB12
1200.0000 mg | ORAL_TABLET | Freq: Two times a day (BID) | ORAL | Status: DC
  Administered 2021-09-05 – 2021-09-17 (×24): 1200 mg via ORAL
  Filled 2021-09-05 (×24): qty 2

## 2021-09-05 MED ORDER — LACTATED RINGERS IV BOLUS
500.0000 mL | Freq: Once | INTRAVENOUS | Status: AC
  Administered 2021-09-05: 500 mL via INTRAVENOUS

## 2021-09-05 MED ORDER — SODIUM CHLORIDE 0.9 % IV SOLN
250.0000 mL | INTRAVENOUS | Status: DC
  Administered 2021-09-08: 250 mL via INTRAVENOUS

## 2021-09-05 MED ORDER — MAGNESIUM SULFATE 2 GM/50ML IV SOLN
2.0000 g | Freq: Once | INTRAVENOUS | Status: AC
  Administered 2021-09-05: 2 g via INTRAVENOUS
  Filled 2021-09-05: qty 50

## 2021-09-05 MED ORDER — SORBITOL 70 % SOLN
30.0000 mL | Freq: Once | Status: AC
  Administered 2021-09-05: 30 mL via ORAL
  Filled 2021-09-05: qty 30

## 2021-09-05 MED ORDER — ALBUMIN HUMAN 25 % IV SOLN
25.0000 g | Freq: Once | INTRAVENOUS | Status: AC
  Administered 2021-09-05: 25 g via INTRAVENOUS
  Filled 2021-09-05: qty 100

## 2021-09-05 NOTE — Progress Notes (Addendum)
NAME:  Kristen [name], MRN:  [id number], DOB:  22-Jun-1946, LOS: 3 ADMISSION DATE:  09/01/2021, CONSULTATION DATE:  09/04/2021 REFERRING MD:  Dr. Grandville Silos, CHIEF COMPLAINT:  Resp failure   History of Present Illness:  75 year old female with past medical history significant for former smoker, COPD, chronic hypoxic respiratory failure on home O2 3L [place], HFpEF, Afib on Xarelto, venous/ arterial insufficiency, and protein calorie malnutrition admitted to [facility] on 10/10 with sepsis, hypoxic respiratory failure, and pneumonia.   She presented to ER due to progressive generalized weakness, right sided chest pain, chills, productive cough with greenish-brown sputum, and diarrhea x 5 days.   She reports she was trying to avoid coming to the hospital and waited but now regrets waiting.  Had increased her home O2 to 3.5 L mostly due to feeling anxious.  She denies any fever, hemoptysis, dysphagia, or choking episodes.  She is divorced, lives with her sister, and has two children who live in [place].   Found to be afebrile with with soft blood pressures, improved after albumin, and labs showing leukocytosis, Na 131, BNP 942, Flu/ SARs neg, negative urine legionella and strep, and CXR with new extensive consolidation in the lateral right midlung and right base c/w with pneumonia, chronic hyperexpansion, and small right pleural effusion.  She since has had an increasing O2 requirement, placed on high flow [place] but now with trial of BiPAP.  Started on low dose lasix this morning for volume overload.  She was treated in July 2022 for an aspiration pneumonia, therefore started on CAP coverage, changed to unasyn but coverage expanded to zosyn today given CXR progression.  No further diarrhea since admission.  This is her fifth hospitalization this year for respiratory failure related to COPD and/ or aspiration pneumonia.  She is a DNI.  PCCM consulted for further recommendations.  Pertinent  Medical History  Former smoker, COPD,  chronic hypoxic respiratory failure on home O2 3L [place], Afib on Xarelto, venous/ arterial insufficiency, protein calorie malnutrition, HFpEF, hyponatremia, COVID 05/2021  Significant Hospital Events: Including procedures, antibiotic start and stop dates in addition to other pertinent events   10/10 admitted with sepsis, pna 10/13 PCCM consulted   Interim History / Subjective:   No acute events overnight. Patient is feeling somewhat better this morning. She is on HFNC. She has productive cough.  Objective   Blood pressure (!) 79/52, pulse 86, temperature 98.3 F (36.8 C), temperature source Oral, resp. rate 16, height _0  (1.803 m), weight 44 kg, SpO2 100 %.        Intake/Output Summary (Last 24 hours) at 09/05/2021 1213 Last data filed at 09/05/2021 1045 Gross per 24 hour  Intake 732.07 ml  Output 800 ml  Net -67.93 ml    Filed Weights   09/02/21 1000 09/02/21 1900 09/04/21 1000  Weight: 41.9 kg 41.9 kg 44 kg   Examination: General:  frail elderly woman, no acute distress HEENT: moist mucous membranes, sclera anicteric Neuro: Alert, oriented, moving all extremities CV: rrr, no murmurs PULM:  course breath sounds, no wheezing. GI: soft, bs+, NT, ND Extremities: warm/dry, no edema Skin: no rashes   Resolved Hospital Problem list    Assessment & Plan:  Sepsis due to Pneumonia - Continue zosyn - MRSA screen negative. Urine legionella and strep negative - Fluid resuscitation PRN - random cortisol 16  Acute on Chronic Hypoxemic Respiratory Failure Pneumonia COPD, 3L O2 baseline - Continue nebulizer treatments - Continue antibiotics as above - PRN Bipap -  Continue HFNC - recommend SLP prior to discharge given recurrent suspected aspiration pna - will need repeat imaging in 4-6 weeks to check for resolution.  Can follow- up with IM or she can follow up in pulmonary office.  Remainder per primary team.    Sepsis secondary to pneumonia Diarrhea- resolved PAF- on  xarelto, in NSR Prolonged Qtc Chronic hyponatremia Moderate protein calorie malnutrition   Best Practice (right click and "Reselect all SmartList Selections" daily)   Diet/type: Regular consistency (see orders) DVT prophylaxis: DOAC GI prophylaxis: PPI Lines: N/A Foley:  N/A Code Status:  limited Last date of multidisciplinary goals of care discussion [per primary]  Labs   CBC: Recent Labs  Lab 09/01/21 1415 09/02/21 0500 09/03/21 0545 09/04/21 0234 09/05/21 0237  WBC 17.5* 13.3* 20.2* 20.0* 19.0*  NEUTROABS 14.9*  --   --   --  15.3*  HGB 11.1* 10.4* 10.2* 8.8* 8.8*  HCT 35.4* 32.6* 31.0* 27.3* 27.9*  MCV 91.2 90.3 87.8 89.8 91.8  PLT 501* 514* 567* 507* 517*     Basic Metabolic Panel: Recent Labs  Lab 09/01/21 1415 09/02/21 0500 09/03/21 0545 09/04/21 0234 09/05/21 0237  NA 131* 129* 130* 135 130*  K 4.1 2.9* 3.8 3.1* 4.4  CL 95* 96* 98 101 97*  CO2 _0 GLUCOSE 92 88 95 119* 110*  BUN _1 CREATININE 0.83 0.60 0.65 0.56 0.70  CALCIUM 7.9* 7.8* 7.8* 8.6* 8.1*  MG  --   --  1.1* 1.7 1.8    GFR: Estimated Creatinine Clearance: 41.6 mL/min (by C-G formula based on SCr of 0.7 mg/dL). Recent Labs  Lab 09/01/21 1402 09/01/21 1415 09/01/21 1601 09/02/21 0500 09/03/21 0545 09/04/21 0234 09/05/21 0237  WBC  --    < >  --  13.3* 20.2* 20.0* 19.0*  LATICACIDVEN 1.9  --  0.9  --   --   --   --    < > = values in this interval not displayed.     Liver Function Tests: Recent Labs  Lab 09/01/21 1415  AST 22  ALT 10  ALKPHOS 85  BILITOT 1.2  PROT 6.1*  ALBUMIN 2.3*    No results for input(s): LIPASE, AMYLASE in the last 168 hours. No results for input(s): AMMONIA in the last 168 hours.  ABG    Component Value Date/Time   PHART 7.394 09/04/2021 0811   PCO2ART 44.3 09/04/2021 0811   PO2ART 58.5 (L) 09/04/2021 0811   HCO3 26.4 09/04/2021 0811   TCO2 42 (H) 02/22/2021 1655   ACIDBASEDEF 2.0 06/28/2020 1750   O2SAT 89.6  09/04/2021 0811      Coagulation Profile: Recent Labs  Lab 09/01/21 1415  INR 1.9*     Cardiac Enzymes: No results for input(s): CKTOTAL, CKMB, CKMBINDEX, TROPONINI in the last 168 hours.  HbA1C: Hgb A1c MFr Bld  Date/Time Value Ref Range Status  06/29/2020 04:27 PM 5.6 4.8 - 5.6 % Final    Comment:    (NOTE) Pre diabetes:          5.7%-6.4%  Diabetes:              >6.4%  Glycemic control for   <7.0% adults with diabetes     CBG: No results for input(s): GLUCAP in the last 168 hours.  Critical care time: 40 minutes    Freda Jackson, MD [place] Office: [phone]   See Amion for personal pager  PCCM on call pager [phone] until 7pm. Please call [place] 7p-7a. [phone]

## 2021-09-05 NOTE — Progress Notes (Addendum)
PROGRESS NOTE    Kristen [name]  OFB:[id number] DOB: 1946-10-05 DOA: 09/01/2021 PCP: [name], MD   No chief complaint on file.   Brief Narrative:  Kristen [name] is a 75 y.o. female with medical history significant of COPD with chronic hypoxic respiratory failure on 3 L/min oxygen at baseline, chronic A-fib, venous insufficiency, hx of transient hypotension, hospital admission in July of this year for aspiration pneumonia and acute on chronic hypoxic respiratory failure.  She was brought to [facility] ED from home by EMS due to ongoing diarrhea x 5 days, productive cough with associated right-sided chest pain and chills, with progressive generalized weakness.  Denies abdominal pain, nausea, vomiting, dysuria, focal weakness numbness or tingling, headaches or other recent symptoms.  Reports increasing her oxygen briefly to 3.5 L/min mostly due to feeling anxious.  Otherwise has been on her usual 3 L/min. Chest xray showed new extensive consolidation in the lateral right middle and lower lung, consistent with PNA, small right pleural effusion and chronic hyperexpansion compatible with emphysema. Patient is followed by the IM residency service at [facility], but this states she would prefer to be admitted here to [facility] this time.   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 -On admission patient noted to have met criteria for sepsis with leukocytosis, tachypnea, pneumonia. -Patient with a rising leukocytosis however started to slowly trend back down.  White count today at 19.0 from 20.  -Patient with increased O2 requirements on 15 L high flow nasal cannula with sats of 92%.   -Blood cultures pending with no growth to date x2 days, MRSA PCR negative.  Urine cultures with multiple species. -Some clinical improvement in terms of respiratory status today from yesterday. -IV antibiotics broadened from Unasyn to IV Zosyn which we will continue for now. -Add Mucinex. -Continue Brovana, Pulmicort, scheduled Xopenex and Atrovent nebs -Supportive care.  2.  Hypotension -Patient with systolic blood pressures noted to be low in the 80s on 09/03/2021.   -IV albumin every 6 hours x24 hours given with some improvement with blood pressure however blood pressure this morning soft with a pressure of 90/49 this morning.   -Diuretics discontinued.   -Supportive care.   -PCCM following.    3.  Acute on chronic hypoxic respiratory failure in the setting of COPD likely secondary to aspiration pneumonia and probable volume overload/acute diastolic CHF exacerbation -Likely secondary to pneumonia/aspiration pneumonia in the setting of possible volume overload. -Patient with use of accessory muscles of respiration the morning of 09/04/2021,  with complaints of shortness of breath and currently on 15 L high flow nasal cannula with sats of 97%. -Improved clinically since 09/04/2021 after trial of BiPAP, broadening of antibiotics. -BNP obtained at 942.6. -2D echo from 10/23/2020 with a EF of 55 to 78%,EUMP, grade 1 diastolic dysfunction, interventricular septum flattened in diastole consistent with right  ventricular volume overload, low normal right ventricular systolic function, right ventricular size normal. -Patient on home O2 with a  baseline of 3 L nasal cannula. -Blood pressure improved post IV albumin however blood pressure soft this morning. -Repeat chest x-ray from 09/04/2021 with increased confluent opacity over the right upper lobe with worsening variation throughout remainder of right lung concerning for worsening pneumonia with a persistent parapneumonic effusion.   -Repeat chest x-ray pending for this morning. -ABG with a pH of 7.394, PCO2 of 44, PO2 of 58.5. -Status post Lasix 20 mg IV x1 on 09/04/2021 with urine output of 800 cc recorded.   -Blood pressure soft this morning and patient with no significant volume overload on examination and as such we will hold off on any further diuresis and per cardiology recommendations.  -Strict I's and O's. -Currently on 15 L high flow nasal cannula with clinical improvement and looks comfortable on examination.   -BiPAP as needed.  -2D echo pending. -Continue scheduled Xopenex and Atrovent nebs, Pulmicort, Brovana, Claritin, PPI.   -IV antibiotics have been broadened to IV Zosyn which we will continue for now.   -PCCM following and appreciate their input and recommendations.   4.  Elevated troponin -Patient with acute on chronic respiratory distress on 09/04/2021, noted to have elevated BNP with concerns for volume overload.  Cardiac enzymes were elevated. -2D echo pending. -Patient seen in consultation by cardiology this point in time suspect no need for further evaluation given her comorbid illness and frailty. -Appreciate cardiology input and recommendations.  5.  Diarrhea -Patient had reported 5 days of diarrhea at home prior to admission. -No bowel movement since admission. -Enteric precautions discontinued.   6.  Paroxysmal atrial fibrillation -Amiodarone for rate control.   -Xarelto for anticoagulation.   -Cardiology following.  7.  Prolonged QTC -Patient noted to have a QTC of 560 on admission, chronically on amiodarone. -Repeat EKG with resolution of QT  prolongation currently at 437. -Keep potassium approximately at 4, magnesium approximately at 2.  8.  Chronic hyponatremia -Currently at baseline of 130.    9.  Chronic venous stasis of bilateral lower extremities/venous insufficiency with ulceration -Continue current wound treatment recommendations per WOC RN  10.  Hypomagnesemia/hypokalemia -Magnesium at 1.8, potassium at 4.4.   -Magnesium sulfate 2 g IV x1.   -Follow.    11.  Moderate protein calorie malnutrition -Patient cachectic and frail on examination. -Albumin level at 2.3 on admission. -Diet liberalized.  -Status post IV albumin every 6 hours x1 day.   -Continue Ensure nutritional supplementation.   -RD consulted.   12.  Goals of care -It is noted that palliative care was to follow-up with patient after recent discharge, patient is a DO NOT INTUBATE.  Patient does express desire for no CPR as she feels could break her ribs.  Patient is accepting of ACLS medications if needed.  We will change patient to a limited code. -Consult with palliative care for further evaluation and management/goals of care.           DVT prophylaxis: Xarelto Code Status: Partial code Family Communication: Updated patient.  No family at bedside. Disposition:   Status is: Inpatient  Remains inpatient appropriate because:Inpatient level of care appropriate due to severity of illness  Dispo: The patient is from: Home              Anticipated d/c is to:  TBD              Patient currently  is not medically stable to d/c.   Difficult to place patient No       Consultants:  WOC Phineas Douglas, RN 09/02/2021 PCCM: Dr.Olalere 09/04/2021 Cardiology: Dr. Percival Spanish 09/04/2021  Procedures:  Chest x-ray 09/01/2021, 09/04/2021   Antimicrobials: IV Unasyn 09/01/2021>>>>> 09/04/2021 IV azithromycin 10/10 /2022x1 dose IV Rocephin 10/10/ 2022x1 dose IV Zosyn 09/04/2021>>>>>   Subjective: Sitting up in bed.  States she feels better than  she did yesterday.  Speaking in full sentences.  Denies any chest pain.  States cough is more productive.  Currently off BiPAP and on 15 L high flow nasal cannula with sats in the low 90s.  States right-sided pain with some improvement.  Objective: Vitals:   09/05/21 0700 09/05/21 0710 09/05/21 0716 09/05/21 0800  BP: (!) 76/43   (!) 90/49  Pulse: 83   96  Resp: 16   16  Temp:      TempSrc:      SpO2: 93% 93% 94% 90%  Weight:      Height:        Intake/Output Summary (Last 24 hours) at 09/05/2021 0938 Last data filed at 09/05/2021 0850 Gross per 24 hour  Intake 629.94 ml  Output 800 ml  Net -170.06 ml    Filed Weights   09/02/21 1000 09/02/21 1900 09/04/21 1000  Weight: 41.9 kg 41.9 kg 44 kg    Examination:  General exam: Frail.  Cachectic. Respiratory system: Improved breath sounds.  No crackles noted.  No significant rhonchorous breath sounds.  Some occasional coarse breath sounds on the right.  No use of accessory muscles of respiration.  Speaking in full sentences.   Cardiovascular system: Regular rate rhythm no murmurs rubs or gallops.  No JVD.  No lower extremity edema.  Gastrointestinal system: Abdomen is soft, nontender, nondistended, positive bowel sounds.  No rebound.  No guarding. Central nervous system: Alert and oriented.  Moving extremities spontaneously.  No focal neurological deficits.  Extremities: Symmetric 5 x 5 power. Skin: No rashes, lesions or ulcers Psychiatry: Judgement and insight appear normal. Mood & affect appropriate.     Data Reviewed: I have personally reviewed following labs and imaging studies  CBC: Recent Labs  Lab 09/01/21 1415 09/02/21 0500 09/03/21 0545 09/04/21 0234 09/05/21 0237  WBC 17.5* 13.3* 20.2* 20.0* 19.0*  NEUTROABS 14.9*  --   --   --  15.3*  HGB 11.1* 10.4* 10.2* 8.8* 8.8*  HCT 35.4* 32.6* 31.0* 27.3* 27.9*  MCV 91.2 90.3 87.8 89.8 91.8  PLT 501* 514* 567* 507* 517*     Basic Metabolic Panel: Recent Labs   Lab 09/01/21 1415 09/02/21 0500 09/03/21 0545 09/04/21 0234 09/05/21 0237  NA 131* 129* 130* 135 130*  K 4.1 2.9* 3.8 3.1* 4.4  CL 95* 96* 98 101 97*  CO2 _0 GLUCOSE 92 88 95 119* 110*  BUN _1 CREATININE 0.83 0.60 0.65 0.56 0.70  CALCIUM 7.9* 7.8* 7.8* 8.6* 8.1*  MG  --   --  1.1* 1.7 1.8     GFR: Estimated Creatinine Clearance: 41.6 mL/min (by C-G formula based on SCr of 0.7 mg/dL).  Liver Function Tests: Recent Labs  Lab 09/01/21 1415  AST 22  ALT 10  ALKPHOS 85  BILITOT 1.2  PROT 6.1*  ALBUMIN 2.3*     CBG: No results for input(s): GLUCAP in the last 168 hours.   Recent Results (from the past 240 hour(s))  Blood culture (routine  single)     Status: None (Preliminary result)   Collection Time: 09/01/21  2:15 PM   Specimen: BLOOD  Result Value Ref Range Status   Specimen Description   Final    BLOOD LEFT ANTECUBITAL Performed at [place] [address] [place], [place] [address]    Special Requests   Final    BOTTLES DRAWN AEROBIC AND ANAEROBIC Blood Culture adequate volume Performed at [place] [address], [place], [place] [address]    Culture   Final    NO GROWTH 4 DAYS Performed at [facility], [place] [address], [place], [place] [address]    Report Status PENDING  Incomplete  Resp Panel by RT-PCR (Flu A&B, Covid)     Status: None   Collection Time: 09/01/21  2:20 PM   Specimen: Nasopharyngeal(NP) swabs in vial transport medium  Result Value Ref Range Status   SARS Coronavirus 2 by RT PCR NEGATIVE NEGATIVE Final    Comment: (NOTE) SARS-CoV-2 target nucleic acids are NOT DETECTED.  The SARS-CoV-2 RNA is generally detectable in upper respiratory specimens during the acute phase of infection. The lowest concentration of SARS-CoV-2 viral copies this assay can detect is 138 copies/mL. A negative result does not preclude SARS-Cov-2 infection and should not be used as  the sole basis for treatment or other patient management decisions. A negative result may occur with  improper specimen collection/handling, submission of specimen other than nasopharyngeal swab, presence of viral mutation(s) within the areas targeted by this assay, and inadequate number of viral copies(<138 copies/mL). A negative result must be combined with clinical observations, patient history, and epidemiological information. The expected result is Negative.  Fact Sheet for Patients:  EntrepreneurPulse.com.au  Fact Sheet for Healthcare Providers:  IncredibleEmployment.be  This test is no t yet approved or cleared by the [place] FDA and  has been authorized for detection and/or diagnosis of SARS-CoV-2 by FDA under an Emergency Use Authorization (EUA). This EUA will remain  in effect (meaning this test can be used) for the duration of the COVID-19 declaration under Section 564(b)(1) of the Act, 21 U.S.C.section 360bbb-3(b)(1), unless the authorization is terminated  or revoked sooner.       Influenza A by PCR NEGATIVE NEGATIVE Final   Influenza B by PCR NEGATIVE NEGATIVE Final    Comment: (NOTE) The Xpert Xpress SARS-CoV-2/FLU/RSV plus assay is intended as an aid in the diagnosis of influenza from Nasopharyngeal swab specimens and should not be used as a sole basis for treatment. Nasal washings and aspirates are unacceptable for Xpert Xpress SARS-CoV-2/FLU/RSV testing.  Fact Sheet for Patients: EntrepreneurPulse.com.au  Fact Sheet for Healthcare Providers: IncredibleEmployment.be  This test is not yet approved or cleared by the [place] FDA and has been authorized for detection and/or diagnosis of SARS-CoV-2 by FDA under an Emergency Use Authorization (EUA). This EUA will remain in effect (meaning this test can be used) for the duration of the COVID-19 declaration under Section 564(b)(1) of  the Act, 21 U.S.C. section 360bbb-3(b)(1), unless the authorization is terminated or revoked.  Performed at [facility], [place] [address] [place], [place] [address]   Urine Culture     Status: Abnormal   Collection Time: 09/01/21  8:00 PM   Specimen: In/Out Cath Urine  Result Value Ref Range Status   Specimen Description   Final    IN/OUT CATH URINE Performed at [place] [address] [place],  [address]  Special Requests   Final    NONE Performed at [facility], [place] [address], [place], [place] [address]    Culture MULTIPLE SPECIES PRESENT, SUGGEST RECOLLECTION (A)  Final   Report Status 09/02/2021 FINAL  Final  MRSA Next Gen by PCR, Nasal     Status: None   Collection Time: 09/02/21 11:34 PM   Specimen: Nasal Mucosa; Nasal Swab  Result Value Ref Range Status   MRSA by PCR Next Gen NOT DETECTED NOT DETECTED Final    Comment: (NOTE) The GeneXpert MRSA Assay (FDA approved for NASAL specimens only), is one component of a comprehensive MRSA colonization surveillance program. It is not intended to diagnose MRSA infection nor to guide or monitor treatment for MRSA infections. Test performance is not FDA approved in patients less than 40 years old. Performed at [facility], [place] [address], [place], [place] [address]           Radiology Studies: DG CHEST PORT 1 VIEW  Result Date: 09/04/2021 CLINICAL DATA:  Hypoxia EXAM: PORTABLE CHEST 1 VIEW COMPARISON:  Chest radiograph 09/01/2021 FINDINGS: The cardiomediastinal silhouette is grossly stable. Again seen is confluent opacity in the right upper lobe, worsened since 09/01/2021. Aeration throughout the remainder of the right lung has also worsened in the interim. There is a small right pleural effusion. Aeration of the left lung is not significantly changed. There is no left effusion. No pneumothorax is seen. The bones are stable.  IMPRESSION: Increased confluent opacity projecting over the right upper lobe with worsened aeration throughout the remainder of the right lung. Findings concerning for worsening pneumonia with a persistent parapneumonic effusion. As before, follow-up radiographs following appropriate treatment are recommended to ensure resolution. Electronically Signed   By: Valetta Mole M.D.   On: 09/04/2021 11:17        Scheduled Meds:  (feeding supplement) PROSource Plus  30 mL Oral BID BM   amiodarone  200 mg Oral Daily   arformoterol  15 mcg Nebulization BID   budesonide (PULMICORT) nebulizer solution  0.5 mg Nebulization BID   busPIRone  10 mg Oral Daily   Chlorhexidine Gluconate Cloth  6 each Topical Daily   feeding supplement  237 mL Oral BID BM   ipratropium  0.5 mg Nebulization Q6H   levalbuterol  0.63 mg Nebulization Q6H   loratadine  10 mg Oral Daily   mouth rinse  15 mL Mouth Rinse BID   multivitamin with minerals  1 tablet Oral Daily   pantoprazole  40 mg Oral Q0600   Rivaroxaban  15 mg Oral Q breakfast   Continuous Infusions:  magnesium sulfate bolus IVPB     piperacillin-tazobactam (ZOSYN)  IV Stopped (09/05/21 0910)     LOS: 3 days    Time spent: 40 minutes    Irine Seal, MD [facility]   To contact the attending provider between 7A-7P or the covering provider during after hours 7P-7A, please log into the web site [email or web address] and access using universal [place] password for that web site. If you do not have the password, please call the hospital operator.  09/05/2021, 9:38 AM

## 2021-09-05 NOTE — Progress Notes (Signed)
Dr. Percival Spanish had requested f/u on echo done earlier. Reviewed report with DOD Dr. Sallyanne Kuster given RV changes, felt likely due to current clinical context, no acute change in plan needed - also note prior hx of RV volume overload per 10/2020 echo.

## 2021-09-05 NOTE — Progress Notes (Signed)
Occupational Therapy Treatment Patient Details Name: Kristen [name] MRN: [id number] DOB: [birth year] Today's Date: 09/05/2021   History of present illness Pt is 75 yo female who presented to [facility] ED from home by EMS due to ongoing diarrhea x 5 days, productive cough with associated right-sided chest pain and chills, with progressive generalized weakness. Medical history significant of COPD with chronic hypoxic respiratory failure on 3 L/min oxygen at baseline, chronic A-fib, venous insufficiency, hx of transient hypotension, hospital admission in July of this year for aspiration pneumonia and acute on chronic hypoxic respiratory failure.   OT comments  Pt limited by DOE 3/4 this session. Pt eager to get up to [facility]. Able to pivot EOB <>BSC with +2 mod HHA. Extra time and cues for breathing technique and rest breaks. Struggled with fluctuating SpO2, lowest reliably observed at 75, mostly hovered in the mid to upper 80s. Pt on 15 L HFNC. Pt tolerated about 2 minutes of toileting activity then reporting feeling poorly and needing back to bed. Nurse notified of SpO2 and present towards end of session. D/c plan remains appropriate for now though may need to consider SNF for ST rehab prior to home.    Recommendations for follow up therapy are one component of a multi-disciplinary discharge planning process, led by the attending physician.  Recommendations may be updated based on patient status, additional functional criteria and insurance authorization.    Follow Up Recommendations  Home health OT;Supervision/Assistance - 24 hour    Equipment Recommendations  Hospital bed    Recommendations for Other Services      Precautions / Restrictions Precautions Precautions: Fall Precaution Comments: monitor O2 and HR Restrictions Weight Bearing Restrictions: No       Mobility Bed Mobility Overal bed mobility: Needs Assistance Bed Mobility: Supine to Sit     Supine to sit: Mod assist;HOB elevated      General bed mobility comments: assist to powerup trunk and pivot hips to full EOB position    Transfers Overall transfer level: Needs assistance Equipment used: 2 person hand held assist Transfers: Stand Pivot Transfers;Squat Pivot Transfers   Stand pivot transfers: Mod assist;+2 physical assistance Squat pivot transfers: Mod assist;+2 physical assistance     General transfer comment: difficulty coming to full standing position completed more as high squat pivot then true stand-pivot. SpO2 closely monitored.    Balance Overall balance assessment: Needs assistance Sitting-balance support: Bilateral upper extremity supported;Feet supported Sitting balance-Leahy Scale: Fair Sitting balance - Comments: Less tremulous sitting EOB compared to last OT session 2 days ago. Generalized weakness and poor activity tolerance remain. Able to perform dynamic sitting activity on BSC at min guard level   Standing balance support: Bilateral upper extremity supported Standing balance-Leahy Scale: Poor Standing balance comment: difficulty coming to full upright position. +2 HHA mod A                           ADL either performed or assessed with clinical judgement   ADL Overall ADL's : Needs assistance/impaired                         Toilet Transfer: Moderate assistance;+2 for physical assistance;Squat-pivot;BSC Toilet Transfer Details (indicate cue type and reason): +2 HHA mod A to pivot to EOB<>BSC. Pt tolerated about 2 minutes of toileting activity then requested back to bed.           General ADL Comments: Pt completed bed mobility,  sat EOB several minutes d/t decrease in SpO2 to low 80s. with time, cues for breathing technique and to limit activity as much as possible (ie try not to talk) able to stabilize to around 90. Struggled with fluctuating SpO2, lowest reliably observed at 75. Pt on 15 L HFNC     Vision       Perception     Praxis      Cognition  Arousal/Alertness: Awake/alert Behavior During Therapy: WFL for tasks assessed/performed Overall Cognitive Status: Within Functional Limits for tasks assessed                                          Exercises     Shoulder Instructions       General Comments Pt on 15L HFNC with SpO2 75-91 with activity. Pt very motivated to get onto [facility]. Fatigued, DOE 3/4 and needing back to bed at end of session.    Pertinent Vitals/ Pain       Pain Assessment: Faces Faces Pain Scale: Hurts little more Pain Location: unspecified Pain Descriptors / Indicators: Discomfort Pain Intervention(s): Monitored during session;Limited activity within patient's tolerance;Repositioned  Home Living                                          Prior Functioning/Environment              Frequency  Min 2X/week        Progress Toward Goals  OT Goals(current goals can now be found in the care plan section)  Progress towards OT goals: Progressing toward goals (limited progress)  Acute Rehab OT Goals Patient Stated Goal: to  breathe better, go home OT Goal Formulation: With patient Time For Goal Achievement: 09/17/21 Potential to Achieve Goals: Good ADL Goals Pt Will Perform Grooming: with min assist;sitting Pt Will Perform Upper Body Bathing: with min assist;sitting Pt Will Perform Lower Body Bathing: with mod assist;sit to/from stand;sitting/lateral leans Pt Will Transfer to Toilet: with mod assist;stand pivot transfer Pt Will Perform Toileting - Clothing Manipulation and hygiene: with mod assist;sitting/lateral leans Additional ADL Goal #1: Pt will complete bed mobility at min guard level to prepare for EOB/OOB ADLs.  Plan Discharge plan remains appropriate    Co-evaluation                 AM-PAC OT "6 Clicks" Daily Activity     Outcome Measure   Help from another person eating meals?: A Little Help from another person taking care of personal  grooming?: A Lot Help from another person toileting, which includes using toliet, bedpan, or urinal?: Total Help from another person bathing (including washing, rinsing, drying)?: A Lot Help from another person to put on and taking off regular upper body clothing?: A Little Help from another person to put on and taking off regular lower body clothing?: A Lot 6 Click Score: 13    End of Session Equipment Utilized During Treatment: Oxygen (15 L HFNC)  OT Visit Diagnosis: Unsteadiness on feet (R26.81);Pain;Muscle weakness (generalized) (M62.81)   Activity Tolerance Patient limited by fatigue;Other (comment) (DOE 3/4)   Patient Left in bed;with call bell/phone within reach   Nurse Communication Mobility status;Other (comment) (O2 sats, nurse in room at end of session)        Time:  3779-3968 OT Time Calculation (min): 32 min  Charges: OT General Charges $OT Visit: 1 Visit OT Treatments $Self Care/Home Management : 23-37 mins  Tyrone Schimke, OT Acute Rehabilitation Services Pager: [phone] Office: [phone]   Hortencia Pilar 09/05/2021, 2:00 PM

## 2021-09-05 NOTE — Progress Notes (Signed)
Progress Note  Patient Name: Kristen [name] Date of Encounter: 09/05/2021  Primary Cardiologist:   Freada Bergeron, MD   Subjective   She looks better today.  No diarrhea.  Breathing OK on high flow O2.  Denies pain.   Inpatient Medications    Scheduled Meds:  (feeding supplement) PROSource Plus  30 mL Oral BID BM   amiodarone  200 mg Oral Daily   arformoterol  15 mcg Nebulization BID   budesonide (PULMICORT) nebulizer solution  0.5 mg Nebulization BID   busPIRone  10 mg Oral Daily   Chlorhexidine Gluconate Cloth  6 each Topical Daily   feeding supplement  237 mL Oral BID BM   ipratropium  0.5 mg Nebulization Q6H   levalbuterol  0.63 mg Nebulization Q6H   loratadine  10 mg Oral Daily   mouth rinse  15 mL Mouth Rinse BID   multivitamin with minerals  1 tablet Oral Daily   pantoprazole  40 mg Oral Q0600   Rivaroxaban  15 mg Oral Q breakfast   Continuous Infusions:  piperacillin-tazobactam (ZOSYN)  IV 3.375 g (09/05/21 0510)   PRN Meds: acetaminophen **OR** acetaminophen, fluticasone, levalbuterol, mirtazapine, ondansetron **OR** ondansetron (ZOFRAN) IV, oxyCODONE-acetaminophen **AND** oxyCODONE, senna-docusate   Vital Signs    Vitals:   09/05/21 0300 09/05/21 0355 09/05/21 0710 09/05/21 0716  BP: 109/62     Pulse: 92     Resp: 15     Temp:  98 F (36.7 C)    TempSrc:  Oral    SpO2: 94%  93% 94%  Weight:      Height:        Intake/Output Summary (Last 24 hours) at 09/05/2021 0731 Last data filed at 09/04/2021 1833 Gross per 24 hour  Intake 231.6 ml  Output 800 ml  Net -568.4 ml   Filed Weights   09/02/21 1000 09/02/21 1900 09/04/21 1000  Weight: 41.9 kg 41.9 kg 44 kg    Telemetry    NSR, sinus tach - Personally Reviewed  ECG    NA - Personally Reviewed  Physical Exam   GEN: No acute distress.  Very frail Neck: No  JVD Cardiac: RRR, no murmurs, rubs, or gallops.  Respiratory:  Decreased breath sounds bilaterally but right greater than left  with coarse crackles.  GI: Soft, nontender, non-distended  MS: No  edema; No deformity. Neuro:  Nonfocal  Psych: Normal affect   Labs    Chemistry Recent Labs  Lab 09/01/21 1415 09/02/21 0500 09/03/21 0545 09/04/21 0234 09/05/21 0237  NA 131*   < > 130* 135 130*  K 4.1   < > 3.8 3.1* 4.4  CL 95*   < > 98 101 97*  CO2 30   < > _0 GLUCOSE 92   < > 95 119* 110*  BUN 20   < > _1 CREATININE 0.83   < > 0.65 0.56 0.70  CALCIUM 7.9*   < > 7.8* 8.6* 8.1*  PROT 6.1*  --   --   --   --   ALBUMIN 2.3*  --   --   --   --   AST 22  --   --   --   --   ALT 10  --   --   --   --   ALKPHOS 85  --   --   --   --   BILITOT 1.2  --   --   --   --  GFRNONAA >60   < > >60 >60 >60  ANIONGAP 6   < > _0 < > = values in this interval not displayed.     Hematology Recent Labs  Lab 09/03/21 0545 09/04/21 0234 09/05/21 0237  WBC 20.2* 20.0* 19.0*  RBC 3.53* 3.04* 3.04*  HGB 10.2* 8.8* 8.8*  HCT 31.0* 27.3* 27.9*  MCV 87.8 89.8 91.8  MCH 28.9 28.9 28.9  MCHC 32.9 32.2 31.5  RDW 14.1 14.5 14.6  PLT 567* 507* 517*    Cardiac EnzymesNo results for input(s): TROPONINI in the last 168 hours. No results for input(s): TROPIPOC in the last 168 hours.   BNP Recent Labs  Lab 09/04/21 0234  BNP 942.6*     DDimer No results for input(s): DDIMER in the last 168 hours.   Radiology    DG CHEST PORT 1 VIEW  Result Date: 09/04/2021 CLINICAL DATA:  Hypoxia EXAM: PORTABLE CHEST 1 VIEW COMPARISON:  Chest radiograph 09/01/2021 FINDINGS: The cardiomediastinal silhouette is grossly stable. Again seen is confluent opacity in the right upper lobe, worsened since 09/01/2021. Aeration throughout the remainder of the right lung has also worsened in the interim. There is a small right pleural effusion. Aeration of the left lung is not significantly changed. There is no left effusion. No pneumothorax is seen. The bones are stable. IMPRESSION: Increased confluent opacity projecting over  the right upper lobe with worsened aeration throughout the remainder of the right lung. Findings concerning for worsening pneumonia with a persistent parapneumonic effusion. As before, follow-up radiographs following appropriate treatment are recommended to ensure resolution. Electronically Signed   By: Valetta Mole M.D.   On: 09/04/2021 11:17    Cardiac Studies   Echo pending  Patient Profile     75 y.o. female who has has a past history of paroxysmal atrial fibrillation diagnosed in January.  She has a history of COPD and home O2.   We are called with an elevated troponin.  She was admitted with respiratory failure and diarrhea.   Assessment & Plan    Elevated troponin:  Echo pending.  Suspect no need for further evaluation given her comorbid illness and frailty.    Hypotension:  She was hypotensive responding to fluids and then had diuresis.   Her CXR is pneumonia with parapneumonic effusion.  Its not clear that she has volume overloaded and given the hypotension I would not suggest further diuresis today.  PAF: Maintaining sinus rhythm.  I will continue Xarelto and p.o. amiodarone.       For questions or updates, please contact [place] Please consult [email or web address] for contact info under Cardiology/STEMI.   Signed, Minus Breeding, MD  09/05/2021, 7:31 AM

## 2021-09-05 NOTE — Progress Notes (Signed)
  Echocardiogram 2D Echocardiogram has been performed.  Bobbye Charleston 09/05/2021, 3:09 PM

## 2021-09-06 DIAGNOSIS — J189 Pneumonia, unspecified organism: Secondary | ICD-10-CM | POA: Diagnosis not present

## 2021-09-06 DIAGNOSIS — I480 Paroxysmal atrial fibrillation: Secondary | ICD-10-CM | POA: Diagnosis not present

## 2021-09-06 DIAGNOSIS — J9601 Acute respiratory failure with hypoxia: Secondary | ICD-10-CM | POA: Diagnosis not present

## 2021-09-06 DIAGNOSIS — J9611 Chronic respiratory failure with hypoxia: Secondary | ICD-10-CM | POA: Diagnosis not present

## 2021-09-06 DIAGNOSIS — J690 Pneumonitis due to inhalation of food and vomit: Secondary | ICD-10-CM | POA: Diagnosis not present

## 2021-09-06 DIAGNOSIS — A419 Sepsis, unspecified organism: Secondary | ICD-10-CM | POA: Diagnosis not present

## 2021-09-06 LAB — CBC WITH DIFFERENTIAL/PLATELET
Abs Immature Granulocytes: 0.23 10*3/uL — ABNORMAL HIGH (ref 0.00–0.07)
Basophils Absolute: 0 10*3/uL (ref 0.0–0.1)
Basophils Relative: 0 %
Eosinophils Absolute: 0.3 10*3/uL (ref 0.0–0.5)
Eosinophils Relative: 2 %
HCT: 25.7 % — ABNORMAL LOW (ref 36.0–46.0)
Hemoglobin: 8.3 g/dL — ABNORMAL LOW (ref 12.0–15.0)
Immature Granulocytes: 1 %
Lymphocytes Relative: 11 %
Lymphs Abs: 1.9 10*3/uL (ref 0.7–4.0)
MCH: 28.3 pg (ref 26.0–34.0)
MCHC: 32.3 g/dL (ref 30.0–36.0)
MCV: 87.7 fL (ref 80.0–100.0)
Monocytes Absolute: 1.5 10*3/uL — ABNORMAL HIGH (ref 0.1–1.0)
Monocytes Relative: 9 %
Neutro Abs: 13.3 10*3/uL — ABNORMAL HIGH (ref 1.7–7.7)
Neutrophils Relative %: 77 %
Platelets: 554 10*3/uL — ABNORMAL HIGH (ref 150–400)
RBC: 2.93 MIL/uL — ABNORMAL LOW (ref 3.87–5.11)
RDW: 14.5 % (ref 11.5–15.5)
WBC: 17.3 10*3/uL — ABNORMAL HIGH (ref 4.0–10.5)
nRBC: 0 % (ref 0.0–0.2)

## 2021-09-06 LAB — RENAL FUNCTION PANEL
Albumin: 2.1 g/dL — ABNORMAL LOW (ref 3.5–5.0)
Anion gap: 6 (ref 5–15)
BUN: 15 mg/dL (ref 8–23)
CO2: 29 mmol/L (ref 22–32)
Calcium: 8.1 mg/dL — ABNORMAL LOW (ref 8.9–10.3)
Chloride: 97 mmol/L — ABNORMAL LOW (ref 98–111)
Creatinine, Ser: 0.61 mg/dL (ref 0.44–1.00)
GFR, Estimated: >60 mL/min (ref >60)
Glucose, Bld: 97 mg/dL (ref 70–99)
Phosphorus: 2.5 mg/dL (ref 2.5–4.6)
Potassium: 4.4 mmol/L (ref 3.5–5.1)
Sodium: 132 mmol/L — ABNORMAL LOW (ref 135–145)

## 2021-09-06 LAB — CULTURE, BLOOD (SINGLE)
Culture: NO GROWTH
Special Requests: ADEQUATE

## 2021-09-06 LAB — MAGNESIUM: Magnesium: 1.7 mg/dL (ref 1.7–2.4)

## 2021-09-06 MED ORDER — MAGNESIUM SULFATE 4 GM/100ML IV SOLN
4.0000 g | Freq: Once | INTRAVENOUS | Status: AC
  Administered 2021-09-06: 4 g via INTRAVENOUS
  Filled 2021-09-06: qty 100

## 2021-09-06 MED ORDER — POLYETHYLENE GLYCOL 3350 17 G PO PACK
17.0000 g | PACK | Freq: Two times a day (BID) | ORAL | Status: DC
  Administered 2021-09-06 – 2021-09-15 (×11): 17 g via ORAL
  Filled 2021-09-06 (×15): qty 1

## 2021-09-06 MED ORDER — BISACODYL 10 MG RE SUPP
10.0000 mg | Freq: Every day | RECTAL | Status: DC
  Administered 2021-09-06 – 2021-09-13 (×3): 10 mg via RECTAL
  Filled 2021-09-06 (×6): qty 1

## 2021-09-06 NOTE — Plan of Care (Signed)

## 2021-09-06 NOTE — Progress Notes (Signed)
PROGRESS NOTE    Kristen [name]  OZH:[id number] DOB: [birth year] DOA: 09/01/2021 PCP: [name], MD   No chief complaint on file.   Brief Narrative:  Kristen [name] is a 75 y.o. female with medical history significant of COPD with chronic hypoxic respiratory failure on 3 L/min oxygen at baseline, chronic A-fib, venous insufficiency, hx of transient hypotension, hospital admission in July of this year for aspiration pneumonia and acute on chronic hypoxic respiratory failure.  She was brought to [facility] ED from home by EMS due to ongoing diarrhea x 5 days, productive cough with associated right-sided chest pain and chills, with progressive generalized weakness.  Denies abdominal pain, nausea, vomiting, dysuria, focal weakness numbness or tingling, headaches or other recent symptoms.  Reports increasing her oxygen briefly to 3.5 L/min mostly due to feeling anxious.  Otherwise has been on her usual 3 L/min. Chest xray showed new extensive consolidation in the lateral right middle and lower lung, consistent with PNA, small right pleural effusion and chronic hyperexpansion compatible with emphysema. Patient is followed by the IM residency service at [facility], but this states she would prefer to be admitted here to [facility] this time.   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 -On admission patient noted to have met criteria for sepsis with leukocytosis, tachypnea, pneumonia. -Patient with a rising leukocytosis however started to slowly trend back down with broadening of IV antibiotic coverage..   -White count today at 17.3 from 19.0 from 20.  -Patient with increased O2 requirements on 8-9 L high flow nasal cannula with sats in the low 90s.   -Blood cultures pending with no growth to date x5 days, MRSA PCR negative.  Urine cultures with multiple species. -Sputum gram stain and culture with no organisms seen. -Some clinical improvement in terms of respiratory status today from yesterday. -IV antibiotics broadened from Unasyn to IV Zosyn which we will continue for now. -Continue Brovana, Pulmicort, scheduled Xopenex and Atrovent nebs, Mucinex -Supportive care.  2.  Hypotension -Patient with systolic blood pressures noted to be low in the 80s on 09/03/2021.   -Random cortisol 16.8 on 09/05/2021. -IV albumin every 6 hours x24 hours given with some improvement with blood pressure however blood pressure was soft on 09/05/2021 and patient became hypotensive now currently on pressors with Levophed.  -Diuretics discontinued.   -Supportive care.   -PCCM following.    3.  Acute on chronic hypoxic respiratory failure in the setting of COPD likely secondary to aspiration pneumonia and probable volume overload/acute diastolic CHF exacerbation/abnormal 2D echo -Likely secondary to pneumonia/aspiration pneumonia in the setting of possible volume overload. -Patient with use of accessory muscles of respiration the morning of 09/04/2021,  with complaints of shortness of breath and currently on 15 L high flow nasal cannula with sats of 97% on  09/05/2021. -Currently on 8-9 L high flow nasal cannula with sats in the low 90s. -Improved clinically since 09/04/2021 after trial of BiPAP,  broadening of antibiotics. -BNP obtained at 942.6. -2D echo from 10/23/2020 with a EF of 55 to 24%,OXBD, grade 1 diastolic dysfunction, interventricular septum flattened in diastole consistent with right ventricular volume overload, low normal right ventricular systolic function, right ventricular size normal. -2D echo from 09/05/2021 with EF of 50%, left ventricle with wall motion abnormalities, left ventricular diastolic parameters indeterminate, interventricular septum is flattened in systole consistent with right ventricular pressure overload, mildly reduced right ventricular systolic function, right ventricular size is moderately to severely enlarged, moderately elevated pulmonary artery systolic pressure, right atrial size moderately dilated. -Patient on home O2 with a baseline of 3 L nasal cannula. -Blood pressure improved post IV albumin however patient remained hypotensive on 09/05/2021 requiring placement on pressors/Levophed.   -Repeat chest x-ray from 09/04/2021 with increased confluent opacity over the right upper lobe with worsening variation throughout remainder of right lung concerning for worsening pneumonia with a persistent parapneumonic effusion.   -Repeat chest x-ray from 09/05/2021 with multilobular infiltrate and small effusions, most likely pneumonia, mild treatment right upper lobe infiltrate.  -ABG with a pH of 7.394, PCO2 of 44, PO2 of 58.5. -Status post Lasix 20 mg IV x1 on 09/04/2021 with urine output of 800 cc recorded.   -Blood pressure soft and patient became hypotensive during the day on 09/05/2021 requiring pressor support with Levophed that she is still on.  -Diuretics discontinued. -Strict I's and O's. -Currently on 8-9 L high flow nasal cannula with clinical improvement and looks comfortable on examination.   -BiPAP as needed.  -Continue scheduled Xopenex and Atrovent nebs, Pulmicort, Brovana, Claritin, PPI.   -IV antibiotics have been broadened to IV Zosyn which  we will continue for now.   -Cardiology following. -PCCM following and appreciate their input and recommendations.   4.  Elevated troponin/abnormal 2D echo -Patient with acute on chronic respiratory distress on 09/04/2021, noted to have elevated BNP with concerns for volume overload.  Cardiac enzymes were elevated. -2D echo with EF of 50%, left ventricle with wall motion abnormalities, left ventricular diastolic parameters indeterminate, interventricular septum is flattened in systole consistent with right ventricular pressure overload, mildly reduced right ventricular systolic function, right ventricular size is moderately to severely enlarged, moderately elevated pulmonary artery systolic pressure, right atrial size moderately dilated. -Patient seen in consultation by cardiology this point in time suspect no need for further evaluation given her comorbid illness and frailty. -Patient noted to be hypotensive currently on pressors and as such we will hold off on starting a beta-blocker. -Per cardiology.  5.  Diarrhea>>>.  Constipation -Patient had reported 5 days of diarrhea at home prior to admission. -No bowel movement since admission. -Patient with complaints of constipation. -Received a dose of sorbitol yesterday with no results. -Place on Dulcolax suppository daily, MiraLAX twice daily. -Enteric precautions have been discontinued.  6.  Paroxysmal atrial fibrillation -Amiodarone for rate control.   -Xarelto for anticoagulation.   -Cardiology following.  7.  Prolonged QTC -Patient noted to have a QTC of 560 on admission, chronically on amiodarone. -Repeat EKG with resolution of QT prolongation currently at 437. -Keep potassium approximately at 4, magnesium approximately at 2.  8.  Chronic hyponatremia -Currently close to baseline at 132.    9.  Chronic venous stasis of bilateral lower extremities/venous insufficiency with ulceration -Continue current wound treatment  recommendations per WOC RN  10.  Hypomagnesemia/hypokalemia -Magnesium at 1.7, potassium at 4.4.   -Magnesium sulfate 2 g IV x1.   -Follow.    11.  Moderate protein calorie malnutrition -Patient cachectic and frail on examination. -Albumin level at 2.3 on admission. -Diet liberalized.  -Status post IV albumin every 6 hours x1 day.   -Albumin noted at 2.1. -Continue Ensure nutritional supplementation.   -RD consulted.   12.  Goals of care -It is noted that palliative care was to follow-up with patient after recent discharge, patient is a DO NOT INTUBATE.  Patient does express desire for no CPR as she feels could break her ribs.  Patient is accepting of ACLS medications if needed.  CODE STATUS has been changed to limited code.   -Palliative care consultation pending.         DVT prophylaxis: Xarelto Code Status: Partial code Family Communication: Updated patient.  No family at bedside. Disposition:   Status is: Inpatient  Remains inpatient appropriate because:Inpatient level of care appropriate due to severity of illness  Dispo: The patient is from: Home              Anticipated d/c is to:  TBD              Patient currently is not medically stable to d/c.   Difficult to place patient No       Consultants:  WOC Phineas Douglas, RN 09/02/2021 PCCM: Dr.Olalere 09/04/2021 Cardiology: Dr. Percival Spanish 09/04/2021  Procedures:  Chest x-ray 09/01/2021, 09/04/2021 2D echo 09/05/2021   Antimicrobials: IV Unasyn 09/01/2021>>>>> 09/04/2021 IV azithromycin 10/10 /2022x1 dose IV Rocephin 10/10/ 2022x1 dose IV Zosyn 09/04/2021>>>>>   Subjective: Patient sitting up in bed has eating about 90% of her breakfast.  Stated she slept well last night.  Denies any chest pain.  Feels shortness of breath is improved over the past 24 hours.  Complain of constipation.  Currently on 8 to 9 L high flow nasal cannula with sats in the low 90s.  On pressors with systolic blood pressure in the low  100s.  Objective: Vitals:   09/06/21 0700 09/06/21 0758 09/06/21 0800 09/06/21 0814  BP: 120/68  126/76   Pulse: 89 89 92   Resp: _0 Temp:    98.3 F (36.8 C)  TempSrc:    Oral  SpO2: 93% 92% (!) 89%   Weight:      Height:        Intake/Output Summary (Last 24 hours) at 09/06/2021 0907 Last data filed at 09/06/2021 0800 Gross per 24 hour  Intake 1238.06 ml  Output 150 ml  Net 1088.06 ml    Filed Weights   09/02/21 1000 09/02/21 1900 09/04/21 1000  Weight: 41.9 kg 41.9 kg 44 kg    Examination:  General exam: Frail.  Cachectic. Respiratory system: No crackles noted.  Some scattered coarse breath sounds on the right.  Improved air movement.  No significant rhonchi noted.  Minimal use of accessory muscles of respiration.  Speaking in full sentences.   Cardiovascular system: RRR no murmurs rubs or gallops.  No JVD.  No lower extremity edema.  Gastrointestinal system: Abdomen is soft, mildly distended, nontender, positive bowel sounds.  No rebound.  No guarding. Central nervous system: Alert and oriented.  Moving extremities spontaneously.  No focal neurological deficits.  Extremities: Symmetric 5 x 5 power. Skin: No rashes, lesions or ulcers Psychiatry: Judgement and insight appear normal. Mood & affect appropriate.     Data Reviewed: I have personally reviewed  following labs and imaging studies  CBC: Recent Labs  Lab 09/01/21 1415 09/02/21 0500 09/03/21 0545 09/04/21 0234 09/05/21 0237 09/06/21 0237  WBC 17.5* 13.3* 20.2* 20.0* 19.0* 17.3*  NEUTROABS 14.9*  --   --   --  15.3* 13.3*  HGB 11.1* 10.4* 10.2* 8.8* 8.8* 8.3*  HCT 35.4* 32.6* 31.0* 27.3* 27.9* 25.7*  MCV 91.2 90.3 87.8 89.8 91.8 87.7  PLT 501* 514* 567* 507* 517* 554*     Basic Metabolic Panel: Recent Labs  Lab 09/02/21 0500 09/03/21 0545 09/04/21 0234 09/05/21 0237 09/06/21 0237  NA 129* 130* 135 130* 132*  K 2.9* 3.8 3.1* 4.4 4.4  CL 96* 98 101 97* 97*  CO2 _0 GLUCOSE 88 95 119* 110* 97  BUN _1 CREATININE 0.60 0.65 0.56 0.70 0.61  CALCIUM 7.8* 7.8* 8.6* 8.1* 8.1*  MG  --  1.1* 1.7 1.8 1.7  PHOS  --   --   --   --  2.5     GFR: Estimated Creatinine Clearance: 41.6 mL/min (by C-G formula based on SCr of 0.61 mg/dL).  Liver Function Tests: Recent Labs  Lab 09/01/21 1415 09/06/21 0237  AST 22  --   ALT 10  --   ALKPHOS 85  --   BILITOT 1.2  --   PROT 6.1*  --   ALBUMIN 2.3* 2.1*     CBG: No results for input(s): GLUCAP in the last 168 hours.   Recent Results (from the past 240 hour(s))  Blood culture (routine single)     Status: None   Collection Time: 09/01/21  2:15 PM   Specimen: BLOOD  Result Value Ref Range Status   Specimen Description   Final    BLOOD LEFT ANTECUBITAL Performed at [facility] [address], [place], [place] [address]    Special Requests   Final    BOTTLES DRAWN AEROBIC AND ANAEROBIC Blood Culture adequate volume Performed at [facility] [address] [place], [place] [address]    Culture   Final    NO GROWTH 5 DAYS Performed at [facility], [place] [address], [place], [place] [address]    Report Status 09/06/2021 FINAL  Final  Resp Panel by RT-PCR (Flu A&B, Covid)     Status: None   Collection Time: 09/01/21  2:20 PM   Specimen: Nasopharyngeal(NP) swabs in vial transport medium  Result Value Ref Range Status   SARS Coronavirus 2 by RT PCR NEGATIVE NEGATIVE Final    Comment: (NOTE) SARS-CoV-2 target nucleic acids are NOT DETECTED.  The SARS-CoV-2 RNA is generally detectable in upper respiratory specimens during the acute phase of infection. The lowest concentration of SARS-CoV-2 viral copies this assay can detect is 138 copies/mL. A negative result does not preclude SARS-Cov-2 infection and should not be used as the sole basis for treatment or other patient management decisions. A negative result may occur with  improper specimen  collection/handling, submission of specimen other than nasopharyngeal swab, presence of viral mutation(s) within the areas targeted by this assay, and inadequate number of viral copies(<138 copies/mL). A negative result must be combined with clinical observations, patient history, and epidemiological information. The expected result is Negative.  Fact Sheet for Patients:  EntrepreneurPulse.com.au  Fact Sheet for Healthcare Providers:  IncredibleEmployment.be  This test is no t yet approved or cleared by the [place] FDA and  has been authorized for detection and/or  diagnosis of SARS-CoV-2 by FDA under an Emergency Use Authorization (EUA). This EUA will remain  in effect (meaning this test can be used) for the duration of the COVID-19 declaration under Section 564(b)(1) of the Act, 21 U.S.C.section 360bbb-3(b)(1), unless the authorization is terminated  or revoked sooner.       Influenza A by PCR NEGATIVE NEGATIVE Final   Influenza B by PCR NEGATIVE NEGATIVE Final    Comment: (NOTE) The Xpert Xpress SARS-CoV-2/FLU/RSV plus assay is intended as an aid in the diagnosis of influenza from Nasopharyngeal swab specimens and should not be used as a sole basis for treatment. Nasal washings and aspirates are unacceptable for Xpert Xpress SARS-CoV-2/FLU/RSV testing.  Fact Sheet for Patients: EntrepreneurPulse.com.au  Fact Sheet for Healthcare Providers: IncredibleEmployment.be  This test is not yet approved or cleared by the [place] FDA and has been authorized for detection and/or diagnosis of SARS-CoV-2 by FDA under an Emergency Use Authorization (EUA). This EUA will remain in effect (meaning this test can be used) for the duration of the COVID-19 declaration under Section 564(b)(1) of the Act, 21 U.S.C. section 360bbb-3(b)(1), unless the authorization is terminated or revoked.  Performed at [facility], [place] [address], [place], [place] [address]   Urine Culture     Status: Abnormal   Collection Time: 09/01/21  8:00 PM   Specimen: In/Out Cath Urine  Result Value Ref Range Status   Specimen Description   Final    IN/OUT CATH URINE Performed at [facility] [address], [place], [place] [address]    Special Requests   Final    NONE Performed at [facility], [place] [address], [place], [place] [address]    Culture MULTIPLE SPECIES PRESENT, SUGGEST RECOLLECTION (A)  Final   Report Status 09/02/2021 FINAL  Final  MRSA Next Gen by PCR, Nasal     Status: None   Collection Time: 09/02/21 11:34 PM   Specimen: Nasal Mucosa; Nasal Swab  Result Value Ref Range Status   MRSA by PCR Next Gen NOT DETECTED NOT DETECTED Final    Comment: (NOTE) The GeneXpert MRSA Assay (FDA approved for NASAL specimens only), is one component of a comprehensive MRSA colonization surveillance program. It is not intended to diagnose MRSA infection nor to guide or monitor treatment for MRSA infections. Test performance is not FDA approved in patients less than 100 years old. Performed at [facility], [place] [address] [place], [place] [address]   Expectorated Sputum Assessment w Gram Stain, Rflx to Resp Cult     Status: None   Collection Time: 09/05/21 12:13 PM   Specimen: Sputum  Result Value Ref Range Status   Specimen Description SPU  Final   Special Requests NONE  Final   Sputum evaluation   Final    THIS SPECIMEN IS ACCEPTABLE FOR SPUTUM CULTURE Performed at [facility], [place] [address], [place], [place] [address]    Report Status 09/05/2021 FINAL  Final  Culture, Respiratory w Gram Stain     Status: None (Preliminary result)   Collection Time: 09/05/21 12:13 PM   Specimen: Sputum  Result Value Ref Range Status   Specimen Description   Final    SPU Performed at [facility]  [address], [place], [place] [address]    Special Requests   Final    NONE Reflexed from [id number] Performed at [facility], [place] [address] [place], [place] [address]  Gram Stain   Final    ABUNDANT WBC PRESENT,BOTH PMN AND MONONUCLEAR FEW SQUAMOUS EPITHELIAL CELLS PRESENT NO ORGANISMS SEEN Performed at [facility], [place] [address] [place], [place] [address]    Culture PENDING  Incomplete   Report Status PENDING  Incomplete          Radiology Studies: DG CHEST PORT 1 VIEW  Result Date: 09/05/2021 CLINICAL DATA:  Multilobar pneumonia EXAM: PORTABLE CHEST 1 VIEW COMPARISON:  09/04/2021 FINDINGS: The large area of infiltrate in the right upper lobe slightly improved. Bibasilar infiltrates unchanged. There is underlying COPD. Small bilateral pleural effusions unchanged. IMPRESSION: Multi lobar infiltrate and small effusions, most likely pneumonia. Mild treatment right upper lobe infiltrate Electronically Signed   By: Franchot Gallo M.D.   On: 09/05/2021 11:40   ECHOCARDIOGRAM COMPLETE  Result Date: 09/05/2021    ECHOCARDIOGRAM REPORT   Patient Name:   TAMICKA SHIMON Date of Exam: 09/05/2021 Medical Rec #:  [id number]    Height:       71.0 in Accession #:    [id number]   Weight:       97.0 lb Date of Birth:  [birth year]    BSA:          1.550 m Patient Age:    70 years     BP:           63/47 mmHg Patient Gender: F            HR:           88 bpm. Exam Location:  Inpatient Procedure: 2D Echo, 3D Echo, Cardiac Doppler, Color Doppler and Intracardiac            Opacification Agent Indications:    R06.02 SOB; I50.40* Unspecified combined systolic (congestive)                 and diastolic (congestive) heart failure  History:        Patient has prior history of Echocardiogram examinations, most                 recent 10/23/2020. Abnormal ECG, COPD, Arrythmias:Atrial                 Fibrillation, Signs/Symptoms:Chest Pain and Hypotension; Risk                 Factors:Current  Smoker. Opiate use.  Sonographer:    Roseanna Rainbow RDCS Referring Phys: [place]  1. Left ventricular ejection fraction, by estimation, is 50%. The left ventricle has low normal function. The left ventricle demonstrates regional wall motion abnormalities (see scoring diagram/findings for description). Left ventricular diastolic parameters are indeterminate. There is the interventricular septum is flattened in systole, consistent with right ventricular pressure overload.  2. Right ventricular systolic function is mildly reduced. The right ventricular size is moderate-severely enlarged. There is moderately elevated pulmonary artery systolic pressure. The estimated right ventricular systolic pressure is 43.1 mmHg.  3. Right atrial size was moderately dilated.  4. The mitral valve is normal in structure. Mild mitral valve regurgitation. No evidence of mitral stenosis.  5. Tricuspid valve regurgitation is mild to moderate.  6. The aortic valve is grossly normal. There is mild calcification of the aortic valve. Aortic valve regurgitation is trivial. No aortic stenosis is present.  7. The inferior vena cava is dilated in size with <50% respiratory variability, suggesting right atrial pressure of 15 mmHg. Conclusion(s)/Recommendation(s): No left ventricular mural or apical thrombus/thrombi. 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 Left ventricular ejection fraction, by estimation, is 50%. The left ventricle has low normal function. The left ventricle demonstrates regional wall motion abnormalities. Definity contrast agent was  given IV to delineate the left ventricular endocardial borders. The left ventricular internal cavity size was normal in size. There is no left ventricular hypertrophy. The interventricular septum is flattened in systole, consistent with right ventricular pressure overload. Left ventricular diastolic parameters are indeterminate.  LV Wall Scoring: The  apical lateral segment, apical septal segment, apical anterior segment, and apical inferior segment are hypokinetic. Right Ventricle: The right ventricular size is moderate-severely enlarged. No increase in right ventricular wall thickness. Right ventricular systolic function is mildly reduced. There is moderately elevated pulmonary artery systolic pressure. The tricuspid regurgitant velocity is 3.08 m/s, and with an assumed right atrial pressure of 15 mmHg, the estimated right ventricular systolic pressure is 32.4 mmHg. Left Atrium: Left atrial size was normal in size. Right Atrium: Right atrial size was moderately dilated. Pericardium: There is no evidence of pericardial effusion. Mitral Valve: The mitral valve is normal in structure. Mild mitral valve regurgitation. No evidence of mitral valve stenosis. Tricuspid Valve: The tricuspid valve is normal in structure. Tricuspid valve regurgitation is mild to moderate. No evidence of tricuspid stenosis. Aortic Valve: The aortic valve is grossly normal. There is mild calcification of the aortic valve. Aortic valve regurgitation is trivial. No aortic stenosis is present. Pulmonic Valve: The pulmonic valve was grossly normal. Pulmonic valve regurgitation is mild to moderate. No evidence of pulmonic stenosis. Aorta: The aortic root is normal in size and structure. Venous: The inferior vena cava is dilated in size with less than 50% respiratory variability, suggesting right atrial pressure of 15 mmHg. IAS/Shunts: No atrial level shunt detected by color flow Doppler.  LEFT VENTRICLE PLAX 2D LVIDd:         4.30 cm     Diastology LVIDs:         3.55 cm     LV e' medial:    5.93 cm/s LV PW:         0.90 cm     LV E/e' medial:  7.2 LV IVS:        0.80 cm     LV e' lateral:   8.85 cm/s LVOT diam:     2.00 cm     LV E/e' lateral: 4.8 LV SV:         44 LV SV Index:   28 LVOT Area:     3.14 cm  LV Volumes (MOD) LV vol d, MOD A2C: 79.7 ml LV vol d, MOD A4C: 73.9 ml LV vol s, MOD  A2C: 39.4 ml LV vol s, MOD A4C: 41.9 ml LV SV MOD A2C:     40.3 ml LV SV MOD A4C:     73.9 ml LV SV MOD BP:      36.8 ml RIGHT VENTRICLE             IVC RV S prime:     10.10 cm/s  IVC diam: 2.70 cm TAPSE (M-mode): 1.7 cm LEFT ATRIUM             Index        RIGHT ATRIUM           Index LA diam:        3.00 cm 1.94 cm/m   RA Area:     22.50 cm LA Vol (A2C):   62.6  ml 40.38 ml/m  RA Volume:   73.00 ml  47.09 ml/m LA Vol (A4C):   34.2 ml 22.06 ml/m LA Biplane Vol: 47.8 ml 30.83 ml/m  AORTIC VALVE             PULMONIC VALVE LVOT Vmax:   93.80 cm/s  PR End Diast Vel: 2.21 msec LVOT Vmean:  58.300 cm/s LVOT VTI:    0.139 m  AORTA Ao Root diam: 3.40 cm Ao Asc diam:  3.40 cm MITRAL VALVE               TRICUSPID VALVE MV Area (PHT): 4.21 cm    TR Peak grad:   37.9 mmHg MV Decel Time: 180 msec    TR Vmax:        308.00 cm/s MV E velocity: 42.40 cm/s MV A velocity: 69.80 cm/s  SHUNTS MV E/A ratio:  0.61        Systemic VTI:  0.14 m                            Systemic Diam: 2.00 cm Cherlynn Kaiser MD Electronically signed by Cherlynn Kaiser MD Signature Date/Time: 09/05/2021/6:42:03 PM    Final         Scheduled Meds:  (feeding supplement) PROSource Plus  30 mL Oral BID BM   amiodarone  200 mg Oral Daily   arformoterol  15 mcg Nebulization BID   bisacodyl  10 mg Rectal Daily   budesonide (PULMICORT) nebulizer solution  0.5 mg Nebulization BID   busPIRone  10 mg Oral Daily   Chlorhexidine Gluconate Cloth  6 each Topical Daily   feeding supplement  237 mL Oral BID BM   guaiFENesin  1,200 mg Oral BID   loratadine  10 mg Oral Daily   mouth rinse  15 mL Mouth Rinse BID   multivitamin with minerals  1 tablet Oral Daily   pantoprazole  40 mg Oral Q0600   polyethylene glycol  17 g Oral BID   Rivaroxaban  15 mg Oral Q breakfast   Continuous Infusions:  sodium chloride 10 mL/hr at 09/05/21 1459   magnesium sulfate bolus IVPB 4 g (09/06/21 0820)   norepinephrine (LEVOPHED) Adult infusion 1 mcg/min  (09/06/21 0800)   piperacillin-tazobactam (ZOSYN)  IV 3.375 g (09/06/21 0806)     LOS: 4 days    Time spent: 40 minutes    Irine Seal, MD [facility]   To contact the attending provider between 7A-7P or the covering provider during after hours 7P-7A, please log into the web site [email or web address] and access using universal [place] password for that web site. If you do not have the password, please call the hospital operator.  09/06/2021, 9:07 AM

## 2021-09-06 NOTE — Progress Notes (Signed)
Progress Note  Patient Name: Kristen [name] Date of Encounter: 09/06/2021  Primary Cardiologist: Freada Bergeron, MD   Subjective   Since last eval had echo performed: RV notable for RV dilation and suggestive of PH/Right heart failure.  Patient notes that she feels well. Principally notes constipation and has started a bowel regimen; during initial presentation has constipation.  Inpatient Medications    Scheduled Meds:  (feeding supplement) PROSource Plus  30 mL Oral BID BM   amiodarone  200 mg Oral Daily   arformoterol  15 mcg Nebulization BID   budesonide (PULMICORT) nebulizer solution  0.5 mg Nebulization BID   busPIRone  10 mg Oral Daily   Chlorhexidine Gluconate Cloth  6 each Topical Daily   feeding supplement  237 mL Oral BID BM   guaiFENesin  1,200 mg Oral BID   loratadine  10 mg Oral Daily   mouth rinse  15 mL Mouth Rinse BID   multivitamin with minerals  1 tablet Oral Daily   pantoprazole  40 mg Oral Q0600   Rivaroxaban  15 mg Oral Q breakfast   Continuous Infusions:  sodium chloride 10 mL/hr at 09/05/21 1459   magnesium sulfate bolus IVPB     norepinephrine (LEVOPHED) Adult infusion 2 mcg/min (09/05/21 1651)   piperacillin-tazobactam (ZOSYN)  IV 3.375 g (09/06/21 0806)   PRN Meds: acetaminophen **OR** acetaminophen, fluticasone, levalbuterol, mirtazapine, ondansetron **OR** ondansetron (ZOFRAN) IV, oxyCODONE-acetaminophen **AND** oxyCODONE, senna-docusate   Vital Signs    Vitals:   09/06/21 0026 09/06/21 0508 09/06/21 0758 09/06/21 0814  BP:      Pulse:   89   Resp:   14   Temp: 98.5 F (36.9 C) 98.1 F (36.7 C)  98.3 F (36.8 C)  TempSrc: Axillary Oral  Oral  SpO2:   92%   Weight:      Height:        Intake/Output Summary (Last 24 hours) at 09/06/2021 0818 Last data filed at 09/05/2021 1651 Gross per 24 hour  Intake 1529.31 ml  Output 150 ml  Net 1379.31 ml   Filed Weights   09/02/21 1000 09/02/21 1900 09/04/21 1000  Weight: 41.9 kg  41.9 kg 44 kg    Telemetry    SR with rare PVCs - Personally Reviewed  ECG    Sinus tachycardia rate 113 septal infarct pattern - Personally Reviewed  Physical Exam   GEN: Frail elderly woman in no acute distress.   Neck: No JVD Cardiac: RRR, no murmurs, rubs, or gallops.  Respiratory: Clear to auscultation bilaterally with decreased breath sounds GI: Soft, nontender, non-distended  MS: No edema; there are pressure wounds on both LE. Neuro:  Nonfocal  Psych: Normal affect   Labs    Chemistry Recent Labs  Lab 09/01/21 1415 09/02/21 0500 09/04/21 0234 09/05/21 0237 09/06/21 0237  NA 131*   < > 135 130* 132*  K 4.1   < > 3.1* 4.4 4.4  CL 95*   < > 101 97* 97*  CO2 30   < > _0 GLUCOSE 92   < > 119* 110* 97  BUN 20   < > _1 CREATININE 0.83   < > 0.56 0.70 0.61  CALCIUM 7.9*   < > 8.6* 8.1* 8.1*  PROT 6.1*  --   --   --   --   ALBUMIN 2.3*  --   --   --  2.1*  AST 22  --   --   --   --  ALT 10  --   --   --   --   ALKPHOS 85  --   --   --   --   BILITOT 1.2  --   --   --   --   GFRNONAA >60   < > >60 >60 >60  ANIONGAP 6   < > _0 < > = values in this interval not displayed.     Hematology Recent Labs  Lab 09/04/21 0234 09/05/21 0237 09/06/21 0237  WBC 20.0* 19.0* 17.3*  RBC 3.04* 3.04* 2.93*  HGB 8.8* 8.8* 8.3*  HCT 27.3* 27.9* 25.7*  MCV 89.8 91.8 87.7  MCH 28.9 28.9 28.3  MCHC 32.2 31.5 32.3  RDW 14.5 14.6 14.5  PLT 507* 517* 554*    Cardiac EnzymesNo results for input(s): TROPONINI in the last 168 hours. No results for input(s): TROPIPOC in the last 168 hours.   BNP Recent Labs  Lab 09/04/21 0234  BNP 942.6*     DDimer No results for input(s): DDIMER in the last 168 hours.   Radiology    DG CHEST PORT 1 VIEW  Result Date: 09/05/2021 CLINICAL DATA:  Multilobar pneumonia EXAM: PORTABLE CHEST 1 VIEW COMPARISON:  09/04/2021 FINDINGS: The large area of infiltrate in the right upper lobe slightly improved. Bibasilar  infiltrates unchanged. There is underlying COPD. Small bilateral pleural effusions unchanged. IMPRESSION: Multi lobar infiltrate and small effusions, most likely pneumonia. Mild treatment right upper lobe infiltrate Electronically Signed   By: Franchot Gallo M.D.   On: 09/05/2021 11:40   DG CHEST PORT 1 VIEW  Result Date: 09/04/2021 CLINICAL DATA:  Hypoxia EXAM: PORTABLE CHEST 1 VIEW COMPARISON:  Chest radiograph 09/01/2021 FINDINGS: The cardiomediastinal silhouette is grossly stable. Again seen is confluent opacity in the right upper lobe, worsened since 09/01/2021. Aeration throughout the remainder of the right lung has also worsened in the interim. There is a small right pleural effusion. Aeration of the left lung is not significantly changed. There is no left effusion. No pneumothorax is seen. The bones are stable. IMPRESSION: Increased confluent opacity projecting over the right upper lobe with worsened aeration throughout the remainder of the right lung. Findings concerning for worsening pneumonia with a persistent parapneumonic effusion. As before, follow-up radiographs following appropriate treatment are recommended to ensure resolution. Electronically Signed   By: Valetta Mole M.D.   On: 09/04/2021 11:17   ECHOCARDIOGRAM COMPLETE  Result Date: 09/05/2021    ECHOCARDIOGRAM REPORT   Patient Name:   Kristen [name] Date of Exam: 09/05/2021 Medical Rec #:  [id number]    Height:       71.0 in Accession #:    [id number]   Weight:       97.0 lb Date of Birth:  1946/01/30    BSA:          1.550 m Patient Age:    75 years     BP:           63/47 mmHg Patient Gender: F            HR:           88 bpm. Exam Location:  Inpatient Procedure: 2D Echo, 3D Echo, Cardiac Doppler, Color Doppler and Intracardiac            Opacification Agent Indications:    R06.02 SOB; I50.40* Unspecified combined systolic (congestive)                 and  diastolic (congestive) heart failure  History:        Patient has prior history of  Echocardiogram examinations, most                 recent 10/23/2020. Abnormal ECG, COPD, Arrythmias:Atrial                 Fibrillation, Signs/Symptoms:Chest Pain and Hypotension; Risk                 Factors:Current Smoker. Opiate use.  Sonographer:    Roseanna Rainbow RDCS Referring Phys: [place]  1. Left ventricular ejection fraction, by estimation, is 50%. The left ventricle has low normal function. The left ventricle demonstrates regional wall motion abnormalities (see scoring diagram/findings for description). Left ventricular diastolic parameters are indeterminate. There is the interventricular septum is flattened in systole, consistent with right ventricular pressure overload.  2. Right ventricular systolic function is mildly reduced. The right ventricular size is moderate-severely enlarged. There is moderately elevated pulmonary artery systolic pressure. The estimated right ventricular systolic pressure is 95.0 mmHg.  3. Right atrial size was moderately dilated.  4. The mitral valve is normal in structure. Mild mitral valve regurgitation. No evidence of mitral stenosis.  5. Tricuspid valve regurgitation is mild to moderate.  6. The aortic valve is grossly normal. There is mild calcification of the aortic valve. Aortic valve regurgitation is trivial. No aortic stenosis is present.  7. The inferior vena cava is dilated in size with <50% respiratory variability, suggesting right atrial pressure of 15 mmHg. Conclusion(s)/Recommendation(s): No left ventricular mural or apical thrombus/thrombi. 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 Left ventricular ejection fraction, by estimation, is 50%. The left ventricle has low normal function. The left ventricle demonstrates regional wall motion abnormalities. Definity contrast agent was  given IV to delineate the left ventricular endocardial borders. The left ventricular internal cavity size was normal in size. There is no left  ventricular hypertrophy. The interventricular septum is flattened in systole, consistent with right ventricular pressure overload. Left ventricular diastolic parameters are indeterminate.  LV Wall Scoring: The apical lateral segment, apical septal segment, apical anterior segment, and apical inferior segment are hypokinetic. Right Ventricle: The right ventricular size is moderate-severely enlarged. No increase in right ventricular wall thickness. Right ventricular systolic function is mildly reduced. There is moderately elevated pulmonary artery systolic pressure. The tricuspid regurgitant velocity is 3.08 m/s, and with an assumed right atrial pressure of 15 mmHg, the estimated right ventricular systolic pressure is 93.2 mmHg. Left Atrium: Left atrial size was normal in size. Right Atrium: Right atrial size was moderately dilated. Pericardium: There is no evidence of pericardial effusion. Mitral Valve: The mitral valve is normal in structure. Mild mitral valve regurgitation. No evidence of mitral valve stenosis. Tricuspid Valve: The tricuspid valve is normal in structure. Tricuspid valve regurgitation is mild to moderate. No evidence of tricuspid stenosis. Aortic Valve: The aortic valve is grossly normal. There is mild calcification of the aortic valve. Aortic valve regurgitation is trivial. No aortic stenosis is present. Pulmonic Valve: The pulmonic valve was grossly normal. Pulmonic valve regurgitation is mild to moderate. No evidence of pulmonic stenosis. Aorta: The aortic root is normal in size and structure. Venous: The inferior vena cava is dilated in size with less than 50% respiratory variability, suggesting right atrial pressure of 15 mmHg. IAS/Shunts: No atrial level shunt detected by color flow Doppler.  LEFT VENTRICLE PLAX 2D LVIDd:  4.30 cm     Diastology LVIDs:         3.55 cm     LV e' medial:    5.93 cm/s LV PW:         0.90 cm     LV E/e' medial:  7.2 LV IVS:        0.80 cm     LV e' lateral:    8.85 cm/s LVOT diam:     2.00 cm     LV E/e' lateral: 4.8 LV SV:         44 LV SV Index:   28 LVOT Area:     3.14 cm  LV Volumes (MOD) LV vol d, MOD A2C: 79.7 ml LV vol d, MOD A4C: 73.9 ml LV vol s, MOD A2C: 39.4 ml LV vol s, MOD A4C: 41.9 ml LV SV MOD A2C:     40.3 ml LV SV MOD A4C:     73.9 ml LV SV MOD BP:      36.8 ml RIGHT VENTRICLE             IVC RV S prime:     10.10 cm/s  IVC diam: 2.70 cm TAPSE (M-mode): 1.7 cm LEFT ATRIUM             Index        RIGHT ATRIUM           Index LA diam:        3.00 cm 1.94 cm/m   RA Area:     22.50 cm LA Vol (A2C):   62.6 ml 40.38 ml/m  RA Volume:   73.00 ml  47.09 ml/m LA Vol (A4C):   34.2 ml 22.06 ml/m LA Biplane Vol: 47.8 ml 30.83 ml/m  AORTIC VALVE             PULMONIC VALVE LVOT Vmax:   93.80 cm/s  PR End Diast Vel: 2.21 msec LVOT Vmean:  58.300 cm/s LVOT VTI:    0.139 m  AORTA Ao Root diam: 3.40 cm Ao Asc diam:  3.40 cm MITRAL VALVE               TRICUSPID VALVE MV Area (PHT): 4.21 cm    TR Peak grad:   37.9 mmHg MV Decel Time: 180 msec    TR Vmax:        308.00 cm/s MV E velocity: 42.40 cm/s MV A velocity: 69.80 cm/s  SHUNTS MV E/A ratio:  0.61        Systemic VTI:  0.14 m                            Systemic Diam: 2.00 cm Cherlynn Kaiser MD Electronically signed by Cherlynn Kaiser MD Signature Date/Time: 09/05/2021/6:42:03 PM    Final      Patient Profile     75 y.o. female COPD on home O2 3 L, PAF on amiodarone and Xarelto, CHADSVASC 3, frailty with 5 admission into our system in 2022 and protein calorie malnutrition seen for RV failure, AF and for troponin elevation 09/04/21 (suspect demand ischemia from hypotension)  Assessment & Plan    RV dysfunction and enlargement Protein calorie malnutrition - suspect related to COPD - planned for low dose lasix (MAR reviewed) which is reasonable; patient did well with 20 IV lasix; would be cautious in the setting of 09/05/21 hypotension (s/p albumin) - PCCM following  PAF - presently in SR -  continue DOAC and Xarelto  Long term prognosis and multiple admissions is concerning; Palliative Care eval during this admisson may be reasonable  For questions or updates, please contact [place] Please consult [email or web address] for contact info under Cardiology/STEMI.      Signed, Werner Lean, MD  09/06/2021, 8:18 AM

## 2021-09-06 NOTE — Progress Notes (Addendum)
NAME:  Kristen [name], MRN:  [id number], DOB:  December 05, 1945, LOS: 4 ADMISSION DATE:  09/01/2021, CONSULTATION DATE:  09/04/2021 REFERRING MD:  Dr. Grandville Silos, CHIEF COMPLAINT:  Resp failure   History of Present Illness:  75 year old female with past medical history significant for former smoker, COPD, chronic hypoxic respiratory failure on home O2 3L [place], HFpEF, Afib on Xarelto, venous/ arterial insufficiency, and protein calorie malnutrition admitted to [facility] on 10/10 with sepsis, hypoxic respiratory failure, and pneumonia.   She presented to ER due to progressive generalized weakness, right sided chest pain, chills, productive cough with greenish-brown sputum, and diarrhea x 5 days.   She reports she was trying to avoid coming to the hospital and waited but now regrets waiting.  Had increased her home O2 to 3.5 L mostly due to feeling anxious.  She denies any fever, hemoptysis, dysphagia, or choking episodes.  She is divorced, lives with her sister, and has two children who live in [place].   Found to be afebrile with with soft blood pressures, improved after albumin, and labs showing leukocytosis, Na 131, BNP 942, Flu/ SARs neg, negative urine legionella and strep, and CXR with new extensive consolidation in the lateral right midlung and right base c/w with pneumonia, chronic hyperexpansion, and small right pleural effusion.  She since has had an increasing O2 requirement, placed on high flow [place] but now with trial of BiPAP.  Started on low dose lasix this morning for volume overload.  She was treated in July 2022 for an aspiration pneumonia, therefore started on CAP coverage, changed to unasyn but coverage expanded to zosyn today given CXR progression.  No further diarrhea since admission.  This is her fifth hospitalization this year for respiratory failure related to COPD and/ or aspiration pneumonia.  She is a DNI.  PCCM consulted for further recommendations.  Pertinent  Medical History  Former smoker, COPD,  chronic hypoxic respiratory failure on home O2 3L [place], Afib on Xarelto, venous/ arterial insufficiency, protein calorie malnutrition, HFpEF, hyponatremia, COVID 05/2021  Significant Hospital Events: Including procedures, antibiotic start and stop dates in addition to other pertinent events   10/10 admitted with sepsis, pna 10/13 PCCM consulted  10/14 required intermittent levophed peripherally  Interim History / Subjective:   No acute events overnight. Patient is feeling better this morning. She has eaten a full breakfast. She continues to cough up sputum.   Echo 10/14 shows LVEF 50%. RV pressure overload noted. RV systolic function is mildly reduced. Moderately elevated PASP. Right atrial size is mildly dilated.   Objective   Blood pressure 101/60, pulse 85, temperature 98.1 F (36.7 C), temperature source Oral, resp. rate (!) 28, height _0  (1.803 m), weight 44 kg, SpO2 (!) 88 %.    FiO2 (%):  [60 %] 60 %   Intake/Output Summary (Last 24 hours) at 09/06/2021 0726 Last data filed at 09/05/2021 1651 Gross per 24 hour  Intake 1529.31 ml  Output 150 ml  Net 1379.31 ml   Filed Weights   09/02/21 1000 09/02/21 1900 09/04/21 1000  Weight: 41.9 kg 41.9 kg 44 kg   Examination: General:  frail elderly woman, no acute distress HEENT: moist mucous membranes, sclera anicteric Neuro: Alert, oriented, moving all extremities CV: rrr, no murmurs PULM:  course breath sounds on right, no wheezing. HFNC in place GI: soft, bs+, NT, ND Extremities: warm/dry, no edema Skin: no rashes   Resolved Hospital Problem list    Assessment & Plan:  Septic Shock - required  peripheral levophed yesterday, has been weaned off this morning - continue zosyn - MRSA screen negative. Urine legionella and strep negative - Fluid resuscitation PRN - random cortisol 16, less concern for adrenal insufficiency  Acute on Chronic Hypoxemic Respiratory Failure ?Pulmonary Hypertension Pneumonia COPD, 3L O2  baseline - Continue nebulizer treatments - Continue antibiotics as above - PRN Bipap - Continue HFNC, wean for goal SpO2 90-92% - Will need follow up imaging in 6-8 weeks for monitoring of resolution of the dense right upper lobe pneumonia - Bronchial hygiene with nebs, incentive spirometer and flutter valve - Echo concerning for pulmonary hypertension, likely in setting of group III disease with chronic respiratory failure. She should have sleep study performed in outpatient setting.   PCCM will continue to follow  Remained per primary service Diarrhea- resolved PAF- on xarelto, in NSR Prolonged Qtc Chronic hyponatremia Moderate protein calorie malnutrition   Best Practice (right click and "Reselect all SmartList Selections" daily)   Diet/type: Regular consistency (see orders) DVT prophylaxis: DOAC GI prophylaxis: PPI Lines: N/A Foley:  N/A Code Status:  limited Last date of multidisciplinary goals of care discussion [per primary]  Labs   CBC: Recent Labs  Lab 09/01/21 1415 09/02/21 0500 09/03/21 0545 09/04/21 0234 09/05/21 0237 09/06/21 0237  WBC 17.5* 13.3* 20.2* 20.0* 19.0* 17.3*  NEUTROABS 14.9*  --   --   --  15.3* 13.3*  HGB 11.1* 10.4* 10.2* 8.8* 8.8* 8.3*  HCT 35.4* 32.6* 31.0* 27.3* 27.9* 25.7*  MCV 91.2 90.3 87.8 89.8 91.8 87.7  PLT 501* 514* 567* 507* 517* 554*    Basic Metabolic Panel: Recent Labs  Lab 09/02/21 0500 09/03/21 0545 09/04/21 0234 09/05/21 0237 09/06/21 0237  NA 129* 130* 135 130* 132*  K 2.9* 3.8 3.1* 4.4 4.4  CL 96* 98 101 97* 97*  CO2 _0 GLUCOSE 88 95 119* 110* 97  BUN _1 CREATININE 0.60 0.65 0.56 0.70 0.61  CALCIUM 7.8* 7.8* 8.6* 8.1* 8.1*  MG  --  1.1* 1.7 1.8 1.7  PHOS  --   --   --   --  2.5   GFR: Estimated Creatinine Clearance: 41.6 mL/min (by C-G formula based on SCr of 0.61 mg/dL). Recent Labs  Lab 09/01/21 1402 09/01/21 1415 09/01/21 1601 09/02/21 0500 09/03/21 0545 09/04/21 0234  09/05/21 0237 09/06/21 0237  WBC  --    < >  --    < > 20.2* 20.0* 19.0* 17.3*  LATICACIDVEN 1.9  --  0.9  --   --   --   --   --    < > = values in this interval not displayed.    Liver Function Tests: Recent Labs  Lab 09/01/21 1415 09/06/21 0237  AST 22  --   ALT 10  --   ALKPHOS 85  --   BILITOT 1.2  --   PROT 6.1*  --   ALBUMIN 2.3* 2.1*   No results for input(s): LIPASE, AMYLASE in the last 168 hours. No results for input(s): AMMONIA in the last 168 hours.  ABG    Component Value Date/Time   PHART 7.394 09/04/2021 0811   PCO2ART 44.3 09/04/2021 0811   PO2ART 58.5 (L) 09/04/2021 0811   HCO3 26.4 09/04/2021 0811   TCO2 42 (H) 02/22/2021 1655   ACIDBASEDEF 2.0 06/28/2020 1750   O2SAT 89.6 09/04/2021 0811     Coagulation Profile: Recent Labs  Lab 09/01/21 1415  INR 1.9*  Cardiac Enzymes: No results for input(s): CKTOTAL, CKMB, CKMBINDEX, TROPONINI in the last 168 hours.  HbA1C: Hgb A1c MFr Bld  Date/Time Value Ref Range Status  06/29/2020 04:27 PM 5.6 4.8 - 5.6 % Final    Comment:    (NOTE) Pre diabetes:          5.7%-6.4%  Diabetes:              >6.4%  Glycemic control for   <7.0% adults with diabetes     CBG: No results for input(s): GLUCAP in the last 168 hours.  Critical care time: 35 minutes    Freda Jackson, MD [facility] Office: [phone]   See Amion for personal pager PCCM on call pager [phone] until 7pm. Please call [place] 7p-7a. [phone]

## 2021-09-07 DIAGNOSIS — J189 Pneumonia, unspecified organism: Secondary | ICD-10-CM | POA: Diagnosis not present

## 2021-09-07 DIAGNOSIS — A419 Sepsis, unspecified organism: Secondary | ICD-10-CM | POA: Diagnosis not present

## 2021-09-07 DIAGNOSIS — J9601 Acute respiratory failure with hypoxia: Secondary | ICD-10-CM | POA: Diagnosis not present

## 2021-09-07 DIAGNOSIS — J690 Pneumonitis due to inhalation of food and vomit: Secondary | ICD-10-CM | POA: Diagnosis not present

## 2021-09-07 DIAGNOSIS — J9611 Chronic respiratory failure with hypoxia: Secondary | ICD-10-CM | POA: Diagnosis not present

## 2021-09-07 DIAGNOSIS — I480 Paroxysmal atrial fibrillation: Secondary | ICD-10-CM | POA: Diagnosis not present

## 2021-09-07 LAB — CBC WITH DIFFERENTIAL/PLATELET
Abs Immature Granulocytes: 0.36 10*3/uL — ABNORMAL HIGH (ref 0.00–0.07)
Basophils Absolute: 0 10*3/uL (ref 0.0–0.1)
Basophils Relative: 0 %
Eosinophils Absolute: 0.4 10*3/uL (ref 0.0–0.5)
Eosinophils Relative: 2 %
HCT: 27.5 % — ABNORMAL LOW (ref 36.0–46.0)
Hemoglobin: 8.9 g/dL — ABNORMAL LOW (ref 12.0–15.0)
Immature Granulocytes: 2 %
Lymphocytes Relative: 11 %
Lymphs Abs: 1.8 10*3/uL (ref 0.7–4.0)
MCH: 28.7 pg (ref 26.0–34.0)
MCHC: 32.4 g/dL (ref 30.0–36.0)
MCV: 88.7 fL (ref 80.0–100.0)
Monocytes Absolute: 1.1 10*3/uL — ABNORMAL HIGH (ref 0.1–1.0)
Monocytes Relative: 7 %
Neutro Abs: 13.5 10*3/uL — ABNORMAL HIGH (ref 1.7–7.7)
Neutrophils Relative %: 78 %
Platelets: 552 10*3/uL — ABNORMAL HIGH (ref 150–400)
RBC: 3.1 MIL/uL — ABNORMAL LOW (ref 3.87–5.11)
RDW: 14.6 % (ref 11.5–15.5)
WBC: 17.2 10*3/uL — ABNORMAL HIGH (ref 4.0–10.5)
nRBC: 0 % (ref 0.0–0.2)

## 2021-09-07 LAB — RENAL FUNCTION PANEL
Albumin: 2.1 g/dL — ABNORMAL LOW (ref 3.5–5.0)
Anion gap: 6 (ref 5–15)
BUN: 15 mg/dL (ref 8–23)
CO2: 30 mmol/L (ref 22–32)
Calcium: 8.1 mg/dL — ABNORMAL LOW (ref 8.9–10.3)
Chloride: 92 mmol/L — ABNORMAL LOW (ref 98–111)
Creatinine, Ser: 0.61 mg/dL (ref 0.44–1.00)
GFR, Estimated: >60 mL/min (ref >60)
Glucose, Bld: 124 mg/dL — ABNORMAL HIGH (ref 70–99)
Phosphorus: 2.7 mg/dL (ref 2.5–4.6)
Potassium: 4 mmol/L (ref 3.5–5.1)
Sodium: 128 mmol/L — ABNORMAL LOW (ref 135–145)

## 2021-09-07 LAB — MAGNESIUM: Magnesium: 1.7 mg/dL (ref 1.7–2.4)

## 2021-09-07 MED ORDER — MIDODRINE HCL 5 MG PO TABS
5.0000 mg | ORAL_TABLET | Freq: Three times a day (TID) | ORAL | Status: DC
  Administered 2021-09-07 – 2021-09-17 (×30): 5 mg via ORAL
  Filled 2021-09-07 (×32): qty 1

## 2021-09-07 MED ORDER — OXYCODONE HCL 5 MG PO TABS
5.0000 mg | ORAL_TABLET | ORAL | Status: DC | PRN
  Administered 2021-09-07 – 2021-09-17 (×48): 5 mg via ORAL
  Filled 2021-09-07 (×50): qty 1

## 2021-09-07 MED ORDER — MAGNESIUM SULFATE 4 GM/100ML IV SOLN
4.0000 g | Freq: Once | INTRAVENOUS | Status: AC
  Administered 2021-09-07: 4 g via INTRAVENOUS
  Filled 2021-09-07: qty 100

## 2021-09-07 MED ORDER — ACETAMINOPHEN 500 MG PO TABS
500.0000 mg | ORAL_TABLET | Freq: Three times a day (TID) | ORAL | Status: AC
Start: 2021-09-07 — End: 2021-09-14
  Administered 2021-09-07 – 2021-09-14 (×21): 500 mg via ORAL
  Filled 2021-09-07 (×21): qty 1

## 2021-09-07 MED ORDER — FLUTICASONE PROPIONATE 50 MCG/ACT NA SUSP
2.0000 | Freq: Every day | NASAL | Status: DC
  Administered 2021-09-07 – 2021-09-17 (×9): 2 via NASAL
  Filled 2021-09-07 (×2): qty 16

## 2021-09-07 MED ORDER — CLOPIDOGREL BISULFATE 75 MG PO TABS
75.0000 mg | ORAL_TABLET | Freq: Every day | ORAL | Status: DC
  Administered 2021-09-07 – 2021-09-17 (×11): 75 mg via ORAL
  Filled 2021-09-07 (×12): qty 1

## 2021-09-07 NOTE — Progress Notes (Signed)
PROGRESS NOTE    Kristen [name]  AXE:[id number] DOB: Nov 08, 1946 DOA: 09/01/2021 PCP: [name], MD   No chief complaint on file.   Brief Narrative:  Kristen [name] is a 75 y.o. female with medical history significant of COPD with chronic hypoxic respiratory failure on 3 L/min oxygen at baseline, chronic A-fib, venous insufficiency, hx of transient hypotension, hospital admission in July of this year for aspiration pneumonia and acute on chronic hypoxic respiratory failure.  She was brought to [facility] ED from home by EMS due to ongoing diarrhea x 5 days, productive cough with associated right-sided chest pain and chills, with progressive generalized weakness.  Denies abdominal pain, nausea, vomiting, dysuria, focal weakness numbness or tingling, headaches or other recent symptoms.  Reports increasing her oxygen briefly to 3.5 L/min mostly due to feeling anxious.  Otherwise has been on her usual 3 L/min. Chest xray showed new extensive consolidation in the lateral right middle and lower lung, consistent with PNA, small right pleural effusion and chronic hyperexpansion compatible with emphysema. Patient is followed by the IM residency service at [facility], but this states she would prefer to be admitted here to [facility] this time.   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 -On admission patient noted to have met criteria for sepsis with leukocytosis, tachypnea, pneumonia. -Patient with a rising leukocytosis however started to slowly trend back down with broadening of IV antibiotic coverage..   -White count today at 17.2 from 17.3 from 19.0 from 20.  -Patient with some improvement with O2 requirements currently on 5 L high flow nasal cannula with sats of 92%.     -Blood cultures pending with no growth to date x5 days, MRSA PCR negative.  Urine cultures with multiple species. -Sputum gram stain and culture with no organisms seen. -Improving clinically respiratory wise -IV antibiotics broadened from Unasyn to IV Zosyn which we will continue for now. -Continue Brovana, Pulmicort, scheduled Xopenex and Atrovent nebs, Mucinex, Claritin.  Add Flonase. -Supportive care.  2.  Hypotension -Patient with systolic blood pressures noted to be low in the 80s on 09/03/2021.   -Random cortisol 16.8 on 09/05/2021. -IV albumin every 6 hours x24 hours given with some improvement with blood pressure however blood pressure was soft on 09/05/2021 and patient became hypotensive requiring Levophed pressor which has been weaned off as of 09/25/1593.   -Systolic blood pressures in the low 100s. -Diuretics discontinued.   -Supportive care.   -PCCM following.    3.  Acute on chronic hypoxic respiratory failure in the setting of COPD likely secondary to aspiration pneumonia and probable volume overload/acute diastolic CHF exacerbation/abnormal 2D echo -Likely secondary to pneumonia/aspiration pneumonia in the setting of possible volume overload. -Patient with use of accessory muscles of respiration the morning of 09/04/2021,  with complaints of shortness of  breath and currently on 15 L high flow nasal cannula with sats of 97% on 09/05/2021. -Currently on 5 L high flow nasal cannula with sats 92  %. -Improved clinically since 09/04/2021 after trial of BiPAP, broadening of antibiotics. -BNP obtained at 942.6. -2D echo from 10/23/2020 with a EF of 55 to 82%,LMBE, grade 1 diastolic dysfunction, interventricular septum flattened in diastole consistent with right ventricular volume overload, low normal right ventricular systolic function, right ventricular size normal. -2D echo from 09/05/2021 with EF of 50%, left ventricle with wall motion abnormalities, left ventricular diastolic parameters indeterminate, interventricular septum is flattened in systole consistent with right ventricular pressure overload, mildly reduced right ventricular systolic function, right ventricular size is moderately to severely enlarged, moderately elevated pulmonary artery systolic pressure, right atrial size moderately dilated. -Patient on home O2 with a baseline of 3 L nasal cannula. -Blood pressure improved post IV albumin however patient remained hypotensive on 09/05/2021 requiring placement on pressors/Levophed.  -Levophed pressors weaned off 09/06/2021. -Repeat chest x-ray from 09/04/2021 with increased confluent opacity over the right upper lobe with worsening variation throughout remainder of right lung concerning for worsening pneumonia with a persistent parapneumonic effusion.   -Repeat chest x-ray from 09/05/2021 with multilobular infiltrate and small effusions, most likely pneumonia, mild treatment right upper lobe infiltrate.  -ABG with a pH of 7.394, PCO2 of 44, PO2 of 58.5. -Status post Lasix 20 mg IV x1 on 09/04/2021 with urine output of 800 cc recorded.   -Blood pressure soft and patient became hypotensive during the day on 09/05/2021 requiring pressor support which has been weaned off.  -Diuretics discontinued. -Strict I's and O's.  -BiPAP as needed.  -Continue scheduled Xopenex and Atrovent nebs, Pulmicort, Brovana, Claritin, PPI.   -Add Flonase. -IV antibiotics have been broadened to IV Zosyn which  we will continue for now.   -Cardiology following. -PCCM following and appreciate their input and recommendations.  -May benefit from sleep study in the outpatient setting on discharge.  4.  Elevated troponin/abnormal 2D echo -Patient with acute on chronic respiratory distress on 09/04/2021, noted to have elevated BNP with concerns for volume overload.  Cardiac enzymes were elevated. -2D echo with EF of 50%, left ventricle with wall motion abnormalities, left ventricular diastolic parameters indeterminate, interventricular septum is flattened in systole consistent with right ventricular pressure overload, mildly reduced right ventricular systolic function, right ventricular size is moderately to severely enlarged, moderately elevated pulmonary artery systolic pressure, right atrial size moderately dilated. -Patient seen in consultation by cardiology this point in time suspect no need for further evaluation given her comorbid illness and frailty. -Patient noted to be hypotensive was on pressors which have been weaned off.  Hold off on starting beta-blocker.   -Per cardiology.    5.  Diarrhea>>>.  Constipation -Patient had reported 5 days of diarrhea at home prior to admission. -No bowel movement since admission. -Patient with complaints of constipation. -Received a dose of sorbitol on 09/05/2021 with no results.   -Patient placed on daily Dulcolax suppositories, MiraLAX twice daily with bowel movement noted 09/06/2021.   -Continue current bowel regimen.    6.  Paroxysmal atrial fibrillation -Continue amiodarone for rate control.   -Xarelto for anticoagulation.   -Cardiology following.  7.  Prolonged QTC -Patient noted to have a QTC of 560 on admission, chronically on amiodarone. -Repeat EKG with resolution of QT prolongation currently at 437. -Keep potassium approximately at 4, magnesium approximately at 2.  8.  Chronic hyponatremia -Currently close to baseline at  128..    9.  Chronic  venous stasis of bilateral lower extremities/venous insufficiency with ulceration -Continue current wound treatment recommendations per Neffs RN  10.  Hypomagnesemia/hypokalemia -Magnesium at 1.7, potassium at 4.0.   -Magnesium sulfate 4 g IV x1.   -Follow.    11.  Moderate protein calorie malnutrition -Patient cachectic and frail on examination. -Albumin level at 2.3 on admission. -Diet liberalized.  -Status post IV albumin every 6 hours x1 day.   -Albumin currently at 2.1.   -Continue Ensure nutritional supplementation.   -RD consulted.    12.  Goals of care -It is noted that palliative care was to follow-up with patient after recent discharge, patient is a DO NOT INTUBATE.  Patient does express desire for no CPR as she feels could break her ribs.  Patient is accepting of ACLS medications if needed.  CODE STATUS has been changed to limited code.   -Palliative care consultation pending.             DVT prophylaxis: Xarelto Code Status: Partial code Family Communication: Updated patient.  No family at bedside. Disposition:   Status is: Inpatient  Remains inpatient appropriate because:Inpatient level of care appropriate due to severity of illness  Dispo: The patient is from: Home              Anticipated d/c is to:  TBD              Patient currently is not medically stable to d/c.   Difficult to place patient No       Consultants:  WOC Phineas Douglas, RN 09/02/2021 PCCM: Dr.Olalere 09/04/2021 Cardiology: Dr. Percival Spanish 09/04/2021  Procedures:  Chest x-ray 09/01/2021, 09/04/2021 2D echo 09/05/2021   Antimicrobials: IV Unasyn 09/01/2021>>>>> 09/04/2021 IV azithromycin 10/10 /2022x1 dose IV Rocephin 10/10/ 2022x1 dose IV Zosyn 09/04/2021>>>>>   Subjective: Sitting up in chair.  Overall feeling better.  Denies any chest pain.  Feels shortness of breath is improving.  Stated had some postnasal drip.  Stated had bowel movement yesterday and feels might need to  have another bowel movement today.  On 5 L high flow nasal cannula with sats of 92% this morning.  Objective: Vitals:   09/07/21 0700 09/07/21 0730 09/07/21 0747 09/07/21 0800  BP: (!) 98/55   109/62  Pulse: 84   88  Resp: (!) 25   13  Temp:  98.6 F (37 C)  98.6 F (37 C)  TempSrc:  Oral  Oral  SpO2: 97%  97% 92%  Weight:      Height:        Intake/Output Summary (Last 24 hours) at 09/07/2021 0923 Last data filed at 09/07/2021 0800 Gross per 24 hour  Intake 519.24 ml  Output 1150 ml  Net -630.76 ml    Filed Weights   09/02/21 1000 09/02/21 1900 09/04/21 1000  Weight: 41.9 kg 41.9 kg 44 kg    Examination:  General exam: Frail.  Cachectic. Respiratory system: Lungs clear to auscultation bilaterally.  No wheezes, no crackles, no rhonchi.  Fair air movement.  Speaking in full sentences.  No use of accessory muscles of respiration.   Cardiovascular system: Regular rate rhythm no murmurs rubs or gallops.  No JVD.  No lower extremity edema.  Gastrointestinal system: Abdomen is soft, mildly distended, nontender, positive bowel sounds.  No rebound.  No guarding.   Central nervous system: Alert and oriented.  Moving extremities spontaneously.  No focal neurological deficits.  Extremities: Symmetric 5 x 5 power.  Skin: No rashes, lesions or ulcers Psychiatry: Judgement and insight appear normal. Mood & affect appropriate.     Data Reviewed: I have personally reviewed following labs and imaging studies  CBC: Recent Labs  Lab 09/01/21 1415 09/02/21 0500 09/03/21 0545 09/04/21 0234 09/05/21 0237 09/06/21 0237 09/07/21 0241  WBC 17.5*   < > 20.2* 20.0* 19.0* 17.3* 17.2*  NEUTROABS 14.9*  --   --   --  15.3* 13.3* 13.5*  HGB 11.1*   < > 10.2* 8.8* 8.8* 8.3* 8.9*  HCT 35.4*   < > 31.0* 27.3* 27.9* 25.7* 27.5*  MCV 91.2   < > 87.8 89.8 91.8 87.7 88.7  PLT 501*   < > 567* 507* 517* 554* 552*   < > = values in this interval not displayed.     Basic Metabolic  Panel: Recent Labs  Lab 09/03/21 0545 09/04/21 0234 09/05/21 0237 09/06/21 0237 09/07/21 0241  NA 130* 135 130* 132* 128*  K 3.8 3.1* 4.4 4.4 4.0  CL 98 101 97* 97* 92*  CO2 _0 GLUCOSE 95 119* 110* 97 124*  BUN _1 CREATININE 0.65 0.56 0.70 0.61 0.61  CALCIUM 7.8* 8.6* 8.1* 8.1* 8.1*  MG 1.1* 1.7 1.8 1.7 1.7  PHOS  --   --   --  2.5 2.7     GFR: Estimated Creatinine Clearance: 41.6 mL/min (by C-G formula based on SCr of 0.61 mg/dL).  Liver Function Tests: Recent Labs  Lab 09/01/21 1415 09/06/21 0237 09/07/21 0241  AST 22  --   --   ALT 10  --   --   ALKPHOS 85  --   --   BILITOT 1.2  --   --   PROT 6.1*  --   --   ALBUMIN 2.3* 2.1* 2.1*     CBG: No results for input(s): GLUCAP in the last 168 hours.   Recent Results (from the past 240 hour(s))  Blood culture (routine single)     Status: None   Collection Time: 09/01/21  2:15 PM   Specimen: BLOOD  Result Value Ref Range Status   Specimen Description   Final    BLOOD LEFT ANTECUBITAL Performed at [facility] [address] [place], [place] [address]    Special Requests   Final    BOTTLES DRAWN AEROBIC AND ANAEROBIC Blood Culture adequate volume Performed at [facility] [address] [place], [place] [address]    Culture   Final    NO GROWTH 5 DAYS Performed at [facility], [place] [address], [place], [place] [address]    Report Status 09/06/2021 FINAL  Final  Resp Panel by RT-PCR (Flu A&B, Covid)     Status: None   Collection Time: 09/01/21  2:20 PM   Specimen: Nasopharyngeal(NP) swabs in vial transport medium  Result Value Ref Range Status   SARS Coronavirus 2 by RT PCR NEGATIVE NEGATIVE Final    Comment: (NOTE) SARS-CoV-2 target nucleic acids are NOT DETECTED.  The SARS-CoV-2 RNA is generally detectable in upper respiratory specimens during the acute phase of infection. The lowest concentration of SARS-CoV-2 viral copies  this assay can detect is 138 copies/mL. A negative result does not preclude SARS-Cov-2 infection and should not be used as the sole basis for treatment or other patient management decisions. A negative result may occur with  improper specimen collection/handling, submission of specimen other than nasopharyngeal swab, presence  of viral mutation(s) within the areas targeted by this assay, and inadequate number of viral copies(<138 copies/mL). A negative result must be combined with clinical observations, patient history, and epidemiological information. The expected result is Negative.  Fact Sheet for Patients:  EntrepreneurPulse.com.au  Fact Sheet for Healthcare Providers:  IncredibleEmployment.be  This test is no t yet approved or cleared by the [place] FDA and  has been authorized for detection and/or diagnosis of SARS-CoV-2 by FDA under an Emergency Use Authorization (EUA). This EUA will remain  in effect (meaning this test can be used) for the duration of the COVID-19 declaration under Section 564(b)(1) of the Act, 21 U.S.C.section 360bbb-3(b)(1), unless the authorization is terminated  or revoked sooner.       Influenza A by PCR NEGATIVE NEGATIVE Final   Influenza B by PCR NEGATIVE NEGATIVE Final    Comment: (NOTE) The Xpert Xpress SARS-CoV-2/FLU/RSV plus assay is intended as an aid in the diagnosis of influenza from Nasopharyngeal swab specimens and should not be used as a sole basis for treatment. Nasal washings and aspirates are unacceptable for Xpert Xpress SARS-CoV-2/FLU/RSV testing.  Fact Sheet for Patients: EntrepreneurPulse.com.au  Fact Sheet for Healthcare Providers: IncredibleEmployment.be  This test is not yet approved or cleared by the [place] FDA and has been authorized for detection and/or diagnosis of SARS-CoV-2 by FDA under an Emergency Use Authorization (EUA). This EUA  will remain in effect (meaning this test can be used) for the duration of the COVID-19 declaration under Section 564(b)(1) of the Act, 21 U.S.C. section 360bbb-3(b)(1), unless the authorization is terminated or revoked.  Performed at [facility], [place] [address], [place], [place] [address]   Urine Culture     Status: Abnormal   Collection Time: 09/01/21  8:00 PM   Specimen: In/Out Cath Urine  Result Value Ref Range Status   Specimen Description   Final    IN/OUT CATH URINE Performed at [facility] [address] [place], [place] [address]    Special Requests   Final    NONE Performed at [facility], [place] [address], [place], [place] [address]    Culture MULTIPLE SPECIES PRESENT, SUGGEST RECOLLECTION (A)  Final   Report Status 09/02/2021 FINAL  Final  MRSA Next Gen by PCR, Nasal     Status: None   Collection Time: 09/02/21 11:34 PM   Specimen: Nasal Mucosa; Nasal Swab  Result Value Ref Range Status   MRSA by PCR Next Gen NOT DETECTED NOT DETECTED Final    Comment: (NOTE) The GeneXpert MRSA Assay (FDA approved for NASAL specimens only), is one component of a comprehensive MRSA colonization surveillance program. It is not intended to diagnose MRSA infection nor to guide or monitor treatment for MRSA infections. Test performance is not FDA approved in patients less than 74 years old. Performed at [facility], [place] [address], [place], [place] [address]   Expectorated Sputum Assessment w Gram Stain, Rflx to Resp Cult     Status: None   Collection Time: 09/05/21 12:13 PM   Specimen: Sputum  Result Value Ref Range Status   Specimen Description SPU  Final   Special Requests NONE  Final   Sputum evaluation   Final    THIS SPECIMEN IS ACCEPTABLE FOR SPUTUM CULTURE Performed at [facility], [place] [address], [place], [place] [address]    Report Status 09/05/2021 FINAL  Final  Culture,  Respiratory w Gram Stain  Status: None (Preliminary result)   Collection Time: 09/05/21 12:13 PM   Specimen: Sputum  Result Value Ref Range Status   Specimen Description   Final    SPU Performed at [facility] [address], [place], [place] [address]    Special Requests   Final    NONE Reflexed from [id number] Performed at [facility], [place] [address], [place], [place] [address]    Gram Stain   Final    ABUNDANT WBC PRESENT,BOTH PMN AND MONONUCLEAR FEW SQUAMOUS EPITHELIAL CELLS PRESENT NO ORGANISMS SEEN    Culture   Final    CULTURE REINCUBATED FOR BETTER GROWTH Performed at [facility], [address], [place], [place] [address]    Report Status PENDING  Incomplete          Radiology Studies: ECHOCARDIOGRAM COMPLETE  Result Date: 09/05/2021    ECHOCARDIOGRAM REPORT   Patient Name:   Kristen [name] Date of Exam: 09/05/2021 Medical Rec #:  [id number]    Height:       71.0 in Accession #:    [id number]   Weight:       97.0 lb Date of Birth:  11-11-1946    BSA:          1.550 m Patient Age:    77 years     BP:           63/47 mmHg Patient Gender: F            HR:           88 bpm. Exam Location:  Inpatient Procedure: 2D Echo, 3D Echo, Cardiac Doppler, Color Doppler and Intracardiac            Opacification Agent Indications:    R06.02 SOB; I50.40* Unspecified combined systolic (congestive)                 and diastolic (congestive) heart failure  History:        Patient has prior history of Echocardiogram examinations, most                 recent 10/23/2020. Abnormal ECG, COPD, Arrythmias:Atrial                 Fibrillation, Signs/Symptoms:Chest Pain and Hypotension; Risk                 Factors:Current Smoker. Opiate use.  Sonographer:    Roseanna Rainbow RDCS Referring Phys: [place]  1. Left ventricular ejection fraction, by estimation, is 50%. The left ventricle has low normal function. The left ventricle demonstrates  regional wall motion abnormalities (see scoring diagram/findings for description). Left ventricular diastolic parameters are indeterminate. There is the interventricular septum is flattened in systole, consistent with right ventricular pressure overload.  2. Right ventricular systolic function is mildly reduced. The right ventricular size is moderate-severely enlarged. There is moderately elevated pulmonary artery systolic pressure. The estimated right ventricular systolic pressure is 34.7 mmHg.  3. Right atrial size was moderately dilated.  4. The mitral valve is normal in structure. Mild mitral valve regurgitation. No evidence of mitral stenosis.  5. Tricuspid valve regurgitation is mild to moderate.  6. The aortic valve is grossly normal. There is mild calcification of the aortic valve. Aortic valve regurgitation is trivial. No aortic stenosis is present.  7. The inferior vena cava is dilated in size with <50% respiratory variability, suggesting right atrial pressure of 15 mmHg. Conclusion(s)/Recommendation(s): No left ventricular  mural or apical thrombus/thrombi. 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 Left ventricular ejection fraction, by estimation, is 50%. The left ventricle has low normal function. The left ventricle demonstrates regional wall motion abnormalities. Definity contrast agent was  given IV to delineate the left ventricular endocardial borders. The left ventricular internal cavity size was normal in size. There is no left ventricular hypertrophy. The interventricular septum is flattened in systole, consistent with right ventricular pressure overload. Left ventricular diastolic parameters are indeterminate.  LV Wall Scoring: The apical lateral segment, apical septal segment, apical anterior segment, and apical inferior segment are hypokinetic. Right Ventricle: The right ventricular size is moderate-severely enlarged. No increase in right ventricular wall thickness. Right  ventricular systolic function is mildly reduced. There is moderately elevated pulmonary artery systolic pressure. The tricuspid regurgitant velocity is 3.08 m/s, and with an assumed right atrial pressure of 15 mmHg, the estimated right ventricular systolic pressure is 10.0 mmHg. Left Atrium: Left atrial size was normal in size. Right Atrium: Right atrial size was moderately dilated. Pericardium: There is no evidence of pericardial effusion. Mitral Valve: The mitral valve is normal in structure. Mild mitral valve regurgitation. No evidence of mitral valve stenosis. Tricuspid Valve: The tricuspid valve is normal in structure. Tricuspid valve regurgitation is mild to moderate. No evidence of tricuspid stenosis. Aortic Valve: The aortic valve is grossly normal. There is mild calcification of the aortic valve. Aortic valve regurgitation is trivial. No aortic stenosis is present. Pulmonic Valve: The pulmonic valve was grossly normal. Pulmonic valve regurgitation is mild to moderate. No evidence of pulmonic stenosis. Aorta: The aortic root is normal in size and structure. Venous: The inferior vena cava is dilated in size with less than 50% respiratory variability, suggesting right atrial pressure of 15 mmHg. IAS/Shunts: No atrial level shunt detected by color flow Doppler.  LEFT VENTRICLE PLAX 2D LVIDd:         4.30 cm     Diastology LVIDs:         3.55 cm     LV e' medial:    5.93 cm/s LV PW:         0.90 cm     LV E/e' medial:  7.2 LV IVS:        0.80 cm     LV e' lateral:   8.85 cm/s LVOT diam:     2.00 cm     LV E/e' lateral: 4.8 LV SV:         44 LV SV Index:   28 LVOT Area:     3.14 cm  LV Volumes (MOD) LV vol d, MOD A2C: 79.7 ml LV vol d, MOD A4C: 73.9 ml LV vol s, MOD A2C: 39.4 ml LV vol s, MOD A4C: 41.9 ml LV SV MOD A2C:     40.3 ml LV SV MOD A4C:     73.9 ml LV SV MOD BP:      36.8 ml RIGHT VENTRICLE             IVC RV S prime:     10.10 cm/s  IVC diam: 2.70 cm TAPSE (M-mode): 1.7 cm LEFT ATRIUM              Index        RIGHT ATRIUM           Index LA diam:        3.00 cm 1.94 cm/m   RA Area:     22.50 cm  LA Vol (A2C):   62.6 ml 40.38 ml/m  RA Volume:   73.00 ml  47.09 ml/m LA Vol (A4C):   34.2 ml 22.06 ml/m LA Biplane Vol: 47.8 ml 30.83 ml/m  AORTIC VALVE             PULMONIC VALVE LVOT Vmax:   93.80 cm/s  PR End Diast Vel: 2.21 msec LVOT Vmean:  58.300 cm/s LVOT VTI:    0.139 m  AORTA Ao Root diam: 3.40 cm Ao Asc diam:  3.40 cm MITRAL VALVE               TRICUSPID VALVE MV Area (PHT): 4.21 cm    TR Peak grad:   37.9 mmHg MV Decel Time: 180 msec    TR Vmax:        308.00 cm/s MV E velocity: 42.40 cm/s MV A velocity: 69.80 cm/s  SHUNTS MV E/A ratio:  0.61        Systemic VTI:  0.14 m                            Systemic Diam: 2.00 cm Cherlynn Kaiser MD Electronically signed by Cherlynn Kaiser MD Signature Date/Time: 09/05/2021/6:42:03 PM    Final         Scheduled Meds:  (feeding supplement) PROSource Plus  30 mL Oral BID BM   amiodarone  200 mg Oral Daily   arformoterol  15 mcg Nebulization BID   bisacodyl  10 mg Rectal Daily   budesonide (PULMICORT) nebulizer solution  0.5 mg Nebulization BID   busPIRone  10 mg Oral Daily   Chlorhexidine Gluconate Cloth  6 each Topical Daily   feeding supplement  237 mL Oral BID BM   guaiFENesin  1,200 mg Oral BID   loratadine  10 mg Oral Daily   mouth rinse  15 mL Mouth Rinse BID   multivitamin with minerals  1 tablet Oral Daily   pantoprazole  40 mg Oral Q0600   polyethylene glycol  17 g Oral BID   Rivaroxaban  15 mg Oral Q breakfast   Continuous Infusions:  sodium chloride 10 mL/hr at 09/05/21 1459   magnesium sulfate bolus IVPB 4 g (09/07/21 0852)   norepinephrine (LEVOPHED) Adult infusion Stopped (09/06/21 0806)   piperacillin-tazobactam (ZOSYN)  IV 12.5 mL/hr at 09/07/21 0800     LOS: 5 days    Time spent: 40 minutes    Irine Seal, MD [facility]   To contact the attending provider between 7A-7P or the covering provider  during after hours 7P-7A, please log into the web site [email or web address] and access using universal [place] password for that web site. If you do not have the password, please call the hospital operator.  09/07/2021, 9:23 AM

## 2021-09-07 NOTE — Progress Notes (Signed)
Progress Note  Patient Name: Kristen [name] Date of Encounter: 09/07/2021  Primary Cardiologist: Freada Bergeron, MD   Subjective   In interim, patient has had worsening hypotension leading to discontinuation of her beta blocker.   Patient notes no chest pain through her care here.  Started on amiodarone for AF supression.  Did not receive the 20 IV lasix in the setting of hypotension.  Inpatient Medications    Scheduled Meds:  (feeding supplement) PROSource Plus  30 mL Oral BID BM   amiodarone  200 mg Oral Daily   arformoterol  15 mcg Nebulization BID   bisacodyl  10 mg Rectal Daily   budesonide (PULMICORT) nebulizer solution  0.5 mg Nebulization BID   busPIRone  10 mg Oral Daily   Chlorhexidine Gluconate Cloth  6 each Topical Daily   feeding supplement  237 mL Oral BID BM   guaiFENesin  1,200 mg Oral BID   loratadine  10 mg Oral Daily   mouth rinse  15 mL Mouth Rinse BID   multivitamin with minerals  1 tablet Oral Daily   pantoprazole  40 mg Oral Q0600   polyethylene glycol  17 g Oral BID   Rivaroxaban  15 mg Oral Q breakfast   Continuous Infusions:  sodium chloride 10 mL/hr at 09/05/21 1459   magnesium sulfate bolus IVPB     norepinephrine (LEVOPHED) Adult infusion Stopped (09/06/21 0806)   piperacillin-tazobactam (ZOSYN)  IV 3.375 g (09/07/21 0532)   PRN Meds: acetaminophen **OR** acetaminophen, fluticasone, levalbuterol, mirtazapine, ondansetron **OR** ondansetron (ZOFRAN) IV, oxyCODONE-acetaminophen **AND** oxyCODONE, senna-docusate   Vital Signs    Vitals:   09/07/21 0500 09/07/21 0700 09/07/21 0747 09/07/21 0800  BP: (!) 90/57 (!) 98/55    Pulse: 79 84    Resp: (!) 22 (!) 25    Temp:      TempSrc:      SpO2: 100% 97% 97% 92%  Weight:      Height:        Intake/Output Summary (Last 24 hours) at 09/07/2021 0812 Last data filed at 09/07/2021 0400 Gross per 24 hour  Intake 488.62 ml  Output 1150 ml  Net -661.38 ml   Filed Weights   09/02/21 1000  09/02/21 1900 09/04/21 1000  Weight: 41.9 kg 41.9 kg 44 kg    Telemetry    SR PVCs- Personally Reviewed  ECG    No new- Personally Reviewed  Physical Exam   GEN: Frail elderly woman in no acute distress.   Neck: No JVD Cardiac:  regular rate and rhythm Respiratory: Clear to auscultation bilaterally with decreased breath sounds GI: Soft, nontender, non-distended  MS: no edema no change in LE wounds Neuro:  Nonfocal  Psych: Normal affect   Labs    Chemistry Recent Labs  Lab 09/01/21 1415 09/02/21 0500 09/05/21 0237 09/06/21 0237 09/07/21 0241  NA 131*   < > 130* 132* 128*  K 4.1   < > 4.4 4.4 4.0  CL 95*   < > 97* 97* 92*  CO2 30   < > _0 GLUCOSE 92   < > 110* 97 124*  BUN 20   < > _1 CREATININE 0.83   < > 0.70 0.61 0.61  CALCIUM 7.9*   < > 8.1* 8.1* 8.1*  PROT 6.1*  --   --   --   --   ALBUMIN 2.3*  --   --  2.1* 2.1*  AST 22  --   --   --   --  ALT 10  --   --   --   --   ALKPHOS 85  --   --   --   --   BILITOT 1.2  --   --   --   --   GFRNONAA >60   < > >60 >60 >60  ANIONGAP 6   < > _0 < > = values in this interval not displayed.     Hematology Recent Labs  Lab 09/05/21 0237 09/06/21 0237 09/07/21 0241  WBC 19.0* 17.3* 17.2*  RBC 3.04* 2.93* 3.10*  HGB 8.8* 8.3* 8.9*  HCT 27.9* 25.7* 27.5*  MCV 91.8 87.7 88.7  MCH 28.9 28.3 28.7  MCHC 31.5 32.3 32.4  RDW 14.6 14.5 14.6  PLT 517* 554* 552*    Cardiac EnzymesNo results for input(s): TROPONINI in the last 168 hours. No results for input(s): TROPIPOC in the last 168 hours.   BNP Recent Labs  Lab 09/04/21 0234  BNP 942.6*     DDimer No results for input(s): DDIMER in the last 168 hours.   Radiology    DG CHEST PORT 1 VIEW  Result Date: 09/05/2021 CLINICAL DATA:  Multilobar pneumonia EXAM: PORTABLE CHEST 1 VIEW COMPARISON:  09/04/2021 FINDINGS: The large area of infiltrate in the right upper lobe slightly improved. Bibasilar infiltrates unchanged. There is underlying  COPD. Small bilateral pleural effusions unchanged. IMPRESSION: Multi lobar infiltrate and small effusions, most likely pneumonia. Mild treatment right upper lobe infiltrate Electronically Signed   By: Franchot Gallo M.D.   On: 09/05/2021 11:40   ECHOCARDIOGRAM COMPLETE  Result Date: 09/05/2021    ECHOCARDIOGRAM REPORT   Patient Name:   Kristen [name] Date of Exam: 09/05/2021 Medical Rec #:  [id number]    Height:       71.0 in Accession #:    [id number]   Weight:       97.0 lb Date of Birth:  05/18/1946    BSA:          1.550 m Patient Age:    75 years     BP:           63/47 mmHg Patient Gender: F            HR:           88 bpm. Exam Location:  Inpatient Procedure: 2D Echo, 3D Echo, Cardiac Doppler, Color Doppler and Intracardiac            Opacification Agent Indications:    R06.02 SOB; I50.40* Unspecified combined systolic (congestive)                 and diastolic (congestive) heart failure  History:        Patient has prior history of Echocardiogram examinations, most                 recent 10/23/2020. Abnormal ECG, COPD, Arrythmias:Atrial                 Fibrillation, Signs/Symptoms:Chest Pain and Hypotension; Risk                 Factors:Current Smoker. Opiate use.  Sonographer:    Roseanna Rainbow RDCS Referring Phys: [place]  1. Left ventricular ejection fraction, by estimation, is 50%. The left ventricle has low normal function. The left ventricle demonstrates regional wall motion abnormalities (see scoring diagram/findings for description). Left ventricular diastolic parameters are indeterminate. There is the interventricular septum is flattened in  systole, consistent with right ventricular pressure overload.  2. Right ventricular systolic function is mildly reduced. The right ventricular size is moderate-severely enlarged. There is moderately elevated pulmonary artery systolic pressure. The estimated right ventricular systolic pressure is 34.7 mmHg.  3. Right atrial size was  moderately dilated.  4. The mitral valve is normal in structure. Mild mitral valve regurgitation. No evidence of mitral stenosis.  5. Tricuspid valve regurgitation is mild to moderate.  6. The aortic valve is grossly normal. There is mild calcification of the aortic valve. Aortic valve regurgitation is trivial. No aortic stenosis is present.  7. The inferior vena cava is dilated in size with <50% respiratory variability, suggesting right atrial pressure of 15 mmHg. Conclusion(s)/Recommendation(s): No left ventricular mural or apical thrombus/thrombi. 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 Left ventricular ejection fraction, by estimation, is 50%. The left ventricle has low normal function. The left ventricle demonstrates regional wall motion abnormalities. Definity contrast agent was  given IV to delineate the left ventricular endocardial borders. The left ventricular internal cavity size was normal in size. There is no left ventricular hypertrophy. The interventricular septum is flattened in systole, consistent with right ventricular pressure overload. Left ventricular diastolic parameters are indeterminate.  LV Wall Scoring: The apical lateral segment, apical septal segment, apical anterior segment, and apical inferior segment are hypokinetic. Right Ventricle: The right ventricular size is moderate-severely enlarged. No increase in right ventricular wall thickness. Right ventricular systolic function is mildly reduced. There is moderately elevated pulmonary artery systolic pressure. The tricuspid regurgitant velocity is 3.08 m/s, and with an assumed right atrial pressure of 15 mmHg, the estimated right ventricular systolic pressure is 42.5 mmHg. Left Atrium: Left atrial size was normal in size. Right Atrium: Right atrial size was moderately dilated. Pericardium: There is no evidence of pericardial effusion. Mitral Valve: The mitral valve is normal in structure. Mild mitral valve  regurgitation. No evidence of mitral valve stenosis. Tricuspid Valve: The tricuspid valve is normal in structure. Tricuspid valve regurgitation is mild to moderate. No evidence of tricuspid stenosis. Aortic Valve: The aortic valve is grossly normal. There is mild calcification of the aortic valve. Aortic valve regurgitation is trivial. No aortic stenosis is present. Pulmonic Valve: The pulmonic valve was grossly normal. Pulmonic valve regurgitation is mild to moderate. No evidence of pulmonic stenosis. Aorta: The aortic root is normal in size and structure. Venous: The inferior vena cava is dilated in size with less than 50% respiratory variability, suggesting right atrial pressure of 15 mmHg. IAS/Shunts: No atrial level shunt detected by color flow Doppler.  LEFT VENTRICLE PLAX 2D LVIDd:         4.30 cm     Diastology LVIDs:         3.55 cm     LV e' medial:    5.93 cm/s LV PW:         0.90 cm     LV E/e' medial:  7.2 LV IVS:        0.80 cm     LV e' lateral:   8.85 cm/s LVOT diam:     2.00 cm     LV E/e' lateral: 4.8 LV SV:         44 LV SV Index:   28 LVOT Area:     3.14 cm  LV Volumes (MOD) LV vol d, MOD A2C: 79.7 ml LV vol d, MOD A4C: 73.9 ml LV vol s, MOD A2C: 39.4 ml LV vol s,  MOD A4C: 41.9 ml LV SV MOD A2C:     40.3 ml LV SV MOD A4C:     73.9 ml LV SV MOD BP:      36.8 ml RIGHT VENTRICLE             IVC RV S prime:     10.10 cm/s  IVC diam: 2.70 cm TAPSE (M-mode): 1.7 cm LEFT ATRIUM             Index        RIGHT ATRIUM           Index LA diam:        3.00 cm 1.94 cm/m   RA Area:     22.50 cm LA Vol (A2C):   62.6 ml 40.38 ml/m  RA Volume:   73.00 ml  47.09 ml/m LA Vol (A4C):   34.2 ml 22.06 ml/m LA Biplane Vol: 47.8 ml 30.83 ml/m  AORTIC VALVE             PULMONIC VALVE LVOT Vmax:   93.80 cm/s  PR End Diast Vel: 2.21 msec LVOT Vmean:  58.300 cm/s LVOT VTI:    0.139 m  AORTA Ao Root diam: 3.40 cm Ao Asc diam:  3.40 cm MITRAL VALVE               TRICUSPID VALVE MV Area (PHT): 4.21 cm    TR Peak grad:    37.9 mmHg MV Decel Time: 180 msec    TR Vmax:        308.00 cm/s MV E velocity: 42.40 cm/s MV A velocity: 69.80 cm/s  SHUNTS MV E/A ratio:  0.61        Systemic VTI:  0.14 m                            Systemic Diam: 2.00 cm Cherlynn Kaiser MD Electronically signed by Cherlynn Kaiser MD Signature Date/Time: 09/05/2021/6:42:03 PM    Final      Patient Profile     75 y.o. female COPD on home O2 3 L, PAF on amiodarone and Xarelto, CHADSVASC 3, frailty with 5 admission into our system in 2022 and protein calorie malnutrition seen for RV failure, AF and for troponin elevation 09/04/21 (suspect demand ischemia from hypotension)  Assessment & Plan    PAF - 20 amiodarone PO if RVR can transition to IV - continue DOAC  Elevated troponin ([phone] 384) With apical wall motion abnormalities new from 10/2020 - cannot exclude small apical infarct - continued discussion from 09/06/21- she has had no symptoms and if she were to have had a small apical infarct she is amenable to conservative therapy, no change in hgb.  Will start plavix but will not pursue aggressive therapy  RV dysfunction and enlargement Protein calorie malnutrition - suspect related to COPD - cautious diuresis (would not diurese today) - PCCM following  We have begun conversations about what she would like her care to be long term; at this time she would not was invasive heart catheterization.  She recognizes that multiple admissions are a poor prognostic sign.  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   For questions or updates, please contact [place] Please consult [email or web address] for contact info under Cardiology/STEMI.      Signed, Werner Lean, MD  09/07/2021, 8:12 AM

## 2021-09-07 NOTE — Progress Notes (Addendum)
NAME:  Kristen [name], MRN:  [id number], DOB:  12/06/1945, LOS: 5 ADMISSION DATE:  09/01/2021, CONSULTATION DATE:  09/04/2021 REFERRING MD:  Dr. Grandville Silos, CHIEF COMPLAINT:  Resp failure   History of Present Illness:  75 year old female with past medical history significant for former smoker, COPD, chronic hypoxic respiratory failure on home O2 3L [place], HFpEF, Afib on Xarelto, venous/ arterial insufficiency, and protein calorie malnutrition admitted to [facility] on 10/10 with sepsis, hypoxic respiratory failure, and pneumonia.   She presented to ER due to progressive generalized weakness, right sided chest pain, chills, productive cough with greenish-brown sputum, and diarrhea x 5 days.   She reports she was trying to avoid coming to the hospital and waited but now regrets waiting.  Had increased her home O2 to 3.5 L mostly due to feeling anxious.  She denies any fever, hemoptysis, dysphagia, or choking episodes.  She is divorced, lives with her sister, and has two children who live in [place].   Found to be afebrile with with soft blood pressures, improved after albumin, and labs showing leukocytosis, Na 131, BNP 942, Flu/ SARs neg, negative urine legionella and strep, and CXR with new extensive consolidation in the lateral right midlung and right base c/w with pneumonia, chronic hyperexpansion, and small right pleural effusion.  She since has had an increasing O2 requirement, placed on high flow [place] but now with trial of BiPAP.  Started on low dose lasix this morning for volume overload.  She was treated in July 2022 for an aspiration pneumonia, therefore started on CAP coverage, changed to unasyn but coverage expanded to zosyn today given CXR progression.  No further diarrhea since admission.  This is her fifth hospitalization this year for respiratory failure related to COPD and/ or aspiration pneumonia.  She is a DNI.  PCCM consulted for further recommendations.  Pertinent  Medical History  Former smoker, COPD,  chronic hypoxic respiratory failure on home O2 3L , Afib on Xarelto, venous/ arterial insufficiency, protein calorie malnutrition, HFpEF, hyponatremia, COVID 05/2021  Significant Hospital Events: Including procedures, antibiotic start and stop dates in addition to other pertinent events   10/10 admitted with sepsis, pna 10/13 PCCM consulted  10/14 required intermittent levophed peripherally  Interim History / Subjective:   No acute events overnight. Patient turned down to 5L supplemental O2 this morning with SpO2 in the low to mid 90s.   She denies any complaints at this time.  She has intermittently low BP, asymptomatic.  Objective   Blood pressure (!) 98/55, pulse 84, temperature 97.6 F (36.4 C), temperature source Oral, resp. rate (!) 25, height _0  (1.803 m), weight 44 kg, SpO2 92 %.        Intake/Output Summary (Last 24 hours) at 09/07/2021 0805 Last data filed at 09/07/2021 0400 Gross per 24 hour  Intake 488.62 ml  Output 1150 ml  Net -661.38 ml   Filed Weights   09/02/21 1000 09/02/21 1900 09/04/21 1000  Weight: 41.9 kg 41.9 kg 44 kg   Examination: General:  frail elderly woman, no acute distress, receiving nebulizer treatment HEENT: moist mucous membranes, sclera anicteric Neuro: Alert, oriented, moving all extremities CV: rrr, no murmurs PULM:  improved breath sounds on right, no wheezing. HFNC in place GI: soft, bs+, NT, ND Extremities: warm/dry, no edema Skin: no rashes   Resolved Hospital Problem list    Assessment & Plan:  Septic Shock, resolving - weaned off levophed 10/15, intermittently low Bps, will start midodrine 63m TID - Continue  zosyn - MRSA screen negative. Urine legionella and strep negative - Fluid resuscitation PRN - random cortisol 16, less concern for adrenal insufficiency  Acute on Chronic Hypoxemic Respiratory Failure ?Pulmonary Hypertension Pneumonia COPD, 3L O2 baseline - Continue nebulizer treatments - Continue  antibiotics as above - PRN Bipap - Continue HFNC, wean for goal SpO2 90-92% - Will need follow up imaging in 6-8 weeks for monitoring of resolution of the dense right upper lobe pneumonia - Bronchial hygiene with nebs, incentive spirometer and flutter valve - Echo concerning for pulmonary hypertension, likely in setting of group III disease with chronic respiratory failure. She should have sleep study performed in outpatient setting.   PCCM will continue to follow  Remained per primary service Diarrhea- resolved PAF- on xarelto, in NSR Prolonged Qtc Chronic hyponatremia Moderate protein calorie malnutrition   Best Practice (right click and "Reselect all SmartList Selections" daily)   Diet/type: Regular consistency (see orders) DVT prophylaxis: DOAC GI prophylaxis: PPI Lines: N/A Foley:  N/A Code Status:  limited Last date of multidisciplinary goals of care discussion [per primary]  Labs   CBC: Recent Labs  Lab 09/01/21 1415 09/02/21 0500 09/03/21 0545 09/04/21 0234 09/05/21 0237 09/06/21 0237 09/07/21 0241  WBC 17.5*   < > 20.2* 20.0* 19.0* 17.3* 17.2*  NEUTROABS 14.9*  --   --   --  15.3* 13.3* 13.5*  HGB 11.1*   < > 10.2* 8.8* 8.8* 8.3* 8.9*  HCT 35.4*   < > 31.0* 27.3* 27.9* 25.7* 27.5*  MCV 91.2   < > 87.8 89.8 91.8 87.7 88.7  PLT 501*   < > 567* 507* 517* 554* 552*   < > = values in this interval not displayed.    Basic Metabolic Panel: Recent Labs  Lab 09/03/21 0545 09/04/21 0234 09/05/21 0237 09/06/21 0237 09/07/21 0241  NA 130* 135 130* 132* 128*  K 3.8 3.1* 4.4 4.4 4.0  CL 98 101 97* 97* 92*  CO2 _0 GLUCOSE 95 119* 110* 97 124*  BUN _1 CREATININE 0.65 0.56 0.70 0.61 0.61  CALCIUM 7.8* 8.6* 8.1* 8.1* 8.1*  MG 1.1* 1.7 1.8 1.7 1.7  PHOS  --   --   --  2.5 2.7   GFR: Estimated Creatinine Clearance: 41.6 mL/min (by C-G formula based on SCr of 0.61 mg/dL). Recent Labs  Lab 09/01/21 1402 09/01/21 1415 09/01/21 1601  09/02/21 0500 09/04/21 0234 09/05/21 0237 09/06/21 0237 09/07/21 0241  WBC  --    < >  --    < > 20.0* 19.0* 17.3* 17.2*  LATICACIDVEN 1.9  --  0.9  --   --   --   --   --    < > = values in this interval not displayed.    Liver Function Tests: Recent Labs  Lab 09/01/21 1415 09/06/21 0237 09/07/21 0241  AST 22  --   --   ALT 10  --   --   ALKPHOS 85  --   --   BILITOT 1.2  --   --   PROT 6.1*  --   --   ALBUMIN 2.3* 2.1* 2.1*   No results for input(s): LIPASE, AMYLASE in the last 168 hours. No results for input(s): AMMONIA in the last 168 hours.  ABG    Component Value Date/Time   PHART 7.394 09/04/2021 0811   PCO2ART 44.3 09/04/2021 0811   PO2ART 58.5 (L) 09/04/2021 0811   HCO3  26.4 09/04/2021 0811   TCO2 42 (H) 02/22/2021 1655   ACIDBASEDEF 2.0 06/28/2020 1750   O2SAT 89.6 09/04/2021 0811     Coagulation Profile: Recent Labs  Lab 09/01/21 1415  INR 1.9*    Cardiac Enzymes: No results for input(s): CKTOTAL, CKMB, CKMBINDEX, TROPONINI in the last 168 hours.  HbA1C: Hgb A1c MFr Bld  Date/Time Value Ref Range Status  06/29/2020 04:27 PM 5.6 4.8 - 5.6 % Final    Comment:    (NOTE) Pre diabetes:          5.7%-6.4%  Diabetes:              >6.4%  Glycemic control for   <7.0% adults with diabetes     CBG: No results for input(s): GLUCAP in the last 168 hours.  Critical care time: n/a    Freda Jackson, MD [facility] Office: [phone]   See Amion for personal pager PCCM on call pager [phone] until 7pm. Please call [place] 7p-7a. [phone]

## 2021-09-07 NOTE — Evaluation (Signed)
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 Medical history significant of COPD with chronic hypoxic respiratory failure on 3 L/min oxygen at baseline, chronic A-fib, venous insufficiency, hx of transient hypotension, hospital admission in July of this year for aspiration pneumonia and acute on chronic hypoxic respiratory failure.    Assessment / Plan / Recommendation  Clinical Impression  Pt was seen for a bedside swallow evaluation and she presents with minimal oral dysphagia secondary to missing dentition.  Pt reported that she is in the process of obtaining a new top plate.  Pt consumed trials of thin liquid and regular solids.  Mastication of regular solids was mildly prolonged secondary to dentition, but if was effective and pt achieved good oral clearance.  No overt s/sx of aspiration were observed with any PO trials on this date.  Recommend continuation of regular solids and thin liquids with medications administered whole with liquid.  If pt continues to be re-admitted with PNA, a modified barium swallow study may be  beneficial to r/o silent aspiration.  No further skilled ST warranted at this time.  Please re-consult if additional needs arise.  SLP Visit Diagnosis: Dysphagia, oral phase (R13.11)    Aspiration Risk  Mild aspiration risk    Diet Recommendation Regular;Thin liquid   Liquid Administration via: Cup;Straw Medication Administration: Whole meds with liquid Supervision: Patient able to self feed Compensations: Small sips/bites;Slow rate    Other  Recommendations Oral Care Recommendations: Oral care BID    Recommendations for follow up therapy are one component of a multi-disciplinary discharge planning process, led by the attending physician.  Recommendations may be updated based on patient status, additional functional criteria and insurance authorization.  Follow up Recommendations None        Swallow Study   General HPI: Pt is 75 yo female who presented to [facility] ED from home by EMS due to ongoing diarrhea x 5 days, productive cough with associated right-sided chest pain and chills, with progressive generalized weakness. Medical history significant of COPD with chronic hypoxic respiratory failure on 3 L/min oxygen at baseline, chronic A-fib, venous insufficiency, hx of transient hypotension, hospital admission in July of this year for aspiration pneumonia and acute on chronic hypoxic respiratory failure. Type of Study: Bedside Swallow Evaluation Previous Swallow Assessment: N/A Diet Prior to this Study: Regular;Thin liquids Temperature Spikes Noted: No Respiratory Status: Nasal cannula History of Recent Intubation: No Behavior/Cognition: Alert;Cooperative;Pleasant mood Oral Cavity Assessment: Within Functional Limits Oral Care Completed by SLP: No Oral Cavity - Dentition: Missing dentition (edentulous top, missing dentition bottom) Vision: Functional for self-feeding  Self-Feeding Abilities: Able to feed self Patient Positioning: Upright in bed Baseline Vocal Quality:  Normal Volitional Swallow: Able to elicit    Oral/Motor/Sensory Function Overall Oral Motor/Sensory Function: Within functional limits   Ice Chips Ice chips: Not tested   Thin Liquid Thin Liquid: Within functional limits    Nectar Thick Nectar Thick Liquid: Not tested   Honey Thick Honey Thick Liquid: Not tested   Puree Puree: Not tested   Solid     Solid: Impaired Presentation: Self Fed Oral Phase Functional Implications: Prolonged oral transit     Colin Mulders M.S., CCC-SLP Acute Rehabilitation Services Office: [phone]  Elvia Collum Riane Rung 09/07/2021,2:22 PM

## 2021-09-08 DIAGNOSIS — J9611 Chronic respiratory failure with hypoxia: Secondary | ICD-10-CM | POA: Diagnosis not present

## 2021-09-08 DIAGNOSIS — J690 Pneumonitis due to inhalation of food and vomit: Secondary | ICD-10-CM | POA: Diagnosis not present

## 2021-09-08 DIAGNOSIS — J189 Pneumonia, unspecified organism: Secondary | ICD-10-CM | POA: Diagnosis not present

## 2021-09-08 DIAGNOSIS — I480 Paroxysmal atrial fibrillation: Secondary | ICD-10-CM | POA: Diagnosis not present

## 2021-09-08 DIAGNOSIS — J449 Chronic obstructive pulmonary disease, unspecified: Secondary | ICD-10-CM | POA: Diagnosis not present

## 2021-09-08 DIAGNOSIS — Z7189 Other specified counseling: Secondary | ICD-10-CM

## 2021-09-08 DIAGNOSIS — A419 Sepsis, unspecified organism: Secondary | ICD-10-CM | POA: Diagnosis not present

## 2021-09-08 DIAGNOSIS — J9601 Acute respiratory failure with hypoxia: Secondary | ICD-10-CM | POA: Diagnosis not present

## 2021-09-08 LAB — CULTURE, RESPIRATORY W GRAM STAIN: Culture: NORMAL

## 2021-09-08 LAB — BASIC METABOLIC PANEL
Anion gap: 6 (ref 5–15)
BUN: 16 mg/dL (ref 8–23)
CO2: 31 mmol/L (ref 22–32)
Calcium: 7.9 mg/dL — ABNORMAL LOW (ref 8.9–10.3)
Chloride: 91 mmol/L — ABNORMAL LOW (ref 98–111)
Creatinine, Ser: 0.7 mg/dL (ref 0.44–1.00)
GFR, Estimated: >60 mL/min (ref >60)
Glucose, Bld: 81 mg/dL (ref 70–99)
Potassium: 4.6 mmol/L (ref 3.5–5.1)
Sodium: 128 mmol/L — ABNORMAL LOW (ref 135–145)

## 2021-09-08 LAB — CBC WITH DIFFERENTIAL/PLATELET
Abs Immature Granulocytes: 0.24 10*3/uL — ABNORMAL HIGH (ref 0.00–0.07)
Basophils Absolute: 0 10*3/uL (ref 0.0–0.1)
Basophils Relative: 0 %
Eosinophils Absolute: 0.3 10*3/uL (ref 0.0–0.5)
Eosinophils Relative: 2 %
HCT: 26 % — ABNORMAL LOW (ref 36.0–46.0)
Hemoglobin: 8.3 g/dL — ABNORMAL LOW (ref 12.0–15.0)
Immature Granulocytes: 1 %
Lymphocytes Relative: 11 %
Lymphs Abs: 2.1 10*3/uL (ref 0.7–4.0)
MCH: 28.1 pg (ref 26.0–34.0)
MCHC: 31.9 g/dL (ref 30.0–36.0)
MCV: 88.1 fL (ref 80.0–100.0)
Monocytes Absolute: 1.2 10*3/uL — ABNORMAL HIGH (ref 0.1–1.0)
Monocytes Relative: 7 %
Neutro Abs: 14.7 10*3/uL — ABNORMAL HIGH (ref 1.7–7.7)
Neutrophils Relative %: 79 %
Platelets: 572 10*3/uL — ABNORMAL HIGH (ref 150–400)
RBC: 2.95 MIL/uL — ABNORMAL LOW (ref 3.87–5.11)
RDW: 14.5 % (ref 11.5–15.5)
WBC: 18.6 10*3/uL — ABNORMAL HIGH (ref 4.0–10.5)
nRBC: 0 % (ref 0.0–0.2)

## 2021-09-08 LAB — MAGNESIUM: Magnesium: 1.9 mg/dL (ref 1.7–2.4)

## 2021-09-08 NOTE — Progress Notes (Signed)
Patient has remained hypotensive with SBP currently in 70's. Upon assessment by this RN, pt denies dizziness or SOB. Other VS remain WNL at this time. Pt is currently on PO midodrine. Dr Erin Fulling notified of BP by this RN; no new orders at this time d/t patient remaining asymptomatic. This RN will continue to carefully monitor BP and assess for symptoms.

## 2021-09-08 NOTE — Progress Notes (Signed)
Progress Note  Patient Name: Kristen [name] Date of Encounter: 09/08/2021  [facility] Cardiologist: Freada Bergeron, MD   Subjective   75 yo with COPD, PAF, admitted with malnutrition, hypotension ,  aspiration pneumonia with sepsis , RV heart failure,  Patient is a DNR, no CPR ,    Inpatient Medications    Scheduled Meds:  (feeding supplement) PROSource Plus  30 mL Oral BID BM   acetaminophen  500 mg Oral TID   amiodarone  200 mg Oral Daily   arformoterol  15 mcg Nebulization BID   bisacodyl  10 mg Rectal Daily   budesonide (PULMICORT) nebulizer solution  0.5 mg Nebulization BID   busPIRone  10 mg Oral Daily   Chlorhexidine Gluconate Cloth  6 each Topical Daily   clopidogrel  75 mg Oral Daily   feeding supplement  237 mL Oral BID BM   fluticasone  2 spray Each Nare Daily   guaiFENesin  1,200 mg Oral BID   loratadine  10 mg Oral Daily   mouth rinse  15 mL Mouth Rinse BID   midodrine  5 mg Oral TID WC   multivitamin with minerals  1 tablet Oral Daily   pantoprazole  40 mg Oral Q0600   polyethylene glycol  17 g Oral BID   Rivaroxaban  15 mg Oral Q breakfast   Continuous Infusions:  sodium chloride 10 mL/hr at 09/05/21 1459   piperacillin-tazobactam (ZOSYN)  IV 3.375 g (09/08/21 0804)   PRN Meds: acetaminophen **OR** acetaminophen, levalbuterol, mirtazapine, ondansetron **OR** ondansetron (ZOFRAN) IV, oxyCODONE, senna-docusate   Vital Signs    Vitals:   09/08/21 0300 09/08/21 0700 09/08/21 0809 09/08/21 0816  BP:  (!) 86/49 109/63   Pulse:  72 79 78  Resp:  _0 Temp: 98 F (36.7 C)     TempSrc: Oral     SpO2:  100% 100% 96%  Weight:      Height:        Intake/Output Summary (Last 24 hours) at 09/08/2021 0826 Last data filed at 09/07/2021 1800 Gross per 24 hour  Intake 105.98 ml  Output 800 ml  Net -694.02 ml   Last 3 Weights 09/04/2021 09/02/2021 09/02/2021  Weight (lbs) 97 lb 92 lb 6 oz 92 lb 6 oz  Weight (kg) 44 kg 41.9 kg 41.9 kg       Telemetry     Sinus rhythm - Personally Reviewed  ECG     - Personally Reviewed  Physical Exam   GEN:  elderly , frail female Neck: No JVD Cardiac: RRR, no murmurs, rubs, or gallops.  Respiratory: markedly reduced breath sounds  GI: Soft, nontender, non-distended  MS: No edema; No deformity. Neuro:  Nonfocal  Psych: Normal affect   Labs    High Sensitivity Troponin:   Recent Labs  Lab 09/04/21 1020 09/04/21 1134  TROPONINIHS 407* 384*     Chemistry Recent Labs  Lab 09/01/21 1415 09/02/21 0500 09/06/21 0237 09/07/21 0241 09/08/21 0254  NA 131*   < > 132* 128* 128*  K 4.1   < > 4.4 4.0 4.6  CL 95*   < > 97* 92* 91*  CO2 30   < > _1 GLUCOSE 92   < > 97 124* 81  BUN 20   < > _2 CREATININE 0.83   < > 0.61 0.61 0.70  CALCIUM 7.9*   < > 8.1* 8.1* 7.9*  MG  --    < >  1.7 1.7 1.9  PROT 6.1*  --   --   --   --   ALBUMIN 2.3*  --  2.1* 2.1*  --   AST 22  --   --   --   --   ALT 10  --   --   --   --   ALKPHOS 85  --   --   --   --   BILITOT 1.2  --   --   --   --   GFRNONAA >60   < > >60 >60 >60  ANIONGAP 6   < > _0 < > = values in this interval not displayed.    Lipids No results for input(s): CHOL, TRIG, HDL, LABVLDL, LDLCALC, CHOLHDL in the last 168 hours.  Hematology Recent Labs  Lab 09/06/21 0237 09/07/21 0241 09/08/21 0254  WBC 17.3* 17.2* 18.6*  RBC 2.93* 3.10* 2.95*  HGB 8.3* 8.9* 8.3*  HCT 25.7* 27.5* 26.0*  MCV 87.7 88.7 88.1  MCH 28.3 28.7 28.1  MCHC 32.3 32.4 31.9  RDW 14.5 14.6 14.5  PLT 554* 552* 572*   Thyroid No results for input(s): TSH, FREET4 in the last 168 hours.  BNP Recent Labs  Lab 09/04/21 0234  BNP 942.6*    DDimer No results for input(s): DDIMER in the last 168 hours.   Radiology    No results found.  Cardiac Studies      Patient Profile     75 y.o. female with COPD , chronic aspiration , PAF  Admitted with aspiration pneumonia, sepsis , atrial fib   Assessment & Plan    PAF :   is  maintaining Sinus rhythm.  Cont amiodarone Continue xarelto 15 mg a day  Is tolerating amiodarone well.   2.   Aspiration pneumonia:   pt has known aspiration pneumonia.   I saw her during a previous hospitalization in July when she had aspiration pneumonia . Cont management per [facility] .   3.  Elevated troponins:   likely demand ischemia given her hypoxemia, sepsis, pneumonia  She is a very poor candidate for invasive procedures.    Continue empiric therapy with plavix  No plans for invasive evaluation at this time   4. Pulmonary HTN:  echo shows evidence of moderate pulmonary HTN with RV overload .  This is due to her severe chronic lung disease with superimposed with hypoxemia from her aspiration pneumonia .  Further treatment per [facility] team .   [facility] will sign off.   Medication Recommendations:  cont current meds.  Other recommendations (labs, testing, etc):   Follow up as an outpatient:   with Dr. Johney Frame as OP   Her long term prognosis is very poor .  She has had multiple admissions this year for respiratory failure related to her COPD and aspiration pneumonia .    For questions or updates, please contact [place] Please consult [email or web address] for contact info under        Signed, Mertie Moores, MD  09/08/2021, 8:26 AM

## 2021-09-08 NOTE — Progress Notes (Signed)
Physical Therapy Treatment Patient Details Name: Kristen [name] MRN: [id number] DOB: [birth year] Today's Date: 09/08/2021   History of Present Illness Pt is 75 yo female who presented to [facility] ED from home by EMS due to ongoing diarrhea x 5 days, productive cough with associated right-sided chest pain and chills, with progressive generalized weakness. Medical history significant of COPD with chronic hypoxic respiratory failure on 3 L/min oxygen at baseline, chronic A-fib, venous insufficiency, hx of transient hypotension, hospital admission in July of this year for aspiration pneumonia and acute on chronic hypoxic respiratory failure.    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  Pt is on 3 lts at home.  Assisted OOB to [facility] first.  General bed mobility comments: increased ability to self perform with increased time.  Fatigues easily.General transfer comment: 50% VC's on proper hand placement to avoid pulling up on walker.  Assisted from elevated bed to [facility] then from [facility] to standing.General Gait Details: pt stood from [facility] then declined to attempt amb.  "Not up for it right now" so switch BSC with recliner from behind as pt stood with [place] in recliner to comfort.  Avg sats 94% and HR 82.   Pt plans to return home.    Recommendations for follow up therapy are one component of a multi-disciplinary discharge planning process, led by the attending physician.  Recommendations may be updated based on patient status, additional functional criteria and insurance authorization.  Follow Up Recommendations  Home health PT     Equipment Recommendations  None recommended by PT    Recommendations for Other Services       Precautions / Restrictions Precautions Precautions: Fall Precaution Comments: monitor O2 and HR     Mobility  Bed Mobility Overal bed mobility: Needs Assistance Bed Mobility: Supine to Sit      Supine to sit: Min assist     General bed mobility comments: increased ability to self perform with increased time.  Fatigues easily.    Transfers Overall transfer level: Needs assistance Equipment used: Rolling walker (2 wheeled) Transfers: Sit to/from Stand Sit to Stand: Min assist Stand pivot transfers: Min assist       General transfer comment: 50% VC's on proper hand placement to avoid pulling up on walker.  Assisted from elevated bed to [facility] then from [facility] to standing.  Ambulation/Gait             General Gait Details: pt stood from [facility] then declined to attempt amb.  "Not up for it right now" so switch BSC with recliner from behind as pt stood with [employer].   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      Exercises      General Comments        Pertinent Vitals/Pain Pain Assessment: Faces Faces Pain Scale: Hurts little more Pain Location: Chronic Back Pain Descriptors / Indicators: Discomfort;Aching Pain Intervention(s): Monitored during session    Home Living                      Prior Function            PT Goals (current goals can now be found in the care plan section) Progress towards PT goals: Progressing toward goals    Frequency    Min 3X/week      PT Plan Current plan remains appropriate    Co-evaluation              AM-PAC PT "6 Clicks" Mobility   Outcome Measure  Help needed turning from your back to your side while in a flat bed without using bedrails?: A Little Help needed moving from lying on your back  to sitting on the side of a flat bed without using bedrails?: A Little Help needed moving to and from a bed to a chair (including a wheelchair)?: A Little Help needed standing up from a chair using your arms (e.g., wheelchair or bedside chair)?: A Little Help needed to walk in hospital room?: A Lot Help needed climbing 3-5 steps with a railing? : A Lot 6 Click Score: 16    End of Session Equipment Utilized During Treatment: Gait belt Activity Tolerance: Patient limited by fatigue Patient left: in chair;with chair alarm set;with call bell/phone within reach Nurse Communication: Mobility status PT Visit Diagnosis: Muscle weakness (generalized) (M62.81)     Time: 3846-6599 PT Time Calculation (min) (ACUTE ONLY): 25 min  Charges:  $Therapeutic Activity: 23-37 mins                     Rica Koyanagi  PTA Acute  Rehabilitation Services Pager      [phone] Office      [phone]

## 2021-09-08 NOTE — Progress Notes (Signed)
PROGRESS NOTE    Kristen [name]  VFI:[id number] DOB: February 27, 1946 DOA: 09/01/2021 PCP: [name], MD   No chief complaint on file.   Brief Narrative:  Kristen [name] is a 75 y.o. female with medical history significant of COPD with chronic hypoxic respiratory failure on 3 L/min oxygen at baseline, chronic A-fib, venous insufficiency, hx of transient hypotension, hospital admission in July of this year for aspiration pneumonia and acute on chronic hypoxic respiratory failure.  She was brought to [facility] ED from home by EMS due to ongoing diarrhea x 5 days, productive cough with associated right-sided chest pain and chills, with progressive generalized weakness.  Denies abdominal pain, nausea, vomiting, dysuria, focal weakness numbness or tingling, headaches or other recent symptoms.  Reports increasing her oxygen briefly to 3.5 L/min mostly due to feeling anxious.  Otherwise has been on her usual 3 L/min. Chest xray showed new extensive consolidation in the lateral right middle and lower lung, consistent with PNA, small right pleural effusion and chronic hyperexpansion compatible with emphysema. Patient is followed by the IM residency service at [facility], but this states she would prefer to be admitted here to [facility] this time.   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 -On admission patient noted to have met criteria for sepsis with leukocytosis, tachypnea, pneumonia. -Patient with a rising leukocytosis however started to slowly trend back down with broadening of IV antibiotic coverage..   -White count today at 17.2 from 17.3 from 19.0 from 20.  -Patient with some improvement with O2 requirements currently on 5 L high flow nasal cannula with sats of 92-94%.     -Blood cultures pending with no growth to date x5 days, MRSA PCR negative.  Urine cultures with multiple species. -Sputum gram stain and culture with no organisms seen. -Improving clinically respiratory wise -IV antibiotics broadened from Unasyn to IV Zosyn which we will continue for now. -Continue Brovana, Pulmicort, scheduled Xopenex and Atrovent nebs, Mucinex, Claritin.  Add Flonase. -Supportive care.  2.  Hypotension -Patient with systolic blood pressures noted to be low in the 80s on 09/03/2021.   -Random cortisol 16.8 on 09/05/2021. -IV albumin every 6 hours x24 hours given with some improvement with blood pressure however blood pressure was soft on 09/05/2021 and patient became hypotensive requiring Levophed pressor which has been weaned off as of 09/06/2021.   -Patient started on midodrine per PCCM. -Systolic blood pressures in the low 100s. -Diuretics discontinued.   -Supportive care.   -PCCM following.    3.  Acute on chronic hypoxic respiratory failure in the setting of COPD likely secondary to aspiration pneumonia and probable volume overload/acute diastolic CHF exacerbation/abnormal 2D echo -Likely secondary to pneumonia/aspiration pneumonia in the setting of possible volume overload. -Patient with use of accessory muscles of respiration the morning of 09/04/2021,  with complaints of shortness of breath and currently on 15 L high flow nasal cannula with sats of 97% on 09/05/2021. -Currently on  5 L high flow nasal cannula with sats 92-94 %. -Improved clinically since 09/04/2021 after trial of BiPAP, broadening of antibiotics. -BNP obtained at 942.6. -2D echo from 10/23/2020 with a EF of 55 to 27%,CWCB, grade 1 diastolic dysfunction, interventricular septum flattened in diastole consistent with right ventricular volume overload, low normal right ventricular systolic function, right ventricular size normal. -2D echo from 09/05/2021 with EF of 50%, left ventricle with wall motion abnormalities, left ventricular diastolic parameters indeterminate, interventricular septum is flattened in systole consistent with right ventricular pressure overload, mildly reduced right ventricular systolic function, right ventricular size is moderately to severely enlarged, moderately elevated pulmonary artery systolic pressure, right atrial size moderately dilated. -Patient on home O2 with a baseline of 3 L nasal cannula. -Blood pressure improved post IV albumin however patient remained hypotensive on 09/05/2021 requiring placement on pressors/Levophed.  -Levophed pressors weaned off 09/06/2021. -Repeat chest x-ray from 09/04/2021 with increased confluent opacity over the right upper lobe with worsening variation throughout remainder of right lung concerning for worsening pneumonia with a persistent parapneumonic effusion.   -Repeat chest x-ray from 09/05/2021 with multilobular infiltrate and small effusions, most likely pneumonia, mild treatment right upper lobe infiltrate.  -ABG with a pH of 7.394, PCO2 of 44, PO2 of 58.5. -Status post Lasix 20 mg IV x1 on 09/04/2021 with urine output of 800 cc recorded.   -Blood pressure soft and patient became hypotensive during the day on 09/05/2021 requiring pressor support which has been weaned off and now on midodrine -Diuretics discontinued. -Strict I's and O's.  -BiPAP as needed.  -Continue scheduled Xopenex and Atrovent nebs, Pulmicort, Brovana, Claritin, PPI, Flonase,  IV Zosyn. -Cardiology following. -PCCM following and appreciate their input and recommendations.  -May benefit from sleep study in the outpatient setting on discharge.  4.  Elevated troponin/abnormal 2D echo -Patient with acute on chronic respiratory distress on 09/04/2021, noted to have elevated BNP with concerns for volume overload.  Cardiac enzymes were elevated. -2D echo with EF of 50%, left ventricle with wall motion abnormalities, left ventricular diastolic parameters indeterminate, interventricular septum is flattened in systole consistent with right ventricular pressure overload, mildly reduced right ventricular systolic function, right ventricular size is moderately to severely enlarged, moderately elevated pulmonary artery systolic pressure, right atrial size moderately dilated. -Patient seen in consultation by cardiology this point in time suspect no need for further evaluation given her comorbid illness and frailty. -Patient noted to be hypotensive was on pressors which have been weaned off.  Hold off on starting beta-blocker.  -Currently on midodrine. -Per cardiology.    5.  Diarrhea>>>.  Constipation -Patient had reported 5 days of diarrhea at home prior to admission. -No bowel movement since admission. -Patient with complaints of constipation. -Received a dose of sorbitol on 09/05/2021 with no results.   -Patient placed on daily Dulcolax suppositories, MiraLAX twice daily with bowel movement noted 09/06/2021, 09/07/2021.   -Continue current bowel regimen.    6.  Paroxysmal atrial fibrillation -Continue amiodarone for rate control.   -Xarelto for anticoagulation.   -Cardiology following.  7.  Prolonged QTC -Patient noted to have a QTC of 560 on admission, chronically on amiodarone. -Repeat EKG with resolution of QT prolongation currently at 437. -Keep potassium approximately at 4, magnesium approximately at 2.  8.  Chronic hyponatremia -At baseline sodium 128.    9.   Chronic  venous stasis of bilateral lower extremities/venous insufficiency with ulceration -Continue current wound treatment recommendations per [place] RN  10.  Hypomagnesemia/hypokalemia -Magnesium at 1.9, potassium at 4.6.   -Follow.    11.  Moderate protein calorie malnutrition -Patient cachectic and frail on examination. -Albumin level at 2.3 on admission. -Diet liberalized.  -Status post IV albumin every 6 hours x1 day.   -Albumin currently at 2.1.   -Continue Ensure nutritional supplementation.   -RD consulted.    12.  Goals of care -It is noted that palliative care was to follow-up with patient after recent discharge, patient is a DO NOT INTUBATE.  Patient does express desire for no CPR as she feels could break her ribs.  Patient is accepting of ACLS medications if needed.  CODE STATUS has been changed to limited code.   -Palliative care consultation pending.             DVT prophylaxis: Xarelto Code Status: Partial code Family Communication: Updated patient.  No family at bedside. Disposition:   Status is: Inpatient  Remains inpatient appropriate because:Inpatient level of care appropriate due to severity of illness  Dispo: The patient is from: Home              Anticipated d/c is to:  TBD              Patient currently is not medically stable to d/c.   Difficult to place patient No       Consultants:  WOC Phineas Douglas, RN 09/02/2021 PCCM: Dr.Olalere 09/04/2021 Cardiology: Dr. Percival Spanish 09/04/2021  Procedures:  Chest x-ray 09/01/2021, 09/04/2021 2D echo 09/05/2021   Antimicrobials: IV Unasyn 09/01/2021>>>>> 09/04/2021 IV azithromycin 10/10 /2022x1 dose IV Rocephin 10/10/ 2022x1 dose IV Zosyn 09/04/2021>>>>>   Subjective: Sleeping but arousable.  Feels shortness of breath is improving.  Stated had a good breakfast this morning.  No chest pain.  No abdominal pain.  Stated had bowel movement yesterday.  On 5 L high flow nasal cannula with sats of 92 to  93%.  Objective: Vitals:   09/08/21 0828 09/08/21 0830 09/08/21 0900 09/08/21 0917  BP:   121/71   Pulse:   82 83  Resp:    17  Temp:      TempSrc:      SpO2: 95% 96% (!) 88% 95%  Weight:      Height:        Intake/Output Summary (Last 24 hours) at 09/08/2021 1004 Last data filed at 09/08/2021 0630 Gross per 24 hour  Intake 30.85 ml  Output 1500 ml  Net -1469.15 ml    Filed Weights   09/02/21 1000 09/02/21 1900 09/04/21 1000  Weight: 41.9 kg 41.9 kg 44 kg    Examination:  General exam: Cachectic.  Frail. Respiratory system: CTA B.  No wheezes, no crackles, no rhonchi.  Fair air movement.  Speaking in full sentences.  No use of accessory muscles with respiration.  Cardiovascular system: RRR no murmurs rubs or gallops.  No JVD.  No lower extremity edema.  Gastrointestinal system: Abdomen is soft, mildly distended, nontender, positive bowel sounds.  No rebound.  No guarding.  Central nervous system: Alert and oriented.  Moving extremities spontaneously.  No focal neurological deficits.  Extremities: Symmetric 5 x 5 power. Skin: No rashes, lesions or ulcers Psychiatry: Judgement and insight appear normal. Mood & affect appropriate.     Data Reviewed: I have personally reviewed following labs and imaging studies  CBC: Recent Labs  Lab 09/01/21 1415 09/02/21  0500 09/04/21 0234 09/05/21 0237 09/06/21 0237 09/07/21 0241 09/08/21 0254  WBC 17.5*   < > 20.0* 19.0* 17.3* 17.2* 18.6*  NEUTROABS 14.9*  --   --  15.3* 13.3* 13.5* 14.7*  HGB 11.1*   < > 8.8* 8.8* 8.3* 8.9* 8.3*  HCT 35.4*   < > 27.3* 27.9* 25.7* 27.5* 26.0*  MCV 91.2   < > 89.8 91.8 87.7 88.7 88.1  PLT 501*   < > 507* 517* 554* 552* 572*   < > = values in this interval not displayed.     Basic Metabolic Panel: Recent Labs  Lab 09/04/21 0234 09/05/21 0237 09/06/21 0237 09/07/21 0241 09/08/21 0254  NA 135 130* 132* 128* 128*  K 3.1* 4.4 4.4 4.0 4.6  CL 101 97* 97* 92* 91*  CO2 _0 GLUCOSE 119* 110* 97 124* 81  BUN _1 CREATININE 0.56 0.70 0.61 0.61 0.70  CALCIUM 8.6* 8.1* 8.1* 8.1* 7.9*  MG 1.7 1.8 1.7 1.7 1.9  PHOS  --   --  2.5 2.7  --      GFR: Estimated Creatinine Clearance: 41.6 mL/min (by C-G formula based on SCr of 0.7 mg/dL).  Liver Function Tests: Recent Labs  Lab 09/01/21 1415 09/06/21 0237 09/07/21 0241  AST 22  --   --   ALT 10  --   --   ALKPHOS 85  --   --   BILITOT 1.2  --   --   PROT 6.1*  --   --   ALBUMIN 2.3* 2.1* 2.1*     CBG: No results for input(s): GLUCAP in the last 168 hours.   Recent Results (from the past 240 hour(s))  Blood culture (routine single)     Status: None   Collection Time: 09/01/21  2:15 PM   Specimen: BLOOD  Result Value Ref Range Status   Specimen Description   Final    BLOOD LEFT ANTECUBITAL Performed at [facility] [address], [place], [place] [address]    Special Requests   Final    BOTTLES DRAWN AEROBIC AND ANAEROBIC Blood Culture adequate volume Performed at [facility] [address] [place], [place] [address]    Culture   Final    NO GROWTH 5 DAYS Performed at [facility], [place] [address], [place], [place] [address]    Report Status 09/06/2021 FINAL  Final  Resp Panel by RT-PCR (Flu A&B, Covid)     Status: None   Collection Time: 09/01/21  2:20 PM   Specimen: Nasopharyngeal(NP) swabs in vial transport medium  Result Value Ref Range Status   SARS Coronavirus 2 by RT PCR NEGATIVE NEGATIVE Final    Comment: (NOTE) SARS-CoV-2 target nucleic acids are NOT DETECTED.  The SARS-CoV-2 RNA is generally detectable in upper respiratory specimens during the acute phase of infection. The lowest concentration of SARS-CoV-2 viral copies this assay can detect is 138 copies/mL. A negative result does not preclude SARS-Cov-2 infection and should not be used as the sole basis for treatment or other patient management decisions. A negative  result may occur with  improper specimen collection/handling, submission of specimen other than nasopharyngeal swab, presence of viral mutation(s) within the areas targeted by this assay, and inadequate number of viral copies(<138 copies/mL). A negative result must be combined with clinical observations, patient history, and epidemiological information. The expected result is Negative.  Fact Sheet for Patients:  EntrepreneurPulse.com.au  Fact Sheet for Healthcare Providers:  IncredibleEmployment.be  This test is no t yet approved or cleared by the [place] FDA and  has been authorized for detection and/or diagnosis of SARS-CoV-2 by FDA under an Emergency Use Authorization (EUA). This EUA will remain  in effect (meaning this test can be used) for the duration of the COVID-19 declaration under Section 564(b)(1) of the Act, 21 U.S.C.section 360bbb-3(b)(1), unless the authorization is terminated  or revoked sooner.       Influenza A by PCR NEGATIVE NEGATIVE Final   Influenza B by PCR NEGATIVE NEGATIVE Final    Comment: (NOTE) The Xpert Xpress SARS-CoV-2/FLU/RSV plus assay is intended as an aid in the diagnosis of influenza from Nasopharyngeal swab specimens and should not be used as a sole basis for treatment. Nasal washings and aspirates are unacceptable for Xpert Xpress SARS-CoV-2/FLU/RSV testing.  Fact Sheet for Patients: EntrepreneurPulse.com.au  Fact Sheet for Healthcare Providers: IncredibleEmployment.be  This test is not yet approved or cleared by the [place] FDA and has been authorized for detection and/or diagnosis of SARS-CoV-2 by FDA under an Emergency Use Authorization (EUA). This EUA will remain in effect (meaning this test can be used) for the duration of the COVID-19 declaration under Section 564(b)(1) of the Act, 21 U.S.C. section 360bbb-3(b)(1), unless the authorization is  terminated or revoked.  Performed at [facility], [place] [address], [place], [place] [address]   Urine Culture     Status: Abnormal   Collection Time: 09/01/21  8:00 PM   Specimen: In/Out Cath Urine  Result Value Ref Range Status   Specimen Description   Final    IN/OUT CATH URINE Performed at [facility] [address] [place], [place] [address]    Special Requests   Final    NONE Performed at [facility], [place] [address] [place], [place] [address]    Culture MULTIPLE SPECIES PRESENT, SUGGEST RECOLLECTION (A)  Final   Report Status 09/02/2021 FINAL  Final  MRSA Next Gen by PCR, Nasal     Status: None   Collection Time: 09/02/21 11:34 PM   Specimen: Nasal Mucosa; Nasal Swab  Result Value Ref Range Status   MRSA by PCR Next Gen NOT DETECTED NOT DETECTED Final    Comment: (NOTE) The GeneXpert MRSA Assay (FDA approved for NASAL specimens only), is one component of a comprehensive MRSA colonization surveillance program. It is not intended to diagnose MRSA infection nor to guide or monitor treatment for MRSA infections. Test performance is not FDA approved in patients less than 19 years old. Performed at [facility], [place] [address], [place], [place] [address]   Expectorated Sputum Assessment w Gram Stain, Rflx to Resp Cult     Status: None   Collection Time: 09/05/21 12:13 PM   Specimen: Sputum  Result Value Ref Range Status   Specimen Description SPU  Final   Special Requests NONE  Final   Sputum evaluation   Final    THIS SPECIMEN IS ACCEPTABLE FOR SPUTUM CULTURE Performed at [facility], [place] [address], [place], [place] [address]    Report Status 09/05/2021 FINAL  Final  Culture, Respiratory w Gram Stain     Status: None (Preliminary result)   Collection Time: 09/05/21 12:13 PM   Specimen: Sputum  Result Value Ref Range Status   Specimen Description   Final     SPU Performed at [facility]  [address] [place], [place] [address]    Special Requests   Final    NONE Reflexed from [id number] Performed at [facility], [place] [address], [place], [place] [address]    Gram Stain   Final    ABUNDANT WBC PRESENT,BOTH PMN AND MONONUCLEAR FEW SQUAMOUS EPITHELIAL CELLS PRESENT NO ORGANISMS SEEN    Culture   Final    RARE Normal respiratory flora-no Staph aureus or Pseudomonas seen Performed at [facility], [address], [place], [place] [address]    Report Status PENDING  Incomplete          Radiology Studies: No results found.      Scheduled Meds:  (feeding supplement) PROSource Plus  30 mL Oral BID BM   acetaminophen  500 mg Oral TID   amiodarone  200 mg Oral Daily   arformoterol  15 mcg Nebulization BID   bisacodyl  10 mg Rectal Daily   budesonide (PULMICORT) nebulizer solution  0.5 mg Nebulization BID   busPIRone  10 mg Oral Daily   Chlorhexidine Gluconate Cloth  6 each Topical Daily   clopidogrel  75 mg Oral Daily   feeding supplement  237 mL Oral BID BM   fluticasone  2 spray Each Nare Daily   guaiFENesin  1,200 mg Oral BID   loratadine  10 mg Oral Daily   mouth rinse  15 mL Mouth Rinse BID   midodrine  5 mg Oral TID WC   multivitamin with minerals  1 tablet Oral Daily   pantoprazole  40 mg Oral Q0600   polyethylene glycol  17 g Oral BID   Rivaroxaban  15 mg Oral Q breakfast   Continuous Infusions:  sodium chloride 10 mL/hr at 09/05/21 1459   piperacillin-tazobactam (ZOSYN)  IV 3.375 g (09/08/21 0804)     LOS: 6 days    Time spent: 40 minutes    Irine Seal, MD [facility]   To contact the attending provider between 7A-7P or the covering provider during after hours 7P-7A, please log into the web site [email or web address] and access using universal [facility] password for that web site. If you do not have the password, please call the hospital  operator.  09/08/2021, 10:04 AM

## 2021-09-08 NOTE — Consult Note (Signed)
Consultation Note Date: 09/08/2021   Patient Name: Kristen [name]  DOB: 1946-05-30  MRN: [id number]  Age / Sex: 75 y.o., female  PCP: [name], MD Referring Physician: Eugenie Filler, MD  Reason for Consultation:   HPI/Patient Profile: 75 y.o. female  with past medical history of COPD on home O2 3L, lower extremity cellulitis, chronic stasis ulcers, a fib, aspiration pneumonia admitted on 09/01/2021 with sepsis related to pnueumonia, SLP consulted no overt signs of aspiration, additional findings of volume overload related to acute CHF exacerbation (EF 50%) as well as pulmonary hypertension, has required bipap prn- currently off, was on IV pressors- weaned off. Having some continued asymptomatic hypotension- started on midodrine. Palliative consulted for [place].   Primary Decision Maker PATIENT  Discussion: Natacha is sitting in her chair- notes some discomfort with positioning.  She was seen by Palliative provider Florentina Jenny in July and at that time noted preferences for full scope care with limits set at DNR/DNI.  Since that admission she has been living at home with her sister Kristen [name]- she feels that she is maintaining a good quality of life at this point. She has been able to adequately care for herself.  Her current goals of care are to continue current treatments and discharge home.  If she discharges and her condition were to worsen she would want for readmission and continued full scope care.  If she is unable to make decisions for herself she would wish for her sister, Kristen [name] to be her surrogate Media planner.     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)  AF (paroxysmal atrial fibrillation) (HCC)  Diarrhea  Generalized anxiety disorder  Chronic respiratory failure with hypoxia (HCC)  Aspiration pneumonia ([place])  Sepsis ([place])  Protein-calorie malnutrition ([place])  Lower extremity ulceration ([place])  Hypotension   Review of Systems  Physical Exam  Vital Signs: BP 95/63   Pulse 79   Temp 98.3 F (36.8 C) (Axillary)   Resp (!) 21   Ht 5' 11" (1.803 m)   Wt 44 kg   SpO2 99%   BMI 13.53 kg/m  Pain Scale: 0-10 POSS *See Group Information*: 1-Acceptable,Awake and alert Pain Score: 4    SpO2: SpO2: 99 % O2 Device:SpO2: 99 % O2 Flow Rate: .O2 Flow Rate (L/min): 5 L/min  IO: Intake/output summary:  Intake/Output Summary (Last 24 hours) at 09/08/2021 1526 Last data filed at 09/08/2021 1417 Gross per 24 hour  Intake 390.13 ml  Output 1650 ml  Net -1259.87 ml    LBM: Last BM Date: 09/06/21 Baseline  Weight: Weight: 41.9 kg Most recent weight: Weight: 44 kg     Palliative Assessment/Data:     Thank you for this consult. Palliative medicine will continue to follow and assist as needed.   Time In: 1411 Time Out: 1537 Time Total: 82 mins Greater than 50%  of this time was spent counseling and coordinating care related to the above assessment and plan.  Signed by: Mariana Kaufman, AGNP-C Palliative Medicine    Please contact Palliative Medicine Team phone at [phone] for questions and concerns.  For individual provider: See Shea Evans

## 2021-09-08 NOTE — Progress Notes (Addendum)
NAME:  Kristen [name], MRN:  [id number], DOB:  Jan 17, 1946, LOS: 6 ADMISSION DATE:  09/01/2021, CONSULTATION DATE:  09/04/2021 REFERRING MD:  Dr. Grandville Silos, CHIEF COMPLAINT:  Resp failure   History of Present Illness:  75 year old female with past medical history significant for former smoker, COPD, chronic hypoxic respiratory failure on home O2 3L [place], HFpEF, Afib on Xarelto, venous/ arterial insufficiency, and protein calorie malnutrition admitted to [facility] on 10/10 with sepsis, hypoxic respiratory failure, and pneumonia.   She presented to ER due to progressive generalized weakness, right sided chest pain, chills, productive cough with greenish-brown sputum, and diarrhea x 5 days.   She reports she was trying to avoid coming to the hospital and waited but now regrets waiting.  Had increased her home O2 to 3.5 L mostly due to feeling anxious.  She denies any fever, hemoptysis, dysphagia, or choking episodes.  She is divorced, lives with her sister, and has two children who live in [place].   Found to be afebrile with with soft blood pressures, improved after albumin, and labs showing leukocytosis, Na 131, BNP 942, Flu/ SARs neg, negative urine legionella and strep, and CXR with new extensive consolidation in the lateral right midlung and right base c/w with pneumonia, chronic hyperexpansion, and small right pleural effusion.  She since has had an increasing O2 requirement, placed on high flow [place] but now with trial of BiPAP.  Started on low dose lasix this morning for volume overload.  She was treated in July 2022 for an aspiration pneumonia, therefore started on CAP coverage, changed to unasyn but coverage expanded to zosyn today given CXR progression.  No further diarrhea since admission.  This is her fifth hospitalization this year for respiratory failure related to COPD and/ or aspiration pneumonia.  She is a DNI.  PCCM consulted for further recommendations.  Pertinent  Medical History  Former smoker, COPD,  chronic hypoxic respiratory failure on home O2 3L [place], Afib on Xarelto, venous/ arterial insufficiency, protein calorie malnutrition, HFpEF, hyponatremia, COVID 05/2021  Significant Hospital Events: Including procedures, antibiotic start and stop dates in addition to other pertinent events   10/10 admitted with sepsis, pna 10/13 PCCM consulted  10/14 required intermittent levophed peripherally 10/16 midodrine started for intermittently low Bps, speech therapy consult without overt aspiration risk  Interim History / Subjective:   No acute events overnight. Patient continues to have good appetite. She was turned up to 10L of O2 at night as she is a mouth breather and has desaturations. She does not tolerate CPAP mask for long periods of time.  Objective   Blood pressure (!) 86/49, pulse 72, temperature 98 F (36.7 C), temperature source Oral, resp. rate 16, height _0  (1.803 m), weight 44 kg, SpO2 100 %.        Intake/Output Summary (Last 24 hours) at 09/08/2021 0736 Last data filed at 09/07/2021 1800 Gross per 24 hour  Intake 136.6 ml  Output 1200 ml  Net -1063.4 ml   Filed Weights   09/02/21 1000 09/02/21 1900 09/04/21 1000  Weight: 41.9 kg 41.9 kg 44 kg   Examination: General:  frail elderly woman, no acute distress HEENT: moist mucous membranes, sclera anicteric Neuro: Alert, oriented, moving all extremities CV: rrr, no murmurs PULM:  course breath sounds on right, no wheezing. HFNC in place GI: soft, bs+, NT, ND Extremities: warm/dry, no edema Skin: no rashes   Resolved Hospital Problem list    Assessment & Plan:  Septic Shock - resolved Intermittent  Hypotension - weaned off levophed 10/15, intermittently low Bps, will start midodrine 36m TID - MRSA screen negative. Urine legionella and strep negative - Fluid resuscitation PRN - random cortisol 16, less concern for adrenal insufficiency - low dose midodrine started 10/16 for intermittent hypotension  Acute on  Chronic Hypoxemic Respiratory Failure ?Pulmonary Hypertension Pneumonia COPD, 3L O2 baseline - Continue nebulizer treatments - Bronchial hygiene with nebs, incentive spirometer and flutter valve - Continue zosyn for 7 day course - PRN Bipap - Continue HFNC, wean for goal SpO2 90-92% - Speech consult less concerning for aspiration at this time. May need barium swallow evaluation in future. - Will need follow up imaging in 6-8 weeks for monitoring of resolution of the dense right upper lobe pneumonia - Echo concerning for pulmonary hypertension, likely in setting of group III disease with chronic respiratory failure. She should have sleep study performed in outpatient setting.   PCCM will continue to follow  Remaining per primary service Diarrhea- resolved PAF- on xarelto, in NSR Prolonged Qtc Chronic hyponatremia Moderate protein calorie malnutrition   Best Practice (right click and "Reselect all SmartList Selections" daily)   Diet/type: Regular consistency (see orders) DVT prophylaxis: DOAC GI prophylaxis: PPI Lines: N/A Foley:  N/A Code Status:  limited Last date of multidisciplinary goals of care discussion [per primary]  Labs   CBC: Recent Labs  Lab 09/01/21 1415 09/02/21 0500 09/04/21 0234 09/05/21 0237 09/06/21 0237 09/07/21 0241 09/08/21 0254  WBC 17.5*   < > 20.0* 19.0* 17.3* 17.2* 18.6*  NEUTROABS 14.9*  --   --  15.3* 13.3* 13.5* 14.7*  HGB 11.1*   < > 8.8* 8.8* 8.3* 8.9* 8.3*  HCT 35.4*   < > 27.3* 27.9* 25.7* 27.5* 26.0*  MCV 91.2   < > 89.8 91.8 87.7 88.7 88.1  PLT 501*   < > 507* 517* 554* 552* 572*   < > = values in this interval not displayed.    Basic Metabolic Panel: Recent Labs  Lab 09/04/21 0234 09/05/21 0237 09/06/21 0237 09/07/21 0241 09/08/21 0254  NA 135 130* 132* 128* 128*  K 3.1* 4.4 4.4 4.0 4.6  CL 101 97* 97* 92* 91*  CO2 _0 GLUCOSE 119* 110* 97 124* 81  BUN _1 CREATININE 0.56 0.70 0.61 0.61 0.70   CALCIUM 8.6* 8.1* 8.1* 8.1* 7.9*  MG 1.7 1.8 1.7 1.7 1.9  PHOS  --   --  2.5 2.7  --    GFR: Estimated Creatinine Clearance: 41.6 mL/min (by C-G formula based on SCr of 0.7 mg/dL). Recent Labs  Lab 09/01/21 1402 09/01/21 1415 09/01/21 1601 09/02/21 0500 09/05/21 0237 09/06/21 0237 09/07/21 0241 09/08/21 0254  WBC  --    < >  --    < > 19.0* 17.3* 17.2* 18.6*  LATICACIDVEN 1.9  --  0.9  --   --   --   --   --    < > = values in this interval not displayed.    Liver Function Tests: Recent Labs  Lab 09/01/21 1415 09/06/21 0237 09/07/21 0241  AST 22  --   --   ALT 10  --   --   ALKPHOS 85  --   --   BILITOT 1.2  --   --   PROT 6.1*  --   --   ALBUMIN 2.3* 2.1* 2.1*   No results for input(s): LIPASE, AMYLASE in the last 168 hours. No  results for input(s): AMMONIA in the last 168 hours.  ABG    Component Value Date/Time   PHART 7.394 09/04/2021 0811   PCO2ART 44.3 09/04/2021 0811   PO2ART 58.5 (L) 09/04/2021 0811   HCO3 26.4 09/04/2021 0811   TCO2 42 (H) 02/22/2021 1655   ACIDBASEDEF 2.0 06/28/2020 1750   O2SAT 89.6 09/04/2021 0811     Coagulation Profile: Recent Labs  Lab 09/01/21 1415  INR 1.9*    Cardiac Enzymes: No results for input(s): CKTOTAL, CKMB, CKMBINDEX, TROPONINI in the last 168 hours.  HbA1C: Hgb A1c MFr Bld  Date/Time Value Ref Range Status  06/29/2020 04:27 PM 5.6 4.8 - 5.6 % Final    Comment:    (NOTE) Pre diabetes:          5.7%-6.4%  Diabetes:              >6.4%  Glycemic control for   <7.0% adults with diabetes     CBG: No results for input(s): GLUCAP in the last 168 hours.  Critical care time: n/a    Freda Jackson, MD [facility] Office: [phone]   See Amion for personal pager PCCM on call pager [phone] until 7pm. Please call Elink 7p-7a. [phone]

## 2021-09-09 DIAGNOSIS — J9601 Acute respiratory failure with hypoxia: Secondary | ICD-10-CM | POA: Diagnosis not present

## 2021-09-09 DIAGNOSIS — A419 Sepsis, unspecified organism: Secondary | ICD-10-CM | POA: Diagnosis not present

## 2021-09-09 DIAGNOSIS — J449 Chronic obstructive pulmonary disease, unspecified: Secondary | ICD-10-CM | POA: Diagnosis not present

## 2021-09-09 DIAGNOSIS — I480 Paroxysmal atrial fibrillation: Secondary | ICD-10-CM | POA: Diagnosis not present

## 2021-09-09 DIAGNOSIS — J690 Pneumonitis due to inhalation of food and vomit: Secondary | ICD-10-CM | POA: Diagnosis not present

## 2021-09-09 DIAGNOSIS — J189 Pneumonia, unspecified organism: Secondary | ICD-10-CM | POA: Diagnosis not present

## 2021-09-09 LAB — CBC
HCT: 26.9 % — ABNORMAL LOW (ref 36.0–46.0)
Hemoglobin: 8.6 g/dL — ABNORMAL LOW (ref 12.0–15.0)
MCH: 28.7 pg (ref 26.0–34.0)
MCHC: 32 g/dL (ref 30.0–36.0)
MCV: 89.7 fL (ref 80.0–100.0)
Platelets: 603 10*3/uL — ABNORMAL HIGH (ref 150–400)
RBC: 3 MIL/uL — ABNORMAL LOW (ref 3.87–5.11)
RDW: 14.6 % (ref 11.5–15.5)
WBC: 16.5 10*3/uL — ABNORMAL HIGH (ref 4.0–10.5)
nRBC: 0 % (ref 0.0–0.2)

## 2021-09-09 LAB — BASIC METABOLIC PANEL
Anion gap: 8 (ref 5–15)
BUN: 16 mg/dL (ref 8–23)
CO2: 30 mmol/L (ref 22–32)
Calcium: 8.1 mg/dL — ABNORMAL LOW (ref 8.9–10.3)
Chloride: 92 mmol/L — ABNORMAL LOW (ref 98–111)
Creatinine, Ser: 0.59 mg/dL (ref 0.44–1.00)
GFR, Estimated: >60 mL/min (ref >60)
Glucose, Bld: 82 mg/dL (ref 70–99)
Potassium: 4.7 mmol/L (ref 3.5–5.1)
Sodium: 130 mmol/L — ABNORMAL LOW (ref 135–145)

## 2021-09-09 NOTE — Progress Notes (Signed)
OT Cancellation Note  Patient Details Name: Kristen [name] MRN: [id number] DOB: 09/04/1946   Cancelled Treatment:    Reason Eval/Treat Not Completed: Patient declined, no reason specified nursing asked to hold off on treatment this AM secondary to low BP. Patient was approached this afternoon with patient declining to participate. Patient reported she would like early session tomorrow AM. Will continue to follow and check back as schedule allows.   Jackelyn Poling OTR/L, [facility] Office# [phone] Pager# [phone]   09/09/2021, 3:02 PM

## 2021-09-09 NOTE — Progress Notes (Signed)
Daily Progress Note   Patient Name: Kristen [name]       Date: 09/09/2021 DOB: June 27, 1946  Age: 75 y.o. MRN#: [id number] Attending Physician: Eugenie Filler, MD Primary Care Physician: [name], MD Admit Date: 09/01/2021  Reason for Consultation/Follow-up: Establishing goals of care  Patient Profile/HPI:  75 y.o. female  with past medical history of COPD on home O2 3L, lower extremity cellulitis, chronic stasis ulcers, a fib, aspiration pneumonia admitted on 09/01/2021 with sepsis related to pnueumonia, SLP consulted no overt signs of aspiration, additional findings of volume overload related to acute CHF exacerbation (EF 50%) as well as pulmonary hypertension, has required bipap prn- currently off, was on IV pressors- weaned off. Having some continued asymptomatic hypotension- started on midodrine. Palliative consulted for [place].   Subjective: [name] is sitting in bed, awake, alert, eating lunch.  She discussed with me her knowledge of the challenges to getting her home right now- mainly her increasing oxygen requirements and her low blood pressure.  She continues to desire to work to treat these with the goal of going home. She does not want to go to a rehab facility and states she will spend every last dime she has to bring in someone to help care for her if needed.   ROS   Physical Exam Vitals and nursing note reviewed.  Cardiovascular:     Rate and Rhythm: Normal rate and regular rhythm.  Pulmonary:     Effort: Pulmonary effort is normal.  Neurological:     Mental Status: She is alert and oriented to person, place, and time.            Vital Signs: BP (!) 103/50 (BP Location: Left Arm)   Pulse 74   Temp 97.7 F (36.5 C) (Oral)   Resp (!) 25   Ht _0  (1.803 m)   Wt  44 kg   SpO2 91%   BMI 13.53 kg/m  SpO2: SpO2: 91 % O2 Device: O2 Device: High Flow Nasal Cannula O2 Flow Rate: O2 Flow Rate (L/min): 15 L/min (increased due to low saturations)  Intake/output summary:  Intake/Output Summary (Last 24 hours) at 09/09/2021 1358 Last data filed at 09/09/2021 0905 Gross per 24 hour  Intake 1122.16 ml  Output 1000 ml  Net 122.16 ml   LBM: Last BM Date: 09/06/21  Baseline Weight: Weight: 41.9 kg Most recent weight: Weight: 44 kg       Palliative Assessment/Data: PPS: 50%      Patient Active Problem List   Diagnosis Date Noted  . Elevated troponin   . Sepsis ([place]) 09/03/2021  . Protein-calorie malnutrition, severe 09/03/2021  . Community acquired pneumonia 09/01/2021  . Diarrhea 09/01/2021  . Chronic respiratory failure with hypoxia ([place]) 09/01/2021  . Hypotension   . Pressure injury of skin 06/08/2021  . CAP (community acquired pneumonia) 05/29/2021  . Aspiration pneumonia ([place]) 05/29/2021  . Atrial fibrillation with RVR ([place]) 05/27/2021  . Cellulitis of left lower extremity 05/06/2021  . Aortic atherosclerosis ([place]) 03/07/2021  . Opiate use 03/05/2021  . Need for assessment by dentistry for poor dentition 03/05/2021  . Abdominal pain 02/06/2021  . Health care maintenance 12/27/2020  . AF (paroxysmal atrial fibrillation) ([place]) 12/16/2020  . Nausea 11/07/2020  . Lower extremity ulceration ([place]) 10/04/2020  . Generalized anxiety disorder 08/27/2020  . 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 Smoking 07/18/2020  . Venous insufficiency of both lower extremities 07/18/2020  . Acute respiratory failure with hypoxia ([place]) 07/01/2020  . COPD (chronic obstructive pulmonary disease) ([place]) 06/28/2020  . Protein-calorie malnutrition ([place]) 06/28/2020  . Hyponatremia 06/28/2020    Palliative Care Assessment & Plan    Assessment/Recommendations/Plan  Continue current plan Will continue to follow and discuss goals of care prn   Code  Status: Limited code - no CPR, no intubation   Prognosis:  Unable to determine  Discharge Planning: Home with Palliative Services and [place] was discussed with patient.   Thank you for allowing the Palliative Medicine Team to assist in the care of this patient.  Total time:  39 minutes Prolonged billing: No     Greater than 50%  of this time was spent counseling and coordinating care related to the above assessment and plan.  Mariana Kaufman, AGNP-C Palliative Medicine   Please contact Palliative Medicine Team phone at [phone] for questions and concerns.

## 2021-09-09 NOTE — Progress Notes (Signed)
NAME:  Kristen [name], MRN:  [id number], DOB:  [birth year], LOS: 7 ADMISSION DATE:  09/01/2021, CONSULTATION DATE:  09/04/2021 REFERRING MD:  Dr. Grandville Silos, CHIEF COMPLAINT:  Resp failure   History of Present Illness:  75 year old female with past medical history significant for former smoker, COPD, chronic hypoxic respiratory failure on home O2 3L [place], HFpEF, Afib on Xarelto, venous/ arterial insufficiency, and protein calorie malnutrition admitted to [facility] on 10/10 with sepsis, hypoxic respiratory failure, and pneumonia.   She presented to ER due to progressive generalized weakness, right sided chest pain, chills, productive cough with greenish-brown sputum, and diarrhea x 5 days.   She reports she was trying to avoid coming to the hospital and waited but now regrets waiting.  Had increased her home O2 to 3.5 L mostly due to feeling anxious.  She denies any fever, hemoptysis, dysphagia, or choking episodes.  She is divorced, lives with her sister, and has two children who live in [place].   Found to be afebrile with with soft blood pressures, improved after albumin, and labs showing leukocytosis, Na 131, BNP 942, Flu/ SARs neg, negative urine legionella and strep, and CXR with new extensive consolidation in the lateral right midlung and right base c/w with pneumonia, chronic hyperexpansion, and small right pleural effusion.  She since has had an increasing O2 requirement, placed on high flow [place] but now with trial of BiPAP.  Started on low dose lasix this morning for volume overload.  She was treated in July 2022 for an aspiration pneumonia, therefore started on CAP coverage, changed to unasyn but coverage expanded to zosyn today given CXR progression.  No further diarrhea since admission.  This is her fifth hospitalization this year for respiratory failure related to COPD and/ or aspiration pneumonia.  She is a DNI.  PCCM consulted for further recommendations.  Pertinent  Medical History  Former smoker, COPD,  chronic hypoxic respiratory failure on home O2 3L [place], Afib on Xarelto, venous/ arterial insufficiency, protein calorie malnutrition, HFpEF, hyponatremia, COVID 05/2021  Significant Hospital Events: Including procedures, antibiotic start and stop dates in addition to other pertinent events   10/10 admitted with sepsis, pna 10/13 PCCM consulted  10/14 required intermittent levophed peripherally 10/16 midodrine started for intermittently low Bps, speech therapy consult without overt aspiration risk Respiratory culture 10/14 >> normal flora.  Blood culture 10/10 negative  Interim History / Subjective:   I/O +2.3 L  WBC 18.6 > 16.5 Did not wear CPAP / BiPAP overnight Palliative care consultation performed on 10/17; patient wants full scope of care but is DNR/I   Objective   Blood pressure (!) 70/36, pulse 70, temperature 98.3 F (36.8 C), temperature source Oral, resp. rate 15, height _0  (1.803 m), weight 44 kg, SpO2 93 %.        Intake/Output Summary (Last 24 hours) at 09/09/2021 0742 Last data filed at 09/08/2021 1816 Gross per 24 hour  Intake 1601.44 ml  Output 650 ml  Net 951.44 ml   Filed Weights   09/02/21 1000 09/02/21 1900 09/04/21 1000  Weight: 41.9 kg 41.9 kg 44 kg   Examination: General: Very thin, laying in bed comfortably with O2 in place HEENT: Oropharynx moist, no oral lesions, no stridor Neuro: Awake, alert, well-oriented, interacting appropriately, follows commands, good strength CV: Regular, distant, no murmur PULM: Coarse breath sounds on the right, clear on the left, no wheezing.  Comfortable respiratory pattern.  Able to converse without shortness of breath GI: Nondistended, positive bowel sounds  Extremities: No edema Skin: No rash  Resolved Hospital Problem list    Assessment & Plan:  Septic Shock - resolved Intermittent Hypotension -Tolerating midodrine, would continue.  Could uptitrate depending on blood pressure trend -Fluid resuscitation if  transient hypotension  Acute on Chronic Hypoxemic Respiratory Failure ?Pulmonary Hypertension Pneumonia COPD, 3L O2 baseline -Can continue to keep BiPAP available as needed, has not been required last 24 hours -Complete 7 days Zosyn for right upper lobe pneumonia -Push pulmonary hygiene -Mucinex as ordered, Pulmicort nebs, Brovana nebs -Xopenex ordered as needed -We will need repeat chest x-ray in about 6 weeks to look for improvement in her right upper lobe infiltrate -The amount of oxygen she is requiring is concerned.  Her goal is to be able to go home but remains on high flow nasal cannula.  May take more time for her pneumonia to fully clear, recovery to plateau.  She wants to avoid going to SNF for rehab but her oxygen needs may dictate whether home is safe. -Might benefit from sleep study as an outpatient if she was willing to participate with CPAP   Remaining per primary service Diarrhea- resolved PAF- on xarelto, in NSR Prolonged Qtc Chronic hyponatremia Moderate protein calorie malnutrition   Best Practice (right click and "Reselect all SmartList Selections" daily)   Diet/type: Regular consistency (see orders) DVT prophylaxis: DOAC GI prophylaxis: PPI Lines: N/A Foley:  N/A Code Status:  limited Last date of multidisciplinary goals of care discussion [per primary]  Labs   CBC: Recent Labs  Lab 09/05/21 0237 09/06/21 0237 09/07/21 0241 09/08/21 0254 09/09/21 0239  WBC 19.0* 17.3* 17.2* 18.6* 16.5*  NEUTROABS 15.3* 13.3* 13.5* 14.7*  --   HGB 8.8* 8.3* 8.9* 8.3* 8.6*  HCT 27.9* 25.7* 27.5* 26.0* 26.9*  MCV 91.8 87.7 88.7 88.1 89.7  PLT 517* 554* 552* 572* 603*    Basic Metabolic Panel: Recent Labs  Lab 09/04/21 0234 09/05/21 0237 09/06/21 0237 09/07/21 0241 09/08/21 0254 09/09/21 0239  NA 135 130* 132* 128* 128* 130*  K 3.1* 4.4 4.4 4.0 4.6 4.7  CL 101 97* 97* 92* 91* 92*  CO2 _0 GLUCOSE 119* 110* 97 124* 81 82  BUN _1 CREATININE 0.56 0.70 0.61 0.61 0.70 0.59  CALCIUM 8.6* 8.1* 8.1* 8.1* 7.9* 8.1*  MG 1.7 1.8 1.7 1.7 1.9  --   PHOS  --   --  2.5 2.7  --   --    GFR: Estimated Creatinine Clearance: 41.6 mL/min (by C-G formula based on SCr of 0.59 mg/dL). Recent Labs  Lab 09/06/21 0237 09/07/21 0241 09/08/21 0254 09/09/21 0239  WBC 17.3* 17.2* 18.6* 16.5*    Liver Function Tests: Recent Labs  Lab 09/06/21 0237 09/07/21 0241  ALBUMIN 2.1* 2.1*   No results for input(s): LIPASE, AMYLASE in the last 168 hours. No results for input(s): AMMONIA in the last 168 hours.  ABG    Component Value Date/Time   PHART 7.394 09/04/2021 0811   PCO2ART 44.3 09/04/2021 0811   PO2ART 58.5 (L) 09/04/2021 0811   HCO3 26.4 09/04/2021 0811   TCO2 42 (H) 02/22/2021 1655   ACIDBASEDEF 2.0 06/28/2020 1750   O2SAT 89.6 09/04/2021 0811     Coagulation Profile: No results for input(s): INR, PROTIME in the last 168 hours.   Cardiac Enzymes: No results for input(s): CKTOTAL, CKMB, CKMBINDEX, TROPONINI in the last 168 hours.  HbA1C: Hgb A1c MFr Bld  Date/Time Value Ref Range Status  06/29/2020 04:27 PM 5.6 4.8 - 5.6 % Final    Comment:    (NOTE) Pre diabetes:          5.7%-6.4%  Diabetes:              >6.4%  Glycemic control for   <7.0% adults with diabetes     CBG: No results for input(s): GLUCAP in the last 168 hours.  Critical care time: n/a     Baltazar Apo, MD, PhD 09/09/2021, 7:52 AM [facility] [phone] or if no answer before 7:00PM call [phone] For any issues after 7:00PM please call eLink [phone]

## 2021-09-09 NOTE — Progress Notes (Signed)
Patient's FIO2 was increased due to a low saturation. The patient was on 5LHF when givena nebulizer treatment. Post neb, she was going to sleep and her saturations continued to drop as low as 83%. Oxygen probe was moved around on her forehead to maintain a good wave form and reading. She was then increased to 15LHF and is tolerating well at this time with no distress while dozing off. Rt will continue to monitor.

## 2021-09-09 NOTE — TOC Progression Note (Signed)
Transition of Care [facility]) - Progression Note    Patient Details  Name: Kristen [name] MRN: [id number] Date of Birth: 1946/07/23  Transition of Care [facility]) CM/SW Contact  Leeroy Cha, RN Phone Number: 09/09/2021, 9:03 AM  Clinical Narrative:     75 y.o. female  with past medical history of COPD on home O2 3L, lower extremity cellulitis, chronic stasis ulcers, a fib, aspiration pneumonia admitted on 09/01/2021 with sepsis related to pnueumonia, SLP consulted no overt signs of aspiration, additional findings of volume overload related to acute CHF exacerbation (EF 50%) as well as pulmonary hypertension, has required bipap prn- currently off, was on IV pressors- weaned off. Having some continued asymptomatic hypotension- started on midodrine. Palliative consulted for [place].    Primary Decision Maker PATIENT   Discussion: Kristen [name] is sitting in her chair- notes some discomfort with positioning.  She was seen by Palliative provider Florentina Jenny in July and at that time noted preferences for full scope care with limits set at DNR/DNI.  Since that admission she has been living at home with her sister Kristen [name]- she feels that she is maintaining a good quality of life at this point. She has been able to adequately care for herself.  Her current goals of care are to continue current treatments and discharge home.  If she discharges and her condition were to worsen she would want for readmission and continued full scope care.  If she is unable to make decisions for herself she would wish for her sister, Kristen [name] to be her surrogate Media planner.      SUMMARY OF RECOMMENDATIONS -Continue current plan -Will refer for out patient Palliative to follow at home at discharge -PMT will continue to shadow and revisit goals if patient shows evidence of decline    Code Status/Advance Care Planning: DNR  TOC PLAN OF CARE: following for toc needs possible home to sisters house with hospice will continue  to follow Expected Discharge Plan: Home/Self Care Barriers to Discharge: Continued Medical Work up  Expected Discharge Plan and Services Expected Discharge Plan: Home/Self Care   Discharge Planning Services: CM Consult   Living arrangements for the past 2 months: Single Family Home                                       Social Determinants of Health (SDOH) Interventions    Readmission Risk Interventions Readmission Risk Prevention Plan 06/17/2021 06/02/2021 09/29/2020  Transportation Screening Complete Complete Complete  PCP or Specialist Appt within 5-7 Days - - Complete  Home Care Screening - - Complete  Medication Review (RN CM) - - Complete  Medication Review Press photographer) - Complete -  PCP or Specialist appointment within 3-5 days of discharge - Complete -  HRI or Home Care Consult - Complete -  SW Recovery Care/Counseling Consult Complete Complete -  Palliative Care Screening Complete Complete -  Skilled Nursing Facility Complete Complete -  Some recent data might be hidden

## 2021-09-09 NOTE — Progress Notes (Signed)
PROGRESS NOTE    Kristen [name]  DTO:[id number] DOB: 26-Aug-1946 DOA: 09/01/2021 PCP: [name], MD   No chief complaint on file.   Brief Narrative:  Kristen [name] is a 75 y.o. female with medical history significant of COPD with chronic hypoxic respiratory failure on 3 L/min oxygen at baseline, chronic A-fib, venous insufficiency, hx of transient hypotension, hospital admission in July of this year for aspiration pneumonia and acute on chronic hypoxic respiratory failure.  She was brought to [facility] ED from home by EMS due to ongoing diarrhea x 5 days, productive cough with associated right-sided chest pain and chills, with progressive generalized weakness.  Denies abdominal pain, nausea, vomiting, dysuria, focal weakness numbness or tingling, headaches or other recent symptoms.  Reports increasing her oxygen briefly to 3.5 L/min mostly due to feeling anxious.  Otherwise has been on her usual 3 L/min. Chest xray showed new extensive consolidation in the lateral right middle and lower lung, consistent with PNA, small right pleural effusion and chronic hyperexpansion compatible with emphysema. Patient is followed by the IM residency service at [facility], but this states she would prefer to be admitted here to [facility] this time. Patient admitted placed empirically on IV antibiotics, respiratory status worsen and IV antibiotics broadened, PCCM consulted and following.  Patient also noted to have elevated troponins with abnormal 2D echo, cardiology consulted and following.   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 -On admission patient noted to have met criteria for sepsis with leukocytosis, tachypnea, pneumonia. -Patient with a rising leukocytosis however started to slowly trend back down with broadening of IV antibiotic coverage..   -White count today at 16.5 from 17.2 from 17.3 from 19.0 from 20.  -Patient initially had some improvement with O2 requirements going as low as 5 L high flow nasal cannula however this morning patient with sats of 91% on 15 L high flow nasal cannula.  -Blood cultures pending with no growth to date x5 days, MRSA PCR negative.  Urine cultures with multiple species. -Sputum gram stain and culture with no organisms seen. -Improving clinically respiratory wise -IV antibiotics broadened from Unasyn to IV Zosyn to complete a 7-day course of treatment with Zosyn per PCCM recommendations. -Continue Brovana, Pulmicort, scheduled Xopenex and Atrovent nebs, Mucinex, Claritin, Flonase. -We will need repeat imaging in 6 to 8 weeks for resolution of dense right upper lobe pneumonia per PCCM recommendations. -Supportive care.  2.  Hypotension -Patient with systolic blood pressures noted to be low in the 80s on 09/03/2021.   -Random cortisol 16.8 on 09/05/2021. -IV albumin every 6 hours x24 hours given with some improvement with blood pressure however blood pressure was soft on 09/05/2021 and patient became hypotensive requiring Levophed pressor which has been weaned off as of 09/06/2021.   -Patient started on midodrine per  PCCM. -Systolic blood pressures in the low 100s. -Diuretics discontinued.   -Supportive care.   -PCCM following.    3.  Acute on  chronic hypoxic respiratory failure in the setting of COPD likely secondary to aspiration pneumonia and probable volume overload/acute diastolic CHF exacerbation/abnormal 2D echo -Likely secondary to pneumonia/aspiration pneumonia in the setting of possible volume overload. -Patient with use of accessory muscles of respiration the morning of 09/04/2021,  with complaints of shortness of breath and currently on 15 L high flow nasal cannula with sats of 97% on 09/05/2021. -Currently back on 15 L high flow nasal cannula with sats of approximately 91% . -Patient clinically looks improved with no use of accessory muscles of respiration.  -Improved clinically since 09/04/2021 after trial of BiPAP, broadening of antibiotics. -BNP obtained at 942.6. -2D echo from 10/23/2020 with a EF of 55 to 49%,SWHQ, grade 1 diastolic dysfunction, interventricular septum flattened in diastole consistent with right ventricular volume overload, low normal right ventricular systolic function, right ventricular size normal. -2D echo from 09/05/2021 with EF of 50%, left ventricle with wall motion abnormalities, left ventricular diastolic parameters indeterminate, interventricular septum is flattened in systole consistent with right ventricular pressure overload, mildly reduced right ventricular systolic function, right ventricular size is moderately to severely enlarged, moderately elevated pulmonary artery systolic pressure, right atrial size moderately dilated. -Patient on home O2 with a baseline of 3 L nasal cannula. -Blood pressure improved post IV albumin however patient remained hypotensive on 09/05/2021 requiring placement on pressors/Levophed.  -Levophed pressors weaned off 09/06/2021 and patient placed on midodrine. -Repeat chest x-ray from 09/04/2021 with increased confluent opacity over the right upper lobe with worsening variation throughout remainder of right lung concerning for worsening pneumonia with a persistent  parapneumonic effusion.   -Repeat chest x-ray from 09/05/2021 with multilobular infiltrate and small effusions, most likely pneumonia, mild treatment right upper lobe infiltrate.  -ABG with a pH of 7.394, PCO2 of 44, PO2 of 58.5. -Status post Lasix 20 mg IV x1 on 09/04/2021 with urine output of 800 cc recorded.   -Diuretics discontinued. -Strict I's and O's.  -BiPAP as needed.  -Continue scheduled Xopenex and Atrovent nebs, Pulmicort, Brovana, Claritin, PPI, Flonase, IV Zosyn (to complete a 7-day course of treatment). -Cardiology following. -PCCM following and appreciate their input and recommendations.  -May benefit from sleep study in the outpatient setting on discharge.  4.  Elevated troponin/abnormal 2D echo -Patient with acute on chronic respiratory distress on 09/04/2021, noted to have elevated BNP with concerns for volume overload.  Cardiac enzymes were elevated. -2D echo with EF of 50%, left ventricle with wall motion abnormalities, left ventricular diastolic parameters indeterminate, interventricular septum is flattened in systole consistent with right ventricular pressure overload, mildly reduced right ventricular systolic function, right ventricular size is moderately to severely enlarged, moderately elevated pulmonary artery systolic pressure, right atrial size moderately dilated. -Patient seen in consultation by cardiology this point in time suspect no need for further evaluation given her comorbid illness and frailty. -Patient noted to be hypotensive was on pressors which have been weaned off.  Hold off on starting beta-blocker.  -Cardiology recommending continuation of empiric therapy with Plavix with no plans for invasive evaluation at this time. -Cardiology has signed off and recommending outpatient follow-up with Dr. Johney Frame in the outpatient setting. -Currently on midodrine.  5.  Diarrhea>>>.  Constipation -Patient had reported 5 days of diarrhea at home prior to  admission. -No bowel movement since admission. -Patient with complaints of constipation. -Received a  dose of sorbitol on 09/05/2021 with no results.   -Patient placed on daily Dulcolax suppositories, MiraLAX twice daily with bowel movement noted 09/06/2021, 09/07/2021, 09/08/2021.   -Continue current bowel regimen.    6.  Paroxysmal atrial fibrillation -Amiodarone for rate control.   -Xarelto for anticoagulation.   -Cardiology following.  7.  Prolonged QTC -Patient noted to have a QTC of 560 on admission, chronically on amiodarone. -Repeat EKG with resolution of QT prolongation currently at 437. -Keep potassium approximately at 4, magnesium approximately at 2.  8.  Chronic hyponatremia -At baseline sodium at 130 today.  9.  Chronic venous stasis of bilateral lower extremities/venous insufficiency with ulceration -Continue current wound treatment recommendations per [place] RN  10.  Hypomagnesemia/hypokalemia -Magnesium 1.9, potassium of 4.7.   -Follow.   11.  Moderate protein calorie malnutrition -Patient cachectic and frail on examination. -Albumin level at 2.3 on admission. -Diet liberalized.  -Status post IV albumin every 6 hours x1 day.   -Albumin currently at 2.1 (09/07/2021).   -Continue Ensure nutritional supplementation.   -RD consulted.  12.??  Pulmonary hypertension -2D echo concerning for pulmonary hypertension felt likely in the setting of group 3 disease with chronic respiratory failure/COPD per PCCM.  Patient would benefit from a sleep study in the outpatient setting.  13.  Goals of care -It is noted that palliative care was to follow-up with patient after recent discharge, patient is a DO NOT INTUBATE.  Patient does express desire for no CPR as she feels could break her ribs.  Patient is accepting of ACLS medications if needed.  CODE STATUS has been changed to limited code.   -Palliative care has assessed patient and current recommendations are to continue current  plan, outpatient palliative referral to follow-up at home on discharge.              DVT prophylaxis: Xarelto Code Status: Partial code Family Communication: Updated patient.  No family at bedside. Disposition:   Status is: Inpatient  Remains inpatient appropriate because:Inpatient level of care appropriate due to severity of illness  Dispo: The patient is from: Home              Anticipated d/c is to:  TBD              Patient currently is not medically stable to d/c.   Difficult to place patient No       Consultants:  WOC Phineas Douglas, RN 09/02/2021 PCCM: Dr.Olalere 09/04/2021 Cardiology: Dr. Percival Spanish 09/04/2021 Palliative care: Mariana Kaufman, NP 09/08/2021  Procedures:  Chest x-ray 09/01/2021, 09/04/2021 2D echo 09/05/2021   Antimicrobials: IV Unasyn 09/01/2021>>>>> 09/04/2021 IV azithromycin 10/10 /2022x1 dose IV Rocephin 10/10/ 2022x1 dose IV Zosyn 09/04/2021>>>>>   Subjective: Sitting up in bed.  States cough is more productive and yellowish.  Overall feeling better.  Feels shortness of breath is improved.  No chest pain.  No abdominal pain.  Stated had bowel movement yesterday feels might be having another 1 today.  O2 sats had to be increased per RN this morning currently on 15 L high flow O2 with sats of 91%.  Tolerating current diet.    Objective: Vitals:   09/09/21 0700 09/09/21 0725 09/09/21 0840 09/09/21 0843  BP: (!) 103/50     Pulse: 74     Resp: (!) 25     Temp:  98.5 F (36.9 C)    TempSrc:  Oral    SpO2: 95%  95% 91%  Weight:  Height:        Intake/Output Summary (Last 24 hours) at 09/09/2021 0955 Last data filed at 09/09/2021 0905 Gross per 24 hour  Intake 1361.44 ml  Output 1300 ml  Net 61.44 ml    Filed Weights   09/02/21 1000 09/02/21 1900 09/04/21 1000  Weight: 41.9 kg 41.9 kg 44 kg    Examination:  General exam: Cachectic.  Frail. Respiratory system: CTA B.  No wheezes, no crackles, no rhonchi.  Fair air  movement.  Speaking in full sentences.  No use of accessory muscles with respiration.  Cardiovascular system: RRR no murmurs rubs or gallops.  No JVD.  No lower extremity edema.  Gastrointestinal system: Abdomen is soft, mildly distended, nontender, positive bowel sounds.  No rebound.  No guarding.  Central nervous system: Alert and oriented.  Moving extremities spontaneously.  No focal neurological deficits.  Extremities: Symmetric 5 x 5 power. Skin: No rashes, lesions or ulcers Psychiatry: Judgement and insight appear normal. Mood & affect appropriate.     Data Reviewed: I have personally reviewed following labs and imaging studies  CBC: Recent Labs  Lab 09/05/21 0237 09/06/21 0237 09/07/21 0241 09/08/21 0254 09/09/21 0239  WBC 19.0* 17.3* 17.2* 18.6* 16.5*  NEUTROABS 15.3* 13.3* 13.5* 14.7*  --   HGB 8.8* 8.3* 8.9* 8.3* 8.6*  HCT 27.9* 25.7* 27.5* 26.0* 26.9*  MCV 91.8 87.7 88.7 88.1 89.7  PLT 517* 554* 552* 572* 603*     Basic Metabolic Panel: Recent Labs  Lab 09/04/21 0234 09/05/21 0237 09/06/21 0237 09/07/21 0241 09/08/21 0254 09/09/21 0239  NA 135 130* 132* 128* 128* 130*  K 3.1* 4.4 4.4 4.0 4.6 4.7  CL 101 97* 97* 92* 91* 92*  CO2 _0 GLUCOSE 119* 110* 97 124* 81 82  BUN _1 CREATININE 0.56 0.70 0.61 0.61 0.70 0.59  CALCIUM 8.6* 8.1* 8.1* 8.1* 7.9* 8.1*  MG 1.7 1.8 1.7 1.7 1.9  --   PHOS  --   --  2.5 2.7  --   --      GFR: Estimated Creatinine Clearance: 41.6 mL/min (by C-G formula based on SCr of 0.59 mg/dL).  Liver Function Tests: Recent Labs  Lab 09/06/21 0237 09/07/21 0241  ALBUMIN 2.1* 2.1*     CBG: No results for input(s): GLUCAP in the last 168 hours.   Recent Results (from the past 240 hour(s))  Blood culture (routine single)     Status: None   Collection Time: 09/01/21  2:15 PM   Specimen: BLOOD  Result Value Ref Range Status   Specimen Description   Final    BLOOD LEFT ANTECUBITAL Performed at  [facility] [address], [place], [place] [address]    Special Requests   Final    BOTTLES DRAWN AEROBIC AND ANAEROBIC Blood Culture adequate volume Performed at [facility] [address] [place], [place] [address]    Culture   Final    NO GROWTH 5 DAYS Performed at [facility], [place] [address] [place], [place] [address]    Report Status 09/06/2021 FINAL  Final  Resp Panel by RT-PCR (Flu A&B, Covid)     Status: None   Collection Time: 09/01/21  2:20 PM   Specimen: Nasopharyngeal(NP) swabs in vial transport medium  Result Value Ref Range Status   SARS Coronavirus 2 by RT PCR NEGATIVE NEGATIVE Final    Comment: (NOTE) SARS-CoV-2 target nucleic  acids are NOT DETECTED.  The SARS-CoV-2 RNA is generally detectable in upper respiratory specimens during the acute phase of infection. The lowest concentration of SARS-CoV-2 viral copies this assay can detect is 138 copies/mL. A negative result does not preclude SARS-Cov-2 infection and should not be used as the sole basis for treatment or other patient management decisions. A negative result may occur with  improper specimen collection/handling, submission of specimen other than nasopharyngeal swab, presence of viral mutation(s) within the areas targeted by this assay, and inadequate number of viral copies(<138 copies/mL). A negative result must be combined with clinical observations, patient history, and epidemiological information. The expected result is Negative.  Fact Sheet for Patients:  EntrepreneurPulse.com.au  Fact Sheet for Healthcare Providers:  IncredibleEmployment.be  This test is no t yet approved or cleared by the [place] FDA and  has been authorized for detection and/or diagnosis of SARS-CoV-2 by FDA under an Emergency Use Authorization (EUA). This EUA will remain  in effect (meaning this test can be used) for the duration of  the COVID-19 declaration under Section 564(b)(1) of the Act, 21 U.S.C.section 360bbb-3(b)(1), unless the authorization is terminated  or revoked sooner.       Influenza A by PCR NEGATIVE NEGATIVE Final   Influenza B by PCR NEGATIVE NEGATIVE Final    Comment: (NOTE) The Xpert Xpress SARS-CoV-2/FLU/RSV plus assay is intended as an aid in the diagnosis of influenza from Nasopharyngeal swab specimens and should not be used as a sole basis for treatment. Nasal washings and aspirates are unacceptable for Xpert Xpress SARS-CoV-2/FLU/RSV testing.  Fact Sheet for Patients: EntrepreneurPulse.com.au  Fact Sheet for Healthcare Providers: IncredibleEmployment.be  This test is not yet approved or cleared by the [place] FDA and has been authorized for detection and/or diagnosis of SARS-CoV-2 by FDA under an Emergency Use Authorization (EUA). This EUA will remain in effect (meaning this test can be used) for the duration of the COVID-19 declaration under Section 564(b)(1) of the Act, 21 U.S.C. section 360bbb-3(b)(1), unless the authorization is terminated or revoked.  Performed at [facility], [place] [address], [place], [place] [address]   Urine Culture     Status: Abnormal   Collection Time: 09/01/21  8:00 PM   Specimen: In/Out Cath Urine  Result Value Ref Range Status   Specimen Description   Final    IN/OUT CATH URINE Performed at [facility] [address], [place], [place] [address]    Special Requests   Final    NONE Performed at [facility] [place] [address] [place], [place] [address]    Culture MULTIPLE SPECIES PRESENT, SUGGEST RECOLLECTION (A)  Final   Report Status 09/02/2021 FINAL  Final  MRSA Next Gen by PCR, Nasal     Status: None   Collection Time: 09/02/21 11:34 PM   Specimen: Nasal Mucosa; Nasal Swab  Result Value Ref Range Status   MRSA by PCR Next Gen NOT DETECTED  NOT DETECTED Final    Comment: (NOTE) The GeneXpert MRSA Assay (FDA approved for NASAL specimens only), is one component of a comprehensive MRSA colonization surveillance program. It is not intended to diagnose MRSA infection nor to guide or monitor treatment for MRSA infections. Test performance is not FDA approved in patients less than 65 years old. Performed at [facility], [place] [address], [place], [place] [address]   Expectorated Sputum Assessment w Gram Stain, Rflx to Resp Cult     Status: None  Collection Time: 09/05/21 12:13 PM   Specimen: Sputum  Result Value Ref Range Status   Specimen Description SPU  Final   Special Requests NONE  Final   Sputum evaluation   Final    THIS SPECIMEN IS ACCEPTABLE FOR SPUTUM CULTURE Performed at [facility], [place] [address], [place], [place] [address]    Report Status 09/05/2021 FINAL  Final  Culture, Respiratory w Gram Stain     Status: None   Collection Time: 09/05/21 12:13 PM   Specimen: Sputum  Result Value Ref Range Status   Specimen Description   Final    SPU Performed at [facility] [address], [place], [place] [address]    Special Requests   Final    NONE Reflexed from [id number] Performed at [facility], [place] [address], [place], [place] [address]    Gram Stain   Final    ABUNDANT WBC PRESENT,BOTH PMN AND MONONUCLEAR FEW SQUAMOUS EPITHELIAL CELLS PRESENT NO ORGANISMS SEEN    Culture   Final    RARE Normal respiratory flora-no Staph aureus or Pseudomonas seen Performed at [facility], [address], [place], [place] [address]    Report Status 09/08/2021 FINAL  Final          Radiology Studies: No results found.      Scheduled Meds:  (feeding supplement) PROSource Plus  30 mL Oral BID BM   acetaminophen  500 mg Oral TID   amiodarone  200 mg Oral Daily   arformoterol  15 mcg Nebulization BID   bisacodyl  10 mg Rectal  Daily   budesonide (PULMICORT) nebulizer solution  0.5 mg Nebulization BID   busPIRone  10 mg Oral Daily   Chlorhexidine Gluconate Cloth  6 each Topical Daily   clopidogrel  75 mg Oral Daily   feeding supplement  237 mL Oral BID BM   fluticasone  2 spray Each Nare Daily   guaiFENesin  1,200 mg Oral BID   loratadine  10 mg Oral Daily   mouth rinse  15 mL Mouth Rinse BID   midodrine  5 mg Oral TID WC   multivitamin with minerals  1 tablet Oral Daily   pantoprazole  40 mg Oral Q0600   polyethylene glycol  17 g Oral BID   Rivaroxaban  15 mg Oral Q breakfast   Continuous Infusions:  sodium chloride Stopped (09/08/21 1414)   piperacillin-tazobactam (ZOSYN)  IV Stopped (09/09/21 0914)     LOS: 7 days    Time spent: 40 minutes    Irine Seal, MD [facility]   To contact the attending provider between 7A-7P or the covering provider during after hours 7P-7A, please log into the web site [email or web address] and access using universal [place] password for that web site. If you do not have the password, please call the hospital operator.  09/09/2021, 9:55 AM

## 2021-09-10 DIAGNOSIS — J9601 Acute respiratory failure with hypoxia: Secondary | ICD-10-CM | POA: Diagnosis not present

## 2021-09-10 DIAGNOSIS — J189 Pneumonia, unspecified organism: Secondary | ICD-10-CM | POA: Diagnosis not present

## 2021-09-10 LAB — RENAL FUNCTION PANEL
Albumin: 1.9 g/dL — ABNORMAL LOW (ref 3.5–5.0)
Anion gap: 7 (ref 5–15)
BUN: 16 mg/dL (ref 8–23)
CO2: 30 mmol/L (ref 22–32)
Calcium: 8 mg/dL — ABNORMAL LOW (ref 8.9–10.3)
Chloride: 92 mmol/L — ABNORMAL LOW (ref 98–111)
Creatinine, Ser: 0.64 mg/dL (ref 0.44–1.00)
GFR, Estimated: >60 mL/min (ref >60)
Glucose, Bld: 81 mg/dL (ref 70–99)
Phosphorus: 3.6 mg/dL (ref 2.5–4.6)
Potassium: 4.5 mmol/L (ref 3.5–5.1)
Sodium: 129 mmol/L — ABNORMAL LOW (ref 135–145)

## 2021-09-10 LAB — CBC WITH DIFFERENTIAL/PLATELET
Abs Immature Granulocytes: 0.23 10*3/uL — ABNORMAL HIGH (ref 0.00–0.07)
Basophils Absolute: 0.1 10*3/uL (ref 0.0–0.1)
Basophils Relative: 0 %
Eosinophils Absolute: 0.4 10*3/uL (ref 0.0–0.5)
Eosinophils Relative: 2 %
HCT: 25.5 % — ABNORMAL LOW (ref 36.0–46.0)
Hemoglobin: 8 g/dL — ABNORMAL LOW (ref 12.0–15.0)
Immature Granulocytes: 2 %
Lymphocytes Relative: 13 %
Lymphs Abs: 1.9 10*3/uL (ref 0.7–4.0)
MCH: 28.6 pg (ref 26.0–34.0)
MCHC: 31.4 g/dL (ref 30.0–36.0)
MCV: 91.1 fL (ref 80.0–100.0)
Monocytes Absolute: 1.1 10*3/uL — ABNORMAL HIGH (ref 0.1–1.0)
Monocytes Relative: 7 %
Neutro Abs: 11.3 10*3/uL — ABNORMAL HIGH (ref 1.7–7.7)
Neutrophils Relative %: 76 %
Platelets: 580 10*3/uL — ABNORMAL HIGH (ref 150–400)
RBC: 2.8 MIL/uL — ABNORMAL LOW (ref 3.87–5.11)
RDW: 14.3 % (ref 11.5–15.5)
WBC: 14.9 10*3/uL — ABNORMAL HIGH (ref 4.0–10.5)
nRBC: 0 % (ref 0.0–0.2)

## 2021-09-10 LAB — MAGNESIUM: Magnesium: 1.5 mg/dL — ABNORMAL LOW (ref 1.7–2.4)

## 2021-09-10 MED ORDER — ZINC OXIDE 40 % EX OINT
TOPICAL_OINTMENT | Freq: Three times a day (TID) | CUTANEOUS | Status: DC | PRN
  Filled 2021-09-10: qty 57

## 2021-09-10 MED ORDER — MAGNESIUM SULFATE 4 GM/100ML IV SOLN
4.0000 g | Freq: Once | INTRAVENOUS | Status: AC
  Administered 2021-09-10: 4 g via INTRAVENOUS
  Filled 2021-09-10: qty 100

## 2021-09-10 NOTE — Progress Notes (Signed)
PROGRESS NOTE    Anaalicia Reimann  FTD:[id number] DOB: November 21, 1946 DOA: 09/01/2021 PCP: [name], MD    Brief Narrative:  Kadee Philyaw is a 75 y.o. female with medical history significant of COPD with chronic hypoxic respiratory failure on 3 L/min oxygen at baseline, chronic A-fib, venous insufficiency, hx of transient hypotension, hospital admission in July of this year for aspiration pneumonia and acute on chronic hypoxic respiratory failure.  She was brought to [facility] ED from home by EMS due to ongoing diarrhea x 5 days, productive cough with associated right-sided chest pain and chills, with progressive generalized weakness.  Denies abdominal pain, nausea, vomiting, dysuria, focal weakness numbness or tingling, headaches or other recent symptoms.  Reports increasing her oxygen briefly to 3.5 L/min mostly due to feeling anxious.  Otherwise has been on her usual 3 L/min. Chest xray showed new extensive consolidation in the lateral right middle and lower lung, consistent with PNA, small right pleural effusion and chronic hyperexpansion compatible with emphysema. Patient is followed by the IM residency service at [facility], but this states she would prefer to be admitted here to [facility] this time. Patient admitted placed empirically on IV antibiotics, respiratory status worsen and IV antibiotics broadened, PCCM consulted and following.  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 -On admission patient noted to have met criteria for sepsis with leukocytosis, tachypnea, pneumonia. -Patient with a rising leukocytosis however started to slowly trend back down with broadening of IV antibiotic coverage..   -White count today down to 14.9 from a peak of 20 -Currently still requiring 15 L high flow O2, baseline is 3 L nasal cannula. -Blood cultures pending with no growth to date x5 days, MRSA PCR negative.  Urine cultures with multiple species. -Sputum gram stain and culture with no organisms seen. -Overall clinically improving, continue to wean O2 as tolerated -Completed 7-day course of Zosyn -Continue Brovana, Pulmicort, scheduled Xopenex and Atrovent nebs, Mucinex, Claritin, Flonase. -We will need repeat imaging in 6 to 8 weeks for resolution of dense right upper lobe pneumonia per PCCM recommendations.  2.  Hypotension -Patient with systolic blood pressures noted to be low in the 80s on 09/03/2021.   -Random cortisol 16.8 on 09/05/2021. -IV albumin every 6 hours x24 hours given with some improvement with blood pressure however blood pressure was soft on 09/05/2021 and patient became hypotensive requiring Levophed pressor which has been weaned off as of 09/06/2021.   -Patient started on midodrine per PCCM. -Systolic blood pressures in the low 100s. -Diuretics subsequently discontinued -Supportive care.   -PCCM following.    3.  Acute on chronic hypoxic respiratory failure in the setting of COPD likely secondary to aspiration pneumonia and probable volume overload/acute  diastolic CHF exacerbation/abnormal 2D echo -Likely secondary to pneumonia/aspiration pneumonia in the setting of  possible volume overload. -Had been BiPAP dependent, now on 15 L high flow O2 -Continue to wean O2 as tolerated -BNP obtained at 942.6. -2D echo from 10/23/2020 with a EF of 55 to 62%,VOJJ, grade 1 diastolic dysfunction, interventricular septum flattened in diastole consistent with right ventricular volume overload, low normal right ventricular systolic function, right ventricular size normal. -2D echo from 09/05/2021 with EF of 50%, left ventricle with wall motion abnormalities, left ventricular diastolic parameters indeterminate, interventricular septum is flattened in systole consistent with right ventricular pressure overload, mildly reduced right ventricular systolic function, right ventricular size is moderately to severely enlarged, moderately elevated pulmonary artery systolic pressure, right atrial size moderately dilated. -Blood pressure improved post IV albumin however patient remained hypotensive on 09/05/2021 requiring placement on pressors/Levophed.  -Levophed pressors weaned off 09/06/2021 and patient placed on midodrine. -Repeat chest x-ray from 09/04/2021 with increased confluent opacity over the right upper lobe with worsening variation throughout remainder of right lung concerning for worsening pneumonia with a persistent parapneumonic effusion.   -Repeat chest x-ray from 09/05/2021 with multilobular infiltrate and small effusions, most likely pneumonia, mild treatment right upper lobe infiltrate.  -ABG with a pH of 7.394, PCO2 of 44, PO2 of 58.5. -Status post Lasix 20 mg IV x1 on 09/04/2021 with urine output of 800 cc recorded.   -Diuretics now discontinued. -Strict I's and O's.  -BiPAP as needed.  -Continue scheduled Xopenex and Atrovent nebs, Pulmicort, Brovana, Claritin, PPI, Flonase -- Patient completed 7-day course of Zosyn -Cardiology had been following. -PCCM following  4.  Elevated troponin/abnormal 2D echo -Patient with acute on chronic respiratory distress on  09/04/2021, noted to have elevated BNP with concerns for volume overload.  Cardiac enzymes were elevated. -2D echo with EF of 50%, left ventricle with wall motion abnormalities, left ventricular diastolic parameters indeterminate, interventricular septum is flattened in systole consistent with right ventricular pressure overload, mildly reduced right ventricular systolic function, right ventricular size is moderately to severely enlarged, moderately elevated pulmonary artery systolic pressure, right atrial size moderately dilated. -Patient seen in consultation by cardiology this point in time suspect no need for further evaluation given her comorbid illness and frailty. -Patient noted to be hypotensive was on pressors which have been weaned off.  Hold off on starting beta-blocker.  -Cardiology recommending continuation of empiric therapy with Plavix with no plans for invasive evaluation at this time. -Cardiology has signed off and recommending outpatient follow-up with Dr. Johney Frame in the outpatient setting. -Currently now on midodrine  5.  Diarrhea>>>.  Constipation -Patient had reported 5 days of diarrhea at home prior to admission. -No bowel movement since admission. -Patient with complaints of constipation. -Received a dose of sorbitol on 09/05/2021 with no results.   -Patient placed on daily Dulcolax suppositories, MiraLAX twice daily with bowel movement noted 09/06/2021, 09/07/2021, 09/08/2021.   -Continue current bowel regimen as needed  6.  Paroxysmal atrial fibrillation -Amiodarone for rate control.   -Xarelto for anticoagulation.   -Cardiology following.  7.  Prolonged QTC -Patient noted to have a QTC of 560 on admission, chronically on amiodarone. -Repeat EKG with resolution of QT prolongation currently at 437. -Keep potassium approximately at 4, magnesium approximately at 2.  8.  Chronic hyponatremia -At baseline near baseline sodium -Stable  9.  Chronic venous stasis of  bilateral lower extremities/venous insufficiency with ulceration -Continue current wound treatment recommendations per [place] RN  10.  Hypomagnesemia/hypokalemia -Magnesium  1.9, potassium of 4.7.   -Follow.   11.  Moderate protein calorie malnutrition -Patient cachectic and frail on examination. -Albumin level at 2.3 on admission. -Diet liberalized.  -Status post IV albumin every 6 hours x1 day.   -Albumin currently at 2.1 (09/07/2021).   -Continue Ensure nutritional supplementation.   -RD consulted.  12.??  Pulmonary hypertension -2D echo concerning for pulmonary hypertension felt likely in the setting of group 3 disease with chronic respiratory failure/COPD per PCCM.  Patient would benefit from a sleep study in the outpatient setting.  13.  Goals of care -It is noted that palliative care was to follow-up with patient after recent discharge, patient is a DO NOT INTUBATE.  Patient does express desire for no CPR as she feels could break her ribs.  Patient is accepting of ACLS medications if needed.  CODE STATUS was later changed to limited code.   -Palliative care has assessed patient and current recommendations are to continue current plan, outpatient palliative referral to follow-up at home on discharge.    DVT prophylaxis: Xarelto Code Status: Partial Family Communication: Pt in room, family not at bedside  Status is: Inpatient  Remains inpatient appropriate because: severity of illness    Consultants:  WOC Phineas Douglas, RN 09/02/2021 PCCM: Dr.Olalere 09/04/2021 Cardiology: Dr. Percival Spanish 09/04/2021 Palliative care: Mariana Kaufman, NP 09/08/2021  Procedures:    Antimicrobials: Anti-infectives (From admission, onward)    Start     Dose/Rate Route Frequency Ordered Stop   09/04/21 1200  piperacillin-tazobactam (ZOSYN) IVPB 3.375 g  Status:  Discontinued        3.375 g 100 mL/hr over 30 Minutes Intravenous Every 6 hours 09/04/21 0955 09/04/21 0957   09/04/21 1100   piperacillin-tazobactam (ZOSYN) IVPB 3.375 g        3.375 g 12.5 mL/hr over 240 Minutes Intravenous Every 8 hours 09/04/21 0959 09/10/21 2359   09/02/21 1600  ampicillin-sulbactam (UNASYN) 1.5 g in sodium chloride 0.9 % 100 mL IVPB  Status:  Discontinued        1.5 g 200 mL/hr over 30 Minutes Intravenous Every 6 hours 09/02/21 1006 09/04/21 0955   09/02/21 1000  cefTRIAXone (ROCEPHIN) 2 g in sodium chloride 0.9 % 100 mL IVPB  Status:  Discontinued        2 g 200 mL/hr over 30 Minutes Intravenous Every 24 hours 09/01/21 1551 09/01/21 1819   09/02/21 1000  azithromycin (ZITHROMAX) 500 mg in sodium chloride 0.9 % 250 mL IVPB  Status:  Discontinued        500 mg 250 mL/hr over 60 Minutes Intravenous Every 24 hours 09/01/21 1551 09/01/21 1720   09/01/21 2359  Ampicillin-Sulbactam (UNASYN) 3 g in sodium chloride 0.9 % 100 mL IVPB  Status:  Discontinued        3 g 200 mL/hr over 30 Minutes Intravenous Every 8 hours 09/01/21 1823 09/02/21 1006   09/01/21 1615  cefTRIAXone (ROCEPHIN) 1 g in sodium chloride 0.9 % 100 mL IVPB  Status:  Discontinued        1 g 200 mL/hr over 30 Minutes Intravenous  Once 09/01/21 1602 09/01/21 1720   09/01/21 1445  azithromycin (ZITHROMAX) 500 mg in sodium chloride 0.9 % 250 mL IVPB        500 mg 250 mL/hr over 60 Minutes Intravenous Every 24 hours 09/01/21 1440 09/01/21 1606   09/01/21 1445  cefTRIAXone (ROCEPHIN) 1 g in sodium chloride 0.9 % 100 mL IVPB        1  g 200 mL/hr over 30 Minutes Intravenous Every 24 hours 09/01/21 1440 09/01/21 1532       Subjective: Reports gradually breathing better  Objective: Vitals:   09/10/21 1000 09/10/21 1100 09/10/21 1200 09/10/21 1300  BP: (!) 112/57 (!) 94/50 (!) 84/53 (!) 88/48  Pulse: 78 76 76 72  Resp: _0 Temp:      TempSrc:      SpO2: 100% 97% 94% 93%  Weight:      Height:        Intake/Output Summary (Last 24 hours) at 09/10/2021 1459 Last data filed at 09/10/2021 1209 Gross per 24 hour  Intake  704.5 ml  Output 2400 ml  Net -1695.5 ml   Filed Weights   09/02/21 1000 09/02/21 1900 09/04/21 1000  Weight: 41.9 kg 41.9 kg 44 kg    Examination: General exam: Awake, laying in bed, in nad Respiratory system: Normal respiratory effort, no wheezing Cardiovascular system: regular rate, s1, s2 Gastrointestinal system: Soft, nondistended, positive BS Central nervous system: CN2-12 grossly intact, strength intact Extremities: Perfused, no clubbing Skin: Normal skin turgor, no notable skin lesions seen Psychiatry: Mood normal // no visual hallucinations   Data Reviewed: I have personally reviewed following labs and imaging studies  CBC: Recent Labs  Lab 09/05/21 0237 09/06/21 0237 09/07/21 0241 09/08/21 0254 09/09/21 0239 09/10/21 0243  WBC 19.0* 17.3* 17.2* 18.6* 16.5* 14.9*  NEUTROABS 15.3* 13.3* 13.5* 14.7*  --  11.3*  HGB 8.8* 8.3* 8.9* 8.3* 8.6* 8.0*  HCT 27.9* 25.7* 27.5* 26.0* 26.9* 25.5*  MCV 91.8 87.7 88.7 88.1 89.7 91.1  PLT 517* 554* 552* 572* 603* 761*   Basic Metabolic Panel: Recent Labs  Lab 09/05/21 0237 09/06/21 0237 09/07/21 0241 09/08/21 0254 09/09/21 0239 09/10/21 0243  NA 130* 132* 128* 128* 130* 129*  K 4.4 4.4 4.0 4.6 4.7 4.5  CL 97* 97* 92* 91* 92* 92*  CO2 _1 GLUCOSE 110* 97 124* 81 82 81  BUN _2 CREATININE 0.70 0.61 0.61 0.70 0.59 0.64  CALCIUM 8.1* 8.1* 8.1* 7.9* 8.1* 8.0*  MG 1.8 1.7 1.7 1.9  --  1.5*  PHOS  --  2.5 2.7  --   --  3.6   GFR: Estimated Creatinine Clearance: 41.6 mL/min (by C-G formula based on SCr of 0.64 mg/dL). Liver Function Tests: Recent Labs  Lab 09/06/21 0237 09/07/21 0241 09/10/21 0243  ALBUMIN 2.1* 2.1* 1.9*   No results for input(s): LIPASE, AMYLASE in the last 168 hours. No results for input(s): AMMONIA in the last 168 hours. Coagulation Profile: No results for input(s): INR, PROTIME in the last 168 hours. Cardiac Enzymes: No results for input(s): CKTOTAL, CKMB,  CKMBINDEX, TROPONINI in the last 168 hours. BNP (last 3 results) No results for input(s): PROBNP in the last 8760 hours. HbA1C: No results for input(s): HGBA1C in the last 72 hours. CBG: No results for input(s): GLUCAP in the last 168 hours. Lipid Profile: No results for input(s): CHOL, HDL, LDLCALC, TRIG, CHOLHDL, LDLDIRECT in the last 72 hours. Thyroid Function Tests: No results for input(s): TSH, T4TOTAL, FREET4, T3FREE, THYROIDAB in the last 72 hours. Anemia Panel: No results for input(s): VITAMINB12, FOLATE, FERRITIN, TIBC, IRON, RETICCTPCT in the last 72 hours. Sepsis Labs: No results for input(s): PROCALCITON, LATICACIDVEN in the last 168 hours.  Recent Results (from the past 240 hour(s))  Blood culture (routine single)     Status: None  Collection Time: 09/01/21  2:15 PM   Specimen: BLOOD  Result Value Ref Range Status   Specimen Description   Final    BLOOD LEFT ANTECUBITAL Performed at [place] [address] [place], [place] [address]    Special Requests   Final    BOTTLES DRAWN AEROBIC AND ANAEROBIC Blood Culture adequate volume Performed at [place] [address], [place], [place] [address]    Culture   Final    NO GROWTH 5 DAYS Performed at [facility], [place] [address], [place], [place] [address]    Report Status 09/06/2021 FINAL  Final  Resp Panel by RT-PCR (Flu A&B, Covid)     Status: None   Collection Time: 09/01/21  2:20 PM   Specimen: Nasopharyngeal(NP) swabs in vial transport medium  Result Value Ref Range Status   SARS Coronavirus 2 by RT PCR NEGATIVE NEGATIVE Final    Comment: (NOTE) SARS-CoV-2 target nucleic acids are NOT DETECTED.  The SARS-CoV-2 RNA is generally detectable in upper respiratory specimens during the acute phase of infection. The lowest concentration of SARS-CoV-2 viral copies this assay can detect is 138 copies/mL. A negative result does not preclude SARS-Cov-2 infection and  should not be used as the sole basis for treatment or other patient management decisions. A negative result may occur with  improper specimen collection/handling, submission of specimen other than nasopharyngeal swab, presence of viral mutation(s) within the areas targeted by this assay, and inadequate number of viral copies(<138 copies/mL). A negative result must be combined with clinical observations, patient history, and epidemiological information. The expected result is Negative.  Fact Sheet for Patients:  EntrepreneurPulse.com.au  Fact Sheet for Healthcare Providers:  IncredibleEmployment.be  This test is no t yet approved or cleared by the [place] FDA and  has been authorized for detection and/or diagnosis of SARS-CoV-2 by FDA under an Emergency Use Authorization (EUA). This EUA will remain  in effect (meaning this test can be used) for the duration of the COVID-19 declaration under Section 564(b)(1) of the Act, 21 U.S.C.section 360bbb-3(b)(1), unless the authorization is terminated  or revoked sooner.       Influenza A by PCR NEGATIVE NEGATIVE Final   Influenza B by PCR NEGATIVE NEGATIVE Final    Comment: (NOTE) The Xpert Xpress SARS-CoV-2/FLU/RSV plus assay is intended as an aid in the diagnosis of influenza from Nasopharyngeal swab specimens and should not be used as a sole basis for treatment. Nasal washings and aspirates are unacceptable for Xpert Xpress SARS-CoV-2/FLU/RSV testing.  Fact Sheet for Patients: EntrepreneurPulse.com.au  Fact Sheet for Healthcare Providers: IncredibleEmployment.be  This test is not yet approved or cleared by the [place] FDA and has been authorized for detection and/or diagnosis of SARS-CoV-2 by FDA under an Emergency Use Authorization (EUA). This EUA will remain in effect (meaning this test can be used) for the duration of the COVID-19 declaration  under Section 564(b)(1) of the Act, 21 U.S.C. section 360bbb-3(b)(1), unless the authorization is terminated or revoked.  Performed at [facility], [place] [address] [place], [place] [address]   Urine Culture     Status: Abnormal   Collection Time: 09/01/21  8:00 PM   Specimen: In/Out Cath Urine  Result Value Ref Range Status   Specimen Description   Final    IN/OUT CATH URINE Performed at [facility] [address] [place], [place] [address]    Special Requests   Final  NONE Performed at [facility], [place] [address], [place], [place] [address]    Culture MULTIPLE SPECIES PRESENT, SUGGEST RECOLLECTION (A)  Final   Report Status 09/02/2021 FINAL  Final  MRSA Next Gen by PCR, Nasal     Status: None   Collection Time: 09/02/21 11:34 PM   Specimen: Nasal Mucosa; Nasal Swab  Result Value Ref Range Status   MRSA by PCR Next Gen NOT DETECTED NOT DETECTED Final    Comment: (NOTE) The GeneXpert MRSA Assay (FDA approved for NASAL specimens only), is one component of a comprehensive MRSA colonization surveillance program. It is not intended to diagnose MRSA infection nor to guide or monitor treatment for MRSA infections. Test performance is not FDA approved in patients less than 65 years old. Performed at [facility], [place] [address] [place], [place] [address]   Expectorated Sputum Assessment w Gram Stain, Rflx to Resp Cult     Status: None   Collection Time: 09/05/21 12:13 PM   Specimen: Sputum  Result Value Ref Range Status   Specimen Description SPU  Final   Special Requests NONE  Final   Sputum evaluation   Final    THIS SPECIMEN IS ACCEPTABLE FOR SPUTUM CULTURE Performed at [facility], [place] [address] [place], [place] [address]    Report Status 09/05/2021 FINAL  Final  Culture, Respiratory w Gram Stain     Status: None   Collection Time: 09/05/21 12:13 PM   Specimen: Sputum   Result Value Ref Range Status   Specimen Description   Final    SPU Performed at [facility] [address], [place], [place] [address]    Special Requests   Final    NONE Reflexed from [id number] Performed at [facility] [facility], [place] [address], [place], [place] [address]    Gram Stain   Final    ABUNDANT WBC PRESENT,BOTH PMN AND MONONUCLEAR FEW SQUAMOUS EPITHELIAL CELLS PRESENT NO ORGANISMS SEEN    Culture   Final    RARE Normal respiratory flora-no Staph aureus or Pseudomonas seen Performed at [facility], [address] [place], [place] [address]    Report Status 09/08/2021 FINAL  Final     Radiology Studies: No results found.  Scheduled Meds:  (feeding supplement) PROSource Plus  30 mL Oral BID BM   acetaminophen  500 mg Oral TID   amiodarone  200 mg Oral Daily   arformoterol  15 mcg Nebulization BID   bisacodyl  10 mg Rectal Daily   budesonide (PULMICORT) nebulizer solution  0.5 mg Nebulization BID   busPIRone  10 mg Oral Daily   Chlorhexidine Gluconate Cloth  6 each Topical Daily   clopidogrel  75 mg Oral Daily   feeding supplement  237 mL Oral BID BM   fluticasone  2 spray Each Nare Daily   guaiFENesin  1,200 mg Oral BID   loratadine  10 mg Oral Daily   mouth rinse  15 mL Mouth Rinse BID   midodrine  5 mg Oral TID WC   multivitamin with minerals  1 tablet Oral Daily   pantoprazole  40 mg Oral Q0600   polyethylene glycol  17 g Oral BID   Rivaroxaban  15 mg Oral Q breakfast   Continuous Infusions:  sodium chloride Stopped (09/08/21 1414)   piperacillin-tazobactam (ZOSYN)  IV 3.375 g (09/10/21 1350)     LOS: 8 days   Marylu Lund, MD [facility] Pager  On Amion  If 7PM-7AM, please contact night-coverage 09/10/2021, 2:59 PM

## 2021-09-10 NOTE — Progress Notes (Signed)
PT Cancellation Note  Patient Details Name: Asli Tokarski MRN: [id number] DOB: [birth year]   Cancelled Treatment:    Reason Eval/Treat Not Completed: Patient declined, up in recliner and eating and not ready to get back to bed.   Claretha Cooper 09/10/2021, 4:36 PM [place] Pager [phone] Office [phone]

## 2021-09-10 NOTE — Progress Notes (Signed)
Nutrition Follow-up  DOCUMENTATION CODES:   Severe malnutrition in context of chronic illness, Underweight  INTERVENTION:  - continue Prosource Plus BID and Ensure Plus/Ensure Enlive BID.    NUTRITION DIAGNOSIS:   Severe Malnutrition related to chronic illness (COPD) as evidenced by severe fat depletion, severe muscle depletion. -ongoing  GOAL:   Patient will meet greater than or equal to 90% of their needs -met on average  MONITOR:   PO intake, Supplement acceptance, Labs, Weight trends   ASSESSMENT:   75 y.o. female with medical history of COPD with chronic hypoxic respiratory failure on 3 L/min oxygen at baseline, chronic A-fib, venous insufficiency, hx of transient hypotension. She was admitted in 05/2021 for aspiration PNA and acute on chronic hypoxic respiratory failure. She presented to the ED via EMS due to 5 day hx of diarrhea, productive cough, R-sided chest pain, chills, and progressive weakness.  Patient discussed in rounds this AM.  She has been eating 50-75% at meals since 10/14. She is accepting Ensure 100% of the time offered and Prosource Plus 50% of the time offered.   Patient reports that she has a great appetite and no difficulty or discomfort eating. She enjoys strawberry and vanilla Ensure over ice. She requests butter be sent on each meal tray.   She has not been weighed since 10/13.non-pitting edema to BLE documented in the edema section of flow sheet.     Labs reviewed; Na: 129 mmol/l, Cl: 92 mmol/l, Ca: 8 mg/dl, Mg: 1.5 mg/dl.  Medications reviewed; 10 mg rectal dulcolax/day, 4 g IV Mg sulfate x1 run 10/19, 1 tablet multivitamin with minerals/day, 40 mg oral protonix/day, 17 g miralax BID.    Diet Order:   Diet Order             Diet regular Room service appropriate? Yes; Fluid consistency: Thin  Diet effective now                   EDUCATION NEEDS:   Education needs have been addressed  Skin:  Skin Assessment: Skin Integrity  Issues: Skin Integrity Issues:: Other (Comment) Other: venous stasis ulcers to L pretibial area x2  Last BM:  10/15  Height:   Ht Readings from Last 1 Encounters:  09/02/21 _0  (1.803 m)    Weight:   Wt Readings from Last 1 Encounters:  09/04/21 44 kg     Estimated Nutritional Needs:  Kcal:  1725-1975 kcal Protein:  85-100 grams Fluid:  >/= 2 L/day     Jarome [place], MS, RD, LDN, CNSC Inpatient Clinical Dietitian RD pager # available in AMION  After hours/weekend pager # available in [facility]

## 2021-09-10 NOTE — Progress Notes (Signed)
Occupational Therapy Treatment Patient Details Name: Kristen [name] MRN: [id number] DOB: 23-Mar-1946 Today's Date: 09/10/2021   History of present illness Pt is 75 yo female who presented to [facility] ED from home by EMS due to ongoing diarrhea x 5 days, productive cough with associated right-sided chest pain and chills, with progressive generalized weakness. Medical history significant of COPD with chronic hypoxic respiratory failure on 3 L/min oxygen at baseline, chronic A-fib, venous insufficiency, hx of transient hypotension, hospital admission in July of this year for aspiration pneumonia and acute on chronic hypoxic respiratory failure.   OT comments  Treatment focused on activity tolerance and toileting. Patient found on 15 L HFNC and needs increased time to perform physical task and increased time to recover from tasks but only mild dyspnea. O2 sat and HR WFL during the entirety of treatment. Patient max assist for toileting on [facility] needing assistance for clothing management and cleaning up after getting feces on her hand. Patient mod assist to stand and transfer to [facility] the mod assist to stand from [facility]. Min x 2 to take steps to recliner. Patient still adamant about going home. Patient making progress with less dyspnea today. Cont POC.    Recommendations for follow up therapy are one component of a multi-disciplinary discharge planning process, led by the attending physician.  Recommendations may be updated based on patient status, additional functional criteria and insurance authorization.    Follow Up Recommendations  Home health OT;Supervision/Assistance - 24 hour    Equipment Recommendations  Other (comment) (TBD)    Recommendations for Other Services      Precautions / Restrictions Precautions Precautions: Fall Precaution Comments: monitor O2 and HR, 15 L HFNC Restrictions Weight Bearing Restrictions: No       Mobility Bed Mobility Overal bed mobility: Needs Assistance Bed Mobility:  Supine to Sit     Supine to sit: Supervision;HOB elevated     General bed mobility comments: increased time to transfer to side of bed.    Transfers Overall transfer level: Needs assistance Equipment used: None;2 person hand held assist Transfers: Sit to/from Stand Sit to Stand: Mod assist Stand pivot transfers: Min assist;+2 safety/equipment       General transfer comment: Mod assist to power up from bed and BSC. Mod assist to pivot to [facility]. Min assist with two hand holds to take steps from [facility] to recliner. O2 sat and HR maintained WFL while performing toileting.    Balance Overall balance assessment: Needs assistance Sitting-balance support: Bilateral upper extremity supported;Feet supported Sitting balance-Leahy Scale: Good     Standing balance support: Bilateral upper extremity supported Standing balance-Leahy Scale: Poor Standing balance comment: reliant on UEs                           ADL either performed or assessed with clinical judgement   ADL Overall ADL's : Needs assistance/impaired                         Toilet Transfer: Moderate assistance;+2 for safety/equipment;BSC;Squat-pivot Toilet Transfer Details (indicate cue type and reason): Mod assist with second person close by and provide additional verbal cues to pivot to [facility]. Toileting- Clothing Manipulation and Hygiene: Maximal assistance;Sitting/lateral lean Toileting - Clothing Manipulation Details (indicate cue type and reason): Max assist for toileting. Able to clean self in seated position but ended up getting feces all over her hand. Needed assistance for additional clean up and clothing management.  Vision Patient Visual Report: No change from baseline     Perception     Praxis      Cognition Arousal/Alertness: Awake/alert Behavior During Therapy: WFL for tasks assessed/performed Overall Cognitive Status: Within Functional Limits for tasks assessed                                           Exercises     Shoulder Instructions       General Comments      Pertinent Vitals/ Pain       Pain Assessment: No/denies pain  Home Living                                          Prior Functioning/Environment              Frequency  Min 2X/week        Progress Toward Goals  OT Goals(current goals can now be found in the care plan section)  Progress towards OT goals: Progressing toward goals  Acute Rehab OT Goals Patient Stated Goal: to  breathe better, go home OT Goal Formulation: With patient Time For Goal Achievement: 09/17/21 Potential to Achieve Goals: Good  Plan Discharge plan remains appropriate    Co-evaluation                 AM-PAC OT "6 Clicks" Daily Activity     Outcome Measure   Help from another person eating meals?: A Little Help from another person taking care of personal grooming?: A Little Help from another person toileting, which includes using toliet, bedpan, or urinal?: A Lot Help from another person bathing (including washing, rinsing, drying)?: A Lot Help from another person to put on and taking off regular upper body clothing?: A Little Help from another person to put on and taking off regular lower body clothing?: A Lot 6 Click Score: 15    End of Session Equipment Utilized During Treatment: Oxygen  OT Visit Diagnosis: Unsteadiness on feet (R26.81);Pain;Muscle weakness (generalized) (M62.81)   Activity Tolerance Patient tolerated treatment well   Patient Left in chair;with call bell/phone within reach   Nurse Communication Mobility status        Time: 1420-1455 OT Time Calculation (min): 35 min  Charges: OT General Charges $OT Visit: 1 Visit OT Treatments $Self Care/Home Management : 23-37 mins  Dimitry Holsworth, OTR/L [place]  Office [phone] Pager: [place] 09/10/2021, 4:05 PM

## 2021-09-10 NOTE — Progress Notes (Signed)
NAME:  Kristen [name], MRN:  [id number], DOB:  08/19/1946, LOS: 47 ADMISSION DATE:  09/01/2021, CONSULTATION DATE:  09/04/2021 REFERRING MD:  Dr. Grandville Silos, CHIEF COMPLAINT:  Resp failure   History of Present Illness:  75 year old female with past medical history significant for former smoker, COPD, chronic hypoxic respiratory failure on home O2 3L [place], HFpEF, Afib on Xarelto, venous/ arterial insufficiency, and protein calorie malnutrition admitted to [facility] on 10/10 with sepsis, hypoxic respiratory failure, and pneumonia.   She presented to ER due to progressive generalized weakness, right sided chest pain, chills, productive cough with greenish-brown sputum, and diarrhea x 5 days.   She reports she was trying to avoid coming to the hospital and waited but now regrets waiting.  Had increased her home O2 to 3.5 L mostly due to feeling anxious.  She denies any fever, hemoptysis, dysphagia, or choking episodes.  She is divorced, lives with her sister, and has two children who live in [place].   Found to be afebrile with with soft blood pressures, improved after albumin, and labs showing leukocytosis, Na 131, BNP 942, Flu/ SARs neg, negative urine legionella and strep, and CXR with new extensive consolidation in the lateral right midlung and right base c/w with pneumonia, chronic hyperexpansion, and small right pleural effusion.  She since has had an increasing O2 requirement, placed on high flow [place] but now with trial of BiPAP.  Started on low dose lasix this morning for volume overload.  She was treated in July 2022 for an aspiration pneumonia, therefore started on CAP coverage, changed to unasyn but coverage expanded to zosyn today given CXR progression.  No further diarrhea since admission.  This is her fifth hospitalization this year for respiratory failure related to COPD and/ or aspiration pneumonia.  She is a DNI.  PCCM consulted for further recommendations.  Pertinent  Medical History  Former smoker, COPD,  chronic hypoxic respiratory failure on home O2 3L [place], Afib on Xarelto, venous/ arterial insufficiency, protein calorie malnutrition, HFpEF, hyponatremia, COVID 05/2021  Significant Hospital Events: Including procedures, antibiotic start and stop dates in addition to other pertinent events   10/10 admitted with sepsis, pna 10/13 PCCM consulted  10/14 required intermittent levophed peripherally 10/16 midodrine started for intermittently low Bps, speech therapy consult without overt aspiration risk Respiratory culture 10/14 >> normal flora.  Blood culture 10/10 negative  Interim History / Subjective:   Na drifting down > 129 WBC continues to improve 14.9 I/O + 480cc total  Objective   Blood pressure (!) 90/49, pulse 76, temperature 97.6 F (36.4 C), temperature source Axillary, resp. rate 20, height _0  (1.803 m), weight 44 kg, SpO2 (!) 87 %.    FiO2 (%):  [60 %] 60 %   Intake/Output Summary (Last 24 hours) at 09/10/2021 0747 Last data filed at 09/10/2021 0400 Gross per 24 hour  Intake 658.79 ml  Output 2550 ml  Net -1891.21 ml   Filed Weights   09/02/21 1000 09/02/21 1900 09/04/21 1000  Weight: 41.9 kg 41.9 kg 44 kg   Examination: General: Very thin, laying in bed comfortably with O2 in place HEENT: Oropharynx moist, no oral lesions, no stridor Neuro: Awake, alert, well-oriented, interacting appropriately, follows commands, good strength CV: Regular, distant, no murmur PULM: Coarse breath sounds on the right, clear on the left, no wheezing.  Comfortable respiratory pattern.  Able to converse without shortness of breath GI: Nondistended, positive bowel sounds Extremities: No edema Skin: No rash  Resolved Hospital Problem list  Assessment & Plan:  Septic Shock - resolved Intermittent Hypotension -Tolerating midodrine, would continue.  Could uptitrate depending on blood pressure trend -Fluid resuscitation if transient hypotension  Acute on Chronic Hypoxemic  Respiratory Failure ?Pulmonary Hypertension Pneumonia COPD, 3L O2 baseline Chronic rhinitis -has not required BiPAP last few days, can remove from orders -agree w 7 days zosyn -pulm hygiene, mucinex -on Brovana + pulmicort > change back to incruse + dulera when ready for her home regimen. Duoneb prn  -currently on xopenex prn given her A fib during the hospitalization -She remains on 12 L/min by high flow nasal cannula, working to wean but no progress over the last 24 hours.  Suspect it will take time for her pneumonia to fully clear and for her recovery to plateau.  She very much wants to avoid going to SNF for rehab, but her O2 needs may dictate whether she can go home safely. -May benefit from an outpatient sleep study -Agree with palliative care support, DNR/I status -Loratadine and fluticasone nasal spray for her chronic rhinitis   Remaining per primary service Diarrhea- resolved PAF- on xarelto, amiodarone, in NSR Prolonged Qtc Chronic hyponatremia Moderate protein calorie malnutrition   Best Practice (right click and "Reselect all SmartList Selections" daily)   Diet/type: Regular consistency (see orders) DVT prophylaxis: DOAC GI prophylaxis: PPI Lines: N/A Foley:  N/A Code Status:  limited Last date of multidisciplinary goals of care discussion [per primary]  Labs   CBC: Recent Labs  Lab 09/05/21 0237 09/06/21 0237 09/07/21 0241 09/08/21 0254 09/09/21 0239 09/10/21 0243  WBC 19.0* 17.3* 17.2* 18.6* 16.5* 14.9*  NEUTROABS 15.3* 13.3* 13.5* 14.7*  --  11.3*  HGB 8.8* 8.3* 8.9* 8.3* 8.6* 8.0*  HCT 27.9* 25.7* 27.5* 26.0* 26.9* 25.5*  MCV 91.8 87.7 88.7 88.1 89.7 91.1  PLT 517* 554* 552* 572* 603* 580*    Basic Metabolic Panel: Recent Labs  Lab 09/05/21 0237 09/06/21 0237 09/07/21 0241 09/08/21 0254 09/09/21 0239 09/10/21 0243  NA 130* 132* 128* 128* 130* 129*  K 4.4 4.4 4.0 4.6 4.7 4.5  CL 97* 97* 92* 91* 92* 92*  CO2 _0 GLUCOSE  110* 97 124* 81 82 81  BUN _1 CREATININE 0.70 0.61 0.61 0.70 0.59 0.64  CALCIUM 8.1* 8.1* 8.1* 7.9* 8.1* 8.0*  MG 1.8 1.7 1.7 1.9  --  1.5*  PHOS  --  2.5 2.7  --   --  3.6   GFR: Estimated Creatinine Clearance: 41.6 mL/min (by C-G formula based on SCr of 0.64 mg/dL). Recent Labs  Lab 09/07/21 0241 09/08/21 0254 09/09/21 0239 09/10/21 0243  WBC 17.2* 18.6* 16.5* 14.9*    Liver Function Tests: Recent Labs  Lab 09/06/21 0237 09/07/21 0241 09/10/21 0243  ALBUMIN 2.1* 2.1* 1.9*   No results for input(s): LIPASE, AMYLASE in the last 168 hours. No results for input(s): AMMONIA in the last 168 hours.  ABG    Component Value Date/Time   PHART 7.394 09/04/2021 0811   PCO2ART 44.3 09/04/2021 0811   PO2ART 58.5 (L) 09/04/2021 0811   HCO3 26.4 09/04/2021 0811   TCO2 42 (H) 02/22/2021 1655   ACIDBASEDEF 2.0 06/28/2020 1750   O2SAT 89.6 09/04/2021 0811     Coagulation Profile: No results for input(s): INR, PROTIME in the last 168 hours.   Cardiac Enzymes: No results for input(s): CKTOTAL, CKMB, CKMBINDEX, TROPONINI in the last 168 hours.  HbA1C: Hgb A1c MFr Bld  Date/Time Value Ref Range Status  06/29/2020 04:27 PM 5.6 4.8 - 5.6 % Final    Comment:    (NOTE) Pre diabetes:          5.7%-6.4%  Diabetes:              >6.4%  Glycemic control for   <7.0% adults with diabetes     CBG: No results for input(s): GLUCAP in the last 168 hours.  Critical care time: n/a    Please call if we can assist further.  Baltazar Apo, MD, PhD 09/10/2021, 7:47 AM [facility] [phone] or if no answer before 7:00PM call [phone] For any issues after 7:00PM please call eLink [phone]

## 2021-09-10 NOTE — Progress Notes (Signed)
Mg 1.5 Replaced per protocol

## 2021-09-11 DIAGNOSIS — J9611 Chronic respiratory failure with hypoxia: Secondary | ICD-10-CM | POA: Diagnosis not present

## 2021-09-11 LAB — CBC
HCT: 25.7 % — ABNORMAL LOW (ref 36.0–46.0)
Hemoglobin: 8 g/dL — ABNORMAL LOW (ref 12.0–15.0)
MCH: 28.5 pg (ref 26.0–34.0)
MCHC: 31.1 g/dL (ref 30.0–36.0)
MCV: 91.5 fL (ref 80.0–100.0)
Platelets: 641 10*3/uL — ABNORMAL HIGH (ref 150–400)
RBC: 2.81 MIL/uL — ABNORMAL LOW (ref 3.87–5.11)
RDW: 14.4 % (ref 11.5–15.5)
WBC: 14.9 10*3/uL — ABNORMAL HIGH (ref 4.0–10.5)
nRBC: 0 % (ref 0.0–0.2)

## 2021-09-11 LAB — COMPREHENSIVE METABOLIC PANEL
ALT: 9 U/L (ref 0–44)
AST: 11 U/L — ABNORMAL LOW (ref 15–41)
Albumin: 1.9 g/dL — ABNORMAL LOW (ref 3.5–5.0)
Alkaline Phosphatase: 105 U/L (ref 38–126)
Anion gap: 6 (ref 5–15)
BUN: 17 mg/dL (ref 8–23)
CO2: 33 mmol/L — ABNORMAL HIGH (ref 22–32)
Calcium: 7.8 mg/dL — ABNORMAL LOW (ref 8.9–10.3)
Chloride: 88 mmol/L — ABNORMAL LOW (ref 98–111)
Creatinine, Ser: 0.64 mg/dL (ref 0.44–1.00)
GFR, Estimated: >60 mL/min (ref >60)
Glucose, Bld: 90 mg/dL (ref 70–99)
Potassium: 4.7 mmol/L (ref 3.5–5.1)
Sodium: 127 mmol/L — ABNORMAL LOW (ref 135–145)
Total Bilirubin: 0.4 mg/dL (ref 0.3–1.2)
Total Protein: 5.3 g/dL — ABNORMAL LOW (ref 6.5–8.1)

## 2021-09-11 LAB — OSMOLALITY: Osmolality: 275 mOsm/kg (ref 275–295)

## 2021-09-11 MED ORDER — MAGNESIUM SULFATE 2 GM/50ML IV SOLN
2.0000 g | Freq: Once | INTRAVENOUS | Status: AC
  Administered 2021-09-11: 2 g via INTRAVENOUS
  Filled 2021-09-11: qty 50

## 2021-09-11 NOTE — Progress Notes (Signed)
PT Cancellation Note  Patient Details Name: Kristen [name] MRN: [id number] DOB: May 06, 1946   Cancelled Treatment:    Reason Eval/Treat Not Completed: Patient declined x 3 :in AM wanted to rest, then friend coming then friend in room. Will check back tomorrow.  Claretha Cooper 09/11/2021, 3:25 PM [place] Pager [phone] Office [phone]

## 2021-09-11 NOTE — Plan of Care (Signed)

## 2021-09-11 NOTE — Progress Notes (Signed)
PROGRESS NOTE    Kristen [name]  WUX:[id number] DOB: Jul 17, 1946 DOA: 09/01/2021 PCP: [name], MD    Brief Narrative:  Kristen [name] is a 75 y.o. female with medical history significant of COPD with chronic hypoxic respiratory failure on 3 L/min oxygen at baseline, chronic A-fib, venous insufficiency, hx of transient hypotension, hospital admission in July of this year for aspiration pneumonia and acute on chronic hypoxic respiratory failure.  She was brought to [facility] ED from home by EMS due to ongoing diarrhea x 5 days, productive cough with associated right-sided chest pain and chills, with progressive generalized weakness.  Denies abdominal pain, nausea, vomiting, dysuria, focal weakness numbness or tingling, headaches or other recent symptoms.  Reports increasing her oxygen briefly to 3.5 L/min mostly due to feeling anxious.  Otherwise has been on her usual 3 L/min. Chest xray showed new extensive consolidation in the lateral right middle and lower lung, consistent with PNA, small right pleural effusion and chronic hyperexpansion compatible with emphysema. Patient is followed by the IM residency service at [facility], but this states she would prefer to be admitted here to [facility] this time. Patient admitted placed empirically on IV antibiotics, respiratory status worsen and IV antibiotics broadened, PCCM consulted and following.  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 -On admission patient noted to have met criteria for sepsis with leukocytosis, tachypnea, pneumonia. -Patient with a rising leukocytosis however started to slowly trend back down with broadening of IV antibiotic coverage..   -White count today down to 14.9 from a peak of 20 -This AM required down to 10L high flow O2, baseline is 3 L nasal cannula. -Blood cultures pending with no growth to date x5 days, MRSA PCR negative.  Urine cultures with multiple species. -Sputum gram stain and culture with no organisms seen. -Clinically still improving, continue to wean O2 as tolerated -Completed 7-day course of Zosyn -Continue Brovana, Pulmicort, scheduled Xopenex and Atrovent nebs, Mucinex, Claritin, Flonase. -Recommend repeat imaging in 6 to 8 weeks for resolution of dense right upper lobe pneumonia per PCCM recommendations.  2.  Hypotension -Patient with systolic blood pressures noted to be low in the 80s on 09/03/2021.   -Random cortisol 16.8 on 09/05/2021. -IV albumin every 6 hours x24 hours given with some improvement with blood pressure however blood pressure was soft on 09/05/2021 and patient became hypotensive requiring Levophed pressor which has been weaned off as of 09/06/2021.   -Patient started on midodrine per PCCM. -Systolic blood pressures overall stable -Diuretics subsequently discontinued -Cont with supportive care.   -PCCM following.    3.  Acute on chronic hypoxic respiratory failure in the setting of COPD likely secondary to aspiration pneumonia and probable volume overload/acute diastolic  CHF exacerbation/abnormal 2D echo -Likely secondary to pneumonia/aspiration pneumonia in the setting of  possible volume overload. -Had been BiPAP dependent, now on 15 L high flow O2 -Continue to wean O2 as tolerated -BNP obtained at 942.6. -2D echo from 10/23/2020 with a EF of 55 to 66%,ZLDJ, grade 1 diastolic dysfunction, interventricular septum flattened in diastole consistent with right ventricular volume overload, low normal right ventricular systolic function, right ventricular size normal. -2D echo from 09/05/2021 with EF of 50%, left ventricle with wall motion abnormalities, left ventricular diastolic parameters indeterminate, interventricular septum is flattened in systole consistent with right ventricular pressure overload, mildly reduced right ventricular systolic function, right ventricular size is moderately to severely enlarged, moderately elevated pulmonary artery systolic pressure, right atrial size moderately dilated. -Blood pressure improved post IV albumin however patient remained hypotensive on 09/05/2021 requiring placement on pressors/Levophed.  -Levophed pressors weaned off 09/06/2021 and patient placed on midodrine. -Repeat chest x-ray from 09/04/2021 with increased confluent opacity over the right upper lobe with worsening variation throughout remainder of right lung concerning for worsening pneumonia with a persistent parapneumonic effusion.   -Repeat chest x-ray from 09/05/2021 with multilobular infiltrate and small effusions, most likely pneumonia, mild treatment right upper lobe infiltrate.  -ABG with a pH of 7.394, PCO2 of 44, PO2 of 58.5. -Status post Lasix 20 mg IV x1 on 09/04/2021 with urine output of 800 cc recorded.   -Diuretics now discontinued. -Strict I's and O's.  -BiPAP as needed.  -Continue scheduled Xopenex and Atrovent nebs, Pulmicort, Brovana, Claritin, PPI, Flonase -- Patient completed 7-day course of Zosyn -Cardiology had been following. -PCCM had been following  4.  Elevated troponin/abnormal 2D echo -Patient with acute on chronic respiratory distress  on 09/04/2021, noted to have elevated BNP with concerns for volume overload.  Cardiac enzymes were elevated. -2D echo with EF of 50%, left ventricle with wall motion abnormalities, left ventricular diastolic parameters indeterminate, interventricular septum is flattened in systole consistent with right ventricular pressure overload, mildly reduced right ventricular systolic function, right ventricular size is moderately to severely enlarged, moderately elevated pulmonary artery systolic pressure, right atrial size moderately dilated. -Patient seen in consultation by cardiology this point in time suspect no need for further evaluation given her comorbid illness and frailty. -Patient noted to be hypotensive was on pressors which have been weaned off.  Hold off on starting beta-blocker.  -Cardiology recommending continuation of empiric therapy with Plavix with no plans for invasive evaluation at this time. -Cardiology has signed off and recommending outpatient follow-up with Dr. Johney Frame in the outpatient setting. -Continued on midodrine  5.  Diarrhea>>>.  Constipation -Patient had reported 5 days of diarrhea at home prior to admission. -No bowel movement since admission. -Patient with complaints of constipation. -Received a dose of sorbitol on 09/05/2021 with no results.   -Patient placed on daily Dulcolax suppositories, MiraLAX twice daily with bowel movement noted 09/06/2021, 09/07/2021, 09/08/2021.   -Continue current bowel regimen as needed  6.  Paroxysmal atrial fibrillation -Amiodarone for rate control.   -Xarelto for anticoagulation.   -Cardiology had been following earlier.  7.  Prolonged QTC -Patient noted to have a QTC of 560 on admission, chronically on amiodarone. -Repeat EKG with resolution of QT prolongation currently at 437. -Keep potassium approximately at 4, magnesium approximately at 2.  8.  Chronic hyponatremia -Sodium down 127 today -Has documented hx of chronic  hyponatremia. Clinically stable -Have ordered serum and urine osm as well as urine Na  9.  Chronic venous stasis  of bilateral lower extremities/venous insufficiency with ulceration -Continue current wound treatment recommendations per [place] RN  10.  Hypomagnesemia/hypokalemia -Replaced  -Follow repeat lytes  11.  Moderate protein calorie malnutrition -Patient cachectic and frail on examination. -Albumin level at 2.3 on admission. -Diet liberalized.  -Status post IV albumin every 6 hours x1 day.   -Albumin currently at 2.1 (09/07/2021).   -Continue Ensure nutritional supplementation.   -RD was consulted.  12.??  Pulmonary hypertension -2D echo concerning for pulmonary hypertension felt likely in the setting of group 3 disease with chronic respiratory failure/COPD per PCCM.  Patient would benefit from a sleep study in the outpatient setting.  13.  Goals of care -It is noted that palliative care was to follow-up with patient after recent discharge, patient is a DO NOT INTUBATE.  Patient does express desire for no CPR as she feels could break her ribs.  Patient is accepting of ACLS medications if needed.  CODE STATUS was later changed to limited code.   -Palliative care has assessed patient and current recommendations are to continue current plan, recommend outpatient palliative referral to follow-up at home on discharge.    DVT prophylaxis: Xarelto Code Status: Partial Family Communication: Pt in room, family not at bedside  Status is: Inpatient  Remains inpatient appropriate because: severity of illness    Consultants:  WOC Phineas Douglas, RN 09/02/2021 PCCM: Dr.Olalere 09/04/2021 Cardiology: Dr. Percival Spanish 09/04/2021 Palliative care: Mariana Kaufman, NP 09/08/2021  Procedures:    Antimicrobials: Anti-infectives (From admission, onward)    Start     Dose/Rate Route Frequency Ordered Stop   09/04/21 1200  piperacillin-tazobactam (ZOSYN) IVPB 3.375 g  Status:  Discontinued         3.375 g 100 mL/hr over 30 Minutes Intravenous Every 6 hours 09/04/21 0955 09/04/21 0957   09/04/21 1100  piperacillin-tazobactam (ZOSYN) IVPB 3.375 g        3.375 g 12.5 mL/hr over 240 Minutes Intravenous Every 8 hours 09/04/21 0959 09/11/21 0000   09/02/21 1600  ampicillin-sulbactam (UNASYN) 1.5 g in sodium chloride 0.9 % 100 mL IVPB  Status:  Discontinued        1.5 g 200 mL/hr over 30 Minutes Intravenous Every 6 hours 09/02/21 1006 09/04/21 0955   09/02/21 1000  cefTRIAXone (ROCEPHIN) 2 g in sodium chloride 0.9 % 100 mL IVPB  Status:  Discontinued        2 g 200 mL/hr over 30 Minutes Intravenous Every 24 hours 09/01/21 1551 09/01/21 1819   09/02/21 1000  azithromycin (ZITHROMAX) 500 mg in sodium chloride 0.9 % 250 mL IVPB  Status:  Discontinued        500 mg 250 mL/hr over 60 Minutes Intravenous Every 24 hours 09/01/21 1551 09/01/21 1720   09/01/21 2359  Ampicillin-Sulbactam (UNASYN) 3 g in sodium chloride 0.9 % 100 mL IVPB  Status:  Discontinued        3 g 200 mL/hr over 30 Minutes Intravenous Every 8 hours 09/01/21 1823 09/02/21 1006   09/01/21 1615  cefTRIAXone (ROCEPHIN) 1 g in sodium chloride 0.9 % 100 mL IVPB  Status:  Discontinued        1 g 200 mL/hr over 30 Minutes Intravenous  Once 09/01/21 1602 09/01/21 1720   09/01/21 1445  azithromycin (ZITHROMAX) 500 mg in sodium chloride 0.9 % 250 mL IVPB        500 mg 250 mL/hr over 60 Minutes Intravenous Every 24 hours 09/01/21 1440 09/01/21 1606   09/01/21 1445  cefTRIAXone (ROCEPHIN)  1 g in sodium chloride 0.9 % 100 mL IVPB        1 g 200 mL/hr over 30 Minutes Intravenous Every 24 hours 09/01/21 1440 09/01/21 1532       Subjective: Reports gradually breathing better  Objective: Vitals:   09/11/21 0900 09/11/21 1000 09/11/21 1100 09/11/21 1200  BP:    (!) 101/59  Pulse: 79 74 70 78  Resp: (!) _0 Temp:      TempSrc:      SpO2: 98% 97% 94% 91%  Weight:      Height:        Intake/Output Summary (Last 24 hours)  at 09/11/2021 1331 Last data filed at 09/11/2021 0800 Gross per 24 hour  Intake 88.48 ml  Output 1700 ml  Net -1611.52 ml    Filed Weights   09/02/21 1900 09/04/21 1000 09/10/21 1746  Weight: 41.9 kg 44 kg 46.3 kg    Examination: General exam: Conversant, in no acute distress Respiratory system: normal chest rise, clear, no audible wheezing Cardiovascular system: regular rhythm, s1-s2 Gastrointestinal system: Nondistended, nontender, pos BS Central nervous system: No seizures, no tremors Extremities: No cyanosis, no joint deformities Skin: No rashes, no pallor Psychiatry: Affect normal // no auditory hallucinations    Data Reviewed: I have personally reviewed following labs and imaging studies  CBC: Recent Labs  Lab 09/05/21 0237 09/06/21 0237 09/07/21 0241 09/08/21 0254 09/09/21 0239 09/10/21 0243 09/11/21 0238  WBC 19.0* 17.3* 17.2* 18.6* 16.5* 14.9* 14.9*  NEUTROABS 15.3* 13.3* 13.5* 14.7*  --  11.3*  --   HGB 8.8* 8.3* 8.9* 8.3* 8.6* 8.0* 8.0*  HCT 27.9* 25.7* 27.5* 26.0* 26.9* 25.5* 25.7*  MCV 91.8 87.7 88.7 88.1 89.7 91.1 91.5  PLT 517* 554* 552* 572* 603* 580* 641*    Basic Metabolic Panel: Recent Labs  Lab 09/05/21 0237 09/06/21 0237 09/07/21 0241 09/08/21 0254 09/09/21 0239 09/10/21 0243 09/11/21 0238  NA 130* 132* 128* 128* 130* 129* 127*  K 4.4 4.4 4.0 4.6 4.7 4.5 4.7  CL 97* 97* 92* 91* 92* 92* 88*  CO2 _1 33*  GLUCOSE 110* 97 124* 81 82 81 90  BUN _2 CREATININE 0.70 0.61 0.61 0.70 0.59 0.64 0.64  CALCIUM 8.1* 8.1* 8.1* 7.9* 8.1* 8.0* 7.8*  MG 1.8 1.7 1.7 1.9  --  1.5*  --   PHOS  --  2.5 2.7  --   --  3.6  --     GFR: Estimated Creatinine Clearance: 43.7 mL/min (by C-G formula based on SCr of 0.64 mg/dL). Liver Function Tests: Recent Labs  Lab 09/06/21 0237 09/07/21 0241 09/10/21 0243 09/11/21 0238  AST  --   --   --  11*  ALT  --   --   --  9  ALKPHOS  --   --   --  105  BILITOT  --   --   --   0.4  PROT  --   --   --  5.3*  ALBUMIN 2.1* 2.1* 1.9* 1.9*    No results for input(s): LIPASE, AMYLASE in the last 168 hours. No results for input(s): AMMONIA in the last 168 hours. Coagulation Profile: No results for input(s): INR, PROTIME in the last 168 hours. Cardiac Enzymes: No results for input(s): CKTOTAL, CKMB, CKMBINDEX, TROPONINI in the last 168 hours. BNP (last 3 results) No results for input(s): PROBNP in the last 8760  hours. HbA1C: No results for input(s): HGBA1C in the last 72 hours. CBG: No results for input(s): GLUCAP in the last 168 hours. Lipid Profile: No results for input(s): CHOL, HDL, LDLCALC, TRIG, CHOLHDL, LDLDIRECT in the last 72 hours. Thyroid Function Tests: No results for input(s): TSH, T4TOTAL, FREET4, T3FREE, THYROIDAB in the last 72 hours. Anemia Panel: No results for input(s): VITAMINB12, FOLATE, FERRITIN, TIBC, IRON, RETICCTPCT in the last 72 hours. Sepsis Labs: No results for input(s): PROCALCITON, LATICACIDVEN in the last 168 hours.  Recent Results (from the past 240 hour(s))  Blood culture (routine single)     Status: None   Collection Time: 09/01/21  2:15 PM   Specimen: BLOOD  Result Value Ref Range Status   Specimen Description   Final    BLOOD LEFT ANTECUBITAL Performed at [facility] [address], [place], [place] [address]    Special Requests   Final    BOTTLES DRAWN AEROBIC AND ANAEROBIC Blood Culture adequate volume Performed at [facility] [address] [place], [place] [address]    Culture   Final    NO GROWTH 5 DAYS Performed at [facility], [place] [address], [place], [place] [address]    Report Status 09/06/2021 FINAL  Final  Resp Panel by RT-PCR (Flu A&B, Covid)     Status: None   Collection Time: 09/01/21  2:20 PM   Specimen: Nasopharyngeal(NP) swabs in vial transport medium  Result Value Ref Range Status   SARS Coronavirus 2 by RT PCR NEGATIVE NEGATIVE Final     Comment: (NOTE) SARS-CoV-2 target nucleic acids are NOT DETECTED.  The SARS-CoV-2 RNA is generally detectable in upper respiratory specimens during the acute phase of infection. The lowest concentration of SARS-CoV-2 viral copies this assay can detect is 138 copies/mL. A negative result does not preclude SARS-Cov-2 infection and should not be used as the sole basis for treatment or other patient management decisions. A negative result may occur with  improper specimen collection/handling, submission of specimen other than nasopharyngeal swab, presence of viral mutation(s) within the areas targeted by this assay, and inadequate number of viral copies(<138 copies/mL). A negative result must be combined with clinical observations, patient history, and epidemiological information. The expected result is Negative.  Fact Sheet for Patients:  EntrepreneurPulse.com.au  Fact Sheet for Healthcare Providers:  IncredibleEmployment.be  This test is no t yet approved or cleared by the [place] FDA and  has been authorized for detection and/or diagnosis of SARS-CoV-2 by FDA under an Emergency Use Authorization (EUA). This EUA will remain  in effect (meaning this test can be used) for the duration of the COVID-19 declaration under Section 564(b)(1) of the Act, 21 U.S.C.section 360bbb-3(b)(1), unless the authorization is terminated  or revoked sooner.       Influenza A by PCR NEGATIVE NEGATIVE Final   Influenza B by PCR NEGATIVE NEGATIVE Final    Comment: (NOTE) The Xpert Xpress SARS-CoV-2/FLU/RSV plus assay is intended as an aid in the diagnosis of influenza from Nasopharyngeal swab specimens and should not be used as a sole basis for treatment. Nasal washings and aspirates are unacceptable for Xpert Xpress SARS-CoV-2/FLU/RSV testing.  Fact Sheet for Patients: EntrepreneurPulse.com.au  Fact Sheet for Healthcare  Providers: IncredibleEmployment.be  This test is not yet approved or cleared by the [place] FDA and has been authorized for detection and/or diagnosis of SARS-CoV-2 by FDA under an Emergency Use Authorization (EUA). This EUA will remain in effect (meaning this test  can be used) for the duration of the COVID-19 declaration under Section 564(b)(1) of the Act, 21 U.S.C. section 360bbb-3(b)(1), unless the authorization is terminated or revoked.  Performed at [facility], [place] [address], [place], [place] [address]   Urine Culture     Status: Abnormal   Collection Time: 09/01/21  8:00 PM   Specimen: In/Out Cath Urine  Result Value Ref Range Status   Specimen Description   Final    IN/OUT CATH URINE Performed at [facility] [address] [place], [place] [address]    Special Requests   Final    NONE Performed at [facility], [place] [address], [place], [place] [address]    Culture MULTIPLE SPECIES PRESENT, SUGGEST RECOLLECTION (A)  Final   Report Status 09/02/2021 FINAL  Final  MRSA Next Gen by PCR, Nasal     Status: None   Collection Time: 09/02/21 11:34 PM   Specimen: Nasal Mucosa; Nasal Swab  Result Value Ref Range Status   MRSA by PCR Next Gen NOT DETECTED NOT DETECTED Final    Comment: (NOTE) The GeneXpert MRSA Assay (FDA approved for NASAL specimens only), is one component of a comprehensive MRSA colonization surveillance program. It is not intended to diagnose MRSA infection nor to guide or monitor treatment for MRSA infections. Test performance is not FDA approved in patients less than 26 years old. Performed at [facility], [place] [address] [place], [place] [address]   Expectorated Sputum Assessment w Gram Stain, Rflx to Resp Cult     Status: None   Collection Time: 09/05/21 12:13 PM   Specimen: Sputum  Result Value Ref Range Status   Specimen Description SPU  Final    Special Requests NONE  Final   Sputum evaluation   Final    THIS SPECIMEN IS ACCEPTABLE FOR SPUTUM CULTURE Performed at [facility], [place] [address], [place], [place] [address]    Report Status 09/05/2021 FINAL  Final  Culture, Respiratory w Gram Stain     Status: None   Collection Time: 09/05/21 12:13 PM   Specimen: Sputum  Result Value Ref Range Status   Specimen Description   Final    SPU Performed at [facility] [address] [place], [place] [address]    Special Requests   Final    NONE Reflexed from [id number] Performed at [facility], [place] [address] [place], [place] [address]    Gram Stain   Final    ABUNDANT WBC PRESENT,BOTH PMN AND MONONUCLEAR FEW SQUAMOUS EPITHELIAL CELLS PRESENT NO ORGANISMS SEEN    Culture   Final    RARE Normal respiratory flora-no Staph aureus or Pseudomonas seen Performed at [facility], [address] [place], [place] [address]    Report Status 09/08/2021 FINAL  Final      Radiology Studies: No results found.  Scheduled Meds:  (feeding supplement) PROSource Plus  30 mL Oral BID BM   acetaminophen  500 mg Oral TID   amiodarone  200 mg Oral Daily   arformoterol  15 mcg Nebulization BID   bisacodyl  10 mg Rectal Daily   budesonide (PULMICORT) nebulizer solution  0.5 mg Nebulization BID   busPIRone  10 mg Oral Daily   Chlorhexidine Gluconate Cloth  6 each Topical Daily   clopidogrel  75 mg Oral Daily   feeding supplement  237 mL Oral BID BM   fluticasone  2 spray  Each Nare Daily   guaiFENesin  1,200 mg Oral BID   loratadine  10 mg Oral Daily   mouth rinse  15 mL Mouth Rinse BID   midodrine  5 mg Oral TID WC   multivitamin with minerals  1 tablet Oral Daily   pantoprazole  40 mg Oral Q0600   polyethylene glycol  17 g Oral BID   Rivaroxaban  15 mg Oral Q breakfast   Continuous Infusions:  sodium chloride Stopped (09/08/21 1414)     LOS: 9 days   Marylu Lund,  MD [facility] Pager On Amion  If 7PM-7AM, please contact night-coverage 09/11/2021, 1:31 PM

## 2021-09-12 ENCOUNTER — Other Ambulatory Visit: Payer: Self-pay | Admitting: Student

## 2021-09-12 DIAGNOSIS — J449 Chronic obstructive pulmonary disease, unspecified: Secondary | ICD-10-CM | POA: Diagnosis not present

## 2021-09-12 DIAGNOSIS — Z7189 Other specified counseling: Secondary | ICD-10-CM | POA: Diagnosis not present

## 2021-09-12 DIAGNOSIS — A419 Sepsis, unspecified organism: Secondary | ICD-10-CM | POA: Diagnosis not present

## 2021-09-12 DIAGNOSIS — J9611 Chronic respiratory failure with hypoxia: Secondary | ICD-10-CM | POA: Diagnosis not present

## 2021-09-12 DIAGNOSIS — Z515 Encounter for palliative care: Secondary | ICD-10-CM

## 2021-09-12 DIAGNOSIS — I480 Paroxysmal atrial fibrillation: Secondary | ICD-10-CM | POA: Diagnosis not present

## 2021-09-12 LAB — COMPREHENSIVE METABOLIC PANEL
ALT: 10 U/L (ref 0–44)
AST: 13 U/L — ABNORMAL LOW (ref 15–41)
Albumin: 1.9 g/dL — ABNORMAL LOW (ref 3.5–5.0)
Alkaline Phosphatase: 100 U/L (ref 38–126)
Anion gap: 6 (ref 5–15)
BUN: 16 mg/dL (ref 8–23)
CO2: 33 mmol/L — ABNORMAL HIGH (ref 22–32)
Calcium: 8.2 mg/dL — ABNORMAL LOW (ref 8.9–10.3)
Chloride: 92 mmol/L — ABNORMAL LOW (ref 98–111)
Creatinine, Ser: 0.64 mg/dL (ref 0.44–1.00)
GFR, Estimated: >60 mL/min (ref >60)
Glucose, Bld: 83 mg/dL (ref 70–99)
Potassium: 4.9 mmol/L (ref 3.5–5.1)
Sodium: 131 mmol/L — ABNORMAL LOW (ref 135–145)
Total Bilirubin: 0.3 mg/dL (ref 0.3–1.2)
Total Protein: 5.6 g/dL — ABNORMAL LOW (ref 6.5–8.1)

## 2021-09-12 LAB — MAGNESIUM: Magnesium: 2 mg/dL (ref 1.7–2.4)

## 2021-09-12 LAB — OSMOLALITY, URINE: Osmolality, Ur: 274 mOsm/kg — ABNORMAL LOW (ref 300–900)

## 2021-09-12 LAB — SODIUM, URINE, RANDOM: Sodium, Ur: 75 mmol/L

## 2021-09-12 NOTE — Progress Notes (Signed)
Daily Progress Note   Patient Name: Kristen [name]       Date: 09/12/2021 DOB: [birth year]  Age: 75 y.o. MRN#: [id number] Attending Physician: Donne Hazel, MD Primary Care Physician: [name], MD Admit Date: 09/01/2021  Reason for Consultation/Follow-up: Establishing goals of care  Patient Profile/HPI:  75 y.o. female  with past medical history of COPD on home O2 3L, lower extremity cellulitis, chronic stasis ulcers, a fib, aspiration pneumonia admitted on 09/01/2021 with sepsis related to pnueumonia, SLP consulted no overt signs of aspiration, additional findings of volume overload related to acute CHF exacerbation (EF 50%) as well as pulmonary hypertension, has required bipap prn- currently off, was on IV pressors- weaned off. Having some continued asymptomatic hypotension- started on midodrine. Palliative consulted for [place].   Subjective: Chart reviewed including personal review of pertinent labs and imaging  I saw and examined Kristen [name] today.  She was sitting in bedside chair eating dinner at time of my encounter.  She is awake, alert, and very conversational.  We discussed her clinical course with continued hypotension and higher than baseline oxygen requirement.  She tells me that she is making improvements and she is set on a course to continue with what we are doing with hope of returning to her home.  Discussed plan to continue to see how things go over the next couple of days as she discussed earlier today with TOC team.  Physical Exam Vitals and nursing note reviewed.  Cardiovascular:     Rate and Rhythm: Normal rate and regular rhythm.  Pulmonary:     Effort: Pulmonary effort is normal.  Neurological:     Mental Status: She is alert and oriented to person, place, and  time.            Vital Signs: BP 102/60 (BP Location: Left Arm)   Pulse 76   Temp 99.5 F (37.5 C) (Oral)   Resp 20   Ht _0  (1.803 m)   Wt 46.3 kg   SpO2 98%   BMI 14.24 kg/m  SpO2: SpO2: 98 % O2 Device: O2 Device: Nasal Cannula O2 Flow Rate: O2 Flow Rate (L/min): 8 L/min  Intake/output summary:  Intake/Output Summary (Last 24 hours) at 09/12/2021 1945 Last data filed at 09/12/2021 1700 Gross per 24 hour  Intake 860 ml  Output 350 ml  Net 510 ml    LBM: Last BM Date: 09/10/21 Baseline Weight: Weight: 41.9 kg Most recent weight: Weight: 46.3 kg       Palliative Assessment/Data: PPS: 50%      Patient Active Problem List   Diagnosis Date Noted  . Elevated troponin   . Sepsis ([place]) 09/03/2021  . Protein-calorie malnutrition, severe 09/03/2021  . Community acquired pneumonia 09/01/2021  . Diarrhea 09/01/2021  . Chronic respiratory failure with hypoxia ([place]) 09/01/2021  . Hypotension   . Pressure injury of skin 06/08/2021  . CAP (community acquired pneumonia) 05/29/2021  . Aspiration pneumonia ([place]) 05/29/2021  . Atrial fibrillation with RVR ([place]) 05/27/2021  . Cellulitis of left lower extremity 05/06/2021  . Aortic atherosclerosis ([place]) 03/07/2021  . Opiate use 03/05/2021  . Need for assessment by dentistry for poor dentition 03/05/2021  . Abdominal pain 02/06/2021  . Health care maintenance 12/27/2020  . AF (paroxysmal atrial fibrillation) ([place]) 12/16/2020  . Nausea 11/07/2020  . Lower extremity ulceration ([place]) 10/04/2020  . Generalized anxiety disorder 08/27/2020  . 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 Smoking 07/18/2020  . Venous insufficiency of both lower extremities 07/18/2020  . Acute respiratory failure with hypoxia ([place]) 07/01/2020  . COPD (chronic obstructive pulmonary disease) ([place]) 06/28/2020  . Protein-calorie malnutrition ([place]) 06/28/2020  . Hyponatremia 06/28/2020    Palliative Care Assessment & Plan     Assessment/Recommendations/Plan  No CPR.  No intubation. Continue current care.  Kristen [name] is set on a plan to return home at time of discharge.  She reports that she understands she has too many challenges to do that right at this moment (weakness, hypotension, and oxygen requirement) but she feels she is making strides daily toward this goal. Palliative to continue to follow peripherally.  She has my card and contact information will call if she has any concerns or questions.  Prognosis:  Unable to determine  Discharge Planning: Home with Palliative Services and [place] was discussed with patient.   Thank you for allowing the Palliative Medicine Team to assist in the care of this patient.  Total time:  30 Prolonged billing: No  Greater than 50%  of this time was spent counseling and coordinating care related to the above assessment and plan.  Micheline Rough, MD [place] Team [phone]   Please contact Palliative Medicine Team phone at [phone] for questions and concerns.

## 2021-09-12 NOTE — Care Management Important Message (Signed)
Important Message  Patient Details IM Letter given to the Patient. Name: Kristen [name] MRN: [id number] Date of Birth: [birth year]   Medicare Important Message Given:  Yes     Kerin Salen 09/12/2021, 10:48 AM

## 2021-09-12 NOTE — TOC Initial Note (Signed)
Transition of Care [facility]) - Initial/Assessment Note    Patient Details  Name: Kristen [name] MRN: [id number] Date of Birth: 1946-05-26  Transition [facility]) CM/SW Contact:    Trish Mage, LCSW Phone Number: 09/12/2021, 2:34 PM  Clinical Narrative:   Patient seen to talk about plan for d/c.  Ms Offer admits that if she was told she was stable for d/c today, she would have to go to SNF.  However, she also knows that she needs to get weaned down on her O2, says she is at 3 L at home at baseline, and is hopeful with more time and work she will be well enough to go home.  She and her sister have lived together for the past year, and as long as she is well enough for one person supervision/assist, she will be good to go with [facility] services.   If not, she is open to returning to SNF for help.  She knows the drill as she was at [facility] this summer.  She has all needed DME.  Will check back on Monday to determine progress or lack thereof. TOC will continue to follow during the course of hospitalization.             Expected Discharge Plan: [place] Barriers to Discharge: Continued Medical Work up   Patient Goals and CMS Choice Patient states their goals for this hospitalization and ongoing recovery are:: to go home CMS Medicare.gov Compare Post Acute Care list provided to:: Patient Choice offered to / list presented to : Patient  Expected Discharge Plan and Services Expected Discharge Plan: [place]   Discharge Planning Services: CM Consult Post Acute Care Choice: [place] arrangements for the past 2 months: Single Family Home                                      Prior Living Arrangements/Services Living arrangements for the past 2 months: Single Family Home Lives with:: Siblings Patient language and need for interpreter reviewed:: Yes Do you feel safe going back to the place where you live?: Yes      Need for Family  Participation in Patient Care: Yes (Comment) Care giver support system in place?: Yes (comment)   Criminal Activity/Legal Involvement Pertinent to Current Situation/Hospitalization: No - Comment as needed  Activities of Daily Living Home Assistive Devices/Equipment: Eyeglasses, Oxygen, Nebulizer (reading glasses) ADL Screening (condition at time of admission) Patient's cognitive ability adequate to safely complete daily activities?: Yes Is the patient deaf or have difficulty hearing?: No Does the patient have difficulty seeing, even when wearing glasses/contacts?: No Does the patient have difficulty concentrating, remembering, or making decisions?: No Patient able to express need for assistance with ADLs?: Yes Does the patient have difficulty dressing or bathing?: No Independently performs ADLs?: Yes (appropriate for developmental age) Does the patient have difficulty walking or climbing stairs?: Yes Weakness of Legs: Both Weakness of Arms/Hands: Both  Permission Sought/Granted Permission sought to share information with : Family Supports Permission granted to share information with : Yes, Verbal Permission Granted  Share Information with NAME: Doss,Cynthia (Sister)   [phone]           Emotional Assessment Appearance:: Appears stated age Attitude/Demeanor/Rapport: Engaged Affect (typically observed): Appropriate Orientation: : Oriented to Self, Oriented to Place, Oriented to  Time, Oriented to Situation Alcohol / Substance Use: Not Applicable  Psych Involvement: No (comment)  Admission diagnosis:  Aspiration pneumonia ([place]) [J69.0] Community acquired pneumonia [J18.9] Community acquired pneumonia, unspecified laterality [J18.9] Patient Active Problem List   Diagnosis Date Noted   Elevated troponin    Sepsis ([place]) 09/03/2021   Protein-calorie malnutrition, severe 09/03/2021   Community acquired pneumonia 09/01/2021   Diarrhea 09/01/2021   Chronic respiratory failure  with hypoxia ([place]) 09/01/2021   Hypotension    Pressure injury of skin 06/08/2021   CAP (community acquired pneumonia) 05/29/2021   Aspiration pneumonia ([place]) 05/29/2021   Atrial fibrillation with RVR ([place]) 05/27/2021   Cellulitis of left lower extremity 05/06/2021   Aortic atherosclerosis ([place]) 03/07/2021   Opiate use 03/05/2021   Need for assessment by dentistry for poor dentition 03/05/2021   Abdominal pain 02/06/2021   Health care maintenance 12/27/2020   AF (paroxysmal atrial fibrillation) ([place]) 12/16/2020   Nausea 11/07/2020   Lower extremity ulceration ([place]) 10/04/2020   Generalized anxiety disorder 08/27/2020   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[place])    Smoking 07/18/2020   Venous insufficiency of both lower extremities 07/18/2020   Acute respiratory failure with hypoxia ([place]) 07/01/2020   COPD (chronic obstructive pulmonary disease) ([place]) 06/28/2020   Protein-calorie malnutrition ([place]) 06/28/2020   Hyponatremia 06/28/2020   PCP:  [name], MD Pharmacy:   [id number] [place] [address]Phone: [phone]: [phone]    Social Determinants of Health (SDOH) Interventions    Readmission Risk Interventions Readmission Risk Prevention Plan 06/17/2021 06/02/2021 09/29/2020  Transportation Screening Complete Complete Complete  PCP or Specialist Appt within 5-7 Days - - Complete  Home Care Screening - - Complete  Medication Review (RN CM) - - Complete  Medication Review ([place] - Complete -  PCP or Specialist appointment within 3-5 days of discharge - Complete -  [place]- Complete -  SW Recovery Care/Counseling Consult Complete Complete -  Palliative Care Screening Complete Complete -  Skilled Nursing Facility Complete Complete -  Some recent data might be hidden

## 2021-09-12 NOTE — Progress Notes (Signed)
PROGRESS NOTE    Kristen [name]  FYB:[id number] DOB: 09-20-1946 DOA: 09/01/2021 PCP: [name], MD    Brief Narrative:  Kristen [name] is a 75 y.o. female with medical history significant of COPD with chronic hypoxic respiratory failure on 3 L/min oxygen at baseline, chronic A-fib, venous insufficiency, hx of transient hypotension, hospital admission in July of this year for aspiration pneumonia and acute on chronic hypoxic respiratory failure.  She was brought to [facility] ED from home by EMS due to ongoing diarrhea x 5 days, productive cough with associated right-sided chest pain and chills, with progressive generalized weakness.  Denies abdominal pain, nausea, vomiting, dysuria, focal weakness numbness or tingling, headaches or other recent symptoms.  Reports increasing her oxygen briefly to 3.5 L/min mostly due to feeling anxious.  Otherwise has been on her usual 3 L/min. Chest xray showed new extensive consolidation in the lateral right middle and lower lung, consistent with PNA, small right pleural effusion and chronic hyperexpansion compatible with emphysema. Patient is followed by the IM residency service at [facility], but this states she would prefer to be admitted here to [facility] this time. Patient admitted placed empirically on IV antibiotics, respiratory status worsen and IV antibiotics broadened, PCCM consulted and following.  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 -On admission patient noted to have met criteria for sepsis with leukocytosis, tachypnea, pneumonia. -Patient with a rising leukocytosis however started to slowly trend back down with broadening of IV antibiotic coverage..   -Most recent WBC of 14.9 from a peak of 20 -This AM required down to 8L high flow O2, baseline is 3 L nasal cannula. -Blood cultures pending with no growth to date x5 days, MRSA PCR negative.  Urine cultures with multiple species. -Sputum gram stain and culture with no organisms seen. -Clinically still improving, continue to wean O2 as tolerated -Completed 7-day course of Zosyn -Continue Brovana, Pulmicort, scheduled Xopenex and Atrovent nebs, Mucinex, Claritin, Flonase. -Recommend repeat imaging in 6 to 8 weeks for resolution of dense right upper lobe pneumonia per PCCM recommendations. -Therapy recs for HHPT at time of d/c  2.  Hypotension -Patient with systolic blood pressures noted to be low in the 80s on 09/03/2021.   -Random cortisol 16.8 on 09/05/2021. -IV albumin every 6 hours x24 hours given with some improvement with blood pressure however blood pressure was soft on 09/05/2021 and patient became hypotensive requiring Levophed pressor which has been weaned off as of 09/06/2021.   -Patient started on midodrine per PCCM. -Systolic blood pressures overall stable -Diuretics subsequently discontinued -Cont with supportive care.   -PCCM had been following    3.  Acute on chronic hypoxic respiratory failure in the setting of COPD likely  secondary to aspiration pneumonia and probable volume overload/acute diastolic CHF exacerbation/abnormal 2D echo -Likely secondary to  pneumonia/aspiration pneumonia in the setting of possible volume overload. -Had been BiPAP dependent, now on 15 L high flow O2 -Continue to wean O2 as tolerated -BNP obtained at 942.6. -2D echo from 10/23/2020 with a EF of 55 to 23%,JSEG, grade 1 diastolic dysfunction, interventricular septum flattened in diastole consistent with right ventricular volume overload, low normal right ventricular systolic function, right ventricular size normal. -2D echo from 09/05/2021 with EF of 50%, left ventricle with wall motion abnormalities, left ventricular diastolic parameters indeterminate, interventricular septum is flattened in systole consistent with right ventricular pressure overload, mildly reduced right ventricular systolic function, right ventricular size is moderately to severely enlarged, moderately elevated pulmonary artery systolic pressure, right atrial size moderately dilated. -Blood pressure improved post IV albumin however patient remained hypotensive on 09/05/2021 requiring placement on pressors/Levophed.  -Levophed pressors weaned off 09/06/2021 and patient placed on midodrine. -Repeat chest x-ray from 09/04/2021 with increased confluent opacity over the right upper lobe with worsening variation throughout remainder of right lung concerning for worsening pneumonia with a persistent parapneumonic effusion.   -Repeat chest x-ray from 09/05/2021 with multilobular infiltrate and small effusions, most likely pneumonia, mild treatment right upper lobe infiltrate.  -ABG with a pH of 7.394, PCO2 of 44, PO2 of 58.5. -Status post Lasix 20 mg IV x1 on 09/04/2021 with urine output of 800 cc recorded.   -Diuretics now discontinued. -Strict I's and O's.  -BiPAP as needed.  -Continue scheduled Xopenex and Atrovent nebs, Pulmicort, Brovana, Claritin, PPI, Flonase -- Patient completed 7-day course of Zosyn -Cardiology had been following. -PCCM had been following  4.  Elevated troponin/abnormal 2D  echo -Patient with acute on chronic respiratory distress on 09/04/2021, noted to have elevated BNP with concerns for volume overload.  Cardiac enzymes were elevated. -2D echo with EF of 50%, left ventricle with wall motion abnormalities, left ventricular diastolic parameters indeterminate, interventricular septum is flattened in systole consistent with right ventricular pressure overload, mildly reduced right ventricular systolic function, right ventricular size is moderately to severely enlarged, moderately elevated pulmonary artery systolic pressure, right atrial size moderately dilated. -Patient seen in consultation by cardiology this point in time suspect no need for further evaluation given her comorbid illness and frailty. -Patient noted to be hypotensive was on pressors which have been weaned off.  Hold off on starting beta-blocker.  -Cardiology recommending continuation of empiric therapy with Plavix with no plans for invasive evaluation at this time. -Cardiology has signed off and recommending outpatient follow-up with Dr. Johney Frame in the outpatient setting. -Continued with midodrine   5.  Diarrhea>>>.  Constipation -Patient had reported 5 days of diarrhea at home prior to admission. -No bowel movement since admission. -Patient with complaints of constipation. -Received a dose of sorbitol on 09/05/2021 with no results.   -Patient placed on daily Dulcolax suppositories, MiraLAX twice daily with bowel movement noted 09/06/2021, 09/07/2021, 09/08/2021.   -Continue current bowel regimen as needed  6.  Paroxysmal atrial fibrillation -Amiodarone for rate control.   -Xarelto for anticoagulation.   -Cardiology had been following earlier.  7.  Prolonged QTC -Patient noted to have a QTC of 560 on admission, chronically on amiodarone. -Repeat EKG with resolution of QT prolongation currently at 437. -Keep potassium approximately at 4, magnesium approximately at 2.  8.  Chronic  hyponatremia -Sodium up to 131 -Has documented hx of chronic hyponatremia. Clinically stable  9.  Chronic venous stasis of  bilateral lower extremities/venous insufficiency with ulceration -Continue current wound treatment recommendations per Olney RN  10.  Hypomagnesemia/hypokalemia -Replaced  -Recheck lytes in AM  11.  Moderate protein calorie malnutrition -Patient cachectic and frail on examination. -Albumin level at 2.3 on admission. -Diet liberalized.  -Status post IV albumin every 6 hours x1 day.   -Albumin currently at 2.1 (09/07/2021).   -Continue Ensure nutritional supplementation.   -RD was consulted.  12.??  Pulmonary hypertension -2D echo concerning for pulmonary hypertension felt likely in the setting of group 3 disease with chronic respiratory failure/COPD per PCCM.  Patient would benefit from a sleep study in the outpatient setting.  13.  Goals of care -It is noted that palliative care was to follow-up with patient after recent discharge, patient is a DO NOT INTUBATE.  Patient does express desire for no CPR as she feels could break her ribs.  Patient is accepting of ACLS medications if needed.  CODE STATUS was later changed to limited code.   -Palliative care has assessed patient and current recommendations are to continue current plan, recommend outpatient palliative referral to follow-up at home on discharge.    DVT prophylaxis: Xarelto Code Status: Partial Family Communication: Pt in room, family not at bedside  Status is: Inpatient  Remains inpatient appropriate because: severity of illness    Consultants:  WOC Phineas Douglas, RN 09/02/2021 PCCM: Dr.Olalere 09/04/2021 Cardiology: Dr. Percival Spanish 09/04/2021 Palliative care: Mariana Kaufman, NP 09/08/2021  Procedures:    Antimicrobials: Anti-infectives (From admission, onward)    Start     Dose/Rate Route Frequency Ordered Stop   09/04/21 1200  piperacillin-tazobactam (ZOSYN) IVPB 3.375 g  Status:  Discontinued         3.375 g 100 mL/hr over 30 Minutes Intravenous Every 6 hours 09/04/21 0955 09/04/21 0957   09/04/21 1100  piperacillin-tazobactam (ZOSYN) IVPB 3.375 g        3.375 g 12.5 mL/hr over 240 Minutes Intravenous Every 8 hours 09/04/21 0959 09/11/21 0000   09/02/21 1600  ampicillin-sulbactam (UNASYN) 1.5 g in sodium chloride 0.9 % 100 mL IVPB  Status:  Discontinued        1.5 g 200 mL/hr over 30 Minutes Intravenous Every 6 hours 09/02/21 1006 09/04/21 0955   09/02/21 1000  cefTRIAXone (ROCEPHIN) 2 g in sodium chloride 0.9 % 100 mL IVPB  Status:  Discontinued        2 g 200 mL/hr over 30 Minutes Intravenous Every 24 hours 09/01/21 1551 09/01/21 1819   09/02/21 1000  azithromycin (ZITHROMAX) 500 mg in sodium chloride 0.9 % 250 mL IVPB  Status:  Discontinued        500 mg 250 mL/hr over 60 Minutes Intravenous Every 24 hours 09/01/21 1551 09/01/21 1720   09/01/21 2359  Ampicillin-Sulbactam (UNASYN) 3 g in sodium chloride 0.9 % 100 mL IVPB  Status:  Discontinued        3 g 200 mL/hr over 30 Minutes Intravenous Every 8 hours 09/01/21 1823 09/02/21 1006   09/01/21 1615  cefTRIAXone (ROCEPHIN) 1 g in sodium chloride 0.9 % 100 mL IVPB  Status:  Discontinued        1 g 200 mL/hr over 30 Minutes Intravenous  Once 09/01/21 1602 09/01/21 1720   09/01/21 1445  azithromycin (ZITHROMAX) 500 mg in sodium chloride 0.9 % 250 mL IVPB        500 mg 250 mL/hr over 60 Minutes Intravenous Every 24 hours 09/01/21 1440 09/01/21 1606   09/01/21 1445  cefTRIAXone (ROCEPHIN)  1 g in sodium chloride 0.9 % 100 mL IVPB        1 g 200 mL/hr over 30 Minutes Intravenous Every 24 hours 09/01/21 1440 09/01/21 1532       Subjective: Without complaints this AM. Is asking for a warm blanket  Objective: Vitals:   09/12/21 0458 09/12/21 0725 09/12/21 0839 09/12/21 1256  BP: 107/61   102/60  Pulse: 71   76  Resp: 19   20  Temp: 97.6 F (36.4 C)   99.5 F (37.5 C)  TempSrc:    Oral  SpO2:  96% 96% 98%  Weight:       Height:       No intake or output data in the 24 hours ending 09/12/21 1819  Filed Weights   09/02/21 1900 09/04/21 1000 09/10/21 1746  Weight: 41.9 kg 44 kg 46.3 kg    Examination: General exam: Awake, laying in bed, in nad Respiratory system: Normal respiratory effort, no wheezing Cardiovascular system: regular rate, s1, s2 Gastrointestinal system: Soft, nondistended, positive BS Central nervous system: CN2-12 grossly intact, strength intact Extremities: Perfused, no clubbing Skin: Normal skin turgor, no notable skin lesions seen Psychiatry: Mood normal // no visual hallucinations   Data Reviewed: I have personally reviewed following labs and imaging studies  CBC: Recent Labs  Lab 09/06/21 0237 09/07/21 0241 09/08/21 0254 09/09/21 0239 09/10/21 0243 09/11/21 0238  WBC 17.3* 17.2* 18.6* 16.5* 14.9* 14.9*  NEUTROABS 13.3* 13.5* 14.7*  --  11.3*  --   HGB 8.3* 8.9* 8.3* 8.6* 8.0* 8.0*  HCT 25.7* 27.5* 26.0* 26.9* 25.5* 25.7*  MCV 87.7 88.7 88.1 89.7 91.1 91.5  PLT 554* 552* 572* 603* 580* 641*    Basic Metabolic Panel: Recent Labs  Lab 09/06/21 0237 09/07/21 0241 09/08/21 0254 09/09/21 0239 09/10/21 0243 09/11/21 0238 09/12/21 0448  NA 132* 128* 128* 130* 129* 127* 131*  K 4.4 4.0 4.6 4.7 4.5 4.7 4.9  CL 97* 92* 91* 92* 92* 88* 92*  CO2 _0 33* 33*  GLUCOSE 97 124* 81 82 81 90 83  BUN _1 CREATININE 0.61 0.61 0.70 0.59 0.64 0.64 0.64  CALCIUM 8.1* 8.1* 7.9* 8.1* 8.0* 7.8* 8.2*  MG 1.7 1.7 1.9  --  1.5*  --  2.0  PHOS 2.5 2.7  --   --  3.6  --   --     GFR: Estimated Creatinine Clearance: 43.7 mL/min (by C-G formula based on SCr of 0.64 mg/dL). Liver Function Tests: Recent Labs  Lab 09/06/21 0237 09/07/21 0241 09/10/21 0243 09/11/21 0238 09/12/21 0448  AST  --   --   --  11* 13*  ALT  --   --   --  9 10  ALKPHOS  --   --   --  105 100  BILITOT  --   --   --  0.4 0.3  PROT  --   --   --  5.3* 5.6*  ALBUMIN 2.1*  2.1* 1.9* 1.9* 1.9*    No results for input(s): LIPASE, AMYLASE in the last 168 hours. No results for input(s): AMMONIA in the last 168 hours. Coagulation Profile: No results for input(s): INR, PROTIME in the last 168 hours. Cardiac Enzymes: No results for input(s): CKTOTAL, CKMB, CKMBINDEX, TROPONINI in the last 168 hours. BNP (last 3 results) No results for input(s): PROBNP in the last 8760 hours. HbA1C: No results for input(s): HGBA1C in the  last 72 hours. CBG: No results for input(s): GLUCAP in the last 168 hours. Lipid Profile: No results for input(s): CHOL, HDL, LDLCALC, TRIG, CHOLHDL, LDLDIRECT in the last 72 hours. Thyroid Function Tests: No results for input(s): TSH, T4TOTAL, FREET4, T3FREE, THYROIDAB in the last 72 hours. Anemia Panel: No results for input(s): VITAMINB12, FOLATE, FERRITIN, TIBC, IRON, RETICCTPCT in the last 72 hours. Sepsis Labs: No results for input(s): PROCALCITON, LATICACIDVEN in the last 168 hours.  Recent Results (from the past 240 hour(s))  MRSA Next Gen by PCR, Nasal     Status: None   Collection Time: 09/02/21 11:34 PM   Specimen: Nasal Mucosa; Nasal Swab  Result Value Ref Range Status   MRSA by PCR Next Gen NOT DETECTED NOT DETECTED Final    Comment: (NOTE) The GeneXpert MRSA Assay (FDA approved for NASAL specimens only), is one component of a comprehensive MRSA colonization surveillance program. It is not intended to diagnose MRSA infection nor to guide or monitor treatment for MRSA infections. Test performance is not FDA approved in patients less than 18 years old. Performed at [facility], [place] [address] [place], [place] [address]   Expectorated Sputum Assessment w Gram Stain, Rflx to Resp Cult     Status: None   Collection Time: 09/05/21 12:13 PM   Specimen: Sputum  Result Value Ref Range Status   Specimen Description SPU  Final   Special Requests NONE  Final   Sputum evaluation   Final    THIS SPECIMEN IS  ACCEPTABLE FOR SPUTUM CULTURE Performed at [facility], [place] [address], [place], [place] [address]    Report Status 09/05/2021 FINAL  Final  Culture, Respiratory w Gram Stain     Status: None   Collection Time: 09/05/21 12:13 PM   Specimen: Sputum  Result Value Ref Range Status   Specimen Description   Final    SPU Performed at [place] [address], [place], [place] [address]    Special Requests   Final    NONE Reflexed from [id number] Performed at [facility], [place] [address] [place], [place] [address]    Gram Stain   Final    ABUNDANT WBC PRESENT,BOTH PMN AND MONONUCLEAR FEW SQUAMOUS EPITHELIAL CELLS PRESENT NO ORGANISMS SEEN    Culture   Final    RARE Normal respiratory flora-no Staph aureus or Pseudomonas seen Performed at [facility], [address] [place], [place] [address]    Report Status 09/08/2021 FINAL  Final      Radiology Studies: No results found.  Scheduled Meds:  (feeding supplement) PROSource Plus  30 mL Oral BID BM   acetaminophen  500 mg Oral TID   amiodarone  200 mg Oral Daily   arformoterol  15 mcg Nebulization BID   bisacodyl  10 mg Rectal Daily   budesonide (PULMICORT) nebulizer solution  0.5 mg Nebulization BID   busPIRone  10 mg Oral Daily   clopidogrel  75 mg Oral Daily   feeding supplement  237 mL Oral BID BM   fluticasone  2 spray Each Nare Daily   guaiFENesin  1,200 mg Oral BID   loratadine  10 mg Oral Daily   mouth rinse  15 mL Mouth Rinse BID   midodrine  5 mg Oral TID WC   multivitamin with minerals  1 tablet Oral Daily   pantoprazole  40 mg Oral Q0600   polyethylene glycol  17 g Oral BID  Rivaroxaban  15 mg Oral Q breakfast   Continuous Infusions:  sodium chloride Stopped (09/08/21 1414)     LOS: 10 days   Marylu Lund, MD [facility] Pager On Amion  If 7PM-7AM, please contact night-coverage 09/12/2021, 6:19 PM

## 2021-09-12 NOTE — Progress Notes (Signed)
Physical Therapy Treatment Patient Details Name: Kristen [name] MRN: [id number] DOB: 03-Jul-1946 Today's Date: 09/12/2021   History of Present Illness Pt is 75 yo female who presented to [facility] ED from home by EMS due to ongoing diarrhea x 5 days, productive cough with associated right-sided chest pain and chills, with progressive generalized weakness. Medical history significant of COPD with chronic hypoxic respiratory failure on 3 L/min oxygen at baseline, chronic A-fib, venous insufficiency, hx of transient hypotension, hospital admission in July of this year for aspiration pneumonia and acute on chronic hypoxic respiratory failure.    PT Comments    Patient on 8 L HFNC, SPO2 95% . Patient requires extra time to  intiate mobility, talkative.  Patient required min assistance to sit up and mod + 2 arm hold to stand and pivot to recliner.Depending on progress and  respiratory status, patient may benefit from SNF but most likely will decline. Continue PT.   Recommendations for follow up therapy are one component of a multi-disciplinary discharge planning process, led by the attending physician.  Recommendations may be updated based on patient status, additional functional criteria and insurance authorization.  Follow Up Recommendations  Home health PT     Equipment Recommendations  None recommended by PT    Recommendations for Other Services       Precautions / Restrictions Precautions Precautions: Fall Precaution Comments: monitor O2 and HR, 8 L HFNC     Mobility  Bed Mobility   Bed Mobility: Supine to Sit Rolling: Mod assist         General bed mobility comments: increased time to transfer to side of bed.    Transfers Overall transfer level: Needs assistance Equipment used: 2 person hand held assist Transfers: Sit to/from Stand Sit to Stand: Max assist;Mod assist Stand pivot transfers: Mod assist Squat pivot transfers: Mod assist     General transfer comment: mod assist  to stna d quickly and step to recliner.  Ambulation/Gait                 Stairs             Wheelchair Mobility    Modified Rankin (Stroke Patients Only)       Balance Overall balance assessment: Needs assistance Sitting-balance support: Bilateral upper extremity supported;Feet supported Sitting balance-Leahy Scale: Good     Standing balance support: Bilateral upper extremity supported Standing balance-Leahy Scale: Poor Standing balance comment: reliant on UEs                            Cognition Arousal/Alertness: Awake/alert Behavior During Therapy: WFL for tasks assessed/performed Overall Cognitive Status: Within Functional Limits for tasks assessed                                 General Comments: 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, lacks insight into severity of respiratory status and ability to return home      Exercises      General Comments        Pertinent Vitals/Pain Faces Pain Scale: Hurts little more Pain Location: Chronic Back Pain Descriptors / Indicators: Discomfort;Aching Pain Intervention(s): Monitored during session    Home Living                      Prior Function            PT  Goals (current goals can now be found in the care plan section) Progress towards PT goals: Progressing toward goals    Frequency    Min 3X/week      PT Plan Current plan remains appropriate    Co-evaluation              AM-PAC PT "6 Clicks" Mobility   Outcome Measure  Help needed turning from your back to your side while in a flat bed without using bedrails?: A Little Help needed moving from lying on your back to sitting on the side of a flat bed without using bedrails?: A Little Help needed moving to and from a bed to a chair (including a wheelchair)?: A Lot Help needed standing up from a chair using your arms (e.g., wheelchair or bedside chair)?: A Lot Help needed to walk in hospital  room?: Total Help needed climbing 3-5 steps with a railing? : Total 6 Click Score: 12    End of Session Equipment Utilized During Treatment: Oxygen Activity Tolerance: Patient limited by fatigue Patient left: in chair;with chair alarm set;with call bell/phone within reach Nurse Communication: Mobility status PT Visit Diagnosis: Muscle weakness (generalized) (M62.81)     Time: 7672-0947 PT Time Calculation (min) (ACUTE ONLY): 17 min  Charges:  $Therapeutic Activity: 8-22 mins                     [place] Pager [phone] Office [phone]    Claretha Cooper 09/12/2021, 5:14 PM

## 2021-09-13 DIAGNOSIS — J9611 Chronic respiratory failure with hypoxia: Secondary | ICD-10-CM | POA: Diagnosis not present

## 2021-09-13 DIAGNOSIS — J9601 Acute respiratory failure with hypoxia: Secondary | ICD-10-CM | POA: Diagnosis not present

## 2021-09-13 LAB — COMPREHENSIVE METABOLIC PANEL
ALT: 11 U/L (ref 0–44)
AST: 17 U/L (ref 15–41)
Albumin: 2.2 g/dL — ABNORMAL LOW (ref 3.5–5.0)
Alkaline Phosphatase: 117 U/L (ref 38–126)
Anion gap: 6 (ref 5–15)
BUN: 14 mg/dL (ref 8–23)
CO2: 32 mmol/L (ref 22–32)
Calcium: 8.6 mg/dL — ABNORMAL LOW (ref 8.9–10.3)
Chloride: 92 mmol/L — ABNORMAL LOW (ref 98–111)
Creatinine, Ser: 0.58 mg/dL (ref 0.44–1.00)
GFR, Estimated: >60 mL/min (ref >60)
Glucose, Bld: 81 mg/dL (ref 70–99)
Potassium: 5.1 mmol/L (ref 3.5–5.1)
Sodium: 130 mmol/L — ABNORMAL LOW (ref 135–145)
Total Bilirubin: 0.4 mg/dL (ref 0.3–1.2)
Total Protein: 6.2 g/dL — ABNORMAL LOW (ref 6.5–8.1)

## 2021-09-13 MED ORDER — POLYVINYL ALCOHOL 1.4 % OP SOLN
1.0000 [drp] | OPHTHALMIC | Status: DC | PRN
  Filled 2021-09-13: qty 15

## 2021-09-13 NOTE — Progress Notes (Signed)
PROGRESS NOTE    Kristen [name]  DUK:[id number] DOB: 1946/01/22 DOA: 09/01/2021 PCP: [name], MD    Brief Narrative:  Kristen [name] is a 75 y.o. female with medical history significant of COPD with chronic hypoxic respiratory failure on 3 L/min oxygen at baseline, chronic A-fib, venous insufficiency, hx of transient hypotension, hospital admission in July of this year for aspiration pneumonia and acute on chronic hypoxic respiratory failure.  She was brought to [facility] ED from home by EMS due to ongoing diarrhea x 5 days, productive cough with associated right-sided chest pain and chills, with progressive generalized weakness.  Denies abdominal pain, nausea, vomiting, dysuria, focal weakness numbness or tingling, headaches or other recent symptoms.  Reports increasing her oxygen briefly to 3.5 L/min mostly due to feeling anxious.  Otherwise has been on her usual 3 L/min. Chest xray showed new extensive consolidation in the lateral right middle and lower lung, consistent with PNA, small right pleural effusion and chronic hyperexpansion compatible with emphysema. Patient is followed by the IM residency service at [facility], but this states she would prefer to be admitted here to [facility] this time. Patient admitted placed empirically on IV antibiotics, respiratory status worsen and IV antibiotics broadened, PCCM consulted and following.  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 -On admission patient noted to have met criteria for sepsis with leukocytosis, tachypnea, pneumonia. -Patient with a rising leukocytosis however started to slowly trend back down with broadening of IV antibiotic coverage..   -Most recent WBC of 14.9 from a peak of 20 -Pt remains on 8L high flow O2, baseline is 3 L nasal cannula. -Blood cultures pending with no growth to date x5 days, MRSA PCR negative.  Urine cultures with multiple species. -Sputum gram stain and culture with no organisms seen. -Clinically still improving, continue to wean O2 as tolerated -Completed 7-day course of Zosyn -Continue Brovana, Pulmicort, scheduled Xopenex and Atrovent nebs, Mucinex, Claritin, Flonase. -Recommend repeat imaging in 6 to 8 weeks for resolution of dense right upper lobe pneumonia per PCCM recommendations. -Therapy recs for HHPT at time of d/c -Appreciate Palliative Care input  2.  Hypotension -Patient with systolic blood pressures noted to be low in the 80s on 09/03/2021.   -Random cortisol 16.8 on 09/05/2021. -IV albumin every 6 hours x24 hours given with some improvement with blood pressure however blood pressure was soft on 09/05/2021 and patient became hypotensive requiring Levophed pressor which has been weaned off as of 09/06/2021.   -Patient started on midodrine per PCCM. -Systolic blood pressures overall stable -Diuretics subsequently discontinued -Cont with supportive care.   -PCCM had been following  3.  Acute on chronic hypoxic respiratory failure in the setting of COPD likely  secondary to aspiration pneumonia and probable volume overload/acute diastolic CHF exacerbation/abnormal 2D echo -Likely  secondary to pneumonia/aspiration pneumonia in the setting of possible volume overload. -Had been BiPAP dependent, now on 15 L high flow O2 -Continue to wean O2 as tolerated -BNP obtained at 942.6. -2D echo from 10/23/2020 with a EF of 55 to 65%,KCLE, grade 1 diastolic dysfunction, interventricular septum flattened in diastole consistent with right ventricular volume overload, low normal right ventricular systolic function, right ventricular size normal. -2D echo from 09/05/2021 with EF of 50%, left ventricle with wall motion abnormalities, left ventricular diastolic parameters indeterminate, interventricular septum is flattened in systole consistent with right ventricular pressure overload, mildly reduced right ventricular systolic function, right ventricular size is moderately to severely enlarged, moderately elevated pulmonary artery systolic pressure, right atrial size moderately dilated. -Blood pressure improved post IV albumin however patient remained hypotensive on 09/05/2021 requiring placement on pressors/Levophed.  -Levophed pressors weaned off 09/06/2021 and patient placed on midodrine. -Repeat chest x-ray from 09/04/2021 with increased confluent opacity over the right upper lobe with worsening variation throughout remainder of right lung concerning for worsening pneumonia with a persistent parapneumonic effusion.   -Repeat chest x-ray from 09/05/2021 with multilobular infiltrate and small effusions, most likely pneumonia, mild treatment right upper lobe infiltrate.  -ABG with a pH of 7.394, PCO2 of 44, PO2 of 58.5. -Status post Lasix 20 mg IV x1 on 09/04/2021 with urine output of 800 cc recorded.   -Diuretics now discontinued. -Strict I's and O's.  -BiPAP as needed.  -Continue scheduled Xopenex and Atrovent nebs, Pulmicort, Brovana, Claritin, PPI, Flonase -- Patient completed 7-day course of Zosyn, abx now d/c'd  4.  Elevated troponin/abnormal 2D echo -Patient with acute on chronic  respiratory distress on 09/04/2021, noted to have elevated BNP with concerns for volume overload.  Cardiac enzymes were elevated. -2D echo with EF of 50%, left ventricle with wall motion abnormalities, left ventricular diastolic parameters indeterminate, interventricular septum is flattened in systole consistent with right ventricular pressure overload, mildly reduced right ventricular systolic function, right ventricular size is moderately to severely enlarged, moderately elevated pulmonary artery systolic pressure, right atrial size moderately dilated. -Patient seen in consultation by cardiology this point in time suspect no need for further evaluation given her comorbid illness and frailty. -Patient noted to be hypotensive was on pressors which have been weaned off.  Hold off on starting beta-blocker.  -Cardiology recommending continuation of empiric therapy with Plavix with no plans for invasive evaluation at this time. -Cardiology has signed off and recommending outpatient follow-up with Dr. Johney Frame in the outpatient setting. -Continued with midodrine as tolerated  5.  Diarrhea>>>.  Constipation -Patient had reported 5 days of diarrhea at home prior to admission. -No bowel movement since admission. -Patient with complaints of constipation. -Continue current bowel regimen as needed  6.  Paroxysmal atrial fibrillation -Amiodarone for rate control.   -Xarelto for anticoagulation.   -Cardiology had been following earlier, since signed off  7.  Prolonged QTC -Patient noted to have a QTC of 560 on admission, chronically on amiodarone. -Repeat EKG with resolution of QT prolongation currently at 437. -Plan to keep potassium approximately at 4, magnesium approximately at 2.  8.  Chronic hyponatremia -Sodium up to 131 -Has documented hx of chronic hyponatremia. Clinically stable  9.  Chronic venous stasis of bilateral lower extremities/venous insufficiency with ulceration -Continue current  wound treatment recommendations per [place] RN  10.  Hypomagnesemia/hypokalemia -Replaced  -Repeat lytes in AM  11.  Moderate protein  calorie malnutrition -Patient cachectic and frail on examination. -Albumin level at 2.3 on admission. -Diet liberalized.  -Status post IV albumin every 6 hours x1 day.   -Albumin currently at 2.1 (09/07/2021).   -Continue Ensure nutritional supplementation.   -RD was consulted.  12.??  Pulmonary hypertension -2D echo concerning for pulmonary hypertension felt likely in the setting of group 3 disease with chronic respiratory failure/COPD per PCCM.  Patient would benefit from a sleep study in the outpatient setting.  13.  Goals of care -It is noted that palliative care was to follow-up with patient after recent discharge, patient is a DO NOT INTUBATE.  Patient does express desire for no CPR as she feels could break her ribs.  Patient is accepting of ACLS medications if needed.  CODE STATUS was later changed to limited code.   -Palliative care has assessed patient and current recommendations are to continue current plan, recommend outpatient palliative referral -Appreciate Palliative Care input   DVT prophylaxis: Xarelto Code Status: Partial Family Communication: Pt in room, family not at bedside  Status is: Inpatient  Remains inpatient appropriate because: severity of illness    Consultants:  WOC Phineas Douglas, RN 09/02/2021 PCCM: Dr.Olalere 09/04/2021 Cardiology: Dr. Percival Spanish 09/04/2021 Palliative care: Mariana Kaufman, NP 09/08/2021  Procedures:    Antimicrobials: Anti-infectives (From admission, onward)    Start     Dose/Rate Route Frequency Ordered Stop   09/04/21 1200  piperacillin-tazobactam (ZOSYN) IVPB 3.375 g  Status:  Discontinued        3.375 g 100 mL/hr over 30 Minutes Intravenous Every 6 hours 09/04/21 0955 09/04/21 0957   09/04/21 1100  piperacillin-tazobactam (ZOSYN) IVPB 3.375 g        3.375 g 12.5 mL/hr over 240 Minutes  Intravenous Every 8 hours 09/04/21 0959 09/11/21 0000   09/02/21 1600  ampicillin-sulbactam (UNASYN) 1.5 g in sodium chloride 0.9 % 100 mL IVPB  Status:  Discontinued        1.5 g 200 mL/hr over 30 Minutes Intravenous Every 6 hours 09/02/21 1006 09/04/21 0955   09/02/21 1000  cefTRIAXone (ROCEPHIN) 2 g in sodium chloride 0.9 % 100 mL IVPB  Status:  Discontinued        2 g 200 mL/hr over 30 Minutes Intravenous Every 24 hours 09/01/21 1551 09/01/21 1819   09/02/21 1000  azithromycin (ZITHROMAX) 500 mg in sodium chloride 0.9 % 250 mL IVPB  Status:  Discontinued        500 mg 250 mL/hr over 60 Minutes Intravenous Every 24 hours 09/01/21 1551 09/01/21 1720   09/01/21 2359  Ampicillin-Sulbactam (UNASYN) 3 g in sodium chloride 0.9 % 100 mL IVPB  Status:  Discontinued        3 g 200 mL/hr over 30 Minutes Intravenous Every 8 hours 09/01/21 1823 09/02/21 1006   09/01/21 1615  cefTRIAXone (ROCEPHIN) 1 g in sodium chloride 0.9 % 100 mL IVPB  Status:  Discontinued        1 g 200 mL/hr over 30 Minutes Intravenous  Once 09/01/21 1602 09/01/21 1720   09/01/21 1445  azithromycin (ZITHROMAX) 500 mg in sodium chloride 0.9 % 250 mL IVPB        500 mg 250 mL/hr over 60 Minutes Intravenous Every 24 hours 09/01/21 1440 09/01/21 1606   09/01/21 1445  cefTRIAXone (ROCEPHIN) 1 g in sodium chloride 0.9 % 100 mL IVPB        1 g 200 mL/hr over 30 Minutes Intravenous Every 24 hours 09/01/21 1440 09/01/21 1532  Subjective: No complaints this AM. Remains on 8LNC  Objective: Vitals:   09/13/21 0446 09/13/21 0748 09/13/21 0954 09/13/21 1325  BP: (!) 110/57  (!) 102/57 (!) 107/54  Pulse: 68  72 71  Resp: 16   19  Temp: 98.2 F (36.8 C)   98.1 F (36.7 C)  TempSrc: Oral     SpO2: 93% 95% 95% 98%  Weight:      Height:        Intake/Output Summary (Last 24 hours) at 09/13/2021 1728 Last data filed at 09/13/2021 2751 Gross per 24 hour  Intake 480 ml  Output 2000 ml  Net -1520 ml    Filed Weights    09/02/21 1900 09/04/21 1000 09/10/21 1746  Weight: 41.9 kg 44 kg 46.3 kg    Examination: General exam: Conversant, in no acute distress Respiratory system: normal chest rise, clear, no audible wheezing Cardiovascular system: regular rhythm, s1-s2 Gastrointestinal system: Nondistended, nontender, pos BS Central nervous system: No seizures, no tremors Extremities: No cyanosis, no joint deformities Skin: No rashes, no pallor Psychiatry: Affect normal // no auditory hallucinations   Data Reviewed: I have personally reviewed following labs and imaging studies  CBC: Recent Labs  Lab 09/07/21 0241 09/08/21 0254 09/09/21 0239 09/10/21 0243 09/11/21 0238  WBC 17.2* 18.6* 16.5* 14.9* 14.9*  NEUTROABS 13.5* 14.7*  --  11.3*  --   HGB 8.9* 8.3* 8.6* 8.0* 8.0*  HCT 27.5* 26.0* 26.9* 25.5* 25.7*  MCV 88.7 88.1 89.7 91.1 91.5  PLT 552* 572* 603* 580* 641*    Basic Metabolic Panel: Recent Labs  Lab 09/07/21 0241 09/08/21 0254 09/09/21 0239 09/10/21 0243 09/11/21 0238 09/12/21 0448 09/13/21 0550  NA 128* 128* 130* 129* 127* 131* 130*  K 4.0 4.6 4.7 4.5 4.7 4.9 5.1  CL 92* 91* 92* 92* 88* 92* 92*  CO2 _0 33* 33* 32  GLUCOSE 124* 81 82 81 90 83 81  BUN _1 CREATININE 0.61 0.70 0.59 0.64 0.64 0.64 0.58  CALCIUM 8.1* 7.9* 8.1* 8.0* 7.8* 8.2* 8.6*  MG 1.7 1.9  --  1.5*  --  2.0  --   PHOS 2.7  --   --  3.6  --   --   --     GFR: Estimated Creatinine Clearance: 43.7 mL/min (by C-G formula based on SCr of 0.58 mg/dL). Liver Function Tests: Recent Labs  Lab 09/07/21 0241 09/10/21 0243 09/11/21 0238 09/12/21 0448 09/13/21 0550  AST  --   --  11* 13* 17  ALT  --   --  _2 ALKPHOS  --   --  105 100 117  BILITOT  --   --  0.4 0.3 0.4  PROT  --   --  5.3* 5.6* 6.2*  ALBUMIN 2.1* 1.9* 1.9* 1.9* 2.2*    No results for input(s): LIPASE, AMYLASE in the last 168 hours. No results for input(s): AMMONIA in the last 168 hours. Coagulation  Profile: No results for input(s): INR, PROTIME in the last 168 hours. Cardiac Enzymes: No results for input(s): CKTOTAL, CKMB, CKMBINDEX, TROPONINI in the last 168 hours. BNP (last 3 results) No results for input(s): PROBNP in the last 8760 hours. HbA1C: No results for input(s): HGBA1C in the last 72 hours. CBG: No results for input(s): GLUCAP in the last 168 hours. Lipid Profile: No results for input(s): CHOL, HDL, LDLCALC, TRIG, CHOLHDL, LDLDIRECT in the last 72 hours. Thyroid Function Tests:  No results for input(s): TSH, T4TOTAL, FREET4, T3FREE, THYROIDAB in the last 72 hours. Anemia Panel: No results for input(s): VITAMINB12, FOLATE, FERRITIN, TIBC, IRON, RETICCTPCT in the last 72 hours. Sepsis Labs: No results for input(s): PROCALCITON, LATICACIDVEN in the last 168 hours.  Recent Results (from the past 240 hour(s))  Expectorated Sputum Assessment w Gram Stain, Rflx to Resp Cult     Status: None   Collection Time: 09/05/21 12:13 PM   Specimen: Sputum  Result Value Ref Range Status   Specimen Description SPU  Final   Special Requests NONE  Final   Sputum evaluation   Final    THIS SPECIMEN IS ACCEPTABLE FOR SPUTUM CULTURE Performed at [facility], [place] [address] [place], [place] [address]    Report Status 09/05/2021 FINAL  Final  Culture, Respiratory w Gram Stain     Status: None   Collection Time: 09/05/21 12:13 PM   Specimen: Sputum  Result Value Ref Range Status   Specimen Description   Final    SPU Performed at [facility] [address], [place], [place] [address]    Special Requests   Final    NONE Reflexed from [id number] Performed at [facility], [place] [address] [place], [place] [address]    Gram Stain   Final    ABUNDANT WBC PRESENT,BOTH PMN AND MONONUCLEAR FEW SQUAMOUS EPITHELIAL CELLS PRESENT NO ORGANISMS SEEN    Culture   Final    RARE Normal respiratory flora-no Staph aureus or Pseudomonas  seen Performed at [facility], [address] [place], [place] [address]    Report Status 09/08/2021 FINAL  Final      Radiology Studies: No results found.  Scheduled Meds:  (feeding supplement) PROSource Plus  30 mL Oral BID BM   acetaminophen  500 mg Oral TID   amiodarone  200 mg Oral Daily   arformoterol  15 mcg Nebulization BID   bisacodyl  10 mg Rectal Daily   budesonide (PULMICORT) nebulizer solution  0.5 mg Nebulization BID   busPIRone  10 mg Oral Daily   clopidogrel  75 mg Oral Daily   feeding supplement  237 mL Oral BID BM   fluticasone  2 spray Each Nare Daily   guaiFENesin  1,200 mg Oral BID   loratadine  10 mg Oral Daily   mouth rinse  15 mL Mouth Rinse BID   midodrine  5 mg Oral TID WC   multivitamin with minerals  1 tablet Oral Daily   pantoprazole  40 mg Oral Q0600   polyethylene glycol  17 g Oral BID   Rivaroxaban  15 mg Oral Q breakfast   Continuous Infusions:  sodium chloride Stopped (09/08/21 1414)     LOS: 11 days   Marylu Lund, MD [facility] Pager On Amion  If 7PM-7AM, please contact night-coverage 09/13/2021, 5:28 PM

## 2021-09-13 NOTE — Progress Notes (Signed)
   09/13/21 2058  Vitals  Temp 97.7 F (36.5 C)  Temp Source Oral  BP (!) 100/56  MAP (mmHg) 68  BP Location Left Arm  BP Method Automatic  Patient Position (if appropriate) Lying  Pulse Rate 66  Resp 20  MEWS COLOR  MEWS Score Color Green  Oxygen Therapy  SpO2 94 %  O2 Flow Rate (L/min) 6 L/min  MEWS Score  MEWS Temp 0  MEWS Systolic 1  MEWS Pulse 0  MEWS RR 0  MEWS LOC 0  MEWS Score 1  Messeged Blount NP at 2115 informing of lowering B/P.

## 2021-09-14 DIAGNOSIS — J9601 Acute respiratory failure with hypoxia: Secondary | ICD-10-CM | POA: Diagnosis not present

## 2021-09-14 LAB — CBC
HCT: 25.9 % — ABNORMAL LOW (ref 36.0–46.0)
Hemoglobin: 8 g/dL — ABNORMAL LOW (ref 12.0–15.0)
MCH: 28.3 pg (ref 26.0–34.0)
MCHC: 30.9 g/dL (ref 30.0–36.0)
MCV: 91.5 fL (ref 80.0–100.0)
Platelets: 785 10*3/uL — ABNORMAL HIGH (ref 150–400)
RBC: 2.83 MIL/uL — ABNORMAL LOW (ref 3.87–5.11)
RDW: 14.4 % (ref 11.5–15.5)
WBC: 11.4 10*3/uL — ABNORMAL HIGH (ref 4.0–10.5)
nRBC: 0 % (ref 0.0–0.2)

## 2021-09-14 LAB — COMPREHENSIVE METABOLIC PANEL
ALT: 11 U/L (ref 0–44)
AST: 12 U/L — ABNORMAL LOW (ref 15–41)
Albumin: 2 g/dL — ABNORMAL LOW (ref 3.5–5.0)
Alkaline Phosphatase: 105 U/L (ref 38–126)
Anion gap: 6 (ref 5–15)
BUN: 13 mg/dL (ref 8–23)
CO2: 31 mmol/L (ref 22–32)
Calcium: 8.3 mg/dL — ABNORMAL LOW (ref 8.9–10.3)
Chloride: 90 mmol/L — ABNORMAL LOW (ref 98–111)
Creatinine, Ser: 0.69 mg/dL (ref 0.44–1.00)
GFR, Estimated: >60 mL/min (ref >60)
Glucose, Bld: 105 mg/dL — ABNORMAL HIGH (ref 70–99)
Potassium: 4.8 mmol/L (ref 3.5–5.1)
Sodium: 127 mmol/L — ABNORMAL LOW (ref 135–145)
Total Bilirubin: 0.2 mg/dL — ABNORMAL LOW (ref 0.3–1.2)
Total Protein: 5.9 g/dL — ABNORMAL LOW (ref 6.5–8.1)

## 2021-09-14 NOTE — Progress Notes (Signed)
No new orders for pt soft B/P will continue to monitor.

## 2021-09-14 NOTE — Progress Notes (Signed)
09/14/21 2046  Assess: MEWS Score  Temp 98.6 F (37 C)  BP (!) 99/50  Pulse Rate 76  Resp (!) 24  Level of Consciousness Alert  SpO2 92 %  O2 Device Nasal Cannula  Assess: MEWS Score  MEWS Temp 0  MEWS Systolic 1  MEWS Pulse 0  MEWS RR 1  MEWS LOC 0  MEWS Score 2  MEWS Score Color Yellow  Assess: if the MEWS score is Yellow or Red  Were vital signs taken at a resting state? Yes  Focused Assessment No change from prior assessment  Does the patient meet 2 or more of the SIRS criteria? No  MEWS guidelines implemented *See Row Information* No, vital signs rechecked  Treat  MEWS Interventions Administered prn meds/treatments  Pain Scale 0-10  Pain Score 6  Pain Type Chronic pain  Breathing 0  Consolability 0  Interventions Medication (see MAR);Relaxation  Take Vital Signs  Increase Vital Sign Frequency  Yellow: Q 2hr X 2 then Q 4hr X 2, if remains yellow, continue Q 4hrs  Escalate  MEWS: Escalate Yellow: discuss with charge nurse/RN and consider discussing with provider and RRT  Notify: Charge Nurse/RN  Name of Charge Nurse/RN Notified Vera,RN  Date Charge Nurse/RN Notified 09/14/21  Time Charge Nurse/RN Notified 2050  Notify: Provider  Provider Name/Title  Kennon Holter)  Date Provider Notified 09/14/21  Time Provider Notified 2350  Notification Type Page  Notification Reason Other (Comment) (inform)  Notify: Rapid Response  Name of Rapid Response RN Notified no  Document  Patient Outcome  (reassessed)  Progress note created (see row info) Yes  Assess: SIRS CRITERIA  SIRS Temperature  0  SIRS Pulse 0  SIRS Respirations  1  SIRS WBC 0  SIRS Score Sum  1  Pt was briefly in yellow, was anxious just completed breathing trx. Assessed pt and talked to calm her down. Rechecked vitals 30 min on left arm. Green MEWS see next column. Continued to monitor.

## 2021-09-14 NOTE — Progress Notes (Signed)
PROGRESS NOTE    Kristen [name]  LKJ:[id number] DOB: Nov 27, 1945 DOA: 09/01/2021 PCP: [name], MD    Brief Narrative:  Kristen [name] is a 75 y.o. female with medical history significant of COPD with chronic hypoxic respiratory failure on 3 L/min oxygen at baseline, chronic A-fib, venous insufficiency, hx of transient hypotension, hospital admission in July of this year for aspiration pneumonia and acute on chronic hypoxic respiratory failure.  She was brought to [facility] ED from home by EMS due to ongoing diarrhea x 5 days, productive cough with associated right-sided chest pain and chills, with progressive generalized weakness.  Denies abdominal pain, nausea, vomiting, dysuria, focal weakness numbness or tingling, headaches or other recent symptoms.  Reports increasing her oxygen briefly to 3.5 L/min mostly due to feeling anxious.  Otherwise has been on her usual 3 L/min. Chest xray showed new extensive consolidation in the lateral right middle and lower lung, consistent with PNA, small right pleural effusion and chronic hyperexpansion compatible with emphysema. Patient is followed by the IM residency service at [facility], but this states she would prefer to be admitted here to [facility] this time. Patient admitted placed empirically on IV antibiotics, respiratory status worsen and IV antibiotics broadened, PCCM consulted and following.  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 -On admission patient noted to have met criteria for sepsis with leukocytosis, tachypnea, pneumonia. -Patient with a rising leukocytosis however started to slowly trend back down with broadening of IV antibiotic coverage..   -Most recent WBC of 14.9 from a peak of 20 -O2 down to 4L this AM, baseline is 3 L nasal cannula. -Blood cultures pending with no growth to date x5 days, MRSA PCR negative.  Urine cultures with multiple species. -Sputum gram stain and culture with no organisms seen. -Clinically still improving, continue to wean O2 as tolerated -Completed 7-day course of Zosyn -Continue Brovana, Pulmicort, scheduled Xopenex and Atrovent nebs, Mucinex, Claritin, Flonase. -Recommend repeat imaging in 6 to 8 weeks for resolution of dense right upper lobe pneumonia per PCCM recommendations. -Therapy recs for HHPT at time of d/c -Appreciate Palliative Care input  2.  Hypotension -Patient with systolic blood pressures noted to be low in the 80s on 09/03/2021.   -Random cortisol 16.8 on 09/05/2021. -IV albumin every 6 hours x24 hours given with some improvement with blood pressure however blood pressure was soft on 09/05/2021 and patient became hypotensive requiring Levophed pressor which has been weaned off as of 09/06/2021.   -Patient started on midodrine per PCCM. -Systolic blood pressures overall stable -Diuretics subsequently discontinued -Cont with supportive care.   -PCCM had been following, since signed off  3.  Acute on chronic hypoxic respiratory failure in the setting of  COPD likely secondary to aspiration pneumonia and probable volume overload/acute diastolic CHF exacerbation/abnormal 2D  echo -Likely secondary to pneumonia/aspiration pneumonia in the setting of possible volume overload. -Had been BiPAP dependent, now on 15 L high flow O2 -Continue to wean O2 as tolerated -BNP obtained at 942.6. -2D echo from 10/23/2020 with a EF of 55 to 29%,FAOZ, grade 1 diastolic dysfunction, interventricular septum flattened in diastole consistent with right ventricular volume overload, low normal right ventricular systolic function, right ventricular size normal. -2D echo from 09/05/2021 with EF of 50%, left ventricle with wall motion abnormalities, left ventricular diastolic parameters indeterminate, interventricular septum is flattened in systole consistent with right ventricular pressure overload, mildly reduced right ventricular systolic function, right ventricular size is moderately to severely enlarged, moderately elevated pulmonary artery systolic pressure, right atrial size moderately dilated. -Blood pressure improved post IV albumin however patient remained hypotensive on 09/05/2021 requiring placement on pressors/Levophed.  -Levophed pressors weaned off 09/06/2021 and patient placed on midodrine. -Repeat chest x-ray from 09/04/2021 with increased confluent opacity over the right upper lobe with worsening variation throughout remainder of right lung concerning for worsening pneumonia with a persistent parapneumonic effusion.   -Repeat chest x-ray from 09/05/2021 with multilobular infiltrate and small effusions, most likely pneumonia, mild treatment right upper lobe infiltrate.  -ABG with a pH of 7.394, PCO2 of 44, PO2 of 58.5. -Status post Lasix 20 mg IV x1 on 09/04/2021 with urine output of 800 cc recorded.   -Diuretics now discontinued. -Strict I's and O's.  -Continue scheduled Xopenex and Atrovent nebs, Pulmicort, Brovana, Claritin, PPI, Flonase -- Patient completed 7-day course of Zosyn, abx now d/c'd  4.  Elevated troponin/abnormal 2D echo -Patient with acute on chronic  respiratory distress on 09/04/2021, noted to have elevated BNP with concerns for volume overload.  Cardiac enzymes were elevated. -2D echo with EF of 50%, left ventricle with wall motion abnormalities, left ventricular diastolic parameters indeterminate, interventricular septum is flattened in systole consistent with right ventricular pressure overload, mildly reduced right ventricular systolic function, right ventricular size is moderately to severely enlarged, moderately elevated pulmonary artery systolic pressure, right atrial size moderately dilated. -Patient seen in consultation by cardiology this point in time suspect no need for further evaluation given her comorbid illness and frailty. -Patient noted to be hypotensive was on pressors which have been weaned off.  Hold off on starting beta-blocker.  -Cardiology recommending continuation of empiric therapy with Plavix with no plans for invasive evaluation at this time. -Cardiology has signed off and recommending outpatient follow-up with Dr. Johney Frame in the outpatient setting. -Continued with midodrine as tolerated  5.  Diarrhea>>>.  Constipation -Patient had reported 5 days of diarrhea at home prior to admission. -No bowel movement since admission. -Patient with complaints of constipation. -Continue current bowel regimen as needed  6.  Paroxysmal atrial fibrillation -Amiodarone for rate control.   -Xarelto for anticoagulation.   -Cardiology had been following earlier, since signed off  7.  Prolonged QTC -Patient noted to have a QTC of 560 on admission, chronically on amiodarone. -Repeat EKG with resolution of QT prolongation currently at 437. -Plan to keep potassium approximately at 4, magnesium approximately at 2. -Repeat basic metabolic panel in the morning  8.  Chronic hyponatremia -Sodium overall stable at 127 today, patient is asymptomatic -Has documented hx of chronic hyponatremia. -Repeat basic metabolic panel in the  morning  9.  Chronic venous stasis of bilateral lower extremities/venous insufficiency with ulceration -Continue current wound treatment recommendations per Yantis RN  10.  Hypomagnesemia/hypokalemia -Replaced  -Repeat lytes in AM  11.  Moderate protein calorie malnutrition -Patient cachectic and frail on examination. -Albumin level at 2.3 on admission. -Diet liberalized.  -Status post IV albumin every 6 hours x1 day.   -Albumin currently at 2.1 (09/07/2021).   -Continue Ensure nutritional supplementation.   -RD was consulted.  12.??  Pulmonary hypertension -2D echo concerning for pulmonary hypertension felt likely in the setting of group 3 disease with chronic respiratory failure/COPD per PCCM.  Patient would benefit from a sleep study in the outpatient setting.  13.  Goals of care -It is noted that palliative care was to follow-up with patient after recent discharge, patient is a DO NOT INTUBATE.  Patient does express desire for no CPR as she feels could break her ribs.  Patient is accepting of ACLS medications if needed.  CODE STATUS was later changed to limited code.   -Palliative care has assessed patient and current recommendations are to continue current plan, recommend outpatient palliative referral -Appreciate Palliative Care input   DVT prophylaxis: Xarelto Code Status: Partial Family Communication: Pt in room, family not at bedside  Status is: Inpatient  Remains inpatient appropriate because: severity of illness    Consultants:  [place], RN 09/02/2021 PCCM: Dr.Olalere 09/04/2021 Cardiology: Dr. Percival Spanish 09/04/2021 Palliative care: Mariana Kaufman, NP 09/08/2021  Procedures:    Antimicrobials: Anti-infectives (From admission, onward)    Start     Dose/Rate Route Frequency Ordered Stop   09/04/21 1200  piperacillin-tazobactam (ZOSYN) IVPB 3.375 g  Status:  Discontinued        3.375 g 100 mL/hr over 30 Minutes Intravenous Every 6 hours 09/04/21 0955  09/04/21 0957   09/04/21 1100  piperacillin-tazobactam (ZOSYN) IVPB 3.375 g        3.375 g 12.5 mL/hr over 240 Minutes Intravenous Every 8 hours 09/04/21 0959 09/11/21 0000   09/02/21 1600  ampicillin-sulbactam (UNASYN) 1.5 g in sodium chloride 0.9 % 100 mL IVPB  Status:  Discontinued        1.5 g 200 mL/hr over 30 Minutes Intravenous Every 6 hours 09/02/21 1006 09/04/21 0955   09/02/21 1000  cefTRIAXone (ROCEPHIN) 2 g in sodium chloride 0.9 % 100 mL IVPB  Status:  Discontinued        2 g 200 mL/hr over 30 Minutes Intravenous Every 24 hours 09/01/21 1551 09/01/21 1819   09/02/21 1000  azithromycin (ZITHROMAX) 500 mg in sodium chloride 0.9 % 250 mL IVPB  Status:  Discontinued        500 mg 250 mL/hr over 60 Minutes Intravenous Every 24 hours 09/01/21 1551 09/01/21 1720   09/01/21 2359  Ampicillin-Sulbactam (UNASYN) 3 g in sodium chloride 0.9 % 100 mL IVPB  Status:  Discontinued        3 g 200 mL/hr over 30 Minutes Intravenous Every 8 hours 09/01/21 1823 09/02/21 1006   09/01/21 1615  cefTRIAXone (ROCEPHIN) 1 g in sodium chloride 0.9 % 100 mL IVPB  Status:  Discontinued        1 g 200 mL/hr over 30 Minutes Intravenous  Once 09/01/21 1602 09/01/21 1720   09/01/21 1445  azithromycin (ZITHROMAX) 500 mg in sodium chloride 0.9 % 250 mL IVPB        500 mg 250 mL/hr over 60 Minutes Intravenous Every 24 hours 09/01/21 1440 09/01/21 1606   09/01/21 1445  cefTRIAXone (ROCEPHIN) 1 g in sodium chloride 0.9 % 100 mL IVPB        1  g 200 mL/hr over 30 Minutes Intravenous Every 24 hours 09/01/21 1440 09/01/21 1532       Subjective: Reports feeling mildly short of breath this morning while on 4 L nasal cannula  Objective: Vitals:   09/14/21 0653 09/14/21 0736 09/14/21 0737 09/14/21 1347  BP:    96/61  Pulse:    76  Resp:    18  Temp:    98.1 F (36.7 C)  TempSrc:      SpO2: 98% 98% 98% 94%  Weight:      Height:        Intake/Output Summary (Last 24 hours) at 09/14/2021 1643 Last data  filed at 09/14/2021 1058 Gross per 24 hour  Intake 960 ml  Output 1150 ml  Net -190 ml    Filed Weights   09/02/21 1900 09/04/21 1000 09/10/21 1746  Weight: 41.9 kg 44 kg 46.3 kg    Examination: General exam: Awake, laying in bed, in nad Respiratory system: Normal respiratory effort, no wheezing Cardiovascular system: regular rate, s1, s2 Gastrointestinal system: Soft, nondistended, positive BS Central nervous system: CN2-12 grossly intact, strength intact Extremities: Perfused, no clubbing Skin: Normal skin turgor, no notable skin lesions seen Psychiatry: Mood normal // no visual hallucinations   Data Reviewed: I have personally reviewed following labs and imaging studies  CBC: Recent Labs  Lab 09/08/21 0254 09/09/21 0239 09/10/21 0243 09/11/21 0238 09/14/21 0501  WBC 18.6* 16.5* 14.9* 14.9* 11.4*  NEUTROABS 14.7*  --  11.3*  --   --   HGB 8.3* 8.6* 8.0* 8.0* 8.0*  HCT 26.0* 26.9* 25.5* 25.7* 25.9*  MCV 88.1 89.7 91.1 91.5 91.5  PLT 572* 603* 580* 641* 785*    Basic Metabolic Panel: Recent Labs  Lab 09/08/21 0254 09/09/21 0239 09/10/21 0243 09/11/21 0238 09/12/21 0448 09/13/21 0550 09/14/21 0501  NA 128*   < > 129* 127* 131* 130* 127*  K 4.6   < > 4.5 4.7 4.9 5.1 4.8  CL 91*   < > 92* 88* 92* 92* 90*  CO2 31   < > 30 33* 33* 32 31  GLUCOSE 81   < > 81 90 83 81 105*  BUN 16   < > _0 CREATININE 0.70   < > 0.64 0.64 0.64 0.58 0.69  CALCIUM 7.9*   < > 8.0* 7.8* 8.2* 8.6* 8.3*  MG 1.9  --  1.5*  --  2.0  --   --   PHOS  --   --  3.6  --   --   --   --    < > = values in this interval not displayed.    GFR: Estimated Creatinine Clearance: 43.7 mL/min (by C-G formula based on SCr of 0.69 mg/dL). Liver Function Tests: Recent Labs  Lab 09/10/21 0243 09/11/21 0238 09/12/21 0448 09/13/21 0550 09/14/21 0501  AST  --  11* 13* 17 12*  ALT  --  _1 ALKPHOS  --  105 100 117 105  BILITOT  --  0.4 0.3 0.4 0.2*  PROT  --  5.3* 5.6* 6.2*  5.9*  ALBUMIN 1.9* 1.9* 1.9* 2.2* 2.0*    No results for input(s): LIPASE, AMYLASE in the last 168 hours. No results for input(s): AMMONIA in the last 168 hours. Coagulation Profile: No results for input(s): INR, PROTIME in the last 168 hours. Cardiac Enzymes: No results for input(s): CKTOTAL, CKMB, CKMBINDEX, TROPONINI in the last 168 hours. BNP (  last 3 results) No results for input(s): PROBNP in the last 8760 hours. HbA1C: No results for input(s): HGBA1C in the last 72 hours. CBG: No results for input(s): GLUCAP in the last 168 hours. Lipid Profile: No results for input(s): CHOL, HDL, LDLCALC, TRIG, CHOLHDL, LDLDIRECT in the last 72 hours. Thyroid Function Tests: No results for input(s): TSH, T4TOTAL, FREET4, T3FREE, THYROIDAB in the last 72 hours. Anemia Panel: No results for input(s): VITAMINB12, FOLATE, FERRITIN, TIBC, IRON, RETICCTPCT in the last 72 hours. Sepsis Labs: No results for input(s): PROCALCITON, LATICACIDVEN in the last 168 hours.  Recent Results (from the past 240 hour(s))  Expectorated Sputum Assessment w Gram Stain, Rflx to Resp Cult     Status: None   Collection Time: 09/05/21 12:13 PM   Specimen: Sputum  Result Value Ref Range Status   Specimen Description SPU  Final   Special Requests NONE  Final   Sputum evaluation   Final    THIS SPECIMEN IS ACCEPTABLE FOR SPUTUM CULTURE Performed at [facility], [place] [address], [place], [place] [address]    Report Status 09/05/2021 FINAL  Final  Culture, Respiratory w Gram Stain     Status: None   Collection Time: 09/05/21 12:13 PM   Specimen: Sputum  Result Value Ref Range Status   Specimen Description   Final    SPU Performed at [place] [address] [place], [place] [address]    Special Requests   Final    NONE Reflexed from [id number] Performed at [facility], [place] [address], [place], [place] [address]    Gram Stain   Final    ABUNDANT WBC  PRESENT,BOTH PMN AND MONONUCLEAR FEW SQUAMOUS EPITHELIAL CELLS PRESENT NO ORGANISMS SEEN    Culture   Final    RARE Normal respiratory flora-no Staph aureus or Pseudomonas seen Performed at [facility], [address] [place], [place] [address]    Report Status 09/08/2021 FINAL  Final      Radiology Studies: No results found.  Scheduled Meds:  (feeding supplement) PROSource Plus  30 mL Oral BID BM   amiodarone  200 mg Oral Daily   arformoterol  15 mcg Nebulization BID   bisacodyl  10 mg Rectal Daily   budesonide (PULMICORT) nebulizer solution  0.5 mg Nebulization BID   busPIRone  10 mg Oral Daily   clopidogrel  75 mg Oral Daily   feeding supplement  237 mL Oral BID BM   fluticasone  2 spray Each Nare Daily   guaiFENesin  1,200 mg Oral BID   loratadine  10 mg Oral Daily   mouth rinse  15 mL Mouth Rinse BID   midodrine  5 mg Oral TID WC   multivitamin with minerals  1 tablet Oral Daily   pantoprazole  40 mg Oral Q0600   polyethylene glycol  17 g Oral BID   Rivaroxaban  15 mg Oral Q breakfast   Continuous Infusions:  sodium chloride Stopped (09/08/21 1414)     LOS: 12 days   Marylu Lund, MD [facility] Pager On Amion  If 7PM-7AM, please contact night-coverage 09/14/2021, 4:43 PM

## 2021-09-15 ENCOUNTER — Encounter (HOSPITAL_COMMUNITY): Payer: Self-pay | Admitting: Internal Medicine

## 2021-09-15 DIAGNOSIS — I480 Paroxysmal atrial fibrillation: Secondary | ICD-10-CM | POA: Diagnosis not present

## 2021-09-15 LAB — COMPREHENSIVE METABOLIC PANEL
ALT: 11 U/L (ref 0–44)
AST: 13 U/L — ABNORMAL LOW (ref 15–41)
Albumin: 2 g/dL — ABNORMAL LOW (ref 3.5–5.0)
Alkaline Phosphatase: 122 U/L (ref 38–126)
Anion gap: 4 — ABNORMAL LOW (ref 5–15)
BUN: 16 mg/dL (ref 8–23)
CO2: 34 mmol/L — ABNORMAL HIGH (ref 22–32)
Calcium: 8.5 mg/dL — ABNORMAL LOW (ref 8.9–10.3)
Chloride: 89 mmol/L — ABNORMAL LOW (ref 98–111)
Creatinine, Ser: 0.69 mg/dL (ref 0.44–1.00)
GFR, Estimated: >60 mL/min (ref >60)
Glucose, Bld: 87 mg/dL (ref 70–99)
Potassium: 4.6 mmol/L (ref 3.5–5.1)
Sodium: 127 mmol/L — ABNORMAL LOW (ref 135–145)
Total Bilirubin: 0.5 mg/dL (ref 0.3–1.2)
Total Protein: 5.8 g/dL — ABNORMAL LOW (ref 6.5–8.1)

## 2021-09-15 NOTE — Care Management Important Message (Signed)
Important Message  Patient Details IM Letter placed in Patients room. Name: Kristen [name] MRN: [id number] Date of Birth: 12-07-1945   Medicare Important Message Given:  Yes     Kerin Salen 09/15/2021, 11:51 AM

## 2021-09-15 NOTE — TOC Progression Note (Signed)
Transition of Care [facility]) - Progression Note    Patient Details  Name: Kristen [name] MRN: [id number] Date of Birth: 08-04-1946  Transition of Care [facility]) CM/SW [place], [place] Phone Number: 09/15/2021, 3:53 PM  Clinical Narrative:  Met with patient to go over plan post d/c.  She is still on higher level of O2 than at home, still feels short of breath, is wanting to have a "plan B" in place.  Bed search initiated. TOC will continue to follow during the course of hospitalization.      Expected Discharge Plan: Skilled Nursing Facility Barriers to Discharge: SNF Pending bed offer  Expected Discharge Plan and Services Expected Discharge Plan: [facility]   Discharge Planning Services: CM Consult Post Acute Care Choice: [facility] arrangements for the past 2 months: Single Family Home                                       Social Determinants of Health (SDOH) Interventions    Readmission Risk Interventions Readmission Risk Prevention Plan 06/17/2021 06/02/2021 09/29/2020  Transportation Screening Complete Complete Complete  PCP or Specialist Appt within 5-7 Days - - Complete  Home Care Screening - - Complete  Medication Review (RN CM) - - Complete  Medication Review ([place]) - Complete -  PCP or Specialist appointment within 3-5 days of discharge - Complete -  [place] or Home Care Consult - Complete -  SW Recovery Care/Counseling Consult Complete Complete -  Palliative Care Screening Complete Complete -  Skilled Nursing Facility Complete Complete -  Some recent data might be hidden

## 2021-09-15 NOTE — Progress Notes (Signed)
PROGRESS NOTE    Kristen [name]  EML:[id number] DOB: [birth year] DOA: 09/01/2021 PCP: [name], MD    Brief Narrative:  Kristen [name] is a 75 y.o. female with medical history significant of COPD with chronic hypoxic respiratory failure on 3 L/min oxygen at baseline, chronic A-fib, venous insufficiency, hx of transient hypotension, hospital admission in July of this year for aspiration pneumonia and acute on chronic hypoxic respiratory failure.  She was brought to [facility] ED from home by EMS due to ongoing diarrhea x 5 days, productive cough with associated right-sided chest pain and chills, with progressive generalized weakness.  Denies abdominal pain, nausea, vomiting, dysuria, focal weakness numbness or tingling, headaches or other recent symptoms.  Reports increasing her oxygen briefly to 3.5 L/min mostly due to feeling anxious.  Otherwise has been on her usual 3 L/min. Chest xray showed new extensive consolidation in the lateral right middle and lower lung, consistent with PNA, small right pleural effusion and chronic hyperexpansion compatible with emphysema. Patient is followed by the IM residency service at [facility], but this states she would prefer to be admitted here to [facility] this time. Patient admitted placed empirically on IV antibiotics, respiratory status worsen and IV antibiotics broadened, PCCM consulted and following.  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 -On admission patient noted to have met criteria for sepsis with leukocytosis, tachypnea, pneumonia. -Patient with a rising leukocytosis however started to slowly trend back down with broadening of IV antibiotic coverage..   -Most recent WBC of 14.9 from a peak of 20 -O2 at [facility], weaning down to [facility]. Cont to monitor and if pt is able to main O2 sats on 3LNC, may consider d/c soon -Blood cultures pending with no growth to date x5 days, MRSA PCR negative.  Urine cultures with multiple species. -Sputum gram stain and culture with no organisms seen. -Clinically still improving, continue to wean O2 as tolerated -Completed 7-day course of Zosyn -Continue Brovana, Pulmicort, scheduled Xopenex and Atrovent nebs, Mucinex, Claritin, Flonase. -Recommend repeat imaging in 6 to 8 weeks for resolution of dense right upper lobe pneumonia per PCCM recommendations. -Therapy recs for HHPT at time of d/c -Appreciate Palliative Care input  2.  Hypotension -Patient with systolic blood pressures noted to be low in the 80s on 09/03/2021.   -Random cortisol 16.8 on 09/05/2021. -IV albumin every 6 hours x24 hours given with some improvement with blood pressure however blood pressure was soft on 09/05/2021 and patient became hypotensive requiring Levophed pressor which has been weaned off as of 09/06/2021.   -Patient started on midodrine per PCCM. -Systolic blood pressures overall stable -Diuretics subsequently discontinued -Cont with supportive care.   -PCCM had been following, since signed off  3.  Acute on chronic hypoxic respiratory failure in the setting of COPD likely secondary to aspiration pneumonia and probable volume  overload/acute diastolic CHF exacerbation/abnormal 2D echo -Likely secondary to pneumonia/aspiration pneumonia in the setting of possible volume overload. -Had been BiPAP dependent, now on 15 L high flow O2 -Continue to wean O2 as tolerated -BNP obtained at 942.6. -2D echo from 10/23/2020 with a EF of 55 to 57%,XUXY, grade 1 diastolic dysfunction, interventricular septum flattened in diastole consistent with right ventricular volume overload, low normal right ventricular systolic function, right ventricular size normal. -2D echo from 09/05/2021 with EF of 50%, left ventricle with wall motion abnormalities, left ventricular diastolic parameters indeterminate, interventricular septum is flattened in systole consistent with right ventricular pressure overload, mildly reduced right ventricular systolic function, right ventricular size is moderately to severely enlarged, moderately elevated pulmonary artery systolic pressure, right atrial size moderately dilated. -Blood pressure improved post IV albumin however patient remained hypotensive on 09/05/2021 requiring placement on pressors/Levophed.  -Levophed pressors weaned off 09/06/2021 and patient placed on midodrine. -Repeat chest x-ray from 09/04/2021 with increased confluent opacity over the right upper lobe with worsening variation throughout remainder of right lung concerning for worsening pneumonia with a persistent parapneumonic effusion.   -Repeat chest x-ray from 09/05/2021 with multilobular infiltrate and small effusions, most likely pneumonia, mild treatment right upper lobe infiltrate.  -ABG with a pH of 7.394, PCO2 of 44, PO2 of 58.5. -Status post Lasix 20 mg IV x1 on 09/04/2021 with urine output of 800 cc recorded.   -Diuretics now discontinued. -Strict I's and O's.  -Continue scheduled Xopenex and Atrovent nebs, Pulmicort, Brovana, Claritin, PPI, Flonase -- Patient completed 7-day course of Zosyn, abx now d/c'd  4.  Elevated  troponin/abnormal 2D echo -Patient with acute on chronic respiratory distress on 09/04/2021, noted to have elevated BNP with concerns for volume overload.  Cardiac enzymes were elevated. -2D echo with EF of 50%, left ventricle with wall motion abnormalities, left ventricular diastolic parameters indeterminate, interventricular septum is flattened in systole consistent with right ventricular pressure overload, mildly reduced right ventricular systolic function, right ventricular size is moderately to severely enlarged, moderately elevated pulmonary artery systolic pressure, right atrial size moderately dilated. -Patient seen in consultation by cardiology this point in time suspect no need for further evaluation given her comorbid illness and frailty. -Patient noted to be hypotensive was on pressors which have been weaned off.  Hold off on starting beta-blocker.  -Cardiology recommending continuation of empiric therapy with Plavix with no plans for invasive evaluation at this time. -Cardiology has signed off and recommending outpatient follow-up with Dr. Johney Frame in the outpatient setting. -Continued with midodrine as tolerated  5.  Diarrhea>>>.  Constipation -Patient had reported 5 days of diarrhea at home prior to admission. -No bowel movement since admission. -Patient with complaints of constipation. -Continue current bowel regimen as needed  6.  Paroxysmal atrial fibrillation -Amiodarone for rate control.   -Xarelto for anticoagulation.   -Cardiology had been following earlier, since signed off  7.  Prolonged QTC -Patient noted to have a QTC of 560 on admission, chronically on amiodarone. -Repeat EKG with resolution of QT prolongation currently at 437. -Plan to keep potassium approximately at 4, magnesium approximately at 2. -Recheck basic metabolic panel in the morning  8.  Chronic hyponatremia -Sodium overall stable at 127 today, patient is asymptomatic -Has documented hx of chronic  hyponatremia. -Repeat basic metabolic panel in the morning  9.  Chronic venous stasis of bilateral lower  extremities/venous insufficiency with ulceration -Continue current wound treatment recommendations per [place] RN  10.  Hypomagnesemia/hypokalemia -Replaced  -Repeat lytes in AM  11.  Moderate protein calorie malnutrition -Patient cachectic and frail on examination. -Albumin level at 2.3 on admission. -Diet liberalized.  -Status post IV albumin every 6 hours x1 day.   -Albumin currently at 2.1 (09/07/2021).   -Continue Ensure nutritional supplementation.   -RD was consulted.  12.??  Pulmonary hypertension -2D echo concerning for pulmonary hypertension felt likely in the setting of group 3 disease with chronic respiratory failure/COPD per PCCM.  Patient would benefit from a sleep study in the outpatient setting.  13.  Goals of care -It is noted that palliative care was to follow-up with patient after recent discharge, patient is a DO NOT INTUBATE.  Patient does express desire for no CPR as she feels could break her ribs.  Patient is accepting of ACLS medications if needed.  CODE STATUS was later changed to limited code.   -Palliative care has assessed patient and current recommendations are to continue current plan, recommend outpatient palliative referral -Appreciate Palliative Care input   DVT prophylaxis: Xarelto Code Status: Partial Family Communication: Pt in room, family not at bedside  Status is: Inpatient  Remains inpatient appropriate because: severity of illness    Consultants:  Preston, RN 09/02/2021 PCCM: Dr.Olalere 09/04/2021 Cardiology: Dr. Percival Spanish 09/04/2021 Palliative care: Mariana Kaufman, NP 09/08/2021  Procedures:    Antimicrobials: Anti-infectives (From admission, onward)    Start     Dose/Rate Route Frequency Ordered Stop   09/04/21 1200  piperacillin-tazobactam (ZOSYN) IVPB 3.375 g  Status:  Discontinued        3.375 g 100 mL/hr over 30  Minutes Intravenous Every 6 hours 09/04/21 0955 09/04/21 0957   09/04/21 1100  piperacillin-tazobactam (ZOSYN) IVPB 3.375 g        3.375 g 12.5 mL/hr over 240 Minutes Intravenous Every 8 hours 09/04/21 0959 09/11/21 0000   09/02/21 1600  ampicillin-sulbactam (UNASYN) 1.5 g in sodium chloride 0.9 % 100 mL IVPB  Status:  Discontinued        1.5 g 200 mL/hr over 30 Minutes Intravenous Every 6 hours 09/02/21 1006 09/04/21 0955   09/02/21 1000  cefTRIAXone (ROCEPHIN) 2 g in sodium chloride 0.9 % 100 mL IVPB  Status:  Discontinued        2 g 200 mL/hr over 30 Minutes Intravenous Every 24 hours 09/01/21 1551 09/01/21 1819   09/02/21 1000  azithromycin (ZITHROMAX) 500 mg in sodium chloride 0.9 % 250 mL IVPB  Status:  Discontinued        500 mg 250 mL/hr over 60 Minutes Intravenous Every 24 hours 09/01/21 1551 09/01/21 1720   09/01/21 2359  Ampicillin-Sulbactam (UNASYN) 3 g in sodium chloride 0.9 % 100 mL IVPB  Status:  Discontinued        3 g 200 mL/hr over 30 Minutes Intravenous Every 8 hours 09/01/21 1823 09/02/21 1006   09/01/21 1615  cefTRIAXone (ROCEPHIN) 1 g in sodium chloride 0.9 % 100 mL IVPB  Status:  Discontinued        1 g 200 mL/hr over 30 Minutes Intravenous  Once 09/01/21 1602 09/01/21 1720   09/01/21 1445  azithromycin (ZITHROMAX) 500 mg in sodium chloride 0.9 % 250 mL IVPB        500 mg 250 mL/hr over 60 Minutes Intravenous Every 24 hours 09/01/21 1440 09/01/21 1606   09/01/21 1445  cefTRIAXone (ROCEPHIN) 1 g in sodium chloride  0.9 % 100 mL IVPB        1 g 200 mL/hr over 30 Minutes Intravenous Every 24 hours 09/01/21 1440 09/01/21 1532       Subjective: Reports feeling well this AM. States requiring more O2 overnight  Objective: Vitals:   09/14/21 2109 09/15/21 0612 09/15/21 0823 09/15/21 0826  BP: (!) 95/55 (!) 99/53    Pulse: 71 65    Resp: 20 20    Temp: 99.1 F (37.3 C) 98.1 F (36.7 C)    TempSrc: Oral Oral    SpO2: 95% 96% 95% 95%  Weight:      Height:         Intake/Output Summary (Last 24 hours) at 09/15/2021 1827 Last data filed at 09/15/2021 0615 Gross per 24 hour  Intake --  Output 600 ml  Net -600 ml    Filed Weights   09/02/21 1900 09/04/21 1000 09/10/21 1746  Weight: 41.9 kg 44 kg 46.3 kg    Examination: General exam: Conversant, in no acute distress Respiratory system: normal chest rise, clear, no audible wheezing Cardiovascular system: regular rhythm, s1-s2 Gastrointestinal system: Nondistended, nontender, pos BS Central nervous system: No seizures, no tremors Extremities: No cyanosis, no joint deformities Skin: No rashes, no pallor Psychiatry: Affect normal // no auditory hallucinations   Data Reviewed: I have personally reviewed following labs and imaging studies  CBC: Recent Labs  Lab 09/09/21 0239 09/10/21 0243 09/11/21 0238 09/14/21 0501  WBC 16.5* 14.9* 14.9* 11.4*  NEUTROABS  --  11.3*  --   --   HGB 8.6* 8.0* 8.0* 8.0*  HCT 26.9* 25.5* 25.7* 25.9*  MCV 89.7 91.1 91.5 91.5  PLT 603* 580* 641* 785*    Basic Metabolic Panel: Recent Labs  Lab 09/10/21 0243 09/11/21 0238 09/12/21 0448 09/13/21 0550 09/14/21 0501 09/15/21 0451  NA 129* 127* 131* 130* 127* 127*  K 4.5 4.7 4.9 5.1 4.8 4.6  CL 92* 88* 92* 92* 90* 89*  CO2 30 33* 33* 32 31 34*  GLUCOSE 81 90 83 81 105* 87  BUN _0 CREATININE 0.64 0.64 0.64 0.58 0.69 0.69  CALCIUM 8.0* 7.8* 8.2* 8.6* 8.3* 8.5*  MG 1.5*  --  2.0  --   --   --   PHOS 3.6  --   --   --   --   --     GFR: Estimated Creatinine Clearance: 43.7 mL/min (by C-G formula based on SCr of 0.69 mg/dL). Liver Function Tests: Recent Labs  Lab 09/11/21 0238 09/12/21 0448 09/13/21 0550 09/14/21 0501 09/15/21 0451  AST 11* 13* 17 12* 13*  ALT _1 ALKPHOS 105 100 117 105 122  BILITOT 0.4 0.3 0.4 0.2* 0.5  PROT 5.3* 5.6* 6.2* 5.9* 5.8*  ALBUMIN 1.9* 1.9* 2.2* 2.0* 2.0*    No results for input(s): LIPASE, AMYLASE in the last 168 hours. No  results for input(s): AMMONIA in the last 168 hours. Coagulation Profile: No results for input(s): INR, PROTIME in the last 168 hours. Cardiac Enzymes: No results for input(s): CKTOTAL, CKMB, CKMBINDEX, TROPONINI in the last 168 hours. BNP (last 3 results) No results for input(s): PROBNP in the last 8760 hours. HbA1C: No results for input(s): HGBA1C in the last 72 hours. CBG: No results for input(s): GLUCAP in the last 168 hours. Lipid Profile: No results for input(s): CHOL, HDL, LDLCALC, TRIG, CHOLHDL, LDLDIRECT in the last 72 hours. Thyroid Function Tests: No results  for input(s): TSH, T4TOTAL, FREET4, T3FREE, THYROIDAB in the last 72 hours. Anemia Panel: No results for input(s): VITAMINB12, FOLATE, FERRITIN, TIBC, IRON, RETICCTPCT in the last 72 hours. Sepsis Labs: No results for input(s): PROCALCITON, LATICACIDVEN in the last 168 hours.  No results found for this or any previous visit (from the past 240 hour(s)).     Radiology Studies: No results found.  Scheduled Meds:  (feeding supplement) PROSource Plus  30 mL Oral BID BM   amiodarone  200 mg Oral Daily   arformoterol  15 mcg Nebulization BID   bisacodyl  10 mg Rectal Daily   budesonide (PULMICORT) nebulizer solution  0.5 mg Nebulization BID   busPIRone  10 mg Oral Daily   clopidogrel  75 mg Oral Daily   feeding supplement  237 mL Oral BID BM   fluticasone  2 spray Each Nare Daily   guaiFENesin  1,200 mg Oral BID   loratadine  10 mg Oral Daily   mouth rinse  15 mL Mouth Rinse BID   midodrine  5 mg Oral TID WC   multivitamin with minerals  1 tablet Oral Daily   pantoprazole  40 mg Oral Q0600   polyethylene glycol  17 g Oral BID   Rivaroxaban  15 mg Oral Q breakfast   Continuous Infusions:  sodium chloride Stopped (09/08/21 1414)     LOS: 13 days   Marylu Lund, MD [facility] Pager On Amion  If 7PM-7AM, please contact night-coverage 09/15/2021, 6:27 PM

## 2021-09-15 NOTE — NC FL2 (Signed)
[place] LEVEL OF CARE SCREENING TOOL     IDENTIFICATION  Patient Name: Kristen [name] Birthdate: 03-12-1946 Sex: female Admission Date (Current Location): 09/01/2021  [facility] and [place] Number:  Herbalist and Address:  [facility],  [address] [place], [place]      Provider Number: [id number]  Attending Physician Name and Address:  Donne Hazel, MD  Relative Name and Phone Number:  [name] (Sister)   [phone]    Current Level of Care: Hospital Recommended Level of Care: [place] Prior Approval Number:    Date Approved/Denied:   PASRR Number: [id number]  Discharge Plan: SNF    Current Diagnoses: Patient Active Problem List   Diagnosis Date Noted   Elevated troponin    Sepsis ([place]) 09/03/2021   Protein-calorie malnutrition, severe 09/03/2021   Community acquired pneumonia 09/01/2021   Diarrhea 09/01/2021   Chronic respiratory failure with hypoxia ([place]) 09/01/2021   Hypotension    Pressure injury of skin 06/08/2021   CAP (community acquired pneumonia) 05/29/2021   Aspiration pneumonia ([place]) 05/29/2021   Atrial fibrillation with RVR ([place]) 05/27/2021   Cellulitis of left lower extremity 05/06/2021   Aortic atherosclerosis ([place]) 03/07/2021   Opiate use 03/05/2021   Need for assessment by dentistry for poor dentition 03/05/2021   Abdominal pain 02/06/2021   Health care maintenance 12/27/2020   AF (paroxysmal atrial fibrillation) ([place]) 12/16/2020   Nausea 11/07/2020   Lower extremity ulceration ([place]) 10/04/2020   Generalized anxiety disorder 08/27/2020   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[place])    Smoking 07/18/2020   Venous insufficiency of both lower extremities 07/18/2020   Acute respiratory failure with hypoxia ([place]) 07/01/2020   COPD (chronic obstructive pulmonary disease) ([place]) 06/28/2020   Protein-calorie malnutrition ([place]) 06/28/2020   Hyponatremia 06/28/2020    Orientation  RESPIRATION BLADDER Height & Weight     Self, Time, Situation, Place  O2 (4L [place]) External catheter Weight: 46.3 kg Height:  _0  (180.3 cm)  BEHAVIORAL SYMPTOMS/MOOD NEUROLOGICAL BOWEL NUTRITION STATUS      Incontinent Diet (see d/c summary)  AMBULATORY STATUS COMMUNICATION OF NEEDS Skin   Extensive Assist Verbally Other (Comment) (wounds, venous stasis ulcers, L pretibial, L inner ankle)                       Personal Care Assistance Level of Assistance  Bathing, Feeding, Dressing Bathing Assistance: Maximum assistance Feeding assistance: Independent Dressing Assistance: Limited assistance     Functional Limitations Info  Sight, Hearing, Speech Sight Info: Adequate Hearing Info: Adequate Speech Info: Adequate    SPECIAL CARE FACTORS FREQUENCY  PT (By licensed PT), OT (By licensed OT)     PT Frequency: 5X/W OT Frequency: 5X/W            Contractures Contractures Info: Not present    Additional Factors Info  Code Status Code Status Info: Partial             Current Medications (09/15/2021):  This is the current hospital active medication list Current Facility-Administered Medications  Medication Dose Route Frequency Provider Last Rate Last Admin   (feeding supplement) PROSource Plus liquid 30 mL  30 mL Oral BID BM Eugenie Filler, MD   30 mL at 09/15/21 1239   0.9 %  sodium chloride infusion  250 mL Intravenous Continuous Freddi Starr, MD   Stopped at 09/08/21 1414   acetaminophen (TYLENOL) tablet 650 mg  650 mg Oral Q6H  PRN Nicole Kindred A, DO   650 mg at 09/14/21 2210   Or   acetaminophen (TYLENOL) suppository 650 mg  650 mg Rectal Q6H PRN Nicole Kindred A, DO       amiodarone (PACERONE) tablet 200 mg  200 mg Oral Daily Nicole Kindred A, DO   200 mg at 09/15/21 1045   arformoterol (BROVANA) nebulizer solution 15 mcg  15 mcg Nebulization BID Jennelle Human B, NP   15 mcg at 09/15/21 4332   bisacodyl (DULCOLAX) suppository 10 mg  10 mg Rectal  Daily Eugenie Filler, MD   10 mg at 09/13/21 0959   budesonide (PULMICORT) nebulizer solution 0.5 mg  0.5 mg Nebulization BID Jennelle Human B, NP   0.5 mg at 09/15/21 9518   busPIRone (BUSPAR) tablet 10 mg  10 mg Oral Daily Nicole Kindred A, DO   10 mg at 09/15/21 1045   clopidogrel (PLAVIX) tablet 75 mg  75 mg Oral Daily Chandrasekhar, Mahesh A, MD   75 mg at 09/15/21 1045   feeding supplement (ENSURE ENLIVE / ENSURE PLUS) liquid 237 mL  237 mL Oral BID BM Donne Hazel, MD   237 mL at 09/15/21 1239   fluticasone (FLONASE) 50 MCG/ACT nasal spray 2 spray  2 spray Each Nare Daily Eugenie Filler, MD   2 spray at 09/15/21 1046   guaiFENesin (MUCINEX) 12 hr tablet 1,200 mg  1,200 mg Oral BID Eugenie Filler, MD   1,200 mg at 09/15/21 1045   levalbuterol (XOPENEX) nebulizer solution 0.63 mg  0.63 mg Nebulization Q4H PRN Eugenie Filler, MD       liver oil-zinc oxide (DESITIN) 40 % ointment   Topical TID PRN Donne Hazel, MD       loratadine (CLARITIN) tablet 10 mg  10 mg Oral Daily Eugenie Filler, MD   10 mg at 09/15/21 1045   MEDLINE mouth rinse  15 mL Mouth Rinse BID Little Ishikawa, MD   15 mL at 09/15/21 1046   midodrine (PROAMATINE) tablet 5 mg  5 mg Oral TID WC Freddi Starr, MD   5 mg at 09/15/21 1554   mirtazapine (REMERON) tablet 15 mg  15 mg Oral QHS PRN Nicole Kindred A, DO   15 mg at 09/14/21 2204   multivitamin with minerals tablet 1 tablet  1 tablet Oral Daily Nicole Kindred A, DO   1 tablet at 09/15/21 1045   ondansetron (ZOFRAN) tablet 4 mg  4 mg Oral Q6H PRN Nicole Kindred A, DO       Or   ondansetron (ZOFRAN) injection 4 mg  4 mg Intravenous Q6H PRN Nicole Kindred A, DO   4 mg at 09/14/21 2204   oxyCODONE (Oxy IR/ROXICODONE) immediate release tablet 5 mg  5 mg Oral Q4H PRN Eugenie Filler, MD   5 mg at 09/15/21 1554   pantoprazole (PROTONIX) EC tablet 40 mg  40 mg Oral Q0600 Eugenie Filler, MD   40 mg at 09/15/21 0515   polyethylene glycol  (MIRALAX / GLYCOLAX) packet 17 g  17 g Oral BID Eugenie Filler, MD   17 g at 09/13/21 8416   polyvinyl alcohol (LIQUIFILM TEARS) 1.4 % ophthalmic solution 1 drop  1 drop Both Eyes PRN Donne Hazel, MD       Rivaroxaban Alveda Reasons) tablet 15 mg  15 mg Oral Q breakfast Nicole Kindred A, DO   15 mg at 09/15/21 0857   senna-docusate (Senokot-S) tablet  1 tablet  1 tablet Oral QHS PRN Ezekiel Slocumb, DO         Discharge Medications: Please see discharge summary for a list of discharge medications.  Relevant Imaging Results:  Relevant Lab Results:   Additional Information [id number] [place], [place]

## 2021-09-16 DIAGNOSIS — J9601 Acute respiratory failure with hypoxia: Secondary | ICD-10-CM | POA: Diagnosis not present

## 2021-09-16 LAB — COMPREHENSIVE METABOLIC PANEL
ALT: 11 U/L (ref 0–44)
AST: 17 U/L (ref 15–41)
Albumin: 2.2 g/dL — ABNORMAL LOW (ref 3.5–5.0)
Alkaline Phosphatase: 109 U/L (ref 38–126)
Anion gap: 9 (ref 5–15)
BUN: 16 mg/dL (ref 8–23)
CO2: 30 mmol/L (ref 22–32)
Calcium: 8.8 mg/dL — ABNORMAL LOW (ref 8.9–10.3)
Chloride: 90 mmol/L — ABNORMAL LOW (ref 98–111)
Creatinine, Ser: 0.42 mg/dL — ABNORMAL LOW (ref 0.44–1.00)
GFR, Estimated: >60 mL/min (ref >60)
Glucose, Bld: 86 mg/dL (ref 70–99)
Potassium: 4.6 mmol/L (ref 3.5–5.1)
Sodium: 129 mmol/L — ABNORMAL LOW (ref 135–145)
Total Bilirubin: 0.2 mg/dL — ABNORMAL LOW (ref 0.3–1.2)
Total Protein: 6.3 g/dL — ABNORMAL LOW (ref 6.5–8.1)

## 2021-09-16 NOTE — Progress Notes (Addendum)
Physical Therapy Treatment Patient Details Name: Kristen [name] MRN: [id number] DOB: 24-Feb-1946 Today's Date: 09/16/2021   History of Present Illness Pt is 75 yo female who presented to [facility] ED from home by EMS due to ongoing diarrhea x 5 days, productive cough with associated right-sided chest pain and chills, with progressive generalized weakness. Medical history significant of COPD with chronic hypoxic respiratory failure on 3 L/min oxygen at baseline, chronic A-fib, venous insufficiency, hx of transient hypotension, hospital admission in July of this year for aspiration pneumonia and acute on chronic hypoxic respiratory failure.    PT Comments    Pt fatigues easily with therapy, sits EOB for ~10 minutes performing pursed lip breathing and abel to reach in all directions without seated LOB. Pt too fatigued to stand or take steps, requests to return to supine. Pt requires +2 assist to come to sitting EOB and to reposition to comfort in bed. Pt tolerates supine BLE strengthening exercises without complaints. Pt on 4L with SpO2 90-93% and HR 60-80s with dyspnea 3/4. Updated d/c rec to SNF due to fatigue and assistance level still required to mobilize. RN in room at EOS due to pt requesting pain medication. Will continue to progress acute PT as able.    Recommendations for follow up therapy are one component of a multi-disciplinary discharge planning process, led by the attending physician.  Recommendations may be updated based on patient status, additional functional criteria and insurance authorization.  Follow Up Recommendations  Skilled nursing-short term rehab (<3 hours/day)     Assistance Recommended at Discharge    Equipment Recommendations  None recommended by PT    Recommendations for Other Services       Precautions / Restrictions Precautions Precautions: Fall Precaution Comments: monitor O2 and HR Restrictions Weight Bearing Restrictions: No     Mobility  Bed Mobility Overal  bed mobility: Needs Assistance Bed Mobility: Supine to Sit;Sit to Supine Supine to sit: Min assist;+2 for physical assistance Sit to supine: Min assist General bed mobility comments: min A +2 for supine to sit, able to inch BLE over towards EOB assist to upright trunk and pivot hips; min A to return to supine and +2 to scoot up in bed and reposition to comfort    Transfers  General transfer comment: pt declines, too fatigued with sitting EOB ~10 minutes    Ambulation/Gait      Stairs             Wheelchair Mobility    Modified Rankin (Stroke Patients Only)       Balance Overall balance assessment: Needs assistance Sitting-balance support: Bilateral upper extremity supported;Feet supported Sitting balance-Leahy Scale: Good Sitting balance - Comments: dyspnea 3/4 while seated EOB, no LOB, able to reach for items and shift weight without LOB       Cognition Arousal/Alertness: Awake/alert Behavior During Therapy: WFL for tasks assessed/performed Overall Cognitive Status: Within Functional Limits for tasks assessed     Exercises General Exercises - Lower Extremity Hip ABduction/ADduction: Supine;AROM;Strengthening;Both;5 reps Straight Leg Raises: Supine;AROM;Strengthening;Both;5 reps    General Comments General comments (skin integrity, edema, etc.): Pt on 4L O2, SpO2 90-93% and HR 60-80s during session with dyspnea 3/4      Pertinent Vitals/Pain Pain Assessment: Faces Faces Pain Scale: Hurts little more Pain Location: generalized with mobility Pain Descriptors / Indicators: Discomfort Pain Intervention(s): Limited activity within patient's tolerance;Monitored during session;Patient requesting pain meds-RN notified    Home Living  Prior Function            PT Goals (current goals can now be found in the care plan section) Acute Rehab PT Goals Patient Stated Goal: to  breathe better, go home PT Goal Formulation: With  patient Time For Goal Achievement: 09/30/21 Potential to Achieve Goals: Fair    Frequency    Min 3X/week      PT Plan Discharge plan needs to be updated    Co-evaluation              AM-PAC PT "6 Clicks" Mobility   Outcome Measure  Help needed turning from your back to your side while in a flat bed without using bedrails?: A Little Help needed moving from lying on your back to sitting on the side of a flat bed without using bedrails?: A Little Help needed moving to and from a bed to a chair (including a wheelchair)?: A Lot Help needed standing up from a chair using your arms (e.g., wheelchair or bedside chair)?: A Lot Help needed to walk in hospital room?: Total Help needed climbing 3-5 steps with a railing? : Total 6 Click Score: 12    End of Session Equipment Utilized During Treatment: Oxygen Activity Tolerance: Patient limited by fatigue Patient left: in bed;with call bell/phone within reach;with nursing/sitter in room Nurse Communication: Mobility status;Patient requests pain meds PT Visit Diagnosis: Muscle weakness (generalized) (M62.81)     Time: 2229-7989 PT Time Calculation (min) (ACUTE ONLY): 23 min  Charges:  $Therapeutic Exercise: 8-22 mins $Therapeutic Activity: 8-22 mins                      Tori Maedell Hedger PT, DPT 09/16/21, 3:51 PM

## 2021-09-16 NOTE — Progress Notes (Signed)
PROGRESS NOTE    Kristen [name]  WLS:[id number] DOB: 04-Oct-1946 DOA: 09/01/2021 PCP: [name], MD    Brief Narrative:  Kristen [name] is a 75 y.o. female with medical history significant of COPD with chronic hypoxic respiratory failure on 3 L/min oxygen at baseline, chronic A-fib, venous insufficiency, hx of transient hypotension, hospital admission in July of this year for aspiration pneumonia and acute on chronic hypoxic respiratory failure.  She was brought to [facility] ED from home by EMS due to ongoing diarrhea x 5 days, productive cough with associated right-sided chest pain and chills, with progressive generalized weakness.  Denies abdominal pain, nausea, vomiting, dysuria, focal weakness numbness or tingling, headaches or other recent symptoms.  Reports increasing her oxygen briefly to 3.5 L/min mostly due to feeling anxious.  Otherwise has been on her usual 3 L/min. Chest xray showed new extensive consolidation in the lateral right middle and lower lung, consistent with PNA, small right pleural effusion and chronic hyperexpansion compatible with emphysema. Patient is followed by the IM residency service at [facility], but this states she would prefer to be admitted here to [facility] this time. Patient admitted placed empirically on IV antibiotics, respiratory status worsen and IV antibiotics broadened, PCCM consulted and following.  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 -On admission patient noted to have met criteria for sepsis with leukocytosis, tachypnea, pneumonia. -Sputum gram stain and culture with no organisms seen. -Completed 7-day course of Zosyn -Continue Brovana, Pulmicort, scheduled Xopenex and Atrovent nebs, Mucinex, Claritin, Flonase. -Recommend repeat imaging in 6 to 8 weeks for resolution of dense right upper lobe pneumonia per PCCM recommendations. -Therapy recs for HHPT at time of d/c --Overnight reported to have increased resp effort, increased SOB, and O2 requirements. On 4L this AM, cont to wean as tolerated. Goal is 3LNC.  2.  Hypotension -Patient with systolic blood pressures noted to be low in the 80s on 09/03/2021.   -Random cortisol 16.8 on 09/05/2021. -Initially required pressors, now on midodrine per PCCM. -Cont with supportive care.   -PCCM had been following, since signed off  3.  Acute on chronic hypoxic respiratory failure in the setting of COPD likely secondary to aspiration pneumonia and probable volume overload/acute diastolic CHF exacerbation/abnormal 2D echo -Likely secondary to pneumonia/aspiration pneumonia in the setting of possible volume overload. -2D echo from 09/05/2021 with EF of 50%, left ventricle with wall motion abnormalities, left ventricular diastolic parameters indeterminate, interventricular septum is flattened in systole consistent with right ventricular pressure overload, mildly reduced right ventricular systolic function, right ventricular size is moderately to severely enlarged, moderately elevated pulmonary artery systolic pressure, right atrial size moderately dilated. -Continue scheduled Xopenex and Atrovent nebs,  Pulmicort, Brovana, Claritin, PPI,  Flonase -- Patient completed 7-day course of Zosyn, abx now d/c'd  4.  Elevated troponin/abnormal 2D echo -Patient with acute on chronic respiratory distress on 09/04/2021, noted to have elevated BNP with concerns for volume overload.  Cardiac enzymes were elevated. -2D echo with EF of 50%, left ventricle with wall motion abnormalities, left ventricular diastolic parameters indeterminate, interventricular septum is flattened in systole consistent with right ventricular pressure overload, mildly reduced right ventricular systolic function, right ventricular size is moderately to severely enlarged, moderately elevated pulmonary artery systolic pressure, right atrial size moderately dilated. -Cardiology recommending continuation of empiric therapy with Plavix with no plans for invasive evaluation at this time. -Cardiology signed off with recs for f/u with Dr. Johney Frame -Continued with midodrine as tolerated  5.  Diarrhea>>>.  Constipation -Continue current bowel regimen as needed  6.  Paroxysmal atrial fibrillation -Amiodarone for rate control.   -Xarelto for anticoagulation.   -Cardiology had been following earlier, since signed off  7.  Prolonged QTC -Patient noted to have a QTC of 560 on admission, chronically on amiodarone. -Repeat EKG with resolution of QT prolongation currently at 437. -Recheck basic metabolic panel in the morning  8.  Chronic hyponatremia -Sodium overall stable at 129 today, patient is asymptomatic -Has documented hx of chronic hyponatremia. -Repeat basic metabolic panel in the morning  9.  Chronic venous stasis of bilateral lower extremities/venous insufficiency with ulceration -Continue current wound treatment recommendations per [place] RN  10.  Hypomagnesemia/hypokalemia -Replaced  -Repeat lytes in AM  11.  Moderate protein calorie malnutrition -Patient cachectic and frail on examination. -Albumin level at 2.3 on  admission. -Continue Ensure nutritional supplementation.    12.??  Pulmonary hypertension -2D echo concerning for pulmonary hypertension felt likely in the setting of group 3 disease with chronic respiratory failure/COPD per PCCM.  Patient would benefit from a sleep study as outpt  13.  Goals of care -It is noted that palliative care was to follow-up with patient after recent discharge, patient is a DO NOT INTUBATE.  Patient does express desire for no CPR as she feels could break her ribs.  Patient is accepting of ACLS medications if needed.  CODE STATUS was later changed to limited code.   -Palliative care has assessed patient and current recommendations are to continue current plan, recommend outpatient palliative referral -Appreciate Palliative Care input   DVT prophylaxis: Xarelto Code Status: Partial Family Communication: Pt in room, family not at bedside  Status is: Inpatient  Remains inpatient appropriate because: severity of illness    Consultants:  WOC Phineas Douglas, RN 09/02/2021 PCCM: Dr.Olalere 09/04/2021 Cardiology: Dr. Percival Spanish 09/04/2021 Palliative care: Mariana Kaufman, NP 09/08/2021  Procedures:    Antimicrobials: Anti-infectives (From admission, onward)    Start     Dose/Rate Route Frequency Ordered Stop   09/04/21 1200  piperacillin-tazobactam (ZOSYN) IVPB 3.375 g  Status:  Discontinued        3.375 g 100 mL/hr over 30 Minutes Intravenous Every 6 hours 09/04/21 0955 09/04/21 0957   09/04/21 1100  piperacillin-tazobactam (ZOSYN) IVPB 3.375 g        3.375 g 12.5 mL/hr over 240 Minutes Intravenous Every 8 hours 09/04/21 0959 09/11/21 0000   09/02/21 1600  ampicillin-sulbactam (UNASYN) 1.5 g in sodium chloride 0.9 % 100 mL IVPB  Status:  Discontinued        1.5 g 200 mL/hr over 30 Minutes Intravenous Every 6 hours 09/02/21 1006 09/04/21 0955   09/02/21 1000  cefTRIAXone (ROCEPHIN) 2 g in sodium chloride  0.9 % 100 mL IVPB  Status:  Discontinued        2 g 200  mL/hr over 30 Minutes Intravenous Every 24 hours 09/01/21 1551 09/01/21 1819   09/02/21 1000  azithromycin (ZITHROMAX) 500 mg in sodium chloride 0.9 % 250 mL IVPB  Status:  Discontinued        500 mg 250 mL/hr over 60 Minutes Intravenous Every 24 hours 09/01/21 1551 09/01/21 1720   09/01/21 2359  Ampicillin-Sulbactam (UNASYN) 3 g in sodium chloride 0.9 % 100 mL IVPB  Status:  Discontinued        3 g 200 mL/hr over 30 Minutes Intravenous Every 8 hours 09/01/21 1823 09/02/21 1006   09/01/21 1615  cefTRIAXone (ROCEPHIN) 1 g in sodium chloride 0.9 % 100 mL IVPB  Status:  Discontinued        1 g 200 mL/hr over 30 Minutes Intravenous  Once 09/01/21 1602 09/01/21 1720   09/01/21 1445  azithromycin (ZITHROMAX) 500 mg in sodium chloride 0.9 % 250 mL IVPB        500 mg 250 mL/hr over 60 Minutes Intravenous Every 24 hours 09/01/21 1440 09/01/21 1606   09/01/21 1445  cefTRIAXone (ROCEPHIN) 1 g in sodium chloride 0.9 % 100 mL IVPB        1 g 200 mL/hr over 30 Minutes Intravenous Every 24 hours 09/01/21 1440 09/01/21 1532       Subjective: Reports increased sob overnight  Objective: Vitals:   09/15/21 2040 09/16/21 0533 09/16/21 0755 09/16/21 0859  BP: (!) 106/57 104/61 (!) 102/53   Pulse: 65 61 66   Resp: 18 13    Temp: 98 F (36.7 C) 98.3 F (36.8 C) 97.9 F (36.6 C)   TempSrc: Oral Oral Oral   SpO2: 95% 94% 95% 95%  Weight:      Height:        Intake/Output Summary (Last 24 hours) at 09/16/2021 1436 Last data filed at 09/16/2021 0900 Gross per 24 hour  Intake 944 ml  Output 2200 ml  Net -1256 ml    Filed Weights   09/02/21 1900 09/04/21 1000 09/10/21 1746  Weight: 41.9 kg 44 kg 46.3 kg    Examination: General exam: Awake, laying in bed, in nad Respiratory system: Currently normal respiratory effort, no wheezing Cardiovascular system: regular rate, s1, s2 Gastrointestinal system: Soft, nondistended, positive BS Central nervous system: CN2-12 grossly intact, strength  intact Extremities: Perfused, no clubbing Skin: Normal skin turgor, no notable skin lesions seen Psychiatry: Mood normal // no visual hallucinations   Data Reviewed: I have personally reviewed following labs and imaging studies  CBC: Recent Labs  Lab 09/10/21 0243 09/11/21 0238 09/14/21 0501  WBC 14.9* 14.9* 11.4*  NEUTROABS 11.3*  --   --   HGB 8.0* 8.0* 8.0*  HCT 25.5* 25.7* 25.9*  MCV 91.1 91.5 91.5  PLT 580* 641* 785*    Basic Metabolic Panel: Recent Labs  Lab 09/10/21 0243 09/11/21 0238 09/12/21 0448 09/13/21 0550 09/14/21 0501 09/15/21 0451 09/16/21 0523  NA 129*   < > 131* 130* 127* 127* 129*  K 4.5   < > 4.9 5.1 4.8 4.6 4.6  CL 92*   < > 92* 92* 90* 89* 90*  CO2 30   < > 33* 32 31 34* 30  GLUCOSE 81   < > 83 81 105* 87 86  BUN 16   < > _0 CREATININE 0.64   < > 0.64 0.58  0.69 0.69 0.42*  CALCIUM 8.0*   < > 8.2* 8.6* 8.3* 8.5* 8.8*  MG 1.5*  --  2.0  --   --   --   --   PHOS 3.6  --   --   --   --   --   --    < > = values in this interval not displayed.    GFR: Estimated Creatinine Clearance: 43.7 mL/min (A) (by C-G formula based on SCr of 0.42 mg/dL (L)). Liver Function Tests: Recent Labs  Lab 09/12/21 0448 09/13/21 0550 09/14/21 0501 09/15/21 0451 09/16/21 0523  AST 13* 17 12* 13* 17  ALT _0 ALKPHOS 100 117 105 122 109  BILITOT 0.3 0.4 0.2* 0.5 0.2*  PROT 5.6* 6.2* 5.9* 5.8* 6.3*  ALBUMIN 1.9* 2.2* 2.0* 2.0* 2.2*    No results for input(s): LIPASE, AMYLASE in the last 168 hours. No results for input(s): AMMONIA in the last 168 hours. Coagulation Profile: No results for input(s): INR, PROTIME in the last 168 hours. Cardiac Enzymes: No results for input(s): CKTOTAL, CKMB, CKMBINDEX, TROPONINI in the last 168 hours. BNP (last 3 results) No results for input(s): PROBNP in the last 8760 hours. HbA1C: No results for input(s): HGBA1C in the last 72 hours. CBG: No results for input(s): GLUCAP in the last 168  hours. Lipid Profile: No results for input(s): CHOL, HDL, LDLCALC, TRIG, CHOLHDL, LDLDIRECT in the last 72 hours. Thyroid Function Tests: No results for input(s): TSH, T4TOTAL, FREET4, T3FREE, THYROIDAB in the last 72 hours. Anemia Panel: No results for input(s): VITAMINB12, FOLATE, FERRITIN, TIBC, IRON, RETICCTPCT in the last 72 hours. Sepsis Labs: No results for input(s): PROCALCITON, LATICACIDVEN in the last 168 hours.  No results found for this or any previous visit (from the past 240 hour(s)).     Radiology Studies: No results found.  Scheduled Meds:  (feeding supplement) PROSource Plus  30 mL Oral BID BM   amiodarone  200 mg Oral Daily   arformoterol  15 mcg Nebulization BID   bisacodyl  10 mg Rectal Daily   budesonide (PULMICORT) nebulizer solution  0.5 mg Nebulization BID   busPIRone  10 mg Oral Daily   clopidogrel  75 mg Oral Daily   feeding supplement  237 mL Oral BID BM   fluticasone  2 spray Each Nare Daily   guaiFENesin  1,200 mg Oral BID   loratadine  10 mg Oral Daily   mouth rinse  15 mL Mouth Rinse BID   midodrine  5 mg Oral TID WC   multivitamin with minerals  1 tablet Oral Daily   pantoprazole  40 mg Oral Q0600   polyethylene glycol  17 g Oral BID   Rivaroxaban  15 mg Oral Q breakfast   Continuous Infusions:  sodium chloride Stopped (09/08/21 1414)     LOS: 14 days   Marylu Lund, MD [facility] Pager On Amion  If 7PM-7AM, please contact night-coverage 09/16/2021, 2:36 PM

## 2021-09-17 ENCOUNTER — Telehealth: Payer: Self-pay | Admitting: Student

## 2021-09-17 DIAGNOSIS — I480 Paroxysmal atrial fibrillation: Secondary | ICD-10-CM

## 2021-09-17 DIAGNOSIS — A419 Sepsis, unspecified organism: Secondary | ICD-10-CM | POA: Diagnosis not present

## 2021-09-17 DIAGNOSIS — J9601 Acute respiratory failure with hypoxia: Secondary | ICD-10-CM | POA: Diagnosis not present

## 2021-09-17 LAB — COMPREHENSIVE METABOLIC PANEL
ALT: 11 U/L (ref 0–44)
AST: 16 U/L (ref 15–41)
Albumin: 2 g/dL — ABNORMAL LOW (ref 3.5–5.0)
Alkaline Phosphatase: 127 U/L — ABNORMAL HIGH (ref 38–126)
Anion gap: 9 (ref 5–15)
BUN: 17 mg/dL (ref 8–23)
CO2: 29 mmol/L (ref 22–32)
Calcium: 8.6 mg/dL — ABNORMAL LOW (ref 8.9–10.3)
Chloride: 87 mmol/L — ABNORMAL LOW (ref 98–111)
Creatinine, Ser: 0.61 mg/dL (ref 0.44–1.00)
GFR, Estimated: >60 mL/min (ref >60)
Glucose, Bld: 84 mg/dL (ref 70–99)
Potassium: 4.6 mmol/L (ref 3.5–5.1)
Sodium: 125 mmol/L — ABNORMAL LOW (ref 135–145)
Total Bilirubin: 0.3 mg/dL (ref 0.3–1.2)
Total Protein: 6.1 g/dL — ABNORMAL LOW (ref 6.5–8.1)

## 2021-09-17 LAB — SODIUM: Sodium: 129 mmol/L — ABNORMAL LOW (ref 135–145)

## 2021-09-17 LAB — SARS CORONAVIRUS 2 (TAT 6-24 HRS): SARS Coronavirus 2: NEGATIVE

## 2021-09-17 MED ORDER — ENSURE ENLIVE PO LIQD: 237.0000 mL | Freq: Two times a day (BID) | ORAL | Status: AC

## 2021-09-17 MED ORDER — INFLUENZA VAC A&B SA ADJ QUAD 0.5 ML IM PRSY
0.5000 mL | PREFILLED_SYRINGE | Freq: Once | INTRAMUSCULAR | Status: AC
  Administered 2021-09-17: 0.5 mL via INTRAMUSCULAR
  Filled 2021-09-17: qty 0.5

## 2021-09-17 MED ORDER — CLOPIDOGREL BISULFATE 75 MG PO TABS: 75.0000 mg | ORAL_TABLET | Freq: Every day | ORAL | Status: DC

## 2021-09-17 MED ORDER — PANTOPRAZOLE SODIUM 40 MG PO TBEC: 40.0000 mg | DELAYED_RELEASE_TABLET | Freq: Every day | ORAL | Status: DC

## 2021-09-17 MED ORDER — MIDODRINE HCL 5 MG PO TABS: 5.0000 mg | ORAL_TABLET | Freq: Three times a day (TID) | ORAL | Status: AC

## 2021-09-17 MED ORDER — INFLUENZA VAC A&B SA ADJ QUAD 0.5 ML IM PRSY: 0.5000 mL | PREFILLED_SYRINGE | INTRAMUSCULAR | Status: DC

## 2021-09-17 MED ORDER — OXYCODONE-ACETAMINOPHEN 10-325 MG PO TABS: 1.0000 | ORAL_TABLET | Freq: Three times a day (TID) | ORAL | 0 refills | Status: DC | PRN

## 2021-09-17 NOTE — TOC Transition Note (Addendum)
Transition of Care [facility]) - CM/SW Discharge Note   Patient Details  Name: Kristen [name] MRN: [id number] Date of Birth: May 22, 1946  [facility] CM/SW Contact:  Trish Mage, LCSW Phone Number: 09/17/2021, 12:29 PM   Clinical Narrative:   Patient who is stable for d/c will transfer to [facility] today.  PTAR arranged.  Nursing, please call report to [phone], room 204A.  TOC sign off.    Final next level of care: Skilled Nursing Facility Barriers to Discharge: Barriers Resolved   Patient Goals and CMS Choice Patient states their goals for this hospitalization and ongoing recovery are:: to go home CMS Medicare.gov Compare Post Acute Care list provided to:: Patient Choice offered to / list presented to : Patient  Discharge Placement                       Discharge Plan and Services   Discharge Planning Services: CM Consult Post Acute Care Choice: Home Health                               Social Determinants of Health (SDOH) Interventions     Readmission Risk Interventions Readmission Risk Prevention Plan 06/17/2021 06/02/2021 09/29/2020  Transportation Screening Complete Complete Complete  PCP or Specialist Appt within 5-7 Days - - Complete  Home Care Screening - - Complete  Medication Review (RN CM) - - Complete  Medication Review [employer]) - Complete -  PCP or Specialist appointment within 3-5 days of discharge - Complete -  [place] or Home Care Consult - Complete -  SW Recovery Care/Counseling Consult Complete Complete -  Palliative Care Screening Complete Complete -  Skilled Nursing Facility Complete Complete -  Some recent data might be hidden

## 2021-09-17 NOTE — Discharge Summary (Signed)
Physician Discharge Summary  Kristen [name] EXN:[id number] DOB: 1946/10/13 DOA: 09/01/2021  PCP: [name], MD  Admit date: 09/01/2021 Discharge date: 09/17/2021  Admitted From: Home Disposition: SNF  Recommendations for Outpatient Follow-up:  Follow up with PCP in 1 week Please obtain BMP/CBC in one week Please follow up on the following pending results: None  Discharge Condition: Stable CODE STATUS: Partial (NO CPR OR INTUBATION) Diet recommendation: Regular diet   Brief/Interim Summary:  Admission HPI written by Little Ishikawa, MD   HPI: Kristen [name] is a 75 y.o. female with medical history significant of COPD with chronic hypoxic respiratory failure on 3 L/min oxygen at baseline, chronic A-fib, venous insufficiency, hx of transient hypotension, hospital admission in July of this year for aspiration pneumonia and acute on chronic hypoxic respiratory failure.  She was brought to [facility] ED from home by EMS due to ongoing diarrhea x 5 days, productive cough with associated right-sided chest pain and chills, with progressive generalized weakness.  Denies abdominal pain, nausea, vomiting, dysuria, focal weakness numbness or tingling, headaches or other recent symptoms.  Reports increasing her oxygen briefly to 3.5 L/min mostly due to feeling anxious.  Otherwise has been on her usual 3 L/min.  Hospital course:  Sepsis Present on admission. Secondary to aspiration pneumonia. Blood cultures with no growth. Sputum gram stain with no growth. Patient required less than 24 hours of Levophed IV for associated hypotension  Aspiration pneumonia RUL pneumonia. PCCM consulted. Patient was treated with and completed a 7-day course of Zosyn IV.  COPD Continue outpatient regimen.  Acute diastolic heart failure Treated with Lasix IV. Transthoracic Echocardiogram (10/14) significant for normal EF with evidence of moderately-large RV.  Hypotension In setting of sepsis. Required  vasopressor support as mentioned above. Started on midodrine. Stable. Continue midodrine.  Acute on chronic respiratory failure with hypoxia Patient is on 3 L/min of oxygen as an outpatient. Acute hypoxia secondary to above problems.  Demand ischemia Patient evaluated by cardiology. No concern for ACS. Cardiology recommended Plavix on discharge.  Diarrhea Constipation Treated symptomatically so stool softener  Paroxysmal atrial fibrillation Continue amiodarone and Xarelto  Prolonged QTc Present on admission with a QTc of 560 msec. Now improved to 437 msec.  Chronic hyponatremia Stable  Hypomagnesemia Hypokalemia Repleted. Stable.  Moderate malnutrition Protein supplementation.  Possible pulmonary hypertension In setting of underlying lung disease. Transthoracic Echocardiogram with enlarged RV. Recommendation for outpatient sleep study. Patient may benefit from pulmonology follow-up.   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)   Generalized anxiety disorder   Lower extremity ulceration (HCC)   AF (paroxysmal atrial fibrillation) (HCC)   Aspiration pneumonia (HCC)   Hypotension   Community acquired pneumonia   Diarrhea   Chronic respiratory failure with hypoxia (HCC)   Protein-calorie malnutrition, severe   Elevated troponin    Discharge Instructions   Allergies as of 09/17/2021       Reactions   Gabapentin Other (See Comments)   Hallucination/ nightmares        Medication List     TAKE these medications    acetaminophen 325 MG tablet Commonly known as: TYLENOL Take 325 mg by mouth every 6 (six) hours as needed for moderate pain.   albuterol 108 (90  Base) MCG/ACT  inhaler Commonly known as: VENTOLIN HFA INHALE 1-2 PUFFS BY MOUTH EVERY 6 HOURS AS NEEDED FOR WHEEZE OR SHORTNESS OF BREATH What changed:  how much to take how to take this when to take this reasons to take this additional instructions   amiodarone 200 MG tablet Commonly known as: PACERONE TAKE 1 TABLET BY MOUTH EVERY DAY   busPIRone 10 MG tablet Commonly known as: BUSPAR Take 1 tablet (10 mg total) by mouth daily.   clopidogrel 75 MG tablet Commonly known as: PLAVIX Take 1 tablet (75 mg total) by mouth daily. Start taking on: September 18, 2021   docusate sodium 100 MG capsule Commonly known as: COLACE Take 1 capsule (100 mg total) by mouth 2 (two) times daily as needed for mild constipation.   Dulera 200-5 MCG/ACT Aero Generic drug: mometasone-formoterol Inhale 1 puff into the lungs in the morning and at bedtime.   feeding supplement Liqd Take 237 mLs by mouth 2 (two) times daily between meals.   fluticasone 50 MCG/ACT nasal spray Commonly known as: FLONASE Place 1 spray into both nostrils daily as needed for allergies or rhinitis.   Incruse Ellipta 62.5 MCG/ACT Aepb Generic drug: umeclidinium bromide INHALE 1 PUFF BY MOUTH EVERY DAY What changed:  how much to take how to take this when to take this additional instructions   ipratropium-albuterol 0.5-2.5 (3) MG/3ML Soln Commonly known as: DUONEB TAKE 3 MLS BY NEBULIZATION EVERY 6 (SIX) HOURS AS NEEDED (SHORTNESS OF BREATH/WHEEZING.).   Magnesium 250 MG Tabs Take 250 mg by mouth daily.   metoprolol succinate 25 MG 24 hr tablet Commonly known as: TOPROL-XL Take 25 mg by mouth 2 (two) times daily.   midodrine 5 MG tablet Commonly known as: PROAMATINE Take 1 tablet (5 mg total) by mouth 3 (three) times daily with meals.   mirtazapine 15 MG tablet Commonly known as: REMERON Take 1 tablet (15 mg total) by mouth at bedtime as needed (sleep).   multivitamin with minerals Tabs tablet Take 1 tablet by mouth  daily.   ondansetron 4 MG tablet Commonly known as: ZOFRAN TAKE 1 TABLET BY MOUTH DAILY AS NEEDED FOR NAUSEA OR VOMITING. What changed:  how much to take how to take this when to take this reasons to take this additional instructions   oxyCODONE-acetaminophen 10-325 MG tablet Commonly known as: Percocet Take 1 tablet by mouth every 8 (eight) hours as needed for pain.   OXYGEN Inhale 3 L/min into the lungs continuous.   pantoprazole 40 MG tablet Commonly known as: PROTONIX Take 1 tablet (40 mg total) by mouth daily at 6 (six) AM. Start taking on: September 18, 2021   polyethylene glycol 17 g packet Commonly known as: MIRALAX / GLYCOLAX Take 17 g by mouth daily as needed for moderate constipation.   Rivaroxaban 15 MG Tabs tablet Commonly known as: Xarelto Take 1 tablet (15 mg total) by mouth daily.               Discharge Care Instructions  (From admission, onward)           Start     Ordered   09/17/21 0000  Discharge wound care:       Comments: Wound care to bilateral wounds on medial malleolus:  Cleanse with NS, pat dry. Cover with xeroform gauze Kellie Simmering # [id number]), top with dry gauze and secure with Kerlix roll gauze wrapped from just below toes to just below knees. Secure with paper tape.  Change daily.  Float heels   09/17/21 1243            Contact information for after-discharge care     Destination     [facility] .   Service: Skilled Nursing Contact information: [place] [address] [phone]                    Allergies  Allergen Reactions   Gabapentin Other (See Comments)    Hallucination/ nightmares    Consultations: PCCM Cardiology Palliative care medicine   Procedures/Studies: DG CHEST PORT 1 VIEW  Result Date: 09/05/2021 CLINICAL DATA:  Multilobar pneumonia EXAM: PORTABLE CHEST 1 VIEW COMPARISON:  09/04/2021 FINDINGS: The large area of infiltrate in the right upper  lobe slightly improved. Bibasilar infiltrates unchanged. There is underlying COPD. Small bilateral pleural effusions unchanged. IMPRESSION: Multi lobar infiltrate and small effusions, most likely pneumonia. Mild treatment right upper lobe infiltrate Electronically Signed   By: Franchot Gallo M.D.   On: 09/05/2021 11:40   DG CHEST PORT 1 VIEW  Result Date: 09/04/2021 CLINICAL DATA:  Hypoxia EXAM: PORTABLE CHEST 1 VIEW COMPARISON:  Chest radiograph 09/01/2021 FINDINGS: The cardiomediastinal silhouette is grossly stable. Again seen is confluent opacity in the right upper lobe, worsened since 09/01/2021. Aeration throughout the remainder of the right lung has also worsened in the interim. There is a small right pleural effusion. Aeration of the left lung is not significantly changed. There is no left effusion. No pneumothorax is seen. The bones are stable. IMPRESSION: Increased confluent opacity projecting over the right upper lobe with worsened aeration throughout the remainder of the right lung. Findings concerning for worsening pneumonia with a persistent parapneumonic effusion. As before, follow-up radiographs following appropriate treatment are recommended to ensure resolution. Electronically Signed   By: Valetta Mole M.D.   On: 09/04/2021 11:17   DG Chest Port 1 View  Result Date: 09/01/2021 CLINICAL DATA:  Questionable sepsis - evaluate for abnormality EXAM: PORTABLE CHEST 1 VIEW COMPARISON:  05/28/2021. FINDINGS: New extensive consolidation in the lateral right midlung and lung base. Increased conspicuity of right greater than left interstitial opacities. Chronic hyperexpansion, compatible with emphysema. Small right pleural effusion. No visible pneumothorax. Cardiomediastinal silhouette is within normal limits and similar to prior. IMPRESSION: 1. New extensive consolidation in the lateral right midlung and lung base, concerning for pneumonia. Followup PA and lateral chest X-ray is recommended in 3-4  weeks following trial of antibiotic therapy to ensure resolution and exclude underlying malignancy. 2. Increased conspicuity of right greater than left interstitial opacities, which probably represents infection given above findings. Asymmetric edema is a consideration. 3. Small right pleural effusion. 4. Chronic hyperexpansion, compatible with emphysema. Electronically Signed   By: Margaretha Sheffield M.D.   On: 09/01/2021 14:18   ECHOCARDIOGRAM COMPLETE  Result Date: 09/05/2021    ECHOCARDIOGRAM REPORT   Patient Name:   Kristen [name] Date of Exam: 09/05/2021 Medical Rec #:  [id number]    Height:       71.0 in Accession #:    [id number]   Weight:       97.0 lb Date of Birth:  11-Jun-1946    BSA:          1.550 m Patient Age:    89 years     BP:           63/47 mmHg Patient Gender: F            HR:  88 bpm. Exam Location:  Inpatient Procedure: 2D Echo, 3D Echo, Cardiac Doppler, Color Doppler and Intracardiac            Opacification Agent Indications:    R06.02 SOB; I50.40* Unspecified combined systolic (congestive)                 and diastolic (congestive) heart failure  History:        Patient has prior history of Echocardiogram examinations, most                 recent 10/23/2020. Abnormal ECG, COPD, Arrythmias:Atrial                 Fibrillation, Signs/Symptoms:Chest Pain and Hypotension; Risk                 Factors:Current Smoker. Opiate use.  Sonographer:    Roseanna Rainbow RDCS Referring Phys: [place]  1. Left ventricular ejection fraction, by estimation, is 50%. The left ventricle has low normal function. The left ventricle demonstrates regional wall motion abnormalities (see scoring diagram/findings for description). Left ventricular diastolic parameters are indeterminate. There is the interventricular septum is flattened in systole, consistent with right ventricular pressure overload.  2. Right ventricular systolic function is mildly reduced. The right ventricular size is  moderate-severely enlarged. There is moderately elevated pulmonary artery systolic pressure. The estimated right ventricular systolic pressure is 16.1 mmHg.  3. Right atrial size was moderately dilated.  4. The mitral valve is normal in structure. Mild mitral valve regurgitation. No evidence of mitral stenosis.  5. Tricuspid valve regurgitation is mild to moderate.  6. The aortic valve is grossly normal. There is mild calcification of the aortic valve. Aortic valve regurgitation is trivial. No aortic stenosis is present.  7. The inferior vena cava is dilated in size with <50% respiratory variability, suggesting right atrial pressure of 15 mmHg. Conclusion(s)/Recommendation(s): No left ventricular mural or apical thrombus/thrombi. 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 Left ventricular ejection fraction, by estimation, is 50%. The left ventricle has low normal function. The left ventricle demonstrates regional wall motion abnormalities. Definity contrast agent was  given IV to delineate the left ventricular endocardial borders. The left ventricular internal cavity size was normal in size. There is no left ventricular hypertrophy. The interventricular septum is flattened in systole, consistent with right ventricular pressure overload. Left ventricular diastolic parameters are indeterminate.  LV Wall Scoring: The apical lateral segment, apical septal segment, apical anterior segment, and apical inferior segment are hypokinetic. Right Ventricle: The right ventricular size is moderate-severely enlarged. No increase in right ventricular wall thickness. Right ventricular systolic function is mildly reduced. There is moderately elevated pulmonary artery systolic pressure. The tricuspid regurgitant velocity is 3.08 m/s, and with an assumed right atrial pressure of 15 mmHg, the estimated right ventricular systolic pressure is 09.6 mmHg. Left Atrium: Left atrial size was normal in size. Right Atrium:  Right atrial size was moderately dilated. Pericardium: There is no evidence of pericardial effusion. Mitral Valve: The mitral valve is normal in structure. Mild mitral valve regurgitation. No evidence of mitral valve stenosis. Tricuspid Valve: The tricuspid valve is normal in structure. Tricuspid valve regurgitation is mild to moderate. No evidence of tricuspid stenosis. Aortic Valve: The aortic valve is grossly normal. There is mild calcification of the aortic valve. Aortic valve regurgitation is trivial. No aortic stenosis is present. Pulmonic Valve: The pulmonic valve was grossly normal. Pulmonic valve regurgitation is mild to moderate. No  evidence of pulmonic stenosis. Aorta: The aortic root is normal in size and structure. Venous: The inferior vena cava is dilated in size with less than 50% respiratory variability, suggesting right atrial pressure of 15 mmHg. IAS/Shunts: No atrial level shunt detected by color flow Doppler.  LEFT VENTRICLE PLAX 2D LVIDd:         4.30 cm     Diastology LVIDs:         3.55 cm     LV e' medial:    5.93 cm/s LV PW:         0.90 cm     LV E/e' medial:  7.2 LV IVS:        0.80 cm     LV e' lateral:   8.85 cm/s LVOT diam:     2.00 cm     LV E/e' lateral: 4.8 LV SV:         44 LV SV Index:   28 LVOT Area:     3.14 cm  LV Volumes (MOD) LV vol d, MOD A2C: 79.7 ml LV vol d, MOD A4C: 73.9 ml LV vol s, MOD A2C: 39.4 ml LV vol s, MOD A4C: 41.9 ml LV SV MOD A2C:     40.3 ml LV SV MOD A4C:     73.9 ml LV SV MOD BP:      36.8 ml RIGHT VENTRICLE             IVC RV S prime:     10.10 cm/s  IVC diam: 2.70 cm TAPSE (M-mode): 1.7 cm LEFT ATRIUM             Index        RIGHT ATRIUM           Index LA diam:        3.00 cm 1.94 cm/m   RA Area:     22.50 cm LA Vol (A2C):   62.6 ml 40.38 ml/m  RA Volume:   73.00 ml  47.09 ml/m LA Vol (A4C):   34.2 ml 22.06 ml/m LA Biplane Vol: 47.8 ml 30.83 ml/m  AORTIC VALVE             PULMONIC VALVE LVOT Vmax:   93.80 cm/s  PR End Diast Vel: 2.21 msec LVOT  Vmean:  58.300 cm/s LVOT VTI:    0.139 m  AORTA Ao Root diam: 3.40 cm Ao Asc diam:  3.40 cm MITRAL VALVE               TRICUSPID VALVE MV Area (PHT): 4.21 cm    TR Peak grad:   37.9 mmHg MV Decel Time: 180 msec    TR Vmax:        308.00 cm/s MV E velocity: 42.40 cm/s MV A velocity: 69.80 cm/s  SHUNTS MV E/A ratio:  0.61        Systemic VTI:  0.14 m                            Systemic Diam: 2.00 cm Cherlynn Kaiser MD Electronically signed by Cherlynn Kaiser MD Signature Date/Time: 09/05/2021/6:42:03 PM    Final      Subjective: No concerns overnight.  Discharge Exam: Vitals:   09/17/21 0424 09/17/21 0801  BP: (!) 103/58   Pulse: 61   Resp: 18   Temp: 97.7 F (36.5 C)   SpO2: 96% 100%   Vitals:   09/16/21 2020 09/16/21 2025  09/17/21 0424 09/17/21 0801  BP:  (!) 105/55 (!) 103/58   Pulse:  77 61   Resp:  16 18   Temp:  98.6 F (37 C) 97.7 F (36.5 C)   TempSrc:  Oral Oral   SpO2: 92% 90% 96% 100%  Weight:      Height:        General: Pt is alert, awake, not in acute distress Cardiovascular: RRR, S1/S2 +, no rubs, no gallops Respiratory: CTA bilaterally, no wheezing, no rhonchi Abdominal: Soft, NT, ND, bowel sounds + Extremities: no edema, no cyanosis    The results of significant diagnostics from this hospitalization (including imaging, microbiology, ancillary and laboratory) are listed below for reference.     Microbiology: Recent Results (from the past 240 hour(s))  SARS CORONAVIRUS 2 (TAT 6-24 HRS) Nasopharyngeal Nasopharyngeal Swab     Status: None   Collection Time: 09/16/21  3:32 PM   Specimen: Nasopharyngeal Swab  Result Value Ref Range Status   SARS Coronavirus 2 NEGATIVE NEGATIVE Final    Comment: (NOTE) SARS-CoV-2 target nucleic acids are NOT DETECTED.  The SARS-CoV-2 RNA is generally detectable in upper and lower respiratory specimens during the acute phase of infection. Negative results do not preclude SARS-CoV-2 infection, do not rule out co-infections  with other pathogens, and should not be used as the sole basis for treatment or other patient management decisions. Negative results must be combined with clinical observations, patient history, and epidemiological information. The expected result is Negative.  Fact Sheet for Patients: SugarRoll.be  Fact Sheet for Healthcare Providers: https://www.woods-mathews.com/  This test is not yet approved or cleared by the [place] FDA and  has been authorized for detection and/or diagnosis of SARS-CoV-2 by FDA under an Emergency Use Authorization (EUA). This EUA will remain  in effect (meaning this test can be used) for the duration of the COVID-19 declaration under Se ction 564(b)(1) of the Act, 21 U.S.C. section 360bbb-3(b)(1), unless the authorization is terminated or revoked sooner.  Performed at [facility], [place] [address] [place], [place] [address]      Labs: BNP (last 3 results) Recent Labs    03/07/21 1023 05/27/21 1220 09/04/21 0234  BNP 768.0* 526.6* 191.4*   Basic Metabolic Panel: Recent Labs  Lab 09/12/21 0448 09/13/21 0550 09/14/21 0501 09/15/21 0451 09/16/21 0523 09/17/21 0457 09/17/21 1043  NA 131* 130* 127* 127* 129* 125* 129*  K 4.9 5.1 4.8 4.6 4.6 4.6  --   CL 92* 92* 90* 89* 90* 87*  --   CO2 33* 32 31 34* 30 29  --   GLUCOSE 83 81 105* 87 86 84  --   BUN _0 --   CREATININE 0.64 0.58 0.69 0.69 0.42* 0.61  --   CALCIUM 8.2* 8.6* 8.3* 8.5* 8.8* 8.6*  --   MG 2.0  --   --   --   --   --   --    Liver Function Tests: Recent Labs  Lab 09/13/21 0550 09/14/21 0501 09/15/21 0451 09/16/21 0523 09/17/21 0457  AST 17 12* 13* 17 16  ALT _1 ALKPHOS 117 105 122 109 127*  BILITOT 0.4 0.2* 0.5 0.2* 0.3  PROT 6.2* 5.9* 5.8* 6.3* 6.1*  ALBUMIN 2.2* 2.0* 2.0* 2.2* 2.0*   No results for input(s): LIPASE, AMYLASE in the last 168 hours. No results for input(s): AMMONIA in the  last 168 hours. CBC: Recent Labs  Lab 09/11/21 0238 09/14/21 0501  WBC 14.9* 11.4*  HGB 8.0* 8.0*  HCT 25.7* 25.9*  MCV 91.5 91.5  PLT 641* 785*   Cardiac Enzymes: No results for input(s): CKTOTAL, CKMB, CKMBINDEX, TROPONINI in the last 168 hours. BNP: Invalid input(s): POCBNP CBG: No results for input(s): GLUCAP in the last 168 hours. D-Dimer No results for input(s): DDIMER in the last 72 hours. Hgb A1c No results for input(s): HGBA1C in the last 72 hours. Lipid Profile No results for input(s): CHOL, HDL, LDLCALC, TRIG, CHOLHDL, LDLDIRECT in the last 72 hours. Thyroid function studies No results for input(s): TSH, T4TOTAL, T3FREE, THYROIDAB in the last 72 hours.  Invalid input(s): FREET3 Anemia work up No results for input(s): VITAMINB12, FOLATE, FERRITIN, TIBC, IRON, RETICCTPCT in the last 72 hours. Urinalysis    Component Value Date/Time   COLORURINE AMBER (A) 09/01/2021 2000   APPEARANCEUR CLEAR 09/01/2021 2000   LABSPEC 1.025 09/01/2021 2000   PHURINE 5.0 09/01/2021 2000   GLUCOSEU NEGATIVE 09/01/2021 2000   HGBUR NEGATIVE 09/01/2021 2000   BILIRUBINUR NEGATIVE 09/01/2021 [place] NEGATIVE 09/01/2021 2000   PROTEINUR 30 (A) 09/01/2021 2000   NITRITE NEGATIVE 09/01/2021 2000   LEUKOCYTESUR NEGATIVE 09/01/2021 2000   Sepsis Labs Invalid input(s): PROCALCITONIN,  WBC,  LACTICIDVEN Microbiology Recent Results (from the past 240 hour(s))  SARS CORONAVIRUS 2 (TAT 6-24 HRS) Nasopharyngeal Nasopharyngeal Swab     Status: None   Collection Time: 09/16/21  3:32 PM   Specimen: Nasopharyngeal Swab  Result Value Ref Range Status   SARS Coronavirus 2 NEGATIVE NEGATIVE Final    Comment: (NOTE) SARS-CoV-2 target nucleic acids are NOT DETECTED.  The SARS-CoV-2 RNA is generally detectable in upper and lower respiratory specimens during the acute phase of infection. Negative results do not preclude SARS-CoV-2 infection, do not rule out co-infections with other  pathogens, and should not be used as the sole basis for treatment or other patient management decisions. Negative results must be combined with clinical observations, patient history, and epidemiological information. The expected result is Negative.  Fact Sheet for Patients: SugarRoll.be  Fact Sheet for Healthcare Providers: https://www.woods-mathews.com/  This test is not yet approved or cleared by the [place] FDA and  has been authorized for detection and/or diagnosis of SARS-CoV-2 by FDA under an Emergency Use Authorization (EUA). This EUA will remain  in effect (meaning this test can be used) for the duration of the COVID-19 declaration under Se ction 564(b)(1) of the Act, 21 U.S.C. section 360bbb-3(b)(1), unless the authorization is terminated or revoked sooner.  Performed at [facility], [place] [address], [place], [place] [address]      Time coordinating discharge: 35 minutes  SIGNED:   Cordelia Poche, MD [facility] 09/17/2021, 12:19 PM

## 2021-09-23 NOTE — Telephone Encounter (Signed)
Patient's discharge summary states she was sent to [facility] when she left the hospital.  No appointment scheduled at this time.

## 2021-09-27 ENCOUNTER — Other Ambulatory Visit: Payer: Self-pay | Admitting: Internal Medicine

## 2021-09-27 DIAGNOSIS — J449 Chronic obstructive pulmonary disease, unspecified: Secondary | ICD-10-CM

## 2021-09-29 ENCOUNTER — Encounter (HOSPITAL_COMMUNITY): Payer: Self-pay | Admitting: Radiology

## 2021-09-29 NOTE — Telephone Encounter (Signed)
Attempted to contact patient at the request of Dr. Collene Gobble to schedule future appointment.  Patient is currently at [facility], her direct telephone number there is [phone].  She believes she will be discharged from the facility soon and states she will call us back to get an appointment scheduled when she returns home.  Made the patient aware that Dr. Collene Gobble will be available the majority of November.  Forwarding message back to PCP to make him aware.

## 2021-10-01 ENCOUNTER — Inpatient Hospital Stay (HOSPITAL_COMMUNITY): Payer: Medicare Other

## 2021-10-01 ENCOUNTER — Inpatient Hospital Stay (HOSPITAL_COMMUNITY)
Admission: EM | Admit: 2021-10-01 | Discharge: 2021-10-09 | DRG: 193 | Disposition: A | Discharge status: Skilled Nursing Facility | Payer: Medicare Other | Source: Skilled Nursing Facility | Attending: Internal Medicine | Admitting: Internal Medicine

## 2021-10-01 ENCOUNTER — Emergency Department (HOSPITAL_COMMUNITY): Payer: Medicare Other

## 2021-10-01 ENCOUNTER — Other Ambulatory Visit: Payer: Self-pay

## 2021-10-01 DIAGNOSIS — I872 Venous insufficiency (chronic) (peripheral): Secondary | ICD-10-CM | POA: Diagnosis present

## 2021-10-01 DIAGNOSIS — Z66 Do not resuscitate: Secondary | ICD-10-CM | POA: Diagnosis not present

## 2021-10-01 DIAGNOSIS — Y95 Nosocomial condition: Secondary | ICD-10-CM | POA: Diagnosis present

## 2021-10-01 DIAGNOSIS — J90 Pleural effusion, not elsewhere classified: Secondary | ICD-10-CM | POA: Diagnosis not present

## 2021-10-01 DIAGNOSIS — E43 Unspecified severe protein-calorie malnutrition: Secondary | ICD-10-CM | POA: Diagnosis present

## 2021-10-01 DIAGNOSIS — D649 Anemia, unspecified: Secondary | ICD-10-CM | POA: Diagnosis present

## 2021-10-01 DIAGNOSIS — J439 Emphysema, unspecified: Secondary | ICD-10-CM | POA: Diagnosis not present

## 2021-10-01 DIAGNOSIS — Z87891 Personal history of nicotine dependence: Secondary | ICD-10-CM

## 2021-10-01 DIAGNOSIS — F32A Depression, unspecified: Secondary | ICD-10-CM | POA: Diagnosis present

## 2021-10-01 DIAGNOSIS — Z9981 Dependence on supplemental oxygen: Secondary | ICD-10-CM

## 2021-10-01 DIAGNOSIS — J440 Chronic obstructive pulmonary disease with acute lower respiratory infection: Secondary | ICD-10-CM | POA: Diagnosis present

## 2021-10-01 DIAGNOSIS — E871 Hypo-osmolality and hyponatremia: Secondary | ICD-10-CM | POA: Diagnosis present

## 2021-10-01 DIAGNOSIS — R195 Other fecal abnormalities: Secondary | ICD-10-CM | POA: Diagnosis not present

## 2021-10-01 DIAGNOSIS — J9621 Acute and chronic respiratory failure with hypoxia: Secondary | ICD-10-CM | POA: Diagnosis present

## 2021-10-01 DIAGNOSIS — I5082 Biventricular heart failure: Secondary | ICD-10-CM | POA: Diagnosis present

## 2021-10-01 DIAGNOSIS — L97909 Non-pressure chronic ulcer of unspecified part of unspecified lower leg with unspecified severity: Secondary | ICD-10-CM | POA: Diagnosis present

## 2021-10-01 DIAGNOSIS — K5900 Constipation, unspecified: Secondary | ICD-10-CM | POA: Diagnosis not present

## 2021-10-01 DIAGNOSIS — I2510 Atherosclerotic heart disease of native coronary artery without angina pectoris: Secondary | ICD-10-CM | POA: Diagnosis present

## 2021-10-01 DIAGNOSIS — R64 Cachexia: Secondary | ICD-10-CM | POA: Diagnosis present

## 2021-10-01 DIAGNOSIS — Z9889 Other specified postprocedural states: Secondary | ICD-10-CM | POA: Diagnosis not present

## 2021-10-01 DIAGNOSIS — I480 Paroxysmal atrial fibrillation: Secondary | ICD-10-CM | POA: Diagnosis present

## 2021-10-01 DIAGNOSIS — I110 Hypertensive heart disease with heart failure: Secondary | ICD-10-CM | POA: Diagnosis present

## 2021-10-01 DIAGNOSIS — I4819 Other persistent atrial fibrillation: Secondary | ICD-10-CM | POA: Diagnosis present

## 2021-10-01 DIAGNOSIS — R627 Adult failure to thrive: Secondary | ICD-10-CM | POA: Diagnosis not present

## 2021-10-01 DIAGNOSIS — J189 Pneumonia, unspecified organism: Principal | ICD-10-CM

## 2021-10-01 DIAGNOSIS — I959 Hypotension, unspecified: Secondary | ICD-10-CM | POA: Diagnosis not present

## 2021-10-01 DIAGNOSIS — Z515 Encounter for palliative care: Secondary | ICD-10-CM

## 2021-10-01 DIAGNOSIS — E46 Unspecified protein-calorie malnutrition: Secondary | ICD-10-CM | POA: Diagnosis not present

## 2021-10-01 DIAGNOSIS — Z7902 Long term (current) use of antithrombotics/antiplatelets: Secondary | ICD-10-CM

## 2021-10-01 DIAGNOSIS — Z20822 Contact with and (suspected) exposure to covid-19: Secondary | ICD-10-CM | POA: Diagnosis present

## 2021-10-01 DIAGNOSIS — Z833 Family history of diabetes mellitus: Secondary | ICD-10-CM

## 2021-10-01 DIAGNOSIS — K219 Gastro-esophageal reflux disease without esophagitis: Secondary | ICD-10-CM | POA: Diagnosis present

## 2021-10-01 DIAGNOSIS — J449 Chronic obstructive pulmonary disease, unspecified: Secondary | ICD-10-CM | POA: Diagnosis present

## 2021-10-01 DIAGNOSIS — Z681 Body mass index (BMI) 19 or less, adult: Secondary | ICD-10-CM | POA: Diagnosis not present

## 2021-10-01 DIAGNOSIS — L89151 Pressure ulcer of sacral region, stage 1: Secondary | ICD-10-CM | POA: Diagnosis not present

## 2021-10-01 DIAGNOSIS — Z79899 Other long term (current) drug therapy: Secondary | ICD-10-CM

## 2021-10-01 DIAGNOSIS — L89156 Pressure-induced deep tissue damage of sacral region: Secondary | ICD-10-CM | POA: Diagnosis present

## 2021-10-01 DIAGNOSIS — Z8701 Personal history of pneumonia (recurrent): Secondary | ICD-10-CM

## 2021-10-01 DIAGNOSIS — J918 Pleural effusion in other conditions classified elsewhere: Secondary | ICD-10-CM | POA: Diagnosis present

## 2021-10-01 DIAGNOSIS — F411 Generalized anxiety disorder: Secondary | ICD-10-CM | POA: Diagnosis present

## 2021-10-01 DIAGNOSIS — I5042 Chronic combined systolic (congestive) and diastolic (congestive) heart failure: Secondary | ICD-10-CM | POA: Diagnosis present

## 2021-10-01 DIAGNOSIS — Z888 Allergy status to other drugs, medicaments and biological substances status: Secondary | ICD-10-CM | POA: Diagnosis not present

## 2021-10-01 DIAGNOSIS — I509 Heart failure, unspecified: Secondary | ICD-10-CM | POA: Diagnosis not present

## 2021-10-01 DIAGNOSIS — L97929 Non-pressure chronic ulcer of unspecified part of left lower leg with unspecified severity: Secondary | ICD-10-CM | POA: Diagnosis not present

## 2021-10-01 DIAGNOSIS — Z7901 Long term (current) use of anticoagulants: Secondary | ICD-10-CM

## 2021-10-01 DIAGNOSIS — R54 Age-related physical debility: Secondary | ICD-10-CM | POA: Diagnosis present

## 2021-10-01 DIAGNOSIS — L8990 Pressure ulcer of unspecified site, unspecified stage: Secondary | ICD-10-CM | POA: Diagnosis present

## 2021-10-01 DIAGNOSIS — Z7189 Other specified counseling: Secondary | ICD-10-CM | POA: Diagnosis not present

## 2021-10-01 DIAGNOSIS — R531 Weakness: Secondary | ICD-10-CM | POA: Diagnosis not present

## 2021-10-01 LAB — COMPREHENSIVE METABOLIC PANEL
ALT: 8 U/L (ref 0–44)
AST: 11 U/L — ABNORMAL LOW (ref 15–41)
Albumin: 1.6 g/dL — ABNORMAL LOW (ref 3.5–5.0)
Alkaline Phosphatase: 181 U/L — ABNORMAL HIGH (ref 38–126)
Anion gap: 9 (ref 5–15)
BUN: 26 mg/dL — ABNORMAL HIGH (ref 8–23)
CO2: 31 mmol/L (ref 22–32)
Calcium: 8.4 mg/dL — ABNORMAL LOW (ref 8.9–10.3)
Chloride: 93 mmol/L — ABNORMAL LOW (ref 98–111)
Creatinine, Ser: 0.98 mg/dL (ref 0.44–1.00)
GFR, Estimated: 60 mL/min — ABNORMAL LOW (ref >60)
Glucose, Bld: 108 mg/dL — ABNORMAL HIGH (ref 70–99)
Potassium: 4.2 mmol/L (ref 3.5–5.1)
Sodium: 133 mmol/L — ABNORMAL LOW (ref 135–145)
Total Bilirubin: 0.4 mg/dL (ref 0.3–1.2)
Total Protein: 5.5 g/dL — ABNORMAL LOW (ref 6.5–8.1)

## 2021-10-01 LAB — CBC WITH DIFFERENTIAL/PLATELET
Abs Immature Granulocytes: 0 10*3/uL (ref 0.00–0.07)
Basophils Absolute: 0 10*3/uL (ref 0.0–0.1)
Basophils Relative: 0 %
Eosinophils Absolute: 0 10*3/uL (ref 0.0–0.5)
Eosinophils Relative: 0 %
HCT: 24.8 % — ABNORMAL LOW (ref 36.0–46.0)
Hemoglobin: 7.6 g/dL — ABNORMAL LOW (ref 12.0–15.0)
Lymphocytes Relative: 3 %
Lymphs Abs: 0.8 10*3/uL (ref 0.7–4.0)
MCH: 27.1 pg (ref 26.0–34.0)
MCHC: 30.6 g/dL (ref 30.0–36.0)
MCV: 88.6 fL (ref 80.0–100.0)
Monocytes Absolute: 0.6 10*3/uL (ref 0.1–1.0)
Monocytes Relative: 2 %
Neutro Abs: 26.1 10*3/uL — ABNORMAL HIGH (ref 1.7–7.7)
Neutrophils Relative %: 95 %
Platelets: 646 10*3/uL — ABNORMAL HIGH (ref 150–400)
RBC: 2.8 MIL/uL — ABNORMAL LOW (ref 3.87–5.11)
RDW: 15.6 % — ABNORMAL HIGH (ref 11.5–15.5)
WBC: 27.5 10*3/uL — ABNORMAL HIGH (ref 4.0–10.5)
nRBC: 0 % (ref 0.0–0.2)
nRBC: 0 /100 WBC

## 2021-10-01 LAB — APTT: aPTT: 48 seconds — ABNORMAL HIGH (ref 24–36)

## 2021-10-01 LAB — LACTIC ACID, PLASMA
Lactic Acid, Venous: 1.4 mmol/L (ref 0.5–1.9)
Lactic Acid, Venous: 1.2 mmol/L (ref 0.5–1.9)

## 2021-10-01 LAB — RESP PANEL BY RT-PCR (FLU A&B, COVID) ARPGX2
Influenza A by PCR: NEGATIVE
Influenza B by PCR: NEGATIVE
SARS Coronavirus 2 by RT PCR: NEGATIVE

## 2021-10-01 LAB — PROTIME-INR
INR: 2.5 — ABNORMAL HIGH (ref 0.8–1.2)
Prothrombin Time: 26.6 seconds — ABNORMAL HIGH (ref 11.4–15.2)

## 2021-10-01 LAB — MRSA NEXT GEN BY PCR, NASAL: MRSA by PCR Next Gen: NOT DETECTED

## 2021-10-01 MED ORDER — FLUTICASONE PROPIONATE 50 MCG/ACT NA SUSP: 1.0000 | Freq: Every day | NASAL | Status: DC | PRN

## 2021-10-01 MED ORDER — SODIUM CHLORIDE 0.9 % IV SOLN: 250.0000 mL | INTRAVENOUS | Status: DC | PRN

## 2021-10-01 MED ORDER — MIDODRINE HCL 5 MG PO TABS
5.0000 mg | ORAL_TABLET | Freq: Three times a day (TID) | ORAL | Status: DC
  Administered 2021-10-01 – 2021-10-09 (×25): 5 mg via ORAL
  Filled 2021-10-01 (×25): qty 1

## 2021-10-01 MED ORDER — SODIUM CHLORIDE 0.9 % IV SOLN
2.0000 g | Freq: Two times a day (BID) | INTRAVENOUS | Status: DC
  Administered 2021-10-02 – 2021-10-04 (×5): 2 g via INTRAVENOUS
  Filled 2021-10-01 (×5): qty 2

## 2021-10-01 MED ORDER — IPRATROPIUM-ALBUTEROL 0.5-2.5 (3) MG/3ML IN SOLN
3.0000 mL | Freq: Four times a day (QID) | RESPIRATORY_TRACT | Status: DC | PRN
  Administered 2021-10-06 – 2021-10-07 (×2): 3 mL via RESPIRATORY_TRACT
  Filled 2021-10-01 (×2): qty 3

## 2021-10-01 MED ORDER — ENSURE ENLIVE PO LIQD
237.0000 mL | Freq: Two times a day (BID) | ORAL | Status: DC
  Administered 2021-10-02: 237 mL via ORAL

## 2021-10-01 MED ORDER — MOMETASONE FURO-FORMOTEROL FUM 200-5 MCG/ACT IN AERO
1.0000 | INHALATION_SPRAY | Freq: Two times a day (BID) | RESPIRATORY_TRACT | Status: DC
  Administered 2021-10-02 – 2021-10-09 (×16): 1 via RESPIRATORY_TRACT
  Filled 2021-10-01 (×2): qty 8.8

## 2021-10-01 MED ORDER — DOCUSATE SODIUM 100 MG PO CAPS: 100.0000 mg | ORAL_CAPSULE | Freq: Two times a day (BID) | ORAL | Status: DC | PRN

## 2021-10-01 MED ORDER — OXYCODONE-ACETAMINOPHEN 10-325 MG PO TABS: 1.0000 | ORAL_TABLET | Freq: Three times a day (TID) | ORAL | Status: DC | PRN

## 2021-10-01 MED ORDER — LACTATED RINGERS IV SOLN: INTRAVENOUS | Status: AC

## 2021-10-01 MED ORDER — SODIUM CHLORIDE 0.9 % IV SOLN: 2.0000 g | INTRAVENOUS | Status: DC

## 2021-10-01 MED ORDER — CLOPIDOGREL BISULFATE 75 MG PO TABS
75.0000 mg | ORAL_TABLET | Freq: Every day | ORAL | Status: DC
  Administered 2021-10-01 – 2021-10-09 (×9): 75 mg via ORAL
  Filled 2021-10-01 (×9): qty 1

## 2021-10-01 MED ORDER — MAGNESIUM OXIDE -MG SUPPLEMENT 400 (240 MG) MG PO TABS
200.0000 mg | ORAL_TABLET | Freq: Every day | ORAL | Status: DC
  Administered 2021-10-01 – 2021-10-09 (×9): 200 mg via ORAL
  Filled 2021-10-01 (×9): qty 1

## 2021-10-01 MED ORDER — METOPROLOL SUCCINATE ER 25 MG PO TB24
25.0000 mg | ORAL_TABLET | Freq: Two times a day (BID) | ORAL | Status: DC
  Administered 2021-10-02 – 2021-10-03 (×3): 25 mg via ORAL
  Filled 2021-10-01 (×3): qty 1

## 2021-10-01 MED ORDER — ALBUTEROL SULFATE (2.5 MG/3ML) 0.083% IN NEBU
3.0000 mL | INHALATION_SOLUTION | Freq: Four times a day (QID) | RESPIRATORY_TRACT | Status: DC | PRN
  Administered 2021-10-05 – 2021-10-08 (×4): 3 mL via RESPIRATORY_TRACT
  Filled 2021-10-01 (×4): qty 3

## 2021-10-01 MED ORDER — AMIODARONE HCL 200 MG PO TABS
200.0000 mg | ORAL_TABLET | Freq: Every day | ORAL | Status: DC
  Administered 2021-10-01 – 2021-10-09 (×9): 200 mg via ORAL
  Filled 2021-10-01 (×9): qty 1

## 2021-10-01 MED ORDER — OXYCODONE-ACETAMINOPHEN 5-325 MG PO TABS
1.0000 | ORAL_TABLET | Freq: Three times a day (TID) | ORAL | Status: DC | PRN
  Administered 2021-10-02 – 2021-10-04 (×5): 1 via ORAL
  Filled 2021-10-01 (×6): qty 1

## 2021-10-01 MED ORDER — AZITHROMYCIN 250 MG PO TABS: 500.0000 mg | ORAL_TABLET | Freq: Every day | ORAL | Status: DC

## 2021-10-01 MED ORDER — MIRTAZAPINE 15 MG PO TABS
15.0000 mg | ORAL_TABLET | Freq: Every evening | ORAL | Status: DC | PRN
  Administered 2021-10-04 – 2021-10-08 (×2): 15 mg via ORAL
  Filled 2021-10-01 (×2): qty 1

## 2021-10-01 MED ORDER — SODIUM CHLORIDE 0.9% FLUSH: 3.0000 mL | INTRAVENOUS | Status: DC | PRN

## 2021-10-01 MED ORDER — VANCOMYCIN HCL 1000 MG/200ML IV SOLN
1000.0000 mg | Freq: Once | INTRAVENOUS | Status: AC
  Administered 2021-10-01: 1000 mg via INTRAVENOUS
  Filled 2021-10-01 (×2): qty 200

## 2021-10-01 MED ORDER — BUSPIRONE HCL 5 MG PO TABS
10.0000 mg | ORAL_TABLET | Freq: Every day | ORAL | Status: DC
  Administered 2021-10-01 – 2021-10-09 (×9): 10 mg via ORAL
  Filled 2021-10-01 (×4): qty 2
  Filled 2021-10-01: qty 1
  Filled 2021-10-01 (×4): qty 2

## 2021-10-01 MED ORDER — PANTOPRAZOLE SODIUM 40 MG PO TBEC
40.0000 mg | DELAYED_RELEASE_TABLET | Freq: Every day | ORAL | Status: DC
  Administered 2021-10-02 – 2021-10-09 (×8): 40 mg via ORAL
  Filled 2021-10-01 (×9): qty 1

## 2021-10-01 MED ORDER — RIVAROXABAN 15 MG PO TABS
15.0000 mg | ORAL_TABLET | Freq: Every day | ORAL | Status: DC
  Administered 2021-10-01 – 2021-10-02 (×2): 15 mg via ORAL
  Filled 2021-10-01 (×2): qty 1

## 2021-10-01 MED ORDER — VANCOMYCIN HCL 500 MG/100ML IV SOLN: 500.0000 mg | INTRAVENOUS | Status: DC

## 2021-10-01 MED ORDER — OXYCODONE HCL 5 MG PO TABS
5.0000 mg | ORAL_TABLET | Freq: Three times a day (TID) | ORAL | Status: DC | PRN
  Administered 2021-10-01 – 2021-10-04 (×2): 5 mg via ORAL
  Filled 2021-10-01 (×2): qty 1

## 2021-10-01 MED ORDER — UMECLIDINIUM BROMIDE 62.5 MCG/ACT IN AEPB
1.0000 | INHALATION_SPRAY | Freq: Every day | RESPIRATORY_TRACT | Status: DC
  Administered 2021-10-02 – 2021-10-09 (×7): 1 via RESPIRATORY_TRACT
  Filled 2021-10-01 (×2): qty 7

## 2021-10-01 MED ORDER — SODIUM CHLORIDE 0.9 % IV SOLN
2.0000 g | Freq: Once | INTRAVENOUS | Status: AC
  Administered 2021-10-01: 2 g via INTRAVENOUS
  Filled 2021-10-01: qty 2

## 2021-10-01 MED ORDER — ACETAMINOPHEN 325 MG PO TABS
325.0000 mg | ORAL_TABLET | Freq: Four times a day (QID) | ORAL | Status: DC | PRN
  Filled 2021-10-01: qty 1

## 2021-10-01 MED ORDER — SODIUM CHLORIDE 0.9% FLUSH
3.0000 mL | Freq: Two times a day (BID) | INTRAVENOUS | Status: DC
  Administered 2021-10-01 – 2021-10-09 (×12): 3 mL via INTRAVENOUS

## 2021-10-01 MED ORDER — IOHEXOL 300 MG/ML  SOLN
75.0000 mL | Freq: Once | INTRAMUSCULAR | Status: AC | PRN
  Administered 2021-10-01: 75 mL via INTRAVENOUS

## 2021-10-01 MED ORDER — ONDANSETRON HCL 4 MG PO TABS
4.0000 mg | ORAL_TABLET | Freq: Three times a day (TID) | ORAL | Status: DC | PRN
  Administered 2021-10-05 – 2021-10-06 (×3): 4 mg via ORAL
  Filled 2021-10-01 (×3): qty 1

## 2021-10-01 NOTE — ED Provider Notes (Signed)
[facility] EMERGENCY DEPARTMENT Provider Note   CSN: [id number] Arrival date & time: 10/01/21  1301     History Chief Complaint  Patient presents with   Shortness of Breath    Kristen [name] is a 75 y.o. 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 fib, COPD.  She is currently residing at [facility] and rehab after admission for community-acquired pneumonia thought to be due to aspiration in mid October.  Patient was discharged on 26 October.  Patient began feeling really badly again 2 days ago.  She had worsening cough and was evaluated in the skilled nursing facility found to have recurrent pneumonia and started on IM Rocephin yesterday.  Apparently provider evaluated her today and found her to be very lethargic.  Patient also states that she feels "pretty terrible."  And feels much worse than she is felt over the past few days.  She complains of cough.  She is normally on 3 to 3-1/2 L but is currently requiring 4 L.  She denies fevers, chills, body aches.   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 [facility])     Patient Active Problem List   Diagnosis Date Noted   Elevated troponin    Sepsis ([place]) 09/03/2021   Protein-calorie malnutrition, severe 09/03/2021   Community acquired pneumonia 09/01/2021   Diarrhea 09/01/2021   Chronic respiratory failure with hypoxia ([place]) 09/01/2021   Hypotension    Pressure injury of skin 06/08/2021   CAP (community acquired pneumonia) 05/29/2021   Aspiration pneumonia ([place]) 05/29/2021   Atrial fibrillation with RVR ([place]) 05/27/2021   Cellulitis of left lower  extremity 05/06/2021   Aortic atherosclerosis ([place]) 03/07/2021   Opiate use 03/05/2021   Need for assessment by dentistry for poor dentition 03/05/2021   Abdominal pain 02/06/2021   Health care maintenance 12/27/2020   AF (paroxysmal atrial fibrillation) ([place]) 12/16/2020   Nausea 11/07/2020   Lower extremity ulceration ([place]) 10/04/2020   Generalized anxiety disorder 08/27/2020   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     Family History  Problem Relation Age of Onset   Diabetes Mother    Diabetes Mellitus II Mother    Diabetes Father    Diverticulitis Sister     Social History   Tobacco Use   Smoking status: Former    Packs/day: 0.25    Types: Cigarettes    Quit date: 10/16/2019    Years since quitting: 1.9   Smokeless tobacco: Never   Tobacco comments:    2-3 per day   Vaping Use   Vaping Use: Never used  Substance Use Topics   Alcohol use: Yes    Comment: occasionally  Drug use: Never    Home Medications Prior to Admission medications   Medication Sig Start Date End Date Taking? Authorizing Provider  albuterol (VENTOLIN HFA) 108 (90 Base) MCG/ACT inhaler INHALE 1-2 PUFFS BY MOUTH EVERY 6 HOURS AS NEEDED FOR WHEEZE OR SHORTNESS OF BREATH 09/30/21   [name], MD  acetaminophen (TYLENOL) 325 MG tablet Take 325 mg by mouth every 6 (six) hours as needed for moderate pain.    [provider]  amiodarone (PACERONE) 200 MG tablet TAKE 1 TABLET BY MOUTH EVERY DAY Patient taking differently: Take 200 mg by mouth daily. 08/05/21   [name], MD   busPIRone (BUSPAR) 10 MG tablet Take 1 tablet (10 mg total) by mouth daily. 07/03/21   Riesa Pope, MD  clopidogrel (PLAVIX) 75 MG tablet Take 1 tablet (75 mg total) by mouth daily. 09/18/21   Mariel Aloe, MD  docusate sodium (COLACE) 100 MG capsule Take 1 capsule (100 mg total) by mouth 2 (two) times daily as needed for mild constipation. 06/03/21   Maudie Mercury, MD  feeding supplement (ENSURE ENLIVE / ENSURE PLUS) LIQD Take 237 mLs by mouth 2 (two) times daily between meals. 09/17/21   Mariel Aloe, MD  fluticasone (FLONASE) 50 MCG/ACT nasal spray Place 1 spray into both nostrils daily as needed for allergies or rhinitis. 03/31/21   Katsadouros, Vasilios, MD  ipratropium-albuterol (DUONEB) 0.5-2.5 (3) MG/3ML SOLN TAKE 3 MLS BY NEBULIZATION EVERY 6 (SIX) HOURS AS NEEDED (SHORTNESS OF BREATH/WHEEZING.). 07/31/21 10/29/21  [name], MD  Magnesium 250 MG TABS Take 250 mg by mouth daily.    [provider]  metoprolol succinate (TOPROL-XL) 25 MG 24 hr tablet Take 25 mg by mouth 2 (two) times daily. 07/29/21   [provider]  midodrine (PROAMATINE) 5 MG tablet Take 1 tablet (5 mg total) by mouth 3 (three) times daily with meals. 09/17/21   Mariel Aloe, MD  mirtazapine (REMERON) 15 MG tablet Take 1 tablet (15 mg total) by mouth at bedtime as needed (sleep). 03/18/21   Mosetta Anis, MD  mometasone-formoterol (DULERA) 200-5 MCG/ACT AERO Inhale 1 puff into the lungs in the morning and at bedtime. 07/17/21   Marianna Payment, MD  Multiple Vitamin (MULTIVITAMIN WITH MINERALS) TABS tablet Take 1 tablet by mouth daily.    [provider]  ondansetron (ZOFRAN) 4 MG tablet TAKE 1 TABLET BY MOUTH DAILY AS NEEDED FOR NAUSEA OR VOMITING. Patient taking differently: Take 4 mg by mouth every 8 (eight) hours as needed for nausea or vomiting. 08/05/21   [name], MD  oxyCODONE-acetaminophen (PERCOCET) 10-325 MG tablet Take 1 tablet by mouth every 8 (eight) hours as  needed for pain. 09/17/21   Mariel Aloe, MD  OXYGEN Inhale 3 L/min into the lungs continuous.    [provider]  pantoprazole (PROTONIX) 40 MG tablet Take 1 tablet (40 mg total) by mouth daily at 6 (six) AM. 09/18/21   Mariel Aloe, MD  polyethylene glycol (MIRALAX / GLYCOLAX) 17 g packet Take 17 g by mouth daily as needed for moderate constipation. Patient not taking: No sig reported 06/05/21   Maudie Mercury, MD  umeclidinium bromide (INCRUSE ELLIPTA) 62.5 MCG/INH AEPB INHALE 1 PUFF BY MOUTH EVERY DAY Patient taking differently: Inhale 1 puff into the lungs daily. 07/17/21   Marianna Payment, MD  XARELTO 15 MG TABS tablet TAKE 1 TABLET (15 MG TOTAL) BY MOUTH DAILY. 09/19/21   [name], MD    Allergies  Gabapentin  Review of Systems   Review of Systems  Respiratory:  Positive for shortness of breath.   Ten systems reviewed and are negative for acute change, except as noted in the HPI.   Physical Exam Updated Vital Signs Ht _0  (1.803 m)   Wt 44.5 kg   SpO2 98%   BMI 13.67 kg/m   Physical Exam Vitals and nursing note reviewed.  Constitutional:      General: She is not in acute distress.    Appearance: She is well-developed. She is not diaphoretic.  HENT:     Head: Normocephalic and atraumatic.     Right Ear: External ear normal.     Left Ear: External ear normal.     Nose: Nose normal.     Mouth/Throat:     Mouth: Mucous membranes are moist.  Eyes:     General: No scleral icterus.    Conjunctiva/sclera: Conjunctivae normal.  Cardiovascular:     Rate and Rhythm: Normal rate and regular rhythm.     Heart sounds: Normal heart sounds. No murmur heard.   No friction rub. No gallop.  Pulmonary:     Effort: Pulmonary effort is normal. No respiratory distress.     Breath sounds: Normal breath sounds. No wheezing or rhonchi.  Abdominal:     General: Bowel sounds are normal. There is no distension.     Palpations: Abdomen is soft. There is no mass.      Tenderness: There is no abdominal tenderness. There is no guarding.  Musculoskeletal:     Cervical back: Normal range of motion.  Skin:    General: Skin is warm and dry.  Neurological:     Mental Status: She is alert and oriented to person, place, and time.  Psychiatric:        Behavior: Behavior normal.    ED Results / Procedures / Treatments   Labs (all labs ordered are listed, but only abnormal results are displayed) Labs Reviewed - No data to display  EKG None  Radiology No results found.  Procedures Procedures   Medications Ordered in ED Medications - No data to display  ED Course  I have reviewed the triage vital signs and the nursing notes.  Pertinent labs & imaging results that were available during my care of the patient were reviewed by me and considered in my medical decision making (see chart for details).  Clinical Course as of 10/01/21 1423  Wed Oct 01, 2021  1420 CBC WITH DIFFERENTIAL(!) [AH]    Clinical Course User Index [AH] Margarita Mail, PA-C   MDM Rules/Calculators/A&P                         XY:VOPFY, sob TW:KMQKMMN is gathered by patient and emr. Previous records obtained and reviewed. DDX:The patient's complaint of cough involves an extensive number of diagnostic and treatment options, and is a complaint that carries with it a high risk of complications, morbidity, and potential mortality. Given the large differential diagnosis, medical decision making is of high complexity. Differential diagnosis for emergent cause of cough includes but is not limited to upper respiratory infection, lower respiratory infection, allergies, asthma, irritants, foreign body, medications such as ACE inhibitors, reflux, asthma, CHF, lung cancer, interstitial lung disease, psychiatric causes, postnasal drip and postinfectious bronchospasm.  Labs: I ordered reviewed and interpreted labs which include CBC which shows markedly elevated white blood cell count,  hemoglobin of 7.4 consistent with patient's baseline.  BMP  without significant abnormality.  Lactic acid within normal limits. Imaging: I ordered and reviewed images which included chest x-ray. I independently visualized and interpreted all imaging. Significant findings include right upper lobe worsening pneumonia as compared to previous.   HEB:BWNJN rhythm at a rate of 69 with prolonged QT Consults: Dr Linda Hedges MDM: Patient here with recurrent right upper lobe pneumonia last thought to be due to aspiration.  Question if the patient may have underlying malignancy given her longstanding history of smoking.  I have ordered a CT of the chest to rule out malignancy.  Patient being treated with broad-spectrum antibiotics.  She has a slightly increased oxygen demand and will be admitted to the hospital service Patient disposition:The patient appears reasonably stabilized for admission considering the current resources, flow, and capabilities available in the ED at this time, and I doubt any other [facility] requiring further screening and/or treatment in the ED prior to admission.       Final Clinical Impression(s) / ED Diagnoses Final diagnoses:  None    Rx / DC Orders ED Discharge Orders     None        Margarita Mail, PA-C 10/03/21 [place], [place], DO 10/06/21 [phone]

## 2021-10-01 NOTE — ED Triage Notes (Signed)
BIB [facility] after staff at [facility] place called to report pt having increase SHOB. Per EMS, pt dx with Pneumonia a few weeks ago, but pt not improving. Pt on 3 L baseline but placed on 5 L EMS en route to hospital. Pt also hypotensive on scene systolic in the 56'P. Pt A & O x 4.   Hx. COPD

## 2021-10-01 NOTE — Progress Notes (Signed)
Pharmacy Antibiotic Note  Kristen [name] is a 75 y.o. female admitted on 10/01/2021 with pneumonia.  PMH significant for COPD requiring 3L at baseline, now on 4L nasal cannula. Recent admission (discharged on 09/17/2021) for community-acquired pneumonia. Received ceftriaxone IM at [place] home yesterday. Continued to worsen and antibiotic therapy broadened to cefepime 2g q12h and vancomycin. Pharmacy has been consulted for Vancomycin dosing.  Scr 0.98, baseline ~0.6 AUC goal: 400-550   Plan: Vancomycin 1081m load x 1 Vancomycin 5040mq 24h (eAUC 474.7, Scr 0.98) F/u renal function, cultures and MRSA PCR  Height: _0  (180.3 cm) Weight: 44.5 kg (98 lb) IBW/kg (Calculated) : 70.8  Temp (24hrs), Avg:98 F (36.7 C), Min:98 F (36.7 C), Max:98 F (36.7 C)  Recent Labs  Lab 10/01/21 1329  WBC 27.5*    Estimated Creatinine Clearance: 42 mL/min (by C-G formula based on SCr of 0.61 mg/dL).    Allergies  Allergen Reactions   Gabapentin Other (See Comments)    Hallucination/ nightmares    Antimicrobials this admission: Cefepime 11/9 > Vancomycin 11/9 >  Microbiology results: 11/9 BCx: sent 11/9 MRSA PCR: pending 11/9 COVID: negative  Thank you for allowing pharmacy to participate in this patient's care.  ShLevonne SpillerPharmD PGY1 Acute Care Resident  10/01/2021,5:39 PM

## 2021-10-01 NOTE — ED Provider Notes (Signed)
I personally evaluated the patient during the encounter and completed a history, physical, procedures, medical decision making to contribute to the overall care of the patient and decision making for the patient briefly, the patient is a 75 y.o. female here with cough, shortness of breath..  Unremarkable vitals.  On her home 2 L of oxygen.  Started on Rocephin yesterday at her nursing home for pneumonia.  Recent admission for pneumonia.  Infectious work-up initiated but does not appear to have sepsis vital signs.  Chest x-ray does show worsening right upper lobe pneumonia from chest x-ray from last month where she was admitted for IV antibiotics.  It appears that maybe they started antibiotics yesterday at her facility for possibly new infection.  She does have a white count of 27.  We will start her on IV antibiotics as suspect new pneumonia.  Suspect aspiration related. Anticipate medicine admission.   EKG Interpretation None            Lennice Sites, DO 10/01/21 1509

## 2021-10-01 NOTE — H&P (Signed)
History and Physical    Kristen [name] IDP:[id number] DOB: 1946-03-14 DOA: 10/01/2021  PCP: [name], MD (Confirm with patient/family/NH records and if not entered, this has to be entered at [facility] point of entry) Patient coming from:  snf  I have personally briefly reviewed patient's old medical records in [place]  Chief Complaint: Increased SOB, fever, cough  HPI: Kristen [name] is a 75 y.o. female with medical history significant of COPD with chronic hypoxic respiratory failure on 3 L/min oxygen at baseline, chronic A-fib, venous insufficiency, hx of transient hypotension, hospital admission in July of this year for aspiration pneumonia and acute on chronic hypoxic respiratory failure. She was hospitalized in October for RUL pneumonia thought related to aspiration. She was discharged to [facility] after 7 days of IV Zoxyn. Over the past 48 hours she had increased DOE, cough that was productive. Outpatient x-ray revealed RUL infiltrate. She was evidently given IM Rocephin and her chronic oxygen supplementation was incresed but for continued decline she was sent to [facility] for further evaluation. She is cachetic but his is a long standing problem. She has had moderate pain.   ED Course: T 98  104/52  HR 71  RR 17. CXR with increased RUL opacity/whiteout. CT chest with opacification RUL w/ heterogenous mass like consolidation and moderate volumen right pleural effusion. Lab with nl Cmet, Lactic acid 1.4 WBC 27.5 with 95/3/2, Hgb 7.6. Covid NEGATIVE. Vancomycin and Cefipime for HAP started in ED.  Review of Systems: As per HPI otherwise 10 point review of systems negative.    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 She has two daughters. She raised other children, one is a favorite coming to visit her. She sold and rode motorcycles, was a bar tender and work until 4 years ago. She quit smoking after many, many years. She has a sister in [place] with whom she can live.    reports that she quit smoking about 1 years ago. Her smoking use included cigarettes. She smoked an average of .25 packs per day. She has never used smokeless tobacco. She reports current alcohol use. She reports that she does not use drugs.  Allergies  Allergen Reactions   Gabapentin Other (See Comments)    Hallucination/ nightmares    Family History  Problem Relation Age of Onset   Diabetes Mother    Diabetes Mellitus II Mother    Diabetes Father    Diverticulitis Sister      Prior to Admission medications   Medication Sig Start Date End Date Taking? Authorizing Provider  albuterol (VENTOLIN HFA) 108 (90 Base) MCG/ACT inhaler INHALE 1-2 PUFFS BY MOUTH EVERY 6 HOURS AS NEEDED FOR WHEEZE OR SHORTNESS OF BREATH 09/30/21   [name], MD  acetaminophen (TYLENOL) 325 MG tablet Take 325 mg by mouth every 6 (six) hours as needed for moderate pain.    [provider]  amiodarone (PACERONE) 200 MG tablet TAKE 1 TABLET BY MOUTH EVERY DAY Patient taking differently: Take 200 mg  by mouth daily. 08/05/21   [name], MD  busPIRone (BUSPAR) 10 MG tablet Take 1 tablet (10 mg total) by mouth daily. 07/03/21   Riesa Pope, MD  clopidogrel (PLAVIX) 75 MG tablet Take 1 tablet (75 mg total) by mouth daily. 09/18/21   Mariel Aloe, MD  docusate sodium (COLACE) 100 MG capsule Take 1 capsule (100 mg total) by mouth 2 (two) times daily as needed for mild constipation. 06/03/21   Maudie Mercury, MD  feeding supplement (ENSURE ENLIVE / ENSURE PLUS) LIQD Take 237 mLs by  mouth 2 (two) times daily between meals. 09/17/21   Mariel Aloe, MD  fluticasone (FLONASE) 50 MCG/ACT nasal spray Place 1 spray into both nostrils daily as needed for allergies or rhinitis. 03/31/21   Katsadouros, Vasilios, MD  ipratropium-albuterol (DUONEB) 0.5-2.5 (3) MG/3ML SOLN TAKE 3 MLS BY NEBULIZATION EVERY 6 (SIX) HOURS AS NEEDED (SHORTNESS OF BREATH/WHEEZING.). 07/31/21 10/29/21  [name], MD  Magnesium 250 MG TABS Take 250 mg by mouth daily.    [provider]  metoprolol succinate (TOPROL-XL) 25 MG 24 hr tablet Take 25 mg by mouth 2 (two) times daily. 07/29/21   [provider]  midodrine (PROAMATINE) 5 MG tablet Take 1 tablet (5 mg total) by mouth 3 (three) times daily with meals. 09/17/21   Mariel Aloe, MD  mirtazapine (REMERON) 15 MG tablet Take 1 tablet (15 mg total) by mouth at bedtime as needed (sleep). 03/18/21   Mosetta Anis, MD  mometasone-formoterol (DULERA) 200-5 MCG/ACT AERO Inhale 1 puff into the lungs in the morning and at bedtime. 07/17/21   Marianna Payment, MD  Multiple Vitamin (MULTIVITAMIN WITH MINERALS) TABS tablet Take 1 tablet by mouth daily.    [provider]  ondansetron (ZOFRAN) 4 MG tablet TAKE 1 TABLET BY MOUTH DAILY AS NEEDED FOR NAUSEA OR VOMITING. Patient taking differently: Take 4 mg by mouth every 8 (eight) hours as needed for nausea or vomiting. 08/05/21   [name], MD  oxyCODONE-acetaminophen (PERCOCET) 10-325 MG tablet Take 1 tablet by mouth every 8 (eight) hours as needed for pain. 09/17/21   Mariel Aloe, MD  OXYGEN Inhale 3 L/min into the lungs continuous.    [provider]  pantoprazole (PROTONIX) 40 MG tablet Take 1 tablet (40 mg total) by mouth daily at 6 (six) AM. 09/18/21   Mariel Aloe, MD  polyethylene glycol (MIRALAX / GLYCOLAX) 17 g packet Take 17 g by mouth daily as needed for moderate constipation. Patient not taking: No sig reported 06/05/21   Maudie Mercury, MD  umeclidinium bromide  (INCRUSE ELLIPTA) 62.5 MCG/INH AEPB INHALE 1 PUFF BY MOUTH EVERY DAY Patient taking differently: Inhale 1 puff into the lungs daily. 07/17/21   Marianna Payment, MD  XARELTO 15 MG TABS tablet TAKE 1 TABLET (15 MG TOTAL) BY MOUTH DAILY. 09/19/21   [name], MD    Physical Exam: Vitals:   10/01/21 1430 10/01/21 1445 10/01/21 1545 10/01/21 1600  BP: (!) 104/56 (!) 104/52 (!) 98/51 (!) 96/53  Pulse: 70 71 71 70  Resp: 16 17 (!) 26 (!) 21  Temp:      SpO2: 95% 99% 95% 93%  Weight:      Height:         Vitals:   10/01/21 1430 10/01/21 1445 10/01/21 1545 10/01/21 1600  BP: (!) 104/56 (!) 104/52 (!) 98/51 (!) 96/53  Pulse: 70 71 71 70  Resp: 16 17 (!) 26 (!) 21  Temp:  SpO2: 95% 99% 95% 93%  Weight:      Height:       General: cachetic woman with temporal wasting, bony prominence of rib cage. No respiratory distress.  Eyes: PERRL, lids and conjunctivae normal ENMT: Mucous membranes are moist. Posterior pharynx clear of any exudate or lesions. Edentulous.  Neck: normal, supple, no masses, no thyromegaly Respiratory: No increased WOB. Absent breath sounds posterior and anterior right Upper chest, decreased breath sounds mid to low lung fields. No rales, no wheezes.   Cardiovascular: Regular rate and rhythm, no murmurs / rubs / gallops. No extremity edema. 2+ pedal pulses. No carotid bruits.  Abdomen: Scaphoid, no tenderness, no masses palpated. No hepatosplenomegaly. Bowel sounds positive.  Musculoskeletal: no clubbing / cyanosis. No joint deformity upper and lower extremities. Good ROM, no contractures. Decreased muscle tone.  Skin: no rashes, lesions, ulcers. No induration. Wound distal Left LE with dressing in place. Sacral skin breakdown shallow with erythema Neurologic: CN 2-12 grossly intact but HOH.  Strength 3/5 in all 4.  Psychiatric: Normal judgment and insight. Alert and oriented x 3. Normal mood.     Labs on Admission: I have personally reviewed following labs and  imaging studies  CBC: Recent Labs  Lab 10/01/21 1329  WBC 27.5*  NEUTROABS 26.1*  HGB 7.6*  HCT 24.8*  MCV 88.6  PLT 967*   Basic Metabolic Panel: Recent Labs  Lab 10/01/21 1329  NA 133*  K 4.2  CL 93*  CO2 31  GLUCOSE 108*  BUN 26*  CREATININE 0.98  CALCIUM 8.4*   GFR: Estimated Creatinine Clearance: 34.3 mL/min (by C-G formula based on SCr of 0.98 mg/dL). Liver Function Tests: Recent Labs  Lab 10/01/21 1329  AST 11*  ALT 8  ALKPHOS 181*  BILITOT 0.4  PROT 5.5*  ALBUMIN 1.6*   No results for input(s): LIPASE, AMYLASE in the last 168 hours. No results for input(s): AMMONIA in the last 168 hours. Coagulation Profile: Recent Labs  Lab 10/01/21 1329  INR 2.5*   Cardiac Enzymes: No results for input(s): CKTOTAL, CKMB, CKMBINDEX, TROPONINI in the last 168 hours. BNP (last 3 results) No results for input(s): PROBNP in the last 8760 hours. HbA1C: No results for input(s): HGBA1C in the last 72 hours. CBG: No results for input(s): GLUCAP in the last 168 hours. Lipid Profile: No results for input(s): CHOL, HDL, LDLCALC, TRIG, CHOLHDL, LDLDIRECT in the last 72 hours. Thyroid Function Tests: No results for input(s): TSH, T4TOTAL, FREET4, T3FREE, THYROIDAB in the last 72 hours. Anemia Panel: No results for input(s): VITAMINB12, FOLATE, FERRITIN, TIBC, IRON, RETICCTPCT in the last 72 hours. Urine analysis:    Component Value Date/Time   COLORURINE AMBER (A) 09/01/2021 2000   APPEARANCEUR CLEAR 09/01/2021 2000   LABSPEC 1.025 09/01/2021 2000   PHURINE 5.0 09/01/2021 2000   GLUCOSEU NEGATIVE 09/01/2021 2000   HGBUR NEGATIVE 09/01/2021 2000   BILIRUBINUR NEGATIVE 09/01/2021 [place] NEGATIVE 09/01/2021 2000   PROTEINUR 30 (A) 09/01/2021 2000   NITRITE NEGATIVE 09/01/2021 2000   LEUKOCYTESUR NEGATIVE 09/01/2021 2000    Radiological Exams on Admission: CT Chest W Contrast  Result Date: 10/01/2021 CLINICAL DATA:  Pneumonia, effusion or abscess  suspected EXAM: CT CHEST WITH CONTRAST TECHNIQUE: Multidetector CT imaging of the chest was performed during intravenous contrast administration. CONTRAST:  63m OMNIPAQUE IOHEXOL 300 MG/ML  SOLN COMPARISON:  CT chest 02/22/2021 FINDINGS: Cardiovascular: Normal heart size. No significant pericardial effusion. The thoracic aorta is normal in caliber. Mild atherosclerotic  plaque of the thoracic aorta. At least 2 vessel coronary artery calcifications. No central or segmental pulmonary embolus. Mild left to right mediastinal shift. Mediastinum/Nodes: Enlarged precarinal lymph node measuring 1.3 cm. Enlarged subcarinal lymph node measuring 1.2 cm. Enlarged right paratracheal lymph node measuring 1.2 cm. No enlarged left hilar or axillary lymph nodes. Thyroid gland, trachea, and esophagus demonstrate no significant findings. Lungs/Pleura: Heterogeneous right upper lobe masslike consolidation involving the entire right upper lobe. Passive atelectasis of the right middle and lower lobe with partial collapse. Interval development of a moderate volume right pleural effusion. Nonvisualization of the right bronchials likely due to obstruction or collapse. Severe emphysematous changes of the aerated left lung. Trace left pleural effusion. Passive atelectasis of the left lower lobe. Upper Abdomen: No acute abnormality. Musculoskeletal: No chest wall abnormality. No suspicious lytic or blastic osseous lesions. No acute displaced fracture. IMPRESSION: 1. Complete opacification of the right upper lobe with a heterogeneous masslike consolidation. Interval development of a moderate volume right pleural effusion. 2. Mediastinal lymphadenopathy. 3. Trace left pleural effusion. 4. Aortic Atherosclerosis (ICD10-I70.0) and Emphysema (ICD10-J43.9). Electronically Signed   By: Iven Finn M.D.   On: 10/01/2021 16:49   DG Chest Port 1 View  Result Date: 10/01/2021 CLINICAL DATA:  Sepsis. EXAM: PORTABLE CHEST 1 VIEW COMPARISON:   September 05, 2021. FINDINGS: Stable cardiomediastinal silhouette. Significant increased right upper lobe opacity is noted concerning for worsening pneumonia. Left lung is clear. Right basilar atelectasis and effusion may be present. Bony thorax is unremarkable. IMPRESSION: Significantly increased right upper lobe consolidation is noted concerning for worsening pneumonia, although underlying mass cannot be excluded. CT scan is recommended for further evaluation. Electronically Signed   By: Marijo Conception M.D.   On: 10/01/2021 14:06    EKG: Independently reviewed. Sinus rhythm, old anterior injury, QT increased.   Assessment/Plan Active Problems:   HAP (hospital-acquired pneumonia)   Lower extremity ulceration (HCC)   Pressure injury of skin   COPD (chronic obstructive pulmonary disease) (HCC)   Protein-calorie malnutrition (HCC)   Generalized anxiety disorder   AF (paroxysmal atrial fibrillation) (HCC)  HAP - patient with RUL aspiration PNA in October. Now with cough, weakness, increased oxygen need, leukocytosis with left shift and mass like consolidation of entire RUL along with new moderate volume pleural effusion. Consider this HAP as coming from SNF. Plan Inpatient admit  Blood cx x 2, Urine legionella and strep pending  Continue Vancomycin, Cefipime 1g q8  Continue oxygen to keep Sats>88%  IR for thoracentesis: diagnostic and therapeutic  May need pulmonary consult  2. COPD - GOLD 3-4. On home oxygen 24/7. Not smoking Plan Continue home regimen  3. PAF - stable sinus rhythm Plan Continue home meds - ok to hold Xarelto if necessary for thoracentesis  4. Protein-calorie malnutrition - regular diet - patient says she does ok, ensure TID  5. GAD - continue home meds.   6. Skin care- patient with chronic ulcer distal left LE and skin injury sacrum Plan Daily silvadene and dressing to leg wound  Duoderm to sacral skin breakdown.   7. Code status - confirmed limited code status with  patient and her sister.   DVT prophylaxis: on Xarelto  Code Status: limited code: no CPR, no intubation  Family Communication: spoke with sister and great niece. Patient has been loosing weight. The understand that she has a serious pneumonia and there is remote possibility of a lung cancer.   Disposition Plan: TBD Consults called: None  Admission status:  inpatient    Adella Hare MD [facility] Pager [phone]  If 7PM-7AM, please contact night-coverage [email or web address] Password [email or web address]  10/01/2021, 5:40 PM

## 2021-10-02 ENCOUNTER — Inpatient Hospital Stay (HOSPITAL_COMMUNITY): Payer: Medicare Other

## 2021-10-02 ENCOUNTER — Encounter: Payer: Self-pay | Admitting: *Deleted

## 2021-10-02 ENCOUNTER — Encounter: Payer: Self-pay | Admitting: Student

## 2021-10-02 ENCOUNTER — Encounter (HOSPITAL_COMMUNITY): Payer: Self-pay | Admitting: Internal Medicine

## 2021-10-02 LAB — CBC WITH DIFFERENTIAL/PLATELET
Abs Immature Granulocytes: 0 10*3/uL (ref 0.00–0.07)
Basophils Absolute: 0 10*3/uL (ref 0.0–0.1)
Basophils Relative: 0 %
Eosinophils Absolute: 0 10*3/uL (ref 0.0–0.5)
Eosinophils Relative: 0 %
HCT: 24.6 % — ABNORMAL LOW (ref 36.0–46.0)
Hemoglobin: 7.4 g/dL — ABNORMAL LOW (ref 12.0–15.0)
Lymphocytes Relative: 3 %
Lymphs Abs: 0.8 10*3/uL (ref 0.7–4.0)
MCH: 26.4 pg (ref 26.0–34.0)
MCHC: 30.1 g/dL (ref 30.0–36.0)
MCV: 87.9 fL (ref 80.0–100.0)
Monocytes Absolute: 1.4 10*3/uL — ABNORMAL HIGH (ref 0.1–1.0)
Monocytes Relative: 5 %
Neutro Abs: 25.6 10*3/uL — ABNORMAL HIGH (ref 1.7–7.7)
Neutrophils Relative %: 92 %
Platelets: 661 10*3/uL — ABNORMAL HIGH (ref 150–400)
RBC: 2.8 MIL/uL — ABNORMAL LOW (ref 3.87–5.11)
RDW: 15.5 % (ref 11.5–15.5)
WBC: 27.8 10*3/uL — ABNORMAL HIGH (ref 4.0–10.5)
nRBC: 0 % (ref 0.0–0.2)
nRBC: 0 /100 WBC

## 2021-10-02 LAB — BASIC METABOLIC PANEL
Anion gap: 7 (ref 5–15)
BUN: 25 mg/dL — ABNORMAL HIGH (ref 8–23)
CO2: 29 mmol/L (ref 22–32)
Calcium: 8.6 mg/dL — ABNORMAL LOW (ref 8.9–10.3)
Chloride: 97 mmol/L — ABNORMAL LOW (ref 98–111)
Creatinine, Ser: 0.96 mg/dL (ref 0.44–1.00)
GFR, Estimated: >60 mL/min (ref >60)
Glucose, Bld: 95 mg/dL (ref 70–99)
Potassium: 4.5 mmol/L (ref 3.5–5.1)
Sodium: 133 mmol/L — ABNORMAL LOW (ref 135–145)

## 2021-10-02 LAB — LACTATE DEHYDROGENASE: LDH: 86 U/L — ABNORMAL LOW (ref 98–192)

## 2021-10-02 MED ORDER — LIDOCAINE HCL 1 % IJ SOLN
INTRAMUSCULAR | Status: AC
  Filled 2021-10-02: qty 20

## 2021-10-02 MED ORDER — LACTATED RINGERS IV SOLN: INTRAVENOUS | Status: AC

## 2021-10-02 MED ORDER — ENSURE ENLIVE PO LIQD
237.0000 mL | Freq: Three times a day (TID) | ORAL | Status: DC
  Administered 2021-10-02 – 2021-10-09 (×17): 237 mL via ORAL

## 2021-10-02 MED ORDER — ADULT MULTIVITAMIN W/MINERALS CH
1.0000 | ORAL_TABLET | Freq: Every day | ORAL | Status: DC
  Administered 2021-10-02 – 2021-10-09 (×8): 1 via ORAL
  Filled 2021-10-02 (×7): qty 1

## 2021-10-02 NOTE — Progress Notes (Signed)
RT note- Patient with productive thick green/tan secretions.

## 2021-10-02 NOTE — Progress Notes (Signed)
Things That May Be Affecting Your Health:  Alcohol  Hearing loss X Pain    Depression X Home Safety  Sexual Health   Diabetes  Lack of physical activity  Stress  X Difficulty with daily activities  Loneliness  Tiredness   Drug use  Medicines X Tobacco use   Falls  Motor Vehicle Safety  Weight   Food choices  Oral Health  Other    YOUR PERSONALIZED HEALTH PLAN : 1. Schedule your next subsequent Medicare Wellness visit in one year 2. Attend all of your regular appointments to address your medical issues 3. Complete the preventative screenings and services   Annual Wellness Visit   Medicare Covered Preventative Screenings and [place] Men and Women Who How Often Need? Date of Last Service Action  Abdominal Aortic Aneurysm Adults with AAA risk factors Once      Alcohol Misuse and Counseling All Adults Screening once a year if no alcohol misuse. Counseling up to 4 face to face sessions.     Bone Density Measurement  Adults at risk for osteoporosis Once every 2 yrs X     Lipid Panel Z13.6 All adults without CV disease Once every 5 yrs       Colorectal Cancer  Stool sample or Colonoscopy All adults 106 and older  Once every year Every 10 years        Depression All Adults Once a year  Today   Diabetes Screening Blood glucose, post glucose load, or GTT Z13.1 All adults at risk Pre-diabetics Once per year Twice per year      Diabetes  Self-Management Training All adults Diabetics 10 hrs first year; 2 hours subsequent years. Requires Copay     Glaucoma Diabetics Family history of glaucoma African Americans 21 yrs + Hispanic Americans 77 yrs + Annually - requires coppay      Hepatitis C Z72.89 or F19.20 High Risk for HCV Born between 1945 and 1965 Annually Once      HIV Z11.4 All adults based on risk Annually btw ages 64 & 61 regardless of risk Annually > 65 yrs if at increased risk      Lung Cancer Screening Asymptomatic adults aged 60-77 with 30  pack yr history and current smoker OR quit within the last 15 yrs Annually Must have counseling and shared decision making documentation before first screen      Medical Nutrition Therapy Adults with  Diabetes Renal disease Kidney transplant within past 3 yrs 3 hours first year; 2 hours subsequent years     Obesity and Counseling All adults Screening once a year Counseling if BMI 30 or higher  Today   Tobacco Use Counseling Adults who use tobacco  Up to 8 visits in one year     Vaccines Z23 Hepatitis B Influenza  Pneumonia  Adults  Once Once every flu season Two different vaccines separated by one year X    Next Annual Wellness Visit People with Medicare Every year  Today     Services & Screenings Women Who How Often Need  Date of Last Service Action  Mammogram  Z12.31 Women over 30 One baseline ages 57-39. Annually ager 40 yrs+      Pap tests All women Annually if high risk. Every 2 yrs for normal risk women      Screening for cervical cancer with  Pap (Z01.419 nl or Z01.411abnl) & HPV Z11.51 Women aged 66 to 78 Once every 5 yrs  Screening pelvic and breast exams All women Annually if high risk. Every 2 yrs for normal risk women     Sexually Transmitted Diseases Chlamydia Gonorrhea Syphilis All at risk adults Annually for non pregnant females at increased risk         [place] Men Who How Ofter Need  Date of Last Service Action  Prostate Cancer - DRE & PSA Men over 50 Annually.  DRE might require a copay.        Sexually Transmitted Diseases Syphilis All at risk adults Annually for men at increased risk      Health Maintenance List Health Maintenance  Topic Date Due   Pneumonia Vaccine 5+ Years old (1 - PCV) Never done   Zoster Vaccines- Shingrix (1 of 2) Never done   DEXA SCAN  Never done   COVID-19 Vaccine (3 - Booster for Moderna series) 04/21/2021   TETANUS/TDAP  04/24/2027   INFLUENZA VACCINE  Completed   Hepatitis C Screening   Completed   HPV VACCINES  Aged Out

## 2021-10-02 NOTE — Progress Notes (Addendum)
Paged by [facility] for transfer of this patient to our service, as she is a well known patient of our clinic (PCP- Dr. Collene Gobble). In summary, this patient is a 75 yo female with severe protein calorie malnutrition (BMI 13.6), COPD on 3 L [place], Afib, and venous insufficiency. She was hospitalized in October for RUL pneumonia and was discharged to SNF after receiving IV abx therapy. She presented back to the hospital for worsening SOB, DOE, and productive cough and was admitted for hospital acquired pneumonia on 11/9. Noted to have mass like consolidation of RUL, along with new pleural effusion.  Was started on Vanc/cefepime and IR was consulted for a diagnostic/therapeutic thoracentesis.   Today, the patient was continued on cefepime today for HAP and vanc was discontinued. Evaluated patient this evening, and she is still feeling poorly. She continues to have shortness of breath, but she is back on her baseline of 3 L . Her cough remains productive, as well, and generally the patient just feels ill. She describes feeling "winded".   [facility] will assume care of this patient at 7 am on 11/11.     Buddy Duty, D.O. Internal Medicine Resident, PGY-1 Pager: [phone]

## 2021-10-02 NOTE — Consult Note (Signed)
[facility] Nurse Consult Note: Reason for Consult:Patient admitted with shortness of breath and pneumonia.  She is on antibiotics. 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  Was using silver collagen.  Right lateral lesion is resolved, fully epithelialized and left lateral is early/partially granulated Coccyx with nonblanchable erythema and intact maroon discoloration, consistent with deep tissue injury.  Directly over coccyx is nonintact with peeling epithelium and fibrin to wound bed.  SNF has been treating daily, but she is unsure what they are applying.  She understands to turn and reposition every two hours.   Wound type:chronic venous Pressure Injury POA: Yes Measurement: Coccyx:  1 cm x 1.5 cm x 0.2 cm with fibrin to wound bed. Surrounding sacral tissue is maroon, intact discoloration.  She states the injury has been here for a couple of weeks.  Right great toe with 0.2 scabbed lesion Wound [id number] to coccyx   Drainage (amount, consistency, odor) minimal serosanguinous  no odor.  Periwound: dry skin to feet/legs DTI to sacral area Dressing procedure/placement/frequency:  Cleanse wounds to bilateral lateral malleolus wounds with NS and pat dry. Apply Silicone foam dressing.  Change every three days.  Cleanse sacral wound with NS and pat dry. Apply sacral foam dressing.  Change every three days and PRN soilage Turn and reposition every two hours to offload pressure.  Will implement low air loss mattress.  Will not follow at this time.  Please re-consult if needed.  Domenic Moras MSN, RN, FNP-BC CWON Wound, Ostomy, Continence Nurse Pager [phone]

## 2021-10-02 NOTE — Progress Notes (Unsigned)

## 2021-10-02 NOTE — Progress Notes (Signed)
New Admission Note:    Arrival Method: ED stretcher Mental Orientation:   A&O X4 Telemetry: 49M 18 Assessment: See flowsheet IV:  LUE and RFA  Pain:   0/10 Tubes: NA Safety Measures: Safety Fall Prevention Plan has been given, discussed and signed. Admission: Completed [facility] [place] Orientation: Patient has been orientated to the room, unit and staff.  Family: At bedside      Orders have been reviewed and implemented. Will continue to monitor the patient. Call light has been placed within reach and bed alarm has been activated.   Dorma Russell, RN

## 2021-10-02 NOTE — Progress Notes (Signed)
PROGRESS NOTE   Kristen [name]  ZTI:[id number] DOB: [birth year] DOA: 10/01/2021 PCP: [name], MD  Brief Narrative:  75 year old [place] resident Smoker till 1 year ago, COPD chronic respiratory failure Gold stage C on 3 L oxygen Chronic A. Fib CHADS2 score >3 Venous insufficiency Hospitalization 05/2021 aspiration pneumonia Rehospitalized 10/10 through 10/26 with recurrent aspiration--gram stain did not grow anything at the time blood culture showed no growth and patient completed in-hospital 7-day course of IV Zosyn She started feeling poorly again on 11/7 and was given IM Rocephin after being found to have recurrent pneumonia on  x-ray found to have right upper lobe infiltrate--WBC 27 lactic acid 1.4 COVID-negative  CT chest showed opacification right upper lobe with heterogeneous masslike consolidation Moderate volume pleural effusion right side   Hospital-Problem based course  Likely parapneumonic effusion secondary to aspiration pneumonia recurrent/persistent MRSA in nares is negative, continue at this time cefepime Add Flagyl as indicated Cut back fluids to 75 cc/H LR Patient to go for thoracentesis later today--- follow cytology cultures etc.--LDH ordered for serum drug Chronic atrial fibrillation CHADS2 score >3 Continue amiodarone 200 daily, Toprol-XL 25 twice daily If hypotensive would need to increase amnio to control heart rate Continue Xarelto 15 daily Indication for Plavix is unclear COPD on 3 L oxygen prior smoker Continue oxygen--- no wheeze does not need steroids currently Albuterol 1 to 2 puffs every 6 as needed, DuoNeb every 6 as needed, Dulera 1 puff twice daily, Incruse Ellipta once daily Depression Continue BuSpar 10 daily, Remeron 15 at bedtime sleep  DVT prophylaxis: On Xarelto Code Status: full Family Communication: none present Disposition:  Status is: Inpatient  Remains inpatient appropriate because: patient sick still--requires  care       Consultants:    Procedures:   Antimicrobials: Cefepime    Subjective:  feels better little winded Has some sputum  coherent x4  Objective: Vitals:   10/02/21 0000 10/02/21 0400 10/02/21 0827 10/02/21 0910  BP: (!) 110/55 100/61  104/62  Pulse: 74 66  68  Resp: _0 Temp: 98.7 F (37.1 C)   98.2 F (36.8 C)  TempSrc: Oral   Oral  SpO2: 97% 94% 95% 94%  Weight:      Height:        Intake/Output Summary (Last 24 hours) at 10/02/2021 1644 Last data filed at 10/02/2021 1226 Gross per 24 hour  Intake 3293.53 ml  Output 425 ml  Net 2868.53 ml   Filed Weights   10/01/21 1313  Weight: 44.5 kg    Examination:  EOMI NCAT cachectic white female no distress Dull percussion note and very poor air entry right posterolateral lung fields compared to left Neck soft supple No icterus no pallor S1-S2 no murmur Abdomen soft No lower extremity edema  Data Reviewed: personally reviewed   CBC    Component Value Date/Time   WBC 27.8 (H) 10/02/2021 0045   RBC 2.80 (L) 10/02/2021 0045   HGB 7.4 (L) 10/02/2021 0045   HGB 10.1 (L) 03/04/2021 1453   HCT 24.6 (L) 10/02/2021 0045   HCT 30.7 (L) 03/04/2021 1453   PLT 661 (H) 10/02/2021 0045   PLT 332 03/04/2021 1453   MCV 87.9 10/02/2021 0045   MCV 96 03/04/2021 1453   MCH 26.4 10/02/2021 0045   MCHC 30.1 10/02/2021 0045   RDW 15.5 10/02/2021 0045   RDW 11.6 (L) 03/04/2021 1453   LYMPHSABS 0.8 10/02/2021 0045   LYMPHSABS 2.3 12/26/2020 1441   MONOABS  1.4 (H) 10/02/2021 0045   EOSABS 0.0 10/02/2021 0045   EOSABS 0.7 (H) 12/26/2020 1441   BASOSABS 0.0 10/02/2021 0045   BASOSABS 0.1 12/26/2020 1441   CMP Latest Ref Rng & Units 10/02/2021 10/01/2021 09/17/2021  Glucose 70 - 99 mg/dL 95 108(H) -  BUN 8 - 23 mg/dL 25(H) 26(H) -  Creatinine 0.44 - 1.00 mg/dL 0.96 0.98 -  Sodium 135 - 145 mmol/L 133(L) 133(L) 129(L)  Potassium 3.5 - 5.1 mmol/L 4.5 4.2 -  Chloride 98 - 111 mmol/L 97(L) 93(L) -  CO2 22 -  32 mmol/L 29 31 -  Calcium 8.9 - 10.3 mg/dL 8.6(L) 8.4(L) -  Total Protein 6.5 - 8.1 g/dL - 5.5(L) -  Total Bilirubin 0.3 - 1.2 mg/dL - 0.4 -  Alkaline Phos 38 - 126 U/L - 181(H) -  AST 15 - 41 U/L - 11(L) -  ALT 0 - 44 U/L - 8 -     Radiology Studies: CT Chest W Contrast  Result Date: 10/01/2021 CLINICAL DATA:  Pneumonia, effusion or abscess suspected EXAM: CT CHEST WITH CONTRAST TECHNIQUE: Multidetector CT imaging of the chest was performed during intravenous contrast administration. CONTRAST:  62m OMNIPAQUE IOHEXOL 300 MG/ML  SOLN COMPARISON:  CT chest 02/22/2021 FINDINGS: Cardiovascular: Normal heart size. No significant pericardial effusion. The thoracic aorta is normal in caliber. Mild atherosclerotic plaque of the thoracic aorta. At least 2 vessel coronary artery calcifications. No central or segmental pulmonary embolus. Mild left to right mediastinal shift. Mediastinum/Nodes: Enlarged precarinal lymph node measuring 1.3 cm. Enlarged subcarinal lymph node measuring 1.2 cm. Enlarged right paratracheal lymph node measuring 1.2 cm. No enlarged left hilar or axillary lymph nodes. Thyroid gland, trachea, and esophagus demonstrate no significant findings. Lungs/Pleura: Heterogeneous right upper lobe masslike consolidation involving the entire right upper lobe. Passive atelectasis of the right middle and lower lobe with partial collapse. Interval development of a moderate volume right pleural effusion. Nonvisualization of the right bronchials likely due to obstruction or collapse. Severe emphysematous changes of the aerated left lung. Trace left pleural effusion. Passive atelectasis of the left lower lobe. Upper Abdomen: No acute abnormality. Musculoskeletal: No chest wall abnormality. No suspicious lytic or blastic osseous lesions. No acute displaced fracture. IMPRESSION: 1. Complete opacification of the right upper lobe with a heterogeneous masslike consolidation. Interval development of a moderate  volume right pleural effusion. 2. Mediastinal lymphadenopathy. 3. Trace left pleural effusion. 4. Aortic Atherosclerosis (ICD10-I70.0) and Emphysema (ICD10-J43.9). Electronically Signed   By: MIven FinnM.D.   On: 10/01/2021 16:49   DG Chest Port 1 View  Result Date: 10/01/2021 CLINICAL DATA:  Sepsis. EXAM: PORTABLE CHEST 1 VIEW COMPARISON:  September 05, 2021. FINDINGS: Stable cardiomediastinal silhouette. Significant increased right upper lobe opacity is noted concerning for worsening pneumonia. Left lung is clear. Right basilar atelectasis and effusion may be present. Bony thorax is unremarkable. IMPRESSION: Significantly increased right upper lobe consolidation is noted concerning for worsening pneumonia, although underlying mass cannot be excluded. CT scan is recommended for further evaluation. Electronically Signed   By: JMarijo ConceptionM.D.   On: 10/01/2021 14:06     Scheduled Meds:  amiodarone  200 mg Oral Daily   busPIRone  10 mg Oral Daily   clopidogrel  75 mg Oral Daily   feeding supplement  237 mL Oral TID BM   magnesium oxide  200 mg Oral Daily   metoprolol succinate  25 mg Oral BID   midodrine  5 mg Oral TID WC  mometasone-formoterol  1 puff Inhalation BID   multivitamin with minerals  1 tablet Oral Daily   pantoprazole  40 mg Oral Q0600   Rivaroxaban  15 mg Oral Daily   sodium chloride flush  3 mL Intravenous Q12H   umeclidinium bromide  1 puff Inhalation Daily   Continuous Infusions:  sodium chloride     ceFEPime (MAXIPIME) IV 2 g (10/02/21 1625)     LOS: 1 day   Time spent: [place], MD [facility] To contact the attending provider between 7A-7P or the covering provider during after hours 7P-7A, please log into the web site [email or web address] and access using universal [place] password for that web site. If you do not have the password, please call the hospital operator.  10/02/2021, 4:44 PM

## 2021-10-02 NOTE — Progress Notes (Signed)
Initial Nutrition Assessment  DOCUMENTATION CODES:   Underweight, Severe malnutrition in context of chronic illness  INTERVENTION:  -Ensure Enlive po TID, each supplement provides 350 kcal and 20 grams of protein -MVI with minerals daily  NUTRITION DIAGNOSIS:   Severe Malnutrition related to chronic illness (COPD) as evidenced by severe fat depletion, severe muscle depletion.  GOAL:   Patient will meet greater than or equal to 90% of their needs   MONITOR:   PO intake, Supplement acceptance, Weight trends, Labs, I & O's  REASON FOR ASSESSMENT:   Malnutrition Screening Tool    ASSESSMENT:   Pt with PMH significant for COPD on 3L O2 at baseline, chronic Afib, venous insufficiency, h/o transient hypotension, and multiple recent admissions for aspiration pneumonia admitted for HAP (pt from SNF).  Pt provided no meaningful responses to RD questions. RD assisted pt with meal setup. Discussed with RN.    Reviewed weight history. No significant weight changes noted but pt is underweight.   PO intake: 65-80% x 2 recorded meals  UOP: 481m documented x24 hours I/O: +[id number] admit  Medications: mag-ox, protonix, IV abx Labs: Na 133 (L)  NUTRITION - FOCUSED PHYSICAL EXAM:  Flowsheet Row Most Recent Value  Orbital Region Severe depletion  Upper Arm Region Severe depletion  Thoracic and Lumbar Region Severe depletion  Buccal Region Severe depletion  Temple Region Severe depletion  Clavicle Bone Region Severe depletion  Clavicle and Acromion Bone Region Severe depletion  Scapular Bone Region Severe depletion  Dorsal Hand Severe depletion  Patellar Region Severe depletion  Anterior Thigh Region Severe depletion  Posterior Calf Region Severe depletion  Edema (RD Assessment) None  Hair Reviewed  Eyes Reviewed  Mouth Reviewed  Skin Reviewed  Nails Reviewed       Diet Order:   Diet Order             Diet regular Room service appropriate? Yes; Fluid  consistency: Thin  Diet effective now                   EDUCATION NEEDS:   No education needs have been identified at this time  Skin:  Skin Assessment: Skin Integrity Issues: Skin Integrity Issues:: Stage II, Other (Comment) Stage II: coccyx Other: venous stasis ulcers R/L pretibial area  Last BM:  11/8  Height:   Ht Readings from Last 1 Encounters:  10/01/21 5' 11" (1.803 m)    Weight:   Wt Readings from Last 10 Encounters:  10/01/21 44.5 kg  09/10/21 46.3 kg  07/03/21 41.9 kg  06/17/21 46.4 kg  05/05/21 41 kg  03/07/21 43.5 kg  03/04/21 43.8 kg  02/23/21 42.9 kg  02/06/21 46.4 kg  12/26/20 44.2 kg   BMI:  Body mass index is 13.67 kg/m.  Estimated Nutritional Needs:   Kcal:  1400-1600  Protein:  70-80 grams  Fluid:  >1.4L     AmTheone Stanley MS, RD, LDN (she/her/hers) RD pager number and weekend/on-call pager number located in [place]

## 2021-10-03 ENCOUNTER — Inpatient Hospital Stay (HOSPITAL_COMMUNITY): Payer: Medicare Other

## 2021-10-03 DIAGNOSIS — J189 Pneumonia, unspecified organism: Secondary | ICD-10-CM | POA: Diagnosis not present

## 2021-10-03 DIAGNOSIS — Z9889 Other specified postprocedural states: Secondary | ICD-10-CM | POA: Diagnosis not present

## 2021-10-03 DIAGNOSIS — J90 Pleural effusion, not elsewhere classified: Secondary | ICD-10-CM | POA: Diagnosis not present

## 2021-10-03 DIAGNOSIS — J440 Chronic obstructive pulmonary disease with acute lower respiratory infection: Secondary | ICD-10-CM

## 2021-10-03 DIAGNOSIS — E46 Unspecified protein-calorie malnutrition: Secondary | ICD-10-CM

## 2021-10-03 DIAGNOSIS — J9621 Acute and chronic respiratory failure with hypoxia: Secondary | ICD-10-CM

## 2021-10-03 DIAGNOSIS — I4819 Other persistent atrial fibrillation: Secondary | ICD-10-CM

## 2021-10-03 DIAGNOSIS — F32A Depression, unspecified: Secondary | ICD-10-CM

## 2021-10-03 DIAGNOSIS — I2510 Atherosclerotic heart disease of native coronary artery without angina pectoris: Secondary | ICD-10-CM

## 2021-10-03 DIAGNOSIS — I509 Heart failure, unspecified: Secondary | ICD-10-CM

## 2021-10-03 DIAGNOSIS — I110 Hypertensive heart disease with heart failure: Secondary | ICD-10-CM

## 2021-10-03 DIAGNOSIS — D649 Anemia, unspecified: Secondary | ICD-10-CM

## 2021-10-03 DIAGNOSIS — I959 Hypotension, unspecified: Secondary | ICD-10-CM

## 2021-10-03 HISTORY — PX: IR THORACENTESIS ASP PLEURAL SPACE W/IMG GUIDE: IMG5380

## 2021-10-03 LAB — COMPREHENSIVE METABOLIC PANEL
ALT: 6 U/L (ref 0–44)
AST: 8 U/L — ABNORMAL LOW (ref 15–41)
Albumin: <1.5 g/dL — ABNORMAL LOW (ref 3.5–5.0)
Alkaline Phosphatase: 96 U/L (ref 38–126)
Anion gap: 10 (ref 5–15)
BUN: 13 mg/dL (ref 8–23)
CO2: 22 mmol/L (ref 22–32)
Calcium: 7.8 mg/dL — ABNORMAL LOW (ref 8.9–10.3)
Chloride: 101 mmol/L (ref 98–111)
Creatinine, Ser: 0.45 mg/dL (ref 0.44–1.00)
GFR, Estimated: >60 mL/min (ref >60)
Glucose, Bld: 70 mg/dL (ref 70–99)
Potassium: 4.1 mmol/L (ref 3.5–5.1)
Sodium: 133 mmol/L — ABNORMAL LOW (ref 135–145)
Total Bilirubin: 0.2 mg/dL — ABNORMAL LOW (ref 0.3–1.2)
Total Protein: 3.7 g/dL — ABNORMAL LOW (ref 6.5–8.1)

## 2021-10-03 LAB — PREPARE RBC (CROSSMATCH)

## 2021-10-03 LAB — GLUCOSE, PLEURAL OR PERITONEAL FLUID: Glucose, Fluid: 90 mg/dL

## 2021-10-03 LAB — CBC WITH DIFFERENTIAL/PLATELET
Abs Immature Granulocytes: 0.58 10*3/uL — ABNORMAL HIGH (ref 0.00–0.07)
Basophils Absolute: 0 10*3/uL (ref 0.0–0.1)
Basophils Relative: 0 %
Eosinophils Absolute: 0 10*3/uL (ref 0.0–0.5)
Eosinophils Relative: 0 %
HCT: 21.6 % — ABNORMAL LOW (ref 36.0–46.0)
Hemoglobin: 6.7 g/dL — CL (ref 12.0–15.0)
Immature Granulocytes: 3 %
Lymphocytes Relative: 7 %
Lymphs Abs: 1.3 10*3/uL (ref 0.7–4.0)
MCH: 27 pg (ref 26.0–34.0)
MCHC: 31 g/dL (ref 30.0–36.0)
MCV: 87.1 fL (ref 80.0–100.0)
Monocytes Absolute: 0.7 10*3/uL (ref 0.1–1.0)
Monocytes Relative: 4 %
Neutro Abs: 16.2 10*3/uL — ABNORMAL HIGH (ref 1.7–7.7)
Neutrophils Relative %: 86 %
Platelets: 553 10*3/uL — ABNORMAL HIGH (ref 150–400)
RBC: 2.48 MIL/uL — ABNORMAL LOW (ref 3.87–5.11)
RDW: 15.8 % — ABNORMAL HIGH (ref 11.5–15.5)
WBC: 18.9 10*3/uL — ABNORMAL HIGH (ref 4.0–10.5)
nRBC: 0 % (ref 0.0–0.2)

## 2021-10-03 LAB — GRAM STAIN

## 2021-10-03 LAB — CBC
HCT: 26.7 % — ABNORMAL LOW (ref 36.0–46.0)
Hemoglobin: 8.3 g/dL — ABNORMAL LOW (ref 12.0–15.0)
MCH: 26.4 pg (ref 26.0–34.0)
MCHC: 31.1 g/dL (ref 30.0–36.0)
MCV: 85 fL (ref 80.0–100.0)
Platelets: 732 10*3/uL — ABNORMAL HIGH (ref 150–400)
RBC: 3.14 MIL/uL — ABNORMAL LOW (ref 3.87–5.11)
RDW: 15.9 % — ABNORMAL HIGH (ref 11.5–15.5)
WBC: 25.2 10*3/uL — ABNORMAL HIGH (ref 4.0–10.5)
nRBC: 0 % (ref 0.0–0.2)

## 2021-10-03 LAB — HEMOGLOBIN AND HEMATOCRIT, BLOOD
HCT: 30.7 % — ABNORMAL LOW (ref 36.0–46.0)
Hemoglobin: 9.5 g/dL — ABNORMAL LOW (ref 12.0–15.0)

## 2021-10-03 LAB — PROTIME-INR
INR: 1.6 — ABNORMAL HIGH (ref 0.8–1.2)
Prothrombin Time: 18.9 seconds — ABNORMAL HIGH (ref 11.4–15.2)

## 2021-10-03 LAB — RETICULOCYTES
Immature Retic Fract: 24.3 % — ABNORMAL HIGH (ref 2.3–15.9)
RBC.: 3.13 MIL/uL — ABNORMAL LOW (ref 3.87–5.11)
Retic Count, Absolute: 57.6 10*3/uL (ref 19.0–186.0)
Retic Ct Pct: 1.8 % (ref 0.4–3.1)

## 2021-10-03 LAB — ABO/RH: ABO/RH(D): O NEG

## 2021-10-03 LAB — AMYLASE, PLEURAL OR PERITONEAL FLUID: Amylase, Fluid: 23 U/L

## 2021-10-03 LAB — PROTEIN, PLEURAL OR PERITONEAL FLUID: Total protein, fluid: 3.2 g/dL

## 2021-10-03 LAB — APTT: aPTT: 46 seconds — ABNORMAL HIGH (ref 24–36)

## 2021-10-03 MED ORDER — SODIUM CHLORIDE 0.9% IV SOLUTION: Freq: Once | INTRAVENOUS | Status: AC

## 2021-10-03 MED ORDER — ORAL CARE MOUTH RINSE
15.0000 mL | Freq: Two times a day (BID) | OROMUCOSAL | Status: DC
  Administered 2021-10-03 – 2021-10-09 (×13): 15 mL via OROMUCOSAL

## 2021-10-03 MED ORDER — METOPROLOL SUCCINATE ER 25 MG PO TB24
25.0000 mg | ORAL_TABLET | Freq: Every day | ORAL | Status: DC
  Administered 2021-10-04 – 2021-10-09 (×5): 25 mg via ORAL
  Filled 2021-10-03 (×6): qty 1

## 2021-10-03 MED ORDER — GUAIFENESIN 200 MG PO TABS
200.0000 mg | ORAL_TABLET | ORAL | Status: DC
  Administered 2021-10-03 – 2021-10-09 (×33): 200 mg via ORAL
  Filled 2021-10-03 (×41): qty 1

## 2021-10-03 MED ORDER — GUAIFENESIN 200 MG PO TABS
200.0000 mg | ORAL_TABLET | ORAL | Status: DC | PRN
  Filled 2021-10-03: qty 1

## 2021-10-03 MED ORDER — LIDOCAINE HCL 1 % IJ SOLN
INTRAMUSCULAR | Status: AC
  Filled 2021-10-03: qty 20

## 2021-10-03 MED ORDER — LIDOCAINE HCL (PF) 1 % IJ SOLN
INTRAMUSCULAR | Status: DC | PRN
  Administered 2021-10-03: 10 mL

## 2021-10-03 NOTE — Progress Notes (Signed)
CRITICAL VALUE ALERT  Critical Value:  Hbg 6.7  Date & Time Notied:  11/11 8:25  Provider Notified: MD Dwyane Dee  Orders Received/Actions taken: awaiting orders

## 2021-10-03 NOTE — TOC Initial Note (Signed)
Transition of Care [facility]) - Initial/Assessment Note    Patient Details  Name: Kristen [name] MRN: [id number] Date of Birth: 1946-11-09  [facility]) CM/SW Contact:    Coralee Pesa, [place] Phone Number: 10/03/2021, 2:37 PM  Clinical Narrative:                 CSW spoke with pt at bedside, and confirmed she came from [place]. Pt states she has been there for less than a week and would be agreeable to going back. CSW asked if there was anyone she wanted CSW to call. She initially stated her sister, but then stated she would call her instead. Pt has had 2 vaccines. CSW followed up with [facility], they are agreeable to accepting pt back, but will need an insurance auth. They stated she had been there several weeks and were trying to get her there for LTC. They noted she would need more rehab, as [facility] had cut her off too early. FL2 started, but will need to be finished with PT/OT notes. TOC will continue to follow for DC needs.  Expected Discharge Plan: [place] Barriers to Discharge: Continued Medical Work up, Ship broker   Patient Goals and CMS Choice Patient states their goals for this hospitalization and ongoing recovery are:: To return to [place]. CMS Medicare.gov Compare Post Acute Care list provided to:: Patient Choice offered to / list presented to : Patient  Expected Discharge Plan and Services Expected Discharge Plan: [place] Choice: [place] arrangements for the past 2 months: Single Family Home                                      Prior Living Arrangements/Services Living arrangements for the past 2 months: Single Family Home Lives with:: Siblings Patient language and need for interpreter reviewed:: Yes Do you feel safe going back to the place where you live?: Yes      Need for Family Participation in Patient Care: Yes (Comment) Care giver support system in place?: Yes  (comment)   Criminal Activity/Legal Involvement Pertinent to Current Situation/Hospitalization: No - Comment as needed  Activities of Daily Living Home Assistive Devices/Equipment: Eyeglasses, Oxygen, Nebulizer ADL Screening (condition at time of admission) Patient's cognitive ability adequate to safely complete daily activities?: Yes Is the patient deaf or have difficulty hearing?: No Does the patient have difficulty seeing, even when wearing glasses/contacts?: No Does the patient have difficulty concentrating, remembering, or making decisions?: No Patient able to express need for assistance with ADLs?: Yes Does the patient have difficulty dressing or bathing?: No Independently performs ADLs?: Yes (appropriate for developmental age) Does the patient have difficulty walking or climbing stairs?: Yes Weakness of Legs: Both Weakness of Arms/Hands: Both  Permission Sought/Granted Permission sought to share information with : Family Supports Permission granted to share information with : Yes, Verbal Permission Granted  Share Information with NAME: [name]     Permission granted to share info w Relationship: sister  Permission granted to share info w Contact Information: [phone]  Emotional Assessment Appearance:: Appears stated age Attitude/Demeanor/Rapport: Engaged Affect (typically observed): Appropriate Orientation: : Oriented to Self, Oriented to Place, Oriented to  Time, Oriented to Situation Alcohol / Substance Use: Not Applicable Psych Involvement: No (comment)  Admission diagnosis:  HAP (hospital-acquired pneumonia) [J18.9, Y95] Patient Active Problem List   Diagnosis  Date Noted   HAP (hospital-acquired pneumonia) 10/01/2021   Elevated troponin    Sepsis ([place]) 09/03/2021   Protein-calorie malnutrition, severe 09/03/2021   Community acquired pneumonia 09/01/2021   Diarrhea 09/01/2021   Chronic respiratory failure with hypoxia ([place]) 09/01/2021   Hypotension     Pressure injury of skin 06/08/2021   CAP (community acquired pneumonia) 05/29/2021   Aspiration pneumonia ([place]) 05/29/2021   Atrial fibrillation with RVR ([place]) 05/27/2021   Cellulitis of left lower extremity 05/06/2021   Aortic atherosclerosis ([place]) 03/07/2021   Opiate use 03/05/2021   Need for assessment by dentistry for poor dentition 03/05/2021   Abdominal pain 02/06/2021   Health care maintenance 12/27/2020   AF (paroxysmal atrial fibrillation) ([place]) 12/16/2020   Nausea 11/07/2020   Lower extremity ulceration ([place]) 10/04/2020   Generalized anxiety disorder 08/27/2020   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[place])    Smoking 07/18/2020   Venous insufficiency of both lower extremities 07/18/2020   Acute respiratory failure with hypoxia ([place]) 07/01/2020   COPD (chronic obstructive pulmonary disease) ([place]) 06/28/2020   Protein-calorie malnutrition ([place]) 06/28/2020   Hyponatremia 06/28/2020   PCP:  [name], MD Pharmacy:   [id number] [place] [place] [address]Phone: [phone]: [phone]    Social Determinants of Health (SDOH) Interventions    Readmission Risk Interventions Readmission Risk Prevention Plan 06/17/2021 06/02/2021 09/29/2020  Transportation Screening Complete Complete Complete  PCP or Specialist Appt within 5-7 Days - - Complete  Home Care Screening - - Complete  Medication Review (RN CM) - - Complete  Medication Review ([place] - Complete -  PCP or Specialist appointment within 3-5 days of discharge - Complete -  [place]- Complete -  SW Recovery Care/Counseling Consult Complete Complete -  Palliative Care Screening Complete Complete -  Skilled Nursing Facility Complete Complete -  Some recent data might be hidden

## 2021-10-03 NOTE — Plan of Care (Signed)
  Problem: Clinical Measurements: Goal: Diagnostic test results will improve Outcome: Completed/Met

## 2021-10-03 NOTE — Procedures (Signed)
PROCEDURE SUMMARY:  Successful US guided diagnostic and therapeutic right thoracentesis. Yielded 900 ml of hazy, amber fluid. Pt tolerated procedure well. No immediate complications.  Specimen was sent for labs. CXR ordered.  EBL < 1 mL  Tyson Alias, AGNP 10/03/2021 9:06 AM

## 2021-10-03 NOTE — Progress Notes (Signed)
10/03/21 0823  Assess: MEWS Score  Temp 98 F (36.7 C)  BP 106/63  Pulse Rate 69  ECG Heart Rate 75  Resp (!) 26  Level of Consciousness Alert  SpO2 91 %  O2 Device Nasal Cannula  Patient Activity (if Appropriate) In bed  O2 Flow Rate (L/min) 4 L/min  Assess: MEWS Score  MEWS Temp 0  MEWS Systolic 0  MEWS Pulse 0  MEWS RR 2  MEWS LOC 0  MEWS Score 2  MEWS Score Color Yellow  Assess: if the MEWS score is Yellow or Red  Were vital signs taken at a resting state? Yes  Focused Assessment No change from prior assessment  Does the patient meet 2 or more of the SIRS criteria? No  Early Detection of Sepsis Score *See Row Information* Low  MEWS guidelines implemented *See Row Information* No, previously yellow, continue vital signs every 4 hours  Treat  MEWS Interventions Escalated (See documentation below)  Pain Scale 0-10  Pain Score 0  Take Vital Signs  Increase Vital Sign Frequency  Yellow: Q 2hr X 2 then Q 4hr X 2, if remains yellow, continue Q 4hrs  Escalate  MEWS: Escalate Yellow: discuss with charge nurse/RN and consider discussing with provider and RRT  Notify: Charge Nurse/RN  Name of Charge Nurse/RN Notified [place], RN  Date Charge Nurse/RN Notified 10/03/21  Time Charge Nurse/RN Notified 0830  Notify: Provider  Provider Stacie [place], MD  Date Provider Notified 10/03/21  Time Provider Notified 0900  Notification Type Rounds  Notification Reason Other (Comment) (yellow mews)  Provider response In department;See new orders  Date of Provider Response 10/03/21  Time of Provider Response 0900  Document  Patient Outcome Other (Comment) (stable)  Progress note created (see row info) Yes

## 2021-10-03 NOTE — Progress Notes (Addendum)
HD#2 SUBJECTIVE:  Patient Summary: Kristen [name] is a cachetic  75 y.o. with a pertinent PMH of  COPD (3 L [place] at baseline), chronic A. fib, venous insufficiency, aspiration pneumonia, who presented with a 48-hour history of increased dyspnea with exertion and productive cough.  She was seen in the outpatient setting with a chest x-ray showing a right upper lobe infiltrate and given IM Rocephin and increase supplemental oxygen, but her symptoms progressed and admitted for acute hypoxic respiratory failure.   Overnight Events: Patient having some constipation. She was disimpacted with a large amount of dark stool removed.   Interim History: Patient evaluated at bedside after returning from thoracentesis. She states that her breathing has improved since removing all the fluid in her lungs. She states that she ahd some dark stools overnight, but denies any history of red/bloody stool. She denies any history of a colonoscopy, taking NSAIDs regularly, or ETOH use. She denies any abdominal pain or vomiting, but has had a little nausea. She continues to have a productive cough and feel poorly, but her breathing has improved.   OBJECTIVE:  Vital Signs: Vitals:   10/02/21 2114 10/02/21 2300 10/03/21 0540 10/03/21 0823  BP: (!) 110/58  138/82 106/63  Pulse: 63  65 69  Resp: 18  20 (!) 26  Temp: 98.2 F (36.8 C)  99.2 F (37.3 C) 98 F (36.7 C)  TempSrc: Oral  Oral Oral  SpO2: 90%  94% 91%  Weight:  45.1 kg    Height:       Supplemental O2: Nasal Cannula SpO2: 91 % O2 Flow Rate (L/min): 4 L/min  Filed Weights   10/01/21 1313 10/02/21 2300  Weight: 44.5 kg 45.1 kg     Intake/Output Summary (Last 24 hours) at 10/03/2021 0837 Last data filed at 10/03/2021 0600 Gross per 24 hour  Intake 1803.63 ml  Output 425 ml  Net 1378.63 ml   Net IO Since Admission: 4,312.16 mL [10/03/21 0837]  Physical Exam: Physical Exam Constitutional:      Appearance: She is ill-appearing (chronically).      Comments: Appears older than stated age  HENT:     Head: Normocephalic and atraumatic.     Mouth/Throat:     Mouth: Mucous membranes are dry.  Eyes:     Extraocular Movements: Extraocular movements intact.     Pupils: Pupils are equal, round, and reactive to light.  Cardiovascular:     Rate and Rhythm: Normal rate and regular rhythm.     Pulses: Normal pulses.     Heart sounds: Normal heart sounds.  Pulmonary:     Effort: No tachypnea, accessory muscle usage or respiratory distress.     Breath sounds: Decreased air movement present. Decreased breath sounds present.     Comments: Course upper respiratory breath sounds with significant coughing and productive cough. Abdominal:     General: There is no distension.     Palpations: Abdomen is soft.     Tenderness: There is no abdominal tenderness.  Musculoskeletal:        General: Normal range of motion.     Cervical back: Normal range of motion. No rigidity.     Comments: Stage 1 pressure wound on right heel and Skin  breakdown on the left heel.   Skin:    General: Skin is dry.     Capillary Refill: Capillary refill takes more than 3 seconds.     Findings: Bruising (nonblanching purpura on the left medial ankle) present.  Comments: Cool and pale extremities  Neurological:     Mental Status: She is alert and oriented to person, place, and time.     Motor: Weakness (generalized) present.    Patient Lines/Drains/Airways Status     Active Line/Drains/Airways     Name Placement date Placement time Site Days   Peripheral IV 10/01/21 20 G 1" Anterior;Right Forearm 10/01/21  1347  Forearm  2   External Urinary Catheter 10/02/21  0114  --  1   Pressure Injury 10/01/21 Coccyx Medial;Lower Stage 2 -  Partial thickness loss of dermis presenting as a shallow open injury with a red, pink wound bed without slough. 10/01/21  2026  -- 2   Wound / Incision (Open or Dehisced) 10/01/21 Venous stasis ulcer Pretibial Distal;Left;Medial 10/01/21   2026  Pretibial  2   Wound / Incision (Open or Dehisced) 10/01/21 Venous stasis ulcer Pretibial Distal;Right;Medial 10/01/21  2026  Pretibial  2   Wound / Incision (Open or Dehisced) 10/01/21 Other (Comment) Knee Anterior;Left 10/01/21  2026  Knee  2            Pertinent Labs: CBC Latest Ref Rng & Units 10/03/2021 10/02/2021 10/01/2021  WBC 4.0 - 10.5 K/uL 18.9(H) 27.8(H) 27.5(H)  Hemoglobin 12.0 - 15.0 g/dL 6.7(LL) 7.4(L) 7.6(L)  Hematocrit 36.0 - 46.0 % 21.6(L) 24.6(L) 24.8(L)  Platelets 150 - 400 K/uL 553(H) 661(H) 646(H)    CMP Latest Ref Rng & Units 10/03/2021 10/02/2021 10/01/2021  Glucose 70 - 99 mg/dL 70 95 108(H)  BUN 8 - 23 mg/dL 13 25(H) 26(H)  Creatinine 0.44 - 1.00 mg/dL 0.45 0.96 0.98  Sodium 135 - 145 mmol/L 133(L) 133(L) 133(L)  Potassium 3.5 - 5.1 mmol/L 4.1 4.5 4.2  Chloride 98 - 111 mmol/L 101 97(L) 93(L)  CO2 22 - 32 mmol/L _0 Calcium 8.9 - 10.3 mg/dL 7.8(L) 8.6(L) 8.4(L)  Total Protein 6.5 - 8.1 g/dL 3.7(L) - 5.5(L)  Total Bilirubin 0.3 - 1.2 mg/dL 0.2(L) - 0.4  Alkaline Phos 38 - 126 U/L 96 - 181(H)  AST 15 - 41 U/L 8(L) - 11(L)  ALT 0 - 44 U/L 6 - 8  Strep Pneumo Uag: not drawn MRSA PCR: Negative Legionella Ag: not drawn  Blood Culture    Component Value Date/Time   SDES BLOOD BLOOD LEFT FOREARM 10/01/2021 2037   SPECREQUEST  10/01/2021 2037    BOTTLES DRAWN AEROBIC AND ANAEROBIC Blood Culture adequate volume   CULT  10/01/2021 2037    NO GROWTH 2 DAYS Performed at [facility], [place] [address], [place], [place] [address]    REPTSTATUS PENDING 10/01/2021 2037     No results for input(s): GLUCAP in the last 72 hours.   Pertinent Imaging: Chest X-Ray: Result Date: 10/02/2021 1. Completely opacified right thorax consistent with pleural effusion and underlying airspace disease and or mass, aeration is worsened compared to yesterday's radiograph  2. Possible small left effusion with mild airspace disease at left base   CT  Chest Result Date: 10/01/2021 1. Complete opacification of the right upper lobe with a heterogeneous masslike consolidation. Interval development of a moderate volume right pleural effusion. 2. Mediastinal lymphadenopathy. 3. Trace left pleural effusion. 4. Aortic Atherosclerosis (ICD10-I70.0) and Emphysema (ICD10-J43.9).  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 She has had multiple hospitalizations for pneumonia and was recently discharged to a SNF where she currently lives. She is on 2L supplemental O2 at baseline. She presented for AoC respiratory failure thought to be 2/2 to CAP and placed on Vanc and Cefepime. CT showed significant RUL consolidation with mediastinal LAD. She has a significant history of tobacco use disorder, which raises concern for possible malignancy with post obstructive pneumonia. She is currently on 4L [place] and saturating well. She endorses improvement of her breathing s/p thoracentesis today. Differential includes CAP. WBC is trending downward and blood cultures are negative at 2 days.  - Vanc and cefepime 3/5, MRSA negative and will DC vanc today - Repeat CXR after thoracentesis - Trend WBC and Bcx - Get cell count, culture, and cytology for pleural fluid - Cont home albuterol, mometasone-formoterol, umeclidinium bromide - Aspiration precautions - May need bronch if pleural fluid results are indeterminate.   #Normocytic  Anemia: Patient has a history of normocytic anemia with a baseline Hgb of around 11-12 until this years when is has dropped considerably to the 8-9 range. I don't see any iron studies performed previously and she has never had a colonoscopy. She had a dark green stool last night, but she has been constipated with normal BUN.  Will need to be worked up further particularlly if she developed any specific - Type and Screen, Transfuse 1 unit PRBC - Will get reticulocyte count and pretransfusion cbc, PT/INR, PTT - Hold Xerelto and start SCDs  #Persistent Atrial Fibrillation (CHA2DS2-VASc 6, and HAS-BLED 3) Patient currently in sinus rhythm with normal rate. She has had some soft blood pressures and would likely benefit from decreasing metoprolol succinate form 25 mg BID to once daily.  - Continue amiodarone 200 mg daily - Change metoprolol to 25 mg daily - Hold Xerelto due to drop in Hgb.  CHF: Echo from 09/05/2021 showing an EF og 50% with regional wall motion abnormalities, Mildly reduced RV function, mod-severe RV dilation, and moderately elevated pulmonary pressures. There is RA dilation, mild MVR, moderate TVR.  - She is not on diuretic medications and does not appear fluid overloaded.   CAD: HTN: Patient had elevated troponin's at a recent hospitalization with Echo showing regional wall motion abnormalities. Cards was consulted and recommended conservative management for possible Type 2 NSTEMI and started on Plavix 75 mg daily. No ischemic evaluation was performed.  - Will continue metoprolol at a reduced dose  - Continue plavix 75 mg, will consider DC if she continues to have concern of bleeding.   Hypotension: - Continue midodrine 5 mg  Protein-caloric malnutrition: - Supplement diet  Depression: -mirtazipine 15 mg qHS , buspirone 10 mg    Best Practice: Diet: Regular diet IVF: Fluids: none, Rate: None VTE:  Code:  partial AB: Cefepime 3/5 days Therapy Recs: Pending, DME:  pending Family Contact: unknown DISPO: Anticipated discharge in 3-5 days to Skilled nursing facility pending Medical stability.  Signature: Lawerance Cruel, D.O.  Internal Medicine Resident, PGY-3 [facility]  Pager: [phone] 8:38 AM, 10/03/2021   Please contact the on call pager after 5 pm and on weekends at [phone].

## 2021-10-03 NOTE — Progress Notes (Signed)
Patient is constipated. Digital evacuation of stool rendered. Tolerated procedure well. Large amount of hard dark green stool taken out.  Soft stool remains in her rectal vault. Peri care rendered.

## 2021-10-04 DIAGNOSIS — Z7189 Other specified counseling: Secondary | ICD-10-CM

## 2021-10-04 DIAGNOSIS — J90 Pleural effusion, not elsewhere classified: Secondary | ICD-10-CM | POA: Diagnosis not present

## 2021-10-04 DIAGNOSIS — J189 Pneumonia, unspecified organism: Secondary | ICD-10-CM | POA: Diagnosis not present

## 2021-10-04 DIAGNOSIS — J440 Chronic obstructive pulmonary disease with acute lower respiratory infection: Secondary | ICD-10-CM | POA: Diagnosis not present

## 2021-10-04 DIAGNOSIS — Z9889 Other specified postprocedural states: Secondary | ICD-10-CM | POA: Diagnosis not present

## 2021-10-04 LAB — BPAM RBC
Blood Product Expiration Date: 202211302359
ISSUE DATE / TIME: 202211111438
Unit Type and Rh: 9500

## 2021-10-04 LAB — LACTATE DEHYDROGENASE, PLEURAL OR PERITONEAL FLUID: LD, Fluid: 67 U/L — ABNORMAL HIGH (ref 3–23)

## 2021-10-04 LAB — CBC
HCT: 29.4 % — ABNORMAL LOW (ref 36.0–46.0)
Hemoglobin: 9.3 g/dL — ABNORMAL LOW (ref 12.0–15.0)
MCH: 26.9 pg (ref 26.0–34.0)
MCHC: 31.6 g/dL (ref 30.0–36.0)
MCV: 85 fL (ref 80.0–100.0)
Platelets: 602 10*3/uL — ABNORMAL HIGH (ref 150–400)
RBC: 3.46 MIL/uL — ABNORMAL LOW (ref 3.87–5.11)
RDW: 15.5 % (ref 11.5–15.5)
WBC: 21 10*3/uL — ABNORMAL HIGH (ref 4.0–10.5)
nRBC: 0 % (ref 0.0–0.2)

## 2021-10-04 LAB — TYPE AND SCREEN
ABO/RH(D): O NEG
Antibody Screen: NEGATIVE
Unit division: 0

## 2021-10-04 LAB — BASIC METABOLIC PANEL
Anion gap: 7 (ref 5–15)
BUN: 16 mg/dL (ref 8–23)
CO2: 27 mmol/L (ref 22–32)
Calcium: 8.5 mg/dL — ABNORMAL LOW (ref 8.9–10.3)
Chloride: 97 mmol/L — ABNORMAL LOW (ref 98–111)
Creatinine, Ser: 0.57 mg/dL (ref 0.44–1.00)
GFR, Estimated: >60 mL/min (ref >60)
Glucose, Bld: 84 mg/dL (ref 70–99)
Potassium: 4.3 mmol/L (ref 3.5–5.1)
Sodium: 131 mmol/L — ABNORMAL LOW (ref 135–145)

## 2021-10-04 MED ORDER — OXYCODONE-ACETAMINOPHEN 5-325 MG PO TABS
1.0000 | ORAL_TABLET | Freq: Three times a day (TID) | ORAL | Status: DC
  Administered 2021-10-04 – 2021-10-05 (×2): 1 via ORAL
  Filled 2021-10-04 (×2): qty 1

## 2021-10-04 MED ORDER — PIPERACILLIN-TAZOBACTAM 4.5 G IVPB: 4.5000 g | Freq: Four times a day (QID) | INTRAVENOUS | Status: DC

## 2021-10-04 MED ORDER — OXYCODONE HCL 5 MG PO TABS
5.0000 mg | ORAL_TABLET | Freq: Three times a day (TID) | ORAL | Status: DC | PRN
  Administered 2021-10-05: 5 mg via ORAL
  Filled 2021-10-04 (×2): qty 1

## 2021-10-04 MED ORDER — PIPERACILLIN-TAZOBACTAM 3.375 G IVPB
3.3750 g | Freq: Three times a day (TID) | INTRAVENOUS | Status: DC
  Administered 2021-10-04 – 2021-10-09 (×16): 3.375 g via INTRAVENOUS
  Filled 2021-10-04 (×18): qty 50

## 2021-10-04 NOTE — Consult Note (Signed)
Palliative Medicine Inpatient Consult Note  Reason for consult:  symptom management, goals of care  HPI: Kristen [name] is a 75 yo female with history of COPD, severe protein malnutrition with BMI currently 13 who has a right sided consolidated pneumonia complicated by large parpneumonic effusion that is freeflowing without loculations. Suspected aspiration pneumonia and less likely lung cancer as she had a CT scan in April without a mass. She is not a candidate for surgical intervention or chest tube placement. She is being medically managed.  Clinical Assessment/Goals of Care: I have reviewed medical records including EPIC notes, labs and imaging, received report from bedside RN Seth Bake, assessed the patient.    I met with Kristen [name] to further discuss diagnosis prognosis, GOC, EOL wishes, disposition and options.I also talked to her sister Kristen [name]) and patient's niece Kristen [name] by phone. Kristen [name]'s contact is [phone]   I introduced Palliative Medicine as specialized medical care for people living with serious illness. It focuses on providing relief from the symptoms and stress of a serious illness. The goal is to improve quality of life for both the patient and the family.  A detailed discussion was had today regarding advanced directives.  Concepts specific to code status, artifical feeding and hydration, continued IV antibiotics and rehospitalization was had.  The difference between a aggressive medical intervention path  and a palliative comfort care path for this patient at this time was had. Values and goals of care important to patient and family were attempted to be elicited.   Kristen [name] has been in [facility] for rehab since her last admission. Prior to that she lived with her sister for about a year. Her sister helped with bathing and cooking. The sister's husband recently died and the niece states the sister cannot take the patient back into her home.  Kristen [name]'s  immediate goal is to see her grandson from [place]. She thinks he will visit Monday. He is currently in [facility] with family. The sister states it is a 50/50 chance the grandson with visit on Tuesday.   Kristen [name]'s code status in partial. She does not want chest compression, ventilations. BiPAP is acceptable and medications and defibrillation can be performed. She has a living will and I asked her sister if she could bring it in. Kristen [name] is tired. We discussed  how she feels, her current test results, and plans. She states she may consider hospice but wants to think about it.    Discussed the importance of continued conversation with family and their  medical providers regarding overall plan of care and treatment options, ensuring decisions are within the context of the patients values and GOCs.  Decision Maker: Patient, sister Kristen [name] if patient cannot make decisions, per patient  SUMMARY OF RECOMMENDATIONS    Code Status/Advance Care Planning:  Modified CODE   Symptom Management:  Dyspnea- albuterol every 6 hours, duonebs prn, guaifenesin, umeclidinium bromide, flutter value, oxygen,  Pain- acetaminophen prn, oxycodone prn   Palliative Prophylaxis:  Bowel routine- Colace prn GERD- pantoprazole Debility- feeding supplementation, Insomnia- mirtazapine Nausea-ondansetron prn Depression- buspirone 10 mg tid.   Psycho-social/Spiritual:  Desire for further Chaplaincy support: No Additional Recommendations:    Prognosis: poor considering baseline pulmonary disease and debility with new lung mass and effusion.   Discharge Planning: Patient was admitted from [facility], sister would want her to go back there unless inpatient Hospice with [facility] is indicated and an option.  Continued discussions to support patient and her wishes.  Palliative medicine will follow.  Vitals with BMI 10/04/2021 10/04/2021 10/04/2021  Height - - -  Weight - - -  BMI - - -  Systolic - 287 867   Diastolic - 62 69  Pulse 60 62 64    PPS: 40%   This conversation/these recommendations were discussed with patient primary care team, Dr. Ileene Musa.  Thank you for the opportunity to participate in the care of this patient and family.   Total Time: 70 minutes Greater than 50%  of this time was spent counseling and coordinating care related to the above assessment and plan.  [facility] Team Team Cell Phone: [phone] Please utilize secure chat with additional questions, if there is no response within 30 minutes please call the above phone number  Palliative Medicine Team providers are available by phone from 7am to 7pm daily and can be reached through the team cell phone.  Should this patient require assistance outside of these hours, please call the patient's attending physician.

## 2021-10-04 NOTE — Progress Notes (Signed)
HD#3 Subjective:  Overnight Events: NAEO   Patient says that she doesn't feel great today. Her whole body hurts and she feels worse than yesterday. She would be ok if the palliative care team came and talked to her. Also would like for the team to update her grandson.  Objective:  Vital signs in last 24 hours: Vitals:   10/03/21 2104 10/04/21 0534 10/04/21 0925 10/04/21 0932  BP:  136/69 130/62   Pulse: 65 64 62 60  Resp: _0 Temp:  98.4 F (36.9 C) 98.2 F (36.8 C)   TempSrc:  Oral Oral   SpO2: 95% 95% 94% 93%  Weight:      Height:       Supplemental O2: Nasal Cannula SpO2: 93 % O2 Flow Rate (L/min): 2 L/min FiO2 (%): 28 %   Physical Exam:  Constitutional: cachetic appearing frail elderly woman resting uncomfortably in bed, in no acute distress Eyes: conjunctiva non-erythematous Cardiovascular: regular rate and rhythm, no m/r/g Pulmonary/Chest: increased work of breathing, diminished breath sounds worse on the right posterior lungs fields Abdominal: soft, flat MSK: decreased muscle mass, able to move all extremities Neurological: sleepy, answering questions appropriately Skin: stage 1 pressure wound on right heel and Skin  breakdown on the left heel Psych: flat affect  Filed Weights   10/01/21 1313 10/02/21 2300  Weight: 44.5 kg 45.1 kg     Intake/Output Summary (Last 24 hours) at 10/04/2021 1151 Last data filed at 10/04/2021 0900 Gross per 24 hour  Intake 986.2 ml  Output 1050 ml  Net -63.8 ml   Net IO Since Admission: 4,834.15 mL [10/04/21 1151]  Pertinent Labs: CBC Latest Ref Rng & Units 10/04/2021 10/03/2021 10/03/2021  WBC 4.0 - 10.5 K/uL 21.0(H) - 25.2(H)  Hemoglobin 12.0 - 15.0 g/dL 9.3(L) 9.5(L) 8.3(L)  Hematocrit 36.0 - 46.0 % 29.4(L) 30.7(L) 26.7(L)  Platelets 150 - 400 K/uL 602(H) - 732(H)    CMP Latest Ref Rng & Units 10/04/2021 10/03/2021 10/02/2021  Glucose 70 - 99 mg/dL 84 70 95  BUN 8 - 23 mg/dL 16 13 25(H)  Creatinine  0.44 - 1.00 mg/dL 0.57 0.45 0.96  Sodium 135 - 145 mmol/L 131(L) 133(L) 133(L)  Potassium 3.5 - 5.1 mmol/L 4.3 4.1 4.5  Chloride 98 - 111 mmol/L 97(L) 101 97(L)  CO2 22 - 32 mmol/L _1 Calcium 8.9 - 10.3 mg/dL 8.5(L) 7.8(L) 8.6(L)  Total Protein 6.5 - 8.1 g/dL - 3.7(L) -  Total Bilirubin 0.3 - 1.2 mg/dL - 0.2(L) -  Alkaline Phos 38 - 126 U/L - 96 -  AST 15 - 41 U/L - 8(L) -  ALT 0 - 44 U/L - 6 -    Imaging: DG CHEST PORT 1 VIEW  Result Date: 10/03/2021 CLINICAL DATA:  Right thoracentesis cough EXAM: PORTABLE CHEST 1 VIEW COMPARISON:  Previous studies including the examination of 10/03/2021 FINDINGS: There is improvement in aeration of right lower lung fields following thoracentesis still, there is almost complete opacification of right upper lung fields and partial opacification of right lower lung fields. Left lung remains clear. There is minimal blunting of left lateral CP angle. There is no definite pneumothorax. There is small faint focus of lucency in the lateral aspect of right apex which was also evident in the previous study. IMPRESSION: There is interval decrease in amount of right pleural effusion after thoracentesis. There is no demonstrable pneumothorax. Still, right hemithorax is mostly opacified suggesting presence of large residual pleural  effusion and possibly underlying atelectasis/pneumonia. Small left pleural effusion is present. Electronically Signed   By: Elmer Picker M.D.   On: 10/03/2021 13:35    Assessment/Plan:   Active Problems:   COPD (chronic obstructive pulmonary disease) (HCC)   Protein-calorie malnutrition (HCC)   Generalized anxiety disorder   Lower extremity ulceration (HCC)   AF (paroxysmal atrial fibrillation) (HCC)   Pressure injury of skin   HAP (hospital-acquired pneumonia)   Patient Summary: Kristen [name] is a 75 y.o. with a pertinent PMH of  COPD (3 L [place] at baseline), chronic A. fib, venous insufficiency, aspiration pneumonia, who  presented with increasing dyspnea and cough admitted for acute on chronic hypoxic respiratory failure likely 2/2 pneumonia.  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 She has had multiple hospitalizations for pneumonia and is on 2L supplemental O2 at baseline. She presented for AoC respiratory failure. CT showed significant RUL consolidation with mediastinal LAD. She has a significant history of tobacco use disorder, which raises concern for possible malignancy  though given recent imaging this would be a rapidly progressive tumor. She is currently on 2L [place] and saturating well. Overall she is extremely weak appearing with very little reserve. Will discuss patient's status with family and discuss expectations and GOC. Likely not a candidate for an invasive procedures but will continue medical management with antibiotics. - palliative consult - continue cefepime  - f/u Bcx - Cont home albuterol, mometasone-formoterol, umeclidinium bromide - Aspiration precautions   #Normocytic Anemia: S/p 1 unit pRBC. Hemoglobin has responded appropriately. No current signs of  GI bleed but will continue to monitor for melanic stools.  - daily CBC - Hold Xerelto and start SCDs   #Persistent Atrial Fibrillation (CHA2DS2-VASc 6, and HAS-BLED 3) Patient currently in sinus rhythm with normal rate. Home metoprolol  decreased metoprolol succinate from 25 mg BID to once daily due to low normal Bps. - Continue amiodarone 200 mg daily - Change metoprolol to 25 mg daily - Hold Xerelto due to drop in Hgb.   CHF: Echo from 09/05/2021 showing an EF of 50% with regional wall motion abnormalities, Mildly reduced RV function, mod-severe RV dilation, and moderately elevated pulmonary pressures. There is RA  dilation, mild MVR, moderate TVR.  - She is not on diuretic medications and does not appear fluid overloaded.    CAD: HTN: Patient had elevated troponin's at a recent hospitalization with Echo showing regional wall motion abnormalities. Cards was consulted and recommended conservative management for possible Type 2 NSTEMI and started on Plavix 75 mg daily. No ischemic evaluation was performed.  - continue metoprolol at a reduced dose  - Continue plavix 75 mg, will consider DC if she continues to have concern of bleeding.    Hypotension: - Continue midodrine 5 mg   Protein-caloric malnutrition: - Supplement diet   Depression: -mirtazipine 15 mg qHS , buspirone 10 mg   Prior to Admission Living Arrangement: SNF Anticipated Discharge Location: SNF Barriers to Discharge: Medical stabilization Dispo: Anticipated discharge to Skilled nursing facility in 2 days pending.   Scarlett Presto, MD Internal Medicine Resident PGY-1 Pager [phone] Please contact the on call pager after 5 pm and on weekends at [phone].

## 2021-10-04 NOTE — Plan of Care (Signed)

## 2021-10-05 LAB — BASIC METABOLIC PANEL
Anion gap: 7 (ref 5–15)
BUN: 14 mg/dL (ref 8–23)
CO2: 30 mmol/L (ref 22–32)
Calcium: 8.4 mg/dL — ABNORMAL LOW (ref 8.9–10.3)
Chloride: 95 mmol/L — ABNORMAL LOW (ref 98–111)
Creatinine, Ser: 0.69 mg/dL (ref 0.44–1.00)
GFR, Estimated: >60 mL/min (ref >60)
Glucose, Bld: 95 mg/dL (ref 70–99)
Potassium: 4.1 mmol/L (ref 3.5–5.1)
Sodium: 132 mmol/L — ABNORMAL LOW (ref 135–145)

## 2021-10-05 LAB — CBC
HCT: 32.4 % — ABNORMAL LOW (ref 36.0–46.0)
Hemoglobin: 10.5 g/dL — ABNORMAL LOW (ref 12.0–15.0)
MCH: 27.6 pg (ref 26.0–34.0)
MCHC: 32.4 g/dL (ref 30.0–36.0)
MCV: 85 fL (ref 80.0–100.0)
Platelets: 591 10*3/uL — ABNORMAL HIGH (ref 150–400)
RBC: 3.81 MIL/uL — ABNORMAL LOW (ref 3.87–5.11)
RDW: 15.9 % — ABNORMAL HIGH (ref 11.5–15.5)
WBC: 16.5 10*3/uL — ABNORMAL HIGH (ref 4.0–10.5)
nRBC: 0 % (ref 0.0–0.2)

## 2021-10-05 MED ORDER — OXYCODONE HCL 5 MG PO TABS
5.0000 mg | ORAL_TABLET | Freq: Four times a day (QID) | ORAL | Status: DC | PRN
  Administered 2021-10-05: 5 mg via ORAL
  Filled 2021-10-05 (×2): qty 1

## 2021-10-05 MED ORDER — OXYCODONE-ACETAMINOPHEN 5-325 MG PO TABS
1.0000 | ORAL_TABLET | Freq: Four times a day (QID) | ORAL | Status: DC
  Administered 2021-10-06 – 2021-10-09 (×17): 1 via ORAL
  Filled 2021-10-05 (×17): qty 1

## 2021-10-05 MED ORDER — OXYCODONE-ACETAMINOPHEN 5-325 MG PO TABS
1.0000 | ORAL_TABLET | Freq: Three times a day (TID) | ORAL | Status: DC
  Administered 2021-10-05: 1 via ORAL
  Filled 2021-10-05: qty 1

## 2021-10-05 MED ORDER — OXYCODONE HCL 5 MG PO TABS
5.0000 mg | ORAL_TABLET | Freq: Four times a day (QID) | ORAL | Status: DC | PRN
  Administered 2021-10-05 – 2021-10-09 (×11): 5 mg via ORAL
  Filled 2021-10-05 (×10): qty 1

## 2021-10-05 NOTE — Progress Notes (Signed)
HD#4 Subjective:  Overnight Events: NAE Patient was evaluated bedside finishing her breakfast. States she was waiting for her sausage and coffee.  She denies any complaints. States her sister, nephew and friend came and visited her last night. States her grandson will be coming to visit her tomorrow or Tuesday.  Denies any shortness of breath or chest pain  Ms. hours today she states Objective:  Vital signs in last 24 hours: Vitals:   10/04/21 2126 10/05/21 0521 10/05/21 0919 10/05/21 0940  BP: (!) 150/66 136/69 132/61   Pulse: 66 (!) 59 60   Resp: _0 Temp: 98 F (36.7 C) 98.2 F (36.8 C) 98 F (36.7 C)   TempSrc: Oral  Oral   SpO2: 94% 96% 97% 97%  Weight:      Height:       Supplemental O2: Nasal Cannula SpO2: 97 % O2 Flow Rate (L/min): 2 L/min FiO2 (%): 28 %   Physical Exam:  Constitutional: Very cachectic and frail elderly woman sitting up in bed eating breakfast.  Malnourished.  Cardiovascular: RRR. No m/r/g.  Pulmonary/Chest: Pulmonary cachexia.  Decreased breath sounds throughout. Abdominal: Soft.  NT/ND MSK: Decreased muscle mass. Neurological: Alert and oriented x3.  No focal deficits. Skin: Dry and warm. Psych: Normal mood and affect  Filed Weights   10/01/21 1313 10/02/21 2300  Weight: 44.5 kg 45.1 kg     Intake/Output Summary (Last 24 hours) at 10/05/2021 1628 Last data filed at 10/05/2021 1536 Gross per 24 hour  Intake 1230.04 ml  Output 1700 ml  Net -469.96 ml   Net IO Since Admission: 4,154.19 mL [10/05/21 1628]  Pertinent Labs: CBC Latest Ref Rng & Units 10/05/2021 10/04/2021 10/03/2021  WBC 4.0 - 10.5 K/uL 16.5(H) 21.0(H) -  Hemoglobin 12.0 - 15.0 g/dL 10.5(L) 9.3(L) 9.5(L)  Hematocrit 36.0 - 46.0 % 32.4(L) 29.4(L) 30.7(L)  Platelets 150 - 400 K/uL 591(H) 602(H) -    CMP Latest Ref Rng & Units 10/05/2021 10/04/2021 10/03/2021  Glucose 70 - 99 mg/dL 95 84 70  BUN 8 - 23 mg/dL _1 Creatinine 0.44 - 1.00 mg/dL 0.69 0.57  0.45  Sodium 135 - 145 mmol/L 132(L) 131(L) 133(L)  Potassium 3.5 - 5.1 mmol/L 4.1 4.3 4.1  Chloride 98 - 111 mmol/L 95(L) 97(L) 101  CO2 22 - 32 mmol/L _2 Calcium 8.9 - 10.3 mg/dL 8.4(L) 8.5(L) 7.8(L)  Total Protein 6.5 - 8.1 g/dL - - 3.7(L)  Total Bilirubin 0.3 - 1.2 mg/dL - - 0.2(L)  Alkaline Phos 38 - 126 U/L - - 96  AST 15 - 41 U/L - - 8(L)  ALT 0 - 44 U/L - - 6    Imaging: No results found.  Assessment/Plan:   Active Problems:   COPD (chronic obstructive pulmonary disease) (HCC)   Protein-calorie malnutrition (HCC)   Generalized anxiety disorder   Lower extremity ulceration (HCC)   AF (paroxysmal atrial fibrillation) (HCC)   Pressure injury of skin   HAP (hospital-acquired pneumonia)   Patient Summary: Kristen [name] is a 75 y.o. with a pertinent PMH of  COPD (3 L [place] at baseline), chronic A. fib, venous insufficiency, aspiration pneumonia, who presented with increasing dyspnea and cough admitted for acute on chronic hypoxic respiratory failure likely 2/2 pneumonia.  #Acute on Chronic Hypoxic Respiratory Failure, Multifactorial #CAP #RUL Heterogenous consolidation with mediastinal LAD #Moderate Rt Exudative Pleural Effusion: #COPD (Gold 4) Kristen [name] is comfortable this morning eating her breakfast. Currently  on 2 L [place] with O2 sats above 95%.  She understands that she has had multiple hospitalizations has continued to get weaker and weaker.  Per patient's sister, his COPD has taken a toll on her before she even moved to [place].  Her prognosis remains poor considering multiple pulmonary diseases and worsening functional status in the setting of new lung mass and effusion. Has established with palliative care and interested in hospice -- Continue Zosyn for CAP,  -- Palliative care following, appreciate assistance  -- Patient and family interested in inpatient hospice with [facility] --Blood cultures no growth after 4 day -- Continue on daily inhalers and as  needed albuterol --Aspiration precaution   #Normocytic Anemia: S/p 1 unit pRBC.  Hemoglobin improved to 10.5.  No signs of acute bleed.  Plan to restart Xarelto if hemoglobin remains stable tomorrow. --Continue to hold Xarelto, continue SCDs   #Persistent Atrial Fibrillation (CHA2DS2-VASc 6, and HAS-BLED 3) Remains in normal sinus with HR in the 60s.  -- Continue amiodarone, metoprolol --Restart Xarelto if hemoglobin remains stable tomorrow   CHF: Echo from 09/05/2021 showing an EF of 50% with regional wall motion abnormalities, Mildly reduced RV function, mod-severe RV dilation, and moderately elevated pulmonary pressures. There is RA dilation, mild MVR, moderate TVR.  Not fluid overloaded on exam.  On 2 L [place] with O2 sats above 95%. --Continue to monitor closely   CAD: HTN: Hypotension Stable. SBP in the 120s to 130s.  Denies any chest pain or shortness of breath.   --Continue metoprolol and Plavix --Continue midodrine 5 mg daily   Protein-caloric malnutrition: Continue dietary supplementation Depression: Continue mirtazipine 15 mg qHS , buspirone 10 mg   Prior to Admission Living Arrangement: SNF Anticipated Discharge Location: [facility] Barriers to Discharge: Medical stabilization Dispo: [facility]ticipated [facility]  in 1-2 days   Kristen Axon, MD 10/05/2021, 4:46 PM IM Resident, PGY-2 Kristen [name] 41:10  Please contact the on call pager after 5 pm and on weekends at [phone].

## 2021-10-06 DIAGNOSIS — Z515 Encounter for palliative care: Secondary | ICD-10-CM

## 2021-10-06 DIAGNOSIS — R627 Adult failure to thrive: Secondary | ICD-10-CM

## 2021-10-06 LAB — CBC
HCT: 29.6 % — ABNORMAL LOW (ref 36.0–46.0)
Hemoglobin: 9.2 g/dL — ABNORMAL LOW (ref 12.0–15.0)
MCH: 26.9 pg (ref 26.0–34.0)
MCHC: 31.1 g/dL (ref 30.0–36.0)
MCV: 86.5 fL (ref 80.0–100.0)
Platelets: 640 10*3/uL — ABNORMAL HIGH (ref 150–400)
RBC: 3.42 MIL/uL — ABNORMAL LOW (ref 3.87–5.11)
RDW: 16.3 % — ABNORMAL HIGH (ref 11.5–15.5)
WBC: 16.4 10*3/uL — ABNORMAL HIGH (ref 4.0–10.5)
nRBC: 0 % (ref 0.0–0.2)

## 2021-10-06 LAB — CULTURE, BLOOD (SINGLE): Culture: NO GROWTH

## 2021-10-06 LAB — BASIC METABOLIC PANEL
Anion gap: 6 (ref 5–15)
BUN: 11 mg/dL (ref 8–23)
CO2: 32 mmol/L (ref 22–32)
Calcium: 8.1 mg/dL — ABNORMAL LOW (ref 8.9–10.3)
Chloride: 93 mmol/L — ABNORMAL LOW (ref 98–111)
Creatinine, Ser: 0.84 mg/dL (ref 0.44–1.00)
GFR, Estimated: >60 mL/min (ref >60)
Glucose, Bld: 81 mg/dL (ref 70–99)
Potassium: 4 mmol/L (ref 3.5–5.1)
Sodium: 131 mmol/L — ABNORMAL LOW (ref 135–145)

## 2021-10-06 LAB — CULTURE, BLOOD (ROUTINE X 2)
Culture: NO GROWTH
Culture: NO GROWTH
Special Requests: ADEQUATE

## 2021-10-06 LAB — CYTOLOGY - NON PAP

## 2021-10-06 MED ORDER — SODIUM CHLORIDE 0.9 % IV BOLUS
1000.0000 mL | Freq: Once | INTRAVENOUS | Status: AC
  Administered 2021-10-06: 1000 mL via INTRAVENOUS

## 2021-10-06 NOTE — Care Management Important Message (Signed)
Important Message  Patient Details  Name: Kristen [name] MRN: [id number] Date of Birth: 02/05/1946   Medicare Important Message Given:  Yes     [name] [address] 10/06/2021, 4:20 PM

## 2021-10-06 NOTE — Progress Notes (Addendum)
10/06/21 1002  Vitals  Temp 97.9 F (36.6 C)  Temp Source Oral  BP (!) 76/44  MAP (mmHg) (!) 55  BP Location Right Arm  BP Method Automatic  Patient Position (if appropriate) Lying  Pulse Rate (!) 55  Resp 18  MEWS COLOR  MEWS Score Color Yellow  Oxygen Therapy  SpO2 94 %  O2 Device Nasal Cannula  O2 Flow Rate (L/min) 2 L/min  MEWS Score  MEWS Temp 0  MEWS Systolic 2  MEWS Pulse 0  MEWS RR 0  MEWS LOC 0  MEWS Score 2   Patient is asymtomatic, sitting up in bed watching television.  Will monitor.  Dr. Dyann Ruddle notified.

## 2021-10-06 NOTE — Progress Notes (Signed)
Patient ID: Kristen [name], female   DOB: [birth year], 75 y.o.   MRN: [id number]    Progress Note from the Palliative Medicine Team at [facility]   Patient Name: Kristen [name]        Date: 10/06/2021 DOB: 02/05/1946  Age: 75 y.o. MRN#: [id number] Attending Physician: Angelica Pou, MD Primary Care Physician: [name], MD Admit Date: 10/01/2021   Medical records reviewed   Kristen [name] is a 75 yo female with history of COPD, severe protein malnutrition with BMI currently 13 who has a right sided consolidated pneumonia complicated by large parpneumonic effusion that is freeflowing without loculations. Suspected aspiration pneumonia and less likely lung cancer as she had a CT scan in April without a mass. She is not a candidate for surgical intervention or chest tube placement. She is being medically managed.  Patient has had continued physical and functional decline over the past many months.  He has had 3 recent hospitalizations.   This NP visited patient at the bedside as a follow up to  yesterday's [place].  I then spoke to the patient's Sister Kristen [name] DOS by phone/H POA.  Continued conversation regarding current medical situation; diagnosis, gnosis, goals of care, treatment option decisions, advanced care planning, and anticipatory care needs.  Attempted to elicit patient's values and goals of care.  Education offered on the difference between an aggressive medical intervention path and a palliative comfort path for this patient this time in this situation.  Education offered on hospice philosophy and hospice benefit.  Discussed with patient the importance of continued conversation with her family and the medical providers regarding overall plan of care and treatment options,  ensuring decisions are within the context of the patients values and GOCs.  Plan is to met tomorrow at 10:00 am with sister at bedside.     Questions and concerns addressed     Total time  spent on the unit was 35 minutes  Greater than 50% of the time was spent in counseling and coordination of care  Kristen Lessen NP  Palliative Medicine Team Team Phone # [phone] Pager [phone]

## 2021-10-06 NOTE — Progress Notes (Signed)
HD#5 Subjective:  Overnight Events: NAEO   Patient with a low blood pressure this morning but is comfortable and chatty laying in bed. Says that she feels better this morning and that her breathing is doing better today. Coughing up a lot of mucous but it seems less discolored today and it is coming up easier.  Objective:  Vital signs in last 24 hours: Vitals:   10/05/21 1646 10/05/21 1939 10/05/21 2000 10/06/21 0500  BP: (!) 111/57  135/63 (!) 108/55  Pulse: 61  66 63  Resp: _0 Temp: 98.1 F (36.7 C)  98.9 F (37.2 C) 98.2 F (36.8 C)  TempSrc: Oral  Oral Oral  SpO2: 100% 92% 94% 93%  Weight:      Height:       Supplemental O2: Nasal Cannula SpO2: 93 % O2 Flow Rate (L/min): 2 L/min FiO2 (%): 28 %   Physical Exam:  Constitutional: Very cachectic and frail chatty elderly woman in NAD  Cardiovascular: RRR. No m/r/g.  Pulmonary/Chest: Pulmonary cachexia.  coarse breath sounds throughout Abdominal: Soft.  scaphoid, NT/ND MSK: Decreased muscle mass. Neurological: Alert and oriented x3.  No focal deficits. Skin: heel breakdown with bandages c/d/I, decreased skin turgor Psych: Normal mood and affect    Filed Weights   10/01/21 1313 10/02/21 2300  Weight: 44.5 kg 45.1 kg     Intake/Output Summary (Last 24 hours) at 10/06/2021 0617 Last data filed at 10/05/2021 2000 Gross per 24 hour  Intake 549.44 ml  Output 800 ml  Net -250.56 ml   Net IO Since Admission: 4,168.7 mL [10/06/21 0617]  Pertinent Labs: CBC Latest Ref Rng & Units 10/05/2021 10/04/2021 10/03/2021  WBC 4.0 - 10.5 K/uL 16.5(H) 21.0(H) -  Hemoglobin 12.0 - 15.0 g/dL 10.5(L) 9.3(L) 9.5(L)  Hematocrit 36.0 - 46.0 % 32.4(L) 29.4(L) 30.7(L)  Platelets 150 - 400 K/uL 591(H) 602(H) -    CMP Latest Ref Rng & Units 10/05/2021 10/04/2021 10/03/2021  Glucose 70 - 99 mg/dL 95 84 70  BUN 8 - 23 mg/dL _1 Creatinine 0.44 - 1.00 mg/dL 0.69 0.57 0.45  Sodium 135 - 145 mmol/L 132(L) 131(L) 133(L)   Potassium 3.5 - 5.1 mmol/L 4.1 4.3 4.1  Chloride 98 - 111 mmol/L 95(L) 97(L) 101  CO2 22 - 32 mmol/L _2 Calcium 8.9 - 10.3 mg/dL 8.4(L) 8.5(L) 7.8(L)  Total Protein 6.5 - 8.1 g/dL - - 3.7(L)  Total Bilirubin 0.3 - 1.2 mg/dL - - 0.2(L)  Alkaline Phos 38 - 126 U/L - - 96  AST 15 - 41 U/L - - 8(L)  ALT 0 - 44 U/L - - 6    Imaging: No results found.  Assessment/Plan:   Active Problems:   COPD (chronic obstructive pulmonary disease) (HCC)   Protein-calorie malnutrition (HCC)   Generalized anxiety disorder   Lower extremity ulceration (HCC)   AF (paroxysmal atrial fibrillation) (HCC)   Pressure injury of skin   HAP (hospital-acquired pneumonia)   Patient Summary: Kristen [name] is a 75 y.o. with a pertinent PMH of  COPD (3 L [place] at baseline), chronic A. fib, venous insufficiency, aspiration pneumonia, who presented with increasing dyspnea and cough admitted for acute on chronic hypoxic respiratory failure likely 2/2 pneumonia.   #Acute on Chronic Hypoxic Respiratory Failure, Multifactorial #CAP #RUL Heterogenous consolidation with mediastinal LAD #Moderate Rt Exudative Pleural Effusion: #COPD (Gold 4) Ms. Tilmon is comfortable this morning. Currently on 2 L [place] with O2 sats  above 95%.  She understands that she has had multiple hospitalizations has continued to get weaker and weaker.  Per patient's sister, his COPD has taken a toll on her before she even moved to [place].  Her prognosis remains poor considering multiple pulmonary diseases and worsening functional status in the setting of new lung mass and effusion. Has established with palliative care and interested in hospice -- Continue Zosyn for CAP,  -- Palliative care following, appreciate assistance             -- based on patient's functional status probably will not qualify [facility] at this time, will reach out to CM --Blood cultures no growth after 4 day -- Continue on daily inhalers and as needed  albuterol --Aspiration precaution   #Normocytic Anemia: S/p 1 unit pRBC.  Hemoglobin improved to 10.5.  No signs of acute bleed.  Plan to restart Xarelto if hemoglobin remains stable tomorrow. --Continue to hold Xarelto, continue SCDs - daily CBC   #Persistent Atrial Fibrillation (CHA2DS2-VASc 6, and HAS-BLED 3) Remains in normal sinus with HR in the 60s.  -- Continue amiodarone, metoprolol --Restart Xarelto if hemoglobin remains stable 11/14   CHF: Echo from 09/05/2021 showing an EF of 50% with regional wall motion abnormalities, Mildly reduced RV function, mod-severe RV dilation, and moderately elevated pulmonary pressures. There is RA dilation, mild MVR, moderate TVR.  Not fluid overloaded on exam.  On 2 L [place] with O2 sats above 95%. --Continue to monitor closely   CAD: HTN: Hypotension Patient had an asymptomatic episode of hypotension this morning with systolic BP in the 40B. Will give a bolus of 1L NS --Continue metoprolol and Plavix -- hold metoprolol for HR <60 or BP <100 --Continue midodrine 5 mg daily   Protein-caloric malnutrition: Continue dietary supplementation Depression: Continue mirtazipine 15 mg qHS , buspirone 10 mg     Scarlett Presto, MD Internal Medicine Resident PGY-1 Pager [phone] Please contact the on call pager after 5 pm and on weekends at [phone].

## 2021-10-07 DIAGNOSIS — J440 Chronic obstructive pulmonary disease with acute lower respiratory infection: Secondary | ICD-10-CM | POA: Diagnosis not present

## 2021-10-07 DIAGNOSIS — R531 Weakness: Secondary | ICD-10-CM

## 2021-10-07 DIAGNOSIS — J90 Pleural effusion, not elsewhere classified: Secondary | ICD-10-CM | POA: Diagnosis not present

## 2021-10-07 DIAGNOSIS — I2510 Atherosclerotic heart disease of native coronary artery without angina pectoris: Secondary | ICD-10-CM

## 2021-10-07 DIAGNOSIS — I509 Heart failure, unspecified: Secondary | ICD-10-CM

## 2021-10-07 DIAGNOSIS — D649 Anemia, unspecified: Secondary | ICD-10-CM

## 2021-10-07 DIAGNOSIS — J189 Pneumonia, unspecified organism: Secondary | ICD-10-CM | POA: Diagnosis not present

## 2021-10-07 DIAGNOSIS — E46 Unspecified protein-calorie malnutrition: Secondary | ICD-10-CM

## 2021-10-07 DIAGNOSIS — I110 Hypertensive heart disease with heart failure: Secondary | ICD-10-CM

## 2021-10-07 DIAGNOSIS — I4819 Other persistent atrial fibrillation: Secondary | ICD-10-CM

## 2021-10-07 DIAGNOSIS — J9621 Acute and chronic respiratory failure with hypoxia: Secondary | ICD-10-CM | POA: Diagnosis not present

## 2021-10-07 DIAGNOSIS — F32A Depression, unspecified: Secondary | ICD-10-CM

## 2021-10-07 LAB — BASIC METABOLIC PANEL
Anion gap: 8 (ref 5–15)
BUN: 13 mg/dL (ref 8–23)
CO2: 28 mmol/L (ref 22–32)
Calcium: 7.9 mg/dL — ABNORMAL LOW (ref 8.9–10.3)
Chloride: 93 mmol/L — ABNORMAL LOW (ref 98–111)
Creatinine, Ser: 0.75 mg/dL (ref 0.44–1.00)
GFR, Estimated: >60 mL/min (ref >60)
Glucose, Bld: 89 mg/dL (ref 70–99)
Potassium: 4 mmol/L (ref 3.5–5.1)
Sodium: 129 mmol/L — ABNORMAL LOW (ref 135–145)

## 2021-10-07 LAB — CBC
HCT: 27.5 % — ABNORMAL LOW (ref 36.0–46.0)
Hemoglobin: 8.6 g/dL — ABNORMAL LOW (ref 12.0–15.0)
MCH: 27.3 pg (ref 26.0–34.0)
MCHC: 31.3 g/dL (ref 30.0–36.0)
MCV: 87.3 fL (ref 80.0–100.0)
Platelets: 552 10*3/uL — ABNORMAL HIGH (ref 150–400)
RBC: 3.15 MIL/uL — ABNORMAL LOW (ref 3.87–5.11)
RDW: 16.7 % — ABNORMAL HIGH (ref 11.5–15.5)
WBC: 16.4 10*3/uL — ABNORMAL HIGH (ref 4.0–10.5)
nRBC: 0 % (ref 0.0–0.2)

## 2021-10-07 NOTE — Progress Notes (Signed)
HD#6 Subjective:  Overnight Events: NAEO   Meeting with palliative care and her family at bedside today. Looking forward to seeing her grandson today. Is happy that she is seeing familiar faces among her care team and not all new faces.  Objective:  Vital signs in last 24 hours: Vitals:   10/07/21 0806 10/07/21 0807 10/07/21 0853 10/07/21 0857  BP:   (!) 107/59 (!) 107/59  Pulse:   70 68  Resp:    16  Temp:    98.3 F (36.8 C)  TempSrc:      SpO2: 93% 93%  95%  Weight:      Height:       Supplemental O2: Nasal Cannula SpO2: 95 % O2 Flow Rate (L/min): 2 L/min FiO2 (%): 28 %   Physical Exam:  Constitutional: Very cachectic elderly woman dabbing at her eyes with a tissue Pulmonary/Chest: Pulmonary cachexia. normal WOB on 2 L [place], able to speak in full sentences Abdominal: Soft.  scaphoid MSK: Decreased muscle mass. Neurological: Alert and oriented x3.  No focal deficits. Skin: heel breakdown with bandages c/d/I, decreased skin turgor Psych: tearful affect    Filed Weights   10/01/21 1313 10/02/21 2300  Weight: 44.5 kg 45.1 kg     Intake/Output Summary (Last 24 hours) at 10/07/2021 0938 Last data filed at 10/07/2021 0700 Gross per 24 hour  Intake 549.98 ml  Output 450 ml  Net 99.98 ml   Net IO Since Admission: 4,354.21 mL [10/07/21 0938]  Pertinent Labs: CBC Latest Ref Rng & Units 10/07/2021 10/06/2021 10/05/2021  WBC 4.0 - 10.5 K/uL 16.4(H) 16.4(H) 16.5(H)  Hemoglobin 12.0 - 15.0 g/dL 8.6(L) 9.2(L) 10.5(L)  Hematocrit 36.0 - 46.0 % 27.5(L) 29.6(L) 32.4(L)  Platelets 150 - 400 K/uL 552(H) 640(H) 591(H)    CMP Latest Ref Rng & Units 10/07/2021 10/06/2021 10/05/2021  Glucose 70 - 99 mg/dL 89 81 95  BUN 8 - 23 mg/dL _0 Creatinine 0.44 - 1.00 mg/dL 0.75 0.84 0.69  Sodium 135 - 145 mmol/L 129(L) 131(L) 132(L)  Potassium 3.5 - 5.1 mmol/L 4.0 4.0 4.1  Chloride 98 - 111 mmol/L 93(L) 93(L) 95(L)  CO2 22 - 32 mmol/L 28 32 30  Calcium 8.9 - 10.3 mg/dL  7.9(L) 8.1(L) 8.4(L)  Total Protein 6.5 - 8.1 g/dL - - -  Total Bilirubin 0.3 - 1.2 mg/dL - - -  Alkaline Phos 38 - 126 U/L - - -  AST 15 - 41 U/L - - -  ALT 0 - 44 U/L - - -    Imaging: No results found.  Assessment/Plan:   Active Problems:   COPD (chronic obstructive pulmonary disease) (HCC)   Protein-calorie malnutrition (HCC)   Generalized anxiety disorder   Lower extremity ulceration (HCC)   AF (paroxysmal atrial fibrillation) (HCC)   Pressure injury of skin   HAP (hospital-acquired pneumonia)   Patient Summary: Kristen [name] is a 75 y.o. with a pertinent PMH of  COPD (3 L [place] at baseline), chronic A. fib, venous insufficiency, aspiration pneumonia, who presented with increasing dyspnea and cough admitted for acute on chronic hypoxic respiratory failure likely 2/2 pneumonia.   #Acute on Chronic Hypoxic Respiratory Failure, Multifactorial #CAP #RUL Heterogenous consolidation with mediastinal LAD #Moderate Rt Exudative Pleural Effusion: #COPD (Gold 4) Her prognosis remains poor considering multiple pulmonary diseases and worsening functional status in the setting of new lung mass and effusion. Has established with palliative care and interested in hospice, but will continue full scope  of care at least for now pending more family discussions. Will follow up after palliative care meeting. WBC has decreased some after starting antibiotics but has been stable for the last several days at about 16.5 -- Continue Zosyn for CAP,  -- Palliative care following, appreciate assistance --Blood cultures no growth after 4 day -- Continue on daily inhalers and as needed albuterol --Aspiration precaution   #Normocytic Anemia: S/p 1 unit pRBC.  Hemoglobin improved to 10.5 but has now trended down to 8.6. --Continue to hold Xarelto, continue SCDs - daily CBC   #Persistent Atrial Fibrillation (CHA2DS2-VASc 6, and HAS-BLED 3) Remains in normal sinus with HR in the 60s.  -- Continue  amiodarone, metoprolol --Restart Xarelto if hemoglobin remains stable 11/14   CHF: Echo from 09/05/2021 showing an EF of 50% with regional wall motion abnormalities, Mildly reduced RV function, mod-severe RV dilation, and moderately elevated pulmonary pressures. There is RA dilation, mild MVR, moderate TVR.  Not fluid overloaded on exam.  On 2 L [place] with O2 sats above 95%. --Continue to monitor closely   CAD: HTN: Patient had an episode of hypotension on 11/14 but pressures have improved to the 100-110s on 11/15. --Continue metoprolol  -- hold metoprolol for HR <60 or BP <100 --Continue midodrine 5 mg daily   Protein-caloric malnutrition: Continue dietary supplementation Depression: Continue mirtazipine 15 mg qHS , buspirone 10 mg   Scarlett Presto, MD Internal Medicine Resident PGY-1 Pager [phone] Please contact the on call pager after 5 pm and on weekends at [phone].

## 2021-10-07 NOTE — Progress Notes (Signed)
  Progress Note   Date: 10/07/2021  Patient Name: Kristen [name]        MRN#: [id number]  Review the patient's clinical findings supports the diagnosis of:   Hyponatremia

## 2021-10-07 NOTE — Evaluation (Addendum)
Physical Therapy Evaluation Patient Details Name: Kristen [name] MRN: [id number] DOB: 02-Oct-1946 Today's Date: 10/07/2021  History of Present Illness  75 yo female presenting to ED on 11/9 with acute on chronic hypoxic respiratory failure likely 2/2 pneumonia. Recent admission and dc to SNF for rehab. PMH including COPD with chronic hypoxic respiratory failure on 3 L/min oxygen at baseline, chronic A-fib, venous insufficiency, and hx of transient HTN.  Clinical Impression  PTA, pt was at [facility] for rehab after recent admission; requiring assist for ADL's and mobility. Pt presents with generalized weakness, decreased skin integrity, poor balance, and decreased cardiopulmonary endurance. Pt requiring two person moderate assist for stand pivot transfers. Will benefit from acute PT to address deficits and maximize functional mobility.      Recommendations for follow up therapy are one component of a multi-disciplinary discharge planning process, led by the attending physician.  Recommendations may be updated based on patient status, additional functional criteria and insurance authorization.  Follow Up Recommendations Skilled nursing-short term rehab (<3 hours/day)    Assistance Recommended at Discharge Frequent or constant Supervision/Assistance  Functional Status Assessment Patient has had a recent decline in their functional status and/or demonstrates limited ability to make significant improvements in function in a reasonable and predictable amount of time  Equipment Recommendations  None recommended by PT    Recommendations for Other Services       Precautions / Restrictions Precautions Precautions: Fall Precaution Comments: Monitor SpO2 Restrictions Weight Bearing Restrictions: No      Mobility  Bed Mobility Overal bed mobility: Needs Assistance Bed Mobility: Supine to Sit     Supine to sit: Min assist;+2 for physical assistance;HOB elevated     General bed mobility  comments: Min A +2 for guiding BLEs towards EOB and then elevating trunk    Transfers Overall transfer level: Needs assistance Equipment used: 2 person hand held assist Transfers: Bed to chair/wheelchair/BSC   Stand pivot transfers: Mod assist;+2 physical assistance;+2 safety/equipment         General transfer comment: Mod A +2 for power up and then maintaiing balance during pivot to recliner. Faitgues quickly    Ambulation/Gait                  Stairs            Wheelchair Mobility    Modified Rankin (Stroke Patients Only)       Balance Overall balance assessment: Needs assistance Sitting-balance support: No upper extremity supported;Feet supported Sitting balance-Leahy Scale: Fair     Standing balance support: Bilateral upper extremity supported;During functional activity Standing balance-Leahy Scale: Poor Standing balance comment: Reliant on UE support and physical A                             Pertinent Vitals/Pain Pain Assessment: Faces Faces Pain Scale: Hurts little more Pain Location: Generalized Pain Descriptors / Indicators: Discomfort Pain Intervention(s): Monitored during session    Home Living Family/patient expects to be discharged to:: Skilled nursing facility                   Additional Comments: [facility] for [facility] rehab after recent admission. 3-4L home O2    Prior Function Prior Level of Function : Needs assist       Physical Assist : ADLs (physical);Mobility (physical) Mobility (physical): Bed mobility;Transfers ADLs (physical): Bathing;Dressing;Toileting;IADLs         Hand Dominance   Dominant Hand:  Right    Extremity/Trunk Assessment   Upper Extremity Assessment Upper Extremity Assessment: Generalized weakness    Lower Extremity Assessment Lower Extremity Assessment: Generalized weakness    Cervical / Trunk Assessment Cervical / Trunk Assessment: Kyphotic (Noting redness at mid back)   Communication   Communication: No difficulties  Cognition Arousal/Alertness: Awake/alert Behavior During Therapy: Anxious Overall Cognitive Status: Within Functional Limits for tasks assessed                                 General Comments: Reports she is feeling axnious about her meeting with palliative earlier today as well as having a visit from her grandson within the hour. Benefits from calm cues        General Comments General comments (skin integrity, edema, etc.): SpO2 88-92% on 2L O2    Exercises General Exercises - Lower Extremity Long Arc Quad: AROM;Both;5 reps;Seated   Assessment/Plan    PT Assessment Patient needs continued PT services  PT Problem List Decreased strength;Decreased mobility;Decreased activity tolerance;Decreased knowledge of use of DME;Cardiopulmonary status limiting activity;Decreased skin integrity;Pain       PT Treatment Interventions DME instruction;Therapeutic activities;Gait training;Therapeutic exercise;Patient/family education;Functional mobility training    PT Goals (Current goals can be found in the Care Plan section)  Acute Rehab PT Goals Patient Stated Goal: to visit with family PT Goal Formulation: With patient Time For Goal Achievement: 10/21/21 Potential to Achieve Goals: Fair    Frequency Min 2X/week   Barriers to discharge        Co-evaluation PT/OT/SLP Co-Evaluation/Treatment: Yes Reason for Co-Treatment: For patient/therapist safety;To address functional/ADL transfers PT goals addressed during session: Mobility/safety with mobility OT goals addressed during session: ADL's and self-care       AM-PAC PT "6 Clicks" Mobility  Outcome Measure Help needed turning from your back to your side while in a flat bed without using bedrails?: A Little Help needed moving from lying on your back to sitting on the side of a flat bed without using bedrails?: A Little Help needed moving to and from a bed to a chair  (including a wheelchair)?: A Lot Help needed standing up from a chair using your arms (e.g., wheelchair or bedside chair)?: A Lot Help needed to walk in hospital room?: Total Help needed climbing 3-5 steps with a railing? : Total 6 Click Score: 12    End of Session   Activity Tolerance: Patient limited by fatigue Patient left: with call bell/phone within reach;in chair;with chair alarm set Nurse Communication: Mobility status PT Visit Diagnosis: Muscle weakness (generalized) (M62.81)    Time: 1337-1410 PT Time Calculation (min) (ACUTE ONLY): 33 min   Charges:   PT Evaluation $PT Eval Moderate Complexity: [place], PT, DPT Acute Rehabilitation Services Pager [phone] Office [phone]   Deno Etienne 10/07/2021, 4:31 PM

## 2021-10-07 NOTE — Progress Notes (Signed)
Patient ID: [name], female   DOB: 10-29-1946, 75 y.o.   MRN: [id number]    Progress Note from the Palliative Medicine Team at [facility]   Patient Name: Kristen [name]        Date: 10/07/2021 DOB: 12/30/1945  Age: 75 y.o. MRN#: [id number] Attending Physician: Angelica Pou, MD Primary Care Physician: [name], MD Admit Date: 10/01/2021   Medical records reviewed   Kristen [name] is a 75 yo female with history of COPD, severe protein malnutrition with BMI currently 13 who has a right sided consolidated pneumonia complicated by large parpneumonic effusion that is freeflowing without loculations. Suspected aspiration pneumonia and less likely lung cancer as she had a CT scan in April without a mass. She is not a candidate for surgical intervention or chest tube placement. She is being medically managed.  Patient has had continued physical and functional decline over the past many months.  He has had 3 recent hospitalizations.   This NP visited patient at the bedside as scheduled with  patient's sister [name]  and niece [name] # [phone].  Continued conversation regarding current medical situation; diagnosis, gnosis, goals of care, treatment option decisions, advanced care planning, and anticipatory care needs.  Education offered regarding natural trajectory of severe COPD and adult failure to thrive.  We discussed the impact of mobility on overall wellness.  Education offered on the difference between an aggressive medical intervention path and a palliative comfort path for this patient this time in this situation.  Education offered on hospice philosophy and hospice benefit.  Attempted to elicit patient's values and goals of care.   Patient is hoping to see her grandson today who is supposed to visit this afternoon.  Discussed with patient the importance of continued conversation with her family and the medical providers regarding overall plan of care and  treatment options,  ensuring decisions are within the context of the patients values and GOCs.  Education offered on the importance of documentation of advanced care planning so that patient care moving forward can be patient centered and goal directed  Questions and concerns addressed     Total time spent on the unit was 35 minutes  Greater than 50% of the time was spent in counseling and coordination of care  Wadie Lessen NP  Palliative Medicine Team Team Phone # [phone] Pager [phone]

## 2021-10-07 NOTE — Evaluation (Signed)
Occupational Therapy Evaluation Patient Details Name: Kristen [name] MRN: [id number] DOB: October 20, 1946 Today's Date: 10/07/2021   History of Present Illness 75 yo female presenting to ED on 11/9 with acute on chronic hypoxic respiratory failure likely 2/2 pneumonia. Recent admission and dc to SNF for rehab. PMH including COPD with chronic hypoxic respiratory failure on 3 L/min oxygen at baseline, chronic A-fib, venous insufficiency, and hx of transient HTN.   Clinical Impression   PTA, pt was at [facility] for rehab after recent admission; pt requiring assistance for ADLs and transfers. Pt currently requiring Mod A for UB ADLs, Max A for LB ADLs, and Mod A +2 for functional transfers. Pt presenting with decreased activity tolerance with significant fatigue. Despite fatigue, pt motivated to participate in therapy. Pt reporting anxious this session after her meeting with palliative.  Pt would benefit from further acute OT to facilitate safe dc. Recommend dc to SNF for further OT to optimize safety, independence with ADLs, and return to PLOF.      Recommendations for follow up therapy are one component of a multi-disciplinary discharge planning process, led by the attending physician.  Recommendations may be updated based on patient status, additional functional criteria and insurance authorization.   Follow Up Recommendations  Skilled nursing-short term rehab (<3 hours/day)    Assistance Recommended at Discharge Frequent or constant Supervision/Assistance  Functional Status Assessment  Patient has had a recent decline in their functional status and/or demonstrates limited ability to make significant improvements in function in a reasonable and predictable amount of time  Equipment Recommendations  Other (comment) (Defer to next venue)    Recommendations for Other Services PT consult     Precautions / Restrictions Precautions Precautions: Fall Precaution Comments: Monitor SpO2       Mobility Bed Mobility Overal bed mobility: Needs Assistance Bed Mobility: Supine to Sit     Supine to sit: Min assist;+2 for physical assistance;HOB elevated     General bed mobility comments: Min A +2 for guiding BLEs towards EOB and then elevating trunk    Transfers Overall transfer level: Needs assistance Equipment used: 2 person hand held assist Transfers: Bed to chair/wheelchair/BSC   Stand pivot transfers: Mod assist;+2 physical assistance;+2 safety/equipment         General transfer comment: Mod A +2 for power up and then maintaiing balance during pivot to recliner. Faitgues quickly      Balance Overall balance assessment: Needs assistance Sitting-balance support: No upper extremity supported;Feet supported Sitting balance-Leahy Scale: Fair     Standing balance support: Bilateral upper extremity supported;During functional activity Standing balance-Leahy Scale: Poor Standing balance comment: Reliant on UE support and physical A                           ADL either performed or assessed with clinical judgement   ADL Overall ADL's : Needs assistance/impaired Eating/Feeding: Set up;Supervision/ safety;Sitting   Grooming: Minimal assistance;Sitting Grooming Details (indicate cue type and reason): Assist with pinning up hair while seated at EOB Upper Body Bathing: Moderate assistance;Sitting   Lower Body Bathing: Maximal assistance;Sit to/from stand   Upper Body Dressing : Moderate assistance;Sitting   Lower Body Dressing: Maximal assistance;Sit to/from stand   Toilet Transfer: Moderate assistance;+2 for physical assistance;+2 for safety/equipment;Stand-pivot (simulated to recliner) Toilet Transfer Details (indicate cue type and reason): Mod A +2 for power up and then maintaiing balance during pivot to recliner. Faitgues quickly  Functional mobility during ADLs: Moderate assistance;+2 for physical assistance;+2 for safety/equipment  (stand pivot only) General ADL Comments: Pt presenting with     Vision         Perception     Praxis      Pertinent Vitals/Pain Pain Assessment: Faces Faces Pain Scale: Hurts little more Pain Location: Generalized Pain Descriptors / Indicators: Discomfort Pain Intervention(s): Monitored during session;Limited activity within patient's tolerance;Repositioned     Hand Dominance Right   Extremity/Trunk Assessment Upper Extremity Assessment Upper Extremity Assessment: Generalized weakness   Lower Extremity Assessment Lower Extremity Assessment: Defer to PT evaluation   Cervical / Trunk Assessment Cervical / Trunk Assessment: Kyphotic (Noting redness at mid back)   Communication Communication Communication: No difficulties   Cognition Arousal/Alertness: Awake/alert Behavior During Therapy: Anxious Overall Cognitive Status: Within Functional Limits for tasks assessed                                 General Comments: Reports she is feeling axnious about her meeting with palliative earlier today as well as having a visit from her grandson within the hour. Benefits from calm cues     General Comments  SpO2 90s on 3L via [place].    Exercises Exercises: General Lower Extremity General Exercises - Lower Extremity Long Arc Quad: AROM;Both;5 reps;Seated   Shoulder Instructions      Home Living Family/patient expects to be discharged to:: Skilled nursing facility                                 Additional Comments: [facility] for [facility] re[facility] after recent admission. 3-4L home O2      Prior Functioning/Environment Prior Level of Function : Needs assist       Physical Assist : ADLs (physical);Mobility (physical) Mobility (physical): Bed mobility;Transfers ADLs (physical): Bathing;Dressing;Toileting;IADLs            OT Problem List: Decreased strength;Decreased activity tolerance;Impaired balance (sitting and/or standing);Decreased  knowledge of use of DME or AE;Decreased knowledge of precautions;Cardiopulmonary status limiting activity;Pain      OT Treatment/Interventions: Self-care/ADL training;Energy conservation;DME and/or AE instruction;Therapeutic exercise;Therapeutic activities;Patient/family education;Balance training    OT Goals(Current goals can be found in the care plan section) Acute Rehab OT Goals Patient Stated Goal: Get stronger OT Goal Formulation: With patient Time For Goal Achievement: 10/21/21 Potential to Achieve Goals: Good ADL Goals Pt Will Perform Grooming: with set-up;with supervision;sitting Pt Will Perform Upper Body Dressing: with set-up;with supervision;sitting Pt Will Transfer to Toilet: with min assist;bedside commode;ambulating Pt Will Perform Toileting - Clothing Manipulation and hygiene: with min guard assist;sit to/from stand;sitting/lateral leans Pt/caregiver will Perform Home Exercise Program: Increased ROM;Increased strength;Both right and left upper extremity;With Supervision;With written HEP provided;With theraband  OT Frequency: Min 2X/week   Barriers to D/C:            Co-evaluation PT/OT/SLP Co-Evaluation/Treatment: Yes Reason for Co-Treatment: For patient/therapist safety;To address functional/ADL transfers   OT goals addressed during session: ADL's and self-care      AM-PAC OT "6 Clicks" Daily Activity     Outcome Measure Help from another person eating meals?: A Little Help from another person taking care of personal grooming?: A Little Help from another person toileting, which includes using toliet, bedpan, or urinal?: A Lot Help from another person bathing (including washing, rinsing, drying)?: A Lot Help from another person to put  on and taking off regular upper body clothing?: A Little Help from another person to put on and taking off regular lower body clothing?: A Lot 6 Click Score: 15   End of Session Equipment Utilized During Treatment: Oxygen Nurse  Communication: Mobility status;Other (comment) (found pill in her bed; requested air mattress; noted redness on back)  Activity Tolerance: Patient tolerated treatment well Patient left: in chair;with call bell/phone within reach;with chair alarm set  OT Visit Diagnosis: Unsteadiness on feet (R26.81);Pain;Muscle weakness (generalized) (M62.81)                Time: 9242-6834 OT Time Calculation (min): 38 min Charges:  OT General Charges $OT Visit: 1 Visit OT Evaluation $OT Eval Moderate Complexity: 1 Mod OT Treatments $Self Care/Home Management : 8-22 mins  Amilcar Reever MSOT, OTR/L Acute Rehab Pager: [phone] Office: [place] 10/07/2021, 2:34 PM

## 2021-10-07 NOTE — Progress Notes (Signed)
  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

## 2021-10-08 DIAGNOSIS — I5042 Chronic combined systolic (congestive) and diastolic (congestive) heart failure: Secondary | ICD-10-CM

## 2021-10-08 DIAGNOSIS — E871 Hypo-osmolality and hyponatremia: Secondary | ICD-10-CM

## 2021-10-08 LAB — CULTURE, BODY FLUID W GRAM STAIN -BOTTLE: Culture: NO GROWTH

## 2021-10-08 NOTE — Discharge Summary (Addendum)
Name: Kristen [name] MRN: [id number] DOB: 1945-12-08 75 y.o. PCP: Kristen Dame, MD  Date of Admission: 10/01/2021  1:01 PM Date of Discharge: 10/09/2021 11:55 PM Attending Physician: No att. providers found  Discharge Diagnosis: 1. Acute on Chronic Hypoxic Respiratory Failure 2. CAP 3. RUL Heterogenous consolidation with mediastinal LAD 4. Moderate Rt Pleural Effusion: 5. COPD (Gold 4) 6. Normocytic Anemia 7. Persistent Atrial Fibrillation 8. 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 CAD 10. HTN 11. Protein-caloric malnutrition 12. Depression  Discharge Medications: Allergies as of 10/09/2021       Reactions   Gabapentin Other (See Comments)   Hallucination/ nightmares        Medication List     STOP taking these medications    cefTRIAXone 1 g injection Commonly known as: ROCEPHIN       TAKE these medications    acetaminophen 325 MG tablet Commonly known as: TYLENOL Take 325 mg by mouth every 6 (six) hours as needed for moderate pain.   albuterol 108 (90 Base) MCG/ACT inhaler Commonly known as: VENTOLIN HFA INHALE 1-2 PUFFS BY MOUTH EVERY 6 HOURS AS NEEDED FOR WHEEZE OR SHORTNESS OF BREATH What changed: See the new instructions.   amiodarone 200 MG tablet Commonly known as: PACERONE TAKE 1 TABLET BY MOUTH EVERY DAY   amoxicillin-clavulanate 875-125 MG tablet Commonly known as: Augmentin Take 1 tablet by mouth 2 (two) times daily for 1 day.   busPIRone 10 MG tablet Commonly known as: BUSPAR Take 1 tablet (10 mg total) by mouth daily.   clopidogrel 75 MG tablet Commonly known as: PLAVIX Take 1 tablet (75 mg total) by mouth daily.   docusate sodium 100 MG capsule Commonly known as: COLACE Take 1 capsule (100 mg total) by mouth 2 (two) times daily as needed for mild constipation.   Dulera 200-5 MCG/ACT Aero Generic drug: mometasone-formoterol Inhale 1 puff into the lungs in the morning and at bedtime.   feeding supplement  Liqd Take 237 mLs by mouth 2 (two) times daily between meals.   ferrous sulfate 324 MG Tbec Take 324 mg by mouth daily with breakfast.   fluticasone 50 MCG/ACT nasal spray Commonly known as: FLONASE Place 1 spray into both nostrils daily as needed for allergies or rhinitis.   Incruse Ellipta 62.5 MCG/ACT Aepb Generic drug: umeclidinium bromide INHALE 1 PUFF BY MOUTH EVERY DAY What changed:  how much to take how to take this when to take this additional instructions   ipratropium-albuterol 0.5-2.5 (3) MG/3ML Soln Commonly known as: DUONEB TAKE 3 MLS BY NEBULIZATION EVERY 6 (SIX) HOURS AS NEEDED (SHORTNESS OF BREATH/WHEEZING.).   ketoconazole 2 % cream Commonly known as: NIZORAL Apply 1 application topically daily.   magnesium oxide 400 MG tablet Commonly known as: MAG-OX Take 400 mg by mouth daily.   metoprolol succinate 25 MG 24 hr tablet Commonly known as: TOPROL-XL Take 1 tablet (25 mg total) by mouth daily. What changed: when to take this   midodrine 5 MG tablet Commonly known as: PROAMATINE Take 1 tablet (5 mg total) by mouth 3 (three) times daily with meals.   mirtazapine 15 MG tablet Commonly known as: REMERON Take 1 tablet (15 mg total) by mouth at bedtime as needed (sleep). What changed: when to take this   multivitamin with minerals Tabs tablet Take 1 tablet by mouth daily.   ondansetron 4 MG tablet Commonly known as: ZOFRAN TAKE 1 TABLET BY MOUTH DAILY AS NEEDED FOR NAUSEA OR VOMITING. What  changed:  how much to take how to take this when to take this reasons to take this additional instructions   oxyCODONE 5 MG immediate release tablet Commonly known as: Oxy IR/ROXICODONE Take 5 mg by mouth every 4 (four) hours as needed for moderate pain.   oxyCODONE-acetaminophen 10-325 MG tablet Commonly known as: Percocet Take 1 tablet by mouth every 8 (eight) hours as needed for pain.   pantoprazole 40 MG tablet Commonly known as: PROTONIX Take 1  tablet (40 mg total) by mouth daily at 6 (six) AM.   polyethylene glycol 17 g packet Commonly known as: MIRALAX / GLYCOLAX Take 17 g by mouth daily as needed for moderate constipation.   PRESCRIPTION MEDICATION Take 30 mLs by mouth 2 (two) times daily. Medication: Pro-Stat AWC (amino acids-protein hydrolys) 17-100 gram-kal/30 ml liquid   Xarelto 15 MG Tabs tablet Generic drug: Rivaroxaban TAKE 1 TABLET (15 MG TOTAL) BY MOUTH DAILY. What changed: how much to take               Discharge Care Instructions  (From admission, onward)           Start     Ordered   10/09/21 0000  Discharge wound care:       Comments: Cleanse wounds to bilateral lateral malleolus wounds with NS and pat dry. Apply Silicone foam dressing.  Change every three days.  Cleanse sacral wound with NS and pat dry. Apply sacral foam dressing.  Change every three days and PRN soilage Turn and reposition every two hours to offload pressure.  Will implement low air loss mattress   10/09/21 1401            Disposition and follow-up:   [name] was discharged from [facility] in Stable condition.  At the hospital follow up visit please address:  1.  Acute on Chronic Hypoxic Respiratory Failure; CAP; RUL Heterogenous consolidation with mediastinal LAD; Moderate Rt  Pleural Effusion; COPD (Gold 4)- continue goals of care discussion and monitor patient's respiratory status on the antibiotics. Patient was maintaining oxygen saturation well upon discharge. Patient to finish 10 day course of antibiotics on 11/18 with 1 day of augmentin after discharge  2. Anemia- patient received a transfusion during hospitalization. Recheck hemoglobin.  3. 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 HTN- patient has intermittent hypotension requiring midodrine. Assess BP in the ambulatory setting. Metoprolol was changed from BID to once daily. Adjust BP  medications as needed.  5. Protein-caloric malnutrition- assess that patient is still receiving adequate dietary supplementation  6.  Labs / imaging needed at time of follow-up: CBC, BMP, needs repeat lung imaging in 4-6 weeks to ensure resolution of infiltrate  7.  Pending labs/ test needing follow-up: n/a  Follow-up Appointments:  Contact information for after-discharge care     Destination     [facility] .   Service: Skilled Nursing Contact information: [place] [facility] Course by problem list: 1. Acute on Chronic Hypoxic Respiratory Failure; CAP; RUL Heterogenous consolidation with mediastinal LAD; Moderate Rt Pleural Effusion; COPD (Gold 4) Patient experienced increasing dyspnea on exertion and productive cough for 48 hours. Outpatient XR showed RUL infiltrate and she was given IM rocephin and her baseline oxygen was increased. However patient did not improve and  she was sent to the ED. CT chest was obtained with showed RUL heterogenous mass like consolidation, moderate volume right pleural effusion, and mediastinal LAD. With her significant history of tobacco this raised concern for possible malignancy with post-obstructive pneumonia. However given patient's frailty she would be a poor candidate for any invasive procedures. She was initially started on vanc and cefepime and a thoracentesis was performed by IR removing 100cc of exudative fluid. MRSA nares came back negative so vanc was d/c and cefepime was switched to zosyn and then this was switched to ceftriaxone. Atypical cells, likely reactive, were seen on cytology. WBC trended down and Bcx remained negative. Patient returned to her baseline oxygen requirement of 2 L. Home albuterol, mometasone-formoterol, umeclidinium bromide were all continued. Patient discharged after finishing 9 days of a 10 day course of antibiotics, to finish  with 1 day of augmentin ending on 11/18.  2. Normocytic Anemia: Patient has a history of normocytic anemia with a baseline Hgb of around 11-12 until this years when is has dropped considerably to the 8-9 range. On admission to the hospital hemoglobin was low at 6.7. She received 1 unit PRBC and hemoglobin responded appropriately. Xarelto was held given decrease in hemoglobin but once hemoglobin was stable xarelto was restarted on 11/17.   3. Persistent Atrial Fibrillation (CHA2DS2-VASc 6, and HAS-BLED 3) Patient was in sinus rhythm with normal rate, but had some has had lower blood pressures and metoprolol succinate was decreased frin 25 mg BID to once daily. Amiodarone 200 mg daily was continued. Xarelto was held as above.  4. 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 Patient's home midodrine 5 for intermittent low blood pressures was also continued daily   5. Protein-caloric malnutrition: Dietary supplementation was continued.  6. Hyponatremia Problem was chronic in nature and monitored with BMPs. No intervention required.   7. Depression:  Home mirtazipine 15 mg and buspirone 10 mg were continued.  Discharge Exam:   BP 134/70 (BP Location: Right Arm)   Pulse 60   Temp 98.6 F (37 C) (Oral)   Resp 18   Ht _0  (1.803 m)   Wt 45.1 kg   SpO2 98%   BMI 13.87 kg/m  Discharge exam:  Constitutional: Very cachectic elderly woman in NAD Pulmonary/Chest: Pulmonary cachexia. normal WOB on 2 L [place], able to speak in full sentences, coarse breath sounds Abdominal: Soft. scaphoid MSK: Decreased muscle mass. Neurological: Alert and oriented x3.  No focal deficits. Skin: heel breakdown with bandages c/d/I, decreased skin turgor Psych: normal mood and affect   Pertinent Labs, Studies, and Procedures:   CBC Latest Ref Rng & Units 10/09/2021 10/07/2021  10/06/2021  WBC 4.0 - 10.5 K/uL 14.4(H) 16.4(H) 16.4(H)  Hemoglobin 12.0 - 15.0 g/dL 8.8(L) 8.6(L) 9.2(L)  Hematocrit 36.0 - 46.0 % 28.1(L) 27.5(L) 29.6(L)  Platelets 150 - 400 K/uL 558(H) 552(H) 640(H)   BMP Latest Ref Rng & Units 10/07/2021 10/06/2021 10/05/2021  Glucose 70 - 99 mg/dL 89 81 95  BUN 8 - 23 mg/dL _1 Creatinine 0.44 - 1.00 mg/dL 0.75 0.84 0.69  BUN/Creat Ratio 12 - 28 - - -  Sodium 135 - 145 mmol/L 129(L) 131(L) 132(L)  Potassium 3.5 - 5.1 mmol/L 4.0 4.0 4.1  Chloride 98 - 111 mmol/L 93(L) 93(L) 95(L)  CO2 22 - 32 mmol/L 28 32 30  Calcium 8.9 -  10.3 mg/dL 7.9(L) 8.1(L) 8.4(L)    DG Chest 1 View  Result Date: 10/03/2021 CLINICAL DATA:  Status post thoracentesis. EXAM: CHEST  1 VIEW COMPARISON:  10/02/2021 FINDINGS: There continues to be marked opacification throughout the right hemithorax. Small areas of aeration noted in the right lower chest and lateral right upper chest. These areas are compatible with air bronchograms. Heart and mediastinum are grossly stable. Left lung is aerated without acute abnormality. IMPRESSION: 1. Persistent opacification throughout the right hemithorax compatible with chronic consolidation and suspect a trapped lung. 2. Slightly decreased opacification in the right lower chest is compatible with recent thoracentesis. Difficult to exclude some pleural air but no evidence for a large pneumothorax. Electronically Signed   By: Markus Daft M.D.   On: 10/03/2021 09:28   CT Chest W Contrast  Result Date: 10/01/2021 CLINICAL DATA:  Pneumonia, effusion or abscess suspected EXAM: CT CHEST WITH CONTRAST TECHNIQUE: Multidetector CT imaging of the chest was performed during intravenous contrast administration. CONTRAST:  62m OMNIPAQUE IOHEXOL 300 MG/ML  SOLN COMPARISON:  CT chest 02/22/2021 FINDINGS: Cardiovascular: Normal heart size. No significant pericardial effusion. The thoracic aorta is normal in caliber. Mild atherosclerotic plaque of the  thoracic aorta. At least 2 vessel coronary artery calcifications. No central or segmental pulmonary embolus. Mild left to right mediastinal shift. Mediastinum/Nodes: Enlarged precarinal lymph node measuring 1.3 cm. Enlarged subcarinal lymph node measuring 1.2 cm. Enlarged right paratracheal lymph node measuring 1.2 cm. No enlarged left hilar or axillary lymph nodes. Thyroid gland, trachea, and esophagus demonstrate no significant findings. Lungs/Pleura: Heterogeneous right upper lobe masslike consolidation involving the entire right upper lobe. Passive atelectasis of the right middle and lower lobe with partial collapse. Interval development of a moderate volume right pleural effusion. Nonvisualization of the right bronchials likely due to obstruction or collapse. Severe emphysematous changes of the aerated left lung. Trace left pleural effusion. Passive atelectasis of the left lower lobe. Upper Abdomen: No acute abnormality. Musculoskeletal: No chest wall abnormality. No suspicious lytic or blastic osseous lesions. No acute displaced fracture. IMPRESSION: 1. Complete opacification of the right upper lobe with a heterogeneous masslike consolidation. Interval development of a moderate volume right pleural effusion. 2. Mediastinal lymphadenopathy. 3. Trace left pleural effusion. 4. Aortic Atherosclerosis (ICD10-I70.0) and Emphysema (ICD10-J43.9). Electronically Signed   By: MIven FinnM.D.   On: 10/01/2021 16:49   DG CHEST PORT 1 VIEW  Result Date: 10/03/2021 CLINICAL DATA:  Right thoracentesis cough EXAM: PORTABLE CHEST 1 VIEW COMPARISON:  Previous studies including the examination of 10/03/2021 FINDINGS: There is improvement in aeration of right lower lung fields following thoracentesis still, there is almost complete opacification of right upper lung fields and partial opacification of right lower lung fields. Left lung remains clear. There is minimal blunting of left lateral CP angle. There is no  definite pneumothorax. There is small faint focus of lucency in the lateral aspect of right apex which was also evident in the previous study. IMPRESSION: There is interval decrease in amount of right pleural effusion after thoracentesis. There is no demonstrable pneumothorax. Still, right hemithorax is mostly opacified suggesting presence of large residual pleural effusion and possibly underlying atelectasis/pneumonia. Small left pleural effusion is present. Electronically Signed   By: PElmer PickerM.D.   On: 10/03/2021 13:35   DG CHEST PORT 1 VIEW  Result Date: 10/02/2021 CLINICAL DATA:  Shortness of breath EXAM: PORTABLE CHEST 1 VIEW COMPARISON:  10/01/2021 05/27/2021 FINDINGS: Completely opacified right thorax with worsened aeration at right base.  Obscured cardiomediastinal silhouette. Possible small left effusion. IMPRESSION: 1. Completely opacified right thorax consistent with pleural effusion and underlying airspace disease and or mass, aeration is worsened compared to yesterday's radiograph 2. Possible small left effusion with mild airspace disease at left base Electronically Signed   By: Donavan Foil M.D.   On: 10/02/2021 19:21   DG Chest Port 1 View  Result Date: 10/01/2021 CLINICAL DATA:  Sepsis. EXAM: PORTABLE CHEST 1 VIEW COMPARISON:  September 05, 2021. FINDINGS: Stable cardiomediastinal silhouette. Significant increased right upper lobe opacity is noted concerning for worsening pneumonia. Left lung is clear. Right basilar atelectasis and effusion may be present. Bony thorax is unremarkable. IMPRESSION: Significantly increased right upper lobe consolidation is noted concerning for worsening pneumonia, although underlying mass cannot be excluded. CT scan is recommended for further evaluation. Electronically Signed   By: Marijo Conception M.D.   On: 10/01/2021 14:06   IR THORACENTESIS ASP PLEURAL SPACE W/IMG GUIDE  Result Date: 10/03/2021 INDICATION: History of COPD with right-sided  pleural effusion. Request for diagnostic and therapeutic thoracentesis. EXAM: ULTRASOUND GUIDED DIAGNOSTIC AND THERAPEUTIC THORACENTESIS MEDICATIONS: 51m 1% lidocaine COMPLICATIONS: None immediate. PROCEDURE: An ultrasound guided thoracentesis was thoroughly discussed with the patient and questions answered. The benefits, risks, alternatives and complications were also discussed. The patient understands and wishes to proceed with the procedure. Written consent was obtained. Ultrasound was performed to localize and mark an adequate pocket of fluid in the right chest. The area was then prepped and draped in the normal sterile fashion. 1% Lidocaine was used for local anesthesia. Under ultrasound guidance a 6 Fr Safe-T-Centesis catheter was introduced. Thoracentesis was performed. The catheter was removed and a dressing applied. FINDINGS: A total of approximately 900 ml of hazy, amber fluid was removed. Samples were sent to the laboratory as requested by the clinical team. IMPRESSION: Successful ultrasound guided right thoracentesis yielding 900 mL of pleural fluid. Read by: SNarda Rutherford NP Electronically Signed   By: AMarkus DaftM.D.   On: 10/03/2021 09:29    Discharge Instructions: Discharge Instructions     Call MD for:  difficulty breathing, headache or visual disturbances   Complete by: As directed    Call MD for:  temperature >100.4   Complete by: As directed    Diet - low sodium heart healthy   Complete by: As directed    Discharge wound care:   Complete by: As directed    Cleanse wounds to bilateral lateral malleolus wounds with NS and pat dry. Apply Silicone foam dressing.  Change every three days.  Cleanse sacral wound with NS and pat dry. Apply sacral foam dressing.  Change every three days and PRN soilage Turn and reposition every two hours to offload pressure.  Will implement low air loss mattress   Increase activity slowly   Complete by: As directed        Signed: NAldine Contes  MD 10/10/2021, 2:11 PM   [id number]

## 2021-10-08 NOTE — NC FL2 (Signed)
[place] LEVEL OF CARE SCREENING TOOL     IDENTIFICATION  Patient Name: Kristen [name] Birthdate: 06-11-1946 Sex: female Admission Date (Current Location): 10/01/2021  [facility] and [place] Number:  Herbalist and Address:  [facility], [place] [address], [place], [place] [address]      Provider Number: [id number]  Attending Physician Name and Address:  Angelica Pou, MD  Relative Name and Phone Number:  [name] (sister) [phone] or [phone]    Current Level of Care: Hospital Recommended Level of Care: [place] Prior Approval Number:    Date Approved/Denied:   PASRR Number: [id number]  Discharge Plan: SNF    Current Diagnoses: Patient Active Problem List   Diagnosis Date Noted   HAP (hospital-acquired pneumonia) 10/01/2021   Elevated troponin    Sepsis ([place]) 09/03/2021   Protein-calorie malnutrition, severe 09/03/2021   Community acquired pneumonia 09/01/2021   Diarrhea 09/01/2021   Chronic respiratory failure with hypoxia ([place]) 09/01/2021   Hypotension    Pressure injury of skin 06/08/2021   CAP (community acquired pneumonia) 05/29/2021   Aspiration pneumonia ([place]) 05/29/2021   Atrial fibrillation with RVR ([place]) 05/27/2021   Cellulitis of left lower extremity 05/06/2021   Aortic atherosclerosis ([place]) 03/07/2021   Opiate use 03/05/2021   Need for assessment by dentistry for poor dentition 03/05/2021   Abdominal pain 02/06/2021   Health care maintenance 12/27/2020   AF (paroxysmal atrial fibrillation) ([place]) 12/16/2020   Nausea 11/07/2020   Lower extremity ulceration ([place]) 10/04/2020   Generalized anxiety disorder 08/27/2020   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[place])    Smoking 07/18/2020   Venous insufficiency of both lower extremities 07/18/2020   Acute respiratory failure with hypoxia ([place]) 07/01/2020   COPD (chronic obstructive pulmonary disease) ([place]) 06/28/2020   Protein-calorie  malnutrition ([place]) 06/28/2020   Hyponatremia 06/28/2020    Orientation RESPIRATION BLADDER Height & Weight     Self, Time, Situation, Place  O2 (2 liters) Incontinent, External catheter Weight: 99 lb 6.8 oz (45.1 kg) Height:  5' 11" (180.3 cm)  BEHAVIORAL SYMPTOMS/MOOD NEUROLOGICAL BOWEL NUTRITION STATUS      Incontinent Diet (Regular)  AMBULATORY STATUS COMMUNICATION OF NEEDS Skin   Total Care (Patient was unable to ambulate with PT during eval on 11/15l) Verbally Other (Comment) (Chronrenous ulcers on bilateral malleoli, lateral aspects; pressure injury coccyx-medial, lower - stage 2 with foam dressing; abrasions bilateral knee and leg; Ecchymosis bilateral arm; right great toe with scabbed lesion)                       Personal Care Assistance Level of Assistance  Bathing, Feeding, Dressing Bathing Assistance: Maximum assistance Feeding assistance: Limited assistance (Assistance with set-up) Dressing Assistance: Maximum assistance     Functional Limitations Info  Sight, Hearing, Speech Sight Info: Adequate Hearing Info: Adequate Speech Info: Adequate    SPECIAL CARE FACTORS FREQUENCY  PT (By licensed PT), OT (By licensed OT)     PT Frequency: PT eval 11/15. PT at SNF eval and treat a minimum of 5 days per week OT Frequency: OT eval 11/15. OT at SNF eval and treat, a minimum of 5 days per week            Contractures Contractures Info: Not present    Additional Factors Info  Code Status, Allergies (Gabapentin) Code Status Info: DNR Allergies Info: Gabapentin           Current Medications (10/08/2021):  This is the current hospital active medication list Current Facility-Administered Medications  Medication Dose Route Frequency Provider Last Rate Last Admin   0.9 %  sodium chloride infusion  250 mL Intravenous PRN Norins, Heinz Knuckles, MD       acetaminophen (TYLENOL) tablet 325 mg  325 mg Oral Q6H PRN Norins, Heinz Knuckles, MD       albuterol (PROVENTIL) (2.5  MG/3ML) 0.083% nebulizer solution 3 mL  3 mL Inhalation Q6H PRN Norins, Heinz Knuckles, MD   3 mL at 10/08/21 1706   amiodarone (PACERONE) tablet 200 mg  200 mg Oral Daily Norins, Heinz Knuckles, MD   200 mg at 10/08/21 0854   busPIRone (BUSPAR) tablet 10 mg  10 mg Oral Daily Norins, Heinz Knuckles, MD   10 mg at 10/08/21 0840   clopidogrel (PLAVIX) tablet 75 mg  75 mg Oral Daily Norins, Heinz Knuckles, MD   75 mg at 10/08/21 0840   docusate sodium (COLACE) capsule 100 mg  100 mg Oral BID PRN Norins, Heinz Knuckles, MD       feeding supplement (ENSURE ENLIVE / ENSURE PLUS) liquid 237 mL  237 mL Oral TID BM Samtani, Jai-Gurmukh, MD   237 mL at 10/08/21 1500   fluticasone (FLONASE) 50 MCG/ACT nasal spray 1 spray  1 spray Each Nare Daily PRN Norins, Heinz Knuckles, MD       guaiFENesin tablet 200 mg  200 mg Oral Q4H Marianna Payment, MD   200 mg at 10/08/21 1705   ipratropium-albuterol (DUONEB) 0.5-2.5 (3) MG/3ML nebulizer solution 3 mL  3 mL Nebulization Q6H PRN Norins, Heinz Knuckles, MD   3 mL at 10/07/21 2024   lidocaine (PF) (XYLOCAINE) 1 % injection    PRN Narda Rutherford T, NP   10 mL at 10/03/21 0925   magnesium oxide (MAG-OX) tablet 200 mg  200 mg Oral Daily Norins, Heinz Knuckles, MD   200 mg at 10/08/21 7893   MEDLINE mouth rinse  15 mL Mouth Rinse BID Nita Sells, MD   15 mL at 10/07/21 2046   metoprolol succinate (TOPROL-XL) 24 hr tablet 25 mg  25 mg Oral Daily Lacinda Axon, MD   25 mg at 10/08/21 0840   midodrine (PROAMATINE) tablet 5 mg  5 mg Oral TID WC Norins, Heinz Knuckles, MD   5 mg at 10/08/21 1705   mirtazapine (REMERON) tablet 15 mg  15 mg Oral QHS PRN Norins, Heinz Knuckles, MD   15 mg at 10/04/21 2118   mometasone-formoterol (DULERA) 200-5 MCG/ACT inhaler 1 puff  1 puff Inhalation BID Norins, Heinz Knuckles, MD   1 puff at 10/08/21 0815   multivitamin with minerals tablet 1 tablet  1 tablet Oral Daily Nita Sells, MD   1 tablet at 10/08/21 0839   ondansetron (ZOFRAN) tablet 4 mg  4 mg Oral Q8H PRN Norins, Heinz Knuckles, MD   4 mg at 10/06/21 2245   oxyCODONE (Oxy IR/ROXICODONE) immediate release tablet 5 mg  5 mg Oral Q6H PRN Lacinda Axon, MD   5 mg at 10/08/21 0840   oxyCODONE-acetaminophen (PERCOCET/ROXICET) 5-325 MG per tablet 1 tablet  1 tablet Oral Q6H Lacinda Axon, MD   1 tablet at 10/08/21 1214   pantoprazole (PROTONIX) EC tablet 40 mg  40 mg Oral Q0600 Neena Rhymes, MD   40 mg at 10/08/21 0604   piperacillin-tazobactam (ZOSYN) IVPB 3.375 g  3.375 g Intravenous Q8H Skeet Simmer, RPH 12.5 mL/hr at 10/08/21 1218 3.375 g  at 10/08/21 1218   sodium chloride flush (NS) 0.9 % injection 3 mL  3 mL Intravenous Q12H Norins, Heinz Knuckles, MD   3 mL at 10/06/21 2239   sodium chloride flush (NS) 0.9 % injection 3 mL  3 mL Intravenous PRN Norins, Heinz Knuckles, MD       umeclidinium bromide (INCRUSE ELLIPTA) 62.5 MCG/ACT 1 puff  1 puff Inhalation Daily Norins, Heinz Knuckles, MD   1 puff at 10/08/21 1607     Discharge Medications: Please see discharge summary for a list of discharge medications.  Relevant Imaging Results:  Relevant Lab Results:   Additional Information ss# [id number]  Sable Feil, LCSW

## 2021-10-08 NOTE — Progress Notes (Addendum)
HD#7 Subjective:  Overnight Events: NAEO   Patient saw her daughter and her grandson yesterday which was really nice. She was able to meet her grandson's girlfriend for the first time. She really likes her so far. She says that she is breathing just fine and that she just ate lunch.  Objective:  Vital signs in last 24 hours: Vitals:   10/07/21 2050 10/07/21 2050 10/08/21 0006 10/08/21 0517  BP: 129/63 129/63 (!) 102/58 (!) 106/58  Pulse: 72 72 65 60  Resp: _0 Temp: 98 F (36.7 C) 98 F (36.7 C)  98.4 F (36.9 C)  TempSrc: Oral     SpO2: 98% 92% 93% 98%  Weight:      Height:       Supplemental O2: Room Air SpO2: 98 % O2 Flow Rate (L/min): 2 L/min FiO2 (%): 28 %   Physical Exam:  Constitutional: Very cachectic elderly woman in NAD Pulmonary/Chest: Pulmonary cachexia. normal WOB on 2 L [place], able to speak in full sentences, coarse breath sounds Abdominal: Soft.  scaphoid MSK: Decreased muscle mass. Neurological: Alert and oriented x3.  No focal deficits. Skin: heel breakdown with bandages c/d/I, decreased skin turgor Psych: normal mood and affect    Filed Weights   10/01/21 1313 10/02/21 2300  Weight: 44.5 kg 45.1 kg     Intake/Output Summary (Last 24 hours) at 10/08/2021 0641 Last data filed at 10/08/2021 0500 Gross per 24 hour  Intake 1246.37 ml  Output 2225 ml  Net -978.63 ml   Net IO Since Admission: 3,309.95 mL [10/08/21 0641]  Pertinent Labs: CBC Latest Ref Rng & Units 10/07/2021 10/06/2021 10/05/2021  WBC 4.0 - 10.5 K/uL 16.4(H) 16.4(H) 16.5(H)  Hemoglobin 12.0 - 15.0 g/dL 8.6(L) 9.2(L) 10.5(L)  Hematocrit 36.0 - 46.0 % 27.5(L) 29.6(L) 32.4(L)  Platelets 150 - 400 K/uL 552(H) 640(H) 591(H)    CMP Latest Ref Rng & Units 10/07/2021 10/06/2021 10/05/2021  Glucose 70 - 99 mg/dL 89 81 95  BUN 8 - 23 mg/dL _1 Creatinine 0.44 - 1.00 mg/dL 0.75 0.84 0.69  Sodium 135 - 145 mmol/L 129(L) 131(L) 132(L)  Potassium 3.5 - 5.1 mmol/L 4.0 4.0  4.1  Chloride 98 - 111 mmol/L 93(L) 93(L) 95(L)  CO2 22 - 32 mmol/L 28 32 30  Calcium 8.9 - 10.3 mg/dL 7.9(L) 8.1(L) 8.4(L)  Total Protein 6.5 - 8.1 g/dL - - -  Total Bilirubin 0.3 - 1.2 mg/dL - - -  Alkaline Phos 38 - 126 U/L - - -  AST 15 - 41 U/L - - -  ALT 0 - 44 U/L - - -    Imaging: No results found.  Assessment/Plan:   Active Problems:   COPD (chronic obstructive pulmonary disease) (HCC)   Protein-calorie malnutrition (HCC)   Generalized anxiety disorder   Lower extremity ulceration (HCC)   AF (paroxysmal atrial fibrillation) (HCC)   Pressure injury of skin   HAP (hospital-acquired pneumonia)   Patient Summary: Kristen [name] is a 75 y.o. with a pertinent PMH of  COPD (3 L [place] at baseline), chronic A. fib, venous insufficiency, aspiration pneumonia, who presented with increasing dyspnea and cough admitted for acute on chronic hypoxic respiratory failure likely 2/2 pneumonia.   #Acute on Chronic Hypoxic Respiratory Failure, Multifactorial #CAP #RUL Heterogenous consolidation with mediastinal LAD #Moderate Rt Exudative Pleural Effusion: #COPD (Gold 4) Her prognosis remains poor considering multiple pulmonary diseases and worsening functional status in the setting of new lung mass  and effusion. Has established with palliative care and interested in hospice, but will continue full scope of care at least for now pending more family discussions. Will follow up after palliative care meeting. WBC has decreased some after starting antibiotics but has been stable for the last several days at about 16.5 -- Continue Zosyn to complete a 10 day course -- Palliative care following, appreciate assistance --Blood cultures no growth after 4 day -- Continue on daily inhalers and as needed albuterol --Aspiration precaution   #Normocytic Anemia: S/p 1 unit pRBC.  Hemoglobin improved to 10.5 but has now trended down to 8.6. --Continue to hold Xarelto, continue SCDs   #Persistent Atrial  Fibrillation (CHA2DS2-VASc 6, and HAS-BLED 3) Remains in normal sinus with HR in the 60s.  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 There is RA dilation, mild MVR, moderate TVR.  Not fluid overloaded on exam.  On 2 L [place] with O2 sats above 95%. --Continue to monitor closely   #CAD: #HTN: Patient had an episode of hypotension on 11/14 but pressures have improved to the 100-110s on 11/15. --Continue metoprolol  -- hold metoprolol for HR <60 or BP <100 --Continue midodrine 5 mg daily   #Hyponatremia Stable, chronic  #Protein-caloric malnutrition: Continue dietary supplementation #Depression: Continue mirtazipine 15 mg qHS , buspirone 10 mg     Scarlett Presto, MD Internal Medicine Resident PGY-1 Pager [phone] Please contact the on call pager after 5 pm and on weekends at [phone].

## 2021-10-08 NOTE — Progress Notes (Signed)
Nutrition Follow-up  DOCUMENTATION CODES:   Underweight, Severe malnutrition in context of chronic illness  INTERVENTION:  -Continue Ensure Enlive po TID, each supplement provides 350 kcal and 20 grams of protein -Continue MVI with minerals daily  NUTRITION DIAGNOSIS:   Severe Malnutrition related to chronic illness (COPD) as evidenced by severe fat depletion, severe muscle depletion. ongoing  GOAL:   Patient will meet greater than or equal to 90% of their needs progressing  MONITOR:   PO intake, Supplement acceptance, Weight trends, Labs, I & O's  REASON FOR ASSESSMENT:   Malnutrition Screening Tool    ASSESSMENT:   Pt with PMH significant for COPD on 3L O2 at baseline, chronic Afib, venous insufficiency, h/o transient hypotension, and multiple recent admissions for aspiration pneumonia admitted for HAP (pt from SNF).  Pt denies any complaints at this time. Pt was excited to see more of her family members yesterday. PO intake has been adequate with 0-100% x last 8 recorded meals (66% avg meal intake) and good supplement acceptance per RN (Ensure TID).   UOP: 2240m documented x24 hours I/O: +[id number] admit  Medications: mag-ox, protonix, mvi with minerals, IV abx Labs: Na 129 (L)  Diet Order:   Diet Order             Diet regular Room service appropriate? Yes; Fluid consistency: Thin  Diet effective now                   EDUCATION NEEDS:   No education needs have been identified at this time  Skin:  Skin Assessment: Skin Integrity Issues: Skin Integrity Issues:: DTI, Stage II, Other (Comment) DTI: sacrum Stage II: coccyx Other: venous stasis ulcers R/L pretibial area  Last BM:  11/13  Height:   Ht Readings from Last 1 Encounters:  10/01/21 _0  (1.803 m)    Weight:   Wt Readings from Last 10 Encounters:  10/02/21 45.1 kg  09/10/21 46.3 kg  07/03/21 41.9 kg  06/17/21 46.4 kg  05/05/21 41 kg  03/07/21 43.5 kg  03/04/21 43.8 kg   02/23/21 42.9 kg  02/06/21 46.4 kg  12/26/20 44.2 kg   BMI:  Body mass index is 13.87 kg/m.  Estimated Nutritional Needs:   Kcal:  1400-1600  Protein:  70-80 grams  Fluid:  >1.4L     AmTheone Stanley MS, RD, LDN (she/her/hers) RD pager number and weekend/on-call pager number located in [facility]

## 2021-10-09 DIAGNOSIS — Z66 Do not resuscitate: Secondary | ICD-10-CM

## 2021-10-09 LAB — CBC
HCT: 28.1 % — ABNORMAL LOW (ref 36.0–46.0)
Hemoglobin: 8.8 g/dL — ABNORMAL LOW (ref 12.0–15.0)
MCH: 27.3 pg (ref 26.0–34.0)
MCHC: 31.3 g/dL (ref 30.0–36.0)
MCV: 87.3 fL (ref 80.0–100.0)
Platelets: 558 10*3/uL — ABNORMAL HIGH (ref 150–400)
RBC: 3.22 MIL/uL — ABNORMAL LOW (ref 3.87–5.11)
RDW: 16.7 % — ABNORMAL HIGH (ref 11.5–15.5)
WBC: 14.4 10*3/uL — ABNORMAL HIGH (ref 4.0–10.5)
nRBC: 0 % (ref 0.0–0.2)

## 2021-10-09 LAB — RESP PANEL BY RT-PCR (FLU A&B, COVID) ARPGX2
Influenza A by PCR: NEGATIVE
Influenza B by PCR: NEGATIVE
SARS Coronavirus 2 by RT PCR: NEGATIVE

## 2021-10-09 MED ORDER — METOPROLOL SUCCINATE ER 25 MG PO TB24: 25.0000 mg | ORAL_TABLET | Freq: Every day | ORAL | 0 refills | Status: DC

## 2021-10-09 MED ORDER — AMOXICILLIN-POT CLAVULANATE 875-125 MG PO TABS: 1.0000 | ORAL_TABLET | Freq: Two times a day (BID) | ORAL | 0 refills | Status: AC

## 2021-10-09 MED ORDER — AMOXICILLIN-POT CLAVULANATE 875-125 MG PO TABS: 1.0000 | ORAL_TABLET | Freq: Two times a day (BID) | ORAL | 0 refills | Status: DC

## 2021-10-09 MED ORDER — RIVAROXABAN 15 MG PO TABS
15.0000 mg | ORAL_TABLET | Freq: Every day | ORAL | Status: DC
  Administered 2021-10-09: 15 mg via ORAL
  Filled 2021-10-09: qty 1

## 2021-10-09 NOTE — Progress Notes (Signed)
OT Cancellation Note  Patient Details Name: Kristen [name] MRN: [id number] DOB: Nov 30, 1945   Cancelled Treatment:    Reason Eval/Treat Not Completed: Patient at procedure or test/ unavailable;Fatigue/lethargy limiting ability to participate: Pt attempted at 1031 and involved with Nursing and staff. Asked for OT to come in 30-60 min.  Returned at 11:21 and pt refused due to desire to save energy to have a visit with a friend "who is going to fix my hair." Will continue efforts.  Julien Girt 10/09/2021, 12:20 PM

## 2021-10-09 NOTE — Progress Notes (Signed)
Patient ID: Kristen [name], female   DOB: 03/03/1946, 75 y.o.   MRN: [id number]    Progress Note from the Palliative Medicine Team at [facility]   Patient Name: [name]        Date: 10/09/2021 DOB: 28-Nov-1945  Age: 75 y.o. MRN#: [id number] Attending Physician: Angelica Pou, MD Primary Care Physician: [name], MD Admit Date: 10/01/2021   Medical records reviewed   Tykisha Areola is a 75 yo female with history of COPD, severe protein malnutrition with BMI currently 13 who has a right sided consolidated pneumonia complicated by large parpneumonic effusion that is freeflowing without loculations. Suspected aspiration pneumonia and less likely lung cancer as she had a CT scan in April without a mass. She is not a candidate for surgical intervention or chest tube placement. She is being medically managed.  Patient has had continued physical and functional decline over the past many months.  She has had 3 recent hospitalizations.  Met today at patient's bedside with her sister Maebelle Munroe as follow-up to previous goals of care meeting during this hospitalization.  Continued conversation regarding current medical situation; diagnosis, gnosis, goals of care, treatment option decisions, advanced care planning, and anticipatory care needs.  Education again offered regarding natural trajectory of severe COPD and adult failure to thrive.  We discussed the impact of mobility on overall wellness.  Education offered today on the difference between an aggressive medical intervention path and a palliative comfort path for this patient this time in this situation.  Education offered on hospice philosophy and hospice benefit.  Patient and her sister verbalized an understanding of the seriousness of the current medical situation, and patient's high risk to decompensate.   Patient remains hopeful for " more time"   Plan of Care:  -DNR/DNI                MOST form completed  today -Continue to treat the treatable, patient  is hopeful for improvement  -discharge to SNF for short term rehab -recommend OP Community based palliative services-  Discussed with patient the importance of continued conversation with her family and the medical providers regarding overall plan of care and treatment options,  ensuring decisions are within the context of the patients values and GOCs.  Education offered on the importance of documentation of advanced care planning so that patient care moving forward can be patient centered and goal directed  Questions and concerns addressed     Total time spent on the unit was 35 minutes  Greater than 50% of the time was spent in counseling and coordination of care  Wadie Lessen NP  Palliative Medicine Team Team Phone # [phone] Pager [phone]

## 2021-10-09 NOTE — TOC Transition Note (Addendum)
Transition of Care [facility]) - CM/SW Discharge Note   Patient Details  Name: Kristen [name] MRN: [id number] Date of Birth: 02/12/1946  [facility]) CM/SW Contact:  Sable Feil, LCSW Phone Number: 10/09/2021, 3:35 PM   Clinical Narrative:  Patient medically ready and returning to [facility] to continue [facility] rehab. Discharge clinicals transmitted to facility, and non-emergency transport arranged. Visited room and talked with patient and a family member regarding her discharge and ambulance transport. [name] informed CSW that she would contact her daughter and sister.  Submitted for and received insurance authorization from [facility] via [employer]. Auth ID - [id number], effective 11/17.      Final next level of care: [facility]) Barriers to Discharge: Barriers Resolved   Patient Goals and CMS Choice Patient states their goals for this hospitalization and ongoing recovery are:: Patient agreeable to returning to [facility] to continueST rehab CMS Medicare.gov Compare Post Acute Care list provided to:: Other (Comment Required) (not needed, as patient returning to [facility]) Choice offered to / list presented to : NA  Discharge Placement   Existing PASRR number confirmed : 10/09/21          Patient [facility] bed at: [facility] Patient to be transferred to facility by: Non-emergency ambulance transport Name of family member notified: [name] - sister - [phone] Patient and family notified of of transfer: 10/09/21  Discharge Plan and Services     Post Acute Care Choice: [place]                               Social Determinants of Health (SDOH) Interventions No SDOH interventions requested or needed at discharge.   O   Readmission Risk Interventions Readmission Risk Prevention Plan 06/17/2021 06/02/2021 09/29/2020  Transportation Screening Complete Complete Complete  PCP or Specialist Appt within 5-7 Days -  - Complete  Home Care Screening - - Complete  Medication Review (RN CM) - - Complete  Medication Review Press photographer) - Complete -  PCP or Specialist appointment within 3-5 days of discharge - Complete -  [place] or Home Care Consult - Complete -  SW Recovery Care/Counseling Consult Complete Complete -  Palliative Care Screening Complete Complete -  Skilled Nursing Facility Complete Complete -  Some recent data might be hidden

## 2021-10-09 NOTE — Progress Notes (Addendum)
HD#8 Subjective:  Overnight Events: NAEO   Feeling well this morning. Says that she felt a little congested today but is still breathing alright. She had breakfast today and it was pretty good. Looking forward to seeing her family again.  Objective:  Vital signs in last 24 hours: Vitals:   10/08/21 2032 10/09/21 0455 10/09/21 0841 10/09/21 1029  BP: 120/65 116/60  134/66  Pulse: (!) 58 (!) 51  66  Resp: _0 Temp: 98.4 F (36.9 C) 98 F (36.7 C)    TempSrc: Oral Oral    SpO2: 93% 96% 90% 93%  Weight:      Height:       Supplemental O2: Room Air SpO2: 93 % O2 Flow Rate (L/min): 2 L/min FiO2 (%): 28 %   Physical Exam:  Constitutional: Very cachectic elderly woman in NAD Pulmonary/Chest: Pulmonary cachexia. normal WOB on 2 L [place], able to speak in full sentences, coarse breath sounds Abdominal: Soft.  scaphoid MSK: Decreased muscle mass. Neurological: Alert and oriented x3.  No focal deficits. Skin: heel breakdown with bandages c/d/I, decreased skin turgor Psych: normal mood and affect  Filed Weights   10/01/21 1313 10/02/21 2300  Weight: 44.5 kg 45.1 kg     Intake/Output Summary (Last 24 hours) at 10/09/2021 1300 Last data filed at 10/09/2021 0308 Gross per 24 hour  Intake 357.5 ml  Output 1050 ml  Net -692.5 ml   Net IO Since Admission: 2,086.45 mL [10/09/21 1300]  Pertinent Labs: CBC Latest Ref Rng & Units 10/09/2021 10/07/2021 10/06/2021  WBC 4.0 - 10.5 K/uL 14.4(H) 16.4(H) 16.4(H)  Hemoglobin 12.0 - 15.0 g/dL 8.8(L) 8.6(L) 9.2(L)  Hematocrit 36.0 - 46.0 % 28.1(L) 27.5(L) 29.6(L)  Platelets 150 - 400 K/uL 558(H) 552(H) 640(H)    CMP Latest Ref Rng & Units 10/07/2021 10/06/2021 10/05/2021  Glucose 70 - 99 mg/dL 89 81 95  BUN 8 - 23 mg/dL _1 Creatinine 0.44 - 1.00 mg/dL 0.75 0.84 0.69  Sodium 135 - 145 mmol/L 129(L) 131(L) 132(L)  Potassium 3.5 - 5.1 mmol/L 4.0 4.0 4.1  Chloride 98 - 111 mmol/L 93(L) 93(L) 95(L)  CO2 22 - 32 mmol/L 28  32 30  Calcium 8.9 - 10.3 mg/dL 7.9(L) 8.1(L) 8.4(L)  Total Protein 6.5 - 8.1 g/dL - - -  Total Bilirubin 0.3 - 1.2 mg/dL - - -  Alkaline Phos 38 - 126 U/L - - -  AST 15 - 41 U/L - - -  ALT 0 - 44 U/L - - -    Imaging: No results found.  Assessment/Plan:   Active Problems:   COPD (chronic obstructive pulmonary disease) (HCC)   Protein-calorie malnutrition (HCC)   Generalized anxiety disorder   Lower extremity ulceration (HCC)   AF (paroxysmal atrial fibrillation) (HCC)   Pressure injury of skin   HAP (hospital-acquired pneumonia)   Patient Summary: Kristen [name] is a 75 y.o. with a pertinent PMH of  COPD (3 L [place] at baseline), chronic A. fib, venous insufficiency, aspiration pneumonia, who presented with increasing dyspnea and cough admitted for acute on chronic hypoxic respiratory failure likely 2/2 pneumonia.   #Acute on Chronic Hypoxic Respiratory Failure, Multifactorial #CAP #RUL Heterogenous consolidation with mediastinal LAD #Moderate Rt Exudative Pleural Effusion: #COPD (Gold 4) Her prognosis remains poor considering multiple pulmonary diseases and worsening functional status in the setting of new lung mass and effusion. Has established with palliative care and interested in hospice, but will continue full scope  of care at least for now pending more family discussions. Will follow up after palliative care meeting. WBC has decreased some after starting antibiotics. Down to 77 today. -- Continue abx to complete a 10 day course; switch to augmentin upon discharge -- Palliative care following, appreciate assistance --Blood cultures no growth after 4 day -- Continue on daily inhalers and as needed albuterol --Aspiration precaution   #Normocytic Anemia: stable S/p 1 unit pRBC.  Hemoglobin stable -- restart Xarelto   #Persistent Atrial Fibrillation (CHA2DS2-VASc 6, and HAS-BLED 3) Remains in normal sinus with HR in the 60s.  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 There is RA dilation, mild MVR, moderate TVR.  Not fluid overloaded on exam.  On 2 L [place] with O2 sats above 95%. --Continue to monitor closely   #CAD: #HTN: Patient had an episode of hypotension on 11/14 but pressures have improved to the 100-110s on 11/15. --Continue metoprolol  -- hold metoprolol for HR <60 or BP <100 --Continue midodrine 5 mg daily   #Hyponatremia Stable, chronic   #Protein-caloric malnutrition: Continue dietary supplementation #Depression: Continue mirtazipine 15 mg qHS , buspirone 10 mg   Scarlett Presto, MD Internal Medicine Resident PGY-1 Pager [phone] Please contact the on call pager after 5 pm and on weekends at [phone].

## 2021-10-09 NOTE — Progress Notes (Signed)
PT Cancellation Note  Patient Details Name: Kristen [name] MRN: [id number] DOB: 12-03-1945   Cancelled Treatment:    Reason Eval/Treat Not Completed: Patient declined, no reason specified. Pt reports her friend is coming to comb her hair out, refusing to mobilize with PT at this time. PT attempts to encourage pt to participate in mobility however pt continues to decline and change topic of conversation. PT will attempt to follow up as time allows.   Zenaida Niece 10/09/2021, 3:33 PM

## 2021-10-09 NOTE — TOC Progression Note (Signed)
Transition of Care [facility]) - Progression Note    Patient Details  Name: Kristen [name] MRN: [id number] Date of Birth: 09/25/1946  Transition of [facility]) CM/SW Contact  Sharlet Salina Mila Homer, LCSW Phone Number: 10/09/2021, 11:30 AM  Clinical Narrative:  Patient medically stable for discharge and submitted for insurance authorization. Candace Cruise ID #[id number].      Expected Discharge Plan: [place] Barriers to Discharge: Continued Medical Work up, Ship broker  Expected Discharge Plan and Services Expected Discharge Plan: [place] Choice: [place] arrangements for the past 2 months: Single Family Home                                       Social Determinants of Health (SDOH) Interventions  No SDOH interventions requested or needed at discharge.  Readmission Risk Interventions Readmission Risk Prevention Plan 06/17/2021 06/02/2021 09/29/2020  Transportation Screening Complete Complete Complete  PCP or Specialist Appt within 5-7 Days - - Complete  Home Care Screening - - Complete  Medication Review (RN CM) - - Complete  Medication Review [employer] photographer) - Complete -  PCP or Specialist appointment within 3-5 days of discharge - Complete -  [place] or Home Care Consult - Complete -  SW Recovery Care/Counseling Consult Complete Complete -  Palliative Care Screening Complete Complete -  Skilled Nursing Facility Complete Complete -  Some recent data might be hidden

## 2021-10-14 ENCOUNTER — Encounter: Payer: Medicare Other | Admitting: Student

## 2021-10-23 ENCOUNTER — Encounter (HOSPITAL_COMMUNITY): Payer: Self-pay

## 2021-10-23 ENCOUNTER — Inpatient Hospital Stay (HOSPITAL_COMMUNITY)
Admission: EM | Admit: 2021-10-23 | Discharge: 2021-10-28 | DRG: 193 | Disposition: A | Discharge status: Skilled Nursing Facility | Payer: Medicare Other | Source: Skilled Nursing Facility | Attending: Internal Medicine | Admitting: Internal Medicine

## 2021-10-23 ENCOUNTER — Other Ambulatory Visit: Payer: Self-pay | Admitting: Internal Medicine

## 2021-10-23 ENCOUNTER — Other Ambulatory Visit: Payer: Self-pay

## 2021-10-23 ENCOUNTER — Emergency Department (HOSPITAL_COMMUNITY): Payer: Medicare Other

## 2021-10-23 DIAGNOSIS — R0902 Hypoxemia: Secondary | ICD-10-CM

## 2021-10-23 DIAGNOSIS — Z9981 Dependence on supplemental oxygen: Secondary | ICD-10-CM | POA: Diagnosis not present

## 2021-10-23 DIAGNOSIS — Z66 Do not resuscitate: Secondary | ICD-10-CM | POA: Diagnosis present

## 2021-10-23 DIAGNOSIS — I9589 Other hypotension: Secondary | ICD-10-CM | POA: Diagnosis present

## 2021-10-23 DIAGNOSIS — Z87891 Personal history of nicotine dependence: Secondary | ICD-10-CM

## 2021-10-23 DIAGNOSIS — J90 Pleural effusion, not elsewhere classified: Secondary | ICD-10-CM | POA: Diagnosis present

## 2021-10-23 DIAGNOSIS — Z7901 Long term (current) use of anticoagulants: Secondary | ICD-10-CM | POA: Diagnosis not present

## 2021-10-23 DIAGNOSIS — I5042 Chronic combined systolic (congestive) and diastolic (congestive) heart failure: Secondary | ICD-10-CM | POA: Diagnosis present

## 2021-10-23 DIAGNOSIS — J449 Chronic obstructive pulmonary disease, unspecified: Secondary | ICD-10-CM | POA: Diagnosis present

## 2021-10-23 DIAGNOSIS — Y95 Nosocomial condition: Secondary | ICD-10-CM | POA: Diagnosis present

## 2021-10-23 DIAGNOSIS — Z833 Family history of diabetes mellitus: Secondary | ICD-10-CM

## 2021-10-23 DIAGNOSIS — J9621 Acute and chronic respiratory failure with hypoxia: Secondary | ICD-10-CM | POA: Diagnosis present

## 2021-10-23 DIAGNOSIS — D649 Anemia, unspecified: Secondary | ICD-10-CM | POA: Diagnosis present

## 2021-10-23 DIAGNOSIS — D491 Neoplasm of unspecified behavior of respiratory system: Secondary | ICD-10-CM | POA: Diagnosis present

## 2021-10-23 DIAGNOSIS — J918 Pleural effusion in other conditions classified elsewhere: Secondary | ICD-10-CM | POA: Diagnosis present

## 2021-10-23 DIAGNOSIS — F32A Depression, unspecified: Secondary | ICD-10-CM | POA: Diagnosis present

## 2021-10-23 DIAGNOSIS — J189 Pneumonia, unspecified organism: Secondary | ICD-10-CM | POA: Diagnosis present

## 2021-10-23 DIAGNOSIS — Z20822 Contact with and (suspected) exposure to covid-19: Secondary | ICD-10-CM | POA: Diagnosis present

## 2021-10-23 DIAGNOSIS — E861 Hypovolemia: Secondary | ICD-10-CM | POA: Diagnosis present

## 2021-10-23 DIAGNOSIS — R0602 Shortness of breath: Secondary | ICD-10-CM | POA: Diagnosis present

## 2021-10-23 DIAGNOSIS — F411 Generalized anxiety disorder: Secondary | ICD-10-CM | POA: Diagnosis present

## 2021-10-23 DIAGNOSIS — I4819 Other persistent atrial fibrillation: Secondary | ICD-10-CM | POA: Diagnosis present

## 2021-10-23 DIAGNOSIS — E43 Unspecified severe protein-calorie malnutrition: Secondary | ICD-10-CM | POA: Diagnosis present

## 2021-10-23 DIAGNOSIS — L89150 Pressure ulcer of sacral region, unstageable: Secondary | ICD-10-CM | POA: Diagnosis present

## 2021-10-23 DIAGNOSIS — Z993 Dependence on wheelchair: Secondary | ICD-10-CM | POA: Diagnosis not present

## 2021-10-23 DIAGNOSIS — I480 Paroxysmal atrial fibrillation: Secondary | ICD-10-CM | POA: Diagnosis not present

## 2021-10-23 DIAGNOSIS — Z9889 Other specified postprocedural states: Secondary | ICD-10-CM

## 2021-10-23 DIAGNOSIS — E871 Hypo-osmolality and hyponatremia: Secondary | ICD-10-CM | POA: Diagnosis present

## 2021-10-23 DIAGNOSIS — Z681 Body mass index (BMI) 19 or less, adult: Secondary | ICD-10-CM | POA: Diagnosis not present

## 2021-10-23 DIAGNOSIS — I110 Hypertensive heart disease with heart failure: Secondary | ICD-10-CM | POA: Diagnosis present

## 2021-10-23 DIAGNOSIS — J439 Emphysema, unspecified: Secondary | ICD-10-CM | POA: Diagnosis present

## 2021-10-23 LAB — PROCALCITONIN: Procalcitonin: 0.41 ng/mL

## 2021-10-23 LAB — COMPREHENSIVE METABOLIC PANEL
ALT: 8 U/L (ref 0–44)
AST: 13 U/L — ABNORMAL LOW (ref 15–41)
Albumin: 2.3 g/dL — ABNORMAL LOW (ref 3.5–5.0)
Alkaline Phosphatase: 140 U/L — ABNORMAL HIGH (ref 38–126)
Anion gap: 11 (ref 5–15)
BUN: 18 mg/dL (ref 8–23)
CO2: 27 mmol/L (ref 22–32)
Calcium: 8.6 mg/dL — ABNORMAL LOW (ref 8.9–10.3)
Chloride: 87 mmol/L — ABNORMAL LOW (ref 98–111)
Creatinine, Ser: 0.69 mg/dL (ref 0.44–1.00)
GFR, Estimated: >60 mL/min (ref >60)
Glucose, Bld: 88 mg/dL (ref 70–99)
Potassium: 4.1 mmol/L (ref 3.5–5.1)
Sodium: 125 mmol/L — ABNORMAL LOW (ref 135–145)
Total Bilirubin: 0.8 mg/dL (ref 0.3–1.2)
Total Protein: 6.9 g/dL (ref 6.5–8.1)

## 2021-10-23 LAB — CBC WITH DIFFERENTIAL/PLATELET
Abs Immature Granulocytes: 0.11 10*3/uL — ABNORMAL HIGH (ref 0.00–0.07)
Basophils Absolute: 0.1 10*3/uL (ref 0.0–0.1)
Basophils Relative: 0 %
Eosinophils Absolute: 0.2 10*3/uL (ref 0.0–0.5)
Eosinophils Relative: 1 %
HCT: 28.1 % — ABNORMAL LOW (ref 36.0–46.0)
Hemoglobin: 8.8 g/dL — ABNORMAL LOW (ref 12.0–15.0)
Immature Granulocytes: 1 %
Lymphocytes Relative: 15 %
Lymphs Abs: 2 10*3/uL (ref 0.7–4.0)
MCH: 27.1 pg (ref 26.0–34.0)
MCHC: 31.3 g/dL (ref 30.0–36.0)
MCV: 86.5 fL (ref 80.0–100.0)
Monocytes Absolute: 1 10*3/uL (ref 0.1–1.0)
Monocytes Relative: 7 %
Neutro Abs: 10.3 10*3/uL — ABNORMAL HIGH (ref 1.7–7.7)
Neutrophils Relative %: 76 %
Platelets: 399 10*3/uL (ref 150–400)
RBC: 3.25 MIL/uL — ABNORMAL LOW (ref 3.87–5.11)
RDW: 17.6 % — ABNORMAL HIGH (ref 11.5–15.5)
WBC: 13.7 10*3/uL — ABNORMAL HIGH (ref 4.0–10.5)
nRBC: 0 % (ref 0.0–0.2)

## 2021-10-23 LAB — BRAIN NATRIURETIC PEPTIDE: B Natriuretic Peptide: 196.1 pg/mL — ABNORMAL HIGH (ref 0.0–100.0)

## 2021-10-23 LAB — TROPONIN I (HIGH SENSITIVITY)
Troponin I (High Sensitivity): 4 ng/L (ref <18)
Troponin I (High Sensitivity): 5 ng/L (ref <18)

## 2021-10-23 LAB — RESP PANEL BY RT-PCR (FLU A&B, COVID) ARPGX2
Influenza A by PCR: NEGATIVE
Influenza B by PCR: NEGATIVE
SARS Coronavirus 2 by RT PCR: NEGATIVE

## 2021-10-23 MED ORDER — OXYCODONE-ACETAMINOPHEN 5-325 MG PO TABS
1.0000 | ORAL_TABLET | Freq: Once | ORAL | Status: AC
  Administered 2021-10-23: 1 via ORAL
  Filled 2021-10-23: qty 1

## 2021-10-23 MED ORDER — SODIUM CHLORIDE 0.9 % IV SOLN: 2.0000 g | INTRAVENOUS | Status: DC

## 2021-10-23 MED ORDER — IOHEXOL 350 MG/ML SOLN
80.0000 mL | Freq: Once | INTRAVENOUS | Status: AC | PRN
  Administered 2021-10-23: 60 mL via INTRAVENOUS

## 2021-10-23 MED ORDER — MOMETASONE FURO-FORMOTEROL FUM 200-5 MCG/ACT IN AERO
1.0000 | INHALATION_SPRAY | Freq: Two times a day (BID) | RESPIRATORY_TRACT | Status: DC
  Administered 2021-10-24 – 2021-10-28 (×8): 1 via RESPIRATORY_TRACT
  Filled 2021-10-23: qty 8.8

## 2021-10-23 MED ORDER — SODIUM CHLORIDE 0.9% FLUSH
3.0000 mL | Freq: Two times a day (BID) | INTRAVENOUS | Status: DC
  Administered 2021-10-23 – 2021-10-28 (×9): 3 mL via INTRAVENOUS

## 2021-10-23 MED ORDER — SODIUM CHLORIDE 0.9 % IV SOLN
100.0000 mg | Freq: Two times a day (BID) | INTRAVENOUS | Status: DC
  Administered 2021-10-23 – 2021-10-26 (×7): 100 mg via INTRAVENOUS
  Filled 2021-10-23 (×10): qty 100

## 2021-10-23 MED ORDER — SENNOSIDES-DOCUSATE SODIUM 8.6-50 MG PO TABS
1.0000 | ORAL_TABLET | Freq: Every evening | ORAL | Status: DC | PRN
  Administered 2021-10-24 – 2021-10-27 (×3): 1 via ORAL
  Filled 2021-10-23 (×3): qty 1

## 2021-10-23 MED ORDER — UMECLIDINIUM BROMIDE 62.5 MCG/ACT IN AEPB
1.0000 | INHALATION_SPRAY | Freq: Every day | RESPIRATORY_TRACT | Status: DC
  Administered 2021-10-24: 1 via RESPIRATORY_TRACT
  Filled 2021-10-23: qty 7

## 2021-10-23 MED ORDER — RIVAROXABAN 15 MG PO TABS
15.0000 mg | ORAL_TABLET | Freq: Every day | ORAL | Status: DC
  Filled 2021-10-23: qty 1

## 2021-10-23 MED ORDER — TRAZODONE HCL 50 MG PO TABS
25.0000 mg | ORAL_TABLET | Freq: Every evening | ORAL | Status: DC | PRN
  Administered 2021-10-25 – 2021-10-27 (×3): 25 mg via ORAL
  Filled 2021-10-23 (×3): qty 1

## 2021-10-23 MED ORDER — POLYETHYLENE GLYCOL 3350 17 G PO PACK
17.0000 g | PACK | Freq: Every day | ORAL | Status: DC | PRN
  Administered 2021-10-25: 17 g via ORAL

## 2021-10-23 MED ORDER — PROCHLORPERAZINE EDISYLATE 10 MG/2ML IJ SOLN
5.0000 mg | Freq: Once | INTRAMUSCULAR | Status: DC | PRN
  Filled 2021-10-23: qty 2

## 2021-10-23 MED ORDER — ACETAMINOPHEN 650 MG RE SUPP: 650.0000 mg | Freq: Four times a day (QID) | RECTAL | Status: DC | PRN

## 2021-10-23 MED ORDER — METOPROLOL SUCCINATE ER 25 MG PO TB24
12.5000 mg | ORAL_TABLET | Freq: Every day | ORAL | Status: DC
  Filled 2021-10-23: qty 0.5
  Filled 2021-10-23: qty 1

## 2021-10-23 MED ORDER — IPRATROPIUM-ALBUTEROL 0.5-2.5 (3) MG/3ML IN SOLN
3.0000 mL | Freq: Four times a day (QID) | RESPIRATORY_TRACT | Status: DC | PRN
  Filled 2021-10-23: qty 3

## 2021-10-23 MED ORDER — CLOPIDOGREL BISULFATE 75 MG PO TABS
75.0000 mg | ORAL_TABLET | Freq: Every day | ORAL | Status: DC
  Administered 2021-10-24 – 2021-10-28 (×5): 75 mg via ORAL
  Filled 2021-10-23 (×6): qty 1

## 2021-10-23 MED ORDER — MIDODRINE HCL 5 MG PO TABS
10.0000 mg | ORAL_TABLET | Freq: Three times a day (TID) | ORAL | Status: DC
  Filled 2021-10-23: qty 2

## 2021-10-23 MED ORDER — PRO-STAT AWC PO LIQD: 30.0000 mL | Freq: Two times a day (BID) | ORAL | Status: DC

## 2021-10-23 MED ORDER — ACETAMINOPHEN 325 MG PO TABS
650.0000 mg | ORAL_TABLET | Freq: Four times a day (QID) | ORAL | Status: DC | PRN
  Administered 2021-10-24 – 2021-10-26 (×3): 650 mg via ORAL
  Filled 2021-10-23 (×3): qty 2

## 2021-10-23 MED ORDER — PRO-STAT SUGAR FREE PO LIQD
30.0000 mL | Freq: Two times a day (BID) | ORAL | Status: DC
  Filled 2021-10-23: qty 30

## 2021-10-23 MED ORDER — SODIUM CHLORIDE 0.9 % IV SOLN
1.0000 g | Freq: Once | INTRAVENOUS | Status: AC
  Administered 2021-10-23: 1 g via INTRAVENOUS
  Filled 2021-10-23: qty 10

## 2021-10-23 MED ORDER — BUSPIRONE HCL 5 MG PO TABS
10.0000 mg | ORAL_TABLET | Freq: Every day | ORAL | Status: DC
  Administered 2021-10-24 – 2021-10-28 (×5): 10 mg via ORAL
  Filled 2021-10-23: qty 2
  Filled 2021-10-23: qty 1
  Filled 2021-10-23: qty 2
  Filled 2021-10-23 (×2): qty 1

## 2021-10-23 MED ORDER — AMIODARONE HCL 200 MG PO TABS
200.0000 mg | ORAL_TABLET | Freq: Every day | ORAL | Status: DC
  Administered 2021-10-25 – 2021-10-28 (×4): 200 mg via ORAL
  Filled 2021-10-23 (×6): qty 1

## 2021-10-23 MED ORDER — SODIUM CHLORIDE 0.9 % IV SOLN
500.0000 mg | Freq: Once | INTRAVENOUS | Status: DC
  Filled 2021-10-23: qty 500

## 2021-10-23 MED ORDER — MIDODRINE HCL 5 MG PO TABS
10.0000 mg | ORAL_TABLET | Freq: Three times a day (TID) | ORAL | Status: DC
  Administered 2021-10-24 – 2021-10-28 (×15): 10 mg via ORAL
  Filled 2021-10-23 (×17): qty 2

## 2021-10-23 MED ORDER — PANTOPRAZOLE SODIUM 40 MG PO TBEC
40.0000 mg | DELAYED_RELEASE_TABLET | ORAL | Status: DC
  Administered 2021-10-24 – 2021-10-28 (×3): 40 mg via ORAL
  Filled 2021-10-23 (×4): qty 1

## 2021-10-23 MED ORDER — SODIUM CHLORIDE 0.9 % IV BOLUS
250.0000 mL | Freq: Once | INTRAVENOUS | Status: AC
  Administered 2021-10-23: 250 mL via INTRAVENOUS

## 2021-10-23 MED ORDER — MIRTAZAPINE 15 MG PO TABS
15.0000 mg | ORAL_TABLET | Freq: Every day | ORAL | Status: DC
  Administered 2021-10-24 – 2021-10-27 (×4): 15 mg via ORAL
  Filled 2021-10-23 (×4): qty 1

## 2021-10-23 NOTE — ED Triage Notes (Signed)
Pt arrives by [facility]: Per EMS: Pt coming from [facility] w/ no complaints per pt, but staff thinks pt may have pneumonia. Denies SHOB, denies pain, denies cough, denies fevers Hx pneumonia ; hx hypotension  100% 3L - baseline  123/64 BP  118 CBG  14 RR  55HR

## 2021-10-23 NOTE — ED Notes (Signed)
Pt BP 79/48. Dr. Myna Hidalgo aware. Pt sleeping.

## 2021-10-23 NOTE — ED Provider Notes (Signed)
[facility] DEPT Provider Note   CSN: [id number] Arrival date & time: 10/23/21  1719     History Chief Complaint  Patient presents with   Hypotension    Kristen [name] is a 75 y.o. female with PMH paroxysmal A. fib, COPD on 3 L home O2, recent hospital admission on 10/01/2021 and discharged 10/09/2021 for pneumonia with pleural effusion who presents the emergency department for evaluation of fever and concern from nursing home.  The patient currently has no complaints in the emergency department but states that she did have a fever today and nursing home staff are concerned that the patient's pneumonia has recurred.  Patient arrives on a nonrebreather briefly saturating 75% but after taking 2 deep breaths she immediately returned to 100% and transition back to her home O2's nasal cannula.  She currently denies chest pain, abdominal pain, nausea, vomiting, shortness of breath, cough or other systemic symptoms.  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 [facility])     Patient Active Problem List   Diagnosis Date Noted   Acute on chronic respiratory failure with hypoxia ([place]) 10/23/2021   Pleural effusion on right 10/23/2021   HAP (hospital-acquired pneumonia) 10/01/2021   Elevated troponin    Sepsis ([place]) 09/03/2021   Protein-calorie malnutrition, severe 09/03/2021   Community acquired pneumonia 09/01/2021   Diarrhea 09/01/2021   Chronic respiratory failure with hypoxia ([place]) 09/01/2021   Hypotension    Pressure injury of skin 06/08/2021   CAP (community acquired pneumonia) 05/29/2021   Aspiration pneumonia ([place]) 05/29/2021   Atrial fibrillation with RVR ([place])  05/27/2021   Cellulitis of left lower extremity 05/06/2021   Aortic atherosclerosis ([place]) 03/07/2021   Opiate use 03/05/2021   Need for assessment by dentistry for poor dentition 03/05/2021   Abdominal pain 02/06/2021   Health care maintenance 12/27/2020   AF (paroxysmal atrial fibrillation) ([place]) 12/16/2020   Nausea 11/07/2020   Lower extremity ulceration ([place]) 10/04/2020   Generalized anxiety disorder 08/27/2020   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     Family History  Problem Relation Age of Onset   Diabetes Mother    Diabetes Mellitus II Mother    Diabetes Father    Diverticulitis Sister     Social History   Tobacco Use   Smoking status: Former    Packs/day: 0.25    Types: Cigarettes    Quit date: 10/16/2019    Years since quitting: 2.0   Smokeless tobacco: Never   Tobacco comments:    2-3 per day   Vaping Use   Vaping Use: Never used  Substance Use Topics   Alcohol use: Yes  Comment: occasionally   Drug use: Never    Home Medications Prior to Admission medications   Medication Sig Start Date End Date Taking? Authorizing Provider  acetaminophen (TYLENOL) 325 MG tablet Take 325 mg by mouth every 6 (six) hours as needed for moderate pain.   Yes [provider]  albuterol (VENTOLIN HFA) 108 (90 Base) MCG/ACT inhaler INHALE 1-2 PUFFS BY MOUTH EVERY 6 HOURS AS NEEDED FOR WHEEZE OR SHORTNESS OF BREATH Patient taking differently: Inhale 2 puffs into the lungs See admin  instructions. Inhale 2 puffs into the lungs two times a day and every 6 hours as needed for COPD 09/30/21  Yes [name], MD  Amino Acids-Protein Hydrolys (PRO-STAT AWC) LIQD Take 30 mLs by mouth 2 (two) times daily.   Yes [provider]  amiodarone (PACERONE) 200 MG tablet TAKE 1 TABLET BY MOUTH EVERY DAY Patient taking differently: Take 200 mg by mouth daily. 08/05/21  Yes [name], MD  bisacodyl (DULCOLAX) 10 MG suppository Place 10 mg rectally daily as needed (for constipation).   Yes [provider]  busPIRone (BUSPAR) 10 MG tablet Take 1 tablet (10 mg total) by mouth daily. 07/03/21  Yes Katsadouros, Vasilios, MD  clopidogrel (PLAVIX) 75 MG tablet Take 1 tablet (75 mg total) by mouth daily. 09/18/21  Yes Mariel Aloe, MD  docusate sodium (COLACE) 100 MG capsule Take 1 capsule (100 mg total) by mouth 2 (two) times daily as needed for mild constipation. Patient taking differently: Take 100 mg by mouth 2 (two) times daily. 06/03/21  Yes Maudie Mercury, MD  ferrous sulfate 324 MG TBEC Take 324 mg by mouth 2 (two) times daily with a meal.   Yes [provider]  fluticasone (FLONASE) 50 MCG/ACT nasal spray Place 1 spray into both nostrils daily as needed for allergies or rhinitis. 03/31/21  Yes Katsadouros, Vasilios, MD  ipratropium-albuterol (DUONEB) 0.5-2.5 (3) MG/3ML SOLN TAKE 3 MLS BY NEBULIZATION EVERY 6 (SIX) HOURS AS NEEDED (SHORTNESS OF BREATH/WHEEZING.). Patient taking differently: Take 3 mLs by nebulization every 6 (six) hours as needed (shortness of breath or wheezing). 07/31/21 10/29/21 Yes [name], MD  ketoconazole (NIZORAL) 2 % cream Apply 1 application topically See admin instructions. Apply to rash on left hand once a day (1st digit)   Yes [provider]  magnesium oxide (MAG-OX) 400 MG tablet Take 400 mg by mouth daily.   Yes [provider]  metoprolol succinate (TOPROL-XL) 25 MG 24 hr tablet TAKE 1 TABLET (25 MG  TOTAL) BY MOUTH DAILY. Patient taking differently: Take 12.5 mg by mouth in the morning. 10/23/21 11/22/21 Yes [name], MD  midodrine (PROAMATINE) 5 MG tablet Take 1 tablet (5 mg total) by mouth 3 (three) times daily with meals. 09/17/21  Yes Mariel Aloe, MD  mirtazapine (REMERON) 15 MG tablet Take 1 tablet (15 mg total) by mouth at bedtime as needed (sleep). Patient taking differently: Take 15 mg by mouth at bedtime. 03/18/21  Yes Mosetta Anis, MD  mometasone-formoterol [facility]) 200-5 MCG/ACT AERO Inhale 1 puff into the lungs in the morning and at bedtime. 07/17/21  Yes Marianna Payment, MD  Multiple Vitamin-Folic Acid TABS Take 1 tablet by mouth daily with breakfast.   Yes [provider]  pantoprazole (PROTONIX) 40 MG tablet Take 1 tablet (40 mg total) by mouth daily at 6 (six) AM. Patient taking differently: Take 40 mg by mouth every other day. 09/18/21  Yes Mariel Aloe, MD  polyethylene glycol [facility] /  GLYCOLAX) 17 g packet Take 17 g by mouth daily as needed for moderate constipation. Patient taking differently: Take 17 g by mouth See admin instructions. Mix 17 grams of powder into 6 ounces of fluid and drink every morning AND once a day as needed for constipation 06/05/21  Yes Maudie Mercury, MD  traZODone (DESYREL) 50 MG tablet Take 25 mg by mouth at bedtime as needed for sleep.   Yes [provider]  umeclidinium bromide (INCRUSE ELLIPTA) 62.5 MCG/INH AEPB INHALE 1 PUFF BY MOUTH EVERY DAY Patient taking differently: Inhale 1 puff into the lungs daily. 07/17/21  Yes Marianna Payment, MD  vitamin C (ASCORBIC ACID) 500 MG tablet Take 500 mg by mouth daily.   Yes [provider]  XARELTO 15 MG TABS tablet TAKE 1 TABLET (15 MG TOTAL) BY MOUTH DAILY. 09/19/21  Yes [name], MD  feeding supplement (ENSURE ENLIVE / ENSURE PLUS) LIQD Take 237 mLs by mouth 2 (two) times daily between meals. Patient not taking: Reported on 10/23/2021 09/17/21   Mariel Aloe, MD  ondansetron (ZOFRAN) 4 MG tablet TAKE 1 TABLET BY MOUTH DAILY AS NEEDED FOR NAUSEA OR VOMITING. Patient not taking: Reported on 10/23/2021 08/05/21   [name], MD  oxyCODONE (OXY IR/ROXICODONE) 5 MG immediate release tablet Take 5 mg by mouth every 4 (four) hours as needed for moderate pain. Patient not taking: Reported on 10/23/2021    [provider]  oxyCODONE-acetaminophen (PERCOCET) 10-325 MG tablet Take 1 tablet by mouth every 8 (eight) hours as needed for pain. Patient not taking: Reported on 10/23/2021 09/17/21   Mariel Aloe, MD    Allergies    Gabapentin  Review of Systems   Review of Systems  Constitutional:  Positive for fever. Negative for chills.  HENT:  Negative for ear pain and sore throat.   Eyes:  Negative for pain and visual disturbance.  Respiratory:  Negative for cough and shortness of breath.   Cardiovascular:  Negative for chest pain and palpitations.  Gastrointestinal:  Negative for abdominal pain and vomiting.  Genitourinary:  Negative for dysuria and hematuria.  Musculoskeletal:  Negative for arthralgias and back pain.  Skin:  Negative for color change and rash.  Neurological:  Negative for seizures and syncope.  All other systems reviewed and are negative.  Physical Exam Updated Vital Signs BP (!) 89/51   Pulse (!) 57   Temp 97.9 F (36.6 C) (Oral)   Resp 18   SpO2 98%   Physical Exam Vitals and nursing note reviewed.  Constitutional:      General: She is not in acute distress.    Appearance: She is well-developed.  HENT:     Head: Normocephalic and atraumatic.  Eyes:     Conjunctiva/sclera: Conjunctivae normal.  Cardiovascular:     Rate and Rhythm: Normal rate and regular rhythm.     Heart sounds: No murmur heard. Pulmonary:     Effort: Pulmonary effort is normal. No respiratory distress.     Breath sounds: Normal breath sounds.  Abdominal:     Palpations: Abdomen is soft.     Tenderness: There is no  abdominal tenderness.  Musculoskeletal:        General: No swelling.     Cervical back: Neck supple.  Skin:    General: Skin is warm and dry.     Capillary Refill: Capillary refill takes less than 2 seconds.  Neurological:     Mental Status: She is alert.  Psychiatric:  Mood and Affect: Mood normal.    ED Results / Procedures / Treatments   Labs (all labs ordered are listed, but only abnormal results are displayed) Labs Reviewed  COMPREHENSIVE METABOLIC PANEL - Abnormal; Notable for the following components:      Result Value   Sodium 125 (*)    Chloride 87 (*)    Calcium 8.6 (*)    Albumin 2.3 (*)    AST 13 (*)    Alkaline Phosphatase 140 (*)    All other components within normal limits  CBC WITH DIFFERENTIAL/PLATELET - Abnormal; Notable for the following components:   WBC 13.7 (*)    RBC 3.25 (*)    Hemoglobin 8.8 (*)    HCT 28.1 (*)    RDW 17.6 (*)    Neutro Abs 10.3 (*)    Abs Immature Granulocytes 0.11 (*)    All other components within normal limits  BRAIN NATRIURETIC PEPTIDE - Abnormal; Notable for the following components:   B Natriuretic Peptide 196.1 (*)    All other components within normal limits  RESP PANEL BY RT-PCR (FLU A&B, COVID) ARPGX2  EXPECTORATED SPUTUM ASSESSMENT W GRAM STAIN, RFLX TO RESP C  PROCALCITONIN  PROCALCITONIN  BASIC METABOLIC PANEL  HEPATIC FUNCTION PANEL  LACTATE DEHYDROGENASE  CBC  LEGIONELLA PNEUMOPHILA SEROGP 1 UR AG  STREP PNEUMONIAE URINARY ANTIGEN  SODIUM, URINE, RANDOM  TROPONIN I (HIGH SENSITIVITY)  TROPONIN I (HIGH SENSITIVITY)    EKG None  Radiology CT Angio Chest PE W and/or Wo Contrast  Result Date: 10/23/2021 CLINICAL DATA:  Dyspnea and hypoxia. Current chest radiograph shows significant right lung airspace disease. EXAM: CT ANGIOGRAPHY CHEST WITH CONTRAST TECHNIQUE: Multidetector CT imaging of the chest was performed using the standard protocol during bolus administration of intravenous contrast.  Multiplanar CT image reconstructions and MIPs were obtained to evaluate the vascular anatomy. CONTRAST:  75m OMNIPAQUE IOHEXOL 350 MG/ML SOLN COMPARISON:  10/01/2021, CT chest.  Current chest radiograph. FINDINGS: Cardiovascular: Pulmonary arteries are well opacified. There is no evidence of a pulmonary embolism. Heart is normal in size. No pericardial effusion. Mild left coronary artery calcifications. Great vessels are normal in caliber. No aortic dissection. Mild aortic atherosclerosis. Is Mediastinum/Nodes: No neck base, mediastinal or hilar masses for enlarged lymph nodes. Trachea is unremarkable. Esophagus not well-defined, but grossly unremarkable. Lungs/Pleura: Occluded distal right main bronchus likely from secretions/mucus plugging. Most of the right lung is collapsed. Small portions of the right middle and right upper lobe are aerated. Large right pleural effusion. Left lung shows advanced emphysema. Mild dependent opacity in the left lower lobe and base of the left upper lobe lingula consistent with atelectasis. Trace left pleural effusion. Upper Abdomen: No acute abnormality. Musculoskeletal: No fracture or acute finding.  No bone lesion. Review of the MIP images confirms the above findings. IMPRESSION: 1. No evidence of a pulmonary embolism. 2. Large right pleural effusion with near complete atelectasis/collapse the right lung. 3. Distal right main bronchus is occluded. This is most likely due to mucous plugging. Endobronchial neoplasm is not excluded, however. Follow-up bronchoscopy is recommended. 4. Trace left pleural effusion with mild dependent left lower lobe and left upper lobe lingula atelectasis. 5. No evidence of pulmonary edema. 6. Advanced emphysema. Aortic Atherosclerosis (ICD10-I70.0) and Emphysema (ICD10-J43.9). Electronically Signed   By: DLajean ManesM.D.   On: 10/23/2021 20:18   DG Chest Port 1 View  Result Date: 10/23/2021 CLINICAL DATA:  Questionable pneumonia. EXAM: PORTABLE  CHEST 1 VIEW COMPARISON:  Chest  x-ray 10/03/2021.  CT of the chest 10/01/2021. FINDINGS: There is complete opacification of the right hemithorax which appears similar to the prior examination. Right pleural effusion has likely mildly increased in there is increased opacification in the aerated right lower lung. Left lung is clear. There is no pneumothorax. Cardiomediastinal silhouette is within normal limits and stable. No acute fractures. IMPRESSION: 1. Increasing large right pleural effusion. 2. Increasing right lower lung airspace disease. Electronically Signed   By: Ronney Asters M.D.   On: 10/23/2021 19:38    Procedures .Critical Care Performed by: Teressa Lower, MD Authorized by: Teressa Lower, MD   Critical care provider statement:    Critical care time (minutes):  30   Critical care was necessary to treat or prevent imminent or life-threatening deterioration of the following conditions:  Respiratory failure   Critical care was time spent personally by me on the following activities:  Development of treatment plan with patient or surrogate, discussions with consultants, evaluation of patient's response to treatment, examination of patient, ordering and review of laboratory studies, ordering and review of radiographic studies, ordering and performing treatments and interventions, pulse oximetry, re-evaluation of patient's condition and review of old charts   Medications Ordered in ED Medications  prochlorperazine (COMPAZINE) injection 5 mg (has no administration in time range)  sodium chloride flush (NS) 0.9 % injection 3 mL (3 mLs Intravenous Given 10/23/21 2125)  acetaminophen (TYLENOL) tablet 650 mg (has no administration in time range)    Or  acetaminophen (TYLENOL) suppository 650 mg (has no administration in time range)  senna-docusate (Senokot-S) tablet 1 tablet (has no administration in time range)  cefTRIAXone (ROCEPHIN) 2 g in sodium chloride 0.9 % 100 mL IVPB (has no  administration in time range)  doxycycline (VIBRAMYCIN) 100 mg in sodium chloride 0.9 % 250 mL IVPB (0 mg Intravenous Stopped 10/23/21 2253)  amiodarone (PACERONE) tablet 200 mg (has no administration in time range)  metoprolol succinate (TOPROL-XL) 24 hr tablet 12.5 mg (has no administration in time range)  busPIRone (BUSPAR) tablet 10 mg (has no administration in time range)  mirtazapine (REMERON) tablet 15 mg (has no administration in time range)  traZODone (DESYREL) tablet 25 mg (has no administration in time range)  pantoprazole (PROTONIX) EC tablet 40 mg (has no administration in time range)  polyethylene glycol (MIRALAX / GLYCOLAX) packet 17 g (has no administration in time range)  clopidogrel (PLAVIX) tablet 75 mg (has no administration in time range)  Rivaroxaban (XARELTO) tablet 15 mg (has no administration in time range)  ipratropium-albuterol (DUONEB) 0.5-2.5 (3) MG/3ML nebulizer solution 3 mL (has no administration in time range)  mometasone-formoterol (DULERA) 200-5 MCG/ACT inhaler 1 puff (has no administration in time range)  umeclidinium bromide (INCRUSE ELLIPTA) 62.5 MCG/ACT 1 puff (has no administration in time range)  feeding supplement (PRO-STAT SUGAR FREE 64) liquid 30 mL (has no administration in time range)  midodrine (PROAMATINE) tablet 10 mg (has no administration in time range)  oxyCODONE-acetaminophen (PERCOCET/ROXICET) 5-325 MG per tablet 1 tablet (1 tablet Oral Given 10/23/21 1940)  iohexol (OMNIPAQUE) 350 MG/ML injection 80 mL (60 mLs Intravenous Contrast Given 10/23/21 1955)  cefTRIAXone (ROCEPHIN) 1 g in sodium chloride 0.9 % 100 mL IVPB (0 g Intravenous Stopped 10/23/21 2124)  sodium chloride 0.9 % bolus 250 mL (250 mLs Intravenous New Bag/Given 10/23/21 2251)    ED Course  I have reviewed the triage vital signs and the nursing notes.  Pertinent labs & imaging results that were available during my care  of the patient were reviewed by me and considered in my  medical decision making (see chart for details).    MDM Rules/Calculators/A&P                           Patient seen the emergency department for evaluation of nursing home concern for pneumonia.  Physical exam reveals decreased breath sounds on the right.  Laboratory evaluation with leukocytosis to 13.7, hemoglobin 8.8, hyponatremia 125, hypochloremia 87, BNP elevated 196.  troponin unremarkable.  Chest x-ray with white out on the right.  CT PE with no evidence of pulmonary embolism but does show a large right pleural effusion with near total collapse of the right lung as well as a distal right main bronchus occlusion likely secondary to mucous plugging versus endobronchial neoplasm.  Patient requiring 6 L oxygen to maintain her O2 sats.  Ceftriaxone azithromycin ordered in the setting of likely underlying pneumonia and patient admitted for hypoxia and need for thoracentesis. Final Clinical Impression(s) / ED Diagnoses Final diagnoses:  Pleural effusion  Hypoxia    Rx / DC Orders ED Discharge Orders     None        Dao Mearns, Debe Coder, MD 10/23/21 [phone]

## 2021-10-23 NOTE — ED Notes (Signed)
Per Dr. Matilde Sprang- O2 sat is acceptable at 88% and above.

## 2021-10-23 NOTE — H&P (Signed)
History and Physical    Kristen [name] OZH:[id number] DOB: [birth year] DOA: 10/23/2021  PCP: [name], MD   Patient coming from: SNF   Chief Complaint: Fever, hypoxia   HPI: Kristen [name] is a very pleasant 75 y.o. female with medical history significant for COPD, chronic respiratory failure, HFpEF, PAF on Xarelto, chronic hyponatremia, chronic anemia, and recent admission for acute on chronic respiratory failure secondary to pneumonia, now presenting from her SNF for evaluation of fever and hypoxia. Patient denies increased SOB, increased cough, or chills but was noted to be febrile and hypoxic at her SNF and EMS was called for transport to the ED. She denies chest pain, leg swelling, melena, or hematochezia.   ED Course: Upon arrival to the ED, patient is found to be afebrile, requiring 8 L/min of supplemental oxygen to maintain saturations in the low 90s, and with blood pressure 99/61.  EKG features sinus rhythm with first-degree AV nodal block.  Chest x-ray with increasing and large right pleural effusion with increased right lower lobe airspace disease.  Chemistry panel features a sodium of 125.  CBC with leukocytosis to 13,700 and hemoglobin 8.8.  CTA chest is negative for PE but re-demonstrates advanced emphysema and the large right pleural effusion and reveals distal right main bronchus occlusion, likely from mucous plugging but with endobronchial neoplasm not excluded.  Patient was given Rocephin and azithromycin in the ED.  Review of Systems:  All other systems reviewed and apart from HPI, are negative.  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 Her smoking use included cigarettes. She smoked an average of .25 packs per day. She has never used smokeless tobacco. She reports current alcohol use. She reports that she does not use drugs.  Allergies  Allergen Reactions   Gabapentin Other (See Comments)    Hallucination/ nightmares- "Allergic," per [facility]    Family History  Problem Relation Age of Onset   Diabetes Mother    Diabetes Mellitus II Mother    Diabetes Father    Diverticulitis Sister      Prior to Admission medications   Medication Sig Start Date End Date Taking? Authorizing Provider  acetaminophen (TYLENOL) 325 MG tablet Take 325 mg by mouth every 6 (six) hours as needed for moderate pain.    [provider]  albuterol (VENTOLIN HFA) 108 (90 Base) MCG/ACT inhaler INHALE 1-2 PUFFS BY MOUTH EVERY 6 HOURS AS NEEDED FOR WHEEZE OR SHORTNESS OF BREATH Patient taking differently: Inhale 2 puffs into the lungs every 6 (six) hours as needed for shortness of breath. 09/30/21   [name], MD  amiodarone (PACERONE) 200 MG tablet TAKE 1 TABLET BY MOUTH EVERY DAY Patient taking differently: Take 200 mg by mouth daily. 08/05/21   [name], MD  busPIRone (BUSPAR) 10 MG tablet Take 1 tablet (10 mg total) by mouth daily. 07/03/21   Riesa Pope, MD  clopidogrel (PLAVIX) 75 MG tablet Take  1 tablet (75 mg total) by mouth daily. 09/18/21   Mariel Aloe, MD  docusate sodium (COLACE) 100 MG capsule Take 1 capsule (100 mg total) by mouth 2 (two) times daily as needed for mild constipation. 06/03/21   Maudie Mercury, MD  feeding supplement (ENSURE ENLIVE / ENSURE PLUS) LIQD Take 237 mLs by mouth 2 (two) times daily between meals. Patient not taking: Reported on 10/01/2021 09/17/21   Mariel Aloe, MD  ferrous sulfate 324 MG TBEC Take 324 mg by  mouth daily with breakfast.    [provider]  fluticasone (FLONASE) 50 MCG/ACT nasal spray Place 1 spray into both nostrils daily as needed for allergies or rhinitis. 03/31/21   Katsadouros, Vasilios, MD  ipratropium-albuterol (DUONEB) 0.5-2.5 (3) MG/3ML SOLN TAKE 3 MLS BY NEBULIZATION EVERY 6 (SIX) HOURS AS NEEDED (SHORTNESS OF BREATH/WHEEZING.). 07/31/21 10/29/21  [name], MD  ketoconazole (NIZORAL) 2 % cream Apply 1 application topically daily.    [provider]  magnesium oxide (MAG-OX) 400 MG tablet Take 400 mg by mouth daily.    [provider]  metoprolol succinate (TOPROL-XL) 25 MG 24 hr tablet TAKE 1 TABLET (25 MG TOTAL) BY MOUTH DAILY. 10/23/21 11/22/21  [name], MD  midodrine (PROAMATINE) 5 MG tablet Take 1 tablet (5 mg total) by mouth 3 (three) times daily with meals. 09/17/21   Mariel Aloe, MD  mirtazapine (REMERON) 15 MG tablet Take 1 tablet (15 mg total) by mouth at bedtime as needed (sleep). Patient taking differently: Take 15 mg by mouth at bedtime. 03/18/21   Mosetta Anis, MD  mometasone-formoterol (DULERA) 200-5 MCG/ACT AERO Inhale 1 puff into the lungs in the morning and at bedtime. 07/17/21   Marianna Payment, MD  Multiple Vitamin (MULTIVITAMIN WITH MINERALS) TABS tablet Take 1 tablet by mouth daily.    [provider]  ondansetron (ZOFRAN) 4 MG tablet TAKE 1 TABLET BY MOUTH DAILY AS NEEDED FOR NAUSEA OR VOMITING. Patient taking differently: Take 4 mg by mouth daily as needed for nausea or vomiting. 08/05/21   [name], MD  oxyCODONE (OXY IR/ROXICODONE) 5 MG immediate release tablet Take 5 mg by mouth every 4 (four) hours as needed for moderate pain.    [provider]  oxyCODONE-acetaminophen (PERCOCET) 10-325 MG tablet Take 1 tablet by mouth every 8 (eight) hours as needed for pain. 09/17/21   Mariel Aloe, MD  pantoprazole (PROTONIX) 40 MG tablet Take 1 tablet (40 mg total) by mouth daily at 6 (six) AM.  09/18/21   Mariel Aloe, MD  polyethylene glycol (MIRALAX / GLYCOLAX) 17 g packet Take 17 g by mouth daily as needed for moderate constipation. 06/05/21   Maudie Mercury, MD  PRESCRIPTION MEDICATION Take 30 mLs by mouth 2 (two) times daily. Medication: Pro-Stat AWC (amino acids-protein hydrolys) 17-100 gram-kal/30 ml liquid    [provider]  umeclidinium bromide (INCRUSE ELLIPTA) 62.5 MCG/INH AEPB INHALE 1 PUFF BY MOUTH EVERY DAY Patient taking differently: Inhale 1 puff into the lungs daily. 07/17/21   Marianna Payment, MD  XARELTO 15 MG TABS tablet TAKE 1 TABLET (15 MG TOTAL) BY MOUTH DAILY. Patient taking differently: Take 15 mg by mouth daily. 09/19/21   [name], MD    Physical Exam: Vitals:   10/23/21 1900 10/23/21 1930 10/23/21 1941 10/23/21 2005  BP: (!) 103/52 (!) 113/49  99/61  Pulse: (!) 58 60  62  Resp: _0 Temp:   97.9 F (36.6 C)  TempSrc:   Oral   SpO2: 99% 100%  97%    Constitutional: NAD, calm, cachectic  Eyes: PERTLA, lids and conjunctivae normal ENMT: Mucous membranes are moist. Posterior pharynx clear of any exudate or lesions.   Neck: supple, no masses  Respiratory: severely diminished, particularly on right. No wheezing, no crackles. No accessory muscle use.  Cardiovascular: S1 & S2 heard, regular rate and rhythm. No extremity edema.  Abdomen: No distension, no tenderness, soft. Bowel sounds active.  Musculoskeletal: no clubbing / cyanosis. No joint deformity upper and lower extremities.   Skin: no significant rashes, lesions, ulcers. Warm, dry, well-perfused. Poor turgor.  Neurologic: CN 2-12 grossly intact. Moving all extremities. Alert and oriented.  Psychiatric: Very pleasant. Cooperative.    Labs and Imaging on Admission: I have personally reviewed following labs and imaging studies  CBC: Recent Labs  Lab 10/23/21 1754  WBC 13.7*  NEUTROABS 10.3*  HGB 8.8*  HCT 28.1*  MCV 86.5  PLT 469   Basic Metabolic  Panel: Recent Labs  Lab 10/23/21 1754  NA 125*  K 4.1  CL 87*  CO2 27  GLUCOSE 88  BUN 18  CREATININE 0.69  CALCIUM 8.6*   GFR: CrCl cannot be calculated (Unknown ideal weight.). Liver Function Tests: Recent Labs  Lab 10/23/21 1754  AST 13*  ALT 8  ALKPHOS 140*  BILITOT 0.8  PROT 6.9  ALBUMIN 2.3*   No results for input(s): LIPASE, AMYLASE in the last 168 hours. No results for input(s): AMMONIA in the last 168 hours. Coagulation Profile: No results for input(s): INR, PROTIME in the last 168 hours. Cardiac Enzymes: No results for input(s): CKTOTAL, CKMB, CKMBINDEX, TROPONINI in the last 168 hours. BNP (last 3 results) No results for input(s): PROBNP in the last 8760 hours. HbA1C: No results for input(s): HGBA1C in the last 72 hours. CBG: No results for input(s): GLUCAP in the last 168 hours. Lipid Profile: No results for input(s): CHOL, HDL, LDLCALC, TRIG, CHOLHDL, LDLDIRECT in the last 72 hours. Thyroid Function Tests: No results for input(s): TSH, T4TOTAL, FREET4, T3FREE, THYROIDAB in the last 72 hours. Anemia Panel: No results for input(s): VITAMINB12, FOLATE, FERRITIN, TIBC, IRON, RETICCTPCT in the last 72 hours. Urine analysis:    Component Value Date/Time   COLORURINE AMBER (A) 09/01/2021 2000   APPEARANCEUR CLEAR 09/01/2021 2000   LABSPEC 1.025 09/01/2021 2000   PHURINE 5.0 09/01/2021 2000   GLUCOSEU NEGATIVE 09/01/2021 2000   HGBUR NEGATIVE 09/01/2021 2000   BILIRUBINUR NEGATIVE 09/01/2021 [place] NEGATIVE 09/01/2021 2000   PROTEINUR 30 (A) 09/01/2021 2000   NITRITE NEGATIVE 09/01/2021 2000   LEUKOCYTESUR NEGATIVE 09/01/2021 2000   Sepsis Labs: _0 (procalcitonin:4,lacticidven:4) )No results found for this or any previous visit (from the past 240 hour(s)).   Radiological Exams on Admission: CT Angio Chest PE W and/or Wo Contrast  Result Date: 10/23/2021 CLINICAL DATA:  Dyspnea and hypoxia. Current chest radiograph shows  significant right lung airspace disease. EXAM: CT ANGIOGRAPHY CHEST WITH CONTRAST TECHNIQUE: Multidetector CT imaging of the chest was performed using the standard protocol during bolus administration of intravenous contrast. Multiplanar CT image reconstructions and MIPs were obtained to evaluate the vascular anatomy. CONTRAST:  58m OMNIPAQUE IOHEXOL 350 MG/ML SOLN COMPARISON:  10/01/2021, CT chest.  Current chest radiograph. FINDINGS: Cardiovascular: Pulmonary arteries are well opacified. There is no evidence of a pulmonary embolism. Heart is normal in size. No pericardial effusion. Mild left coronary artery calcifications. Great vessels are normal in caliber. No aortic dissection.  Mild aortic atherosclerosis. Is Mediastinum/Nodes: No neck base, mediastinal or hilar masses for enlarged lymph nodes. Trachea is unremarkable. Esophagus not well-defined, but grossly unremarkable. Lungs/Pleura: Occluded distal right main bronchus likely from secretions/mucus plugging. Most of the right lung is collapsed. Small portions of the right middle and right upper lobe are aerated. Large right pleural effusion. Left lung shows advanced emphysema. Mild dependent opacity in the left lower lobe and base of the left upper lobe lingula consistent with atelectasis. Trace left pleural effusion. Upper Abdomen: No acute abnormality. Musculoskeletal: No fracture or acute finding.  No bone lesion. Review of the MIP images confirms the above findings. IMPRESSION: 1. No evidence of a pulmonary embolism. 2. Large right pleural effusion with near complete atelectasis/collapse the right lung. 3. Distal right main bronchus is occluded. This is most likely due to mucous plugging. Endobronchial neoplasm is not excluded, however. Follow-up bronchoscopy is recommended. 4. Trace left pleural effusion with mild dependent left lower lobe and left upper lobe lingula atelectasis. 5. No evidence of pulmonary edema. 6. Advanced emphysema. Aortic  Atherosclerosis (ICD10-I70.0) and Emphysema (ICD10-J43.9). Electronically Signed   By: Lajean Manes M.D.   On: 10/23/2021 20:18   DG Chest Port 1 View  Result Date: 10/23/2021 CLINICAL DATA:  Questionable pneumonia. EXAM: PORTABLE CHEST 1 VIEW COMPARISON:  Chest x-ray 10/03/2021.  CT of the chest 10/01/2021. FINDINGS: There is complete opacification of the right hemithorax which appears similar to the prior examination. Right pleural effusion has likely mildly increased in there is increased opacification in the aerated right lower lung. Left lung is clear. There is no pneumothorax. Cardiomediastinal silhouette is within normal limits and stable. No acute fractures. IMPRESSION: 1. Increasing large right pleural effusion. 2. Increasing right lower lung airspace disease. Electronically Signed   By: Ronney Asters M.D.   On: 10/23/2021 19:38    EKG: Independently reviewed. Sinus rhythm, 1st deg AV block, QTc 498 ms.   Assessment/Plan   1. Acute on chronic hypoxic respiratory failure; large right pleural effusion; pneumonia  - Pt w/ chronic 3 Lpm requirement presents from SNF with fever and hypoxia and is found to have increased/massive right pleural effusion  - She had Rt thoracentesis on 10/03/21 (exudative; neg culture; atypical cells with acute inflammation on cytology) and completed antibiotics for PNA on 10/10/21  - Continue supplemental O2, titrated for saturation 88-92%, resume abx (Rocephin and doxy for now), culture sputum, check legionella and strep pneumo antigens, trend procalcitonin, and consult IR for thoracentesis with fluid analysis in am   2. Bronchial occlusion  - Distal right main bronchus appeared occluded on CT in ED, likely mucus plug but endobronchial neoplasm not excluded  - Discussed with patient, she would not want to undergo bronchoscopy, plan for supportive care   3. COPD  - No increased cough or sputum production  - Continue ICS/LABA/LAMA and as needed SABA    4.  HFpEF  - Appears compensated  - EF was 50% on TTE from Oct 2022  - Monitor volume status   5. PAF  - In SR on admission  - Continue amiodarone, Xarelto, and metoprolol as tolerated    6. Hyponatremia  - Serum sodium is 125 on admission, lower than recent priors  - Appears hypovolemic, check urine sodium, give 250 mL NS, and repeat serum chemistries in am    7. Depression, anxiety  - Continue Remeron, Buspar, and trazodone   8. Anemia  - She was transfused 1 unit RBC on 10/03/21  -  Hgb 8.8 on admission, appears to be stable  - No overt bleeding, monitor CBC, cautiously continue Xarelto   9. Chronic hypotension  - Stable, continue midodrine    DVT prophylaxis: Xarelto  Code Status: DNR  Level of Care: Level of care: Progressive Family Communication: No answer at daughter's listed number   Disposition Plan:  Patient is from: SNF  Anticipated d/c is to: SNF  Anticipated d/c date is: 10/26/21  Patient currently: Pending thoracentesis, improved/stable respiratory status Consults called: none  Admission status: Inpatient     Vianne Bulls, MD [facility]  10/23/2021, 8:40 PM

## 2021-10-23 NOTE — ED Notes (Signed)
Patient transported to CT 

## 2021-10-24 ENCOUNTER — Inpatient Hospital Stay (HOSPITAL_COMMUNITY): Payer: Medicare Other

## 2021-10-24 LAB — CBC
HCT: 27.2 % — ABNORMAL LOW (ref 36.0–46.0)
Hemoglobin: 8.6 g/dL — ABNORMAL LOW (ref 12.0–15.0)
MCH: 27 pg (ref 26.0–34.0)
MCHC: 31.6 g/dL (ref 30.0–36.0)
MCV: 85.5 fL (ref 80.0–100.0)
Platelets: 406 10*3/uL — ABNORMAL HIGH (ref 150–400)
RBC: 3.18 MIL/uL — ABNORMAL LOW (ref 3.87–5.11)
RDW: 17.8 % — ABNORMAL HIGH (ref 11.5–15.5)
WBC: 14.1 10*3/uL — ABNORMAL HIGH (ref 4.0–10.5)
nRBC: 0 % (ref 0.0–0.2)

## 2021-10-24 LAB — LACTATE DEHYDROGENASE, PLEURAL OR PERITONEAL FLUID: LD, Fluid: 45 U/L — ABNORMAL HIGH (ref 3–23)

## 2021-10-24 LAB — HEPATIC FUNCTION PANEL
ALT: 11 U/L (ref 0–44)
AST: 16 U/L (ref 15–41)
Albumin: 2 g/dL — ABNORMAL LOW (ref 3.5–5.0)
Alkaline Phosphatase: 122 U/L (ref 38–126)
Bilirubin, Direct: 0.3 mg/dL — ABNORMAL HIGH (ref 0.0–0.2)
Indirect Bilirubin: 0.5 mg/dL (ref 0.3–0.9)
Total Bilirubin: 0.8 mg/dL (ref 0.3–1.2)
Total Protein: 6.5 g/dL (ref 6.5–8.1)

## 2021-10-24 LAB — EXPECTORATED SPUTUM ASSESSMENT W GRAM STAIN, RFLX TO RESP C

## 2021-10-24 LAB — BASIC METABOLIC PANEL
Anion gap: 9 (ref 5–15)
BUN: 16 mg/dL (ref 8–23)
CO2: 26 mmol/L (ref 22–32)
Calcium: 8.4 mg/dL — ABNORMAL LOW (ref 8.9–10.3)
Chloride: 94 mmol/L — ABNORMAL LOW (ref 98–111)
Creatinine, Ser: 0.72 mg/dL (ref 0.44–1.00)
GFR, Estimated: >60 mL/min (ref >60)
Glucose, Bld: 78 mg/dL (ref 70–99)
Potassium: 4.7 mmol/L (ref 3.5–5.1)
Sodium: 129 mmol/L — ABNORMAL LOW (ref 135–145)

## 2021-10-24 LAB — GLUCOSE, PLEURAL OR PERITONEAL FLUID: Glucose, Fluid: 92 mg/dL

## 2021-10-24 LAB — MRSA NEXT GEN BY PCR, NASAL: MRSA by PCR Next Gen: NOT DETECTED

## 2021-10-24 LAB — LACTATE DEHYDROGENASE: LDH: 74 U/L — ABNORMAL LOW (ref 98–192)

## 2021-10-24 LAB — PROTEIN, PLEURAL OR PERITONEAL FLUID: Total protein, fluid: 4.2 g/dL

## 2021-10-24 LAB — PROCALCITONIN: Procalcitonin: 0.27 ng/mL

## 2021-10-24 MED ORDER — OXYCODONE-ACETAMINOPHEN 5-325 MG PO TABS
1.0000 | ORAL_TABLET | Freq: Four times a day (QID) | ORAL | Status: DC | PRN
  Administered 2021-10-24 – 2021-10-25 (×4): 1 via ORAL
  Filled 2021-10-24 (×4): qty 1

## 2021-10-24 MED ORDER — SODIUM CHLORIDE 0.9 % IV SOLN
2.0000 g | Freq: Two times a day (BID) | INTRAVENOUS | Status: DC
  Administered 2021-10-24 – 2021-10-28 (×9): 2 g via INTRAVENOUS
  Filled 2021-10-24 (×11): qty 2

## 2021-10-24 MED ORDER — VANCOMYCIN HCL IN DEXTROSE 1-5 GM/200ML-% IV SOLN
1000.0000 mg | Freq: Once | INTRAVENOUS | Status: AC
  Administered 2021-10-24: 1000 mg via INTRAVENOUS
  Filled 2021-10-24: qty 200

## 2021-10-24 MED ORDER — LIP MEDEX EX OINT: TOPICAL_OINTMENT | CUTANEOUS | Status: DC | PRN

## 2021-10-24 MED ORDER — LIDOCAINE HCL 1 % IJ SOLN
INTRAMUSCULAR | Status: AC
  Filled 2021-10-24: qty 20

## 2021-10-24 MED ORDER — VANCOMYCIN HCL 500 MG/100ML IV SOLN
500.0000 mg | INTRAVENOUS | Status: DC
  Administered 2021-10-25: 500 mg via INTRAVENOUS
  Filled 2021-10-24 (×2): qty 100

## 2021-10-24 MED ORDER — PROSOURCE PLUS PO LIQD
30.0000 mL | Freq: Two times a day (BID) | ORAL | Status: DC
  Filled 2021-10-24 (×6): qty 30

## 2021-10-24 MED ORDER — CHLORHEXIDINE GLUCONATE CLOTH 2 % EX PADS
6.0000 | MEDICATED_PAD | Freq: Every day | CUTANEOUS | Status: DC
  Administered 2021-10-24 – 2021-10-28 (×5): 6 via TOPICAL

## 2021-10-24 MED ORDER — RIVAROXABAN 15 MG PO TABS
15.0000 mg | ORAL_TABLET | Freq: Every day | ORAL | Status: DC
  Administered 2021-10-24 – 2021-10-28 (×5): 15 mg via ORAL
  Filled 2021-10-24 (×5): qty 1

## 2021-10-24 MED ORDER — VANCOMYCIN HCL 750 MG/150ML IV SOLN: 750.0000 mg | INTRAVENOUS | Status: DC

## 2021-10-24 MED ORDER — SODIUM CHLORIDE 0.9 % IV SOLN: INTRAVENOUS | Status: DC | PRN

## 2021-10-24 MED ORDER — SODIUM CHLORIDE 0.9 % IV BOLUS
250.0000 mL | Freq: Once | INTRAVENOUS | Status: AC
  Administered 2021-10-24: 250 mL via INTRAVENOUS

## 2021-10-24 MED ORDER — ORAL CARE MOUTH RINSE
15.0000 mL | Freq: Two times a day (BID) | OROMUCOSAL | Status: DC
  Administered 2021-10-24 – 2021-10-28 (×8): 15 mL via OROMUCOSAL

## 2021-10-24 MED ORDER — RIVAROXABAN 15 MG PO TABS: 15.0000 mg | ORAL_TABLET | Freq: Every day | ORAL | Status: DC

## 2021-10-24 MED ORDER — ONDANSETRON HCL 4 MG/2ML IJ SOLN
4.0000 mg | Freq: Four times a day (QID) | INTRAMUSCULAR | Status: DC | PRN
  Administered 2021-10-24 – 2021-10-28 (×6): 4 mg via INTRAVENOUS
  Filled 2021-10-24 (×6): qty 2

## 2021-10-24 MED ORDER — OXYCODONE HCL 5 MG PO TABS
5.0000 mg | ORAL_TABLET | Freq: Once | ORAL | Status: AC
  Administered 2021-10-24: 5 mg via ORAL
  Filled 2021-10-24: qty 1

## 2021-10-24 NOTE — Progress Notes (Signed)
This RN requested a WOC consult be placed d/t patient's stage 3 ulcers on her sacrum and coccyx. On prior admission, they were classified as stage 2 and DTPI. Awaiting orders at this time.

## 2021-10-24 NOTE — Procedures (Signed)
PROCEDURE SUMMARY:  Successful US guided diagnostic and therapeutic right thoracentesis. Yielded 1.3 L of hazy, yellow fluid. Pt tolerated procedure well. No immediate complications.  Specimen was sent for labs. CXR ordered.  EBL < 1 mL  Tyson Alias, AGNP 10/24/2021 11:47 AM

## 2021-10-24 NOTE — Plan of Care (Signed)
This RN will carefully monitor patient's progression of care plan.

## 2021-10-24 NOTE — Progress Notes (Signed)
Po amiodarone held by this RN d/t HR 58 bpm, and SBP in 90's. Dr Rodena Piety notified by this RN. MD also notified of pt complaints of nausea and chronic back pain. Orders received for Zofran and PTA percocet as requested by patient. This RN will continue to monitor patient's pain and hemodynamic status.

## 2021-10-24 NOTE — Progress Notes (Signed)
PROGRESS NOTE    Kristen [name]  TIR:[id number] DOB: 11-20-1946 DOA: 10/23/2021 PCP: [name], MD    Brief Narrative: Kristen [name] is a very pleasant 75 y.o. female with medical history significant for COPD, chronic respiratory failure, HFpEF, PAF on Xarelto, chronic hyponatremia, chronic anemia, and recent admission for acute on chronic respiratory failure secondary to pneumonia, now presenting from her SNF for evaluation of fever and hypoxia. Patient denies increased SOB, increased cough, or chills but was noted to be febrile and hypoxic at her SNF and EMS was called for transport to the ED. She denies chest pain, leg swelling, melena, or hematochezia.    ED Course: Upon arrival to the ED, patient is found to be afebrile, requiring 8 L/min of supplemental oxygen to maintain saturations in the low 90s, and with blood pressure 99/61.  EKG features sinus rhythm with first-degree AV nodal block.  Chest x-ray with increasing and large right pleural effusion with increased right lower lobe airspace disease.  Chemistry panel features a sodium of 125.  CBC with leukocytosis to 13,700 and hemoglobin 8.8.  CTA chest is negative for PE but re-demonstrates advanced emphysema and the large right pleural effusion and reveals distal right main bronchus occlusion, likely from mucous plugging but with endobronchial neoplasm not excluded.  Patient was given Rocephin and azithromycin in the ED.  Assessment & Plan:   Principal Problem:   Acute on chronic respiratory failure with hypoxia (HCC) Active Problems:   COPD (chronic obstructive pulmonary disease) (HCC)   Hypoxia   Hyponatremia   Generalized anxiety disorder   AF (paroxysmal atrial fibrillation) (HCC)   Pleural effusion on right   #1  Acute on chronic hypoxic respiratory failure with large right-sided pleural effusion/HCAP/COPD end-stage O2 dependent at 3 L 24/7.  Patient admitted with fever shortness of breath cough and hypoxia.  She was  admitted to the hospital last month with similar complaints and was treated for pneumonia with Rocephin and had thoracentesis done and was discharged back to the SNF. CT chest this admission no PE but advanced emphysema and large right pleural effusion and reveals distal right main bronchus occlusion likely from mucous plugging but with endobronchial neoplasm cannot be excluded. Pro-Cal low but with leukocytosis .  IR consulted for thoracentesis. -Antibiotics to Vanco cefepime. Patient on 8 L of oxygen maintaining her sats low 90s.  #2 persistent atrial fibrillation on amiodarone Xarelto and beta-blocker EF 50% from echo done on October 2022. 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  #7 hyponatremia improved since admission  #8 chronic hypotension on midodrine  #9 anemia she received a unit of blood transfusion last month monitor closely on Xarelto Pressure Injury 10/01/21 Coccyx Medial;Lower Stage 2 -  Partial thickness loss of dermis presenting as a shallow open injury with a red, pink wound bed without slough. (Active)  10/01/21 2026  Location: Coccyx  Location Orientation: Medial;Lower  Staging: Stage 2 -  Partial thickness loss of dermis presenting as a shallow open injury with a red, pink wound bed without slough.  Wound Description (Comments):   Present on Admission: Yes     Pressure Injury 10/08/21 Sacrum Mid Deep Tissue Pressure Injury - Purple or maroon localized area  of discolored intact skin or blood-filled blister due to damage of underlying soft tissue from pressure and/or shear. black reddened area on boney prominenc (Active)   10/08/21 1100  Location: Sacrum  Location Orientation: Mid  Staging: Deep Tissue Pressure Injury - Purple or maroon localized area of discolored intact skin or blood-filled blister due to damage of underlying soft tissue from pressure and/or shear.  Wound Description (Comments): black reddened area on boney prominence  Present on Admission: Yes     Estimated body mass index is 13.87 kg/m as calculated from the following:   Height as of 10/01/21: _0  (1.803 m).   Weight as of 10/02/21: 45.1 kg.  DVT prophylaxis: Xarelto  code Status: DNR  family Communication: None at bedside  disposition Plan:  Status is: Inpatient  Remains inpatient appropriate because: Pending thoracentesis patient on 8 L of oxygen  Consultants:  None  Procedures: None Antimicrobials: Rocephin  doxycycline Subjective:  Patient is resting in bed she is awake alert answering questions appropriately She is on 8 L of oxygen  Objective: Vitals:   10/24/21 0800 10/24/21 0830 10/24/21 0900 10/24/21 1100  BP: (!) 93/55 (!) 95/56 (!) 92/51 (!) 89/48  Pulse: 66 61 60   Resp:  17 12   Temp:      TempSrc:      SpO2: 96% 98% 99% 100%    Intake/Output Summary (Last 24 hours) at 10/24/2021 1110 Last data filed at 10/24/2021 1791 Gross per 24 hour  Intake 350 ml  Output --  Net 350 ml   There were no vitals filed for this visit.  Examination:  General exam: Appears in mild distress  Respiratory system: Coarse breath sounds on the left and diminished on the right to auscultation. Respiratory effort normal. Cardiovascular system: S1 & S2 heard, RRR. No JVD, murmurs, rubs, gallops or clicks. No pedal edema. Gastrointestinal system: Abdomen is nondistended, soft and nontender. No organomegaly or masses felt. Normal bowel sounds heard. Central nervous system: Alert and oriented. No focal neurological deficits. Extremities: Trace bilateral edema  skin: No rashes, lesions or ulcers Psychiatry: Judgement and  insight appear normal. Mood & affect appropriate.     Data Reviewed: I have personally reviewed following labs and imaging studies  CBC: Recent Labs  Lab 10/23/21 1754 10/24/21 0428  WBC 13.7* 14.1*  NEUTROABS 10.3*  --   HGB 8.8* 8.6*  HCT 28.1* 27.2*  MCV 86.5 85.5  PLT 399 505*   Basic Metabolic Panel: Recent Labs  Lab 10/23/21 1754 10/24/21 0428  NA 125* 129*  K 4.1 4.7  CL 87* 94*  CO2 27 26  GLUCOSE 88 78  BUN 18 16  CREATININE 0.69 0.72  CALCIUM 8.6* 8.4*   GFR: CrCl cannot be calculated (Unknown ideal weight.). Liver Function Tests: Recent Labs  Lab 10/23/21 1754 10/24/21 0428  AST 13* 16  ALT 8 11  ALKPHOS 140* 122  BILITOT 0.8 0.8  PROT 6.9 6.5  ALBUMIN 2.3* 2.0*   No results for input(s): LIPASE, AMYLASE in the last 168 hours. No results for input(s): AMMONIA in the last 168 hours. Coagulation Profile: No results for input(s): INR, PROTIME in the last 168 hours. Cardiac Enzymes: No results for input(s): CKTOTAL, CKMB, CKMBINDEX, TROPONINI in the last 168 hours. BNP (last 3 results) No results for input(s): PROBNP in the last 8760 hours. HbA1C: No results for input(s): HGBA1C in the last 72 hours. CBG: No results for input(s): GLUCAP in the last 168 hours. Lipid Profile:  No results for input(s): CHOL, HDL, LDLCALC, TRIG, CHOLHDL, LDLDIRECT in the last 72 hours. Thyroid Function Tests: No results for input(s): TSH, T4TOTAL, FREET4, T3FREE, THYROIDAB in the last 72 hours. Anemia Panel: No results for input(s): VITAMINB12, FOLATE, FERRITIN, TIBC, IRON, RETICCTPCT in the last 72 hours. Sepsis Labs: Recent Labs  Lab 10/23/21 2050 10/24/21 0428  PROCALCITON 0.41 0.27    Recent Results (from the past 240 hour(s))  Resp Panel by RT-PCR (Flu A&B, Covid) Nasopharyngeal Swab     Status: None   Collection Time: 10/23/21  9:59 PM   Specimen: Nasopharyngeal Swab; Nasopharyngeal(NP) swabs in vial transport medium  Result Value Ref Range Status    SARS Coronavirus 2 by RT PCR NEGATIVE NEGATIVE Final    Comment: (NOTE) SARS-CoV-2 target nucleic acids are NOT DETECTED.  The SARS-CoV-2 RNA is generally detectable in upper respiratory specimens during the acute phase of infection. The lowest concentration of SARS-CoV-2 viral copies this assay can detect is 138 copies/mL. A negative result does not preclude SARS-Cov-2 infection and should not be used as the sole basis for treatment or other patient management decisions. A negative result may occur with  improper specimen collection/handling, submission of specimen other than nasopharyngeal swab, presence of viral mutation(s) within the areas targeted by this assay, and inadequate number of viral copies(<138 copies/mL). A negative result must be combined with clinical observations, patient history, and epidemiological information. The expected result is Negative.  Fact Sheet for Patients:  EntrepreneurPulse.com.au  Fact Sheet for Healthcare Providers:  IncredibleEmployment.be  This test is no t yet approved or cleared by the [place] FDA and  has been authorized for detection and/or diagnosis of SARS-CoV-2 by FDA under an Emergency Use Authorization (EUA). This EUA will remain  in effect (meaning this test can be used) for the duration of the COVID-19 declaration under Section 564(b)(1) of the Act, 21 U.S.C.section 360bbb-3(b)(1), unless the authorization is terminated  or revoked sooner.       Influenza A by PCR NEGATIVE NEGATIVE Final   Influenza B by PCR NEGATIVE NEGATIVE Final    Comment: (NOTE) The Xpert Xpress SARS-CoV-2/FLU/RSV plus assay is intended as an aid in the diagnosis of influenza from Nasopharyngeal swab specimens and should not be used as a sole basis for treatment. Nasal washings and aspirates are unacceptable for Xpert Xpress SARS-CoV-2/FLU/RSV testing.  Fact Sheet for  Patients: EntrepreneurPulse.com.au  Fact Sheet for Healthcare Providers: IncredibleEmployment.be  This test is not yet approved or cleared by the [place] FDA and has been authorized for detection and/or diagnosis of SARS-CoV-2 by FDA under an Emergency Use Authorization (EUA). This EUA will remain in effect (meaning this test can be used) for the duration of the COVID-19 declaration under Section 564(b)(1) of the Act, 21 U.S.C. section 360bbb-3(b)(1), unless the authorization is terminated or revoked.  Performed at [facility], [place] [address], [place], [place] [address]   Expectorated Sputum Assessment w Gram Stain, Rflx to Resp Cult     Status: None   Collection Time: 10/24/21  5:21 AM   Specimen: Expectorated Sputum  Result Value Ref Range Status   Specimen Description EXPECTORATED SPUTUM  Final   Special Requests NONE  Final   Sputum evaluation   Final    THIS SPECIMEN IS ACCEPTABLE FOR SPUTUM CULTURE Performed at [facility], [place] [address], [place],  [address]    Report Status 10/24/2021 FINAL  Final         Radiology Studies:  CT Angio Chest PE W and/or Wo Contrast  Result Date: 10/23/2021 CLINICAL DATA:  Dyspnea and hypoxia. Current chest radiograph shows significant right lung airspace disease. EXAM: CT ANGIOGRAPHY CHEST WITH CONTRAST TECHNIQUE: Multidetector CT imaging of the chest was performed using the standard protocol during bolus administration of intravenous contrast. Multiplanar CT image reconstructions and MIPs were obtained to evaluate the vascular anatomy. CONTRAST:  27m OMNIPAQUE IOHEXOL 350 MG/ML SOLN COMPARISON:  10/01/2021, CT chest.  Current chest radiograph. FINDINGS: Cardiovascular: Pulmonary arteries are well opacified. There is no evidence of a pulmonary embolism. Heart is normal in size. No pericardial effusion. Mild left coronary artery calcifications. Great  vessels are normal in caliber. No aortic dissection. Mild aortic atherosclerosis. Is Mediastinum/Nodes: No neck base, mediastinal or hilar masses for enlarged lymph nodes. Trachea is unremarkable. Esophagus not well-defined, but grossly unremarkable. Lungs/Pleura: Occluded distal right main bronchus likely from secretions/mucus plugging. Most of the right lung is collapsed. Small portions of the right middle and right upper lobe are aerated. Large right pleural effusion. Left lung shows advanced emphysema. Mild dependent opacity in the left lower lobe and base of the left upper lobe lingula consistent with atelectasis. Trace left pleural effusion. Upper Abdomen: No acute abnormality. Musculoskeletal: No fracture or acute finding.  No bone lesion. Review of the MIP images confirms the above findings. IMPRESSION: 1. No evidence of a pulmonary embolism. 2. Large right pleural effusion with near complete atelectasis/collapse the right lung. 3. Distal right main bronchus is occluded. This is most likely due to mucous plugging. Endobronchial neoplasm is not excluded, however. Follow-up bronchoscopy is recommended. 4. Trace left pleural effusion with mild dependent left lower lobe and left upper lobe lingula atelectasis. 5. No evidence of pulmonary edema. 6. Advanced emphysema. Aortic Atherosclerosis (ICD10-I70.0) and Emphysema (ICD10-J43.9). Electronically Signed   By: DLajean ManesM.D.   On: 10/23/2021 20:18   DG Chest Port 1 View  Result Date: 10/23/2021 CLINICAL DATA:  Questionable pneumonia. EXAM: PORTABLE CHEST 1 VIEW COMPARISON:  Chest x-ray 10/03/2021.  CT of the chest 10/01/2021. FINDINGS: There is complete opacification of the right hemithorax which appears similar to the prior examination. Right pleural effusion has likely mildly increased in there is increased opacification in the aerated right lower lung. Left lung is clear. There is no pneumothorax. Cardiomediastinal silhouette is within normal limits  and stable. No acute fractures. IMPRESSION: 1. Increasing large right pleural effusion. 2. Increasing right lower lung airspace disease. Electronically Signed   By: ARonney AstersM.D.   On: 10/23/2021 19:38        Scheduled Meds:  (feeding supplement) PROSource Plus  30 mL Oral BID BM   amiodarone  200 mg Oral Daily   busPIRone  10 mg Oral Daily   clopidogrel  75 mg Oral Daily   lidocaine       metoprolol succinate  12.5 mg Oral Daily   midodrine  10 mg Oral TID WC   mirtazapine  15 mg Oral QHS   mometasone-formoterol  1 puff Inhalation BID   pantoprazole  40 mg Oral QODAY   Rivaroxaban  15 mg Oral Q supper   sodium chloride flush  3 mL Intravenous Q12H   umeclidinium bromide  1 puff Inhalation Daily   Continuous Infusions:  cefTRIAXone (ROCEPHIN)  IV     doxycycline (VIBRAMYCIN) IV Stopped (10/23/21 2253)     LOS: 1 day   EGeorgette Shell MD 10/24/2021, 11:10 AM

## 2021-10-24 NOTE — Progress Notes (Addendum)
Pharmacy Antibiotic Note  Kristen [name] is a 75 y.o. female with hx COPD, pleural effusion and acute on chronic respiratory failure who was recently hospitalized and discharged on 10/09/21.  She presented back to the ED on 10/23/2021 with c/o fever. Chest CT on 12/1 was negative for PE, but showed a "large right pleural effusion with near complete atelectasis/collapse the right lung." She was started on ceftriaxone and doxycycline on admission.  Pharmacy has been consulted on 12/222 to dose vancomycin and cefepime for PNA.   Plan: - d/c ceftriaxone - start cefepime 2 gm IV q12h - vancomycin 1000 mg IV x1, then 500 mg q24h (est AUC 473) - doxycycline 100 mg q12h per MD  _____________________________________________  Temp (24hrs), Avg:98.2 F (36.8 C), Min:97.3 F (36.3 C), Max:99.5 F (37.5 C)  Recent Labs  Lab 10/23/21 1754 10/24/21 0428  WBC 13.7* 14.1*  CREATININE 0.69 0.72    CrCl cannot be calculated (Unknown ideal weight.).    Allergies  Allergen Reactions   Gabapentin Other (See Comments)    Hallucination/ nightmares- "Allergic," per [facility]     Thank you for allowing pharmacy to be a part of this patient's care.  Lynelle Doctor 10/24/2021 12:03 PM

## 2021-10-25 LAB — CBC
HCT: 24.3 % — ABNORMAL LOW (ref 36.0–46.0)
Hemoglobin: 7.7 g/dL — ABNORMAL LOW (ref 12.0–15.0)
MCH: 26.8 pg (ref 26.0–34.0)
MCHC: 31.7 g/dL (ref 30.0–36.0)
MCV: 84.7 fL (ref 80.0–100.0)
Platelets: 434 10*3/uL — ABNORMAL HIGH (ref 150–400)
RBC: 2.87 MIL/uL — ABNORMAL LOW (ref 3.87–5.11)
RDW: 17.6 % — ABNORMAL HIGH (ref 11.5–15.5)
WBC: 11.3 10*3/uL — ABNORMAL HIGH (ref 4.0–10.5)
nRBC: 0 % (ref 0.0–0.2)

## 2021-10-25 LAB — BASIC METABOLIC PANEL
Anion gap: 7 (ref 5–15)
BUN: 15 mg/dL (ref 8–23)
CO2: 25 mmol/L (ref 22–32)
Calcium: 8 mg/dL — ABNORMAL LOW (ref 8.9–10.3)
Chloride: 95 mmol/L — ABNORMAL LOW (ref 98–111)
Creatinine, Ser: 0.69 mg/dL (ref 0.44–1.00)
GFR, Estimated: >60 mL/min (ref >60)
Glucose, Bld: 90 mg/dL (ref 70–99)
Potassium: 3.9 mmol/L (ref 3.5–5.1)
Sodium: 127 mmol/L — ABNORMAL LOW (ref 135–145)

## 2021-10-25 LAB — PROCALCITONIN: Procalcitonin: 0.3 ng/mL

## 2021-10-25 MED ORDER — ALBUTEROL SULFATE (2.5 MG/3ML) 0.083% IN NEBU
2.5000 mg | INHALATION_SOLUTION | Freq: Two times a day (BID) | RESPIRATORY_TRACT | Status: DC
  Administered 2021-10-25 – 2021-10-28 (×7): 2.5 mg via RESPIRATORY_TRACT
  Filled 2021-10-25 (×7): qty 3

## 2021-10-25 MED ORDER — UMECLIDINIUM BROMIDE 62.5 MCG/ACT IN AEPB
1.0000 | INHALATION_SPRAY | Freq: Every day | RESPIRATORY_TRACT | Status: DC
  Administered 2021-10-25 – 2021-10-28 (×4): 1 via RESPIRATORY_TRACT

## 2021-10-25 MED ORDER — OXYCODONE-ACETAMINOPHEN 5-325 MG PO TABS
1.0000 | ORAL_TABLET | ORAL | Status: DC | PRN
  Administered 2021-10-25 – 2021-10-28 (×10): 1 via ORAL
  Filled 2021-10-25 (×10): qty 1

## 2021-10-25 NOTE — Progress Notes (Signed)
PROGRESS NOTE    Kristen [name]  KPT:[id number] DOB: [birth year] DOA: 10/23/2021 PCP: [name], MD    Brief Narrative: Kristen [name] is a very pleasant 75 y.o. female with medical history significant for COPD, chronic respiratory failure, HFpEF, PAF on Xarelto, chronic hyponatremia, chronic anemia, and recent admission for acute on chronic respiratory failure secondary to pneumonia, now presenting from her SNF for evaluation of fever and hypoxia. Patient denies increased SOB, increased cough, or chills but was noted to be febrile and hypoxic at her SNF and EMS was called for transport to the ED. She denies chest pain, leg swelling, melena, or hematochezia.    ED Course: Upon arrival to the ED, patient is found to be afebrile, requiring 8 L/min of supplemental oxygen to maintain saturations in the low 90s, and with blood pressure 99/61.  EKG features sinus rhythm with first-degree AV nodal block.  Chest x-ray with increasing and large right pleural effusion with increased right lower lobe airspace disease.  Chemistry panel features a sodium of 125.  CBC with leukocytosis to 13,700 and hemoglobin 8.8.  CTA chest is negative for PE but re-demonstrates advanced emphysema and the large right pleural effusion and reveals distal right main bronchus occlusion, likely from mucous plugging but with endobronchial neoplasm not excluded.  Patient was given Rocephin and azithromycin in the ED.  Assessment & Plan:   Principal Problem:   Acute on chronic respiratory failure with hypoxia (HCC) Active Problems:   COPD (chronic obstructive pulmonary disease) (HCC)   Hypoxia   Hyponatremia   Generalized anxiety disorder   AF (paroxysmal atrial fibrillation) (HCC)   Pleural effusion on right   #1  Acute on chronic hypoxic respiratory failure with large right-sided pleural effusion/HCAP/COPD end-stage O2 dependent at 3 L 24/7.  Patient admitted with fever shortness of breath cough and hypoxia.  She was  admitted to the hospital last month with similar complaints and was treated for pneumonia with Rocephin and had thoracentesis done and was discharged back to the SNF. CT chest this admission no PE but advanced emphysema and large right pleural effusion and reveals distal right main bronchus occlusion likely from mucous plugging but with endobronchial neoplasm cannot be excluded. Pro-Cal low but with leukocytosis.  Status postthoracentesis 1.5 L of hazy fluid removed 10/24/2021.  Pleural fluid studies consistent with exudate. Vanco/ cefepime. Patient was on 8 L of oxygen on admission.  Currently down to 3 L.  #2 persistent atrial fibrillation on amiodarone Xarelto and beta-blocker EF 50% from echo done on October 2022. 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  #7 hyponatremia improved since admission  #8 chronic hypotension on midodrine  #9 anemia she received a unit of blood transfusion last month monitor closely on Xarelto  #10 multiple pressure injuries present on admission wound care consulted. Pressure Injury 10/01/21 Coccyx Medial;Lower Stage 2 -  Partial thickness loss of dermis presenting as a shallow open injury with a red, pink wound bed without slough. (Active)  10/01/21 2026  Location: Coccyx  Location Orientation: Medial;Lower  Staging: Stage 2 -  Partial thickness loss of dermis presenting as a shallow open injury with a red, pink wound bed without slough.  Wound Description (Comments):  Present on Admission: Yes     Pressure Injury 10/08/21 Sacrum Mid Deep Tissue Pressure Injury - Purple or maroon localized area of discolored  intact skin or blood-filled blister due to damage of underlying soft tissue from pressure and/or shear. black reddened area on boney prominenc (Active)  10/08/21 1100  Location: Sacrum  Location Orientation: Mid  Staging: Deep Tissue Pressure Injury - Purple or maroon localized area of discolored intact skin or blood-filled blister due to damage of underlying soft tissue from pressure and/or shear.  Wound Description (Comments): black reddened area on boney prominence  Present on Admission: Yes     Estimated body mass index is 12.62 kg/m as calculated from the following:   Height as of this encounter: 5' 10" (1.778 m).   Weight as of this encounter: 39.9 kg.  DVT prophylaxis: Xarelto  code Status: DNR  family Communication: None at bedside  disposition Plan:  Status is: Inpatient  Remains inpatient appropriate because: Pending thoracentesis patient on 8 L of oxygen  Consultants:  None  Procedures: None Antimicrobials: Rocephin  doxycycline Subjective:  Patient resting in bed she reports that she feels better after thoracentesis Objective: Vitals:   10/25/21 0900 10/25/21 1000 10/25/21 1100 10/25/21 1200  BP: (!) 146/52 (!) 87/48 (!) 91/42   Pulse: 66 69 63   Resp: _0 Temp:    98 F (36.7 C)  TempSrc:    Oral  SpO2: 95% 100% 100%   Weight:      Height:        Intake/Output Summary (Last 24 hours) at 10/25/2021 1436 Last data filed at 10/25/2021 1100 Gross per 24 hour  Intake 1137.19 ml  Output 900 ml  Net 237.19 ml    Filed Weights   10/24/21 1231  Weight: 39.9 kg    Examination:  General exam: Appears in mild distress  Respiratory system: Coarse breath sounds on the left and diminished on the right to auscultation. Respiratory effort normal. Cardiovascular system: S1 & S2 heard, RRR. No JVD, murmurs, rubs, gallops or clicks. No pedal edema. Gastrointestinal system: Abdomen is nondistended, soft and nontender. No organomegaly or masses felt. Normal  bowel sounds heard. Central nervous system: Alert and oriented. No focal neurological deficits. Extremities: Trace bilateral edema  skin: No rashes, lesions or ulcers Psychiatry: Judgement and insight appear normal. Mood & affect appropriate.     Data Reviewed: I have personally reviewed following labs and imaging studies  CBC: Recent Labs  Lab 10/23/21 1754 10/24/21 0428 10/25/21 0315  WBC 13.7* 14.1* 11.3*  NEUTROABS 10.3*  --   --   HGB 8.8* 8.6* 7.7*  HCT 28.1* 27.2* 24.3*  MCV 86.5 85.5 84.7  PLT 399 406* 434*    Basic Metabolic Panel: Recent Labs  Lab 10/23/21 1754 10/24/21 0428 10/25/21 0315  NA 125* 129* 127*  K 4.1 4.7 3.9  CL 87* 94* 95*  CO2 _1 GLUCOSE 88 78 90  BUN _2 CREATININE 0.69 0.72 0.69  CALCIUM 8.6* 8.4* 8.0*    GFR: Estimated Creatinine Clearance: 37.7 mL/min (by C-G formula based on SCr of 0.69 mg/dL). Liver Function Tests: Recent Labs  Lab 10/23/21 1754 10/24/21 0428  AST 13* 16  ALT 8 11  ALKPHOS 140* 122  BILITOT 0.8 0.8  PROT 6.9 6.5  ALBUMIN 2.3* 2.0*    No results for input(s): LIPASE, AMYLASE in the last 168 hours. No results for input(s): AMMONIA in the last 168  hours. Coagulation Profile: No results for input(s): INR, PROTIME in the last 168 hours. Cardiac Enzymes: No results for input(s): CKTOTAL, CKMB, CKMBINDEX, TROPONINI in the last 168 hours. BNP (last 3 results) No results for input(s): PROBNP in the last 8760 hours. HbA1C: No results for input(s): HGBA1C in the last 72 hours. CBG: No results for input(s): GLUCAP in the last 168 hours. Lipid Profile: No results for input(s): CHOL, HDL, LDLCALC, TRIG, CHOLHDL, LDLDIRECT in the last 72 hours. Thyroid Function Tests: No results for input(s): TSH, T4TOTAL, FREET4, T3FREE, THYROIDAB in the last 72 hours. Anemia Panel: No results for input(s): VITAMINB12, FOLATE, FERRITIN, TIBC, IRON, RETICCTPCT in the last 72 hours. Sepsis Labs: Recent Labs  Lab  10/23/21 2050 10/24/21 0428 10/25/21 0315  PROCALCITON 0.41 0.27 0.30     Recent Results (from the past 240 hour(s))  Resp Panel by RT-PCR (Flu A&B, Covid) Nasopharyngeal Swab     Status: None   Collection Time: 10/23/21  9:59 PM   Specimen: Nasopharyngeal Swab; Nasopharyngeal(NP) swabs in vial transport medium  Result Value Ref Range Status   SARS Coronavirus 2 by RT PCR NEGATIVE NEGATIVE Final    Comment: (NOTE) SARS-CoV-2 target nucleic acids are NOT DETECTED.  The SARS-CoV-2 RNA is generally detectable in upper respiratory specimens during the acute phase of infection. The lowest concentration of SARS-CoV-2 viral copies this assay can detect is 138 copies/mL. A negative result does not preclude SARS-Cov-2 infection and should not be used as the sole basis for treatment or other patient management decisions. A negative result may occur with  improper specimen collection/handling, submission of specimen other than nasopharyngeal swab, presence of viral mutation(s) within the areas targeted by this assay, and inadequate number of viral copies(<138 copies/mL). A negative result must be combined with clinical observations, patient history, and epidemiological information. The expected result is Negative.  Fact Sheet for Patients:  EntrepreneurPulse.com.au  Fact Sheet for Healthcare Providers:  IncredibleEmployment.be  This test is no t yet approved or cleared by the [place] FDA and  has been authorized for detection and/or diagnosis of SARS-CoV-2 by FDA under an Emergency Use Authorization (EUA). This EUA will remain  in effect (meaning this test can be used) for the duration of the COVID-19 declaration under Section 564(b)(1) of the Act, 21 U.S.C.section 360bbb-3(b)(1), unless the authorization is terminated  or revoked sooner.       Influenza A by PCR NEGATIVE NEGATIVE Final   Influenza B by PCR NEGATIVE NEGATIVE Final     Comment: (NOTE) The Xpert Xpress SARS-CoV-2/FLU/RSV plus assay is intended as an aid in the diagnosis of influenza from Nasopharyngeal swab specimens and should not be used as a sole basis for treatment. Nasal washings and aspirates are unacceptable for Xpert Xpress SARS-CoV-2/FLU/RSV testing.  Fact Sheet for Patients: EntrepreneurPulse.com.au  Fact Sheet for Healthcare Providers: IncredibleEmployment.be  This test is not yet approved or cleared by the [place] FDA and has been authorized for detection and/or diagnosis of SARS-CoV-2 by FDA under an Emergency Use Authorization (EUA). This EUA will remain in effect (meaning this test can be used) for the duration of the COVID-19 declaration under Section 564(b)(1) of the Act, 21 U.S.C. section 360bbb-3(b)(1), unless the authorization is terminated or revoked.  Performed at [facility], [place] [address] [place], [place] [address]   Expectorated Sputum Assessment w Gram Stain, Rflx to Resp Cult     Status: None   Collection Time: 10/24/21  5:21 AM   Specimen: Expectorated  Sputum  Result Value Ref Range Status   Specimen Description EXPECTORATED SPUTUM  Final   Special Requests NONE  Final   Sputum evaluation   Final    THIS SPECIMEN IS ACCEPTABLE FOR SPUTUM CULTURE Performed at [place] [place] [address] [place], [place] [address]    Report Status 10/24/2021 FINAL  Final  Culture, Respiratory w Gram Stain     Status: None (Preliminary result)   Collection Time: 10/24/21  5:21 AM  Result Value Ref Range Status   Specimen Description   Final    EXPECTORATED SPUTUM Performed at [facility] [place] [address] [place], [place] [address]    Special Requests   Final    NONE Reflexed from [id number] Performed at [facility], [place] [address], [place], [place] [address]    Gram Stain   Final    ABUNDANT WBC PRESENT,  PREDOMINANTLY PMN FEW GRAM POSITIVE COCCI IN PAIRS IN CHAINS    Culture   Final    FEW STAPHYLOCOCCUS AUREUS CULTURE REINCUBATED FOR BETTER GROWTH Performed at [facility], [place] [address], [place], [place] [address]    Report Status PENDING  Incomplete  MRSA Next Gen by PCR, Nasal     Status: None   Collection Time: 10/24/21 11:30 AM   Specimen: Nasal Mucosa; Nasal Swab  Result Value Ref Range Status   MRSA by PCR Next Gen NOT DETECTED NOT DETECTED Final    Comment: (NOTE) The GeneXpert MRSA Assay (FDA approved for NASAL specimens only), is one component of a comprehensive MRSA colonization surveillance program. It is not intended to diagnose MRSA infection nor to guide or monitor treatment for MRSA infections. Test performance is not FDA approved in patients less than 64 years old. Performed at [facility], [place] [address] [place], [place] [address]   Body fluid culture w Gram Stain     Status: None (Preliminary result)   Collection Time: 10/24/21 12:06 PM   Specimen: Lung, Right; Pleural Fluid  Result Value Ref Range Status   Specimen Description   Final    PLEURAL RIGHT Performed at [facility] [address] [place], [place] [address]    Special Requests   Final    NONE Performed at [facility], [place] [address] [place], [place] [address]    Gram Stain   Final    NO SQUAMOUS EPITHELIAL CELLS SEEN MODERATE WBC SEEN NO ORGANISMS SEEN CYTOSPIN SMEAR    Culture   Final    NO GROWTH < 24 HOURS Performed at [facility], [address], [place],  [address]    Report Status PENDING  Incomplete          Radiology Studies: DG Chest 1 View  Result Date: 10/24/2021 CLINICAL DATA:  Status post thoracentesis EXAM: CHEST  1 VIEW COMPARISON:  10/23/2021 FINDINGS: Status post right thoracentesis with diminished volume of a right pleural effusion and significant improvement in aeration of the  lower right lung. There remains a large, dense, masslike consolidation of the right upper lobe. Emphysema of the left lung. Heart and mediastinum are unremarkable. IMPRESSION: 1. Status post right thoracentesis with diminished volume of a right pleural effusion and significant improvement in aeration of the lower right lung. There remains a large, dense, masslike consolidation of the right upper lobe. 2. No pneumothorax. 3. Emphysema. Electronically Signed   By: Delanna Ahmadi M.D.   On: 10/24/2021 12:02  CT Angio Chest PE W and/or Wo Contrast  Result Date: 10/23/2021 CLINICAL DATA:  Dyspnea and hypoxia. Current chest radiograph shows significant right lung airspace disease. EXAM: CT ANGIOGRAPHY CHEST WITH CONTRAST TECHNIQUE: Multidetector CT imaging of the chest was performed using the standard protocol during bolus administration of intravenous contrast. Multiplanar CT image reconstructions and MIPs were obtained to evaluate the vascular anatomy. CONTRAST:  36m OMNIPAQUE IOHEXOL 350 MG/ML SOLN COMPARISON:  10/01/2021, CT chest.  Current chest radiograph. FINDINGS: Cardiovascular: Pulmonary arteries are well opacified. There is no evidence of a pulmonary embolism. Heart is normal in size. No pericardial effusion. Mild left coronary artery calcifications. Great vessels are normal in caliber. No aortic dissection. Mild aortic atherosclerosis. Is Mediastinum/Nodes: No neck base, mediastinal or hilar masses for enlarged lymph nodes. Trachea is unremarkable. Esophagus not well-defined, but grossly unremarkable. Lungs/Pleura: Occluded distal right main bronchus likely from secretions/mucus plugging. Most of the right lung is collapsed. Small portions of the right middle and right upper lobe are aerated. Large right pleural effusion. Left lung shows advanced emphysema. Mild dependent opacity in the left lower lobe and base of the left upper lobe lingula consistent with atelectasis. Trace left pleural effusion. Upper  Abdomen: No acute abnormality. Musculoskeletal: No fracture or acute finding.  No bone lesion. Review of the MIP images confirms the above findings. IMPRESSION: 1. No evidence of a pulmonary embolism. 2. Large right pleural effusion with near complete atelectasis/collapse the right lung. 3. Distal right main bronchus is occluded. This is most likely due to mucous plugging. Endobronchial neoplasm is not excluded, however. Follow-up bronchoscopy is recommended. 4. Trace left pleural effusion with mild dependent left lower lobe and left upper lobe lingula atelectasis. 5. No evidence of pulmonary edema. 6. Advanced emphysema. Aortic Atherosclerosis (ICD10-I70.0) and Emphysema (ICD10-J43.9). Electronically Signed   By: DLajean ManesM.D.   On: 10/23/2021 20:18   DG Chest Port 1 View  Result Date: 10/23/2021 CLINICAL DATA:  Questionable pneumonia. EXAM: PORTABLE CHEST 1 VIEW COMPARISON:  Chest x-ray 10/03/2021.  CT of the chest 10/01/2021. FINDINGS: There is complete opacification of the right hemithorax which appears similar to the prior examination. Right pleural effusion has likely mildly increased in there is increased opacification in the aerated right lower lung. Left lung is clear. There is no pneumothorax. Cardiomediastinal silhouette is within normal limits and stable. No acute fractures. IMPRESSION: 1. Increasing large right pleural effusion. 2. Increasing right lower lung airspace disease. Electronically Signed   By: ARonney AstersM.D.   On: 10/23/2021 19:38   [place]ORACENTESIS ASP PLEURAL SPACE W/IMG GUIDE  Result Date: 10/24/2021 INDICATION: Patient with history of COPD, chronic respiratory failure, AFib, pneumonia and hypoxia. Request for IR to perform diagnostic and therapeutic thoracentesis for large right pleural effusion. EXAM: ULTRASOUND GUIDED DIAGNOSTIC AND THERAPEUTIC THORACENTESIS MEDICATIONS: 190m1% lidocaine COMPLICATIONS: None immediate. PROCEDURE: An ultrasound guided thoracentesis was  thoroughly discussed with the patient and questions answered. The benefits, risks, alternatives and complications were also discussed. The patient understands and wishes to proceed with the procedure. Written consent was obtained. Ultrasound was performed to localize and mark an adequate pocket of fluid in the right chest. The area was then prepped and draped in the normal sterile fashion. 1% Lidocaine was used for local anesthesia. Under ultrasound guidance a 6 Fr Safe-T-Centesis catheter was introduced. Thoracentesis was performed. The catheter was removed and a dressing applied. FINDINGS: A total of approximately 1.3 L of hazy, yellow fluid was removed. Samples were sent to the  laboratory as requested by the clinical team. IMPRESSION: Successful ultrasound guided right thoracentesis yielding 1.3 L of pleural fluid. Read by: Narda Rutherford, NP Electronically Signed   By: Miachel Roux M.D.   On: 10/24/2021 12:03        Scheduled Meds:  (feeding supplement) PROSource Plus  30 mL Oral BID BM   albuterol  2.5 mg Nebulization BID   amiodarone  200 mg Oral Daily   busPIRone  10 mg Oral Daily   Chlorhexidine Gluconate Cloth  6 each Topical Daily   clopidogrel  75 mg Oral Daily   mouth rinse  15 mL Mouth Rinse BID   metoprolol succinate  12.5 mg Oral Daily   midodrine  10 mg Oral TID WC   mirtazapine  15 mg Oral QHS   mometasone-formoterol  1 puff Inhalation BID   pantoprazole  40 mg Oral QODAY   Rivaroxaban  15 mg Oral Q supper   sodium chloride flush  3 mL Intravenous Q12H   umeclidinium bromide  1 puff Inhalation Daily   Continuous Infusions:  sodium chloride Stopped (10/25/21 0821)   ceFEPime (MAXIPIME) IV Stopped (10/25/21 1221)   doxycycline (VIBRAMYCIN) IV Stopped (10/25/21 1011)   vancomycin 500 mg (10/25/21 1331)     LOS: 2 days   Georgette Shell, MD 10/25/2021, 2:36 PM

## 2021-10-26 LAB — BASIC METABOLIC PANEL
Anion gap: 5 (ref 5–15)
BUN: 16 mg/dL (ref 8–23)
CO2: 24 mmol/L (ref 22–32)
Calcium: 8.2 mg/dL — ABNORMAL LOW (ref 8.9–10.3)
Chloride: 99 mmol/L (ref 98–111)
Creatinine, Ser: 0.69 mg/dL (ref 0.44–1.00)
GFR, Estimated: >60 mL/min (ref >60)
Glucose, Bld: 95 mg/dL (ref 70–99)
Potassium: 3.8 mmol/L (ref 3.5–5.1)
Sodium: 128 mmol/L — ABNORMAL LOW (ref 135–145)

## 2021-10-26 LAB — CBC
HCT: 23.1 % — ABNORMAL LOW (ref 36.0–46.0)
Hemoglobin: 7.4 g/dL — ABNORMAL LOW (ref 12.0–15.0)
MCH: 27 pg (ref 26.0–34.0)
MCHC: 32 g/dL (ref 30.0–36.0)
MCV: 84.3 fL (ref 80.0–100.0)
Platelets: 390 10*3/uL (ref 150–400)
RBC: 2.74 MIL/uL — ABNORMAL LOW (ref 3.87–5.11)
RDW: 17.6 % — ABNORMAL HIGH (ref 11.5–15.5)
WBC: 8.5 10*3/uL (ref 4.0–10.5)
nRBC: 0 % (ref 0.0–0.2)

## 2021-10-26 MED ORDER — ASCORBIC ACID 500 MG PO TABS
500.0000 mg | ORAL_TABLET | Freq: Two times a day (BID) | ORAL | Status: DC
  Administered 2021-10-26 – 2021-10-28 (×4): 500 mg via ORAL
  Filled 2021-10-26 (×4): qty 1

## 2021-10-26 MED ORDER — ADULT MULTIVITAMIN W/MINERALS CH
1.0000 | ORAL_TABLET | Freq: Every day | ORAL | Status: DC
  Administered 2021-10-27 – 2021-10-28 (×2): 1 via ORAL
  Filled 2021-10-26 (×2): qty 1

## 2021-10-26 MED ORDER — ENSURE ENLIVE PO LIQD
237.0000 mL | Freq: Three times a day (TID) | ORAL | Status: DC
  Administered 2021-10-26 – 2021-10-28 (×5): 237 mL via ORAL

## 2021-10-26 NOTE — Progress Notes (Signed)
PROGRESS NOTE    Kristen [name]  UEK:[id number] DOB: 1946-03-31 DOA: 10/23/2021 PCP: [name], MD    Brief Narrative: Kristen [name] is a very pleasant 75 y.o. female with medical history significant for COPD, chronic respiratory failure, HFpEF, PAF on Xarelto, chronic hyponatremia, chronic anemia, and recent admission for acute on chronic respiratory failure secondary to pneumonia, now presenting from her SNF for evaluation of fever and hypoxia. Patient denies increased SOB, increased cough, or chills but was noted to be febrile and hypoxic at her SNF and EMS was called for transport to the ED. She denies chest pain, leg swelling, melena, or hematochezia.    ED Course: Upon arrival to the ED, patient is found to be afebrile, requiring 8 L/min of supplemental oxygen to maintain saturations in the low 90s, and with blood pressure 99/61.  EKG features sinus rhythm with first-degree AV nodal block.  Chest x-ray with increasing and large right pleural effusion with increased right lower lobe airspace disease.  Chemistry panel features a sodium of 125.  CBC with leukocytosis to 13,700 and hemoglobin 8.8.  CTA chest is negative for PE but re-demonstrates advanced emphysema and the large right pleural effusion and reveals distal right main bronchus occlusion, likely from mucous plugging but with endobronchial neoplasm not excluded.  Patient was given Rocephin and azithromycin in the ED.  Assessment & Plan:   Principal Problem:   Acute on chronic respiratory failure with hypoxia (HCC) Active Problems:   COPD (chronic obstructive pulmonary disease) (HCC)   Hypoxia   Hyponatremia   Generalized anxiety disorder   AF (paroxysmal atrial fibrillation) (HCC)   Pleural effusion on right   #1  Acute on chronic hypoxic respiratory failure with large right-sided pleural effusion/HCAP/COPD end-stage O2 dependent at 3 L 24/7.  Patient admitted with fever shortness of breath cough and hypoxia.  She was  admitted to the hospital last month with similar complaints and was treated for pneumonia with Rocephin and had thoracentesis done and was discharged back to the SNF.  CT chest this admission no PE but advanced emphysema and large right pleural effusion and reveals distal right main bronchus occlusion likely from mucous plugging but with endobronchial neoplasm cannot be excluded. Pro-Cal low but with leukocytosis.  Status postthoracentesis 1.5 L of hazy fluid removed 10/24/2021.  Pleural fluid studies consistent with exudate. Vanco/ cefepime. Patient was on 8 L of oxygen on admission.  Currently down to 3 L. Hope to discharge her back to nursing home tomorrow 10/27/2021.  #2 persistent atrial fibrillation on amiodarone Xarelto and beta-blocker EF 50% from echo done on October 2022.  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  #7 hyponatremia improved since admission sodium 128.  #8 chronic hypotension on midodrine  #9 anemia she received a unit of blood transfusion last month monitor closely on Xarelto  #10 multiple pressure injuries present on admission wound care consulted. Pressure Injury 10/01/21 Coccyx Medial;Lower Stage 2 -  Partial thickness loss of dermis presenting as a shallow open injury with a red, pink wound bed without slough. (Active)  10/01/21 2026  Location: Coccyx  Location Orientation: Medial;Lower  Staging: Stage 2 -  Partial thickness loss of dermis presenting as a shallow  open injury with a red, pink wound bed without slough.  Wound Description (Comments):   Present on Admission: Yes     Pressure Injury 10/08/21  Sacrum Mid Deep Tissue Pressure Injury - Purple or maroon localized area of discolored intact skin or blood-filled blister due to damage of underlying soft tissue from pressure and/or shear. black reddened area on boney prominenc (Active)  10/08/21 1100  Location: Sacrum  Location Orientation: Mid  Staging: Deep Tissue Pressure Injury - Purple or maroon localized area of discolored intact skin or blood-filled blister due to damage of underlying soft tissue from pressure and/or shear.  Wound Description (Comments): black reddened area on boney prominence  Present on Admission: Yes     Estimated body mass index is 12.62 kg/m as calculated from the following:   Height as of this encounter: 5' 10" (1.778 m).   Weight as of this encounter: 39.9 kg.  DVT prophylaxis: Xarelto  code Status: DNR  family Communication: None at bedside  disposition Plan:  Status is: Inpatient  Remains inpatient appropriate because: Pending thoracentesis patient on 8 L of oxygen  Consultants:  None  Procedures: None Antimicrobials: Rocephin  doxycycline Subjective:  She is resting in bed now back down on 3 L of oxygen White count down to 8.5 from 14.1 sodium 128 she feels she is back to her baseline after thoracentesis and IV antibiotics  Objective: Vitals:   10/26/21 1000 10/26/21 1100 10/26/21 1200 10/26/21 1303  BP: (!) 79/35 (!) 85/39 (!) 104/54 113/64  Pulse: 66 67 65 (!) 59  Resp: _0 Temp:    98.3 F (36.8 C)  TempSrc:    Oral  SpO2: 100% 100% 98% 100%  Weight:      Height:        Intake/Output Summary (Last 24 hours) at 10/26/2021 1344 Last data filed at 10/26/2021 1200 Gross per 24 hour  Intake 813.26 ml  Output 1650 ml  Net -836.74 ml    Filed Weights   10/24/21 1231  Weight: 39.9 kg    Examination:  General exam: Appears no acute distress chronically ill looking cachectic appearing female Respiratory system: Coarse breath sounds on the left and diminished on the  right to auscultation. Respiratory effort normal. Cardiovascular system: S1 & S2 heard, RRR. No JVD, murmurs, rubs, gallops or clicks. No pedal edema. Gastrointestinal system: Abdomen is nondistended, soft and nontender. No organomegaly or masses felt. Normal bowel sounds heard. Central nervous system: Alert and oriented. No focal neurological deficits. Extremities: Trace bilateral edema  skin: No rashes, lesions or ulcers Psychiatry: Judgement and insight appear normal. Mood & affect appropriate.     Data Reviewed: I have personally reviewed following labs and imaging studies  CBC: Recent Labs  Lab 10/23/21 1754 10/24/21 0428 10/25/21 0315 10/26/21 0311  WBC 13.7* 14.1* 11.3* 8.5  NEUTROABS 10.3*  --   --   --   HGB 8.8* 8.6* 7.7* 7.4*  HCT 28.1* 27.2* 24.3* 23.1*  MCV 86.5 85.5 84.7 84.3  PLT 399 406* 434* 786    Basic Metabolic Panel: Recent Labs  Lab 10/23/21 1754 10/24/21 0428 10/25/21 0315 10/26/21 0311  NA 125* 129* 127* 128*  K 4.1 4.7 3.9 3.8  CL 87* 94* 95* 99  CO2 _1 GLUCOSE 88 78 90 95  BUN _2 CREATININE 0.69 0.72 0.69 0.69  CALCIUM 8.6* 8.4* 8.0* 8.2*    GFR: Estimated Creatinine Clearance: 37.7 mL/min (by  C-G formula based on SCr of 0.69 mg/dL). Liver Function Tests: Recent Labs  Lab 10/23/21 1754 10/24/21 0428  AST 13* 16  ALT 8 11  ALKPHOS 140* 122  BILITOT 0.8 0.8  PROT 6.9 6.5  ALBUMIN 2.3* 2.0*    No results for input(s): LIPASE, AMYLASE in the last 168 hours. No results for input(s): AMMONIA in the last 168 hours. Coagulation Profile: No results for input(s): INR, PROTIME in the last 168 hours. Cardiac Enzymes: No results for input(s): CKTOTAL, CKMB, CKMBINDEX, TROPONINI in the last 168 hours. BNP (last 3 results) No results for input(s): PROBNP in the last 8760 hours. HbA1C: No results for input(s): HGBA1C in the last 72 hours. CBG: No results for input(s): GLUCAP in the last 168 hours. Lipid Profile: No  results for input(s): CHOL, HDL, LDLCALC, TRIG, CHOLHDL, LDLDIRECT in the last 72 hours. Thyroid Function Tests: No results for input(s): TSH, T4TOTAL, FREET4, T3FREE, THYROIDAB in the last 72 hours. Anemia Panel: No results for input(s): VITAMINB12, FOLATE, FERRITIN, TIBC, IRON, RETICCTPCT in the last 72 hours. Sepsis Labs: Recent Labs  Lab 10/23/21 2050 10/24/21 0428 10/25/21 0315  PROCALCITON 0.41 0.27 0.30     Recent Results (from the past 240 hour(s))  Resp Panel by RT-PCR (Flu A&B, Covid) Nasopharyngeal Swab     Status: None   Collection Time: 10/23/21  9:59 PM   Specimen: Nasopharyngeal Swab; Nasopharyngeal(NP) swabs in vial transport medium  Result Value Ref Range Status   SARS Coronavirus 2 by RT PCR NEGATIVE NEGATIVE Final    Comment: (NOTE) SARS-CoV-2 target nucleic acids are NOT DETECTED.  The SARS-CoV-2 RNA is generally detectable in upper respiratory specimens during the acute phase of infection. The lowest concentration of SARS-CoV-2 viral copies this assay can detect is 138 copies/mL. A negative result does not preclude SARS-Cov-2 infection and should not be used as the sole basis for treatment or other patient management decisions. A negative result may occur with  improper specimen collection/handling, submission of specimen other than nasopharyngeal swab, presence of viral mutation(s) within the areas targeted by this assay, and inadequate number of viral copies(<138 copies/mL). A negative result must be combined with clinical observations, patient history, and epidemiological information. The expected result is Negative.  Fact Sheet for Patients:  EntrepreneurPulse.com.au  Fact Sheet for Healthcare Providers:  IncredibleEmployment.be  This test is no t yet approved or cleared by the [place] FDA and  has been authorized for detection and/or diagnosis of SARS-CoV-2 by FDA under an Emergency Use Authorization  (EUA). This EUA will remain  in effect (meaning this test can be used) for the duration of the COVID-19 declaration under Section 564(b)(1) of the Act, 21 U.S.C.section 360bbb-3(b)(1), unless the authorization is terminated  or revoked sooner.       Influenza A by PCR NEGATIVE NEGATIVE Final   Influenza B by PCR NEGATIVE NEGATIVE Final    Comment: (NOTE) The Xpert Xpress SARS-CoV-2/FLU/RSV plus assay is intended as an aid in the diagnosis of influenza from Nasopharyngeal swab specimens and should not be used as a sole basis for treatment. Nasal washings and aspirates are unacceptable for Xpert Xpress SARS-CoV-2/FLU/RSV testing.  Fact Sheet for Patients: EntrepreneurPulse.com.au  Fact Sheet for Healthcare Providers: IncredibleEmployment.be  This test is not yet approved or cleared by the [place] FDA and has been authorized for detection and/or diagnosis of SARS-CoV-2 by FDA under an Emergency Use Authorization (EUA). This EUA will remain in effect (meaning this test can be used) for the  duration of the COVID-19 declaration under Section 564(b)(1) of the Act, 21 U.S.C. section 360bbb-3(b)(1), unless the authorization is terminated or revoked.  Performed at [facility], [place] [address], [place], [place] [address]   Expectorated Sputum Assessment w Gram Stain, Rflx to Resp Cult     Status: None   Collection Time: 10/24/21  5:21 AM   Specimen: Expectorated Sputum  Result Value Ref Range Status   Specimen Description EXPECTORATED SPUTUM  Final   Special Requests NONE  Final   Sputum evaluation   Final    THIS SPECIMEN IS ACCEPTABLE FOR SPUTUM CULTURE Performed at [facility], [place] [address] [place], [place] [address]    Report Status 10/24/2021 FINAL  Final  Culture, Respiratory w Gram Stain     Status: None (Preliminary result)   Collection Time: 10/24/21  5:21 AM  Result Value Ref Range Status    Specimen Description   Final    EXPECTORATED SPUTUM Performed at [place] [address], [place], [place] [address]    Special Requests   Final    NONE Reflexed from [id number] Performed at [facility], [place] [address] [place], [place] [address]    Gram Stain   Final    ABUNDANT WBC PRESENT, PREDOMINANTLY PMN FEW GRAM POSITIVE COCCI IN PAIRS IN CHAINS    Culture   Final    FEW STAPHYLOCOCCUS AUREUS SUSCEPTIBILITIES TO FOLLOW Performed at [facility], [place] [address] [place], [place] [address]    Report Status PENDING  Incomplete  MRSA Next Gen by PCR, Nasal     Status: None   Collection Time: 10/24/21 11:30 AM   Specimen: Nasal Mucosa; Nasal Swab  Result Value Ref Range Status   MRSA by PCR Next Gen NOT DETECTED NOT DETECTED Final    Comment: (NOTE) The GeneXpert MRSA Assay (FDA approved for NASAL specimens only), is one component of a comprehensive MRSA colonization surveillance program. It is not intended to diagnose MRSA infection nor to guide or monitor treatment for MRSA infections. Test performance is not FDA approved in patients less than 40 years old. Performed at [facility], [place] [address], [place], [place] [address]   Body fluid culture w Gram Stain     Status: None (Preliminary result)   Collection Time: 10/24/21 12:06 PM   Specimen: Lung, Right; Pleural Fluid  Result Value Ref Range Status   Specimen Description   Final    PLEURAL RIGHT Performed at [place] [address], [place], [place] [address]    Special Requests   Final    NONE Performed at [facility], [place] [address] [place], [place] [address]    Gram Stain   Final    NO SQUAMOUS EPITHELIAL CELLS SEEN MODERATE WBC SEEN NO ORGANISMS SEEN CYTOSPIN SMEAR    Culture   Final    NO GROWTH 2 DAYS Performed at [facility], [address] [place], [place] [address]    Report  Status PENDING  Incomplete          Radiology Studies: No results found.      Scheduled Meds:  (feeding supplement) PROSource Plus  30 mL Oral BID BM   albuterol  2.5 mg Nebulization BID   amiodarone  200 mg Oral Daily   busPIRone  10 mg Oral Daily   Chlorhexidine Gluconate Cloth  6 each Topical Daily   clopidogrel  75  mg Oral Daily   mouth rinse  15 mL Mouth Rinse BID   midodrine  10 mg Oral TID WC   mirtazapine  15 mg Oral QHS   mometasone-formoterol  1 puff Inhalation BID   pantoprazole  40 mg Oral QODAY   Rivaroxaban  15 mg Oral Q supper   sodium chloride flush  3 mL Intravenous Q12H   umeclidinium bromide  1 puff Inhalation Daily   Continuous Infusions:  sodium chloride Stopped (10/26/21 1042)   ceFEPime (MAXIPIME) IV Stopped (10/26/21 1318)   doxycycline (VIBRAMYCIN) IV Stopped (10/26/21 1317)     LOS: 3 days   Georgette Shell, MD 10/26/2021, 1:44 PM

## 2021-10-26 NOTE — Plan of Care (Signed)
Kristen [name] transferred from the ICU this afternoon. Her VS have been stable. She received percocet x 1 for her chronic back pain and BP remained stable. Kristen [name] has been encouraged to change position at least q 2 hrs d/t the wounds on her sacrum but tends to prefer supine position despite reinforcing the need for turning for wound healing.  Problem: Education: Goal: Knowledge of General Education information will improve Description: Including pain rating scale, medication(s)/side effects and non-pharmacologic comfort measures Outcome: Progressing   Problem: Health Behavior/Discharge Planning: Goal: Ability to manage health-related needs will improve Outcome: Progressing   Problem: Clinical Measurements: Goal: Ability to maintain clinical measurements within normal limits will improve Outcome: Progressing Goal: Will remain free from infection Outcome: Progressing Goal: Diagnostic test results will improve Outcome: Progressing Goal: Respiratory complications will improve Outcome: Progressing Goal: Cardiovascular complication will be avoided Outcome: Progressing   Problem: Activity: Goal: Risk for activity intolerance will decrease Outcome: Progressing   Problem: Nutrition: Goal: Adequate nutrition will be maintained Outcome: Progressing   Problem: Coping: Goal: Level of anxiety will decrease Outcome: Progressing   Problem: Pain Managment: Goal: General experience of comfort will improve Outcome: Progressing   Problem: Safety: Goal: Ability to remain free from injury will improve Outcome: Progressing   Problem: Skin Integrity: Goal: Risk for impaired skin integrity will decrease Outcome: Progressing   Problem: Education: Goal: Knowledge of disease or condition will improve Outcome: Progressing Goal: Knowledge of the prescribed therapeutic regimen will improve Outcome: Progressing Goal: Individualized Educational Video(s) Outcome: Progressing   Problem: Activity: Goal:  Ability to tolerate increased activity will improve Outcome: Progressing Goal: Will verbalize the importance of balancing activity with adequate rest periods Outcome: Progressing   Problem: Respiratory: Goal: Ability to maintain a clear airway will improve Outcome: Progressing Goal: Levels of oxygenation will improve Outcome: Progressing Goal: Ability to maintain adequate ventilation will improve Outcome: Progressing

## 2021-10-26 NOTE — Progress Notes (Signed)
Initial Nutrition Assessment  DOCUMENTATION CODES:   Severe malnutrition in context of chronic illness  INTERVENTION:   Ensure Enlive po TID, each supplement provides 350 kcal and 20 grams of protein  MVI po daily   Vitamin C 526m po BID  Pt at high refeed risk; recommend monitor potassium, magnesium and phosphorus labs daily until stable  NUTRITION DIAGNOSIS:   Severe Malnutrition related to chronic illness (COPD) as evidenced by severe fat depletion, severe muscle depletion.  GOAL:   Patient will meet greater than or equal to 90% of their needs  MONITOR:   PO intake, Supplement acceptance, Labs, Weight trends, Skin, I & O's  REASON FOR ASSESSMENT:   Malnutrition Screening Tool    ASSESSMENT:   75y.o. female with medical history significant for COPD, chronic respiratory failure, HFpEF, PAF on Xarelto, chronic hyponatremia, chronic anemia, depression, anxiety, CVA and recent admission for acute on chronic respiratory failure secondary to pneumonia who is now admitted from SNF for evaluation of fever and hypoxia.  RD working remotely.  Spoke with pt via phone. Pt reports good appetite and oral intake at baseline; pt reports that she eats well, she just does not eat enough. Pt reports that she enjoys strawberry and vanilla Ensure. Pt reports eating 100% of a big breakfast this morning and reports eating chicken, rice and a sweet potato for lunch today. RD discussed with pt the importance of adequate nutrition needed to preserve lean muscle and to support wound healing. RD will add supplements and vitamins to help pt meet her estimated needs. Per chart, pt is down 11lbs(11%) from her last weight on 11/10; pt denies any weight loss. RD unsure if bed weights are correct. Pt previously diagnosed with severe malnutrition on 11/10. RD will obtain nutrition related exam at follow up.    Medications reviewed and include: plavix, mirtazapine, protonix, cefepime, doxycycline   Labs  reviewed: Na 128(L) Hgb 7.4(L), Hct 23.1(L)  Nutrition-Focused physical exam completed on 11/10. Findings are severe fat depletions, severe muscle depletions and no edema.   Diet Order:   Diet Order             Diet regular Room service appropriate? Yes; Fluid consistency: Thin  Diet effective now                  EDUCATION NEEDS:   No education needs have been identified at this time  Skin:  Skin Assessment: Reviewed RN Assessment (Stage III coccyx and sacrum)  Last BM:  12/3- type 6  Height:   Ht Readings from Last 1 Encounters:  10/24/21 5' 10" (1.778 m)    Weight:   Wt Readings from Last 1 Encounters:  10/24/21 39.9 kg    Ideal Body Weight:  68 kg  BMI:  Body mass index is 12.62 kg/m.  Estimated Nutritional Needs:   Kcal:  1400-1600kcal/day  Protein:  70-80g/day  Fluid:  1.0-1.2L/day  CKoleen DistanceMS, RD, LDN Please refer to [facility]for RD and/or RD on-call/weekend/after hours pager

## 2021-10-27 LAB — CULTURE, RESPIRATORY W GRAM STAIN

## 2021-10-27 LAB — CYTOLOGY - NON PAP

## 2021-10-27 LAB — COMPREHENSIVE METABOLIC PANEL
ALT: 10 U/L (ref 0–44)
AST: 11 U/L — ABNORMAL LOW (ref 15–41)
Albumin: 1.9 g/dL — ABNORMAL LOW (ref 3.5–5.0)
Alkaline Phosphatase: 117 U/L (ref 38–126)
Anion gap: 9 (ref 5–15)
BUN: 18 mg/dL (ref 8–23)
CO2: 27 mmol/L (ref 22–32)
Calcium: 8.6 mg/dL — ABNORMAL LOW (ref 8.9–10.3)
Chloride: 96 mmol/L — ABNORMAL LOW (ref 98–111)
Creatinine, Ser: 0.62 mg/dL (ref 0.44–1.00)
GFR, Estimated: >60 mL/min (ref >60)
Glucose, Bld: 114 mg/dL — ABNORMAL HIGH (ref 70–99)
Potassium: 4 mmol/L (ref 3.5–5.1)
Sodium: 132 mmol/L — ABNORMAL LOW (ref 135–145)
Total Bilirubin: 0.4 mg/dL (ref 0.3–1.2)
Total Protein: 5.9 g/dL — ABNORMAL LOW (ref 6.5–8.1)

## 2021-10-27 LAB — CBC
HCT: 25.2 % — ABNORMAL LOW (ref 36.0–46.0)
Hemoglobin: 7.7 g/dL — ABNORMAL LOW (ref 12.0–15.0)
MCH: 26.2 pg (ref 26.0–34.0)
MCHC: 30.6 g/dL (ref 30.0–36.0)
MCV: 85.7 fL (ref 80.0–100.0)
Platelets: 441 10*3/uL — ABNORMAL HIGH (ref 150–400)
RBC: 2.94 MIL/uL — ABNORMAL LOW (ref 3.87–5.11)
RDW: 17.8 % — ABNORMAL HIGH (ref 11.5–15.5)
WBC: 9.3 10*3/uL (ref 4.0–10.5)
nRBC: 0 % (ref 0.0–0.2)

## 2021-10-27 LAB — RESP PANEL BY RT-PCR (FLU A&B, COVID) ARPGX2
Influenza A by PCR: NEGATIVE
Influenza B by PCR: NEGATIVE
SARS Coronavirus 2 by RT PCR: NEGATIVE

## 2021-10-27 MED ORDER — DOXYCYCLINE HYCLATE 100 MG PO TABS
100.0000 mg | ORAL_TABLET | Freq: Two times a day (BID) | ORAL | Status: DC
  Administered 2021-10-27 – 2021-10-28 (×3): 100 mg via ORAL
  Filled 2021-10-27 (×3): qty 1

## 2021-10-27 MED ORDER — DAKINS (1/4 STRENGTH) 0.125 % EX SOLN: Freq: Two times a day (BID) | CUTANEOUS | Status: DC

## 2021-10-27 MED ORDER — DAKINS (1/4 STRENGTH) 0.125 % EX SOLN
Freq: Two times a day (BID) | CUTANEOUS | Status: DC
  Filled 2021-10-27: qty 473

## 2021-10-27 NOTE — Progress Notes (Signed)
Pharmacy Antibiotic Note  Kristen [name] is a 75 y.o. female with hx COPD, pleural effusion and acute on chronic respiratory failure who was recently hospitalized and discharged on 10/09/21.  She presented back to the ED on 10/23/2021 with c/o fever. Chest CT on 12/1 was negative for PE, but showed a "large right pleural effusion with near complete atelectasis/collapse the right lung."   10/27/2021 D5 abx. AF, WBC WNL  Plan: - continue cefepime 2 gm IV q12h - consider changing to cefdinir 300 mg po BID - doxycycline 100 mg q12h. Changed to PO   _____________________________________________  Temp (24hrs), Avg:98.2 F (36.8 C), Min:97.8 F (36.6 C), Max:98.5 F (36.9 C)  Recent Labs  Lab 10/23/21 1754 10/24/21 0428 10/25/21 0315 10/26/21 0311  WBC 13.7* 14.1* 11.3* 8.5  CREATININE 0.69 0.72 0.69 0.69     Estimated Creatinine Clearance: 37.7 mL/min (by C-G formula based on SCr of 0.69 mg/dL).    Allergies  Allergen Reactions   Gabapentin Other (See Comments)    Hallucination/ nightmares- "Allergic," per [facility]    12/1 Doxy >>  12/1 CTX >>12/5 12/2 cefepime>> 12/2 vanc>>12/3  12/2 sputum: Few Staph aureus 12/2 Pleural Fl Cx: no organisms seen 12/2 MRSA PCR: neg  Thank you for allowing pharmacy to be a part of this patient's care.  Eudelia Bunch, Pharm.D 10/27/2021 8:33 AM

## 2021-10-27 NOTE — TOC Initial Note (Signed)
Transition of Care [facility]) - Initial/Assessment Note    Patient Details  Name: Kristen [name] MRN: [id number] Date of Birth: [birth year]  Transition of Care [facility]) CM/SW Contact:    Dessa Phi, RN Phone Number: 10/27/2021, 1:19 PM  Clinical Narrative:From [address]-patient plans to d/c back-[facility] SNF. DNR, on 02,sacral,coccyx wounds-WOC following. Will start auth once PT eval.covid results neg.                 Expected Discharge Plan: [place] Barriers to Discharge: Continued Medical Work up   Patient Goals and CMS Choice Patient states their goals for this hospitalization and ongoing recovery are:: return back to [facility] CMS Medicare.gov Compare Post Acute Care list provided to:: Patient Choice offered to / list presented to : Patient  Expected Discharge Plan and Services Expected Discharge Plan: [place] arrangements for the past 2 months: [place]                                      Prior Living Arrangements/Services Living arrangements for the past 2 months: [place] Lives with:: Self Patient language and need for interpreter reviewed:: Yes Do you feel safe going back to the place where you live?: Yes      Need for Family Participation in Patient Care: Yes (Comment) Care giver support system in place?: Yes (comment)   Criminal Activity/Legal Involvement Pertinent to Current Situation/Hospitalization: No - Comment as needed  Activities of Daily Living Home Assistive Devices/Equipment: Eyeglasses, Oxygen, Nebulizer ADL Screening (condition at time of admission) Patient's cognitive ability adequate to safely complete daily activities?: Yes Is the patient deaf or have difficulty hearing?: No Does the patient have difficulty seeing, even when wearing glasses/contacts?: No Does the patient have difficulty concentrating, remembering, or making decisions?: No Patient able to express need  for assistance with ADLs?: Yes Does the patient have difficulty dressing or bathing?: Yes Independently performs ADLs?: No Communication: Independent Dressing (OT): Needs assistance Is this a change from baseline?: Pre-admission baseline Grooming: Independent Feeding: Independent Bathing: Needs assistance Is this a change from baseline?: Pre-admission baseline Toileting: Needs assistance Is this a change from baseline?: Pre-admission baseline In/Out Bed: Needs assistance Is this a change from baseline?: Pre-admission baseline Walks in Home: Dependent Is this a change from baseline?: Pre-admission baseline Does the patient have difficulty walking or climbing stairs?: Yes Weakness of Legs: Both Weakness of Arms/Hands: Both  Permission Sought/Granted Permission sought to share information with : Case Manager Permission granted to share information with : Yes, Verbal Permission Granted  Share Information with NAME: Izora Gala           Emotional Assessment Appearance:: Appears stated age Attitude/Demeanor/Rapport: Gracious Affect (typically observed): Accepting Orientation: : Oriented to Self, Oriented to Place, Oriented to  Time, Oriented to Situation Alcohol / Substance Use: Not Applicable Psych Involvement: No (comment)  Admission diagnosis:  SOB (shortness of breath) [R06.02] Pleural effusion [J90] Hypoxia [R09.02] Status post thoracentesis [Z98.890] Pleural effusion, right [J90] Acute on chronic respiratory failure with hypoxia (HCC) [J96.21] Patient Active Problem List   Diagnosis Date Noted   Acute on chronic respiratory failure with hypoxia (HCC) 10/23/2021   Pleural effusion on right 10/23/2021   HAP (hospital-acquired pneumonia) 10/01/2021   Elevated troponin    Sepsis ([place]) 09/03/2021   Protein-calorie malnutrition, severe 09/03/2021   Community acquired pneumonia 09/01/2021  Diarrhea 09/01/2021   Chronic respiratory failure with hypoxia (HCC) 09/01/2021    Hypotension    Pressure injury of skin 06/08/2021   CAP (community acquired pneumonia) 05/29/2021   Aspiration pneumonia ([place]) 05/29/2021   Atrial fibrillation with RVR ([place]) 05/27/2021   Cellulitis of left lower extremity 05/06/2021   Aortic atherosclerosis ([place]) 03/07/2021   Opiate use 03/05/2021   Need for assessment by dentistry for poor dentition 03/05/2021   Abdominal pain 02/06/2021   Health care maintenance 12/27/2020   AF (paroxysmal atrial fibrillation) ([place]) 12/16/2020   Nausea 11/07/2020   Lower extremity ulceration ([place]) 10/04/2020   Generalized anxiety disorder 08/27/2020   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[place])    Smoking 07/18/2020   Venous insufficiency of both lower extremities 07/18/2020   Acute respiratory failure with hypoxia ([place]) 07/01/2020   COPD (chronic obstructive pulmonary disease) ([place]) 06/28/2020   Hypoxia 06/28/2020   Protein-calorie malnutrition ([place]) 06/28/2020   Hyponatremia 06/28/2020   PCP:  [name], MD Pharmacy:   [id number] [place] [place] [address]Phone: [phone]: [phone]    Social Determinants of Health (SDOH) Interventions    Readmission Risk Interventions Readmission Risk Prevention Plan 06/17/2021 06/02/2021 09/29/2020  Transportation Screening Complete Complete Complete  PCP or Specialist Appt within 5-7 Days - - Complete  Home Care Screening - - Complete  Medication Review (RN CM) - - Complete  Medication Review ([place] - Complete -  PCP or Specialist appointment within 3-5 days of discharge - Complete -  [place]- Complete -  SW Recovery Care/Counseling Consult Complete Complete -  Palliative Care Screening Complete Complete -  Skilled Nursing Facility Complete Complete -  Some recent data might be hidden

## 2021-10-27 NOTE — Consult Note (Signed)
WOC Nurse Consult Note: Patient receiving care in [facility]. Consult completed remotely after review of record and images. Reason for Consult: unstageable sacral and coccyx wounds Wound type:  unstageable PIs Pressure Injury POA: Yes Measurement: To be provided by the bedside RN in the flowsheet section  Wound bed: both with yellowish slough Drainage (amount, consistency, odor)  Periwound: erythematous--see photos Dressing procedure/placement/frequency: Due to the national unavailability of Santyl, I have ordered the following: Place Dakin's moistened gauze over the wounds only (not the intact skin) on the sacrum and coccyx. Then cover with an ABD pad. Tape in place.  Monitor the wound area(s) for worsening of condition such as: Signs/symptoms of infection,  Increase in size,  Development of or worsening of odor, Development of pain, or increased pain at the affected locations.  Notify the medical team if any of these develop.  Thank you for the consult.  [place] nurse will not follow at this time.  Please re-consult the [place] team if needed.  Val Riles, RN, MSN, CWOCN, CNS-BC, pager [phone]

## 2021-10-27 NOTE — Progress Notes (Signed)
PROGRESS NOTE    [name]  OIT:[id number] DOB: 11-19-1946 DOA: 10/23/2021 PCP: [name], MD    Brief Narrative: Kristen [name] is a very pleasant 75 y.o. female with medical history significant for COPD, chronic respiratory failure, HFpEF, PAF on Xarelto, chronic hyponatremia, chronic anemia, and recent admission for acute on chronic respiratory failure secondary to pneumonia, now presenting from her SNF for evaluation of fever and hypoxia. Patient denies increased SOB, increased cough, or chills but was noted to be febrile and hypoxic at her SNF and EMS was called for transport to the ED. She denies chest pain, leg swelling, melena, or hematochezia.    ED Course: Upon arrival to the ED, patient is found to be afebrile, requiring 8 L/min of supplemental oxygen to maintain saturations in the low 90s, and with blood pressure 99/61.  EKG features sinus rhythm with first-degree AV nodal block.  Chest x-ray with increasing and large right pleural effusion with increased right lower lobe airspace disease.  Chemistry panel features a sodium of 125.  CBC with leukocytosis to 13,700 and hemoglobin 8.8.  CTA chest is negative for PE but re-demonstrates advanced emphysema and the large right pleural effusion and reveals distal right main bronchus occlusion, likely from mucous plugging but with endobronchial neoplasm not excluded.  Patient was given Rocephin and azithromycin in the ED.  Assessment & Plan:   Principal Problem:   Acute on chronic respiratory failure with hypoxia (HCC) Active Problems:   COPD (chronic obstructive pulmonary disease) (HCC)   Hypoxia   Hyponatremia   Generalized anxiety disorder   AF (paroxysmal atrial fibrillation) (HCC)   Pleural effusion on right   #1  Acute on chronic hypoxic respiratory failure with large right-sided pleural effusion/HCAP/COPD end-stage O2 dependent at 3 L 24/7.  Patient admitted with fever shortness of breath cough and hypoxia.  She was  admitted to the hospital last month with similar complaints and was treated for pneumonia with Rocephin and had thoracentesis done and was discharged back to the SNF.  CT chest this admission no PE but advanced emphysema and large right pleural effusion and reveals distal right main bronchus occlusion likely from mucous plugging but with endobronchial neoplasm cannot be excluded. Pro-Cal low but with leukocytosis.  Status postthoracentesis 1.5 L of hazy fluid removed 10/24/2021.  Pleural fluid studies consistent with exudate. Vanco/ cefepime. Patient was on 8 L of oxygen on admission.  Currently down to 3 L. Hope to discharge her back to nursing home tomorrow 10/28/2021.  #2 persistent atrial fibrillation on amiodarone Xarelto  EF 50% from echo done on October 2022.  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  #7 hyponatremia improved since admission sodium 128.  #8 chronic hypotension on midodrine  #9 anemia she received a unit of blood transfusion last month monitor closely on Xarelto  #10 multiple pressure injuries present on admission wound care consulted. Pressure Injury 10/26/21 Sacrum Medial Unstageable - Full thickness tissue loss in which the base of the injury is covered by slough (yellow, tan, gray, green or brown) and/or eschar (tan, brown or black) in the wound bed. previously classified as DTI, r (Active)  10/26/21 1330  Location: Sacrum  Location Orientation: Medial  Staging: Unstageable - Full thickness tissue loss in which the base of the injury is covered by slough (yellow, tan, gray, green or brown) and/or eschar (tan,  brown or black) in the wound bed.  Wound Description (Comments): previously classified as DTI, reclassified as unstageable per WOC.  Present on Admission: Yes     Pressure Injury 10/26/21 Coccyx Medial Unstageable - Full thickness tissue loss in which the base of the injury is covered by slough (yellow, tan, gray, green or brown) and/or eschar (tan, brown or black) in the wound bed. previously classified as stage  (Active)  10/26/21 1330  Location: Coccyx  Location Orientation: Medial  Staging: Unstageable - Full thickness tissue loss in which the base of the injury is covered by slough (yellow, tan, gray, green or brown) and/or eschar (tan, brown or black) in the wound bed.  Wound Description (Comments): previously classified as stage II on admission, reclassified as unstageable per WOC  Present on Admission: Yes     Estimated body mass index is 12.62 kg/m as calculated from the following:   Height as of this encounter: 5' 10" (1.778 m).   Weight as of this encounter: 39.9 kg.  DVT prophylaxis: Xarelto  code Status: DNR  family Communication: None at bedside  disposition Plan:  Status is: Inpatient  Remains inpatient appropriate because: Pending thoracentesis patient on 8 L of oxygen  Consultants:  None  Procedures: None Antimicrobials: Rocephin  doxycycline Subjective: She feels like she is getting a cold.  She is on  3 L of oxygen down to her baseline requirement.  Objective: Vitals:   10/27/21 0549 10/27/21 0809 10/27/21 0812 10/27/21 0937  BP: 121/65   106/60  Pulse: (!) 58   70  Resp: 20   20  Temp: 97.8 F (36.6 C)   97.9 F (36.6 C)  TempSrc: Oral   Oral  SpO2: 100% 98% 98% 99%  Weight:      Height:        Intake/Output Summary (Last 24 hours) at 10/27/2021 1256 Last data filed at 10/27/2021 0941 Gross per 24 hour  Intake 663.38 ml  Output 650 ml  Net 13.38 ml    Filed Weights   10/24/21 1231  Weight: 39.9 kg    Examination:  General exam: Appears  no acute distress chronically ill looking cachectic appearing female Respiratory system: Coarse breath sounds on the left and diminished on the right to auscultation. Respiratory effort normal. Cardiovascular system: S1 & S2 heard, RRR. No JVD, murmurs, rubs, gallops or clicks. No pedal edema. Gastrointestinal system: Abdomen is nondistended, soft and nontender. No organomegaly or masses felt. Normal bowel sounds heard. Central nervous system: Alert and oriented. No focal neurological deficits. Extremities: Trace bilateral edema  skin: No rashes, lesions or ulcers Psychiatry: Judgement and insight appear normal. Mood & affect appropriate.     Data Reviewed: I have personally reviewed following labs and imaging studies  CBC: Recent Labs  Lab 10/23/21 1754 10/24/21 0428 10/25/21 0315 10/26/21 0311  WBC 13.7* 14.1* 11.3* 8.5  NEUTROABS 10.3*  --   --   --   HGB 8.8* 8.6* 7.7* 7.4*  HCT 28.1* 27.2* 24.3* 23.1*  MCV 86.5 85.5 84.7 84.3  PLT 399 406* 434* 614    Basic Metabolic Panel: Recent Labs  Lab 10/23/21 1754 10/24/21 0428 10/25/21 0315 10/26/21 0311  NA 125* 129* 127* 128*  K 4.1 4.7 3.9 3.8  CL 87* 94* 95* 99  CO2 _0 24  GLUCOSE 88 78 90 95  BUN _0 CREATININE 0.69 0.72 0.69 0.69  CALCIUM 8.6* 8.4* 8.0* 8.2*    GFR: Estimated Creatinine Clearance: 37.7 mL/min (by C-G formula based on SCr of 0.69 mg/dL). Liver Function Tests: Recent Labs  Lab 10/23/21 1754 10/24/21 0428  AST 13* 16  ALT 8 11  ALKPHOS 140* 122  BILITOT 0.8 0.8  PROT 6.9 6.5  ALBUMIN 2.3* 2.0*    No results for input(s): LIPASE, AMYLASE in the last 168 hours. No results for input(s): AMMONIA in the last 168 hours. Coagulation Profile: No results for input(s): INR, PROTIME in the last 168 hours. Cardiac Enzymes: No results for input(s): CKTOTAL, CKMB, CKMBINDEX, TROPONINI in the last 168 hours. BNP (last 3 results) No results for input(s): PROBNP in the last 8760  hours. HbA1C: No results for input(s): HGBA1C in the last 72 hours. CBG: No results for input(s): GLUCAP in the last 168 hours. Lipid Profile: No results for input(s): CHOL, HDL, LDLCALC, TRIG, CHOLHDL, LDLDIRECT in the last 72 hours. Thyroid Function Tests: No results for input(s): TSH, T4TOTAL, FREET4, T3FREE, THYROIDAB in the last 72 hours. Anemia Panel: No results for input(s): VITAMINB12, FOLATE, FERRITIN, TIBC, IRON, RETICCTPCT in the last 72 hours. Sepsis Labs: Recent Labs  Lab 10/23/21 2050 10/24/21 0428 10/25/21 0315  PROCALCITON 0.41 0.27 0.30     Recent Results (from the past 240 hour(s))  Resp Panel by RT-PCR (Flu A&B, Covid) Nasopharyngeal Swab     Status: None   Collection Time: 10/23/21  9:59 PM   Specimen: Nasopharyngeal Swab; Nasopharyngeal(NP) swabs in vial transport medium  Result Value Ref Range Status   SARS Coronavirus 2 by RT PCR NEGATIVE NEGATIVE Final    Comment: (NOTE) SARS-CoV-2 target nucleic acids are NOT DETECTED.  The SARS-CoV-2 RNA is generally detectable in upper respiratory specimens during the acute phase of infection. The lowest concentration of SARS-CoV-2 viral copies this assay can detect is 138 copies/mL. A negative result does not preclude SARS-Cov-2 infection and should not be used as the sole basis for treatment or other patient management decisions. A negative result may occur with  improper specimen collection/handling, submission of specimen other than nasopharyngeal swab, presence of viral mutation(s) within the areas targeted by this assay, and inadequate number of viral copies(<138 copies/mL). A negative result must be combined with clinical observations, patient history, and epidemiological information. The expected result is Negative.  Fact Sheet for Patients:  EntrepreneurPulse.com.au  Fact Sheet for Healthcare Providers:  IncredibleEmployment.be  This test is no t yet approved or  cleared by the [place] FDA and  has been authorized for detection and/or diagnosis of SARS-CoV-2 by FDA under an Emergency Use Authorization (EUA). This EUA will remain  in effect (meaning this test can be used) for the duration of the COVID-19 declaration under Section 564(b)(1) of the Act, 21 U.S.C.section 360bbb-3(b)(1), unless the authorization is terminated  or revoked sooner.       Influenza A by PCR NEGATIVE NEGATIVE Final   Influenza B by PCR NEGATIVE NEGATIVE Final    Comment: (NOTE) The Xpert Xpress SARS-CoV-2/FLU/RSV plus assay is intended as an aid in the diagnosis of influenza from Nasopharyngeal swab specimens and should not be used as a sole basis for treatment. Nasal washings and aspirates are unacceptable for Xpert Xpress SARS-CoV-2/FLU/RSV testing.  Fact Sheet for Patients: EntrepreneurPulse.com.au  Fact Sheet for Healthcare Providers: IncredibleEmployment.be  This test is not yet approved or cleared by the [place]  FDA and has been authorized for detection and/or diagnosis of SARS-CoV-2 by FDA under an Emergency Use Authorization (EUA). This EUA will remain in effect (meaning this test can be used) for the duration of the COVID-19 declaration under Section 564(b)(1) of the Act, 21 U.S.C. section 360bbb-3(b)(1), unless the authorization is terminated or revoked.  Performed at [facility], [place] [address], [place], [place] [address]   Expectorated Sputum Assessment w Gram Stain, Rflx to Resp Cult     Status: None   Collection Time: 10/24/21  5:21 AM   Specimen: Expectorated Sputum  Result Value Ref Range Status   Specimen Description EXPECTORATED SPUTUM  Final   Special Requests NONE  Final   Sputum evaluation   Final    THIS SPECIMEN IS ACCEPTABLE FOR SPUTUM CULTURE Performed at [facility], [place] [address] [place], [place] [address]    Report Status 10/24/2021 FINAL   Final  Culture, Respiratory w Gram Stain     Status: None   Collection Time: 10/24/21  5:21 AM  Result Value Ref Range Status   Specimen Description   Final    EXPECTORATED SPUTUM Performed at [facility], [place] [address] [place], [place] [address]    Special Requests   Final    NONE Reflexed from [id number] Performed at [facility], [place] [address] [place], [place] [address]    Gram Stain   Final    ABUNDANT WBC PRESENT, PREDOMINANTLY PMN FEW GRAM POSITIVE COCCI IN PAIRS IN CHAINS Performed at [facility], [place] [address], [place], [place] [address]    Culture FEW STAPHYLOCOCCUS AUREUS  Final   Report Status 10/27/2021 FINAL  Final   Organism ID, Bacteria STAPHYLOCOCCUS AUREUS  Final      Susceptibility   Staphylococcus aureus - MIC*    CIPROFLOXACIN <=0.5 SENSITIVE Sensitive     ERYTHROMYCIN >=8 RESISTANT Resistant     GENTAMICIN <=0.5 SENSITIVE Sensitive     OXACILLIN 1 SENSITIVE Sensitive     TETRACYCLINE <=1 SENSITIVE Sensitive     VANCOMYCIN <=0.5 SENSITIVE Sensitive     TRIMETH/SULFA <=10 SENSITIVE Sensitive     CLINDAMYCIN <=0.25 SENSITIVE Sensitive     RIFAMPIN <=0.5 SENSITIVE Sensitive     Inducible Clindamycin NEGATIVE Sensitive     * FEW STAPHYLOCOCCUS AUREUS  MRSA Next Gen by PCR, Nasal     Status: None   Collection Time: 10/24/21 11:30 AM   Specimen: Nasal Mucosa; Nasal Swab  Result Value Ref Range Status   MRSA by PCR Next Gen NOT DETECTED NOT DETECTED Final    Comment: (NOTE) The GeneXpert MRSA Assay (FDA approved for NASAL specimens only), is one component of a comprehensive MRSA colonization surveillance program. It is not intended to diagnose MRSA infection nor to guide or monitor treatment for MRSA infections. Test performance is not FDA approved in patients less than 14 years old. Performed at [facility], [place] [address] [place],  [address]   Body fluid culture w Gram Stain      Status: None (Preliminary result)   Collection Time: 10/24/21 12:06 PM   Specimen: Lung, Right; Pleural Fluid  Result Value Ref Range Status   Specimen Description   Final    PLEURAL RIGHT Performed at [place] [address] [place],  [address]    Special Requests   Final    NONE Performed at [facility], [place] [address] [place],  [place] [address]    Gram Stain   Final    NO SQUAMOUS EPITHELIAL CELLS SEEN MODERATE WBC SEEN NO ORGANISMS SEEN CYTOSPIN SMEAR    Culture   Final    NO GROWTH 3 DAYS Performed at [facility], [address], [place], [place] [address]    Report Status PENDING  Incomplete          Radiology Studies: No results found.      Scheduled Meds:  albuterol  2.5 mg Nebulization BID   amiodarone  200 mg Oral Daily   vitamin C  500 mg Oral BID   busPIRone  10 mg Oral Daily   Chlorhexidine Gluconate Cloth  6 each Topical Daily   clopidogrel  75 mg Oral Daily   doxycycline  100 mg Oral Q12H   feeding supplement  237 mL Oral TID BM   mouth rinse  15 mL Mouth Rinse BID   midodrine  10 mg Oral TID WC   mirtazapine  15 mg Oral QHS   mometasone-formoterol  1 puff Inhalation BID   multivitamin with minerals  1 tablet Oral Daily   pantoprazole  40 mg Oral QODAY   Rivaroxaban  15 mg Oral Q supper   sodium chloride flush  3 mL Intravenous Q12H   sodium hypochlorite   Topical BID   umeclidinium bromide  1 puff Inhalation Daily   Continuous Infusions:  sodium chloride 10 mL/hr at 10/26/21 2234   ceFEPime (MAXIPIME) IV 2 g (10/27/21 1204)     LOS: 4 days   Georgette Shell, MD 10/27/2021, 12:56 PM

## 2021-10-27 NOTE — TOC Progression Note (Signed)
Transition of Care [facility]) - Progression Note    Patient Details  Name: Kristen [name] MRN: [id number] Date of Birth: 09-10-1946  Transition of Care [facility]) CM/SW Contact  Haislee Corso, Juliann Pulse, RN Phone Number: 10/27/2021, 3:08 PM  Clinical Narrative: [place] initiated #[id number] Josem Kaufmann for [address]      Expected Discharge Plan: Skilled Nursing Facility Barriers to Discharge: Insurance Authorization  Expected Discharge Plan and Services Expected Discharge Plan: [place]       Living arrangements for the past 2 months: [place]                                       Social Determinants of Health (SDOH) Interventions    Readmission Risk Interventions Readmission Risk Prevention Plan 06/17/2021 06/02/2021 09/29/2020  Transportation Screening Complete Complete Complete  PCP or Specialist Appt within 5-7 Days - - Complete  Home Care Screening - - Complete  Medication Review (RN CM) - - Complete  Medication Review ([place]) - Complete -  PCP or Specialist appointment within 3-5 days of discharge - Complete -  [place] or Home Care Consult - Complete -  SW Recovery Care/Counseling Consult Complete Complete -  Palliative Care Screening Complete Complete -  Skilled Nursing Facility Complete Complete -  Some recent data might be hidden

## 2021-10-27 NOTE — Evaluation (Signed)
Physical Therapy Evaluation Patient Details Name: Kristen [name] MRN: [id number] DOB: 08/31/1946 Today's Date: 10/27/2021  History of Present Illness  Kristen [name] is a very pleasant 75 y.o. female with medical history significant for COPD, chronic respiratory failure, HFpEF, PAF on Xarelto, chronic hyponatremia, chronic anemia, and recent admission for acute on chronic respiratory failure secondary to pneumonia, now presenting 10/23/21 from her SNF for evaluation of fever and hypoxia.  Clinical Impression  The patient is cooperative in  working on mobility  and exercises. Patient  assisted to sitting onto bed edge x 10 minutes.Patient able to scoot self  along bed edge while in seated position prior to returning to supine.  Patient performed UE and LE exercises while sitting.  SPO2 on 3 L 100%.  4/4 dyspnea with activity with SPO2 remaining at  100%. Patient will benefit from  continued PT to improve strength and return to functional level to decrease burden of care/  Recommend SNF  for further rehab. Pt admitted with above diagnosis.  Pt currently with functional limitations due to the deficits listed below (see PT Problem List). Pt will benefit from skilled PT to increase their independence and safety with mobility to allow discharge to the venue listed below.        Recommendations for follow up therapy are one component of a multi-disciplinary discharge planning process, led by the attending physician.  Recommendations may be updated based on patient status, additional functional criteria and insurance authorization.  Follow Up Recommendations Skilled nursing-short term rehab (<3 hours/day)    Assistance Recommended at Discharge Frequent or constant Supervision/Assistance  Functional Status Assessment Patient has had a recent decline in their functional status and/or demonstrates limited ability to make significant improvements in function in a reasonable and predictable amount of time   Equipment Recommendations  None recommended by PT    Recommendations for Other Services       Precautions / Restrictions Precautions Precautions: Fall Precaution Comments: Monitor SpO2      Mobility  Bed Mobility   Bed Mobility: Supine to Sit;Sit to Supine     Supine to sit: Mod assist     General bed mobility comments: assist  to pull up to sitting, Able to return to supine. Ablae to scoot self along bedd while seated to [facility]    Transfers                   General transfer comment: patient declined, noted 4/4 dyspnea with bed mobility    Ambulation/Gait                  Stairs            Wheelchair Mobility    Modified Rankin (Stroke Patients Only)       Balance   Sitting-balance support: No upper extremity supported;Feet supported Sitting balance-Leahy Scale: Fair                                       Pertinent Vitals/Pain Faces Pain Scale: Hurts little more Pain Location: tail bone Pain Descriptors / Indicators: Discomfort Pain Intervention(s): Monitored during session    Home Living Family/patient expects to be discharged to:: Skilled nursing facility                   Additional Comments: [facility] for [facility] re[facility]    Prior Function  Mobility Comments: reports non ambulatory at Facility, ADLs Comments: staff assisting     Hand Dominance        Extremity/Trunk Assessment   Upper Extremity Assessment Upper Extremity Assessment: Generalized weakness    Lower Extremity Assessment Lower Extremity Assessment: Generalized weakness    Cervical / Trunk Assessment Cervical / Trunk Assessment: Kyphotic  Communication      Cognition Arousal/Alertness: Awake/alert Behavior During Therapy: WFL for tasks assessed/performed Overall Cognitive Status: Within Functional Limits for tasks assessed                                          General  Comments      Exercises General Exercises - Upper Extremity Shoulder Flexion: AAROM;Both;5 reps;Seated Shoulder ABduction: AAROM;Both;5 reps;Seated General Exercises - Lower Extremity Long Arc Quad: AROM;Both;5 reps;Seated Hip Flexion/Marching: AROM;Both;5 reps;Seated   Assessment/Plan    PT Assessment Patient needs continued PT services  PT Problem List Decreased strength;Decreased mobility;Decreased activity tolerance;Decreased knowledge of use of DME;Cardiopulmonary status limiting activity;Decreased skin integrity;Pain       PT Treatment Interventions DME instruction;Therapeutic activities;Gait training;Therapeutic exercise;Patient/family education;Functional mobility training    PT Goals (Current goals can be found in the Care Plan section)  Acute Rehab PT Goals Patient Stated Goal: to get stronger PT Goal Formulation: With patient Time For Goal Achievement: 11/10/21 Potential to Achieve Goals: Fair    Frequency Min 2X/week   Barriers to discharge        Co-evaluation               AM-PAC PT "6 Clicks" Mobility  Outcome Measure Help needed turning from your back to your side while in a flat bed without using bedrails?: A Little Help needed moving from lying on your back to sitting on the side of a flat bed without using bedrails?: A Little Help needed moving to and from a bed to a chair (including a wheelchair)?: Total Help needed standing up from a chair using your arms (e.g., wheelchair or bedside chair)?: Total Help needed to walk in hospital room?: Total Help needed climbing 3-5 steps with a railing? : Total 6 Click Score: 10    End of Session   Activity Tolerance: Patient tolerated treatment well Patient left: in bed;with call bell/phone within reach;with bed alarm set Nurse Communication: Mobility status      Time: 1761-6073 PT Time Calculation (min) (ACUTE ONLY): 24 min   Charges:   PT Evaluation $PT Eval Low Complexity: 1 Low PT  Treatments $Therapeutic Activity: 8-22 mins        [place] Pager [phone] Office [phone]   Claretha Cooper 10/27/2021, 2:32 PM

## 2021-10-27 NOTE — Plan of Care (Signed)
Kristen [name] has maintained her O2 sat >92% on 3lnc. She is taking her home dose of percocet to manage her chronic back pain. Her SBP has been >100 on midodrine. She continues to have a congested cough productive of clear/tan sputum. She is anticipating d/c back to [facility].  Problem: Education: Goal: Knowledge of General Education information will improve Description: Including pain rating scale, medication(s)/side effects and non-pharmacologic comfort measures Outcome: Progressing   Problem: Health Behavior/Discharge Planning: Goal: Ability to manage health-related needs will improve Outcome: Progressing   Problem: Clinical Measurements: Goal: Ability to maintain clinical measurements within normal limits will improve Outcome: Progressing Goal: Will remain free from infection Outcome: Progressing Note: IV abx Goal: Diagnostic test results will improve Outcome: Progressing Goal: Respiratory complications will improve Outcome: Progressing Goal: Cardiovascular complication will be avoided Outcome: Progressing   Problem: Activity: Goal: Risk for activity intolerance will decrease Outcome: Progressing   Problem: Nutrition: Goal: Adequate nutrition will be maintained Outcome: Progressing   Problem: Pain Managment: Goal: General experience of comfort will improve Outcome: Progressing   Problem: Safety: Goal: Ability to remain free from injury will improve Outcome: Progressing   Problem: Skin Integrity: Goal: Risk for impaired skin integrity will decrease Outcome: Progressing   Problem: Education: Goal: Knowledge of disease or condition will improve Outcome: Progressing Goal: Knowledge of the prescribed therapeutic regimen will improve Outcome: Progressing Goal: Individualized Educational Video(s) Outcome: Progressing

## 2021-10-28 LAB — BODY FLUID CULTURE W GRAM STAIN
Culture: NO GROWTH
Gram Stain: NONE SEEN

## 2021-10-28 MED ORDER — DAKINS (1/4 STRENGTH) 0.125 % EX SOLN: Freq: Two times a day (BID) | CUTANEOUS | 0 refills | Status: AC

## 2021-10-28 MED ORDER — OXYCODONE-ACETAMINOPHEN 5-325 MG PO TABS: 1.0000 | ORAL_TABLET | ORAL | 0 refills | Status: DC | PRN

## 2021-10-28 MED ORDER — DOXYCYCLINE HYCLATE 100 MG PO TABS: 100.0000 mg | ORAL_TABLET | Freq: Two times a day (BID) | ORAL | 0 refills | Status: DC

## 2021-10-28 MED ORDER — AMOXICILLIN-POT CLAVULANATE 875-125 MG PO TABS: 1.0000 | ORAL_TABLET | Freq: Two times a day (BID) | ORAL | 0 refills | Status: AC

## 2021-10-28 NOTE — Care Management Important Message (Signed)
Important Message  Patient Details IM Letter placed in Patients room. Name: Kristen [name] MRN: [id number] Date of Birth: 21-Apr-1946   Medicare Important Message Given:  Yes     Kerin Salen 10/28/2021, 12:20 PM

## 2021-10-28 NOTE — TOC Transition Note (Signed)
Transition of Care [facility]) - CM/SW Discharge Note   Patient Details  Name: Kristen [name] MRN: [id number] Date of Birth: February 02, 1946  [facility] CM/SW Contact:  Dessa Phi, RN Phone Number: 10/28/2021, 1:35 PM   Clinical Narrative: d/c today back to [place] [id number] call report tel#[phone] .PTAR called. No further CM needs.     Final next level of care: [place] Barriers to Discharge: No Barriers Identified   Patient Goals and CMS Choice Patient states their goals for this hospitalization and ongoing recovery are:: return back to [facility] CMS Medicare.gov Compare Post Acute Care list provided to:: Patient Choice offered to / list presented to : Patient  Discharge Placement                  Name of family member notified: [name] Patient and family notified of of transfer: 10/28/21  Discharge Plan and Services                                     Social Determinants of Health ([place]) Interventions     Readmission Risk Interventions Readmission Risk Prevention Plan 06/17/2021 06/02/2021 09/29/2020  Transportation Screening Complete Complete Complete  PCP or Specialist Appt within 5-7 Days - - Complete  Home Care Screening - - Complete  Medication Review (RN CM) - - Complete  Medication Review [employer]) - Complete -  PCP or Specialist appointment within 3-5 days of discharge - Complete -  [place] or Home Care Consult - Complete -  SW Recovery Care/Counseling Consult Complete Complete -  Palliative Care Screening Complete Complete -  Skilled Nursing Facility Complete Complete -  Some recent data might be hidden

## 2021-10-28 NOTE — Discharge Summary (Signed)
Physician Discharge Summary  Kristen [name] VQQ:[id number] DOB: [birth year] DOA: 10/23/2021  PCP: [name], MD  Admit date: 10/23/2021 Discharge date: 10/28/2021  Admitted From:  Disposition:    Recommendations for Outpatient Follow-up:  Follow up with PCP in 1-2 weeks Please obtain BMP/CBC in one week Please note that I have stopped her metoprolol due to her low blood pressure please restart this as needed as an outpatient.  Continue amiodarone.  Home Health: None Equipment/Devices: None  Discharge Condition: Stable CODE STATUS: DNR Diet recommendation: Regular Brief/Interim Summary:Kristen [name] is a very pleasant 75 y.o. female with medical history significant for COPD, chronic respiratory failure, HFpEF, PAF on Xarelto, chronic hyponatremia, chronic anemia, and recent admission for acute on chronic respiratory failure secondary to pneumonia, now presenting from her SNF for evaluation of fever and hypoxia. Patient denies increased SOB, increased cough, or chills but was noted to be febrile and hypoxic at her SNF and EMS was called for transport to the ED. She denies chest pain, leg swelling, melena, or hematochezia.    ED Course: Upon arrival to the ED, patient is found to be afebrile, requiring 8 L/min of supplemental oxygen to maintain saturations in the low 90s, and with blood pressure 99/61.  EKG features sinus rhythm with first-degree AV nodal block.  Chest x-ray with increasing and large right pleural effusion with increased right lower lobe airspace disease.  Chemistry panel features a sodium of 125.  CBC with leukocytosis to 13,700 and hemoglobin 8.8.  CTA chest is negative for PE but re-demonstrates advanced emphysema and the large right pleural effusion and reveals distal right main bronchus occlusion, likely from mucous plugging but with endobronchial neoplasm not excluded.  Patient was given Rocephin and azithromycin in the ED   Discharge Diagnoses:  Principal Problem:    Acute on chronic respiratory failure with hypoxia (HCC) Active Problems:   COPD (chronic obstructive pulmonary disease) (HCC)   Hypoxia   Hyponatremia   Generalized anxiety disorder   AF (paroxysmal atrial fibrillation) (HCC)   Pleural effusion on right  #1  Acute on chronic hypoxic respiratory failure with large right-sided pleural effusion/HCAP/COPD end-stage O2 dependent at 3 L 24/7.  Patient admitted with fever shortness of breath cough and hypoxia.  She was admitted to the hospital last month with similar complaints and was treated for pneumonia with Rocephin and had thoracentesis done and was discharged back to the SNF.   CT chest this admission no PE but advanced emphysema and large right pleural effusion and reveals distal right main bronchus occlusion likely from mucous plugging but with endobronchial neoplasm cannot be excluded. She had right thoracentesis 1.5 L of hazy fluid removed.  On the day of discharge there is no growth.  Pleural fluid studies consistent with exudate.  She was treated with Vanco and cefepime.  And discharged on Augmentin for 2 more days. COVID test prior to discharge was negative.  Flu negative.   #2 persistent atrial fibrillation on amiodarone Xarelto  EF 50% from echo done on October 2022.  I have stopped metoprolol due to low blood pressure.   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   #5 depression continue home meds BuSpar Remeron and trazodone   #6 goals of care -patient is DNR with multiple comorbidities and very poor functional status nursing home resident wheelchair-bound/bedbound she does not want to undergo bronchoscopy or any invasive measures.   #7 hyponatremia sodium 132  on the day of discharge.  This is improved from 125 on admission.    #8 chronic hypotension on midodrine   #9 anemia she received a unit of blood transfusion last month monitor closely on  Xarelto   #10 multiple pressure injuries present on admission wound care consulted. Patient has unstageable sacral and coccyx wounds on admission.  Due to national unavailability of Santyl order for Dakin's moistened gauze over the wounds only not the intact skin on the sacrum and coccyx was ordered for 8 days.   Pressure Injury 10/26/21 Sacrum Medial Unstageable - Full thickness tissue loss in which the base of the injury is covered by slough (yellow, tan, gray, green or brown) and/or eschar (tan, brown or black) in the wound bed. previously classified as DTI, r (Active)  10/26/21 1330  Location: Sacrum  Location Orientation: Medial  Staging: Unstageable - Full thickness tissue loss in which the base of the injury is covered by slough (yellow, tan, gray, green or brown) and/or eschar (tan, brown or black) in the wound bed.  Wound Description (Comments): previously classified as DTI, reclassified as unstageable per WOC.  Present on Admission: Yes     Pressure Injury 10/26/21 Coccyx Medial Unstageable - Full thickness tissue loss in which the base of the injury is covered by slough (yellow, tan, gray, green or brown) and/or eschar (tan, brown or black) in the wound bed. previously classified as stage  (Active)  10/26/21 1330  Location: Coccyx  Location Orientation: Medial  Staging: Unstageable - Full thickness tissue loss in which the base of the injury is covered by slough (yellow, tan, gray, green or brown) and/or eschar (tan, brown or black) in the wound bed.  Wound Description (Comments): previously classified as stage II on admission, reclassified as unstageable per WOC  Present on Admission: Yes      Nutrition Problem: Severe Malnutrition Etiology: chronic illness (COPD)    Signs/Symptoms: severe fat depletion, severe muscle depletion        Estimated body mass index is 12.62 kg/m as calculated from the following:   Height as of this encounter: _0  (1.778 m).   Weight  as of this encounter: 39.9 kg.  Discharge Instructions  Discharge Instructions     Diet - low sodium heart healthy   Complete by: As directed    Discharge wound care:   Complete by: As directed    See dc summary   Increase activity slowly   Complete by: As directed       Allergies as of 10/28/2021       Reactions   Gabapentin Other (See Comments)   Hallucination/ nightmares- "Allergic," per [facility]        Medication List     STOP taking these medications    metoprolol succinate 25 MG 24 hr tablet Commonly known as: TOPROL-XL   ondansetron 4 MG tablet Commonly known as: ZOFRAN   oxyCODONE 5 MG immediate release tablet Commonly known as: Oxy IR/ROXICODONE   oxyCODONE-acetaminophen 10-325 MG tablet Commonly known as: Percocet Replaced by: oxyCODONE-acetaminophen 5-325 MG tablet       TAKE these medications    acetaminophen 325 MG tablet Commonly known as: TYLENOL Take 325 mg by mouth every 6 (six) hours as needed for moderate pain.   albuterol 108 (90 Base) MCG/ACT inhaler Commonly known as: VENTOLIN HFA INHALE 1-2 PUFFS BY MOUTH EVERY 6 HOURS AS NEEDED FOR WHEEZE OR SHORTNESS OF BREATH What changed: See the new instructions.   amiodarone 200  MG tablet Commonly known as: PACERONE TAKE 1 TABLET BY MOUTH EVERY DAY   amoxicillin-clavulanate 875-125 MG tablet Commonly known as: Augmentin Take 1 tablet by mouth 2 (two) times daily for 2 days.   bisacodyl 10 MG suppository Commonly known as: DULCOLAX Place 10 mg rectally daily as needed (for constipation).   busPIRone 10 MG tablet Commonly known as: BUSPAR Take 1 tablet (10 mg total) by mouth daily.   clopidogrel 75 MG tablet Commonly known as: PLAVIX Take 1 tablet (75 mg total) by mouth daily.   docusate sodium 100 MG capsule Commonly known as: COLACE Take 1 capsule (100 mg total) by mouth 2 (two) times daily as needed for mild constipation. What changed: when to take this   Dulera 200-5 MCG/ACT  Aero Generic drug: mometasone-formoterol Inhale 1 puff into the lungs in the morning and at bedtime.   feeding supplement Liqd Take 237 mLs by mouth 2 (two) times daily between meals.   ferrous sulfate 324 MG Tbec Take 324 mg by mouth 2 (two) times daily with a meal.   fluticasone 50 MCG/ACT nasal spray Commonly known as: FLONASE Place 1 spray into both nostrils daily as needed for allergies or rhinitis.   Incruse Ellipta 62.5 MCG/ACT Aepb Generic drug: umeclidinium bromide INHALE 1 PUFF BY MOUTH EVERY DAY What changed:  how much to take how to take this when to take this additional instructions   ipratropium-albuterol 0.5-2.5 (3) MG/3ML Soln Commonly known as: DUONEB TAKE 3 MLS BY NEBULIZATION EVERY 6 (SIX) HOURS AS NEEDED (SHORTNESS OF BREATH/WHEEZING.). What changed: reasons to take this   ketoconazole 2 % cream Commonly known as: NIZORAL Apply 1 application topically See admin instructions. Apply to rash on left hand once a day (1st digit)   magnesium oxide 400 MG tablet Commonly known as: MAG-OX Take 400 mg by mouth daily.   midodrine 5 MG tablet Commonly known as: PROAMATINE Take 1 tablet (5 mg total) by mouth 3 (three) times daily with meals.   mirtazapine 15 MG tablet Commonly known as: REMERON Take 1 tablet (15 mg total) by mouth at bedtime as needed (sleep). What changed: when to take this   Multiple Vitamin-Folic Acid Tabs Take 1 tablet by mouth daily with breakfast.   oxyCODONE-acetaminophen 5-325 MG tablet Commonly known as: PERCOCET/ROXICET Take 1 tablet by mouth every 4 (four) hours as needed for severe pain. Replaces: oxyCODONE-acetaminophen 10-325 MG tablet   pantoprazole 40 MG tablet Commonly known as: PROTONIX Take 1 tablet (40 mg total) by mouth daily at 6 (six) AM. What changed: when to take this   polyethylene glycol 17 g packet Commonly known as: MIRALAX / GLYCOLAX Take 17 g by mouth daily as needed for moderate constipation. What  changed:  when to take this additional instructions   Pro-Stat AWC Liqd Take 30 mLs by mouth 2 (two) times daily.   sodium hypochlorite 0.125 % Soln Commonly known as: DAKIN'S 1/4 STRENGTH Apply topically 2 (two) times daily for 9 days.   traZODone 50 MG tablet Commonly known as: DESYREL Take 25 mg by mouth at bedtime as needed for sleep.   vitamin C 500 MG tablet Commonly known as: ASCORBIC ACID Take 500 mg by mouth daily.   Xarelto 15 MG Tabs tablet Generic drug: Rivaroxaban TAKE 1 TABLET (15 MG TOTAL) BY MOUTH DAILY.               Discharge Care Instructions  (From admission, onward)  Start     Ordered   10/28/21 0000  Discharge wound care:       Comments: See dc summary   10/28/21 0909            Allergies  Allergen Reactions   Gabapentin Other (See Comments)    Hallucination/ nightmares- "Allergic," per [facility]    Consultations: None   Procedures/Studies: DG Chest 1 View  Result Date: 10/24/2021 CLINICAL DATA:  Status post thoracentesis EXAM: CHEST  1 VIEW COMPARISON:  10/23/2021 FINDINGS: Status post right thoracentesis with diminished volume of a right pleural effusion and significant improvement in aeration of the lower right lung. There remains a large, dense, masslike consolidation of the right upper lobe. Emphysema of the left lung. Heart and mediastinum are unremarkable. IMPRESSION: 1. Status post right thoracentesis with diminished volume of a right pleural effusion and significant improvement in aeration of the lower right lung. There remains a large, dense, masslike consolidation of the right upper lobe. 2. No pneumothorax. 3. Emphysema. Electronically Signed   By: Delanna Ahmadi M.D.   On: 10/24/2021 12:02   DG Chest 1 View  Result Date: 10/03/2021 CLINICAL DATA:  Status post thoracentesis. EXAM: CHEST  1 VIEW COMPARISON:  10/02/2021 FINDINGS: There continues to be marked opacification throughout the right hemithorax. Small areas  of aeration noted in the right lower chest and lateral right upper chest. These areas are compatible with air bronchograms. Heart and mediastinum are grossly stable. Left lung is aerated without acute abnormality. IMPRESSION: 1. Persistent opacification throughout the right hemithorax compatible with chronic consolidation and suspect a trapped lung. 2. Slightly decreased opacification in the right lower chest is compatible with recent thoracentesis. Difficult to exclude some pleural air but no evidence for a large pneumothorax. Electronically Signed   By: Markus Daft M.D.   On: 10/03/2021 09:28   CT Chest W Contrast  Result Date: 10/01/2021 CLINICAL DATA:  Pneumonia, effusion or abscess suspected EXAM: CT CHEST WITH CONTRAST TECHNIQUE: Multidetector CT imaging of the chest was performed during intravenous contrast administration. CONTRAST:  62m OMNIPAQUE IOHEXOL 300 MG/ML  SOLN COMPARISON:  CT chest 02/22/2021 FINDINGS: Cardiovascular: Normal heart size. No significant pericardial effusion. The thoracic aorta is normal in caliber. Mild atherosclerotic plaque of the thoracic aorta. At least 2 vessel coronary artery calcifications. No central or segmental pulmonary embolus. Mild left to right mediastinal shift. Mediastinum/Nodes: Enlarged precarinal lymph node measuring 1.3 cm. Enlarged subcarinal lymph node measuring 1.2 cm. Enlarged right paratracheal lymph node measuring 1.2 cm. No enlarged left hilar or axillary lymph nodes. Thyroid gland, trachea, and esophagus demonstrate no significant findings. Lungs/Pleura: Heterogeneous right upper lobe masslike consolidation involving the entire right upper lobe. Passive atelectasis of the right middle and lower lobe with partial collapse. Interval development of a moderate volume right pleural effusion. Nonvisualization of the right bronchials likely due to obstruction or collapse. Severe emphysematous changes of the aerated left lung. Trace left pleural effusion.  Passive atelectasis of the left lower lobe. Upper Abdomen: No acute abnormality. Musculoskeletal: No chest wall abnormality. No suspicious lytic or blastic osseous lesions. No acute displaced fracture. IMPRESSION: 1. Complete opacification of the right upper lobe with a heterogeneous masslike consolidation. Interval development of a moderate volume right pleural effusion. 2. Mediastinal lymphadenopathy. 3. Trace left pleural effusion. 4. Aortic Atherosclerosis (ICD10-I70.0) and Emphysema (ICD10-J43.9). Electronically Signed   By: MIven FinnM.D.   On: 10/01/2021 16:49   CT Angio Chest PE W and/or Wo Contrast  Result Date: 10/23/2021  CLINICAL DATA:  Dyspnea and hypoxia. Current chest radiograph shows significant right lung airspace disease. EXAM: CT ANGIOGRAPHY CHEST WITH CONTRAST TECHNIQUE: Multidetector CT imaging of the chest was performed using the standard protocol during bolus administration of intravenous contrast. Multiplanar CT image reconstructions and MIPs were obtained to evaluate the vascular anatomy. CONTRAST:  47m OMNIPAQUE IOHEXOL 350 MG/ML SOLN COMPARISON:  10/01/2021, CT chest.  Current chest radiograph. FINDINGS: Cardiovascular: Pulmonary arteries are well opacified. There is no evidence of a pulmonary embolism. Heart is normal in size. No pericardial effusion. Mild left coronary artery calcifications. Great vessels are normal in caliber. No aortic dissection. Mild aortic atherosclerosis. Is Mediastinum/Nodes: No neck base, mediastinal or hilar masses for enlarged lymph nodes. Trachea is unremarkable. Esophagus not well-defined, but grossly unremarkable. Lungs/Pleura: Occluded distal right main bronchus likely from secretions/mucus plugging. Most of the right lung is collapsed. Small portions of the right middle and right upper lobe are aerated. Large right pleural effusion. Left lung shows advanced emphysema. Mild dependent opacity in the left lower lobe and base of the left upper lobe  lingula consistent with atelectasis. Trace left pleural effusion. Upper Abdomen: No acute abnormality. Musculoskeletal: No fracture or acute finding.  No bone lesion. Review of the MIP images confirms the above findings. IMPRESSION: 1. No evidence of a pulmonary embolism. 2. Large right pleural effusion with near complete atelectasis/collapse the right lung. 3. Distal right main bronchus is occluded. This is most likely due to mucous plugging. Endobronchial neoplasm is not excluded, however. Follow-up bronchoscopy is recommended. 4. Trace left pleural effusion with mild dependent left lower lobe and left upper lobe lingula atelectasis. 5. No evidence of pulmonary edema. 6. Advanced emphysema. Aortic Atherosclerosis (ICD10-I70.0) and Emphysema (ICD10-J43.9). Electronically Signed   By: DLajean ManesM.D.   On: 10/23/2021 20:18   DG Chest Port 1 View  Result Date: 10/23/2021 CLINICAL DATA:  Questionable pneumonia. EXAM: PORTABLE CHEST 1 VIEW COMPARISON:  Chest x-ray 10/03/2021.  CT of the chest 10/01/2021. FINDINGS: There is complete opacification of the right hemithorax which appears similar to the prior examination. Right pleural effusion has likely mildly increased in there is increased opacification in the aerated right lower lung. Left lung is clear. There is no pneumothorax. Cardiomediastinal silhouette is within normal limits and stable. No acute fractures. IMPRESSION: 1. Increasing large right pleural effusion. 2. Increasing right lower lung airspace disease. Electronically Signed   By: ARonney AstersM.D.   On: 10/23/2021 19:38   DG CHEST PORT 1 VIEW  Result Date: 10/03/2021 CLINICAL DATA:  Right thoracentesis cough EXAM: PORTABLE CHEST 1 VIEW COMPARISON:  Previous studies including the examination of 10/03/2021 FINDINGS: There is improvement in aeration of right lower lung fields following thoracentesis still, there is almost complete opacification of right upper lung fields and partial opacification  of right lower lung fields. Left lung remains clear. There is minimal blunting of left lateral CP angle. There is no definite pneumothorax. There is small faint focus of lucency in the lateral aspect of right apex which was also evident in the previous study. IMPRESSION: There is interval decrease in amount of right pleural effusion after thoracentesis. There is no demonstrable pneumothorax. Still, right hemithorax is mostly opacified suggesting presence of large residual pleural effusion and possibly underlying atelectasis/pneumonia. Small left pleural effusion is present. Electronically Signed   By: PElmer PickerM.D.   On: 10/03/2021 13:35   DG CHEST PORT 1 VIEW  Result Date: 10/02/2021 CLINICAL DATA:  Shortness of breath EXAM: PORTABLE  CHEST 1 VIEW COMPARISON:  10/01/2021 05/27/2021 FINDINGS: Completely opacified right thorax with worsened aeration at right base. Obscured cardiomediastinal silhouette. Possible small left effusion. IMPRESSION: 1. Completely opacified right thorax consistent with pleural effusion and underlying airspace disease and or mass, aeration is worsened compared to yesterday's radiograph 2. Possible small left effusion with mild airspace disease at left base Electronically Signed   By: Donavan Foil M.D.   On: 10/02/2021 19:21   DG Chest Port 1 View  Result Date: 10/01/2021 CLINICAL DATA:  Sepsis. EXAM: PORTABLE CHEST 1 VIEW COMPARISON:  September 05, 2021. FINDINGS: Stable cardiomediastinal silhouette. Significant increased right upper lobe opacity is noted concerning for worsening pneumonia. Left lung is clear. Right basilar atelectasis and effusion may be present. Bony thorax is unremarkable. IMPRESSION: Significantly increased right upper lobe consolidation is noted concerning for worsening pneumonia, although underlying mass cannot be excluded. CT scan is recommended for further evaluation. Electronically Signed   By: Marijo Conception M.D.   On: 10/01/2021 14:06   IR  THORACENTESIS ASP PLEURAL SPACE W/IMG GUIDE  Result Date: 10/03/2021 INDICATION: History of COPD with right-sided pleural effusion. Request for diagnostic and therapeutic thoracentesis. EXAM: ULTRASOUND GUIDED DIAGNOSTIC AND THERAPEUTIC THORACENTESIS MEDICATIONS: 3m 1% lidocaine COMPLICATIONS: None immediate. PROCEDURE: An ultrasound guided thoracentesis was thoroughly discussed with the patient and questions answered. The benefits, risks, alternatives and complications were also discussed. The patient understands and wishes to proceed with the procedure. Written consent was obtained. Ultrasound was performed to localize and mark an adequate pocket of fluid in the right chest. The area was then prepped and draped in the normal sterile fashion. 1% Lidocaine was used for local anesthesia. Under ultrasound guidance a 6 Fr Safe-T-Centesis catheter was introduced. Thoracentesis was performed. The catheter was removed and a dressing applied. FINDINGS: A total of approximately 900 ml of hazy, amber fluid was removed. Samples were sent to the laboratory as requested by the clinical team. IMPRESSION: Successful ultrasound guided right thoracentesis yielding 900 mL of pleural fluid. Read by: SNarda Rutherford NP Electronically Signed   By: AMarkus DaftM.D.   On: 10/03/2021 09:29   [place]ORACENTESIS ASP PLEURAL SPACE W/IMG GUIDE  Result Date: 10/24/2021 INDICATION: Patient with history of COPD, chronic respiratory failure, AFib, pneumonia and hypoxia. Request for IR to perform diagnostic and therapeutic thoracentesis for large right pleural effusion. EXAM: ULTRASOUND GUIDED DIAGNOSTIC AND THERAPEUTIC THORACENTESIS MEDICATIONS: 122m1% lidocaine COMPLICATIONS: None immediate. PROCEDURE: An ultrasound guided thoracentesis was thoroughly discussed with the patient and questions answered. The benefits, risks, alternatives and complications were also discussed. The patient understands and wishes to proceed with the procedure.  Written consent was obtained. Ultrasound was performed to localize and mark an adequate pocket of fluid in the right chest. The area was then prepped and draped in the normal sterile fashion. 1% Lidocaine was used for local anesthesia. Under ultrasound guidance a 6 Fr Safe-T-Centesis catheter was introduced. Thoracentesis was performed. The catheter was removed and a dressing applied. FINDINGS: A total of approximately 1.3 L of hazy, yellow fluid was removed. Samples were sent to the laboratory as requested by the clinical team. IMPRESSION: Successful ultrasound guided right thoracentesis yielding 1.3 L of pleural fluid. Read by: SuNarda RutherfordNP Electronically Signed   By: FaMiachel Roux.D.   On: 10/24/2021 12:03   (Echo, Carotid, EGD, Colonoscopy, ERCP)    Subjective: Patient is resting in bed eating breakfast awake and alert very pleasant young lady  Discharge Exam: Vitals:   10/28/21  0414 10/28/21 0854  BP: 112/64   Pulse: 66   Resp: 18   Temp: 98.3 F (36.8 C)   SpO2: 100% 99%   Vitals:   10/27/21 1955 10/27/21 2054 10/28/21 0414 10/28/21 0854  BP:  (!) 92/58 112/64   Pulse:  72 66   Resp:  18 18   Temp:  98.1 F (36.7 C) 98.3 F (36.8 C)   TempSrc:  Oral Oral   SpO2: 99% 98% 100% 99%  Weight:      Height:        General: Pt is alert, awake, not in acute distress frail elderly cachectic chronically ill looking Cardiovascular: RRR, S1/S2 +, no rubs, no gallops Respiratory: Rhonchi bilaterally, no wheezing, no rhonchi Abdominal: Soft, NT, ND, bowel sounds + Extremities: no edema, no cyanosis    The results of significant diagnostics from this hospitalization (including imaging, microbiology, ancillary and laboratory) are listed below for reference.     Microbiology: Recent Results (from the past 240 hour(s))  Resp Panel by RT-PCR (Flu A&B, Covid) Nasopharyngeal Swab     Status: None   Collection Time: 10/23/21  9:59 PM   Specimen: Nasopharyngeal Swab;  Nasopharyngeal(NP) swabs in vial transport medium  Result Value Ref Range Status   SARS Coronavirus 2 by RT PCR NEGATIVE NEGATIVE Final    Comment: (NOTE) SARS-CoV-2 target nucleic acids are NOT DETECTED.  The SARS-CoV-2 RNA is generally detectable in upper respiratory specimens during the acute phase of infection. The lowest concentration of SARS-CoV-2 viral copies this assay can detect is 138 copies/mL. A negative result does not preclude SARS-Cov-2 infection and should not be used as the sole basis for treatment or other patient management decisions. A negative result may occur with  improper specimen collection/handling, submission of specimen other than nasopharyngeal swab, presence of viral mutation(s) within the areas targeted by this assay, and inadequate number of viral copies(<138 copies/mL). A negative result must be combined with clinical observations, patient history, and epidemiological information. The expected result is Negative.  Fact Sheet for Patients:  EntrepreneurPulse.com.au  Fact Sheet for Healthcare Providers:  IncredibleEmployment.be  This test is no t yet approved or cleared by the [place] FDA and  has been authorized for detection and/or diagnosis of SARS-CoV-2 by FDA under an Emergency Use Authorization (EUA). This EUA will remain  in effect (meaning this test can be used) for the duration of the COVID-19 declaration under Section 564(b)(1) of the Act, 21 U.S.C.section 360bbb-3(b)(1), unless the authorization is terminated  or revoked sooner.       Influenza A by PCR NEGATIVE NEGATIVE Final   Influenza B by PCR NEGATIVE NEGATIVE Final    Comment: (NOTE) The Xpert Xpress SARS-CoV-2/FLU/RSV plus assay is intended as an aid in the diagnosis of influenza from Nasopharyngeal swab specimens and should not be used as a sole basis for treatment. Nasal washings and aspirates are unacceptable for Xpert Xpress  SARS-CoV-2/FLU/RSV testing.  Fact Sheet for Patients: EntrepreneurPulse.com.au  Fact Sheet for Healthcare Providers: IncredibleEmployment.be  This test is not yet approved or cleared by the [place] FDA and has been authorized for detection and/or diagnosis of SARS-CoV-2 by FDA under an Emergency Use Authorization (EUA). This EUA will remain in effect (meaning this test can be used) for the duration of the COVID-19 declaration under Section 564(b)(1) of the Act, 21 U.S.C. section 360bbb-3(b)(1), unless the authorization is terminated or revoked.  Performed at [facility], [place] [address], [place], [place] [address]  Expectorated Sputum Assessment w Gram Stain, Rflx to Resp Cult     Status: None   Collection Time: 10/24/21  5:21 AM   Specimen: Expectorated Sputum  Result Value Ref Range Status   Specimen Description EXPECTORATED SPUTUM  Final   Special Requests NONE  Final   Sputum evaluation   Final    THIS SPECIMEN IS ACCEPTABLE FOR SPUTUM CULTURE Performed at [facility], [place] [address] [place], [place] [address]    Report Status 10/24/2021 FINAL  Final  Culture, Respiratory w Gram Stain     Status: None   Collection Time: 10/24/21  5:21 AM  Result Value Ref Range Status   Specimen Description   Final    EXPECTORATED SPUTUM Performed at [facility], [place] [address] [place], [place] [address]    Special Requests   Final    NONE Reflexed from [id number] Performed at [facility], [place] [address] [place], [place] [address]    Gram Stain   Final    ABUNDANT WBC PRESENT, PREDOMINANTLY PMN FEW GRAM POSITIVE COCCI IN PAIRS IN CHAINS Performed at [facility], [place] [address] [place], [place] [address]    Culture FEW STAPHYLOCOCCUS AUREUS  Final   Report Status 10/27/2021 FINAL  Final   Organism ID, Bacteria STAPHYLOCOCCUS AUREUS  Final       Susceptibility   Staphylococcus aureus - MIC*    CIPROFLOXACIN <=0.5 SENSITIVE Sensitive     ERYTHROMYCIN >=8 RESISTANT Resistant     GENTAMICIN <=0.5 SENSITIVE Sensitive     OXACILLIN 1 SENSITIVE Sensitive     TETRACYCLINE <=1 SENSITIVE Sensitive     VANCOMYCIN <=0.5 SENSITIVE Sensitive     TRIMETH/SULFA <=10 SENSITIVE Sensitive     CLINDAMYCIN <=0.25 SENSITIVE Sensitive     RIFAMPIN <=0.5 SENSITIVE Sensitive     Inducible Clindamycin NEGATIVE Sensitive     * FEW STAPHYLOCOCCUS AUREUS  MRSA Next Gen by PCR, Nasal     Status: None   Collection Time: 10/24/21 11:30 AM   Specimen: Nasal Mucosa; Nasal Swab  Result Value Ref Range Status   MRSA by PCR Next Gen NOT DETECTED NOT DETECTED Final    Comment: (NOTE) The GeneXpert MRSA Assay (FDA approved for NASAL specimens only), is one component of a comprehensive MRSA colonization surveillance program. It is not intended to diagnose MRSA infection nor to guide or monitor treatment for MRSA infections. Test performance is not FDA approved in patients less than 50 years old. Performed at [facility], [place] [address] [place], [place] [address]   Body fluid culture w Gram Stain     Status: None (Preliminary result)   Collection Time: 10/24/21 12:06 PM   Specimen: Lung, Right; Pleural Fluid  Result Value Ref Range Status   Specimen Description   Final    PLEURAL RIGHT Performed at [place] [address], [place], [place] [address]    Special Requests   Final    NONE Performed at [facility], [place] [address], [place], [place] [address]    Gram Stain   Final    NO SQUAMOUS EPITHELIAL CELLS SEEN MODERATE WBC SEEN NO ORGANISMS SEEN CYTOSPIN SMEAR    Culture   Final    NO GROWTH 3 DAYS Performed at [facility], [address], [place], [place] [address]    Report Status PENDING  Incomplete  Resp Panel by RT-PCR (Flu A&B, Covid) Nasopharyngeal Swab  Status: None    Collection Time: 10/27/21 12:06 PM   Specimen: Nasopharyngeal Swab; Nasopharyngeal(NP) swabs in vial transport medium  Result Value Ref Range Status   SARS Coronavirus 2 by RT PCR NEGATIVE NEGATIVE Final    Comment: (NOTE) SARS-CoV-2 target nucleic acids are NOT DETECTED.  The SARS-CoV-2 RNA is generally detectable in upper respiratory specimens during the acute phase of infection. The lowest concentration of SARS-CoV-2 viral copies this assay can detect is 138 copies/mL. A negative result does not preclude SARS-Cov-2 infection and should not be used as the sole basis for treatment or other patient management decisions. A negative result may occur with  improper specimen collection/handling, submission of specimen other than nasopharyngeal swab, presence of viral mutation(s) within the areas targeted by this assay, and inadequate number of viral copies(<138 copies/mL). A negative result must be combined with clinical observations, patient history, and epidemiological information. The expected result is Negative.  Fact Sheet for Patients:  EntrepreneurPulse.com.au  Fact Sheet for Healthcare Providers:  IncredibleEmployment.be  This test is no t yet approved or cleared by the [place] FDA and  has been authorized for detection and/or diagnosis of SARS-CoV-2 by FDA under an Emergency Use Authorization (EUA). This EUA will remain  in effect (meaning this test can be used) for the duration of the COVID-19 declaration under Section 564(b)(1) of the Act, 21 U.S.C.section 360bbb-3(b)(1), unless the authorization is terminated  or revoked sooner.       Influenza A by PCR NEGATIVE NEGATIVE Final   Influenza B by PCR NEGATIVE NEGATIVE Final    Comment: (NOTE) The Xpert Xpress SARS-CoV-2/FLU/RSV plus assay is intended as an aid in the diagnosis of influenza from Nasopharyngeal swab specimens and should not be used as a sole basis for treatment.  Nasal washings and aspirates are unacceptable for Xpert Xpress SARS-CoV-2/FLU/RSV testing.  Fact Sheet for Patients: EntrepreneurPulse.com.au  Fact Sheet for Healthcare Providers: IncredibleEmployment.be  This test is not yet approved or cleared by the [place] FDA and has been authorized for detection and/or diagnosis of SARS-CoV-2 by FDA under an Emergency Use Authorization (EUA). This EUA will remain in effect (meaning this test can be used) for the duration of the COVID-19 declaration under Section 564(b)(1) of the Act, 21 U.S.C. section 360bbb-3(b)(1), unless the authorization is terminated or revoked.  Performed at [facility], [place] [address], [place], [place] [address]      Labs: BNP (last 3 results) Recent Labs    05/27/21 1220 09/04/21 0234 10/23/21 1754  BNP 526.6* 942.6* 338.3*   Basic Metabolic Panel: Recent Labs  Lab 10/23/21 1754 10/24/21 0428 10/25/21 0315 10/26/21 0311 10/27/21 1339  NA 125* 129* 127* 128* 132*  K 4.1 4.7 3.9 3.8 4.0  CL 87* 94* 95* 99 96*  CO2 _0 GLUCOSE 88 78 90 95 114*  BUN _1 CREATININE 0.69 0.72 0.69 0.69 0.62  CALCIUM 8.6* 8.4* 8.0* 8.2* 8.6*   Liver Function Tests: Recent Labs  Lab 10/23/21 1754 10/24/21 0428 10/27/21 1339  AST 13* 16 11*  ALT _2 ALKPHOS 140* 122 117  BILITOT 0.8 0.8 0.4  PROT 6.9 6.5 5.9*  ALBUMIN 2.3* 2.0* 1.9*   No results for input(s): LIPASE, AMYLASE in the last 168 hours. No results for input(s): AMMONIA in the last 168 hours. CBC: Recent Labs  Lab 10/23/21 1754 10/24/21 0428 10/25/21 0315 10/26/21 0311 10/27/21 1339  WBC 13.7* 14.1* 11.3*  8.5 9.3  NEUTROABS 10.3*  --   --   --   --   HGB 8.8* 8.6* 7.7* 7.4* 7.7*  HCT 28.1* 27.2* 24.3* 23.1* 25.2*  MCV 86.5 85.5 84.7 84.3 85.7  PLT 399 406* 434* 390 441*   Cardiac Enzymes: No results for input(s): CKTOTAL, CKMB, CKMBINDEX, TROPONINI in  the last 168 hours. BNP: Invalid input(s): POCBNP CBG: No results for input(s): GLUCAP in the last 168 hours. D-Dimer No results for input(s): DDIMER in the last 72 hours. Hgb A1c No results for input(s): HGBA1C in the last 72 hours. Lipid Profile No results for input(s): CHOL, HDL, LDLCALC, TRIG, CHOLHDL, LDLDIRECT in the last 72 hours. Thyroid function studies No results for input(s): TSH, T4TOTAL, T3FREE, THYROIDAB in the last 72 hours.  Invalid input(s): FREET3 Anemia work up No results for input(s): VITAMINB12, FOLATE, FERRITIN, TIBC, IRON, RETICCTPCT in the last 72 hours. Urinalysis    Component Value Date/Time   COLORURINE AMBER (A) 09/01/2021 2000   APPEARANCEUR CLEAR 09/01/2021 2000   LABSPEC 1.025 09/01/2021 2000   PHURINE 5.0 09/01/2021 2000   GLUCOSEU NEGATIVE 09/01/2021 2000   HGBUR NEGATIVE 09/01/2021 2000   BILIRUBINUR NEGATIVE 09/01/2021 [place] NEGATIVE 09/01/2021 2000   PROTEINUR 30 (A) 09/01/2021 2000   NITRITE NEGATIVE 09/01/2021 2000   LEUKOCYTESUR NEGATIVE 09/01/2021 2000   Sepsis Labs Invalid input(s): PROCALCITONIN,  WBC,  LACTICIDVEN Microbiology Recent Results (from the past 240 hour(s))  Resp Panel by RT-PCR (Flu A&B, Covid) Nasopharyngeal Swab     Status: None   Collection Time: 10/23/21  9:59 PM   Specimen: Nasopharyngeal Swab; Nasopharyngeal(NP) swabs in vial transport medium  Result Value Ref Range Status   SARS Coronavirus 2 by RT PCR NEGATIVE NEGATIVE Final    Comment: (NOTE) SARS-CoV-2 target nucleic acids are NOT DETECTED.  The SARS-CoV-2 RNA is generally detectable in upper respiratory specimens during the acute phase of infection. The lowest concentration of SARS-CoV-2 viral copies this assay can detect is 138 copies/mL. A negative result does not preclude SARS-Cov-2 infection and should not be used as the sole basis for treatment or other patient management decisions. A negative result may occur with  improper specimen  collection/handling, submission of specimen other than nasopharyngeal swab, presence of viral mutation(s) within the areas targeted by this assay, and inadequate number of viral copies(<138 copies/mL). A negative result must be combined with clinical observations, patient history, and epidemiological information. The expected result is Negative.  Fact Sheet for Patients:  EntrepreneurPulse.com.au  Fact Sheet for Healthcare Providers:  IncredibleEmployment.be  This test is no t yet approved or cleared by the [place] FDA and  has been authorized for detection and/or diagnosis of SARS-CoV-2 by FDA under an Emergency Use Authorization (EUA). This EUA will remain  in effect (meaning this test can be used) for the duration of the COVID-19 declaration under Section 564(b)(1) of the Act, 21 U.S.C.section 360bbb-3(b)(1), unless the authorization is terminated  or revoked sooner.       Influenza A by PCR NEGATIVE NEGATIVE Final   Influenza B by PCR NEGATIVE NEGATIVE Final    Comment: (NOTE) The Xpert Xpress SARS-CoV-2/FLU/RSV plus assay is intended as an aid in the diagnosis of influenza from Nasopharyngeal swab specimens and should not be used as a sole basis for treatment. Nasal washings and aspirates are unacceptable for Xpert Xpress SARS-CoV-2/FLU/RSV testing.  Fact Sheet for Patients: EntrepreneurPulse.com.au  Fact Sheet for Healthcare Providers: IncredibleEmployment.be  This test is not yet approved  or cleared by the [place] and has been authorized for detection and/or diagnosis of SARS-CoV-2 by FDA under an Emergency Use Authorization (EUA). This EUA will remain in effect (meaning this test can be used) for the duration of the COVID-19 declaration under Section 564(b)(1) of the Act, 21 U.S.C. section 360bbb-3(b)(1), unless the authorization is terminated or revoked.  Performed at [facility], [place] [address] [place], [place] [address]   Expectorated Sputum Assessment w Gram Stain, Rflx to Resp Cult     Status: None   Collection Time: 10/24/21  5:21 AM   Specimen: Expectorated Sputum  Result Value Ref Range Status   Specimen Description EXPECTORATED SPUTUM  Final   Special Requests NONE  Final   Sputum evaluation   Final    THIS SPECIMEN IS ACCEPTABLE FOR SPUTUM CULTURE Performed at [facility], [place] [address] [place], [place] [address]    Report Status 10/24/2021 FINAL  Final  Culture, Respiratory w Gram Stain     Status: None   Collection Time: 10/24/21  5:21 AM  Result Value Ref Range Status   Specimen Description   Final    EXPECTORATED SPUTUM Performed at [facility], [place] [address] [place], [place] [address]    Special Requests   Final    NONE Reflexed from [id number] Performed at [facility], [place] [address], [place], [place] [address]    Gram Stain   Final    ABUNDANT WBC PRESENT, PREDOMINANTLY PMN FEW GRAM POSITIVE COCCI IN PAIRS IN CHAINS Performed at [facility], [place] [address], [place], [place] [address]    Culture FEW STAPHYLOCOCCUS AUREUS  Final   Report Status 10/27/2021 FINAL  Final   Organism ID, Bacteria STAPHYLOCOCCUS AUREUS  Final      Susceptibility   Staphylococcus aureus - MIC*    CIPROFLOXACIN <=0.5 SENSITIVE Sensitive     ERYTHROMYCIN >=8 RESISTANT Resistant     GENTAMICIN <=0.5 SENSITIVE Sensitive     OXACILLIN 1 SENSITIVE Sensitive     TETRACYCLINE <=1 SENSITIVE Sensitive     VANCOMYCIN <=0.5 SENSITIVE Sensitive     TRIMETH/SULFA <=10 SENSITIVE Sensitive     CLINDAMYCIN <=0.25 SENSITIVE Sensitive     RIFAMPIN <=0.5 SENSITIVE Sensitive     Inducible Clindamycin NEGATIVE Sensitive     * FEW STAPHYLOCOCCUS AUREUS  MRSA Next Gen by PCR, Nasal     Status: None   Collection Time: 10/24/21 11:30 AM   Specimen: Nasal Mucosa; Nasal Swab  Result Value  Ref Range Status   MRSA by PCR Next Gen NOT DETECTED NOT DETECTED Final    Comment: (NOTE) The GeneXpert MRSA Assay (FDA approved for NASAL specimens only), is one component of a comprehensive MRSA colonization surveillance program. It is not intended to diagnose MRSA infection nor to guide or monitor treatment for MRSA infections. Test performance is not FDA approved in patients less than 19 years old. Performed at [facility], [place] [address], [place], [place] [address]   Body fluid culture w Gram Stain     Status: None (Preliminary result)   Collection Time: 10/24/21 12:06 PM   Specimen: Lung, Right; Pleural Fluid  Result Value Ref Range Status   Specimen Description   Final    PLEURAL RIGHT Performed at [place] [address] [place], [place] [address]    Special Requests   Final    NONE Performed at [facility], [place] [address], [place], [place] [address]    Gram Stain   Final    NO SQUAMOUS EPITHELIAL CELLS SEEN MODERATE WBC SEEN NO ORGANISMS SEEN CYTOSPIN SMEAR    Culture   Final    NO GROWTH 3 DAYS Performed at [facility], [address], [place], [place] [address]    Report Status PENDING  Incomplete  Resp Panel by RT-PCR (Flu A&B, Covid) Nasopharyngeal Swab     Status: None   Collection Time: 10/27/21 12:06 PM   Specimen: Nasopharyngeal Swab; Nasopharyngeal(NP) swabs in vial transport medium  Result Value Ref Range Status   SARS Coronavirus 2 by RT PCR NEGATIVE NEGATIVE Final    Comment: (NOTE) SARS-CoV-2 target nucleic acids are NOT DETECTED.  The SARS-CoV-2 RNA is generally detectable in upper respiratory specimens during the acute phase of infection. The lowest concentration of SARS-CoV-2 viral copies this assay can detect is 138 copies/mL. A negative result does not preclude SARS-Cov-2 infection and should not be used as the sole basis for treatment or other patient management  decisions. A negative result may occur with  improper specimen collection/handling, submission of specimen other than nasopharyngeal swab, presence of viral mutation(s) within the areas targeted by this assay, and inadequate number of viral copies(<138 copies/mL). A negative result must be combined with clinical observations, patient history, and epidemiological information. The expected result is Negative.  Fact Sheet for Patients:  EntrepreneurPulse.com.au  Fact Sheet for Healthcare Providers:  IncredibleEmployment.be  This test is no t yet approved or cleared by the [place] FDA and  has been authorized for detection and/or diagnosis of SARS-CoV-2 by FDA under an Emergency Use Authorization (EUA). This EUA will remain  in effect (meaning this test can be used) for the duration of the COVID-19 declaration under Section 564(b)(1) of the Act, 21 U.S.C.section 360bbb-3(b)(1), unless the authorization is terminated  or revoked sooner.       Influenza A by PCR NEGATIVE NEGATIVE Final   Influenza B by PCR NEGATIVE NEGATIVE Final    Comment: (NOTE) The Xpert Xpress SARS-CoV-2/FLU/RSV plus assay is intended as an aid in the diagnosis of influenza from Nasopharyngeal swab specimens and should not be used as a sole basis for treatment. Nasal washings and aspirates are unacceptable for Xpert Xpress SARS-CoV-2/FLU/RSV testing.  Fact Sheet for Patients: EntrepreneurPulse.com.au  Fact Sheet for Healthcare Providers: IncredibleEmployment.be  This test is not yet approved or cleared by the [place] FDA and has been authorized for detection and/or diagnosis of SARS-CoV-2 by FDA under an Emergency Use Authorization (EUA). This EUA will remain in effect (meaning this test can be used) for the duration of the COVID-19 declaration under Section 564(b)(1) of the Act, 21 U.S.C. section 360bbb-3(b)(1), unless the  authorization is terminated or revoked.  Performed at [facility], [place] [address], [place], [place] [address]      Time coordinating discharge: 39 minutes  SIGNED:   Georgette Shell, MD  [facility] 10/28/2021, 9:20 AM

## 2021-10-28 NOTE — Plan of Care (Signed)
  Problem: Education: Goal: Knowledge of General Education information will improve Description: Including pain rating scale, medication(s)/side effects and non-pharmacologic comfort measures Outcome: Adequate for Discharge   Problem: Health Behavior/Discharge Planning: Goal: Ability to manage health-related needs will improve Outcome: Adequate for Discharge   Problem: Clinical Measurements: Goal: Ability to maintain clinical measurements within normal limits will improve Outcome: Adequate for Discharge Goal: Will remain free from infection Outcome: Adequate for Discharge Goal: Diagnostic test results will improve Outcome: Adequate for Discharge Goal: Respiratory complications will improve Outcome: Adequate for Discharge Goal: Cardiovascular complication will be avoided Outcome: Adequate for Discharge   Problem: Activity: Goal: Risk for activity intolerance will decrease Outcome: Adequate for Discharge   Problem: Nutrition: Goal: Adequate nutrition will be maintained Outcome: Adequate for Discharge   Problem: Pain Managment: Goal: General experience of comfort will improve Outcome: Adequate for Discharge   Problem: Safety: Goal: Ability to remain free from injury will improve Outcome: Adequate for Discharge   Problem: Skin Integrity: Goal: Risk for impaired skin integrity will decrease Outcome: Adequate for Discharge   Problem: Education: Goal: Knowledge of disease or condition will improve Outcome: Adequate for Discharge Goal: Knowledge of the prescribed therapeutic regimen will improve Outcome: Adequate for Discharge Goal: Individualized Educational Video(s) Outcome: Adequate for Discharge   Problem: Activity: Goal: Ability to tolerate increased activity will improve Outcome: Adequate for Discharge Goal: Will verbalize the importance of balancing activity with adequate rest periods Outcome: Adequate for Discharge   Problem: Respiratory: Goal: Ability to  maintain a clear airway will improve Outcome: Adequate for Discharge Goal: Levels of oxygenation will improve Outcome: Adequate for Discharge Goal: Ability to maintain adequate ventilation will improve Outcome: Adequate for Discharge

## 2021-10-28 NOTE — TOC Progression Note (Addendum)
Transition of Care [facility]) - Progression Note    Patient Details  Name: Kristen [name] MRN: [id number] Date of Birth: 31-Jan-1946  [facility] CM/SW Contact  Alistair Senft, Juliann Pulse, RN Phone Number: 10/28/2021, 9:37 AM  Clinical Narrative: [facility] awaiting auth in medical review-await outcome. [address] for SNF if auth.   -11:22a-P2P recc-MD aware-tel#[phone],option 5,mbr #[id number],YBF February 09, 2045,[place]-info provided to MD-await outcome. 1p-P2P done-auth not approved for [facility] SNF;LTC recc.spoke to patient's sister Cynthia-in agreement to [facility] to accept for LTC.   Expected Discharge Plan: Skilled Nursing Facility Barriers to Discharge: Insurance Authorization  Expected Discharge Plan and Services Expected Discharge Plan: [place]       Living arrangements for the past 2 months: [place] Expected Discharge Date: 10/28/21                                     Social Determinants of Health (SDOH) Interventions    Readmission Risk Interventions Readmission Risk Prevention Plan 06/17/2021 06/02/2021 09/29/2020  Transportation Screening Complete Complete Complete  PCP or Specialist Appt within 5-7 Days - - Complete  Home Care Screening - - Complete  Medication Review (RN CM) - - Complete  Medication Review ([place]) - Complete -  PCP or Specialist appointment within 3-5 days of discharge - Complete -  [place] or Home Care Consult - Complete -  SW Recovery Care/Counseling Consult Complete Complete -  Palliative Care Screening Complete Complete -  Skilled Nursing Facility Complete Complete -  Some recent data might be hidden

## 2021-12-02 ENCOUNTER — Institutional Professional Consult (permissible substitution): Payer: Medicare Other | Admitting: Pulmonary Disease

## 2021-12-14 IMAGING — CT CT ANGIO CHEST
2 of 6 series · 18 of 36 positions shown · IV contrast (omnipaque)
Comparison: 10/01/2021, CT chest.  Current chest radiograph.

CLINICAL DATA: Dyspnea and hypoxia. Current chest radiograph shows
significant right lung airspace disease.

EXAM:
CT ANGIOGRAPHY CHEST WITH CONTRAST
TECHNIQUE: Multidetector CT imaging of the chest was performed using the
standard protocol during bolus administration of intravenous
contrast. Multiplanar CT image reconstructions and MIPs were
obtained to evaluate the vascular anatomy.
CONTRAST:  60mL OMNIPAQUE IOHEXOL 350 MG/ML SOLN

[Series 5: thins · axial · 0.69mm/px · z∈[-338,-18]mm · 17 of 359 slices shown]
[im 20/359  lung]
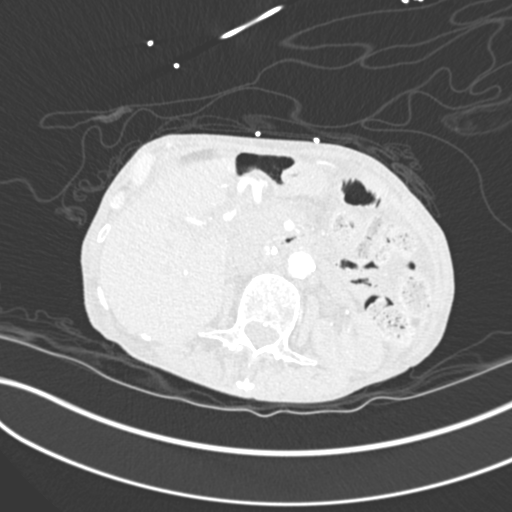
[im 40/359  mediastinal]
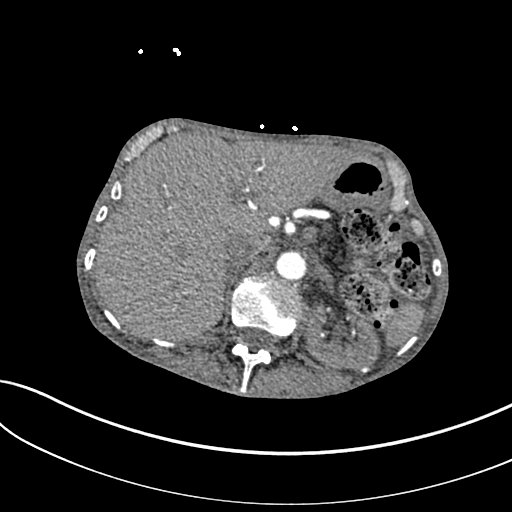
[im 60/359  lung]
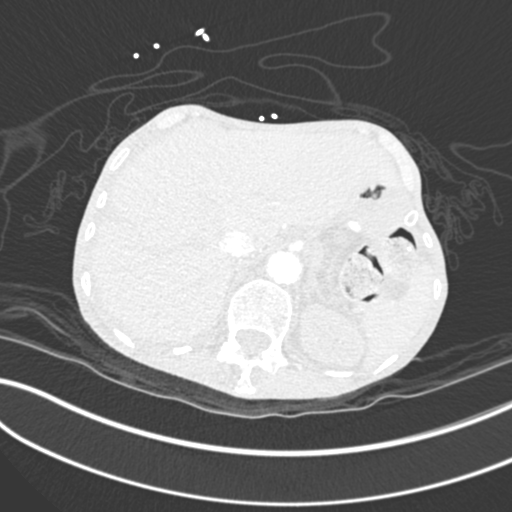
[im 80/359  mediastinal]
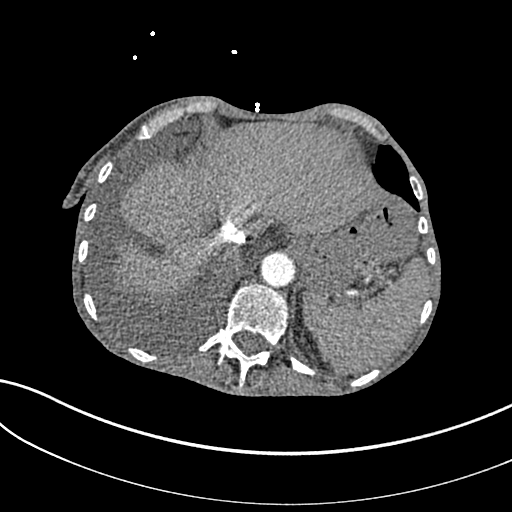
[im 100/359  lung]
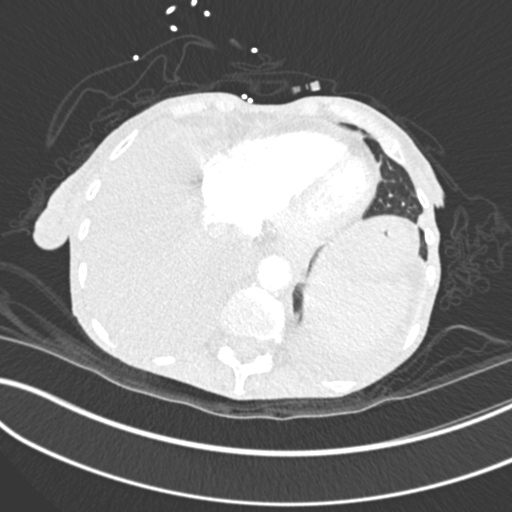
[im 120/359  mediastinal]
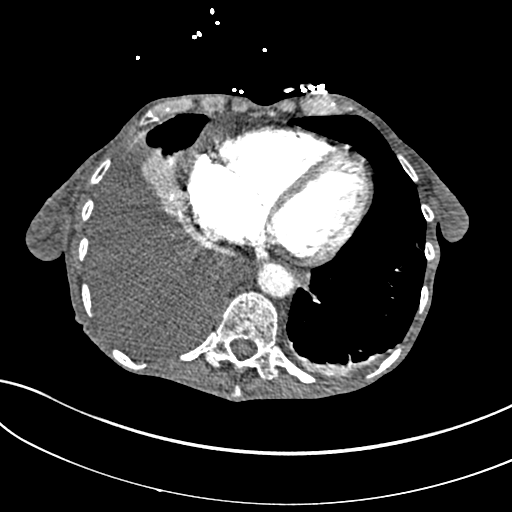
[im 140/359  lung]
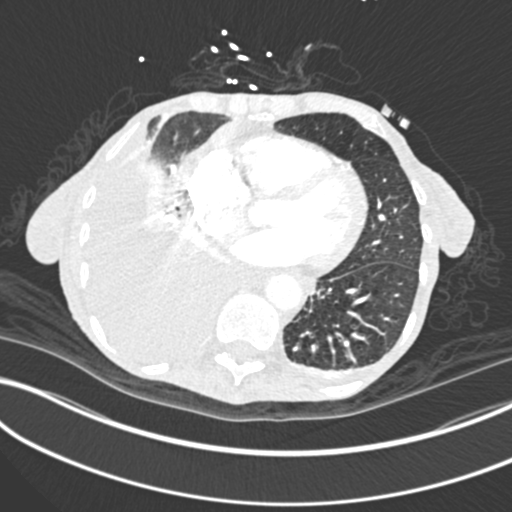
[im 160/359  mediastinal]
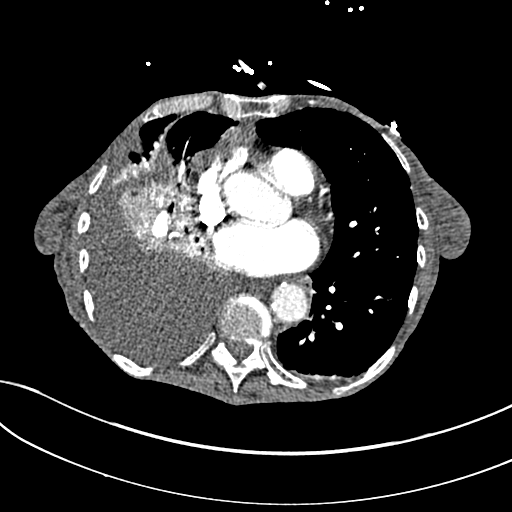
[im 180/359  lung]
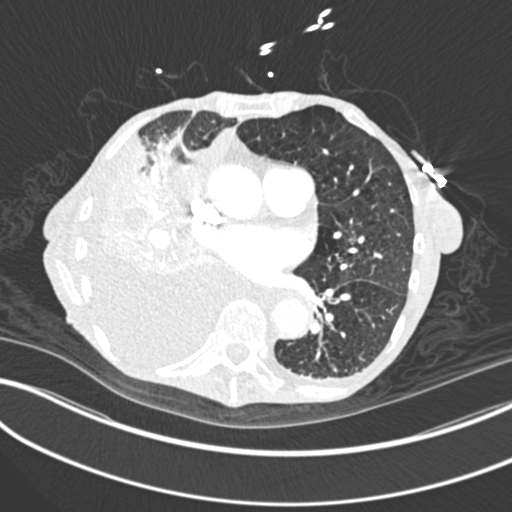
[im 199/359  mediastinal]
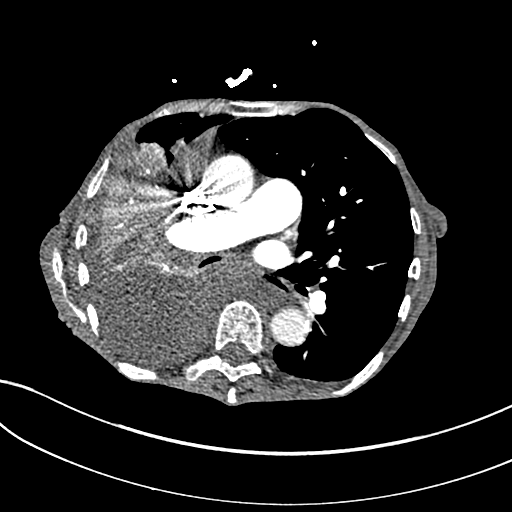
[im 219/359  lung]
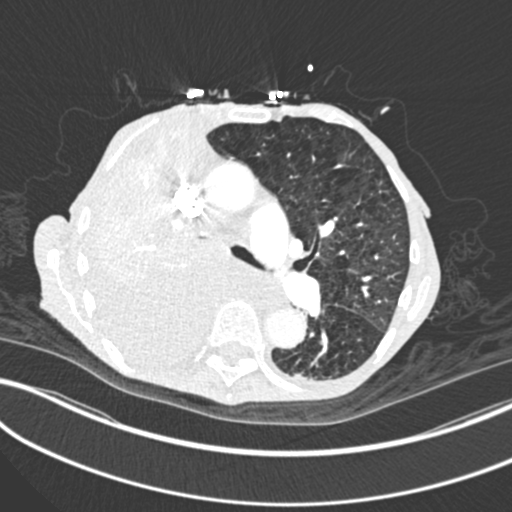
[im 239/359  mediastinal]
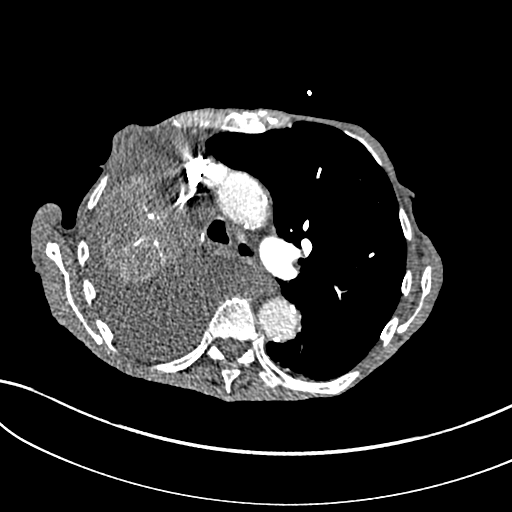
[im 259/359  lung]
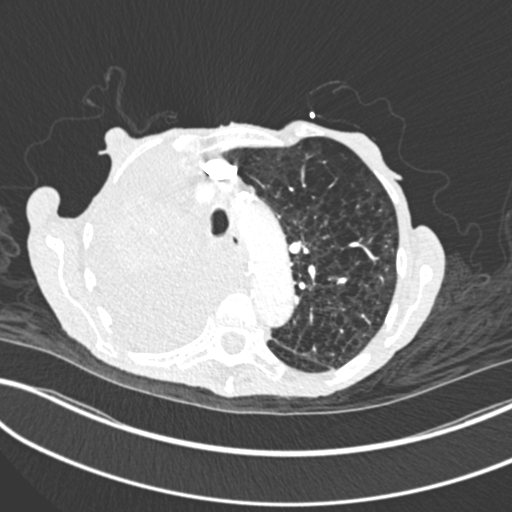
[im 279/359  mediastinal]
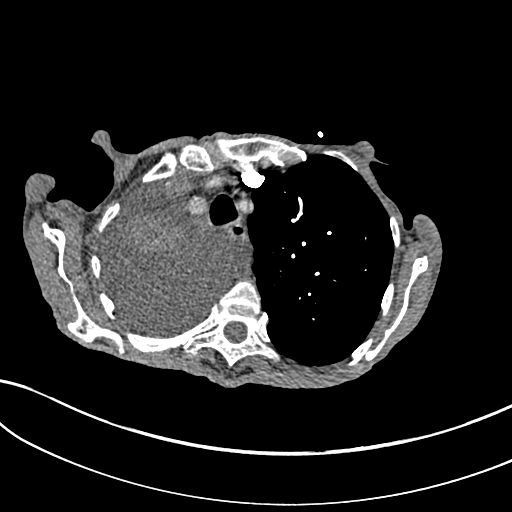
[im 299/359  lung]
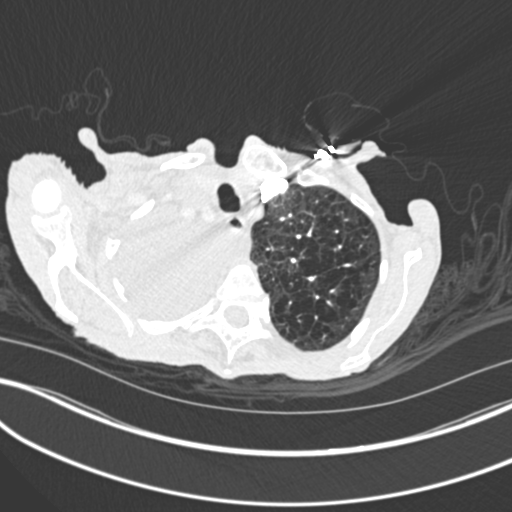
[im 319/359  mediastinal]
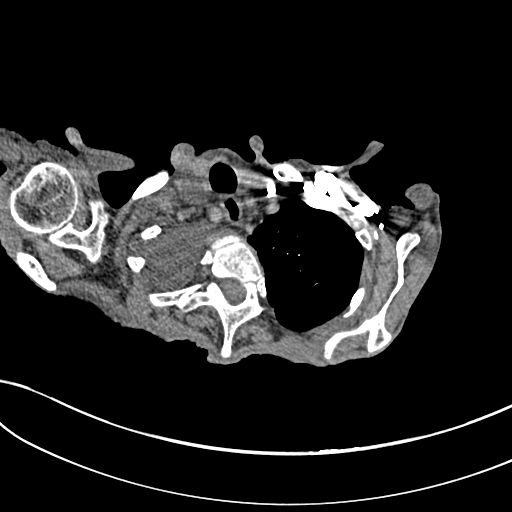
[im 339/359  lung]
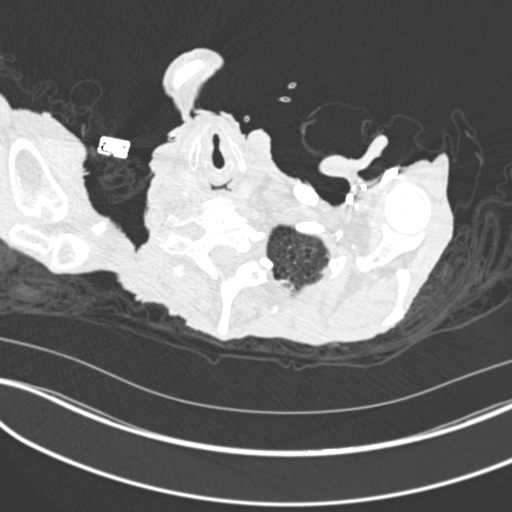

[Series 6: coronal mpr · coronal · 0.66mm/px · 1 of 111 slices shown]
[im 56/111  mediastinal]
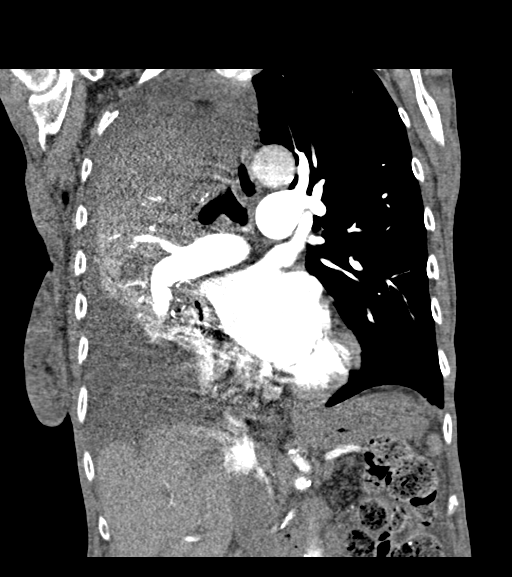

[18 of 36 positions shown; findings below may reference images not displayed]

FINDINGS: Cardiovascular: Pulmonary arteries are well opacified. There is no
evidence of a pulmonary embolism.

Heart is normal in size. No pericardial effusion. Mild left coronary
artery calcifications. Great vessels are normal in caliber. No
aortic dissection. Mild aortic atherosclerosis. Is

Mediastinum/Nodes: No neck base, mediastinal or hilar masses for
enlarged lymph nodes. Trachea is unremarkable. Esophagus not
well-defined, but grossly unremarkable.

Lungs/Pleura: Occluded distal right main bronchus likely from
secretions/mucus plugging. Most of the right lung is collapsed.
Small portions of the right middle and right upper lobe are aerated.
Large right pleural effusion.

Left lung shows advanced emphysema. Mild dependent opacity in the
left lower lobe and base of the left upper lobe lingula consistent
with atelectasis. Trace left pleural effusion.

Upper Abdomen: No acute abnormality.

Musculoskeletal: No fracture or acute finding.  No bone lesion.

Review of the MIP images confirms the above findings.
IMPRESSION: 1. No evidence of a pulmonary embolism.
2. Large right pleural effusion with near complete
atelectasis/collapse the right lung.
3. Distal right main bronchus is occluded. This is most likely due
to mucous plugging. Endobronchial neoplasm is not excluded, however.
Follow-up bronchoscopy is recommended.
4. Trace left pleural effusion with mild dependent left lower lobe
and left upper lobe lingula atelectasis.
5. No evidence of pulmonary edema.
6. Advanced emphysema.

Aortic Atherosclerosis (9AYT2-8LE.E) and Emphysema (9AYT2-WLF.R).

## 2021-12-14 IMAGING — DX DG CHEST 1V PORT
2 series · 2 of 2 positions shown · non-contrast
Comparison: Chest x-ray 10/03/2021.  CT of the chest 10/01/2021.

CLINICAL DATA: Questionable pneumonia.

EXAM:
PORTABLE CHEST 1 VIEW

[chest ap (1 of 2)]
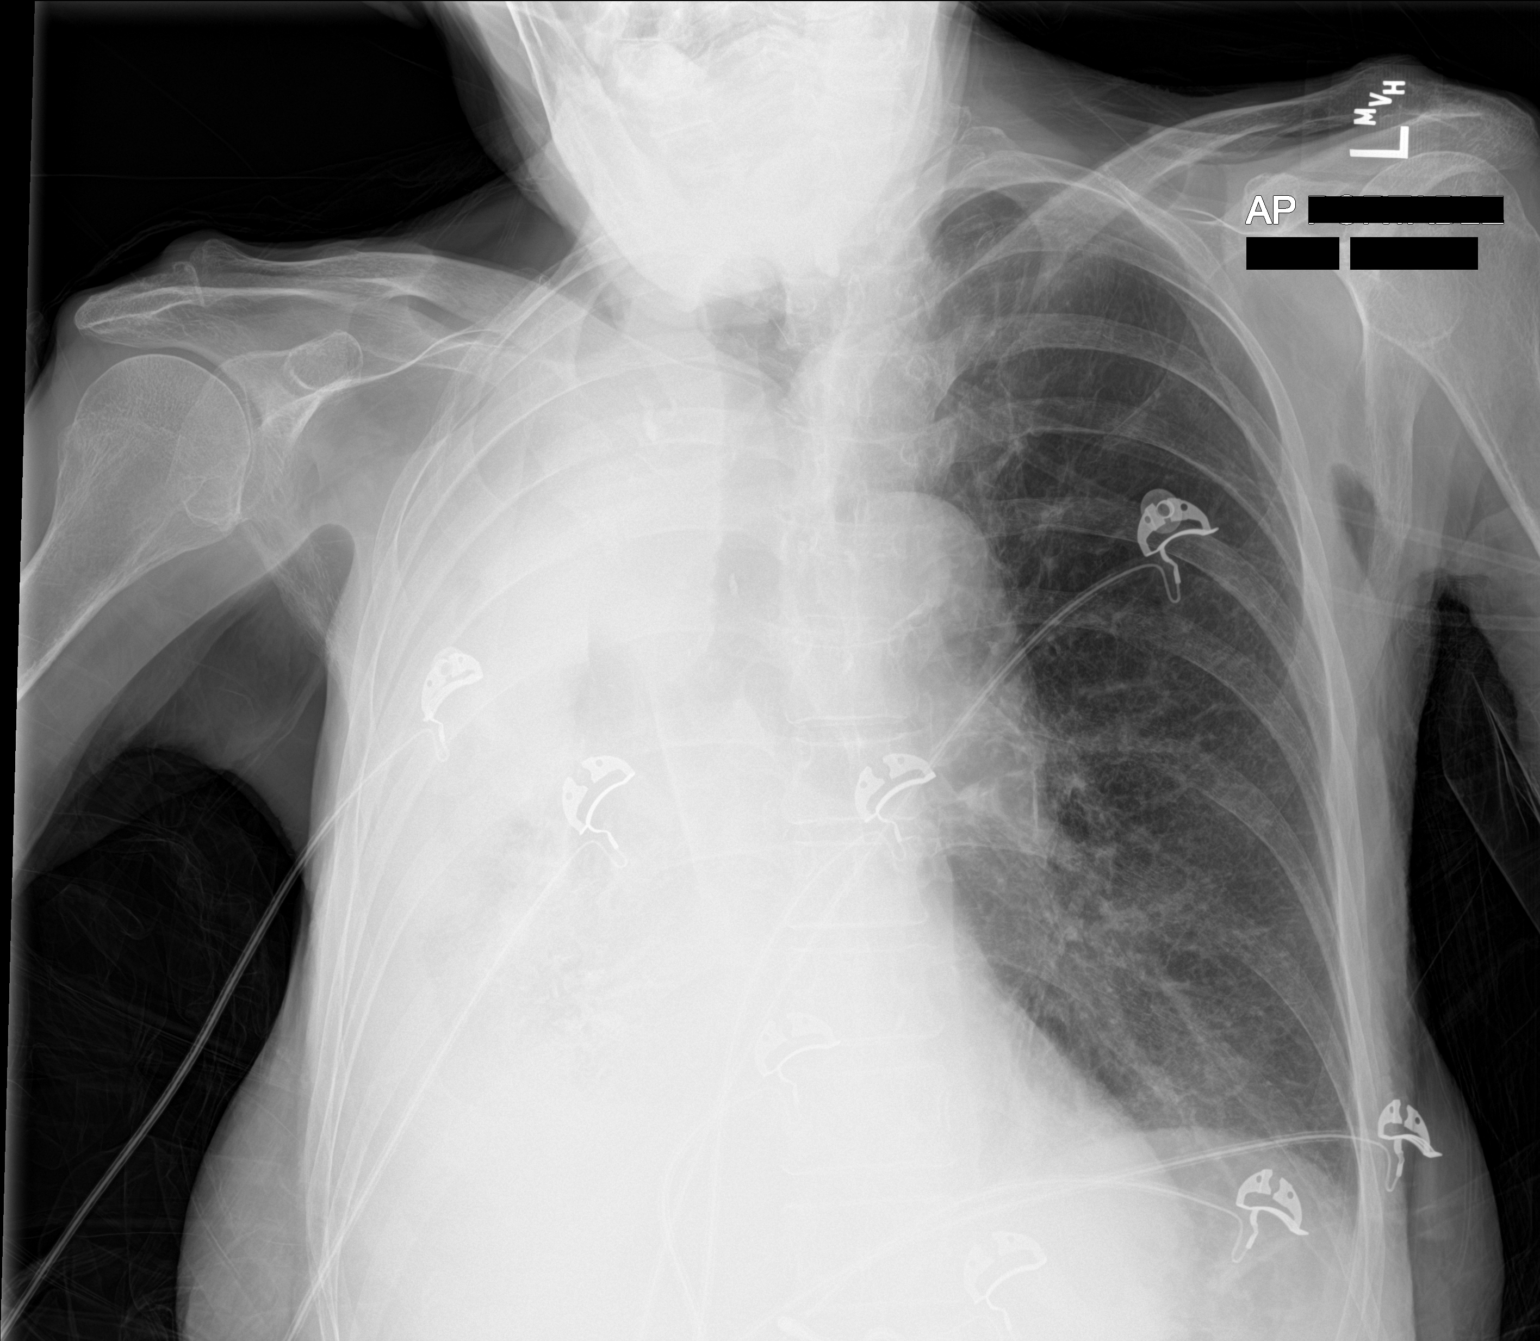

[chest ap (2 of 2)]
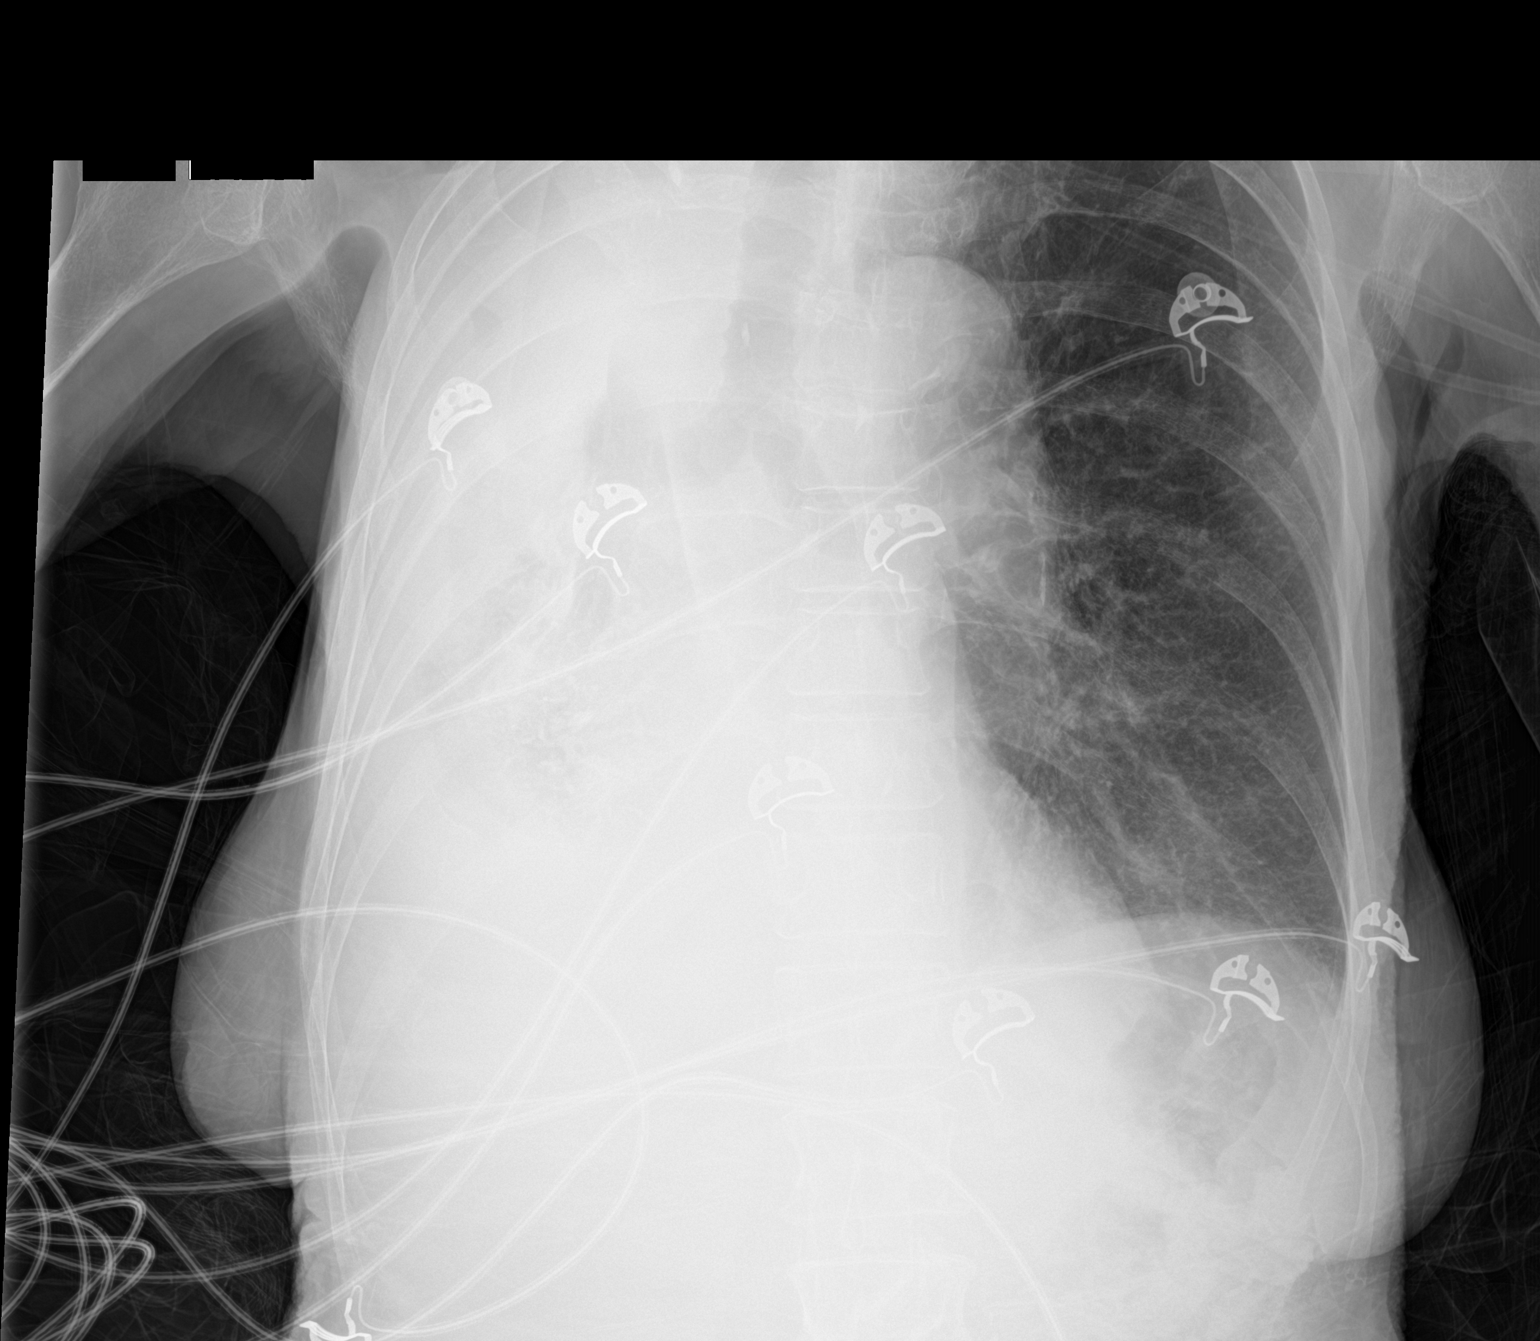

[2 of 2 positions shown; findings below may reference images not displayed]

FINDINGS: There is complete opacification of the right hemithorax which
appears similar to the prior examination. Right pleural effusion has
likely mildly increased in there is increased opacification in the
aerated right lower lung. Left lung is clear. There is no
pneumothorax. Cardiomediastinal silhouette is within normal limits
and stable. No acute fractures.
IMPRESSION: 1. Increasing large right pleural effusion.
2. Increasing right lower lung airspace disease.

## 2021-12-15 IMAGING — DX DG CHEST 1V
1 series · 1 of 1 positions shown · non-contrast
Comparison: 10/23/2021

CLINICAL DATA: Status post thoracentesis

EXAM:
CHEST  1 VIEW

[chest ap]
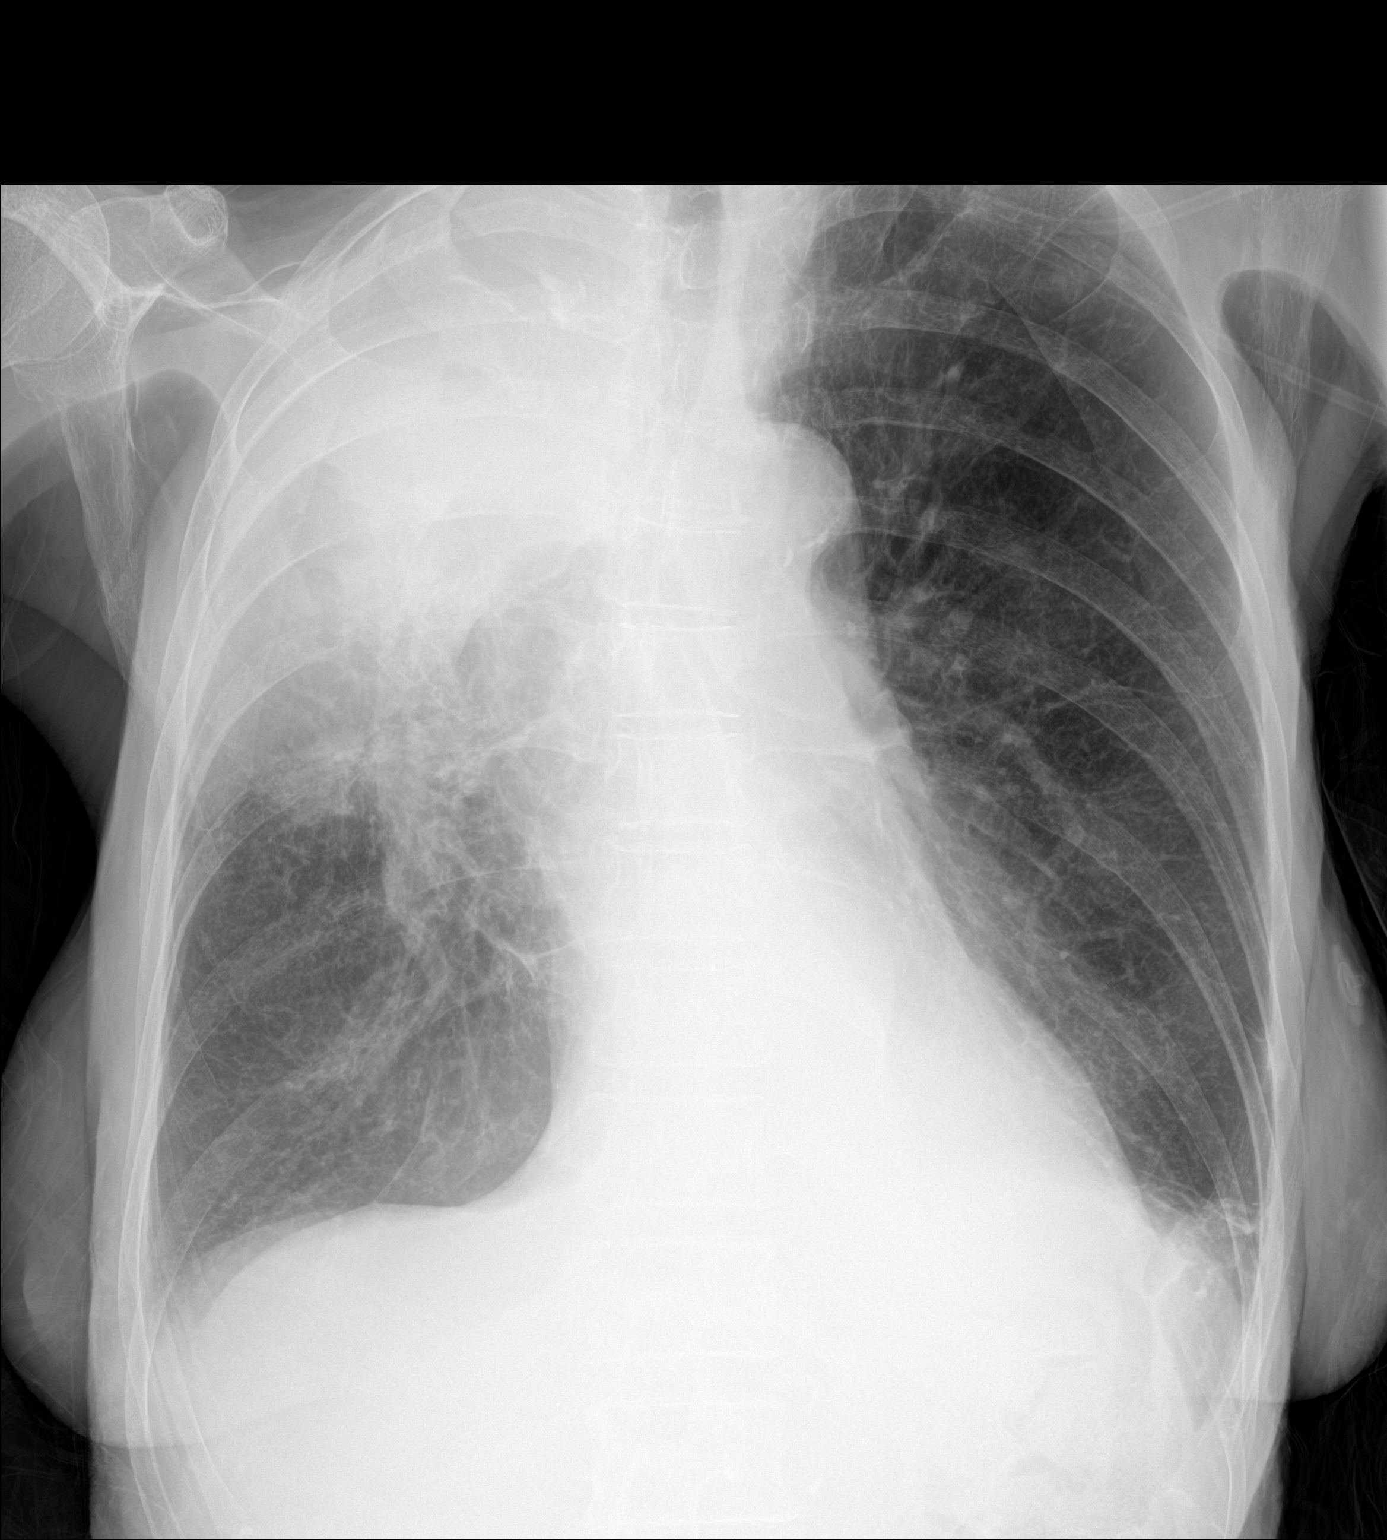

[1 of 1 positions shown; findings below may reference images not displayed]

FINDINGS: Status post right thoracentesis with diminished volume of a right
pleural effusion and significant improvement in aeration of the
lower right lung. There remains a large, dense, masslike
consolidation of the right upper lobe. Emphysema of the left lung.
Heart and mediastinum are unremarkable.
IMPRESSION: 1. Status post right thoracentesis with diminished volume of a right
pleural effusion and significant improvement in aeration of the
lower right lung. There remains a large, dense, masslike
consolidation of the right upper lobe.
2. No pneumothorax.
3. Emphysema.

## 2021-12-15 IMAGING — US US THORACENTESIS ASP PLEURAL SPACE W/IMG GUIDE
1 series · 6 of 6 positions shown · non-contrast
Comparison: none

INDICATION: Patient with history of COPD, chronic respiratory failure, AFib,
pneumonia and hypoxia. Request for IR to perform diagnostic and
therapeutic thoracentesis for large right pleural effusion.

[Series 1: us thoracentesis asp pleural s mc & wl · 6 acquisitions, 6 frames shown]
[im 1/6]
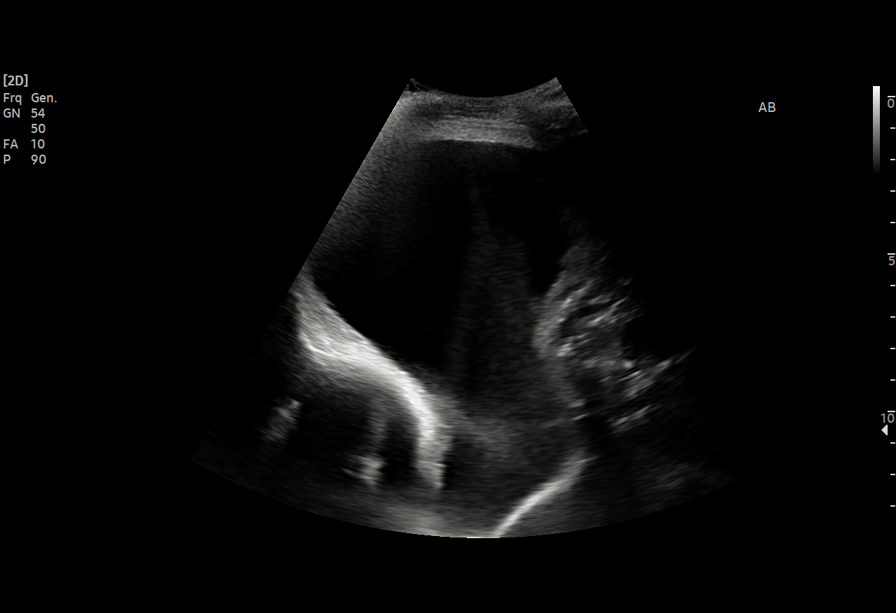
[im 2/6]
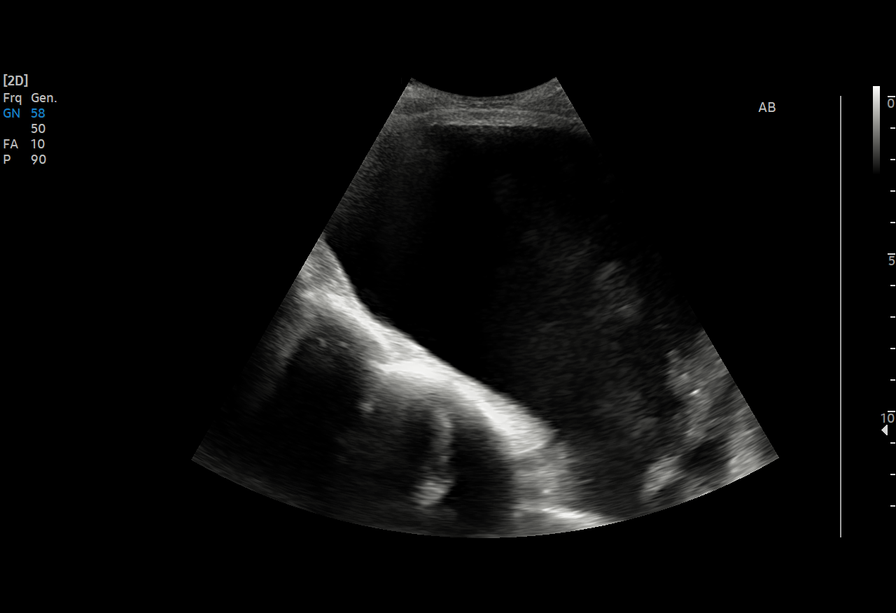
[im 3/6]
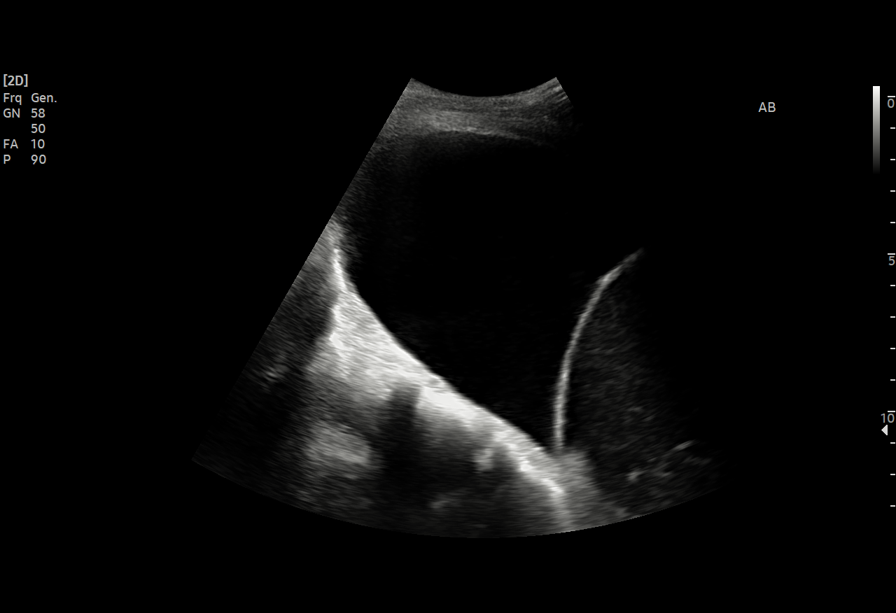
[im 4/6]
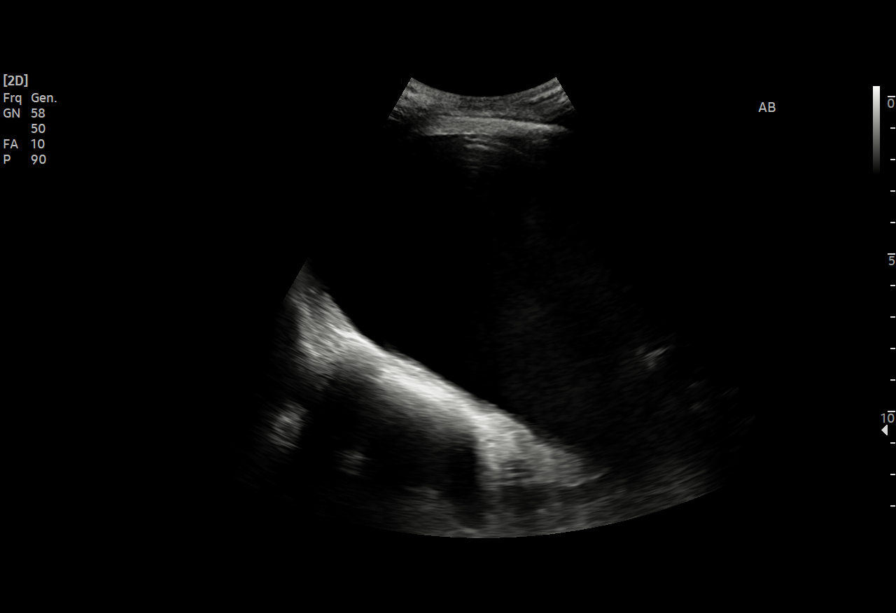
[im 5/6]
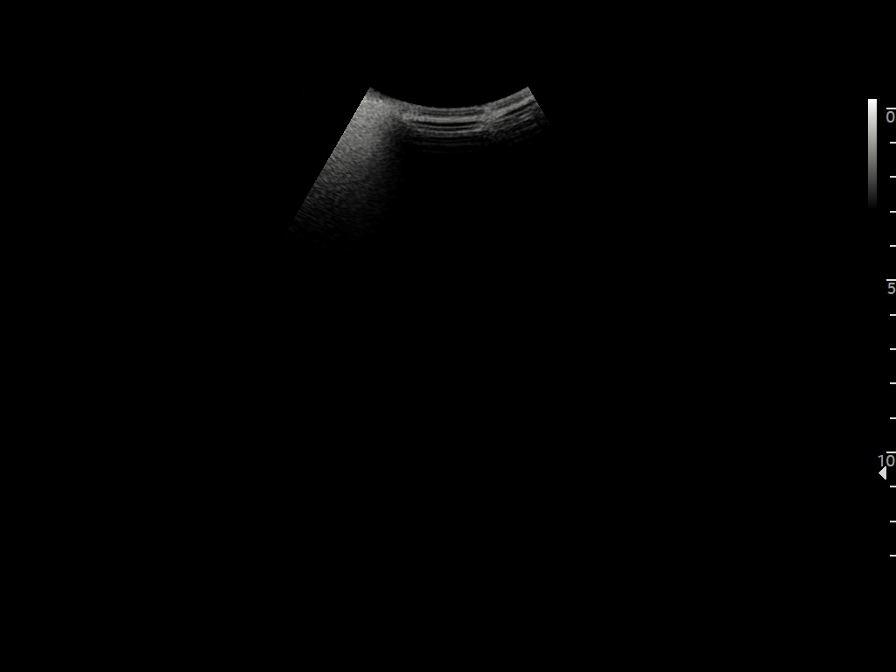
[im 6/6]
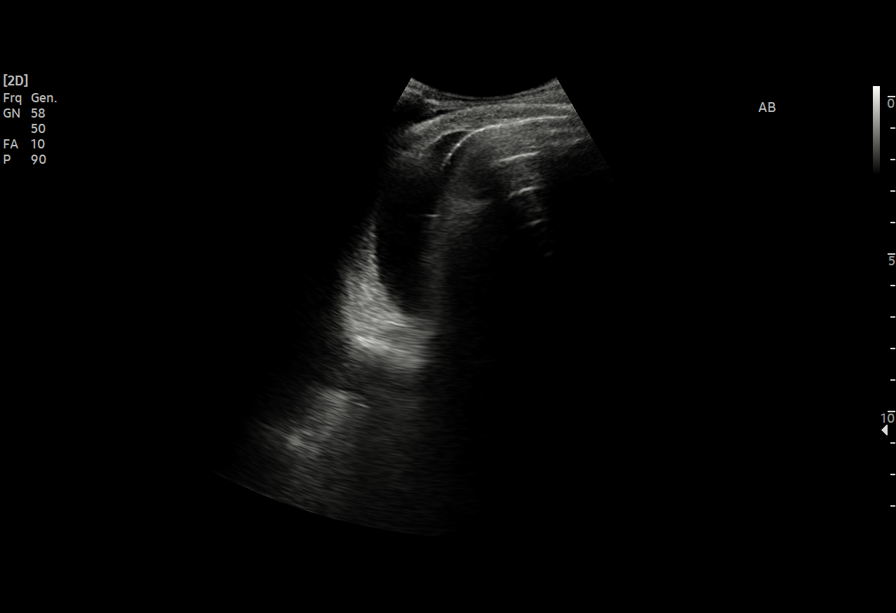

[6 of 6 positions shown; findings below may reference images not displayed]

EXAM:
ULTRASOUND GUIDED DIAGNOSTIC AND THERAPEUTIC THORACENTESIS

MEDICATIONS:
10ml 1% lidocaine

COMPLICATIONS:
None immediate.

PROCEDURE:
An ultrasound guided thoracentesis was thoroughly discussed with the
patient and questions answered. The benefits, risks, alternatives
and complications were also discussed. The patient understands and
wishes to proceed with the procedure. Written consent was obtained.

Ultrasound was performed to localize and mark an adequate pocket of
fluid in the right chest. The area was then prepped and draped in
the normal sterile fashion. 1% Lidocaine was used for local
anesthesia. Under ultrasound guidance a 6 Fr Safe-T-Centesis
catheter was introduced. Thoracentesis was performed. The catheter
was removed and a dressing applied.
FINDINGS: A total of approximately 1.3 L of hazy, yellow fluid was removed.
Samples were sent to the laboratory as requested by the clinical
team.
IMPRESSION: Successful ultrasound guided right thoracentesis yielding 1.3 L of
pleural fluid.

## 2021-12-19 ENCOUNTER — Institutional Professional Consult (permissible substitution): Payer: Medicare Other | Admitting: Pulmonary Disease

## 2021-12-19 NOTE — Progress Notes (Deleted)
Synopsis: Referred in January 2023 for COPD by Jules Husbands, MD  Subjective:   PATIENT ID: Kristen [name] GENDER: female DOB: [birth year], MRN: [id number]   HPI  No chief complaint on file.  Kristen [name] is a 76 year old woman, former smoker with COPD, chronic hypoxemic respiratory failure on 3L O2, HFpEF, and atrial fibrillation on Xarelto who has been referred to pulmonary clinic for hospital follow up.    S/p thoracentesis on 11/11 and 12/2 with cytology negative for malignancy but concerning for inflammation.   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[place])      Family History  Problem Relation Age of Onset   Diabetes Mother    Diabetes Mellitus II Mother    Diabetes Father    Diverticulitis Sister      Social History   Socioeconomic History   Marital status: Divorced    Spouse name: Not on file   Number of children: Not on file   Years of education: Not on file   Highest education level: Not on file  Occupational History    Comment: RETIRED  Tobacco Use   Smoking status: Former    Packs/day: 0.25    Types: Cigarettes    Quit date: 10/16/2019    Years since quitting: 2.1   Smokeless tobacco: Never   Tobacco comments:    2-3 per day   Vaping Use   Vaping Use: Never used  Substance and Sexual Activity   Alcohol use: Yes    Comment: occasionally   Drug use: Never   Sexual activity: Not on file  Other Topics Concern   Not on file  Social History Narrative   Not on file   Social Determinants of Health   Financial Resource Strain: Not on file  Food Insecurity: Not on file  Transportation Needs: Not on file  Physical Activity: Not on file  Stress: Not on file  Social  Connections: Not on file  Intimate Partner Violence: Not on file     Allergies  Allergen Reactions   Gabapentin Other (See Comments)    Hallucination/ nightmares- "Allergic," per [facility]     Outpatient Medications Prior to Visit  Medication Sig Dispense Refill   acetaminophen (TYLENOL) 325 MG tablet Take 325 mg by mouth every 6 (six) hours as needed for moderate pain.     albuterol (VENTOLIN HFA) 108 (90 Base) MCG/ACT inhaler INHALE 1-2 PUFFS BY MOUTH EVERY 6 HOURS AS NEEDED FOR WHEEZE OR SHORTNESS OF BREATH (Patient taking differently: Inhale 2 puffs into the lungs See admin instructions. Inhale 2 puffs into the lungs two times a day and every 6 hours as needed for COPD) 17 each 1   Amino Acids-Protein Hydrolys (PRO-STAT AWC) LIQD Take 30 mLs by mouth 2 (two) times daily.     amiodarone (PACERONE) 200 MG tablet TAKE 1 TABLET BY MOUTH EVERY DAY (Patient taking differently: Take 200 mg by mouth daily.) 30 tablet 0   bisacodyl (DULCOLAX) 10 MG suppository Place 10 mg rectally daily as needed (for constipation).     busPIRone (BUSPAR) 10 MG tablet Take 1 tablet (10 mg total) by mouth daily. 90 tablet 2   clopidogrel (  PLAVIX) 75 MG tablet Take 1 tablet (75 mg total) by mouth daily.     docusate sodium (COLACE) 100 MG capsule Take 1 capsule (100 mg total) by mouth 2 (two) times daily as needed for mild constipation. (Patient taking differently: Take 100 mg by mouth 2 (two) times daily.) 10 capsule 0   feeding supplement (ENSURE ENLIVE / ENSURE PLUS) LIQD Take 237 mLs by mouth 2 (two) times daily between meals. (Patient not taking: Reported on 10/23/2021)     ferrous sulfate 324 MG TBEC Take 324 mg by mouth 2 (two) times daily with a meal.     fluticasone (FLONASE) 50 MCG/ACT nasal spray Place 1 spray into both nostrils daily as needed for allergies or rhinitis. 48 mL 1   ipratropium-albuterol (DUONEB) 0.5-2.5 (3) MG/3ML SOLN TAKE 3 MLS BY NEBULIZATION EVERY 6 (SIX) HOURS AS NEEDED (SHORTNESS OF  BREATH/WHEEZING.). (Patient taking differently: Take 3 mLs by nebulization every 6 (six) hours as needed (shortness of breath or wheezing).) 360 mL 1   ketoconazole (NIZORAL) 2 % cream Apply 1 application topically See admin instructions. Apply to rash on left hand once a day (1st digit)     magnesium oxide (MAG-OX) 400 MG tablet Take 400 mg by mouth daily.     midodrine (PROAMATINE) 5 MG tablet Take 1 tablet (5 mg total) by mouth 3 (three) times daily with meals.     mirtazapine (REMERON) 15 MG tablet Take 1 tablet (15 mg total) by mouth at bedtime as needed (sleep). (Patient taking differently: Take 15 mg by mouth at bedtime.) 90 tablet 2   mometasone-formoterol (DULERA) 200-5 MCG/ACT AERO Inhale 1 puff into the lungs in the morning and at bedtime. 13 each 2   Multiple Vitamin-Folic Acid TABS Take 1 tablet by mouth daily with breakfast.     oxyCODONE-acetaminophen (PERCOCET/ROXICET) 5-325 MG tablet Take 1 tablet by mouth every 4 (four) hours as needed for severe pain. 30 tablet 0   pantoprazole (PROTONIX) 40 MG tablet Take 1 tablet (40 mg total) by mouth daily at 6 (six) AM. (Patient taking differently: Take 40 mg by mouth every other day.)     polyethylene glycol (MIRALAX / GLYCOLAX) 17 g packet Take 17 g by mouth daily as needed for moderate constipation. (Patient taking differently: Take 17 g by mouth See admin instructions. Mix 17 grams of powder into 6 ounces of fluid and drink every morning AND once a day as needed for constipation) 14 each 0   traZODone (DESYREL) 50 MG tablet Take 25 mg by mouth at bedtime as needed for sleep.     umeclidinium bromide (INCRUSE ELLIPTA) 62.5 MCG/INH AEPB INHALE 1 PUFF BY MOUTH EVERY DAY (Patient taking differently: Inhale 1 puff into the lungs daily.) 30 each 4   vitamin C (ASCORBIC ACID) 500 MG tablet Take 500 mg by mouth daily.     XARELTO 15 MG TABS tablet TAKE 1 TABLET (15 MG TOTAL) BY MOUTH DAILY. 90 tablet 2   No facility-administered medications prior  to visit.   Review of Systems  Constitutional:  Negative for chills, fever, malaise/fatigue and weight loss.  HENT:  Negative for congestion, sinus pain and sore throat.   Eyes: Negative.   Respiratory:  Negative for cough, hemoptysis, sputum production, shortness of breath and wheezing.   Cardiovascular:  Negative for chest pain, palpitations, orthopnea, claudication and leg swelling.  Gastrointestinal:  Negative for abdominal pain, heartburn, nausea and vomiting.  Genitourinary: Negative.   Musculoskeletal:  Negative for joint  pain and myalgias.  Skin:  Negative for rash.  Neurological:  Negative for weakness.  Endo/Heme/Allergies: Negative.   Psychiatric/Behavioral: Negative.     Objective:  There were no vitals filed for this visit.  Physical Exam Constitutional:      General: She is not in acute distress.    Appearance: She is not ill-appearing.  HENT:     Head: Normocephalic and atraumatic.  Eyes:     General: No scleral icterus.    Conjunctiva/sclera: Conjunctivae normal.     Pupils: Pupils are equal, round, and reactive to light.  Cardiovascular:     Rate and Rhythm: Normal rate and regular rhythm.     Pulses: Normal pulses.     Heart sounds: Normal heart sounds. No murmur heard. Pulmonary:     Effort: Pulmonary effort is normal.     Breath sounds: Normal breath sounds. No wheezing, rhonchi or rales.  Abdominal:     General: Bowel sounds are normal.     Palpations: Abdomen is soft.  Musculoskeletal:     Right lower leg: No edema.     Left lower leg: No edema.  Lymphadenopathy:     Cervical: No cervical adenopathy.  Skin:    General: Skin is warm and dry.  Neurological:     General: No focal deficit present.     Mental Status: She is alert.  Psychiatric:        Mood and Affect: Mood normal.        Behavior: Behavior normal.        Thought Content: Thought content normal.        Judgment: Judgment normal.   CBC    Component Value Date/Time   WBC 9.3  10/27/2021 1339   RBC 2.94 (L) 10/27/2021 1339   HGB 7.7 (L) 10/27/2021 1339   HGB 10.1 (L) 03/04/2021 1453   HCT 25.2 (L) 10/27/2021 1339   HCT 30.7 (L) 03/04/2021 1453   PLT 441 (H) 10/27/2021 1339   PLT 332 03/04/2021 1453   MCV 85.7 10/27/2021 1339   MCV 96 03/04/2021 1453   MCH 26.2 10/27/2021 1339   MCHC 30.6 10/27/2021 1339   RDW 17.8 (H) 10/27/2021 1339   RDW 11.6 (L) 03/04/2021 1453   LYMPHSABS 2.0 10/23/2021 1754   LYMPHSABS 2.3 12/26/2020 1441   MONOABS 1.0 10/23/2021 1754   EOSABS 0.2 10/23/2021 1754   EOSABS 0.7 (H) 12/26/2020 1441   BASOSABS 0.1 10/23/2021 1754   BASOSABS 0.1 12/26/2020 1441   BMP Latest Ref Rng & Units 10/27/2021 10/26/2021 10/25/2021  Glucose 70 - 99 mg/dL 114(H) 95 90  BUN 8 - 23 mg/dL _0 Creatinine 0.44 - 1.00 mg/dL 0.62 0.69 0.69  BUN/Creat Ratio 12 - 28 - - -  Sodium 135 - 145 mmol/L 132(L) 128(L) 127(L)  Potassium 3.5 - 5.1 mmol/L 4.0 3.8 3.9  Chloride 98 - 111 mmol/L 96(L) 99 95(L)  CO2 22 - 32 mmol/L _1 Calcium 8.9 - 10.3 mg/dL 8.6(L) 8.2(L) 8.0(L)   Chest imaging: CXR 10/24/21 Status post right thoracentesis with diminished volume of a right pleural effusion and significant improvement in aeration of the lower right lung. There remains a large, dense, masslike consolidation of the right upper lobe. Emphysema of the left lung. Heart and mediastinum are unremarkable.  CTA Chest 10/23/21 1. No evidence of a pulmonary embolism. 2. Large right pleural effusion with near complete atelectasis/collapse the right lung. 3. Distal right main bronchus is occluded. This  is most likely due to mucous plugging. Endobronchial neoplasm is not excluded, however. Follow-up bronchoscopy is recommended. 4. Trace left pleural effusion with mild dependent left lower lobe and left upper lobe lingula atelectasis. 5. No evidence of pulmonary edema. 6. Advanced emphysema.  CT Chest 10/01/21 1. Complete opacification of the right upper lobe  with a heterogeneous masslike consolidation. Interval development of a moderate volume right pleural effusion. 2. Mediastinal lymphadenopathy. 3. Trace left pleural effusion. 4. Aortic Atherosclerosis (ICD10-I70.0) and Emphysema  CXR 10/01/21 Stable cardiomediastinal silhouette. Significant increased right upper lobe opacity is noted concerning for worsening pneumonia. Left lung is clear. Right basilar atelectasis and effusion may be present. Bony thorax is unremarkable.  CXR 09/01/21 1. New extensive consolidation in the lateral right midlung and lung base, concerning for pneumonia. Followup PA and lateral chest X-ray is recommended in 3-4 weeks following trial of antibiotic therapy to ensure resolution and exclude underlying malignancy. 2. Increased conspicuity of right greater than left interstitial opacities, which probably represents infection given above findings. Asymmetric edema is a consideration. 3. Small right pleural effusion. 4. Chronic hyperexpansion, compatible with emphysema.  CXR 05/27/21 Cardiomegaly. Bilateral interstitial prominence, right side greater than left. Findings suggest interstitial edema. Pneumonitis can not be excluded. Small right pleural effusion.  CXR 03/07/21 1. No acute cardiopulmonary disease. 2. Advanced COPD. PFT: No flowsheet data found.  Labs:  Path:  Echo 09/05/21: LV EF 50%. RV systolic function mildly reduced. RV size moderately to severely enlarged. RVSP 52.65mHg.   Heart Catheterization:       Assessment & Plan:   No diagnosis found.  Discussion: ***    Current Outpatient Medications:    acetaminophen (TYLENOL) 325 MG tablet, Take 325 mg by mouth every 6 (six) hours as needed for moderate pain., Disp: , Rfl:    albuterol (VENTOLIN HFA) 108 (90 Base) MCG/ACT inhaler, INHALE 1-2 PUFFS BY MOUTH EVERY 6 HOURS AS NEEDED FOR WHEEZE OR SHORTNESS OF BREATH (Patient taking differently: Inhale 2 puffs into the lungs See admin  instructions. Inhale 2 puffs into the lungs two times a day and every 6 hours as needed for COPD), Disp: 17 each, Rfl: 1   Amino Acids-Protein Hydrolys (PRO-STAT AWC) LIQD, Take 30 mLs by mouth 2 (two) times daily., Disp: , Rfl:    amiodarone (PACERONE) 200 MG tablet, TAKE 1 TABLET BY MOUTH EVERY DAY (Patient taking differently: Take 200 mg by mouth daily.), Disp: 30 tablet, Rfl: 0   bisacodyl (DULCOLAX) 10 MG suppository, Place 10 mg rectally daily as needed (for constipation)., Disp: , Rfl:    busPIRone (BUSPAR) 10 MG tablet, Take 1 tablet (10 mg total) by mouth daily., Disp: 90 tablet, Rfl: 2   clopidogrel (PLAVIX) 75 MG tablet, Take 1 tablet (75 mg total) by mouth daily., Disp: , Rfl:    docusate sodium (COLACE) 100 MG capsule, Take 1 capsule (100 mg total) by mouth 2 (two) times daily as needed for mild constipation. (Patient taking differently: Take 100 mg by mouth 2 (two) times daily.), Disp: 10 capsule, Rfl: 0   feeding supplement (ENSURE ENLIVE / ENSURE PLUS) LIQD, Take 237 mLs by mouth 2 (two) times daily between meals. (Patient not taking: Reported on 10/23/2021), Disp: , Rfl:    ferrous sulfate 324 MG TBEC, Take 324 mg by mouth 2 (two) times daily with a meal., Disp: , Rfl:    fluticasone (FLONASE) 50 MCG/ACT nasal spray, Place 1 spray into both nostrils daily as needed for allergies or rhinitis., Disp: 48  mL, Rfl: 1   ipratropium-albuterol (DUONEB) 0.5-2.5 (3) MG/3ML SOLN, TAKE 3 MLS BY NEBULIZATION EVERY 6 (SIX) HOURS AS NEEDED (SHORTNESS OF BREATH/WHEEZING.). (Patient taking differently: Take 3 mLs by nebulization every 6 (six) hours as needed (shortness of breath or wheezing).), Disp: 360 mL, Rfl: 1   ketoconazole (NIZORAL) 2 % cream, Apply 1 application topically See admin instructions. Apply to rash on left hand once a day (1st digit), Disp: , Rfl:    magnesium oxide (MAG-OX) 400 MG tablet, Take 400 mg by mouth daily., Disp: , Rfl:    midodrine (PROAMATINE) 5 MG tablet, Take 1 tablet (5  mg total) by mouth 3 (three) times daily with meals., Disp: , Rfl:    mirtazapine (REMERON) 15 MG tablet, Take 1 tablet (15 mg total) by mouth at bedtime as needed (sleep). (Patient taking differently: Take 15 mg by mouth at bedtime.), Disp: 90 tablet, Rfl: 2   mometasone-formoterol (DULERA) 200-5 MCG/ACT AERO, Inhale 1 puff into the lungs in the morning and at bedtime., Disp: 13 each, Rfl: 2   Multiple Vitamin-Folic Acid TABS, Take 1 tablet by mouth daily with breakfast., Disp: , Rfl:    oxyCODONE-acetaminophen (PERCOCET/ROXICET) 5-325 MG tablet, Take 1 tablet by mouth every 4 (four) hours as needed for severe pain., Disp: 30 tablet, Rfl: 0   pantoprazole (PROTONIX) 40 MG tablet, Take 1 tablet (40 mg total) by mouth daily at 6 (six) AM. (Patient taking differently: Take 40 mg by mouth every other day.), Disp: , Rfl:    polyethylene glycol (MIRALAX / GLYCOLAX) 17 g packet, Take 17 g by mouth daily as needed for moderate constipation. (Patient taking differently: Take 17 g by mouth See admin instructions. Mix 17 grams of powder into 6 ounces of fluid and drink every morning AND once a day as needed for constipation), Disp: 14 each, Rfl: 0   traZODone (DESYREL) 50 MG tablet, Take 25 mg by mouth at bedtime as needed for sleep., Disp: , Rfl:    umeclidinium bromide (INCRUSE ELLIPTA) 62.5 MCG/INH AEPB, INHALE 1 PUFF BY MOUTH EVERY DAY (Patient taking differently: Inhale 1 puff into the lungs daily.), Disp: 30 each, Rfl: 4   vitamin C (ASCORBIC ACID) 500 MG tablet, Take 500 mg by mouth daily., Disp: , Rfl:    XARELTO 15 MG TABS tablet, TAKE 1 TABLET (15 MG TOTAL) BY MOUTH DAILY., Disp: 90 tablet, Rfl: 2

## 2022-01-08 ENCOUNTER — Institutional Professional Consult (permissible substitution): Payer: Medicare Other | Admitting: Pulmonary Disease

## 2022-01-15 ENCOUNTER — Other Ambulatory Visit: Payer: Self-pay

## 2022-01-15 ENCOUNTER — Ambulatory Visit (INDEPENDENT_AMBULATORY_CARE_PROVIDER_SITE_OTHER): Payer: Medicare Other | Admitting: Internal Medicine

## 2022-01-15 ENCOUNTER — Encounter: Payer: Self-pay | Admitting: Internal Medicine

## 2022-01-15 VITALS — BP 118/60 | HR 71

## 2022-01-15 DIAGNOSIS — Z9981 Dependence on supplemental oxygen: Secondary | ICD-10-CM

## 2022-01-15 DIAGNOSIS — J449 Chronic obstructive pulmonary disease, unspecified: Secondary | ICD-10-CM | POA: Diagnosis not present

## 2022-01-15 NOTE — Progress Notes (Signed)
Kristen [name]    [id number]    Jan 05, 1946  Primary Care Physician:Braswell, [name], MD  Referring Physician: Raymondo Band, MD [address] [place],  [place] [address] Reason for Consultation: pleural effusion Date of Consultation: 01/15/2022  Chief complaint:   Chief Complaint  Patient presents with   Follow-up     HPI:  Kristen [name] is a 76 y.o. 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  She was admitted in December for respiratory failure, CAP and pleural effusion, suspected parapneumonic. Thoracentesis done by IR. Exudate, cytology negative. Treated emprically for CAP.    She hasn't been home since December since before the hospital stay, but her hope is to eventually get back home.  She is a bed bound nursing home resident who does not want to undergo any aggressive or invasive testing or procedures. She came here with transport from [facility], but no one came with her.   She denies currently any issues with her breathing. She takes her dulera inhaler, incruse and albuterol inhalers.   Doesn't feel like fluid is building back up. Has lost weight unintentionally over the last few months. 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  60 pack year smoking history, quit when she was pregnant.   Social History   Occupational History    Comment: RETIRED  Tobacco Use   Smoking status: Former    Packs/day: 1.00    Years: 60.00    Pack years: 60.00    Types: Cigarettes    Quit date: 10/16/2019    Years since quitting: 2.2   Smokeless tobacco: Never   Tobacco comments:    2-3 per day   Vaping Use   Vaping Use: Never used  Substance and Sexual Activity   Alcohol use: Yes    Comment: occasionally   Drug use: Never   Sexual activity: Not on file    Relevant family history:  Family History  Problem  Relation Age of Onset   Diabetes Mother    Diabetes Mellitus II Mother    Diabetes Father    Diverticulitis Sister     Past Medical History:  Diagnosis Date   Cellulitis    Chronic venous stasis dermatitis of both lower extremities    Chronic problem that was being manged by PCP in [place]   COPD (chronic obstructive pulmonary disease) ([place])    Dyspnea    PAF (paroxysmal atrial fibrillation) ([place])    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 Gen:      No acute distress, thin frail in, wheel chair ENT:  on oxygen, no nasal polyps, mucus membranes moist Lungs:    diminished, no wheezes or crackles CV:         Regular rate and rhythm; no murmurs, rubs, or gallops.  No pedal edema Abd:      + bowel sounds; soft, non-tender; no distension MSK: no acute synovitis of DIP or PIP joints, no mechanics hands.  Skin:      Warm and dry; no rashes Neuro: normal speech, no focal facial asymmetry Psych: alert and oriented x3, normal mood  and affect   Data Reviewed/Medical Decision Making:  Independent interpretation of tests: Imaging:  Review of patient's chest xray and ultrasound images revealed right sided pleural effusion, emphysema on ct chest from dec 2022. The patient's images have been independently reviewed by me.    PFTs: None on file No flowsheet data found.  Labs: cytology negative x 2 on pleural fluid Pleural fluid labs suggest exudative  Immunization status:  Immunization History  Administered Date(s) Administered   Fluad Quad(high Dose 65+) 09/17/2021   Influenza,inj,Quad PF,6+ Mos 08/27/2020   Moderna Sars-Covid-2 Vaccination 12/17/2020, 02/24/2021   Td 04/23/2017     I reviewed prior external note(s) from hospital stay  I reviewed the result(s) of the labs  and imaging as noted above.   Assessment:  COPD on home oxygen 3LNC History of tobacco use Recurrent right sided pleural effusion  Plan/Recommendations: I will defer further testing given her fragile clinical state.  Continue dulera, incruse, prn albuterol. She is feeling relatively well these days. She will let me know if she feels fluid is building back up and I can remove in the office. Continue home oxygen WE discussed goals of care today. She would like to be a DNR and has made her wishes known to [facility].   We discussed disease management and progression at length today.   I spent 45 minutes in the care of this patient today including pre-charting, chart review, review of results, face-to-face care, coordination of care and communication with consultants etc.).  Return to Care: Return in about 6 months (around 07/15/2022).  Lenice Llamas, MD Pulmonary and [place]  CC: Raymondo Band, MD

## 2022-01-15 NOTE — Patient Instructions (Addendum)
Please schedule follow up scheduled with myself in 6 months.  If my schedule is not open yet, we will contact you with a reminder closer to that time. Please call [phone] if you haven't heard from [name] a month before.   Continue your incruse and dulera inhalers. Continue oxygen support. Continue albuterol inhaler as needed.   If pleural effusion or breathing worsens - make an appointment with me sooner and I can try to remove this fluid in the office so you don't have to go to the hospital.

## 2022-04-30 ENCOUNTER — Ambulatory Visit (INDEPENDENT_AMBULATORY_CARE_PROVIDER_SITE_OTHER): Payer: Medicare Other | Admitting: Podiatry

## 2022-04-30 ENCOUNTER — Ambulatory Visit (INDEPENDENT_AMBULATORY_CARE_PROVIDER_SITE_OTHER): Payer: Medicare Other

## 2022-04-30 ENCOUNTER — Telehealth: Payer: Self-pay | Admitting: Podiatry

## 2022-04-30 DIAGNOSIS — S90221A Contusion of right lesser toe(s) with damage to nail, initial encounter: Secondary | ICD-10-CM

## 2022-04-30 DIAGNOSIS — L97512 Non-pressure chronic ulcer of other part of right foot with fat layer exposed: Secondary | ICD-10-CM | POA: Diagnosis not present

## 2022-04-30 MED ORDER — DOXYCYCLINE HYCLATE 100 MG PO TABS: 100.0000 mg | ORAL_TABLET | Freq: Two times a day (BID) | ORAL | 0 refills | Status: DC

## 2022-04-30 NOTE — Telephone Encounter (Signed)
Kristen [name] called they can not read the instructions for wound care for this patient , they want to know what ointment they are to be using with the clean dry wraps ?  Please call

## 2022-04-30 NOTE — Progress Notes (Signed)
Subjective:   Patient ID: Kristen [name], female   DOB: 76 y.o.   MRN: [id number]   HPI 76 year old female presents the office today for concerns of right hallux ulceration, black toenail.  This about 6 weeks ago.  She said that she may have hit her toe part of her wheelchair.  She has had some bleeding to the toe.  Previously she did have some drainage.  Still some swelling.  She has bandages around her ankles.  She said that she has a history of venous disease and ulceration clipped with a Band-Aid on it for protection to help prevent ulcerations.   Review of Systems  All other systems reviewed and are negative.  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 Inhale 2 puffs into the lungs two times a day and every 6 hours as needed for COPD), Disp: 17 each, Rfl: 1   Amino Acids-Protein Hydrolys (PRO-STAT AWC) LIQD, Take 30 mLs by mouth 2 (two) times daily., Disp: , Rfl:    amiodarone (PACERONE) 200 MG tablet, TAKE 1 TABLET BY MOUTH EVERY DAY (Patient taking differently: Take 200 mg by mouth daily.), Disp: 30 tablet, Rfl: 0   bisacodyl (DULCOLAX) 10 MG suppository, Place 10 mg rectally daily  as needed (for constipation)., Disp: , Rfl:    busPIRone (BUSPAR) 10 MG tablet, Take 1 tablet (10 mg total) by mouth daily., Disp: 90 tablet, Rfl: 2   clopidogrel (PLAVIX) 75 MG tablet, Take 1 tablet (75 mg total) by mouth daily., Disp: , Rfl:    docusate sodium (COLACE) 100 MG capsule, Take 1 capsule (100 mg total) by mouth 2 (two) times daily as needed for mild constipation. (Patient taking differently: Take 100 mg by mouth 2 (two) times daily.), Disp: 10 capsule, Rfl: 0   doxycycline (VIBRA-TABS) 100 MG tablet, Take 1 tablet (100 mg total) by mouth 2 (two) times daily., Disp: 14 tablet, Rfl: 0   feeding supplement (ENSURE ENLIVE / ENSURE PLUS) LIQD, Take 237 mLs by mouth 2 (two) times daily between meals., Disp: , Rfl:    ferrous sulfate 324 MG TBEC, Take 324 mg by mouth 2 (two) times daily with a meal., Disp: , Rfl:    fluticasone (FLONASE) 50 MCG/ACT nasal spray, Place 1 spray into both nostrils daily as needed for allergies or rhinitis., Disp: 48 mL, Rfl: 1   ipratropium-albuterol (DUONEB) 0.5-2.5 (3) MG/3ML SOLN, TAKE 3 MLS BY NEBULIZATION EVERY 6 (SIX) HOURS AS NEEDED (SHORTNESS OF BREATH/WHEEZING.). (Patient taking differently: Take 3 mLs by nebulization every 6 (six) hours as needed (shortness of breath or wheezing).), Disp: 360 mL,  Rfl: 1   ketoconazole (NIZORAL) 2 % cream, Apply 1 application topically See admin instructions. Apply to rash on left hand once a day (1st digit), Disp: , Rfl:    magnesium oxide (MAG-OX) 400 MG tablet, Take 400 mg by mouth daily., Disp: , Rfl:    midodrine (PROAMATINE) 5 MG tablet, Take 1 tablet (5 mg total) by mouth 3 (three) times daily with meals., Disp: , Rfl:    mirtazapine (REMERON) 15 MG tablet, Take 1 tablet (15 mg total) by mouth at bedtime as needed (sleep). (Patient taking differently: Take 15 mg by mouth at bedtime.), Disp: 90 tablet, Rfl: 2   mometasone-formoterol (DULERA) 200-5 MCG/ACT AERO, Inhale 1 puff into the lungs in the morning and at  bedtime., Disp: 13 each, Rfl: 2   Multiple Vitamin-Folic Acid TABS, Take 1 tablet by mouth daily with breakfast., Disp: , Rfl:    oxyCODONE-acetaminophen (PERCOCET/ROXICET) 5-325 MG tablet, Take 1 tablet by mouth every 4 (four) hours as needed for severe pain., Disp: 30 tablet, Rfl: 0   pantoprazole (PROTONIX) 40 MG tablet, Take 1 tablet (40 mg total) by mouth daily at 6 (six) AM. (Patient taking differently: Take 40 mg by mouth every other day.), Disp: , Rfl:    polyethylene glycol (MIRALAX / GLYCOLAX) 17 g packet, Take 17 g by mouth daily as needed for moderate constipation. (Patient taking differently: Take 17 g by mouth See admin instructions. Mix 17 grams of powder into 6 ounces of fluid and drink every morning AND once a day as needed for constipation), Disp: 14 each, Rfl: 0   traZODone (DESYREL) 50 MG tablet, Take 25 mg by mouth at bedtime as needed for sleep., Disp: , Rfl:    umeclidinium bromide (INCRUSE ELLIPTA) 62.5 MCG/INH AEPB, INHALE 1 PUFF BY MOUTH EVERY DAY (Patient taking differently: Inhale 1 puff into the lungs daily.), Disp: 30 each, Rfl: 4   vitamin C (ASCORBIC ACID) 500 MG tablet, Take 500 mg by mouth daily., Disp: , Rfl:    XARELTO 15 MG TABS tablet, TAKE 1 TABLET (15 MG TOTAL) BY MOUTH DAILY., Disp: 90 tablet, Rfl: 2  Allergies  Allergen Reactions   Gabapentin Other (See Comments)    Hallucination/ nightmares- "Allergic," per MAR          Objective:  Physical Exam  General: AAO x3, NAD  Dermatological: Right hallux nail is dystrophic and has dried blood under the entire toenail but is in the nails is firmly adhered to the underlying nailbed.  Incurvation present on the medial nail border.  On the medial hallux away from the toenails a superficial wound present and has some bleeding.  There is no probing, undermining or tunneling.  There is no fluctuance or crepitation.  No odor.  No significant cellulitis present.  Vascular: Dorsalis Pedis artery and Posterior Tibial  artery pedal pulses are palpable but somewhat decreased bilateral with immedate capillary fill time. There is no pain with calf compression, swelling, warmth, erythema.   Neruologic: Grossly intact via light touch bilateral.   Musculoskeletal: No significant pain.     Assessment:   76 year old female with ulceration, superficial, right hallux; subungual hematoma toenail     Plan:  -Treatment options discussed including all alternatives, risks, and complications -Etiology of symptoms were discussed -I discussed with her total nail removal within the amount of blood under the toenail.  However after I discussed this with her she was concerned that the only have 1 wound care nurse and they do not  come every day she did not feel like she would be able to keep it clean.  Due to this I did not remove the toenail.  I did sharply debride the nail, corners without any complications or bleeding.  There is currently no drainage or pus coming from the toenail site.  There is a superficial wound present on medial hallux with a granular base.  Recommended antibiotic ointment dressing changes daily.  Prescribed doxycycline.  Recommended x-rays that she can get done at the facility apparently. -Monitor for any clinical signs or symptoms of infection and directed to call the office immediately should any occur or go to the ER.  Return in about 2 weeks (around 05/14/2022).  Trula Slade DPM

## 2022-05-01 NOTE — Telephone Encounter (Signed)
[place] 2x -was transferred to nurse station 3x no one ever picked up ,will try to call back later this morning.

## 2022-05-01 NOTE — Telephone Encounter (Signed)
Spoke with [place] at [facility]  gave instruction , very appreciative of the call back.

## 2022-05-06 ENCOUNTER — Encounter: Payer: Self-pay | Admitting: Podiatry

## 2022-05-06 NOTE — Progress Notes (Signed)
I received x-rays, no evidence of fracture or osteomyelitis.

## 2022-05-19 ENCOUNTER — Ambulatory Visit (INDEPENDENT_AMBULATORY_CARE_PROVIDER_SITE_OTHER): Payer: Medicare Other | Admitting: Podiatry

## 2022-05-19 DIAGNOSIS — S90221A Contusion of right lesser toe(s) with damage to nail, initial encounter: Secondary | ICD-10-CM | POA: Diagnosis not present

## 2022-05-19 DIAGNOSIS — L97512 Non-pressure chronic ulcer of other part of right foot with fat layer exposed: Secondary | ICD-10-CM

## 2022-05-19 DIAGNOSIS — L600 Ingrowing nail: Secondary | ICD-10-CM | POA: Diagnosis not present

## 2022-05-19 MED ORDER — DOXYCYCLINE HYCLATE 100 MG PO TABS: 100.0000 mg | ORAL_TABLET | Freq: Two times a day (BID) | ORAL | 0 refills | Status: DC

## 2022-05-19 NOTE — Progress Notes (Signed)
Subjective: 76 year old female presents the office today for follow-up evaluation of wound on the right hallux as well as for left toenail discoloration.  She has noticed some bleeding on the big toenail.  No pus.  No fevers or chills.  No other concerns.  Objective: AAO x3, NAD DP/PT pulses palpable bilaterally, CRT less than 3 seconds Right hallux toenail is actively bleeding and the nail is loose nailbed distally but adhered proximally similar discharge resolved.  Incurvation of the nail borders.  Mild edema to the toenail there is no ascending cellulitis.  There is no purulence. No pain with calf compression, swelling, warmth, erythema  Assessment: Right hallux longus dystrophy, ingrown toenail with active bleeding  Plan: -All treatment options discussed with the patient including all alternatives, risks, complications.  -Given the nail is already bleeding and the nail is loose with underlying nailbed I do recommend to go ahead and have the nail removed.  She is also on blood thinners which is contributing to this.  Discussed risk complications of the nail removal and she agrees with this and wishes to proceed. -At this time, recommended total nail removal without chemical matricectomy to the right hallux nail risks and complications were discussed with the patient for which they understand and  verbally consent to the procedure. Under sterile conditions a total of 3 mL of a mixture of 2% lidocaine plain and 0.5% Marcaine plain was infiltrated in a hallux block fashion. Once anesthetized, the skin was prepped in sterile fashion. A tourniquet was then applied. Next the right hallux nail removed in total making sure remove all nail borders.  Once the nail was removed, the area was debrided and the underlying skin was intact. The area was irrigated and hemostasis was obtained.  A dry sterile dressing was applied. After application of the dressing the tourniquet was removed and there is found to be an  immediate capillary refill time to the digit. The patient tolerated the procedure well any complications. Post procedure instructions were discussed the patient for which he verbally understood.  Discussed signs/symptoms of worsening infection and directed to call the office immediately should any occur or go directly to the emergency room. In the meantime, encouraged to call the office with any questions, concerns, changes symptoms. -There was some bleeding after the bandage was applied and this was changed. We made sure there was no further bleeding before she left the office. -Patient encouraged to call the office with any questions, concerns, change in symptoms.   Trula Slade DPM

## 2022-06-11 ENCOUNTER — Ambulatory Visit: Payer: Medicare Other | Admitting: Podiatry

## 2023-09-15 ENCOUNTER — Inpatient Hospital Stay (HOSPITAL_COMMUNITY)
Admission: EM | Admit: 2023-09-15 | Discharge: 2023-09-19 | DRG: 871 | Disposition: A | Discharge status: Hospice | Payer: Medicare Other | Attending: Family Medicine | Admitting: Family Medicine

## 2023-09-15 ENCOUNTER — Emergency Department (HOSPITAL_COMMUNITY): Payer: Medicare Other

## 2023-09-15 ENCOUNTER — Encounter (HOSPITAL_COMMUNITY): Payer: Self-pay

## 2023-09-15 ENCOUNTER — Other Ambulatory Visit: Payer: Self-pay

## 2023-09-15 DIAGNOSIS — J9621 Acute and chronic respiratory failure with hypoxia: Secondary | ICD-10-CM | POA: Diagnosis present

## 2023-09-15 DIAGNOSIS — Z7951 Long term (current) use of inhaled steroids: Secondary | ICD-10-CM | POA: Diagnosis not present

## 2023-09-15 DIAGNOSIS — Z888 Allergy status to other drugs, medicaments and biological substances status: Secondary | ICD-10-CM

## 2023-09-15 DIAGNOSIS — J189 Pneumonia, unspecified organism: Secondary | ICD-10-CM | POA: Insufficient documentation

## 2023-09-15 DIAGNOSIS — J9601 Acute respiratory failure with hypoxia: Principal | ICD-10-CM

## 2023-09-15 DIAGNOSIS — A419 Sepsis, unspecified organism: Principal | ICD-10-CM | POA: Diagnosis present

## 2023-09-15 DIAGNOSIS — G9340 Encephalopathy, unspecified: Secondary | ICD-10-CM | POA: Diagnosis not present

## 2023-09-15 DIAGNOSIS — R579 Shock, unspecified: Principal | ICD-10-CM | POA: Diagnosis present

## 2023-09-15 DIAGNOSIS — I21A1 Myocardial infarction type 2: Secondary | ICD-10-CM | POA: Diagnosis present

## 2023-09-15 DIAGNOSIS — R52 Pain, unspecified: Secondary | ICD-10-CM | POA: Diagnosis not present

## 2023-09-15 DIAGNOSIS — M25429 Effusion, unspecified elbow: Secondary | ICD-10-CM | POA: Diagnosis present

## 2023-09-15 DIAGNOSIS — R6521 Severe sepsis with septic shock: Secondary | ICD-10-CM | POA: Diagnosis present

## 2023-09-15 DIAGNOSIS — Z87891 Personal history of nicotine dependence: Secondary | ICD-10-CM | POA: Diagnosis not present

## 2023-09-15 DIAGNOSIS — Z1152 Encounter for screening for COVID-19: Secondary | ICD-10-CM | POA: Diagnosis not present

## 2023-09-15 DIAGNOSIS — Z79899 Other long term (current) drug therapy: Secondary | ICD-10-CM | POA: Diagnosis not present

## 2023-09-15 DIAGNOSIS — I480 Paroxysmal atrial fibrillation: Secondary | ICD-10-CM | POA: Diagnosis present

## 2023-09-15 DIAGNOSIS — G9341 Metabolic encephalopathy: Secondary | ICD-10-CM | POA: Diagnosis present

## 2023-09-15 DIAGNOSIS — Z7902 Long term (current) use of antithrombotics/antiplatelets: Secondary | ICD-10-CM | POA: Diagnosis not present

## 2023-09-15 DIAGNOSIS — Z79890 Hormone replacement therapy (postmenopausal): Secondary | ICD-10-CM | POA: Diagnosis not present

## 2023-09-15 DIAGNOSIS — Z515 Encounter for palliative care: Secondary | ICD-10-CM

## 2023-09-15 DIAGNOSIS — Z66 Do not resuscitate: Secondary | ICD-10-CM | POA: Diagnosis present

## 2023-09-15 DIAGNOSIS — Z833 Family history of diabetes mellitus: Secondary | ICD-10-CM | POA: Diagnosis not present

## 2023-09-15 DIAGNOSIS — N390 Urinary tract infection, site not specified: Secondary | ICD-10-CM | POA: Diagnosis present

## 2023-09-15 DIAGNOSIS — R531 Weakness: Secondary | ICD-10-CM | POA: Diagnosis not present

## 2023-09-15 DIAGNOSIS — J440 Chronic obstructive pulmonary disease with acute lower respiratory infection: Secondary | ICD-10-CM | POA: Diagnosis present

## 2023-09-15 DIAGNOSIS — I872 Venous insufficiency (chronic) (peripheral): Secondary | ICD-10-CM | POA: Diagnosis present

## 2023-09-15 DIAGNOSIS — Z7901 Long term (current) use of anticoagulants: Secondary | ICD-10-CM | POA: Diagnosis not present

## 2023-09-15 DIAGNOSIS — Z7952 Long term (current) use of systemic steroids: Secondary | ICD-10-CM

## 2023-09-15 DIAGNOSIS — R4182 Altered mental status, unspecified: Secondary | ICD-10-CM | POA: Diagnosis present

## 2023-09-15 DIAGNOSIS — D649 Anemia, unspecified: Secondary | ICD-10-CM | POA: Diagnosis present

## 2023-09-15 LAB — CBC WITH DIFFERENTIAL/PLATELET
Abs Immature Granulocytes: 0.31 10*3/uL — ABNORMAL HIGH (ref 0.00–0.07)
Basophils Absolute: 0 10*3/uL (ref 0.0–0.1)
Basophils Relative: 0 %
Eosinophils Absolute: 0 10*3/uL (ref 0.0–0.5)
Eosinophils Relative: 0 %
HCT: 30.4 % — ABNORMAL LOW (ref 36.0–46.0)
Hemoglobin: 8.7 g/dL — ABNORMAL LOW (ref 12.0–15.0)
Immature Granulocytes: 2 %
Lymphocytes Relative: 10 %
Lymphs Abs: 1.5 10*3/uL (ref 0.7–4.0)
MCH: 30.2 pg (ref 26.0–34.0)
MCHC: 28.6 g/dL — ABNORMAL LOW (ref 30.0–36.0)
MCV: 105.6 fL — ABNORMAL HIGH (ref 80.0–100.0)
Monocytes Absolute: 0.8 10*3/uL (ref 0.1–1.0)
Monocytes Relative: 5 %
Neutro Abs: 11.9 10*3/uL — ABNORMAL HIGH (ref 1.7–7.7)
Neutrophils Relative %: 83 %
Platelets: 445 10*3/uL — ABNORMAL HIGH (ref 150–400)
RBC: 2.88 MIL/uL — ABNORMAL LOW (ref 3.87–5.11)
RDW: 15.6 % — ABNORMAL HIGH (ref 11.5–15.5)
WBC: 14.4 10*3/uL — ABNORMAL HIGH (ref 4.0–10.5)
nRBC: 0.1 % (ref 0.0–0.2)

## 2023-09-15 LAB — I-STAT VENOUS BLOOD GAS, ED
Acid-base deficit: 10 mmol/L — ABNORMAL HIGH (ref 0.0–2.0)
Bicarbonate: 14.1 mmol/L — ABNORMAL LOW (ref 20.0–28.0)
Calcium, Ion: 0.99 mmol/L — ABNORMAL LOW (ref 1.15–1.40)
HCT: 30 % — ABNORMAL LOW (ref 36.0–46.0)
Hemoglobin: 10.2 g/dL — ABNORMAL LOW (ref 12.0–15.0)
O2 Saturation: 92 %
Potassium: 3.7 mmol/L (ref 3.5–5.1)
Sodium: 140 mmol/L (ref 135–145)
TCO2: 15 mmol/L — ABNORMAL LOW (ref 22–32)
pCO2, Ven: 26.6 mm[Hg] — ABNORMAL LOW (ref 44–60)
pH, Ven: 7.333 (ref 7.25–7.43)
pO2, Ven: 67 mm[Hg] — ABNORMAL HIGH (ref 32–45)

## 2023-09-15 LAB — I-STAT CHEM 8, ED
BUN: 15 mg/dL (ref 8–23)
Calcium, Ion: 0.99 mmol/L — ABNORMAL LOW (ref 1.15–1.40)
Chloride: 110 mmol/L (ref 98–111)
Creatinine, Ser: 0.8 mg/dL (ref 0.44–1.00)
Glucose, Bld: 116 mg/dL — ABNORMAL HIGH (ref 70–99)
HCT: 30 % — ABNORMAL LOW (ref 36.0–46.0)
Hemoglobin: 10.2 g/dL — ABNORMAL LOW (ref 12.0–15.0)
Potassium: 3.7 mmol/L (ref 3.5–5.1)
Sodium: 140 mmol/L (ref 135–145)
TCO2: 15 mmol/L — ABNORMAL LOW (ref 22–32)

## 2023-09-15 LAB — CBG MONITORING, ED: Glucose-Capillary: 119 mg/dL — ABNORMAL HIGH (ref 70–99)

## 2023-09-15 LAB — POC OCCULT BLOOD, ED: Fecal Occult Bld: NEGATIVE

## 2023-09-15 LAB — I-STAT CG4 LACTIC ACID, ED
Lactic Acid, Venous: 6.5 mmol/L (ref 0.5–1.9)
Lactic Acid, Venous: 6.7 mmol/L (ref 0.5–1.9)

## 2023-09-15 LAB — TYPE AND SCREEN
ABO/RH(D): O NEG
Antibody Screen: NEGATIVE

## 2023-09-15 LAB — COMPREHENSIVE METABOLIC PANEL
ALT: 28 U/L (ref 0–44)
AST: 51 U/L — ABNORMAL HIGH (ref 15–41)
Albumin: <1.5 g/dL — ABNORMAL LOW (ref 3.5–5.0)
Alkaline Phosphatase: 239 U/L — ABNORMAL HIGH (ref 38–126)
Anion gap: 15 (ref 5–15)
BUN: 14 mg/dL (ref 8–23)
CO2: 15 mmol/L — ABNORMAL LOW (ref 22–32)
Calcium: 7.7 mg/dL — ABNORMAL LOW (ref 8.9–10.3)
Chloride: 110 mmol/L (ref 98–111)
Creatinine, Ser: 1.13 mg/dL — ABNORMAL HIGH (ref 0.44–1.00)
GFR, Estimated: 50 mL/min — ABNORMAL LOW (ref >60)
Glucose, Bld: 118 mg/dL — ABNORMAL HIGH (ref 70–99)
Potassium: 3.7 mmol/L (ref 3.5–5.1)
Sodium: 140 mmol/L (ref 135–145)
Total Bilirubin: 0.8 mg/dL (ref 0.3–1.2)
Total Protein: 4.5 g/dL — ABNORMAL LOW (ref 6.5–8.1)

## 2023-09-15 LAB — TROPONIN I (HIGH SENSITIVITY): Troponin I (High Sensitivity): 29 ng/L — ABNORMAL HIGH (ref <18)

## 2023-09-15 LAB — PROTIME-INR
INR: 1.3 — ABNORMAL HIGH (ref 0.8–1.2)
Prothrombin Time: 15.9 s — ABNORMAL HIGH (ref 11.4–15.2)

## 2023-09-15 LAB — AMMONIA: Ammonia: 73 umol/L — ABNORMAL HIGH (ref 9–35)

## 2023-09-15 LAB — MAGNESIUM: Magnesium: 1.9 mg/dL (ref 1.7–2.4)

## 2023-09-15 MED ORDER — NOREPINEPHRINE 4 MG/250ML-% IV SOLN
2.0000 ug/min | INTRAVENOUS | Status: DC
  Administered 2023-09-16: 5 ug/min via INTRAVENOUS
  Administered 2023-09-16: 9 ug/min via INTRAVENOUS
  Filled 2023-09-15: qty 250

## 2023-09-15 MED ORDER — LACTATED RINGERS IV BOLUS
1000.0000 mL | Freq: Once | INTRAVENOUS | Status: AC
  Administered 2023-09-15: 1000 mL via INTRAVENOUS

## 2023-09-15 MED ORDER — LACTATED RINGERS IV BOLUS (SEPSIS)
250.0000 mL | Freq: Once | INTRAVENOUS | Status: AC
  Administered 2023-09-15: 250 mL via INTRAVENOUS

## 2023-09-15 MED ORDER — NOREPINEPHRINE 4 MG/250ML-% IV SOLN
2.0000 ug/min | INTRAVENOUS | Status: DC
  Administered 2023-09-15: 2 ug/min via INTRAVENOUS
  Filled 2023-09-15: qty 250

## 2023-09-15 MED ORDER — SODIUM CHLORIDE 0.9 % IV SOLN
500.0000 mg | INTRAVENOUS | Status: DC
  Administered 2023-09-15: 500 mg via INTRAVENOUS
  Filled 2023-09-15: qty 5

## 2023-09-15 MED ORDER — PANTOPRAZOLE SODIUM 40 MG IV SOLR
40.0000 mg | Freq: Once | INTRAVENOUS | Status: AC
  Administered 2023-09-15: 40 mg via INTRAVENOUS
  Filled 2023-09-15: qty 10

## 2023-09-15 MED ORDER — DOCUSATE SODIUM 100 MG PO CAPS: 100.0000 mg | ORAL_CAPSULE | Freq: Two times a day (BID) | ORAL | Status: DC | PRN

## 2023-09-15 MED ORDER — LACTATED RINGERS IV SOLN
INTRAVENOUS | Status: DC
  Administered 2023-09-15: 1000 mL via INTRAVENOUS

## 2023-09-15 MED ORDER — SODIUM CHLORIDE 0.9 % IV SOLN: 250.0000 mL | INTRAVENOUS | Status: DC

## 2023-09-15 MED ORDER — SODIUM CHLORIDE 0.9 % IV SOLN
2.0000 g | INTRAVENOUS | Status: DC
  Administered 2023-09-15: 2 g via INTRAVENOUS
  Filled 2023-09-15: qty 20

## 2023-09-15 MED ORDER — POLYETHYLENE GLYCOL 3350 17 G PO PACK: 17.0000 g | PACK | Freq: Every day | ORAL | Status: DC | PRN

## 2023-09-15 MED ORDER — INSULIN ASPART 100 UNIT/ML IJ SOLN: 0.0000 [IU] | INTRAMUSCULAR | Status: DC

## 2023-09-15 MED ORDER — SODIUM CHLORIDE 0.9 % IV BOLUS
1000.0000 mL | Freq: Once | INTRAVENOUS | Status: AC
  Administered 2023-09-16: 1000 mL via INTRAVENOUS

## 2023-09-15 MED ORDER — SODIUM CHLORIDE 0.9 % IV SOLN
250.0000 mL | INTRAVENOUS | Status: DC
Start: 2023-09-16 — End: 2023-09-16
  Administered 2023-09-16: 250 mL via INTRAVENOUS

## 2023-09-15 NOTE — ED Notes (Signed)
[phone] pt sister called for a update.

## 2023-09-15 NOTE — Progress Notes (Addendum)
Elink monitoring for the code sepsis protocol.  2058  Notified provider of need to order repeat lactic acid. Orders placed.

## 2023-09-15 NOTE — ED Provider Notes (Signed)
Kristen [name] EMERGENCY DEPARTMENT AT [facility] Provider Note   CSN: [id number] Arrival date & time: 09/15/23  1823     History {Add pertinent medical, surgical, social history, OB history to HPI:1} Chief Complaint  Patient presents with   Altered Mental Status    Kristen [name] is a 77 y.o. female.  Brought in by EMS for altered mental status from her facility.  Last known well was around noon.  Staff found her altered and called EMS.  She was hypoxic and placed on nonrebreather.  She is intermittently pulling the nonrebreather off.  It sounds like at baseline she is conversant.  She does have a DNR in place.  She is not able to participate in exam.  The history is provided by the EMS personnel.  Altered Mental Status Presenting symptoms: partial responsiveness   Most recent episode:  Today Episode history:  Continuous Chronicity:  New Context: nursing home resident        Home Medications Prior to Admission medications   Medication Sig Start Date End Date Taking? Authorizing Provider  acetaminophen (TYLENOL) 325 MG tablet Take 325 mg by mouth every 6 (six) hours as needed for moderate pain.    [provider]  albuterol (VENTOLIN HFA) 108 (90 Base) MCG/ACT inhaler INHALE 1-2 PUFFS BY MOUTH EVERY 6 HOURS AS NEEDED FOR WHEEZE OR SHORTNESS OF BREATH Patient taking differently: Inhale 2 puffs into the lungs See admin instructions. Inhale 2 puffs into the lungs two times a day and every 6 hours as needed for COPD 09/30/21   Evlyn Kanner, MD  Amino Acids-Protein Hydrolys (PRO-STAT AWC) LIQD Take 30 mLs by mouth 2 (two) times daily.    [provider]  amiodarone (PACERONE) 200 MG tablet TAKE 1 TABLET BY MOUTH EVERY DAY Patient taking differently: Take 200 mg by mouth daily. 08/05/21   Evlyn Kanner, MD  bisacodyl (DULCOLAX) 10 MG suppository Place 10 mg rectally daily as needed (for constipation).    [provider]  busPIRone (BUSPAR) 10 MG  tablet Take 1 tablet (10 mg total) by mouth daily. 07/03/21   Belva Agee, MD  clopidogrel (PLAVIX) 75 MG tablet Take 1 tablet (75 mg total) by mouth daily. 09/18/21   Narda Bonds, MD  docusate sodium (COLACE) 100 MG capsule Take 1 capsule (100 mg total) by mouth 2 (two) times daily as needed for mild constipation. Patient taking differently: Take 100 mg by mouth 2 (two) times daily. 06/03/21   Dolan Amen, MD  doxycycline (VIBRA-TABS) 100 MG tablet Take 1 tablet (100 mg total) by mouth 2 (two) times daily. 05/19/22   Vivi Barrack, DPM  feeding supplement (ENSURE ENLIVE / ENSURE PLUS) LIQD Take 237 mLs by mouth 2 (two) times daily between meals. 09/17/21   Narda Bonds, MD  ferrous sulfate 324 MG TBEC Take 324 mg by mouth 2 (two) times daily with a meal.    [provider]  fluticasone (FLONASE) 50 MCG/ACT nasal spray Place 1 spray into both nostrils daily as needed for allergies or rhinitis. 03/31/21   Katsadouros, Vasilios, MD  ipratropium-albuterol (DUONEB) 0.5-2.5 (3) MG/3ML SOLN TAKE 3 MLS BY NEBULIZATION EVERY 6 (SIX) HOURS AS NEEDED (SHORTNESS OF BREATH/WHEEZING.). Patient taking differently: Take 3 mLs by nebulization every 6 (six) hours as needed (shortness of breath or wheezing). 07/31/21 10/29/21  Evlyn Kanner, MD  ketoconazole (NIZORAL) 2 % cream Apply 1 application topically See admin instructions. Apply to rash on left hand once a day (1st  digit)    [provider]  magnesium oxide (MAG-OX) 400 MG tablet Take 400 mg by mouth daily.    [provider]  midodrine (PROAMATINE) 5 MG tablet Take 1 tablet (5 mg total) by mouth 3 (three) times daily with meals. 09/17/21   Narda Bonds, MD  mirtazapine (REMERON) 15 MG tablet Take 1 tablet (15 mg total) by mouth at bedtime as needed (sleep). Patient taking differently: Take 15 mg by mouth at bedtime. 03/18/21   Theotis Barrio, MD  mometasone-formoterol (DULERA) 200-5 MCG/ACT AERO Inhale 1 puff  into the lungs in the morning and at bedtime. 07/17/21   Dellia Cloud, MD  Multiple Vitamin-Folic Acid TABS Take 1 tablet by mouth daily with breakfast.    [provider]  oxyCODONE-acetaminophen (PERCOCET/ROXICET) 5-325 MG tablet Take 1 tablet by mouth every 4 (four) hours as needed for severe pain. 10/28/21   Alwyn Ren, MD  pantoprazole (PROTONIX) 40 MG tablet Take 1 tablet (40 mg total) by mouth daily at 6 (six) AM. Patient taking differently: Take 40 mg by mouth every other day. 09/18/21   Narda Bonds, MD  polyethylene glycol (MIRALAX / GLYCOLAX) 17 g packet Take 17 g by mouth daily as needed for moderate constipation. Patient taking differently: Take 17 g by mouth See admin instructions. Mix 17 grams of powder into 6 ounces of fluid and drink every morning AND once a day as needed for constipation 06/05/21   Dolan Amen, MD  traZODone (DESYREL) 50 MG tablet Take 25 mg by mouth at bedtime as needed for sleep.    [provider]  umeclidinium bromide (INCRUSE ELLIPTA) 62.5 MCG/INH AEPB INHALE 1 PUFF BY MOUTH EVERY DAY Patient taking differently: Inhale 1 puff into the lungs daily. 07/17/21   Dellia Cloud, MD  vitamin C (ASCORBIC ACID) 500 MG tablet Take 500 mg by mouth daily.    [provider]  XARELTO 15 MG TABS tablet TAKE 1 TABLET (15 MG TOTAL) BY MOUTH DAILY. 09/19/21   Evlyn Kanner, MD      Allergies    Gabapentin    Review of Systems   Review of Systems  Unable to perform ROS: Mental status change    Physical Exam Updated Vital Signs There were no vitals taken for this visit. Physical Exam Vitals and nursing note reviewed.  Constitutional:      General: She is not in acute distress.    Appearance: She is well-developed.  HENT:     Head: Normocephalic.     Comments: She has some dried blood and a small laceration on her chin Eyes:     Conjunctiva/sclera: Conjunctivae normal.  Cardiovascular:     Rate and Rhythm: Normal  rate and regular rhythm.     Heart sounds: No murmur heard. Pulmonary:     Effort: Pulmonary effort is normal. No respiratory distress.     Breath sounds: Normal breath sounds.  Abdominal:     Palpations: Abdomen is soft.     Tenderness: There is no abdominal tenderness. There is no guarding or rebound.  Musculoskeletal:        General: No deformity.     Cervical back: Neck supple.  Skin:    General: Skin is warm and dry.     Capillary Refill: Capillary refill takes less than 2 seconds.  Neurological:     General: No focal deficit present.     Comments: She is awake although not answering any questions.  She is  pulling at equipment.  She has good localization of her upper extremities and some movement of her lower extremities.  She is not able to participate in exam or follow commands.  She is not speaking.     ED Results / Procedures / Treatments   Labs (all labs ordered are listed, but only abnormal results are displayed) Labs Reviewed  CULTURE, BLOOD (ROUTINE X 2)  CULTURE, BLOOD (ROUTINE X 2)  COMPREHENSIVE METABOLIC PANEL  CBC WITH DIFFERENTIAL/PLATELET  URINALYSIS, ROUTINE W REFLEX MICROSCOPIC  AMMONIA  PROTIME-INR  CBG MONITORING, ED  I-STAT CG4 LACTIC ACID, ED  I-STAT CHEM 8, ED  I-STAT VENOUS BLOOD GAS, ED    EKG None  Radiology No results found.  Procedures Procedures  {Document cardiac monitor, telemetry assessment procedure when appropriate:1}  Medications Ordered in ED Medications - No data to display  ED Course/ Medical Decision Making/ A&P   {   Click here for ABCD2, HEART and other calculatorsREFRESH Note before signing :1}                              Medical Decision Making Amount and/or Complexity of Data Reviewed Labs: ordered. Radiology: ordered.   This patient complains of ***; this involves an extensive number of treatment Options and is a complaint that carries with it a high risk of complications and morbidity. The differential  includes ***  I ordered, reviewed and interpreted labs, which included *** I ordered medication *** and reviewed PMP when indicated. I ordered imaging studies which included *** and I independently    visualized and interpreted imaging which showed *** Additional history obtained from *** Previous records obtained and reviewed *** I consulted *** and discussed lab and imaging findings and discussed disposition.  Cardiac monitoring reviewed, *** Social determinants considered, *** Critical Interventions: ***  After the interventions stated above, I reevaluated the patient and found *** Admission and further testing considered, ***   {Document critical care time when appropriate:1} {Document review of labs and clinical decision tools ie heart score, Chads2Vasc2 etc:1}  {Document your independent review of radiology images, and any outside records:1} {Document your discussion with family members, caretakers, and with consultants:1} {Document social determinants of health affecting pt's care:1} {Document your decision making why or why not admission, treatments were needed:1} Final Clinical Impression(s) / ED Diagnoses Final diagnoses:  None    Rx / DC Orders ED Discharge Orders     None

## 2023-09-15 NOTE — ED Triage Notes (Signed)
Pt BIB [facility] from [facility] d/t AMS. Staff reports she was at baseline at 3pm today. Reports her spO2 was 70% on RA & NRB brought her up to the 80's. EMS reports they could not get a BP & there was a large amount of edema in her Lt arm & a smaller amount of edema on the Rt arm. EMS reports she has large pupils, does not follow commands & she has known wounds on her bil legs that are wrapped. Pt does have a DNR at bedside.

## 2023-09-15 NOTE — ED Notes (Signed)
Dark stools noted when taking rectal temp. Hemoccult collected. MD Charm Barges made aware.

## 2023-09-15 NOTE — H&P (Signed)
NAME:  Kristen [name], MRN:  [id number], DOB:  08/12/1946, LOS: 0 ADMISSION DATE:  09/15/2023, CONSULTATION DATE:  09/15/23 REFERRING MD:  Kayleen Memos. Charm Barges, CHIEF COMPLAINT:  Shock   History of Present Illness:  History obtained from the ED provider and the patient's electronic medical records given her altered mentation. 77 y.o. female transferred from her facility with altered mental status.  Patient was reportedly at her baseline around 3 PM today.  Patient was found to be hypoxic with altered mental status.  EMS reported her saturation was in the 70% range on room air on arrival.  Patient was initially on nonrebreather and then transitioned to nasal cannula oxygen.  Patient notably altered on arrival as well.  Family have called to receive update but I have been unable to reach her family by phone.  She does have a MOST/DNR form at bedside that was brought with her.  Patient was given bolus IV fluid on presentation and started on peripherally infuse Levophed for treatment of her shock post fluid resuscitation.  Patient's lactic acid was notably elevated on arrival as well.  Code sepsis was activated.  Patient was given empiric Rocephin and Zithromax.  ED provider subsequently consulted critical care for further treatment and admission.  Significant Hospital Events: Including procedures, antibiotic start and stop dates in addition to other pertinent events   10/23 - Admitted with Shock and Acute Encephalopathy  Interim History / Subjective:  N/A  Objective   Blood pressure (!) 90/54, pulse (!) 115, temperature 99 F (37.2 C), temperature source Rectal, resp. rate (!) 32, SpO2 100%.        Intake/Output Summary (Last 24 hours) at 09/15/2023 2347 Last data filed at 09/15/2023 2205 Gross per 24 hour  Intake 1579.34 ml  Output --  Net 1579.34 ml   There were no vitals filed for this visit.  Examination: General:  Awake. No acute distress.  Nurse at bedside at the time of my exam.  No  family present. Integument:  Warm & dry.  Bruising of various ages noted. Extremities:  No cyanosis or clubbing.  Lymphatics:  No appreciated cervical or supraclavicular lymphadenoapthy. HEENT:  Tacky mucus membranes. No oral ulcers. No scleral injection or icterus. Cardiovascular:  Regular rate. Mild edema noted bilaterally. No JVD appreciated. Pulmonary: Coarse breath sounds bilaterally.  Only mildly increased work of breathing. Symmetric chest wall expansion. Abdomen: Soft. Normoactive bowel sounds. Protuberant. Grossly nontender.  No voluntary guarding.   Musculoskeletal: Decreased muscle bulk with normal tone. No joint deformity or effusion appreciated. Neurological: Symmetric face. No meningismus. Moving all 4 extremities.  Seems to attend to voice intermittently.  Does not follow commands.  Pupils symmetric and reactive. Psychiatric: She does not answer questions or follow commands.  Resolved Hospital Problem list   N/A  Assessment & Plan:  77 y.o. female who currently resides in a skilled nursing facility presents with acute hypoxic respiratory failure and shock.  Patient remains in a state of shock despite fluid and volume resuscitation.  Patient did arrive with a MOST form documenting a DO NOT RESUSCITATE status.  I have attempted to contact family by phone repeatedly but unsuccessfully.  1.  Probable septic shock: Potential sources include pneumonia.  Continuing gentle IV fluid resuscitation.  Titrating Levophed to maintain mean arterial pressure greater than 65 through peripheral IV and plan to hold on placing a central venous catheter unless patient's vasopressor requirement increases.  She [place] serum cortisol and transthoracic echocardiogram.  Continuing to trend troponin I.  Blood cultures already obtained.  Checking UA with micro and reflex to culture.  2.  Multifocal/lower lobe pneumonia: Switching from Rocephin and azithromycin to doxycycline, vancomycin, and cefepime.   Checking RSV, influenza, and COVID-19 swabs.  Also checking respiratory panel PCR, urine streptococcal antigen, and urine Legionella antigen.  Check MRSA nasal PCR.  Patient will be on droplet isolation until test results.  3.  Acute encephalopathy: Unknown baseline neurologic status.  I have been unable to reach family.  Checking TSH, free T4, and B12.  Avoiding sedation and analgesia.  Maintaining normotension and also treating hypoxia.  Deferring MRI imaging of the brain for now.  4.  Acute hypoxic respiratory failure: FiO2 requirement is improving some with treatment of shock..  Secondary to multifocal pneumonia.  Continuous pulse oximetry monitoring ongoing.  Weaning FiO2 to maintain saturation greater than 90%.  5.  Lactic/metabolic acidosis: Likely secondary to septic shock.  Patient's pH is compensated.  Hold off on bicarb drip.  Continuing to trend lactic acid.  6.  Type II non-STEMI: Likely secondary to shock, acidosis, and hypoxia.  EKG with significant artifact.  Continuing to trend troponin I and checking transthoracic echocardiogram to evaluate for wall motion abnormalities.  Holding on systemic anticoagulation at this time.  7.  Anemia: No visible signs of active blood loss.  Continuing to trend hemoglobin daily with CBC.  Threshold for transfusion is hemoglobin less than 8.0 or evidence of active blood loss.  8.  COPD: Unclear severity.  Home medication regimen includes maintenance inhalers.  No clear evidence for acute exacerbation.  Holding on systemic steroids.  Starting patient on scheduled DuoNebs every 6 hours and Pulmicort 0.5 mg nebulized twice daily.  9.  Paroxysmal atrial fibrillation: Continuing telemetry monitoring.  Best Practice (right click and "Reselect all SmartList Selections" daily)   Diet/type: NPO DVT prophylaxis: SCD GI prophylaxis: N/A Lines: N/A Foley:  N/A Code Status:  DNR Last date of multidisciplinary goals of care discussion [N/A]  Labs    CBC: Recent Labs  Lab 09/15/23 1850 09/15/23 1859  WBC 14.4*  --   NEUTROABS 11.9*  --   HGB 8.7* 10.2*  10.2*  HCT 30.4* 30.0*  30.0*  MCV 105.6*  --   PLT 445*  --     Basic Metabolic Panel: Recent Labs  Lab 09/15/23 1850 09/15/23 1859  NA 140 140  140  K 3.7 3.7  3.7  CL 110 110  CO2 15*  --   GLUCOSE 118* 116*  BUN 14 15  CREATININE 1.13* 0.80  CALCIUM 7.7*  --   MG 1.9  --    GFR: CrCl cannot be calculated (Unknown ideal weight.). Recent Labs  Lab 09/15/23 1850 09/15/23 1900 09/15/23 2157  WBC 14.4*  --   --   LATICACIDVEN  --  6.5* 6.7*    Liver Function Tests: Recent Labs  Lab 09/15/23 1850  AST 51*  ALT 28  ALKPHOS 239*  BILITOT 0.8  PROT 4.5*  ALBUMIN <1.5*   No results for input(s): "LIPASE", "AMYLASE" in the last 168 hours. Recent Labs  Lab 09/15/23 1850  AMMONIA 73*    ABG    Component Value Date/Time   PHART 7.394 09/04/2021 0811   PCO2ART 44.3 09/04/2021 0811   PO2ART 58.5 (L) 09/04/2021 0811   HCO3 14.1 (L) 09/15/2023 1859   TCO2 15 (L) 09/15/2023 1859   TCO2 15 (L) 09/15/2023 1859   ACIDBASEDEF 10.0 (H) 09/15/2023 1859   O2SAT 92 09/15/2023 1859  Coagulation Profile: Recent Labs  Lab 09/15/23 1850  INR 1.3*    Cardiac Enzymes: No results for input(s): "CKTOTAL", "CKMB", "CKMBINDEX", "TROPONINI" in the last 168 hours.  HbA1C: Hgb A1c MFr Bld  Date/Time Value Ref Range Status  06/29/2020 04:27 PM 5.6 4.8 - 5.6 % Final    Comment:    (NOTE) Pre diabetes:          5.7%-6.4%  Diabetes:              >6.4%  Glycemic control for   <7.0% adults with diabetes     CBG: Recent Labs  Lab 09/15/23 1849  GLUCAP 119*    IMAGING: PORT CXR 09/15/23 (personally reviewed by me):  X-ray performed semierect.  Film also rotated slightly to the left.  Hazy bilateral lower lung opacities without focal area of consolidation.  Cannot exclude small pleural effusions.  Trachea midline overall.  Normal mediastinal  contour.  No pneumothorax.  CT HEAD W/O CONTRAST 09/15/23 (per radiologist): IMPRESSION: 1. No acute intracranial process. 2. Mild chronic microvascular ischemic changes and possible old lacunar infarct in the right cerebellar hemisphere. 3. Complete opacification of the mastoid air cells and middle ear on the right, possible effusion versus cholesteatoma.  Review of Systems:   Unable to obtain a review of systems given patient's altered mental status.   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   Surgical History:   Past Surgical History:  Procedure Laterality Date   IR THORACENTESIS ASP PLEURAL SPACE W/IMG GUIDE  10/03/2021   VASCULAR SURGERY       Social History:   reports that she quit smoking about 3 years ago. Her smoking use included cigarettes. She started smoking about 63 years ago. She has a 60 pack-year smoking history. She has never used smokeless tobacco. She reports current alcohol use. She reports that she does not use drugs.   Family History:  Her family history includes Diabetes in her father and mother; Diabetes Mellitus II in her mother; Diverticulitis in her sister.   Allergies Allergies  Allergen Reactions   Gabapentin Other (See Comments)    Hallucination/ nightmares- "Allergic," per [facility]     Home Medications  Prior to Admission medications   Medication Sig Start Date End Date Taking? Authorizing Provider  albuterol (VENTOLIN HFA) 108 (90 Base) MCG/ACT inhaler INHALE 1-2 PUFFS BY MOUTH EVERY 6 HOURS AS NEEDED FOR WHEEZE OR SHORTNESS OF BREATH Patient taking differently: Inhale 2 puffs into the lungs See admin instructions. Inhale 2 puffs into the lungs two times a day and every 6 hours as needed for COPD 09/30/21  Yes  Evlyn Kanner, MD  amiodarone (PACERONE) 200 MG tablet TAKE 1 TABLET BY MOUTH EVERY DAY Patient taking differently: Take 200 mg by mouth daily. 08/05/21  Yes Evlyn Kanner, MD  benzocaine-menthol (CHLORASEPTIC SORE THROAT) 6-10 MG lozenge Take 1 lozenge by mouth 4 (four) times daily as needed for sore throat.   Yes [provider]  budesonide-formoterol (SYMBICORT) 160-4.5 MCG/ACT inhaler Inhale 2 puffs into the lungs in the morning and at bedtime. 09/06/23  Yes [provider]  busPIRone (BUSPAR) 10 MG tablet Take 1 tablet (10 mg total) by mouth daily. Patient taking differently: Take 10 mg by mouth 3 (three) times daily. 07/03/21  Yes Belva Agee, MD  carbamide peroxide (DEBROX) 6.5 % OTIC solution Place 5 drops into both ears 2 (two) times daily.   Yes [provider]  Dextran 70-Hypromellose 0.1-0.3 % SOLN Apply 1 drop to eye in the morning, at noon, and at bedtime.   Yes [provider]  docusate sodium (COLACE) 100 MG capsule Take 1 capsule (100 mg total) by mouth 2 (two) times daily as needed for mild constipation. Patient taking differently: Take 100 mg by mouth 2 (two) times daily. 06/03/21  Yes Dolan Amen, MD  Emollient (EUCERIN) lotion Apply 1 Application topically in the morning and at bedtime.   Yes [provider]  feeding supplement (ENSURE ENLIVE / ENSURE PLUS) LIQD Take 237 mLs by mouth 2 (two) times daily between meals. 09/17/21  Yes Narda Bonds, MD  ferrous sulfate 324 MG TBEC Take 324 mg by mouth 2 (two) times daily with a meal.   Yes [provider]  fluticasone (FLONASE) 50 MCG/ACT nasal spray Place 1 spray into both nostrils daily as needed for allergies or rhinitis. 03/31/21  Yes Katsadouros, Vasilios, MD  hydrOXYzine (ATARAX) 50 MG tablet Take 50 mg by mouth 2 (two) times daily. 09/09/23  Yes [provider]  ipratropium (ATROVENT) 0.03 % nasal spray Place 1 spray into both nostrils daily.  06/07/23  Yes [provider]  levothyroxine (SYNTHROID) 100 MCG tablet Take 100 mcg by mouth daily. 09/14/23  Yes [provider]  magnesium oxide (MAG-OX) 400 MG tablet Take 400 mg by mouth daily.   Yes [provider]  midodrine (PROAMATINE) 5 MG tablet Take 1 tablet (5 mg total) by mouth 3 (three) times daily with meals. 09/17/21  Yes Narda Bonds, MD  mirtazapine (REMERON) 15 MG tablet Take 1 tablet (15 mg total) by mouth at bedtime as needed (sleep). Patient taking differently: Take 15 mg by mouth at bedtime. 03/18/21  Yes Theotis Barrio, MD  montelukast (SINGULAIR) 10 MG tablet Take 10 mg by mouth daily. 09/13/23  Yes [provider]  mupirocin ointment (BACTROBAN) 2 % Apply 1 Application topically daily. 09/01/23  Yes [provider]  [facility] powder Apply 1 Application topically daily as needed (apply to sacrum, under breasts, and buttocks as needed). 09/07/23  Yes [provider]  ondansetron (ZOFRAN-ODT) 4 MG disintegrating tablet Take 2 mg by mouth 2 (two) times daily as needed for nausea. 08/31/23  Yes [provider]  oxyCODONE-acetaminophen (PERCOCET/ROXICET) 5-325 MG tablet Take 1 tablet by mouth every 4 (four) hours as needed for severe pain. Patient taking differently: Take 1 tablet by mouth every 6 (six) hours as needed for moderate pain (pain score 4-6). 10/28/21  Yes Alwyn Ren, MD  pantoprazole (PROTONIX) 40 MG tablet Take 1 tablet (40 mg total) by mouth daily at 6 (six) AM. Patient taking differently: Take 40 mg by mouth every other day. 09/18/21  Yes Narda Bonds, MD  polyethylene glycol (MIRALAX / GLYCOLAX) 17 g packet Take 17 g by mouth daily as needed for moderate constipation. Patient taking differently: Take 17 g by mouth daily. Mix 17 grams of powder into 6 ounces of fluid and drink every morning 06/05/21  Yes Dolan Amen, MD  predniSONE (DELTASONE) 20 MG tablet Take 20 mg by mouth 2 (two) times  daily. 08/23/23  Yes [provider]  simethicone (MYLICON) 80 MG chewable tablet Chew 80 mg by mouth daily as needed for flatulence.   Yes [provider]  triamcinolone (KENALOG) 0.025 % ointment Apply  1 Application topically in the morning and at bedtime. 08/05/23  Yes [provider]  umeclidinium bromide (INCRUSE ELLIPTA) 62.5 MCG/INH AEPB INHALE 1 PUFF BY MOUTH EVERY DAY Patient taking differently: Inhale 1 puff into the lungs daily. 07/17/21  Yes Dellia Cloud, MD  Vitamin D, Ergocalciferol, (DRISDOL) 1.25 MG (50000 UNIT) CAPS capsule Take 50,000 Units by mouth once a week. Takes on Mondays 08/30/23  Yes [provider]  XARELTO 15 MG TABS tablet TAKE 1 TABLET (15 MG TOTAL) BY MOUTH DAILY. 09/19/21  Yes Evlyn Kanner, MD     Critical care time:  I have spent a total of 34 minutes of critical care time today managing the patient's ongoing shock and acute encephalopathy requiring titration of vasopressor support independent of time spent during procedures caring for the patient, discussing plan of care with patient's ED nurse at bedside, and reviewing the patient's electronic medical record.  The patient's prognosis remains guarded.  Her risk for further clinical deterioration and death due to her critical illness and comorbid medical conditions remains high.

## 2023-09-16 ENCOUNTER — Inpatient Hospital Stay (HOSPITAL_COMMUNITY): Payer: Medicare Other

## 2023-09-16 ENCOUNTER — Other Ambulatory Visit: Payer: Self-pay

## 2023-09-16 DIAGNOSIS — J9601 Acute respiratory failure with hypoxia: Secondary | ICD-10-CM

## 2023-09-16 DIAGNOSIS — Z515 Encounter for palliative care: Secondary | ICD-10-CM | POA: Diagnosis not present

## 2023-09-16 DIAGNOSIS — J189 Pneumonia, unspecified organism: Secondary | ICD-10-CM | POA: Diagnosis not present

## 2023-09-16 DIAGNOSIS — R52 Pain, unspecified: Secondary | ICD-10-CM

## 2023-09-16 DIAGNOSIS — R579 Shock, unspecified: Secondary | ICD-10-CM

## 2023-09-16 LAB — CORTISOL: Cortisol, Plasma: 53.4 ug/dL

## 2023-09-16 LAB — COMPREHENSIVE METABOLIC PANEL
ALT: 28 U/L (ref 0–44)
AST: 49 U/L — ABNORMAL HIGH (ref 15–41)
Albumin: <1.5 g/dL — ABNORMAL LOW (ref 3.5–5.0)
Alkaline Phosphatase: 211 U/L — ABNORMAL HIGH (ref 38–126)
Anion gap: 16 — ABNORMAL HIGH (ref 5–15)
BUN: 12 mg/dL (ref 8–23)
CO2: 13 mmol/L — ABNORMAL LOW (ref 22–32)
Calcium: 7.4 mg/dL — ABNORMAL LOW (ref 8.9–10.3)
Chloride: 110 mmol/L (ref 98–111)
Creatinine, Ser: 1.04 mg/dL — ABNORMAL HIGH (ref 0.44–1.00)
GFR, Estimated: 55 mL/min — ABNORMAL LOW (ref >60)
Glucose, Bld: 104 mg/dL — ABNORMAL HIGH (ref 70–99)
Potassium: 3.9 mmol/L (ref 3.5–5.1)
Sodium: 139 mmol/L (ref 135–145)
Total Bilirubin: 0.6 mg/dL (ref 0.3–1.2)
Total Protein: 3.9 g/dL — ABNORMAL LOW (ref 6.5–8.1)

## 2023-09-16 LAB — PROCALCITONIN: Procalcitonin: 0.85 ng/mL

## 2023-09-16 LAB — CBC WITH DIFFERENTIAL/PLATELET
Abs Immature Granulocytes: 0.38 10*3/uL — ABNORMAL HIGH (ref 0.00–0.07)
Basophils Absolute: 0 10*3/uL (ref 0.0–0.1)
Basophils Relative: 0 %
Eosinophils Absolute: 0 10*3/uL (ref 0.0–0.5)
Eosinophils Relative: 0 %
HCT: 26.5 % — ABNORMAL LOW (ref 36.0–46.0)
Hemoglobin: 8.4 g/dL — ABNORMAL LOW (ref 12.0–15.0)
Immature Granulocytes: 2 %
Lymphocytes Relative: 7 %
Lymphs Abs: 1.2 10*3/uL (ref 0.7–4.0)
MCH: 30.2 pg (ref 26.0–34.0)
MCHC: 31.7 g/dL (ref 30.0–36.0)
MCV: 95.3 fL (ref 80.0–100.0)
Monocytes Absolute: 0.6 10*3/uL (ref 0.1–1.0)
Monocytes Relative: 4 %
Neutro Abs: 13.7 10*3/uL — ABNORMAL HIGH (ref 1.7–7.7)
Neutrophils Relative %: 87 %
Platelets: 365 10*3/uL (ref 150–400)
RBC: 2.78 MIL/uL — ABNORMAL LOW (ref 3.87–5.11)
RDW: 15.5 % (ref 11.5–15.5)
WBC: 15.8 10*3/uL — ABNORMAL HIGH (ref 4.0–10.5)
nRBC: 0 % (ref 0.0–0.2)

## 2023-09-16 LAB — RESPIRATORY PANEL BY PCR

## 2023-09-16 LAB — URINALYSIS, ROUTINE W REFLEX MICROSCOPIC
Bilirubin Urine: NEGATIVE
Glucose, UA: NEGATIVE mg/dL
Ketones, ur: NEGATIVE mg/dL
Nitrite: NEGATIVE
Protein, ur: 30 mg/dL — AB
RBC / HPF: >50 RBC/hpf (ref 0–5)
Specific Gravity, Urine: 1.019 (ref 1.005–1.030)
WBC, UA: >50 WBC/hpf (ref 0–5)
pH: 5 (ref 5.0–8.0)

## 2023-09-16 LAB — RESP PANEL BY RT-PCR (RSV, FLU A&B, COVID)  RVPGX2
Influenza A by PCR: NEGATIVE
Influenza B by PCR: NEGATIVE
Resp Syncytial Virus by PCR: NEGATIVE
SARS Coronavirus 2 by RT PCR: NEGATIVE

## 2023-09-16 LAB — HEMOGLOBIN A1C
Hgb A1c MFr Bld: 3.9 % — ABNORMAL LOW (ref 4.8–5.6)
Mean Plasma Glucose: 65.23 mg/dL

## 2023-09-16 LAB — BRAIN NATRIURETIC PEPTIDE: B Natriuretic Peptide: 323.1 pg/mL — ABNORMAL HIGH (ref 0.0–100.0)

## 2023-09-16 LAB — MRSA NEXT GEN BY PCR, NASAL: MRSA by PCR Next Gen: DETECTED — AB

## 2023-09-16 LAB — GLUCOSE, CAPILLARY
Glucose-Capillary: 114 mg/dL — ABNORMAL HIGH (ref 70–99)
Glucose-Capillary: 114 mg/dL — ABNORMAL HIGH (ref 70–99)
Glucose-Capillary: 113 mg/dL — ABNORMAL HIGH (ref 70–99)

## 2023-09-16 LAB — STREP PNEUMONIAE URINARY ANTIGEN: Strep Pneumo Urinary Antigen: NEGATIVE

## 2023-09-16 LAB — I-STAT CG4 LACTIC ACID, ED: Lactic Acid, Venous: 5.3 mmol/L (ref 0.5–1.9)

## 2023-09-16 LAB — T4, FREE: Free T4: 1.32 ng/dL — ABNORMAL HIGH (ref 0.61–1.12)

## 2023-09-16 LAB — PHOSPHORUS: Phosphorus: 2.9 mg/dL (ref 2.5–4.6)

## 2023-09-16 LAB — TSH: TSH: 1.854 u[IU]/mL (ref 0.350–4.500)

## 2023-09-16 LAB — CBG MONITORING, ED: Glucose-Capillary: 95 mg/dL (ref 70–99)

## 2023-09-16 LAB — TROPONIN I (HIGH SENSITIVITY)
Troponin I (High Sensitivity): 27 ng/L — ABNORMAL HIGH (ref <18)
Troponin I (High Sensitivity): 34 ng/L — ABNORMAL HIGH (ref <18)

## 2023-09-16 LAB — MAGNESIUM: Magnesium: 1.7 mg/dL (ref 1.7–2.4)

## 2023-09-16 LAB — VITAMIN B12: Vitamin B-12: 1035 pg/mL — ABNORMAL HIGH (ref 180–914)

## 2023-09-16 MED ORDER — SODIUM CHLORIDE 0.9 % IV SOLN
100.0000 mg | Freq: Two times a day (BID) | INTRAVENOUS | Status: DC
  Administered 2023-09-16: 100 mg via INTRAVENOUS
  Filled 2023-09-16: qty 100

## 2023-09-16 MED ORDER — MUPIROCIN 2 % EX OINT
1.0000 | TOPICAL_OINTMENT | Freq: Two times a day (BID) | CUTANEOUS | Status: DC
  Administered 2023-09-16 – 2023-09-19 (×7): 1 via NASAL
  Filled 2023-09-16 (×3): qty 22

## 2023-09-16 MED ORDER — BUDESONIDE 0.5 MG/2ML IN SUSP
0.5000 mg | Freq: Two times a day (BID) | RESPIRATORY_TRACT | Status: DC
  Administered 2023-09-16 – 2023-09-19 (×6): 0.5 mg via RESPIRATORY_TRACT
  Filled 2023-09-16 (×7): qty 2

## 2023-09-16 MED ORDER — SODIUM CHLORIDE 0.9 % IV SOLN
2.0000 g | Freq: Two times a day (BID) | INTRAVENOUS | Status: DC
  Administered 2023-09-16: 2 g via INTRAVENOUS
  Filled 2023-09-16: qty 12.5

## 2023-09-16 MED ORDER — CHLORHEXIDINE GLUCONATE CLOTH 2 % EX PADS
6.0000 | MEDICATED_PAD | Freq: Every day | CUTANEOUS | Status: DC
  Administered 2023-09-16: 6 via TOPICAL

## 2023-09-16 MED ORDER — VANCOMYCIN HCL IN DEXTROSE 1-5 GM/200ML-% IV SOLN
1000.0000 mg | Freq: Once | INTRAVENOUS | Status: AC
  Administered 2023-09-16: 1000 mg via INTRAVENOUS
  Filled 2023-09-16: qty 200

## 2023-09-16 MED ORDER — LEVOFLOXACIN 750 MG PO TABS
750.0000 mg | ORAL_TABLET | ORAL | Status: DC
  Administered 2023-09-16 – 2023-09-18 (×2): 750 mg via ORAL
  Filled 2023-09-16 (×2): qty 1

## 2023-09-16 MED ORDER — PREDNISONE 10 MG PO TABS
10.0000 mg | ORAL_TABLET | Freq: Every day | ORAL | Status: DC
  Administered 2023-09-17 – 2023-09-19 (×3): 10 mg via ORAL
  Filled 2023-09-16 (×3): qty 1

## 2023-09-16 MED ORDER — LINEZOLID 600 MG PO TABS
600.0000 mg | ORAL_TABLET | Freq: Two times a day (BID) | ORAL | Status: DC
  Administered 2023-09-16 – 2023-09-19 (×7): 600 mg via ORAL
  Filled 2023-09-16 (×10): qty 1

## 2023-09-16 MED ORDER — VANCOMYCIN HCL 750 MG/150ML IV SOLN: 750.0000 mg | INTRAVENOUS | Status: DC

## 2023-09-16 MED ORDER — IPRATROPIUM-ALBUTEROL 0.5-2.5 (3) MG/3ML IN SOLN
3.0000 mL | Freq: Two times a day (BID) | RESPIRATORY_TRACT | Status: DC
  Administered 2023-09-16 – 2023-09-19 (×5): 3 mL via RESPIRATORY_TRACT
  Filled 2023-09-16 (×5): qty 3

## 2023-09-16 MED ORDER — IPRATROPIUM-ALBUTEROL 0.5-2.5 (3) MG/3ML IN SOLN
3.0000 mL | RESPIRATORY_TRACT | Status: DC | PRN
  Administered 2023-09-16: 3 mL via RESPIRATORY_TRACT
  Filled 2023-09-16: qty 3

## 2023-09-16 MED ORDER — ACETAMINOPHEN 325 MG PO TABS
325.0000 mg | ORAL_TABLET | ORAL | Status: DC | PRN
  Administered 2023-09-16: 325 mg via ORAL
  Filled 2023-09-16: qty 1

## 2023-09-16 MED ORDER — BUDESONIDE 0.5 MG/2ML IN SUSP: 0.5000 mg | Freq: Two times a day (BID) | RESPIRATORY_TRACT | Status: DC

## 2023-09-16 MED ORDER — OXYCODONE-ACETAMINOPHEN 5-325 MG PO TABS
1.0000 | ORAL_TABLET | ORAL | Status: DC | PRN
  Administered 2023-09-16 – 2023-09-19 (×10): 1 via ORAL
  Filled 2023-09-16 (×11): qty 1

## 2023-09-16 MED ORDER — NOREPINEPHRINE 4 MG/250ML-% IV SOLN
INTRAVENOUS | Status: AC
  Filled 2023-09-16: qty 250

## 2023-09-16 MED ORDER — MIDODRINE HCL 5 MG PO TABS
5.0000 mg | ORAL_TABLET | Freq: Three times a day (TID) | ORAL | Status: DC
  Administered 2023-09-16 – 2023-09-19 (×10): 5 mg via ORAL
  Filled 2023-09-16 (×10): qty 1

## 2023-09-16 MED ORDER — LINEZOLID 100 MG/5ML PO SUSR
600.0000 mg | Freq: Two times a day (BID) | ORAL | Status: DC
  Filled 2023-09-16 (×2): qty 30

## 2023-09-16 MED ORDER — IPRATROPIUM-ALBUTEROL 0.5-2.5 (3) MG/3ML IN SOLN
3.0000 mL | Freq: Four times a day (QID) | RESPIRATORY_TRACT | Status: DC
  Administered 2023-09-16: 3 mL via RESPIRATORY_TRACT
  Filled 2023-09-16: qty 3

## 2023-09-16 NOTE — Progress Notes (Signed)
eLink Physician-Brief Progress Note Patient Name: Kristen [name] DOB: 03-Apr-1946 MRN: [id number]   Date of Service  09/16/2023  HPI/Events of Note  Patient admitted with multi-focal pneumonia, septic shock with encephalopathy, acute hypoxemic respiratory failure, she is DNI / DNR.  eICU Interventions  New Patient Evaluation.        Thomasene Lot Danyale Ridinger 09/16/2023, 1:51 AM

## 2023-09-16 NOTE — Progress Notes (Signed)
At bedside for PICC placement. Upon assessment, no suitable vessels identified in bilateral upper extremities. Vessels >45% occupancy, and no cephalic vessels in R and LUA. Furthermore,skin turgor is extremely poor with numerous fluid-filled pockets, weeping and edema. RN made aware. Recommend central access for further venous access.

## 2023-09-16 NOTE — Progress Notes (Signed)
NAME:  Kristen [name], MRN:  [id number], DOB:  [birth year], LOS: 1 ADMISSION DATE:  09/15/2023, CONSULTATION DATE:  09/16/23 REFERRING MD:  Kayleen Memos. Charm Barges, CHIEF COMPLAINT:  Shock   History of Present Illness:  History obtained from the ED provider and the patient's electronic medical records given her altered mentation. 77 y.o. female transferred from her facility with altered mental status.  Patient was reportedly at her baseline around 3 PM today.  Patient was found to be hypoxic with altered mental status.  EMS reported her saturation was in the 70% range on room air on arrival.  Patient was initially on nonrebreather and then transitioned to nasal cannula oxygen.  Patient notably altered on arrival as well.  Family have called to receive update but I have been unable to reach her family by phone.  She does have a MOST/DNR form at bedside that was brought with her.  Patient was given bolus IV fluid on presentation and started on peripherally infuse Levophed for treatment of her shock post fluid resuscitation.  Patient's lactic acid was notably elevated on arrival as well.  Code sepsis was activated.  Patient was given empiric Rocephin and Zithromax.  ED provider subsequently consulted critical care for further treatment and admission.  Significant Hospital Events: Including procedures, antibiotic start and stop dates in addition to other pertinent events   10/23 - Admitted with Shock and Acute Encephalopathy  Interim History / Subjective:  N/A  Objective   Blood pressure 118/74, pulse (!) 108, temperature 98.3 F (36.8 C), temperature source Axillary, resp. rate (!) 21, height 5\' 10"  (1.778 m), weight 56.3 kg, SpO2 100%.        Intake/Output Summary (Last 24 hours) at 09/16/2023 0843 Last data filed at 09/16/2023 0501 Gross per 24 hour  Intake 3828.2 ml  Output --  Net 3828.2 ml   Filed Weights   09/16/23 0000 09/16/23 0228  Weight: 39.9 kg 56.3 kg    Examination: General:  Chronically ill-appearing debilitated female appears older than stated age Extremities: Edema, scattered petechia HEENT: Dry mucous membranes Cardiovascular: Regular rate rhythm S1-S2 Pulmonary: Diminished breath sounds bilaterally no crackles no wheeze  Abdomen: Soft nontender nondistended Neurological: Alert oriented following commands  Resolved Hospital Problem list   N/A  Assessment & Plan:  77 y.o. female who currently resides in a skilled nursing facility presents with acute hypoxic respiratory failure and shock.  Patient remains in a state of shock despite fluid and volume resuscitation.  Patient did arrive with a MOST form documenting a DO NOT RESUSCITATE status.  I have attempted to contact family by phone repeatedly but unsuccessfully.  septic shock: Potential sources include pneumonia.,  Possible UTI. Multifocal pneumonia Lactic acidosis present on admission Acute metabolic encephalopathy secondary to above Plan: Titrate off pressors today. Cultures pending. De-escalate antibiotics to cefepime plus vancomycin PCR swabs negative   Acute hypoxic respiratory failure: FiO2 requirement is improving some with treatment of shock..  Secondary to multifocal pneumonia.  Plan: Wean from oxygen support, goal SpO2 greater than 90%.  Type II non-STEMI: Likely secondary to shock, acidosis, and hypoxia.  EKG with significant artifact.   - supportive care   Anemia -No acute signs of bleeding  COPD: Unclear severity.   Plan: Continue nebulized treatments.  Paroxysmal atrial fibrillation: Continuing telemetry monitoring.  Elbow swelling Was on 20 mg twice daily prednisone at nursing facility since October 1. Start prednisone 10 mg daily to have some steroid replacement  Best Practice (right click and "Reselect all  SmartList Selections" daily)   Diet/type: NPO DVT prophylaxis: SCD GI prophylaxis: N/A Lines: N/A Foley:  N/A Code Status:  DNR Last date of multidisciplinary  goals of care discussion [N/A]  Labs   CBC: Recent Labs  Lab 09/15/23 1850 09/15/23 1859 09/16/23 0316  WBC 14.4*  --  15.8*  NEUTROABS 11.9*  --  13.7*  HGB 8.7* 10.2*  10.2* 8.4*  HCT 30.4* 30.0*  30.0* 26.5*  MCV 105.6*  --  95.3  PLT 445*  --  365    Basic Metabolic Panel: Recent Labs  Lab 09/15/23 1850 09/15/23 1859 09/16/23 0036  NA 140 140  140 139  K 3.7 3.7  3.7 3.9  CL 110 110 110  CO2 15*  --  13*  GLUCOSE 118* 116* 104*  BUN 14 15 12   CREATININE 1.13* 0.80 1.04*  CALCIUM 7.7*  --  7.4*  MG 1.9  --  1.7  PHOS  --   --  2.9   GFR: Estimated Creatinine Clearance: 39.6 mL/min (A) (by C-G formula based on SCr of 1.04 mg/dL (H)). Recent Labs  Lab 09/15/23 1850 09/15/23 1900 09/15/23 2157 09/15/23 2359 09/16/23 0036 09/16/23 0316  PROCALCITON  --   --   --   --  0.85  --   WBC 14.4*  --   --   --   --  15.8*  LATICACIDVEN  --  6.5* 6.7* 5.3*  --   --     Liver Function Tests: Recent Labs  Lab 09/15/23 1850 09/16/23 0036  AST 51* 49*  ALT 28 28  ALKPHOS 239* 211*  BILITOT 0.8 0.6  PROT 4.5* 3.9*  ALBUMIN <1.5* <1.5*   No results for input(s): "LIPASE", "AMYLASE" in the last 168 hours. Recent Labs  Lab 09/15/23 1850  AMMONIA 73*    ABG    Component Value Date/Time   PHART 7.394 09/04/2021 0811   PCO2ART 44.3 09/04/2021 0811   PO2ART 58.5 (L) 09/04/2021 0811   HCO3 14.1 (L) 09/15/2023 1859   TCO2 15 (L) 09/15/2023 1859   TCO2 15 (L) 09/15/2023 1859   ACIDBASEDEF 10.0 (H) 09/15/2023 1859   O2SAT 92 09/15/2023 1859     Coagulation Profile: Recent Labs  Lab 09/15/23 1850  INR 1.3*    Cardiac Enzymes: No results for input(s): "CKTOTAL", "CKMB", "CKMBINDEX", "TROPONINI" in the last 168 hours.  HbA1C: Hgb A1c MFr Bld  Date/Time Value Ref Range Status  06/29/2020 04:27 PM 5.6 4.8 - 5.6 % Final    Comment:    (NOTE) Pre diabetes:          5.7%-6.4%  Diabetes:              >6.4%  Glycemic control for   <7.0% adults  with diabetes     CBG: Recent Labs  Lab 09/15/23 1849 09/16/23 0025 09/16/23 0315  GLUCAP 119* 95 114*    IMAGING: PORT CXR 09/15/23 (personally reviewed by me):  X-ray performed semierect.  Film also rotated slightly to the left.  Hazy bilateral lower lung opacities without focal area of consolidation.  Cannot exclude small pleural effusions.  Trachea midline overall.  Normal mediastinal contour.  No pneumothorax.  CT HEAD W/O CONTRAST 09/15/23 (per radiologist): IMPRESSION: 1. No acute intracranial process. 2. Mild chronic microvascular ischemic changes and possible old lacunar infarct in the right cerebellar hemisphere. 3. Complete opacification of the mastoid air cells and middle ear on the right, possible effusion versus cholesteatoma.  Review of Systems:  Unable to obtain a review of systems given patient's altered mental status.   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   Surgical History:   Past Surgical History:  Procedure Laterality Date   IR THORACENTESIS ASP PLEURAL SPACE W/IMG GUIDE  10/03/2021   VASCULAR SURGERY       Social History:   reports that she quit smoking about 3 years ago. Her smoking use included cigarettes. She started smoking about 63 years ago. She has a 60 pack-year smoking history. She has never used smokeless tobacco. She reports current alcohol use. She reports that she does not use drugs.   Family History:  Her family history includes Diabetes in her father and mother; Diabetes Mellitus II in her mother; Diverticulitis in her sister.   Allergies Allergies  Allergen Reactions   Gabapentin Other (See Comments)    Hallucination/ nightmares- "Allergic," per [facility]     Home Medications  Prior to Admission  medications   Medication Sig Start Date End Date Taking? Authorizing Provider  albuterol (VENTOLIN HFA) 108 (90 Base) MCG/ACT inhaler INHALE 1-2 PUFFS BY MOUTH EVERY 6 HOURS AS NEEDED FOR WHEEZE OR SHORTNESS OF BREATH Patient taking differently: Inhale 2 puffs into the lungs See admin instructions. Inhale 2 puffs into the lungs two times a day and every 6 hours as needed for COPD 09/30/21  Yes Evlyn Kanner, MD  amiodarone (PACERONE) 200 MG tablet TAKE 1 TABLET BY MOUTH EVERY DAY Patient taking differently: Take 200 mg by mouth daily. 08/05/21  Yes Evlyn Kanner, MD  benzocaine-menthol (CHLORASEPTIC SORE THROAT) 6-10 MG lozenge Take 1 lozenge by mouth 4 (four) times daily as needed for sore throat.   Yes [provider]  budesonide-formoterol (SYMBICORT) 160-4.5 MCG/ACT inhaler Inhale 2 puffs into the lungs in the morning and at bedtime. 09/06/23  Yes [provider]  busPIRone (BUSPAR) 10 MG tablet Take 1 tablet (10 mg total) by mouth daily. Patient taking differently: Take 10 mg by mouth 3 (three) times daily. 07/03/21  Yes Katsadouros, Vasilios, MD  carbamide peroxide (DEBROX) 6.5 % OTIC solution Place 5 drops into both ears 2 (two) times daily.   Yes [provider]  Dextran 70-Hypromellose 0.1-0.3 % SOLN Apply 1 drop to eye in the morning, at noon, and at bedtime.   Yes [provider]  docusate sodium (COLACE) 100 MG capsule Take 1 capsule (100 mg total) by mouth 2 (two) times daily as needed for mild constipation. Patient taking differently: Take 100 mg by mouth 2 (two) times daily. 06/03/21  Yes Dolan Amen, MD  Emollient (EUCERIN) lotion Apply 1 Application topically in the morning and at bedtime.   Yes [provider]  feeding supplement (ENSURE ENLIVE / ENSURE PLUS) LIQD Take 237 mLs by mouth 2 (two) times daily between meals. 09/17/21  Yes Narda Bonds, MD  ferrous sulfate 324 MG TBEC Take 324 mg by mouth 2 (two) times daily with a  meal.   Yes [provider]  fluticasone (FLONASE) 50 MCG/ACT nasal spray Place 1 spray into both nostrils daily as needed for allergies or rhinitis. 03/31/21  Yes Katsadouros, Vasilios, MD  hydrOXYzine (ATARAX) 50 MG tablet Take 50 mg by mouth 2 (two) times daily. 09/09/23  Yes [provider]  ipratropium (ATROVENT) 0.03 % nasal spray Place  1 spray into both nostrils daily. 06/07/23  Yes [provider]  levothyroxine (SYNTHROID) 100 MCG tablet Take 100 mcg by mouth daily. 09/14/23  Yes [provider]  magnesium oxide (MAG-OX) 400 MG tablet Take 400 mg by mouth daily.   Yes [provider]  midodrine (PROAMATINE) 5 MG tablet Take 1 tablet (5 mg total) by mouth 3 (three) times daily with meals. 09/17/21  Yes Narda Bonds, MD  mirtazapine (REMERON) 15 MG tablet Take 1 tablet (15 mg total) by mouth at bedtime as needed (sleep). Patient taking differently: Take 15 mg by mouth at bedtime. 03/18/21  Yes Theotis Barrio, MD  montelukast (SINGULAIR) 10 MG tablet Take 10 mg by mouth daily. 09/13/23  Yes [provider]  mupirocin ointment (BACTROBAN) 2 % Apply 1 Application topically daily. 09/01/23  Yes [provider]  [facility] powder Apply 1 Application topically daily as needed (apply to sacrum, under breasts, and buttocks as needed). 09/07/23  Yes [provider]  ondansetron (ZOFRAN-ODT) 4 MG disintegrating tablet Take 2 mg by mouth 2 (two) times daily as needed for nausea. 08/31/23  Yes [provider]  oxyCODONE-acetaminophen (PERCOCET/ROXICET) 5-325 MG tablet Take 1 tablet by mouth every 4 (four) hours as needed for severe pain. Patient taking differently: Take 1 tablet by mouth every 6 (six) hours as needed for moderate pain (pain score 4-6). 10/28/21  Yes Alwyn Ren, MD  pantoprazole (PROTONIX) 40 MG tablet Take 1 tablet (40 mg total) by mouth daily at 6 (six) AM. Patient taking differently: Take 40 mg by mouth  every other day. 09/18/21  Yes Narda Bonds, MD  polyethylene glycol (MIRALAX / GLYCOLAX) 17 g packet Take 17 g by mouth daily as needed for moderate constipation. Patient taking differently: Take 17 g by mouth daily. Mix 17 grams of powder into 6 ounces of fluid and drink every morning 06/05/21  Yes Dolan Amen, MD  predniSONE (DELTASONE) 20 MG tablet Take 20 mg by mouth 2 (two) times daily. 08/23/23  Yes [provider]  simethicone (MYLICON) 80 MG chewable tablet Chew 80 mg by mouth daily as needed for flatulence.   Yes [provider]  triamcinolone (KENALOG) 0.025 % ointment Apply 1 Application topically in the morning and at bedtime. 08/05/23  Yes [provider]  umeclidinium bromide (INCRUSE ELLIPTA) 62.5 MCG/INH AEPB INHALE 1 PUFF BY MOUTH EVERY DAY Patient taking differently: Inhale 1 puff into the lungs daily. 07/17/21  Yes Dellia Cloud, MD  Vitamin D, Ergocalciferol, (DRISDOL) 1.25 MG (50000 UNIT) CAPS capsule Take 50,000 Units by mouth once a week. Takes on Mondays 08/30/23  Yes [provider]  XARELTO 15 MG TABS tablet TAKE 1 TABLET (15 MG TOTAL) BY MOUTH DAILY. 09/19/21  Yes Evlyn Kanner, MD     Josephine Igo, DO [facility] 09/16/2023 8:43 AM

## 2023-09-16 NOTE — Plan of Care (Signed)

## 2023-09-16 NOTE — Consult Note (Signed)
Consultation Note Date: 09/16/2023   Patient Name: Kristen [name]  DOB: 04/19/1946  MRN: [id number]  Age / Sex: 77 y.o., female  PCP: System, Provider Not In Referring Physician: Josephine Igo, DO  Reason for Consultation: Establishing goals of care and Psychosocial/spiritual support  HPI/Patient Profile: 77 y.o. female   admitted on 09/15/2023 /transferred from her facility/ [facility] with altered mental status.   Patient has been a resident of [facility] for 18 months.  Patient has had continued physical and functional decline over the past year.   Patient was found to be hypoxic with altered mental status. EMS reported her saturation was in the 70% range on room air on arrival.   Admitted with shock and acute encephalopathy secondary to multifocal pneumonia  Patient and her family face treatment option decisions, advanced directive decisions and anticipatory care needs.   Clinical Assessment and Goals of Care:  This NP Lorinda Creed reviewed medical records, received report from team, assessed the patient and then meet at the patient's bedside  to discuss diagnosis, prognosis, GOC, EOL wishes disposition and options.  Concept of Palliative Care was introduced as specialized medical care for people and their families living with serious illness.  If focuses on providing relief from the symptoms and stress of a serious illness.  The goal is to improve quality of life for both the patient and the family.  Values and goals of care important to patient and family were attempted to be elicited.  Initially I was able to speak with patient directly.  She verbalizes an understanding of the seriousness of her current medical situation and clearly verbalizes her desire for a more comfort path allowing for natural death.  Her sister Kristen [name] is her main support person and is the person who helps patient with  healthcare decisions.  With patient's permission I contacted sister.  I spoke to her sister/ Kristen [name] by phone.   Education offered on patient's current medical situation, we discussed that patient is tenuous and at high risk for decompensation.  I shared with her sister patient's verbalized desire to focus on a more comfort path.  Kristen [name] validates that she and her sister have talked in the past and that she "is ready", and understands her desire to forego life-prolonging measures.          Later in the afternoon family gathered at the bedside to include Kristen [name]  Doss/sister, patient's niece, 2 of patient's granddaughters.    Again created space and opportunity for patient  and family to explore thoughts and feelings regarding current medical situation.  Patient is able to verbalize to her family that she understands her time is limited and that the most important thing for her at this time is comfort, allowing for natural death.     Patient's sister and other family members in the room support [name]'s decision.  There was 1 granddaughter who is struggling with decision and left the room.  The rest of the family tell me that that is not  unusual for her " she will be all right"  Conversation continued with patient and family as a comfort path plan was outlined.    A  discussion was had today regarding advanced directives.  Concepts specific to code status, artifical feeding and hydration, continued IV antibiotics and rehospitalization was had.  Comfort feeds as tolerated  The difference between a aggressive medical intervention path  and a palliative comfort care path for this patient at this time was had.     MOST form reintroduced (patient had a previous MOST form from 2022) new form completed and updated to reflect full comfort.  -DNR DNI -No artificial feeding or hydration now or in the future--comfort feeds as tolerated -No IV antibiotics-patient is okay with oral antibiotics as  long as she can swallow and tolerate them -No further diagnostics, lab draws or other life-prolonging measures -Symptom management for pain and dyspnea/ consider low dose  Morphine for dyspnea -Reevaluate in the morning for transition of care options  Comfort and dignity are the focus of care.    Education offered on hospice benefit; philosophy and eligibility.  We discussed hospice services both at a skilled nursing facility and residential hospice.   Natural trajectory and expectations at EOL were discussed.  Questions and concerns addressed.  Patient  encouraged to call with questions or concerns.     PMT will continue to support holistically.     Patient is currently making her own healthcare decisions.  She does verbalize that in the event that she cannot, she  wishes for her sister/ Kristen [name] to be her healthcare decision-maker   SUMMARY OF RECOMMENDATIONS    Code Status/Advance Care Planning: DNR   Symptom Management:  Percocet-1 tablet oral every 4 hours as needed for moderate pain.   Percocet is what patient has used in the past and works best to control her overall generalized pain.  Palliative Prophylaxis:  Aspiration, Bowel Regimen, Delirium Protocol, Frequent Pain Assessment, and Oral Care  Additional Recommendations (Limitations, Scope, Preferences): Full Comfort Care  Psycho-social/Spiritual:  Desire for further Chaplaincy support:no-declined Additional Recommendations: Education on Hospice  Prognosis:  Limited prognosis -- will reevaluate in the morning for more accurate prognosis  Discharge Planning: To Be Determined      Primary Diagnoses: Present on Admission:  Shock (HCC)   I have reviewed the medical record, interviewed the patient and family, and examined the patient. The following aspects are pertinent.  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 [facility])    Social History   Socioeconomic History   Marital status: Divorced    Spouse name: Not on file   Number of children: Not on file   Years of education: Not on file   Highest education level: Not on file  Occupational History    Comment: RETIRED  Tobacco Use   Smoking status: Former    Current packs/day: 0.00    Average packs/day: 1 pack/day for 60.0 years (60.0 ttl pk-yrs)    Types: Cigarettes    Start date: 10/16/1959    Quit date: 10/16/2019    Years since quitting: 3.9   Smokeless tobacco: Never   Tobacco comments:    2-3  per day   Vaping Use   Vaping status: Never Used  Substance and Sexual Activity   Alcohol use: Yes    Comment: occasionally   Drug use: Never   Sexual activity: Not on file  Other Topics Concern   Not on file  Social History Narrative   Not on file   Social Determinants of Health   Financial Resource Strain: Not on file  Food Insecurity: Patient Declined (09/16/2023)   Hunger Vital Sign    Worried About Running Out of Food in the Last Year: Patient declined    Ran Out of Food in the Last Year: Patient declined  Transportation Needs: Patient Unable To Answer (09/16/2023)   PRAPARE - Administrator, Civil Service (Medical): Patient unable to answer    Lack of Transportation (Non-Medical): Patient unable to answer  Physical Activity: Not on file  Stress: Not on file  Social Connections: Not on file   Family History  Problem Relation Age of Onset   Diabetes Mother    Diabetes Mellitus II Mother    Diabetes Father    Diverticulitis Sister    Scheduled Meds:  budesonide (PULMICORT) nebulizer solution  0.5 mg Nebulization BID   Chlorhexidine Gluconate Cloth  6 each Topical Daily   Chlorhexidine Gluconate Cloth  6 each Topical Q0600    insulin aspart  0-6 Units Subcutaneous Q4H   ipratropium-albuterol  3 mL Nebulization BID   midodrine  5 mg Oral TID WC   mupirocin ointment  1 Application Nasal BID   [START ON 09/17/2023] predniSONE  10 mg Oral Q breakfast   Continuous Infusions:  sodium chloride     sodium chloride 10 mL/hr at 09/16/23 0600   ceFEPime (MAXIPIME) IV Stopped (09/16/23 0113)   lactated ringers 150 mL/hr at 09/16/23 0600   norepinephrine     [START ON 09/18/2023] vancomycin     PRN Meds:.acetaminophen, docusate sodium, ipratropium-albuterol, norepinephrine, polyethylene glycol Medications Prior to Admission:  Prior to Admission medications   Medication Sig Start Date End Date Taking? Authorizing Provider  albuterol (VENTOLIN HFA) 108 (90 Base) MCG/ACT inhaler INHALE 1-2 PUFFS BY MOUTH EVERY 6 HOURS AS NEEDED FOR WHEEZE OR SHORTNESS OF BREATH Patient taking differently: Inhale 2 puffs into the lungs See admin instructions. Inhale 2 puffs into the lungs two times a day and every 6 hours as needed for COPD 09/30/21  Yes Evlyn Kanner, MD  amiodarone (PACERONE) 200 MG tablet TAKE 1 TABLET BY MOUTH EVERY DAY Patient taking differently: Take 200 mg by mouth daily. 08/05/21  Yes Evlyn Kanner, MD  benzocaine-menthol (CHLORASEPTIC SORE THROAT) 6-10 MG lozenge Take 1 lozenge by mouth 4 (four) times daily as needed for sore throat.   Yes [provider]  budesonide-formoterol (SYMBICORT) 160-4.5 MCG/ACT inhaler Inhale 2 puffs into the lungs in the morning and at bedtime. 09/06/23  Yes [provider]  busPIRone (BUSPAR) 10 MG tablet Take 1 tablet (10 mg total) by mouth daily. Patient taking differently: Take 10 mg by mouth 3 (three) times daily. 07/03/21  Yes Katsadouros, Vasilios, MD  carbamide peroxide (DEBROX) 6.5 % OTIC solution Place 5 drops into both ears 2 (two) times daily.   Yes [provider]  Dextran 70-Hypromellose 0.1-0.3 % SOLN Apply 1 drop to eye in the morning, at  noon, and at bedtime.   Yes [provider]  docusate sodium (COLACE) 100 MG capsule Take 1 capsule (100 mg total) by mouth 2 (two) times daily as needed for  mild constipation. Patient taking differently: Take 100 mg by mouth 2 (two) times daily. 06/03/21  Yes Dolan Amen, MD  Emollient (EUCERIN) lotion Apply 1 Application topically in the morning and at bedtime.   Yes [provider]  feeding supplement (ENSURE ENLIVE / ENSURE PLUS) LIQD Take 237 mLs by mouth 2 (two) times daily between meals. 09/17/21  Yes Narda Bonds, MD  ferrous sulfate 324 MG TBEC Take 324 mg by mouth 2 (two) times daily with a meal.   Yes [provider]  fluticasone (FLONASE) 50 MCG/ACT nasal spray Place 1 spray into both nostrils daily as needed for allergies or rhinitis. 03/31/21  Yes Katsadouros, Vasilios, MD  hydrOXYzine (ATARAX) 50 MG tablet Take 50 mg by mouth 2 (two) times daily. 09/09/23  Yes [provider]  ipratropium (ATROVENT) 0.03 % nasal spray Place 1 spray into both nostrils daily. 06/07/23  Yes [provider]  levothyroxine (SYNTHROID) 100 MCG tablet Take 100 mcg by mouth daily. 09/14/23  Yes [provider]  magnesium oxide (MAG-OX) 400 MG tablet Take 400 mg by mouth daily.   Yes [provider]  midodrine (PROAMATINE) 5 MG tablet Take 1 tablet (5 mg total) by mouth 3 (three) times daily with meals. 09/17/21  Yes Narda Bonds, MD  mirtazapine (REMERON) 15 MG tablet Take 1 tablet (15 mg total) by mouth at bedtime as needed (sleep). Patient taking differently: Take 15 mg by mouth at bedtime. 03/18/21  Yes Theotis Barrio, MD  montelukast (SINGULAIR) 10 MG tablet Take 10 mg by mouth daily. 09/13/23  Yes [provider]  mupirocin ointment (BACTROBAN) 2 % Apply 1 Application topically daily. 09/01/23  Yes [provider]  [facility] powder Apply 1 Application topically daily as needed (apply to sacrum, under breasts, and buttocks as  needed). 09/07/23  Yes [provider]  ondansetron (ZOFRAN-ODT) 4 MG disintegrating tablet Take 2 mg by mouth 2 (two) times daily as needed for nausea. 08/31/23  Yes [provider]  oxyCODONE-acetaminophen (PERCOCET/ROXICET) 5-325 MG tablet Take 1 tablet by mouth every 4 (four) hours as needed for severe pain. Patient taking differently: Take 1 tablet by mouth every 6 (six) hours as needed for moderate pain (pain score 4-6). 10/28/21  Yes Alwyn Ren, MD  pantoprazole (PROTONIX) 40 MG tablet Take 1 tablet (40 mg total) by mouth daily at 6 (six) AM. Patient taking differently: Take 40 mg by mouth every other day. 09/18/21  Yes Narda Bonds, MD  polyethylene glycol (MIRALAX / GLYCOLAX) 17 g packet Take 17 g by mouth daily as needed for moderate constipation. Patient taking differently: Take 17 g by mouth daily. Mix 17 grams of powder into 6 ounces of fluid and drink every morning 06/05/21  Yes Dolan Amen, MD  predniSONE (DELTASONE) 20 MG tablet Take 20 mg by mouth 2 (two) times daily. 08/23/23  Yes [provider]  simethicone (MYLICON) 80 MG chewable tablet Chew 80 mg by mouth daily as needed for flatulence.   Yes [provider]  triamcinolone (KENALOG) 0.025 % ointment Apply 1 Application topically in the morning and at bedtime. 08/05/23  Yes [provider]  umeclidinium bromide (INCRUSE ELLIPTA) 62.5 MCG/INH AEPB INHALE 1 PUFF BY MOUTH EVERY DAY Patient taking differently: Inhale 1 puff into the lungs daily. 07/17/21  Yes Dellia Cloud, MD  Vitamin D, Ergocalciferol, (DRISDOL) 1.25 MG (50000 UNIT) CAPS capsule Take 50,000 Units by mouth once a week. Takes on Mondays 08/30/23  Yes  [provider]  XARELTO 15 MG TABS tablet TAKE 1 TABLET (15 MG TOTAL) BY MOUTH DAILY. 09/19/21  Yes Evlyn Kanner, MD   Allergies  Allergen Reactions   Gabapentin Other (See Comments)    Hallucination/ nightmares- "Allergic," per [facility]   Review of  Systems  Constitutional:        "I just hurt all over"  Neurological:  Positive for weakness.    Physical Exam Constitutional:      Appearance: She is cachectic. She is ill-appearing.  Cardiovascular:     Rate and Rhythm: Tachycardia present.  Skin:    General: Skin is warm.     Findings: Bruising present.     Comments: BUE edematous   Neurological:     Mental Status: She is oriented to person, place, and time.  Psychiatric:        Cognition and Memory: Cognition normal.     Vital Signs: BP (!) 80/56   Pulse (!) 118   Temp 98.3 F (36.8 C) (Axillary)   Resp 17   Ht 5\' 10"  (1.778 m)   Wt 56.3 kg   SpO2 98%   BMI 17.81 kg/m  Pain Scale: 0-10   Pain Score: 0-No pain   SpO2: SpO2: 98 % O2 Device:SpO2: 98 % O2 Flow Rate: .O2 Flow Rate (L/min): 4 L/min  IO: Intake/output summary:  Intake/Output Summary (Last 24 hours) at 09/16/2023 1121 Last data filed at 09/16/2023 0501 Gross per 24 hour  Intake 3828.2 ml  Output --  Net 3828.2 ml    LBM: Last BM Date : 09/15/23 Baseline Weight: Weight: 39.9 kg Most recent weight: Weight: 56.3 kg     Palliative Assessment/Data:  30 % at best    Time  90 minutes  Discussed with Dr. I card and bedside RN  Signed by: Lorinda Creed, NP   Please contact Palliative Medicine Team phone at [phone] for questions and concerns.  For individual provider: See Loretha Stapler

## 2023-09-16 NOTE — Progress Notes (Signed)
Patient is from [facility] long term. CSW will continue to follow and assist with patients dc planning needs.

## 2023-09-16 NOTE — Progress Notes (Signed)
Called to bedside.  Pt has no IV access Family all here.  Discussed CVL but I think that exceeds the limitations previously set and the family agrees.  Plan DNR./DNI No central access Try orals If fails comfort.   Simonne Martinet ACNP-BC [facility] Pulmonary/Critical Care Pager # [phone] OR # [phone] if no answer

## 2023-09-16 NOTE — Progress Notes (Signed)
Unable to maintain peripheral IV's due to pt's poor vasculature and friable skin. IV team at bedside to assess for PICC placement but they were unable to see a usable vein. Hypotensive. Notified Dr Tonia Brooms of current condition. Family at bedside. Updated them on current condition and they verbalize understanding.

## 2023-09-16 NOTE — Progress Notes (Signed)
Pharmacy Antibiotic Note  Kristen [name] is a 77 y.o. female presented on 09/15/2023 with AMS with concern for sepsis. Initially received community acquired pneumonia coverage with ceftriaxone and azithromycin. Required pressors and worsening so antibiotics were broadened.  Pharmacy had been consulted for cefepime and vancomycin dosing, but then patient lost IV access, unable to regain peripheral access, and does not desire a central line.  Plan: Stop vancomycin and cefepime Start linezolid 600 mg IV q 12 hrs Levaquin 750 mg IV q 48 hrs   Height: 5\' 10"  (177.8 cm) Weight: 56.3 kg (124 lb 1.9 oz) IBW/kg (Calculated) : 68.5  Temp (24hrs), Avg:98.2 F (36.8 C), Min:97.7 F (36.5 C), Max:99 F (37.2 C)  Recent Labs  Lab 09/15/23 1850 09/15/23 1859 09/15/23 1900 09/15/23 2157 09/15/23 2359 09/16/23 0036 09/16/23 0316  WBC 14.4*  --   --   --   --   --  15.8*  CREATININE 1.13* 0.80  --   --   --  1.04*  --   LATICACIDVEN  --   --  6.5* 6.7* 5.3*  --   --     Estimated Creatinine Clearance: 39.6 mL/min (A) (by C-G formula based on SCr of 1.04 mg/dL (H)).    Allergies  Allergen Reactions   Gabapentin Other (See Comments)    Hallucination/ nightmares- "Allergic," per MAR    Antimicrobials this admission: Vancomycin 10/24 > 10/24 Cefepime 10/24 > 10/24 Azithro/CTX/doxy x 1 Levaquin 10/24 >  Linezolid 10/24 >  Microbiology results: 10/24 MRSA nares: pending 10/24 resp panel: pending 10/23 Bcx: pending  Thank you for allowing pharmacy to be a part of this patient's care.  Reece Leader, Colon Flattery, BCCP Clinical Pharmacist  09/16/2023 2:12 PM   [facility] pharmacy phone numbers are listed on [email or web address]

## 2023-09-16 NOTE — Progress Notes (Signed)
Came bedside for echo, but I was told family no longer wants any invasive/diagnostic testing at this time.

## 2023-09-16 NOTE — Progress Notes (Signed)
Pharmacy Antibiotic Note  Kristen [name] is a 77 y.o. female presented on 09/15/2023 with AMS with concern for sepsis. Initially received community acquired pneumonia coverage with ceftriaxone and azithromycin. Required pressors and worsening so antibiotics were broadened.  Pharmacy has been consulted for cefepime and vancomycin dosing.  Plan: Cefepime 2g q12h Vancomycin 1000mg  x 1 load > vancomycin 750mg  q48h (eAUC 471, Scr 1.04) F/u renal function, micro data and narrow as able Vancomycin levels as indicated     Temp (24hrs), Avg:99 F (37.2 C), Min:99 F (37.2 C), Max:99 F (37.2 C)  Recent Labs  Lab 09/15/23 1850 09/15/23 1859 09/15/23 1900 09/15/23 2157 09/15/23 2359  WBC 14.4*  --   --   --   --   CREATININE 1.13* 0.80  --   --   --   LATICACIDVEN  --   --  6.5* 6.7* 5.3*    CrCl cannot be calculated (Unknown ideal weight.).    Allergies  Allergen Reactions   Gabapentin Other (See Comments)    Hallucination/ nightmares- "Allergic," per MAR    Antimicrobials this admission: Ceftriaxone 10/23 x 1 Azithromycin 10/23 x 1  Cefepime 10/24 > Vancomycin 10/24 >   Microbiology results: 10/24 MRSA nares: pending 10/24 resp panel: pending 10/23 Bcx: pending  Thank you for allowing pharmacy to be a part of this patient's care.  Marja Kays 09/16/2023 12:17 AM

## 2023-09-17 DIAGNOSIS — R579 Shock, unspecified: Secondary | ICD-10-CM | POA: Diagnosis not present

## 2023-09-17 DIAGNOSIS — J189 Pneumonia, unspecified organism: Secondary | ICD-10-CM | POA: Diagnosis not present

## 2023-09-17 DIAGNOSIS — Z515 Encounter for palliative care: Secondary | ICD-10-CM | POA: Diagnosis not present

## 2023-09-17 DIAGNOSIS — J9601 Acute respiratory failure with hypoxia: Secondary | ICD-10-CM | POA: Diagnosis not present

## 2023-09-17 DIAGNOSIS — Z66 Do not resuscitate: Secondary | ICD-10-CM

## 2023-09-17 LAB — LEGIONELLA PNEUMOPHILA SEROGP 1 UR AG: L. pneumophila Serogp 1 Ur Ag: NEGATIVE

## 2023-09-17 MED ORDER — ONDANSETRON HCL 4 MG PO TABS
4.0000 mg | ORAL_TABLET | Freq: Three times a day (TID) | ORAL | Status: DC | PRN
  Administered 2023-09-17 – 2023-09-19 (×3): 4 mg via ORAL
  Filled 2023-09-17 (×2): qty 1

## 2023-09-17 MED ORDER — ONDANSETRON 4 MG PO TBDP: 4.0000 mg | ORAL_TABLET | Freq: Three times a day (TID) | ORAL | Status: DC | PRN

## 2023-09-17 NOTE — Progress Notes (Signed)
Patient ID: Kristen [name], female   DOB: [birth year], 77 y.o.   MRN: [id number]    Progress Note from the Palliative Medicine Team at [facility]   Patient Name: Kristen [name]        Date: 09/17/2023 DOB: 1946-09-22  Age: 77 y.o. MRN#: [id number] Attending Physician: Narda Bonds, MD Primary Care Physician: System, Provider Not In Admit Date: 09/15/2023   Reason for Consultation/Follow-up   Establishing Goals of Care   HPI/ Brief Hospital Review 77 y.o. female   admitted on 09/15/2023 /transferred from her facility/ [facility] with altered mental status.    Patient has been a resident of [facility] for 18 months.  Patient has had continued physical and functional decline over the past year.    Patient was found to be hypoxic with altered mental status. EMS reported her saturation was in the 70% range on room air on arrival.    Admitted with shock and acute encephalopathy secondary to multifocal pneumonia   Yesterday patient was able to make her own decisions and verbalize her wishes that focus of care from focus on comfort and dignity, patient's sister and multiple family members support patient's decision.    Subjective  Extensive chart review has been completed prior to meeting with patient/family  including labs, vital signs, imaging, progress/consult notes, orders, medications and available advance directive documents.    This NP assessed patient at the bedside as a follow up to  yesterday's GOCs meeting.  Patient's sister/Kristen [name]/main support person at the bedside  Patient is more alert today, with the assistance of nursing patient is able to enjoy her diet.  Today she was able to tolerate Ensure, Jell-O and other clear liquids.  Continued conversation regarding plan of care.  Patient reiterates her desire that comfort and dignity be the main focus of care, foregoing life-prolonging measures.  Patient verbalizes that she wants to ensure that her pain is  treated  Plan of care -DNR DNI -No artificial feeding or hydration now or in the future -Continue oral medications as tolerated, no IV medications -Symptom management to treat pain and discomfort -No further diagnostics -Comfort and dignity are focus of care--allowing for natural death -Discussion in the morning regarding next steps in transition of care--- likely back to SNF with hospice services.  Education offered on hospice benefit; philosophy and eligibility.  Patient remains eligible for hospice benefit with a prognosis of 6 months or less.  However I do not think she meets criteria for residential hospice.    I discussed this with both patient and her sister and discussed alternate plan of returning to skilled nursing facility with hospice benefits in place there.  Education offered the patient would likely be moved out of the ICU today to a MedSurg floor.  I informed family that patient would not be on a cardiac monitor.  She will continue with her oral medications as tolerated and oxygen as needed.  Education offered on the difference between a full medical support path and a palliative comfort path for this patient, at this time in this situation.  Education offered on the natural trajectory and expectations at end-of-life.  Education offered today regarding  the importance of continued conversation with family and their  medical providers regarding overall plan of care and treatment options,  ensuring decisions are within the context of the patients values and GOCs.  Questions and concerns addressed   Discussed with primary team and nursing staff  PMT will f/u in the morning  for discussion regarding next steps in transition of care   Time: 50  minutes  Detailed review of medical records ( labs, imaging, vital signs), medically appropriate exam ( MS, skin, cardiac,  resp)   discussed with treatment team, counseling and education to patient, family, staff, documenting  clinical information, medication management, coordination of care    Lorinda Creed NP  Palliative Medicine Team Team Phone # [phone] Pager [phone]

## 2023-09-17 NOTE — Progress Notes (Signed)
   CC:  Chief Complaint  Patient presents with   Altered Mental Status      Subjective:  Patient reports that she is feeling a bit better and hopes the antibiotics will allow he to discharge from the hospital. Patient continues to decline invasive procedures but is okay with oral medications.  Objective:  BP 102/72   Pulse (!) 117   Temp 97.6 F (36.4 C) (Axillary)   Resp 18   Ht 5\' 10"  (1.778 m)   Wt 56.3 kg   SpO2 95%   BMI 17.81 kg/m   General exam: Appears calm and comfortable Respiratory system: Clear to auscultation. Respiratory effort normal. Cardiovascular system: S1 & S2 heard, RRR. Gastrointestinal system: Abdomen is nondistended, soft and nontender. Normal bowel sounds heard. Central nervous system: Alert and oriented. Musculoskeletal: No edema. No calf tenderness Psychiatry: Judgement and insight appear normal. Mood & affect appropriate.   Assessment/Plan:  Septic shock Present on admission, requiring ICU admission and vasopressor support. Source presumed multifocal pneumonia versus UTI. Blood and urine cultures (canceled) obtained. Patient started empirically on Vancomycin, ceftriaxone and Azithromycin with transition to Linezolid, Ceftriaxone and doxycycline with final transition to Levaquin and doxycycline. Blood cultures with no growth to date  Multifocal pneumonia Noted opacities on initial chest x-ray. Patient started empirically on antibiotics and has transitioned to Levaquin and doxycycline.  Acute respiratory failure with hypoxia Secondary to multifocal pneumonia. Wean to room air as able.  Possible UTI Urinalysis suggests urinary infection. Urine culture obtained but canceled. Patient is on treatment for pneumonia, which should cover for UTI.  Demand ischemia Secondary to shock.  Chronic anemia Hemoglobin appears stable when compared to last available hemoglobin from 2022.  COPD Unclear severity -Continue Pulmicort and Duonebs  Paroxysmal  atrial fibrillation Noted. Not on rate or rhythm control. On Xarelto prior to admission.  Elbow swelling Present prior to admission. Patient is managed on steroids as an outpatient. Transitioned to prednisone 10 mg daily -Continue prednisone 10 mg daily  Goals of care Patient has decided on comfort measures, but continues to want antibiotics.   Jacquelin Hawking, MD [facility] 09/17/2023, 2:17 PM

## 2023-09-18 DIAGNOSIS — J9601 Acute respiratory failure with hypoxia: Secondary | ICD-10-CM | POA: Diagnosis not present

## 2023-09-18 DIAGNOSIS — Z515 Encounter for palliative care: Secondary | ICD-10-CM

## 2023-09-18 DIAGNOSIS — R531 Weakness: Secondary | ICD-10-CM | POA: Diagnosis not present

## 2023-09-18 DIAGNOSIS — J189 Pneumonia, unspecified organism: Secondary | ICD-10-CM | POA: Diagnosis not present

## 2023-09-18 DIAGNOSIS — R579 Shock, unspecified: Secondary | ICD-10-CM | POA: Diagnosis not present

## 2023-09-18 MED ORDER — MORPHINE SULFATE (CONCENTRATE) 10 MG/0.5ML PO SOLN: 5.0000 mg | Freq: Four times a day (QID) | ORAL | Status: DC | PRN

## 2023-09-18 MED ORDER — SIMETHICONE 80 MG PO CHEW: 80.0000 mg | CHEWABLE_TABLET | Freq: Four times a day (QID) | ORAL | Status: DC | PRN

## 2023-09-18 MED ORDER — OXYCODONE-ACETAMINOPHEN 5-325 MG PO TABS: 1.0000 | ORAL_TABLET | ORAL | 0 refills | Status: DC | PRN

## 2023-09-18 NOTE — Progress Notes (Signed)
Patient ID: Kristen [name], female   DOB: 1946/08/03, 77 y.o.   MRN: [id number]    Progress Note from the Palliative Medicine Team at [facility]   Patient Name: Kristen [name]        Date: 09/18/2023 DOB: [birth year]  Age: 77 y.o. MRN#: [id number] Attending Physician: Narda Bonds, MD Primary Care Physician: System, Provider Not In Admit Date: 09/15/2023   Reason for Consultation/Follow-up   Establishing Goals of Care   HPI/ Brief Hospital Review 77 y.o. female   admitted on 09/15/2023 /transferred from her facility/ [facility] with altered mental status.    Patient has been a resident of [facility] for 18 months.  Patient has had continued physical and functional decline over the past year.    Patient was found to be hypoxic with altered mental status. EMS reported her saturation was in the 70%    Admitted with shock and acute encephalopathy secondary to multifocal pneumonia   Patient was able to make her own decisions and verbalize her wishes that focus of care from focus on comfort and dignity, patient's sister and multiple family members support patient's decision.    Subjective  Extensive chart review has been completed prior to meeting with patient/family  including labs, vital signs, imaging, progress/consult notes, orders, medications and available advance directive documents.    This NP assessed patient at the bedside as a follow up for palliative medicine needs and emotional support.    Patient's sister/Kristen [name]/main support person at the bedside.  Patient's daughter/Kristen [name] at bedside today.   There is a niece at the bedside  Patient is more lethargic today, sleeping more, oral intake decreased.  Patient and family support current comfort path for focus of care.Marland Kitchen  Ultimately they are hopeful for residential hospice for end-of-life care.  Patient offered on residential hospice eligibility.   I will place referral for hospice liaison to review and deemed  eligibility.  If patient does not meet criteria for residential, education offered to family regarding transition to skilled nursing facility with hospice services in place there.   Plan of care -DNR DNI -No artificial feeding or hydration now or in the future -Continue oral medications as tolerated, no IV medications -Symptom management to treat pain, dyspnea and discomforts        -Percocet/ pain (this is patient's preferred method for treating her generalized pain) Roxanol (dyspnea) -No further diagnostics -Comfort and dignity are focus of care--allowing for natural death   Education offered on the natural trajectory and expectations at end-of-life.  Questions and concerns addressed   Discussed with primary team and transition of care  Time: 50  minutes  Detailed review of medical records ( labs, imaging, vital signs), medically appropriate exam ( MS, skin, cardiac,  resp)   discussed with treatment team, counseling and education to patient, family, staff, documenting clinical information, medication management, coordination of care    Lorinda Creed NP  Palliative Medicine Team Team Phone # [phone] Pager [phone]

## 2023-09-18 NOTE — Evaluation (Signed)
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  Admitted with respiratory failure and AMS - agitation.  Patient was made comfort care during hospitalization and has since woken up and SLP input indicated.  Past medical history significant for COPD requiring several prior thoracentesis.,  smoking, Diarrhea, COPD related cachexia, Elevated troponins,  pneumonia. CT had 10/23   No acute intracranial process.  2. 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  3. Complete opacification of the mastoid air cells and middle ear on  the right, possible effusion versus cholesteatoma.  CXR Hazy bilateral lung opacities, left greater than right, may  represent pneumonia, atelectasis, pleural effusion, or combination  there of.    Assessment / Plan / Recommendation  Clinical Impression  Pt presents with functional oropharyngeal swallow abiltiy based on clinical swallow evaluation.  She is weak currently and has tremorous arm movement and thus required being fed by SlP.  No focal CN deficits  and pt confirms her swallow ability is at her baseline.  She only consumed single small bite of moistened graham cracker, few boluses of applesauce and gingerale via straw *as well as meds given by RN, requesting to cease po at that time.  No clinical indication of airway compromise with this pt.  Swallow trigger appears mildly delayed but clear voice noted t/o. Pt reports she has h/o GERD but denies this currently.  Does endorse gas and nausea - that is worse when constipated (as is currently).  Reports her intake is poor due to both lack of appetite and lackluster food at her facility.  SLP educated her, her sister [name] and her daughter from [place] to compensation strategies for dysphagia mitigation, maximal comfort. Pt appears mentally sharp today and articulates that she has been offered pills with applesauce PRN and does not eat foods that she can't manage at [facility] *she reports to be on a regular diet there.  All education completed and family agreed to order ALL of her meals - avoiding hard meats, raw lettice, etc to prevent diet limitations.  Messaged MD re: pt's taking GasX daily. Thanks for this consult. SLP Visit Diagnosis: Dysphagia, unspecified (R13.10)    Aspiration Risk  Mild aspiration risk (with caution)    Diet Recommendation Regular;Thin liquid (for family to choose foods she can manage)    Liquid Administration via: Straw;Cup Medication Administration: Whole meds with liquid Supervision: Staff to assist with self feeding;Comment (family can help pt eat) Compensations: Slow rate;Small sips/bites Postural Changes: Seated upright at 90 degrees;Remain upright for at least 30 minutes after po intake  Other  Recommendations Oral Care Recommendations: Oral care BID    Recommendations for follow up therapy are one component of a multi-disciplinary discharge planning process, led by the attending physician.  Recommendations may be updated based on patient status, additional functional  criteria and insurance authorization.  Follow up Recommendations No SLP follow up      Assistance Recommended at Discharge    Functional Status Assessment Patient has had a recent decline in their functional status and/or demonstrates limited ability to make significant improvements in function in a reasonable and predictable amount of time  Frequency and Duration            Prognosis        Swallow Study   General Date of Onset: 09/18/23 HPI: 77 year old female admitted from [facility] skilled nursing facility where she has been for 18 months with progressive decline.  Admitted with respiratory failure and AMS - agitation.  Patient was made comfort care during hospitalization and has since woken up and SLP input indicated.  Past medical history significant for COPD requiring several prior thoracentesis.,  smoking, Diarrhea, COPD related cachexia, Elevated troponins,  pneumonia. CT had 10/23   No acute intracranial process.  2. 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  3. Complete opacification of the mastoid air cells and middle ear on  the right, possible effusion versus cholesteatoma.  CXR Hazy bilateral lung opacities, left greater than right, may  represent pneumonia, atelectasis, pleural effusion, or combination  there of. Type of Study: Bedside Swallow Evaluation Diet Prior to this Study: Thin liquids (Level 0) Temperature Spikes Noted: No Respiratory Status: Nasal cannula History of Recent Intubation: No Behavior/Cognition: Alert;Cooperative;Pleasant mood Oral Cavity Assessment: Within Functional Limits Oral Care Completed by SLP: No Oral Cavity - Dentition: Edentulous Vision: Impaired for self-feeding Self-Feeding Abilities: Needs assist Patient Positioning: Partially reclined Baseline Vocal Quality: Normal Volitional Cough: Weak Volitional Swallow: Able to elicit    Oral/Motor/Sensory Function Overall  Oral Motor/Sensory Function: Generalized oral weakness   Ice Chips Ice chips: Not tested   Thin Liquid Thin Liquid: Within functional limits Presentation: Straw    Nectar Thick Nectar Thick Liquid: Not tested   Honey Thick Honey Thick Liquid: Not tested   Puree Puree: Within functional limits Presentation: Spoon   Solid     Solid: Within functional limits Presentation: Spoon;Self Fed      Chales Abrahams 09/18/2023,10:14 AM  Rolena Infante, MS [facility] SLP Acute Rehab Services Office [phone]

## 2023-09-18 NOTE — Progress Notes (Addendum)
   CC:  Chief Complaint  Patient presents with   Altered Mental Status      Subjective: Feels better today. No specific concerns today.  Objective:  BP 104/69 (BP Location: Right Arm)   Pulse (!) 107   Temp 98.3 F (36.8 C) (Oral)   Resp 18   Ht 5\' 10"  (1.778 m)   Wt 56.3 kg   SpO2 96%   BMI 17.81 kg/m   General exam: Appears calm and comfortable Respiratory system: Respiratory effort normal. Central nervous system: Alert and oriented. Psychiatry: Judgement and insight appear normal.  Assessment/Plan:  Septic shock Present on admission, requiring ICU admission and vasopressor support. Source presumed multifocal pneumonia versus UTI. Blood and urine cultures (canceled) obtained. Patient started empirically on Vancomycin, ceftriaxone and Azithromycin with transition to Linezolid, Ceftriaxone and doxycycline with final transition to Levaquin and doxycycline. Blood cultures with no growth to date  Multifocal pneumonia Noted opacities on initial chest x-ray. Patient started empirically on antibiotics and has transitioned to Levaquin and doxycycline. -Continue Levaquin and doxycycline  Acute respiratory failure with hypoxia Secondary to multifocal pneumonia. Wean to room air as able.  Possible UTI Urinalysis suggests urinary infection. Urine culture obtained but canceled. Patient is on treatment for pneumonia, which should cover for UTI.  Demand ischemia Secondary to shock.  Chronic anemia Hemoglobin appears stable when compared to last available hemoglobin from 2022.  COPD Unclear severity -Continue Pulmicort and Duonebs  Paroxysmal atrial fibrillation Noted. Not on rate or rhythm control. On Xarelto prior to admission.  Elbow swelling Present prior to admission. Patient is managed on steroids as an outpatient. Transitioned to prednisone 10 mg daily -Continue prednisone 10 mg daily  Goals of care Patient has decided on comfort measures, but continues to want  antibiotics.  Disposition: Medically stable for discharge. Awaiting acceptance from [facility].   Jacquelin Hawking, MD [facility] 09/18/2023, 1:46 PM

## 2023-09-18 NOTE — Plan of Care (Signed)
  Problem: Pain Management: Goal: General experience of comfort will improve Outcome: Progressing

## 2023-09-18 NOTE — Progress Notes (Addendum)
[facility] Liaison Note  Referral received for patient/family interest in [facility]. Unfortunately, patient does not meet criteria for [facility].   Met with patient, patient's daughter,niece and sister at bedside. They are in agreement that she does not meet criteria and would like for her to return to [facility] with hospice services.   Plan is for discharge on Monday 10.28.24.   No DME needed at this time.   Please send comfort medications/prescriptions with patient at discharge.   Please give 1x dose of medication for pain and nausea prior to moving patient on Monday as family feels this will be beneficial for the EMS ride.   Please call with any questions or concerns. Thank you  Dionicio Stall, LCSW [facility] hospital liaison [phone]

## 2023-09-18 NOTE — TOC Progression Note (Addendum)
Transition of Care [facility]) - Progression Note    Patient Details  Name: Kristen [name] MRN: [id number] Date of Birth: 1946/06/30  Transition of Care [facility]) CM/SW Contact  Donnalee Curry, LCSWA Phone Number: 09/18/2023, 1:16 PM  Clinical Narrative:     SW informed by palliative care, pt/sister requesting to be reviewed for [facility].   SW spoke with pts sister [name] [phone]) confirmed choice is for [facility] and if pt does not meet criteria, will d/c to [facility] with Hospice services.   SW left VM for [facility] [phone])  Update 222pm SW spoke with [facility]) reports pt does not meet criteria for [facility] at this time. Plan to return to [facility] with Hospice, family in agreement.   SW left VM with Lawerance Cruel [facility] [phone]) to determine when they can accept pt back.      Social Determinants of Health (SDOH) Interventions SDOH Screenings   Food Insecurity: Patient Declined (09/16/2023)  Housing: Patient Declined (09/16/2023)  Transportation Needs: Patient Unable To Answer (09/16/2023)  Utilities: Patient Unable To Answer (09/16/2023)  Depression (PHQ2-9): Low Risk  (07/03/2021)  Tobacco Use: Medium Risk (09/15/2023)    Readmission Risk Interventions    06/17/2021    4:11 PM 06/02/2021    5:04 PM  Readmission Risk Prevention Plan  Transportation Screening Complete Complete  Medication Review (RN Care Manager)  Complete  PCP or Specialist appointment within 3-5 days of discharge  Complete  HRI or Home Care Consult  Complete  SW Recovery Care/Counseling Consult Complete Complete  Palliative Care Screening Complete Complete  Skilled Nursing Facility Complete Complete

## 2023-09-19 DIAGNOSIS — R579 Shock, unspecified: Secondary | ICD-10-CM | POA: Diagnosis not present

## 2023-09-19 DIAGNOSIS — J189 Pneumonia, unspecified organism: Secondary | ICD-10-CM | POA: Insufficient documentation

## 2023-09-19 MED ORDER — BUSPIRONE HCL 10 MG PO TABS: 10.0000 mg | ORAL_TABLET | Freq: Three times a day (TID) | ORAL | Status: AC

## 2023-09-19 MED ORDER — LINEZOLID 600 MG PO TABS: 600.0000 mg | ORAL_TABLET | Freq: Two times a day (BID) | ORAL | Status: AC

## 2023-09-19 MED ORDER — MORPHINE SULFATE (CONCENTRATE) 10 MG/0.5ML PO SOLN: 5.0000 mg | Freq: Four times a day (QID) | ORAL | 0 refills | Status: AC | PRN

## 2023-09-19 MED ORDER — MIRTAZAPINE 15 MG PO TABS: 15.0000 mg | ORAL_TABLET | Freq: Every day | ORAL | Status: AC

## 2023-09-19 MED ORDER — PANTOPRAZOLE SODIUM 40 MG PO TBEC: 40.0000 mg | DELAYED_RELEASE_TABLET | ORAL | Status: AC

## 2023-09-19 MED ORDER — PREDNISONE 10 MG PO TABS: 10.0000 mg | ORAL_TABLET | Freq: Every day | ORAL | Status: AC

## 2023-09-19 MED ORDER — LEVOFLOXACIN 750 MG PO TABS: 750.0000 mg | ORAL_TABLET | ORAL | Status: AC

## 2023-09-19 NOTE — Plan of Care (Signed)

## 2023-09-19 NOTE — TOC Progression Note (Signed)
Transition of Care [facility]) - Progression Note    Patient Details  Name: Kristen [name] MRN: [id number] Date of Birth: March 08, 1946  Transition of Care [facility]) CM/SW Contact  [address] [place], [place] Phone Number: 09/19/2023, 11:54 AM  Clinical Narrative:     CSW left VM with Lawerance Cruel [facility] [phone]) to confirm patient's return.  CSW awaiting return call.  Deaundra Kutzer, LCSW Transition of Care         Expected Discharge Plan and Services                                               Social Determinants of Health (SDOH) Interventions SDOH Screenings   Food Insecurity: Patient Declined (09/16/2023)  Housing: Patient Declined (09/16/2023)  Transportation Needs: Patient Unable To Answer (09/16/2023)  Utilities: Patient Unable To Answer (09/16/2023)  Depression (PHQ2-9): Low Risk  (07/03/2021)  Tobacco Use: Medium Risk (09/15/2023)    Readmission Risk Interventions    06/17/2021    4:11 PM 06/02/2021    5:04 PM  Readmission Risk Prevention Plan  Transportation Screening Complete Complete  Medication Review (RN Care Manager)  Complete  PCP or Specialist appointment within 3-5 days of discharge  Complete  HRI or Home Care Consult  Complete  SW Recovery Care/Counseling Consult Complete Complete  Palliative Care Screening Complete Complete  Skilled Nursing Facility Complete Complete

## 2023-09-19 NOTE — Discharge Summary (Signed)
Physician Discharge Summary   Patient: Kristen [name] MRN: [id number] DOB: [birth year]  Admit date:     09/15/2023  Discharge date: 09/19/23  Discharge Physician: Jacquelin Hawking, MD   PCP: System, Provider Not In   Recommendations at discharge:  Hospice care  Discharge Diagnoses: Principal Problem:   Shock [facility]) Active Problems:   Paroxysmal atrial fibrillation (HCC)   Acute on chronic respiratory failure with hypoxia [facility])   Hospice care patient   Multifocal pneumonia  Resolved Problems:   * No resolved hospital problems. *  Hospital Course: Kristen [name] is a 77 y.o. female with a history of COPD, paroxysmal atrial fibrillation.  Patient presented secondary to altered mental status and admitted for septic shock, requiring ICU admission and vasopressor support, with suspected multifocal pneumonia and a possible urinary tract infection as source. Empirical antibiotic therapy was initiated with Vancomycin, ceftriaxone, and Azithromycin, later transitioning to Linezolid, ceftriaxone, and doxycycline, and finally to Levaquin and Linezolid, with blood cultures showing no growth. Patient transitioned to comfort measures during hospitalization and discharge to long term care with hospice.  Assessment and Plan:  Septic shock Present on admission, requiring ICU admission and vasopressor support. Source presumed multifocal pneumonia versus UTI. Blood and urine cultures (canceled) obtained. Patient started empirically on Vancomycin, ceftriaxone and Azithromycin with transition to Linezolid, Ceftriaxone and doxycycline with final transition to Levaquin and Linezolid. Blood cultures with no growth to date  Multifocal pneumonia Noted opacities on initial chest x-ray. Patient started empirically on antibiotics and has transitioned to Levaquin and doxycycline. Continue Levaquin and doxycycline on discharge.   Acute on chronic respiratory failure with hypoxia Secondary to multifocal pneumonia. Weaned  to 3 L/min of oxygen for which patient states is baseline.   Possible UTI Urinalysis suggests urinary infection. Urine culture obtained but canceled. Patient is on treatment for pneumonia, which should cover for UTI.   Demand ischemia Secondary to shock.   Chronic anemia Hemoglobin appears stable when compared to last available hemoglobin from 2022.   COPD Unclear severity. Continue outpatient medication regimen.   Paroxysmal atrial fibrillation Noted. Not on rate or rhythm control. On Xarelto prior to admission. Continue based on goals of care.   Elbow swelling Present prior to admission. Patient is managed on steroids as an outpatient. Transitioned to prednisone 10 mg daily. Continue prednisone 10 mg daily for 4 more days.   Goals of care Patient has decided on comfort measures, but continues to want antibiotics.   Consultants: PCCM, Palliative care medicine Disposition: Long term care with hospice Diet recommendation: Regular diet  DISCHARGE MEDICATION: Allergies as of 09/19/2023       Reactions   Gabapentin Other (See Comments)   Hallucination/ nightmares- "Allergic," per [facility]        Medication List     STOP taking these medications    magnesium oxide 400 MG tablet Commonly known as: MAG-OX   oxyCODONE-acetaminophen 5-325 MG tablet Commonly known as: PERCOCET/ROXICET       TAKE these medications    albuterol 108 (90 Base) MCG/ACT inhaler Commonly known as: VENTOLIN HFA INHALE 1-2 PUFFS BY MOUTH EVERY 6 HOURS AS NEEDED FOR WHEEZE OR SHORTNESS OF BREATH What changed: See the new instructions.   amiodarone 200 MG tablet Commonly known as: PACERONE TAKE 1 TABLET BY MOUTH EVERY DAY   budesonide-formoterol 160-4.5 MCG/ACT inhaler Commonly known as: SYMBICORT Inhale 2 puffs into the lungs in the morning and at bedtime.   busPIRone 10 MG tablet Commonly known as: BUSPAR Take 1 tablet (  10 mg total) by mouth 3 (three) times daily. Start taking on:  September 22, 2023 What changed:  when to take this These instructions start on September 22, 2023. If you are unsure what to do until then, ask your doctor or other care provider.   carbamide peroxide 6.5 % OTIC solution Commonly known as: DEBROX Place 5 drops into both ears 2 (two) times daily.   Chloraseptic Sore Throat 6-10 MG lozenge Generic drug: benzocaine-menthol Take 1 lozenge by mouth 4 (four) times daily as needed for sore throat.   Dextran 70-Hypromellose 0.1-0.3 % Soln Apply 1 drop to eye in the morning, at noon, and at bedtime.   docusate sodium 100 MG capsule Commonly known as: COLACE Take 1 capsule (100 mg total) by mouth 2 (two) times daily as needed for mild constipation. What changed: when to take this   eucerin lotion Apply 1 Application topically in the morning and at bedtime.   feeding supplement Liqd Take 237 mLs by mouth 2 (two) times daily between meals.   ferrous sulfate 324 MG Tbec Take 324 mg by mouth 2 (two) times daily with a meal.   fluticasone 50 MCG/ACT nasal spray Commonly known as: FLONASE Place 1 spray into both nostrils daily as needed for allergies or rhinitis.   hydrOXYzine 50 MG tablet Commonly known as: ATARAX Take 50 mg by mouth 2 (two) times daily.   Incruse Ellipta 62.5 MCG/INH Aepb Generic drug: umeclidinium bromide INHALE 1 PUFF BY MOUTH EVERY DAY What changed:  how much to take how to take this when to take this additional instructions   ipratropium 0.03 % nasal spray Commonly known as: ATROVENT Place 1 spray into both nostrils daily.   levofloxacin 750 MG tablet Commonly known as: LEVAQUIN Take 1 tablet (750 mg total) by mouth every other day for 2 doses. Start taking on: September 20, 2023   levothyroxine 100 MCG tablet Commonly known as: SYNTHROID Take 100 mcg by mouth daily.   linezolid 600 MG tablet Commonly known as: ZYVOX Take 1 tablet (600 mg total) by mouth every 12 (twelve) hours for 3 days.   midodrine  5 MG tablet Commonly known as: PROAMATINE Take 1 tablet (5 mg total) by mouth 3 (three) times daily with meals.   mirtazapine 15 MG tablet Commonly known as: REMERON Take 1 tablet (15 mg total) by mouth at bedtime. Start taking on: September 22, 2023 What changed:  when to take this reasons to take this These instructions start on September 22, 2023. If you are unsure what to do until then, ask your doctor or other care provider.   montelukast 10 MG tablet Commonly known as: SINGULAIR Take 10 mg by mouth daily.   morphine CONCENTRATE 10 MG/0.5ML Soln concentrated solution Take 0.25 mLs (5 mg total) by mouth every 6 (six) hours as needed for shortness of breath or severe pain (pain score 7-10).   mupirocin ointment 2 % Commonly known as: BACTROBAN Apply 1 Application topically daily.   Nyamyc powder Generic drug: nystatin Apply 1 Application topically daily as needed (apply to sacrum, under breasts, and buttocks as needed).   ondansetron 4 MG disintegrating tablet Commonly known as: ZOFRAN-ODT Take 2 mg by mouth 2 (two) times daily as needed for nausea.   pantoprazole 40 MG tablet Commonly known as: PROTONIX Take 1 tablet (40 mg total) by mouth every other day.   polyethylene glycol 17 g packet Commonly known as: MIRALAX / GLYCOLAX Take 17 g by mouth daily as  needed for moderate constipation. What changed:  when to take this additional instructions   predniSONE 10 MG tablet Commonly known as: DELTASONE Take 1 tablet (10 mg total) by mouth daily with breakfast for 4 days. Start taking on: September 20, 2023 What changed:  medication strength how much to take when to take this   simethicone 80 MG chewable tablet Commonly known as: MYLICON Chew 80 mg by mouth daily as needed for flatulence.   triamcinolone 0.025 % ointment Commonly known as: KENALOG Apply 1 Application topically in the morning and at bedtime.   Vitamin D (Ergocalciferol) 1.25 MG (50000 UNIT) Caps  capsule Commonly known as: DRISDOL Take 50,000 Units by mouth once a week. Takes on Mondays   Xarelto 15 MG Tabs tablet Generic drug: Rivaroxaban TAKE 1 TABLET (15 MG TOTAL) BY MOUTH DAILY.        Discharge Exam: BP 111/71 (BP Location: Right Arm)   Pulse 87   Temp 98.7 F (37.1 C)   Resp 17   Ht 5\' 10"  (1.778 m)   Wt 56.3 kg   SpO2 98%   BMI 17.81 kg/m   General exam: Appears calm and comfortable.   Condition at discharge: Comfort measures  The results of significant diagnostics from this hospitalization (including imaging, microbiology, ancillary and laboratory) are listed below for reference.   Imaging Studies: [name] EKG SITE RITE  Result Date: 09/16/2023 If Site Rite image not attached, placement could not be confirmed due to current cardiac rhythm.  CT HEAD WO CONTRAST  Result Date: 09/15/2023 CLINICAL DATA:  Mental status change, unknown cause. EXAM: CT HEAD WITHOUT CONTRAST TECHNIQUE: Contiguous axial images were obtained from the base of the skull through the vertex without intravenous contrast. RADIATION DOSE REDUCTION: This exam was performed according to the departmental dose-optimization program which includes automated exposure control, adjustment of the mA and/or kV according to patient size and/or use of iterative reconstruction technique. COMPARISON:  None Available. FINDINGS: Brain: No acute intracranial hemorrhage, midline shift or mass effect. No extra-axial fluid collection. Mild periventricular white matter hypodensities are present bilaterally. No hydrocephalus. Hypodensity is noted in the right cerebellar hemisphere, possible old lacunar infarct. Vascular: No hyperdense vessel or unexpected calcification. Skull: Normal. Negative for fracture or focal lesion. Sinuses/Orbits: No acute finding. Other: There is a complete opacification of the mastoid air cells and middle ear on the right. IMPRESSION: 1. No acute intracranial process. 2. Mild chronic  microvascular ischemic changes and possible old lacunar infarct in the right cerebellar hemisphere. 3. Complete opacification of the mastoid air cells and middle ear on the right, possible effusion versus cholesteatoma. Electronically Signed   By: Thornell Sartorius M.D.   On: 09/15/2023 21:31   DG Chest Port 1 View  Result Date: 09/15/2023 CLINICAL DATA:  Altered mental status. EXAM: PORTABLE CHEST 1 VIEW COMPARISON:  10/24/2021 FINDINGS: Technically limited due to positioning. The heart is grossly normal in size. There are hazy bilateral lung opacities, left greater than right. The previous right upper lobe airspace disease has resolved. No pulmonary edema or large pneumothorax. More detailed assessment is limited. IMPRESSION: Hazy bilateral lung opacities, left greater than right, may represent pneumonia, atelectasis, pleural effusion, or combination there of. Electronically Signed   By: Narda Rutherford M.D.   On: 09/15/2023 21:12    Microbiology: Results for orders placed or performed during the hospital encounter of 09/15/23  Blood Culture (Routine X 2)     Status: None (Preliminary result)   Collection Time: 09/15/23  6:50 PM   Specimen: BLOOD RIGHT ARM  Result Value Ref Range Status   Specimen Description BLOOD RIGHT ARM  Final   Special Requests   Final    BOTTLES DRAWN AEROBIC AND ANAEROBIC Blood Culture results may not be optimal due to an excessive volume of blood received in culture bottles   Culture   Final    NO GROWTH 3 DAYS Performed at [facility], [address], [place], [place] [address]    Report Status PENDING  Incomplete  Blood Culture (Routine X 2)     Status: None (Preliminary result)   Collection Time: 09/15/23  7:04 PM   Specimen: BLOOD RIGHT ARM  Result Value Ref Range Status   Specimen Description BLOOD RIGHT ARM  Final   Special Requests   Final    BOTTLES DRAWN AEROBIC AND ANAEROBIC Blood Culture results may not be optimal due to an inadequate volume of  blood received in culture bottles   Culture   Final    NO GROWTH 3 DAYS Performed at [facility], [address] [place], [place] [address]    Report Status PENDING  Incomplete  Respiratory (~20 pathogens) panel by PCR     Status: None   Collection Time: 09/16/23 12:37 AM   Specimen: Nasopharyngeal Swab; Respiratory  Result Value Ref Range Status   Adenovirus NOT DETECTED NOT DETECTED Final   Coronavirus 229E NOT DETECTED NOT DETECTED Final    Comment: (NOTE) The Coronavirus on the Respiratory Panel, DOES NOT test for the novel  Coronavirus (2019 nCoV)    Coronavirus HKU1 NOT DETECTED NOT DETECTED Final   Coronavirus NL63 NOT DETECTED NOT DETECTED Final   Coronavirus OC43 NOT DETECTED NOT DETECTED Final   Metapneumovirus NOT DETECTED NOT DETECTED Final   Rhinovirus / Enterovirus NOT DETECTED NOT DETECTED Final   Influenza A NOT DETECTED NOT DETECTED Final   Influenza B NOT DETECTED NOT DETECTED Final   Parainfluenza Virus 1 NOT DETECTED NOT DETECTED Final   Parainfluenza Virus 2 NOT DETECTED NOT DETECTED Final   Parainfluenza Virus 3 NOT DETECTED NOT DETECTED Final   Parainfluenza Virus 4 NOT DETECTED NOT DETECTED Final   Respiratory Syncytial Virus NOT DETECTED NOT DETECTED Final   Bordetella pertussis NOT DETECTED NOT DETECTED Final   Bordetella Parapertussis NOT DETECTED NOT DETECTED Final   Chlamydophila pneumoniae NOT DETECTED NOT DETECTED Final   Mycoplasma pneumoniae NOT DETECTED NOT DETECTED Final    Comment: Performed at [facility], [address] [place], [place] [address]  Resp panel by RT-PCR (RSV, Flu A&B, Covid) Nasopharyngeal Swab     Status: None   Collection Time: 09/16/23 12:37 AM   Specimen: Nasopharyngeal Swab; Nasal Swab  Result Value Ref Range Status   SARS Coronavirus 2 by RT PCR NEGATIVE NEGATIVE Final   Influenza A by PCR NEGATIVE NEGATIVE Final   Influenza B by PCR NEGATIVE NEGATIVE Final    Comment: (NOTE) The Xpert Xpress  SARS-CoV-2/FLU/RSV plus assay is intended as an aid in the diagnosis of influenza from Nasopharyngeal swab specimens and should not be used as a sole basis for treatment. Nasal washings and aspirates are unacceptable for Xpert Xpress SARS-CoV-2/FLU/RSV testing.  Fact Sheet for Patients: BloggerCourse.com  Fact Sheet for Healthcare Providers: SeriousBroker.it  This test is not yet approved or cleared by the [place] FDA and has been authorized for detection and/or diagnosis of SARS-CoV-2 by FDA under an Emergency Use Authorization (EUA). This EUA will  remain in effect (meaning this test can be used) for the duration of the COVID-19 declaration under Section 564(b)(1) of the Act, 21 U.S.C. section 360bbb-3(b)(1), unless the authorization is terminated or revoked.     Resp Syncytial Virus by PCR NEGATIVE NEGATIVE Final    Comment: (NOTE) Fact Sheet for Patients: BloggerCourse.com  Fact Sheet for Healthcare Providers: SeriousBroker.it  This test is not yet approved or cleared by the [place] FDA and has been authorized for detection and/or diagnosis of SARS-CoV-2 by FDA under an Emergency Use Authorization (EUA). This EUA will remain in effect (meaning this test can be used) for the duration of the COVID-19 declaration under Section 564(b)(1) of the Act, 21 U.S.C. section 360bbb-3(b)(1), unless the authorization is terminated or revoked.  Performed at [facility], [address] [place], [place] [address]   MRSA Next Gen by PCR, Nasal     Status: Abnormal   Collection Time: 09/16/23  1:47 AM   Specimen: Nasal Mucosa; Nasal Swab  Result Value Ref Range Status   MRSA by PCR Next Gen DETECTED (A) NOT DETECTED Final    Comment: RESULT CALLED TO, READ BACK BY AND VERIFIED WITH: R TARWATER,RN@[id number]  09/16/23 MK (NOTE) The GeneXpert MRSA Assay (FDA approved for NASAL  specimens only), is one component of a comprehensive MRSA colonization surveillance program. It is not intended to diagnose MRSA infection nor to guide or monitor treatment for MRSA infections. Test performance is not FDA approved in patients less than 59 years old. Performed at [facility], [address], [place], [place] [address]     Labs: CBC: Recent Labs  Lab 09/15/23 1850 09/15/23 1859 09/16/23 0316  WBC 14.4*  --  15.8*  NEUTROABS 11.9*  --  13.7*  HGB 8.7* 10.2*  10.2* 8.4*  HCT 30.4* 30.0*  30.0* 26.5*  MCV 105.6*  --  95.3  PLT 445*  --  365   Basic Metabolic Panel: Recent Labs  Lab 09/15/23 1850 09/15/23 1859 09/16/23 0036  NA 140 140  140 139  K 3.7 3.7  3.7 3.9  CL 110 110 110  CO2 15*  --  13*  GLUCOSE 118* 116* 104*  BUN 14 15 12   CREATININE 1.13* 0.80 1.04*  CALCIUM 7.7*  --  7.4*  MG 1.9  --  1.7  PHOS  --   --  2.9   Liver Function Tests: Recent Labs  Lab 09/15/23 1850 09/16/23 0036  AST 51* 49*  ALT 28 28  ALKPHOS 239* 211*  BILITOT 0.8 0.6  PROT 4.5* 3.9*  ALBUMIN <1.5* <1.5*   CBG: Recent Labs  Lab 09/15/23 1849 09/16/23 0025 09/16/23 0315 09/16/23 0817 09/16/23 1141  GLUCAP 119* 95 114* 114* 113*    Discharge time spent: 35 minutes.  Signed: Jacquelin Hawking, MD [facility] 09/19/2023

## 2023-09-19 NOTE — TOC Progression Note (Addendum)
Transition of Care [facility]) - Progression Note    Patient Details  Name: Kristen [name] MRN: [id number] Date of Birth: September 10, 1946  Transition of Care [facility] CM/SW Contact  [address] Babita Amaker [place], [place] Phone Number: 09/19/2023, 12:53 PM  Clinical Narrative:     [facility] contacted, spoke with Morrie Sheldon RN who confirmed patient's return. Patient to return to room 302A. RN to call report to [phone] Morrie Sheldon)  Patient to be transported back to the facility by [facility].  2:42pm  patient's sister informed of plan for patient to return to [facility] today by [facility].  Cattleya Dobratz, LCSW Transition of Care         Expected Discharge Plan and Services         Expected Discharge Date: 09/19/23                                     Social Determinants of Health (SDOH) Interventions SDOH Screenings   Food Insecurity: Patient Declined (09/16/2023)  Housing: Patient Declined (09/16/2023)  Transportation Needs: Patient Unable To Answer (09/16/2023)  Utilities: Patient Unable To Answer (09/16/2023)  Depression (PHQ2-9): Low Risk  (07/03/2021)  Tobacco Use: Medium Risk (09/15/2023)    Readmission Risk Interventions    06/17/2021    4:11 PM 06/02/2021    5:04 PM  Readmission Risk Prevention Plan  Transportation Screening Complete Complete  Medication Review (RN Care Manager)  Complete  PCP or Specialist appointment within 3-5 days of discharge  Complete  HRI or Home Care Consult  Complete  SW Recovery Care/Counseling Consult Complete Complete  Palliative Care Screening Complete Complete  Skilled Nursing Facility Complete Complete

## 2023-09-19 NOTE — Progress Notes (Signed)
[facility] [phone] [facility] - Inpatient Liaison Note  [facility] liaison team continues to follow for discharge disposition.  Anticipate discharge today/tomorrow to [facility] once bed confirmed.  Please call with any hospice related questions or concerns.  Doreatha Martin, RN, Stage manager [phone]

## 2023-09-19 NOTE — Progress Notes (Incomplete)
   CC:  Chief Complaint  Patient presents with   Altered Mental Status      Subjective: Feels better today. No specific concerns today.  Objective:  BP 111/71 (BP Location: Right Arm)   Pulse 87   Temp 98.7 F (37.1 C)   Resp 17   Ht 5\' 10"  (1.778 m)   Wt 56.3 kg   SpO2 98%   BMI 17.81 kg/m   General exam: Appears calm and comfortable Respiratory system: Respiratory effort normal. Central nervous system: Alert and oriented. Psychiatry: Judgement and insight appear normal.  Assessment/Plan:  Septic shock Present on admission, requiring ICU admission and vasopressor support. Source presumed multifocal pneumonia versus UTI. Blood and urine cultures (canceled) obtained. Patient started empirically on Vancomycin, ceftriaxone and Azithromycin with transition to Linezolid, Ceftriaxone and doxycycline with final transition to Levaquin and doxycycline. Blood cultures with no growth to date  Multifocal pneumonia Noted opacities on initial chest x-ray. Patient started empirically on antibiotics and has transitioned to Levaquin and doxycycline. -Continue Levaquin and doxycycline  Acute respiratory failure with hypoxia Secondary to multifocal pneumonia. Wean to room air as able.  Possible UTI Urinalysis suggests urinary infection. Urine culture obtained but canceled. Patient is on treatment for pneumonia, which should cover for UTI.  Demand ischemia Secondary to shock.  Chronic anemia Hemoglobin appears stable when compared to last available hemoglobin from 2022.  COPD Unclear severity -Continue Pulmicort and Duonebs  Paroxysmal atrial fibrillation Noted. Not on rate or rhythm control. On Xarelto prior to admission.  Elbow swelling Present prior to admission. Patient is managed on steroids as an outpatient. Transitioned to prednisone 10 mg daily -Continue prednisone 10 mg daily  Goals of care Patient has decided on comfort measures, but continues to want  antibiotics.  Disposition: Medically stable for discharge. Awaiting acceptance from [facility]   Jacquelin Hawking, MD [facility] 09/19/2023, 8:39 AM

## 2023-09-19 NOTE — Progress Notes (Signed)
Patient being discharged to [facility]. Called report to [place], LPN. Patient transported via [facility]. Dressing changes performed to BLE and chin. Patient on 3 L [place] chronic. Prn for pain administered prior to transfer. Transfer packet includes DNR, printed Morphine rx, medical necessity, and transfer form. Sister called to notify that patient was transferred. Patient left the unit in NAD.

## 2023-09-19 NOTE — Hospital Course (Signed)
Kristen [name] is a 77 y.o. female with a history of COPD, paroxysmal atrial fibrillation.  Patient presented secondary to altered mental status and admitted for septic shock, requiring ICU admission and vasopressor support, with suspected multifocal pneumonia and a possible urinary tract infection as source. Empirical antibiotic therapy was initiated with Vancomycin, ceftriaxone, and Azithromycin, later transitioning to Linezolid, ceftriaxone, and doxycycline, and finally to Levaquin and Linezolid, with blood cultures showing no growth. Patient transitioned to comfort measures during hospitalization and discharge to long term care with hospice.

## 2023-09-20 LAB — CULTURE, BLOOD (ROUTINE X 2)
Culture: NO GROWTH
Culture: NO GROWTH

## 2023-10-24 DEATH — deceased
